# Patient Record
Sex: Male | Born: 1940
Health system: Southern US, Community
[De-identification: ages and names within clinical notes are randomized; demographics above are authoritative.]

## PROBLEM LIST (undated history)

## (undated) DIAGNOSIS — M1990 Unspecified osteoarthritis, unspecified site: Secondary | ICD-10-CM

## (undated) DIAGNOSIS — F419 Anxiety disorder, unspecified: Secondary | ICD-10-CM

## (undated) DIAGNOSIS — K635 Polyp of colon: Secondary | ICD-10-CM

## (undated) DIAGNOSIS — A419 Sepsis, unspecified organism: Secondary | ICD-10-CM

## (undated) DIAGNOSIS — K649 Unspecified hemorrhoids: Secondary | ICD-10-CM

## (undated) DIAGNOSIS — K219 Gastro-esophageal reflux disease without esophagitis: Secondary | ICD-10-CM

## (undated) DIAGNOSIS — N390 Urinary tract infection, site not specified: Secondary | ICD-10-CM

## (undated) DIAGNOSIS — C4490 Unspecified malignant neoplasm of skin, unspecified: Secondary | ICD-10-CM

## (undated) DIAGNOSIS — D649 Anemia, unspecified: Secondary | ICD-10-CM

## (undated) DIAGNOSIS — I2510 Atherosclerotic heart disease of native coronary artery without angina pectoris: Secondary | ICD-10-CM

## (undated) DIAGNOSIS — R0600 Dyspnea, unspecified: Secondary | ICD-10-CM

## (undated) DIAGNOSIS — G709 Myoneural disorder, unspecified: Secondary | ICD-10-CM

## (undated) DIAGNOSIS — I4891 Unspecified atrial fibrillation: Secondary | ICD-10-CM

## (undated) DIAGNOSIS — E119 Type 2 diabetes mellitus without complications: Secondary | ICD-10-CM

## (undated) DIAGNOSIS — H9319 Tinnitus, unspecified ear: Secondary | ICD-10-CM

## (undated) DIAGNOSIS — F329 Major depressive disorder, single episode, unspecified: Secondary | ICD-10-CM

## (undated) DIAGNOSIS — C801 Malignant (primary) neoplasm, unspecified: Secondary | ICD-10-CM

## (undated) DIAGNOSIS — E785 Hyperlipidemia, unspecified: Secondary | ICD-10-CM

## (undated) DIAGNOSIS — F32A Depression, unspecified: Secondary | ICD-10-CM

## (undated) DIAGNOSIS — B019 Varicella without complication: Secondary | ICD-10-CM

## (undated) DIAGNOSIS — I509 Heart failure, unspecified: Secondary | ICD-10-CM

## (undated) DIAGNOSIS — K5730 Diverticulosis of large intestine without perforation or abscess without bleeding: Secondary | ICD-10-CM

## (undated) DIAGNOSIS — R011 Cardiac murmur, unspecified: Secondary | ICD-10-CM

## (undated) DIAGNOSIS — K3189 Other diseases of stomach and duodenum: Secondary | ICD-10-CM

## (undated) DIAGNOSIS — I10 Essential (primary) hypertension: Secondary | ICD-10-CM

## (undated) DIAGNOSIS — G4730 Sleep apnea, unspecified: Secondary | ICD-10-CM

## (undated) HISTORY — DX: Other diseases of stomach and duodenum: K31.89

## (undated) HISTORY — DX: Major depressive disorder, single episode, unspecified: F32.9

## (undated) HISTORY — DX: Varicella without complication: B01.9

## (undated) HISTORY — PX: MELANOMA EXCISION: SHX5266

## (undated) HISTORY — DX: Essential (primary) hypertension: I10

## (undated) HISTORY — DX: Hyperlipidemia, unspecified: E78.5

## (undated) HISTORY — PX: CARDIAC CATHETERIZATION: SHX172

## (undated) HISTORY — DX: Anemia, unspecified: D64.9

## (undated) HISTORY — DX: Unspecified hemorrhoids: K64.9

## (undated) HISTORY — DX: Unspecified malignant neoplasm of skin, unspecified: C44.90

## (undated) HISTORY — DX: Malignant (primary) neoplasm, unspecified: C80.1

## (undated) HISTORY — DX: Urinary tract infection, site not specified: N39.0

## (undated) HISTORY — DX: Tinnitus, unspecified ear: H93.19

## (undated) HISTORY — PX: SPINE SURGERY: SHX786

## (undated) HISTORY — DX: Polyp of colon: K63.5

## (undated) HISTORY — DX: Depression, unspecified: F32.A

## (undated) HISTORY — DX: Anxiety disorder, unspecified: F41.9

## (undated) HISTORY — DX: Type 2 diabetes mellitus without complications: E11.9

## (undated) HISTORY — DX: Diverticulosis of large intestine without perforation or abscess without bleeding: K57.30

## (undated) HISTORY — PX: EYE SURGERY: SHX253

## (undated) HISTORY — DX: Unspecified osteoarthritis, unspecified site: M19.90

## (undated) HISTORY — DX: Myoneural disorder, unspecified: G70.9

## (undated) HISTORY — DX: Cardiac murmur, unspecified: R01.1

## (undated) HISTORY — DX: Heart failure, unspecified: I50.9

## (undated) HISTORY — PX: VASECTOMY: SHX75

---

## 2011-12-06 ENCOUNTER — Ambulatory Visit: Payer: Self-pay | Admitting: Internal Medicine

## 2011-12-10 ENCOUNTER — Ambulatory Visit (INDEPENDENT_AMBULATORY_CARE_PROVIDER_SITE_OTHER): Payer: Medicare Other | Admitting: Internal Medicine

## 2011-12-10 ENCOUNTER — Encounter: Payer: Self-pay | Admitting: Internal Medicine

## 2011-12-10 DIAGNOSIS — E119 Type 2 diabetes mellitus without complications: Secondary | ICD-10-CM | POA: Diagnosis not present

## 2011-12-10 DIAGNOSIS — F329 Major depressive disorder, single episode, unspecified: Secondary | ICD-10-CM

## 2011-12-10 DIAGNOSIS — I10 Essential (primary) hypertension: Secondary | ICD-10-CM | POA: Diagnosis not present

## 2011-12-10 DIAGNOSIS — I724 Aneurysm of artery of lower extremity: Secondary | ICD-10-CM | POA: Diagnosis not present

## 2011-12-10 DIAGNOSIS — E1169 Type 2 diabetes mellitus with other specified complication: Secondary | ICD-10-CM | POA: Insufficient documentation

## 2011-12-10 DIAGNOSIS — I2510 Atherosclerotic heart disease of native coronary artery without angina pectoris: Secondary | ICD-10-CM | POA: Diagnosis not present

## 2011-12-10 DIAGNOSIS — K219 Gastro-esophageal reflux disease without esophagitis: Secondary | ICD-10-CM | POA: Diagnosis not present

## 2011-12-10 DIAGNOSIS — F3289 Other specified depressive episodes: Secondary | ICD-10-CM

## 2011-12-10 DIAGNOSIS — F32A Depression, unspecified: Secondary | ICD-10-CM

## 2011-12-10 DIAGNOSIS — F325 Major depressive disorder, single episode, in full remission: Secondary | ICD-10-CM | POA: Insufficient documentation

## 2011-12-10 DIAGNOSIS — F339 Major depressive disorder, recurrent, unspecified: Secondary | ICD-10-CM | POA: Insufficient documentation

## 2011-12-10 DIAGNOSIS — Z86010 Personal history of colonic polyps: Secondary | ICD-10-CM

## 2011-12-10 DIAGNOSIS — C439 Malignant melanoma of skin, unspecified: Secondary | ICD-10-CM

## 2011-12-10 DIAGNOSIS — Z85820 Personal history of malignant melanoma of skin: Secondary | ICD-10-CM | POA: Insufficient documentation

## 2011-12-10 DIAGNOSIS — E785 Hyperlipidemia, unspecified: Secondary | ICD-10-CM

## 2011-12-10 DIAGNOSIS — Z0000 Encounter for general adult medical examination without abnormal findings: Secondary | ICD-10-CM

## 2011-12-10 LAB — LIPID PANEL
HDL: 32.4 mg/dL — ABNORMAL LOW (ref >39.00)
Triglycerides: 231 mg/dL — ABNORMAL HIGH (ref 0.0–149.0)

## 2011-12-10 LAB — CBC WITH DIFFERENTIAL/PLATELET
Eosinophils Relative: 2.7 % (ref 0.0–5.0)
HCT: 46.5 % (ref 39.0–52.0)
Hemoglobin: 15.3 g/dL (ref 13.0–17.0)
Lymphs Abs: 1.1 10*3/uL (ref 0.7–4.0)
Monocytes Relative: 8.4 % (ref 3.0–12.0)
Neutro Abs: 4.5 10*3/uL (ref 1.4–7.7)
WBC: 6.4 10*3/uL (ref 4.5–10.5)

## 2011-12-10 LAB — TSH: TSH: 0.8 u[IU]/mL (ref 0.35–5.50)

## 2011-12-10 LAB — COMPREHENSIVE METABOLIC PANEL
Alkaline Phosphatase: 56 U/L (ref 39–117)
BUN: 20 mg/dL (ref 6–23)
Glucose, Bld: 99 mg/dL (ref 70–99)
Total Bilirubin: 0.4 mg/dL (ref 0.3–1.2)

## 2011-12-10 LAB — HEMOGLOBIN A1C: Hgb A1c MFr Bld: 6.4 % (ref 4.6–6.5)

## 2011-12-10 MED ORDER — CLOPIDOGREL BISULFATE 75 MG PO TABS: 75.0000 mg | ORAL_TABLET | Freq: Every day | ORAL | Status: DC

## 2011-12-10 MED ORDER — QUINAPRIL HCL 40 MG PO TABS: 40.0000 mg | ORAL_TABLET | Freq: Every day | ORAL | Status: DC

## 2011-12-10 MED ORDER — METFORMIN HCL 500 MG PO TABS: 500.0000 mg | ORAL_TABLET | Freq: Every day | ORAL | Status: DC

## 2011-12-10 MED ORDER — ESOMEPRAZOLE MAGNESIUM 40 MG PO CPDR: 40.0000 mg | DELAYED_RELEASE_CAPSULE | Freq: Every day | ORAL | Status: DC

## 2011-12-10 MED ORDER — PITAVASTATIN CALCIUM 4 MG PO TABS: 4.0000 mg | ORAL_TABLET | Freq: Every day | ORAL | Status: DC

## 2011-12-10 MED ORDER — METOPROLOL SUCCINATE ER 25 MG PO TB24: 25.0000 mg | ORAL_TABLET | Freq: Every day | ORAL | Status: DC

## 2011-12-10 NOTE — Progress Notes (Signed)
Subjective:    Patient ID: Brent Taylor, male    DOB: 01-Mar-1941, 71 y.o.   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 He has a history of coronary artery disease. Apparently due to multiple risk factors he underwent a stress test some years ago which led to a heart catheterization. He was told that he had a vessel with moderate blockage but did not require any mechanical intervention. He has no history of chest pain. Cardiac risk factors include hypertension but he has had for approximately 40 years. He has been on metformin for 2 years for type 2 diabetes. He states that he is followed annually due to a left popliteal artery aneurysm. He denies any claudication. In 2000 he underwent a left mastectomy for a melanoma and has been seen in dermatology annually. He has a history of colonic polyps and his last colonoscopy was in 2001. He has gastroesophageal reflux disease which has been contacted by stricture formation requiring dilatation on 2 or 3 prior occasions. He has a history depression and has been on Prozac for in excess of 10 years. He has never had any trial off this medication.  Social history married 2 children and 4 grandchildren retired Optician, dispensing nonsmoker masters degree level of education Family history father died age 38 he should tobacco use status post CABG Mother died age 55 history of hypertension    Review of Systems  Constitutional: Negative for fever, chills, appetite change and fatigue.  HENT: Negative for hearing loss, ear pain, congestion, sore throat, trouble swallowing, neck stiffness, dental problem, voice change and tinnitus.   Eyes: Negative for pain, discharge and visual disturbance.  Respiratory: Negative for cough, chest tightness, wheezing and stridor.   Cardiovascular: Negative for chest pain, palpitations and leg swelling.  Gastrointestinal: Negative for  nausea, vomiting, abdominal pain, diarrhea, constipation, blood in stool and abdominal distention.  Genitourinary: Negative for urgency, hematuria, flank pain, discharge, difficulty urinating and genital sores.  Musculoskeletal: Negative for myalgias, back pain, joint swelling, arthralgias and gait problem.  Skin: Negative for rash.  Neurological: Negative for dizziness, syncope, speech difficulty, weakness, numbness and headaches.  Hematological: Negative for adenopathy. Does not bruise/bleed easily.  Psychiatric/Behavioral: Negative for behavioral problems and dysphoric mood. The patient is not nervous/anxious.        Objective:   Physical Exam  Constitutional: He appears well-developed and well-nourished.  HENT:  Head: Normocephalic and atraumatic.  Right Ear: External ear normal.  Left Ear: External ear normal.  Nose: Nose normal.  Mouth/Throat: Oropharynx is clear and moist.  Eyes: Conjunctivae and EOM are normal. Pupils are equal, round, and reactive to light. No scleral icterus.  Neck: Normal range of motion. Neck supple. No JVD present. No thyromegaly present.  Cardiovascular: Regular rhythm, normal heart sounds and intact distal pulses.  Exam reveals no gallop and no friction rub.   No murmur heard.      Dorsalis pedis pulses were nonpalpable but posterior tibial pulses were full bilaterally  Pulmonary/Chest: Effort normal and breath sounds normal. He exhibits no tenderness.       Status post left mastectomy  Abdominal: Soft. Bowel sounds are normal. He exhibits no distension and no mass. There is no tenderness.  Genitourinary: Prostate normal and penis normal. Guaiac negative stool.       Prostate +2 enlarged  Musculoskeletal: Normal range of motion. He exhibits no edema and no tenderness.  Lymphadenopathy:  He has no cervical adenopathy.  Neurological: He is alert. He has normal reflexes. No cranial nerve deficit. Coordination normal.  Skin: Skin is warm and dry. No rash  noted.  Psychiatric: He has a normal mood and affect. His behavior is normal.          Assessment & Plan:   Preventive health examination Coronary artery disease. Continue aggressive risk factor modification Hypertension well controlled Type 2 diabetes. We'll check a hemoglobin A1c Dyslipidemia Colonic polyps Gastroesophageal reflux disease with stricture formation. Continue PPI medication   Recheck 3 months weight loss low salt diet and exercise are encouraged

## 2011-12-10 NOTE — Patient Instructions (Signed)
Please check your hemoglobin A1c every 3 months  Limit your sodium (Salt) intake    It is important that you exercise regularly, at least 20 minutes 3 to 4 times per week.  If you develop chest pain or shortness of breath seek  medical attention.  You need to lose weight.  Consider a lower calorie diet and regular exercise.  Avoids foods high in acid such as tomatoes citrus juices, and spicy foods.  Avoid eating within two hours of lying down or before exercising.  Do not overheat.  Try smaller more frequent meals.  If symptoms persist, elevate the head of her bed 12 inches while sleeping.

## 2011-12-14 ENCOUNTER — Encounter: Payer: Self-pay | Admitting: Internal Medicine

## 2011-12-24 ENCOUNTER — Other Ambulatory Visit: Payer: Self-pay

## 2011-12-24 MED ORDER — PITAVASTATIN CALCIUM 4 MG PO TABS: 4.0000 mg | ORAL_TABLET | Freq: Every day | ORAL | Status: DC

## 2011-12-29 ENCOUNTER — Telehealth: Payer: Self-pay

## 2011-12-29 NOTE — Telephone Encounter (Signed)
Pt called - we are finished with recored recv'd from [facility] in [place]. - he will come by and pick up - ok per dr. Amador Cunas for pt to have. KIK

## 2012-01-21 ENCOUNTER — Telehealth: Payer: Self-pay | Admitting: *Deleted

## 2012-01-21 NOTE — Telephone Encounter (Signed)
Noted  

## 2012-01-21 NOTE — Telephone Encounter (Signed)
Pt has history of melanoma, and has found a new mole. We could not get an appt with Derm for 3 months, and wife wants Dr Kirtland Bouchard to look at it and see if he can biopsy it or get an earlier appt with Dermatology. Appt scheduled Monday.

## 2012-01-24 ENCOUNTER — Ambulatory Visit (INDEPENDENT_AMBULATORY_CARE_PROVIDER_SITE_OTHER): Payer: Medicare Other | Admitting: Internal Medicine

## 2012-01-24 ENCOUNTER — Encounter: Payer: Self-pay | Admitting: Internal Medicine

## 2012-01-24 VITALS — BP 130/90 | Temp 98.1°F | Wt 250.0 lb

## 2012-01-24 DIAGNOSIS — E119 Type 2 diabetes mellitus without complications: Secondary | ICD-10-CM

## 2012-01-24 DIAGNOSIS — C439 Malignant melanoma of skin, unspecified: Secondary | ICD-10-CM

## 2012-01-24 DIAGNOSIS — I2510 Atherosclerotic heart disease of native coronary artery without angina pectoris: Secondary | ICD-10-CM

## 2012-01-24 MED ORDER — METOPROLOL SUCCINATE ER 25 MG PO TB24: 25.0000 mg | ORAL_TABLET | Freq: Every day | ORAL | Status: DC

## 2012-01-24 MED ORDER — ESOMEPRAZOLE MAGNESIUM 40 MG PO CPDR: 40.0000 mg | DELAYED_RELEASE_CAPSULE | Freq: Every day | ORAL | Status: DC

## 2012-01-24 MED ORDER — METFORMIN HCL 500 MG PO TABS: 500.0000 mg | ORAL_TABLET | Freq: Every day | ORAL | Status: DC

## 2012-01-24 MED ORDER — PITAVASTATIN CALCIUM 4 MG PO TABS: 4.0000 mg | ORAL_TABLET | Freq: Every day | ORAL | Status: DC

## 2012-01-24 MED ORDER — CLOPIDOGREL BISULFATE 75 MG PO TABS: 75.0000 mg | ORAL_TABLET | Freq: Every day | ORAL | Status: DC

## 2012-01-24 MED ORDER — FLUOXETINE HCL 20 MG PO CAPS: 20.0000 mg | ORAL_CAPSULE | Freq: Every day | ORAL | Status: DC

## 2012-01-24 MED ORDER — QUINAPRIL HCL 40 MG PO TABS: 40.0000 mg | ORAL_TABLET | Freq: Every day | ORAL | Status: DC

## 2012-01-24 NOTE — Progress Notes (Signed)
  Subjective:    Patient ID: Brent Taylor, male    DOB: Oct 27, 1940, 71 y.o.   MRN: [id number]  HPI  71 year old patient who has a prior history of melanoma involving the left anterior chest wall. His wife was concerned about a lesion involving his left posterior back. He is scheduled for a new dermatology evaluation in July of this year. He has coronary artery disease which has been stable. He is treated hypertension and a history of type 2 diabetes. Last hemoglobin A1c 6.4. He has treated dyslipidemia     Review of Systems  Skin: Positive for rash.       Objective:   Physical Exam  Constitutional:       Blood pressure 130/90  Skin: Rash noted.       A slightly raised lesion 4 x 6 mm slightly oval that appeared consistent with a seborrheic dermatosis          Assessment & Plan:  Hypertension Coronary artery disease History melanoma. No clinical suspicion at this time will follow with dermatology in July as scheduled

## 2012-01-24 NOTE — Patient Instructions (Signed)
Dermatology followup as scheduled Return here as scheduled or as needed

## 2012-03-14 ENCOUNTER — Encounter: Payer: Self-pay | Admitting: Internal Medicine

## 2012-03-14 ENCOUNTER — Ambulatory Visit (INDEPENDENT_AMBULATORY_CARE_PROVIDER_SITE_OTHER): Payer: Medicare Other | Admitting: Internal Medicine

## 2012-03-14 VITALS — BP 140/90 | Temp 98.0°F | Wt 250.0 lb

## 2012-03-14 DIAGNOSIS — E119 Type 2 diabetes mellitus without complications: Secondary | ICD-10-CM | POA: Diagnosis not present

## 2012-03-14 DIAGNOSIS — I10 Essential (primary) hypertension: Secondary | ICD-10-CM

## 2012-03-14 DIAGNOSIS — K219 Gastro-esophageal reflux disease without esophagitis: Secondary | ICD-10-CM | POA: Diagnosis not present

## 2012-03-14 DIAGNOSIS — E785 Hyperlipidemia, unspecified: Secondary | ICD-10-CM

## 2012-03-14 DIAGNOSIS — I2510 Atherosclerotic heart disease of native coronary artery without angina pectoris: Secondary | ICD-10-CM | POA: Diagnosis not present

## 2012-03-14 NOTE — Progress Notes (Signed)
  Subjective:    Patient ID: Brent Taylor, male    DOB: April 19, 1941, 71 y.o.   MRN: [id number]  HPI  71 year old patient who is seen today for followup. He has a history of coronary artery disease and hypertension which has been stable. He has type 2 diabetes. 3 months ago hemoglobin A1c 6.4 he maintains very nice glycemic control. His cardiac status has been stable denies any chest pain. This morning he had some very mild epigastric discomfort that has resolved. For the past few days she has had some vague malaise. Laboratory studies were done 3 months ago and were unremarkable. He has treated dyslipidemia    Review of Systems  Constitutional: Negative for fever, chills, appetite change and fatigue.  HENT: Negative for hearing loss, ear pain, congestion, sore throat, trouble swallowing, neck stiffness, dental problem, voice change and tinnitus.   Eyes: Negative for pain, discharge and visual disturbance.  Respiratory: Negative for cough, chest tightness, wheezing and stridor.   Cardiovascular: Negative for chest pain, palpitations and leg swelling.  Gastrointestinal: Positive for abdominal pain. Negative for nausea, vomiting, diarrhea, constipation, blood in stool and abdominal distention.  Genitourinary: Negative for urgency, hematuria, flank pain, discharge, difficulty urinating and genital sores.  Musculoskeletal: Negative for myalgias, back pain, joint swelling, arthralgias and gait problem.  Skin: Negative for rash.  Neurological: Negative for dizziness, syncope, speech difficulty, weakness, numbness and headaches.  Hematological: Negative for adenopathy. Does not bruise/bleed easily.  Psychiatric/Behavioral: Negative for behavioral problems and dysphoric mood. The patient is not nervous/anxious.        Objective:   Physical Exam  Constitutional: He is oriented to person, place, and time. He appears well-developed.  HENT:  Head: Normocephalic.  Right Ear: External ear normal.  Left  Ear: External ear normal.  Eyes: Conjunctivae and EOM are normal.  Neck: Normal range of motion.  Cardiovascular: Normal rate and normal heart sounds.   Pulmonary/Chest: Breath sounds normal.  Abdominal: Soft. Bowel sounds are normal. He exhibits no distension. There is no tenderness. There is no rebound and no guarding.  Musculoskeletal: Normal range of motion. He exhibits no edema and no tenderness.  Neurological: He is alert and oriented to person, place, and time.  Psychiatric: He has a normal mood and affect. His behavior is normal.          Assessment & Plan:   Diabetes mellitus. We'll check a hemoglobin A1c next visit Hypertension stable Fatigue. Short-term possible viral syndrome Will observe Coronary artery disease stable  Recheck 4 months

## 2012-03-14 NOTE — Patient Instructions (Signed)
Limit your sodium (Salt) intake    It is important that you exercise regularly, at least 20 minutes 3 to 4 times per week.  If you develop chest pain or shortness of breath seek  medical attention.  Return in 4 months for follow-up   

## 2012-04-10 DIAGNOSIS — L570 Actinic keratosis: Secondary | ICD-10-CM | POA: Diagnosis not present

## 2012-04-10 DIAGNOSIS — Z85820 Personal history of malignant melanoma of skin: Secondary | ICD-10-CM | POA: Diagnosis not present

## 2012-04-10 DIAGNOSIS — D239 Other benign neoplasm of skin, unspecified: Secondary | ICD-10-CM | POA: Diagnosis not present

## 2012-04-10 DIAGNOSIS — L723 Sebaceous cyst: Secondary | ICD-10-CM | POA: Diagnosis not present

## 2012-04-10 DIAGNOSIS — L821 Other seborrheic keratosis: Secondary | ICD-10-CM | POA: Diagnosis not present

## 2012-07-14 ENCOUNTER — Ambulatory Visit (INDEPENDENT_AMBULATORY_CARE_PROVIDER_SITE_OTHER): Payer: Medicare Other | Admitting: Internal Medicine

## 2012-07-14 ENCOUNTER — Encounter: Payer: Self-pay | Admitting: Internal Medicine

## 2012-07-14 VITALS — BP 120/84 | Temp 98.1°F | Wt 250.0 lb

## 2012-07-14 DIAGNOSIS — R079 Chest pain, unspecified: Secondary | ICD-10-CM | POA: Diagnosis not present

## 2012-07-14 DIAGNOSIS — I2510 Atherosclerotic heart disease of native coronary artery without angina pectoris: Secondary | ICD-10-CM

## 2012-07-14 DIAGNOSIS — E119 Type 2 diabetes mellitus without complications: Secondary | ICD-10-CM | POA: Diagnosis not present

## 2012-07-14 DIAGNOSIS — Z23 Encounter for immunization: Secondary | ICD-10-CM

## 2012-07-14 DIAGNOSIS — E785 Hyperlipidemia, unspecified: Secondary | ICD-10-CM

## 2012-07-14 DIAGNOSIS — I10 Essential (primary) hypertension: Secondary | ICD-10-CM

## 2012-07-14 NOTE — Progress Notes (Signed)
Subjective:    Patient ID: Brent Taylor, male    DOB: [birth year], 71 y.o.   MRN: [id number]  HPI  71 year old patient who is seen today for followup of type 2 diabetes. He has maintained excellent glycemic control on metformin therapy alone. His last hemoglobin A1c 6.4. Fasting blood sugars are generally in the 100 range. History of hypertension as well as dyslipidemia. He has a history of coronary artery disease and approximately 10 years ago underwent a heart catheterization. He was told that he had a single lesion 50%. For the past few weeks he has had 3 episodes of chest pain lasting 10-15 minutes. These have all occurred at rest and were described as indigestion-like. No exertional chest pain. No stress tests since his heart catheterization approximately 10 years ago  Past Medical History  Diagnosis Date  . Cancer     hx - melanoma  . Depression   . Hyperlipidemia   . Hypertension   . UTI (lower urinary tract infection)   . Colon polyps   . Diverticula, colon   . Chicken pox     History   Social History  . Marital Status: Married    Spouse Name: N/A    Number of Children: N/A  . Years of Education: N/A   Occupational History  . Not on file.   Social History Main Topics  . Smoking status: Never Smoker   . Smokeless tobacco: Never Used  . Alcohol Use: No  . Drug Use: No  . Sexually Active: Not on file   Other Topics Concern  . Not on file   Social History Narrative  . No narrative on file    Past Surgical History  Procedure Date  . Melanoma excision     LEFT CHEST  . Melanoma excision     No family history on file.  Allergies  Allergen Reactions  . Lipitor (Atorvastatin Calcium)     Muscle ache    Current Outpatient Prescriptions on File Prior to Visit  Medication Sig Dispense Refill  . clopidogrel (PLAVIX) 75 MG tablet Take 1 tablet (75 mg total) by mouth daily.  90 tablet  6  . esomeprazole (NEXIUM) 40 MG capsule Take 1 capsule (40 mg total) by  mouth daily before breakfast.  90 capsule  6  . FLUoxetine (PROZAC) 20 MG capsule Take 1 capsule (20 mg total) by mouth daily.  90 capsule  1  . metFORMIN (GLUCOPHAGE) 500 MG tablet Take 1 tablet (500 mg total) by mouth daily with breakfast.  90 tablet  6  . metoprolol succinate (TOPROL-XL) 25 MG 24 hr tablet Take 1 tablet (25 mg total) by mouth daily.  90 tablet  6  . Pitavastatin Calcium (LIVALO) 4 MG TABS Take 1 tablet (4 mg total) by mouth daily.  90 tablet  6  . quinapril (ACCUPRIL) 40 MG tablet Take 1 tablet (40 mg total) by mouth daily.  90 tablet  6    BP 120/84  Temp 98.1 F (36.7 C) (Oral)  Wt 250 lb (113.399 kg)       Review of Systems  Constitutional: Negative for fever, chills, appetite change and fatigue.  HENT: Negative for hearing loss, ear pain, congestion, sore throat, trouble swallowing, neck stiffness, dental problem, voice change and tinnitus.   Eyes: Negative for pain, discharge and visual disturbance.  Respiratory: Negative for cough, chest tightness, wheezing and stridor.   Cardiovascular: Positive for chest pain. Negative for palpitations and leg swelling.  Gastrointestinal: Negative for  nausea, vomiting, abdominal pain, diarrhea, constipation, blood in stool and abdominal distention.  Genitourinary: Negative for urgency, hematuria, flank pain, discharge, difficulty urinating and genital sores.  Musculoskeletal: Negative for myalgias, back pain, joint swelling, arthralgias and gait problem.  Skin: Negative for rash.  Neurological: Negative for dizziness, syncope, speech difficulty, weakness, numbness and headaches.  Hematological: Negative for adenopathy. Does not bruise/bleed easily.  Psychiatric/Behavioral: Negative for behavioral problems and dysphoric mood. The patient is not nervous/anxious.        Objective:   Physical Exam  Constitutional: He is oriented to person, place, and time. He appears well-developed.  HENT:  Head: Normocephalic.  Right  Ear: External ear normal.  Left Ear: External ear normal.  Eyes: Conjunctivae normal and EOM are normal.  Neck: Normal range of motion.  Cardiovascular: Normal rate and normal heart sounds.   Pulmonary/Chest: Breath sounds normal.  Abdominal: Bowel sounds are normal.  Musculoskeletal: Normal range of motion. He exhibits no edema and no tenderness.  Neurological: He is alert and oriented to person, place, and time.  Psychiatric: He has a normal mood and affect. His behavior is normal.          Assessment & Plan:   Coronary artery disease. Non exertional chest pain we'll schedule a ambulatory nuclear stress test for risk stratification. Will moderate his physical activities until the stress test has been performed Diabetes mellitus. Check hemoglobin A1c appears to be under excellent control Hypertension stable Dyslipidemia. Continue statin therapy  CPX 6 months He will report any further chest pain

## 2012-07-14 NOTE — Patient Instructions (Addendum)
cardiac stress test as discussed  Limit your sodium (Salt) intake    It is important that you exercise regularly, at least 20 minutes 3 to 4 times per week.  If you develop chest pain or shortness of breath seek  medical attention.  Moderate your physical activities until stress test has been performed  Return in 6 months for follow-up

## 2012-07-25 ENCOUNTER — Ambulatory Visit (HOSPITAL_COMMUNITY): Payer: Medicare Other | Attending: Cardiovascular Disease | Admitting: Radiology

## 2012-07-25 VITALS — BP 140/82 | Ht 72.0 in | Wt 246.0 lb

## 2012-07-25 DIAGNOSIS — R002 Palpitations: Secondary | ICD-10-CM | POA: Diagnosis not present

## 2012-07-25 DIAGNOSIS — R42 Dizziness and giddiness: Secondary | ICD-10-CM | POA: Insufficient documentation

## 2012-07-25 DIAGNOSIS — I2510 Atherosclerotic heart disease of native coronary artery without angina pectoris: Secondary | ICD-10-CM

## 2012-07-25 DIAGNOSIS — I491 Atrial premature depolarization: Secondary | ICD-10-CM

## 2012-07-25 DIAGNOSIS — R079 Chest pain, unspecified: Secondary | ICD-10-CM

## 2012-07-25 DIAGNOSIS — I10 Essential (primary) hypertension: Secondary | ICD-10-CM | POA: Insufficient documentation

## 2012-07-25 DIAGNOSIS — E119 Type 2 diabetes mellitus without complications: Secondary | ICD-10-CM | POA: Diagnosis not present

## 2012-07-25 MED ORDER — TECHNETIUM TC 99M SESTAMIBI GENERIC - CARDIOLITE
10.0000 | Freq: Once | INTRAVENOUS | Status: AC | PRN
  Administered 2012-07-25: 10 via INTRAVENOUS

## 2012-07-25 MED ORDER — TECHNETIUM TC 99M SESTAMIBI GENERIC - CARDIOLITE
30.0000 | Freq: Once | INTRAVENOUS | Status: AC | PRN
  Administered 2012-07-25: 30 via INTRAVENOUS

## 2012-07-25 NOTE — Progress Notes (Signed)
[facility] SITE 3 NUCLEAR MED [address] [id number] [place] [address] [phone]  Cardiology Nuclear Med Study  Brent Taylor is a 71 y.o. male     MRN : [id number]     DOB: 1940/11/04  Procedure Date: 07/25/2012  Nuclear Med Background Indication for Stress Test:  Evaluation for Ischemia History:  10 yrs ago Heart Cath: Single Lesion 50% Cardiac Risk Factors: Hypertension, Lipids and NIDDM  Symptoms:  Chest Pain, Fatigue, Light-Headedness and Palpitations   Nuclear Pre-Procedure Caffeine/Decaff Intake:  None > 12 hrs NPO After: 6:00pm   Lungs:  clear O2 Sat: 95% on room air. IV 0.9% NS with Angio Cath:  22g  IV Site: R Wrist x 1, tolerated well IV Started by:  Irean Hong, RN  Chest Size (in):  48 Cup Size: n/a  Height: 6' (1.829 m)  Weight:  246 lb (111.585 kg)  BMI:  Body mass index is 33.36 kg/(m^2). Tech Comments:  Held Toprol x 24 hrs, no metformin today    Nuclear Med Study 1 or 2 day study: 1 day  Stress Test Type:  Stress  Reading MD: Charlton Haws, MD  Order Authorizing Provider:  Eleonore Chiquito, MD  Resting Radionuclide: Technetium 92m Sestamibi  Resting Radionuclide Dose: 11.0 mCi   Stress Radionuclide:  Technetium 85m Sestamibi  Stress Radionuclide Dose: 33.0 mCi           Stress Protocol Rest HR: 146 Stress HR: 146  Rest BP: 140/82 Stress BP: 188/98  Exercise Time (min): 6:00 METS: 7.0   Predicted Max HR: 147 bpm % Max HR: 99.32 bpm Rate Pressure Product: 10272   Dose of Adenosine (mg):  n/a Dose of Lexiscan: n/a mg  Dose of Atropine (mg): n/a Dose of Dobutamine: n/a mcg/kg/min (at max HR)  Stress Test Technologist: Milana Na, EMT-P  Nuclear Technologist:  Domenic Polite, CNMT     Rest Procedure:  Myocardial perfusion imaging was performed at rest 45 minutes following the intravenous administration of Technetium 69m Sestamibi. Rest ECG: Sinus Bradycardia with PACS, PVCS, and a run of PAT seen not caught in  pretest  Stress Procedure:  The patient performed treadmill exercise using a Bruce  Protocol for 6:00 minutes. The patient stopped due to fatigue and denied any chest pain.  There were non specific ST-T wave changes and freq pacs/occ pvcs with exercise.  Technetium 85m Sestamibi was injected at peak exercise and myocardial perfusion imaging was performed after a brief delay. Stress ECG: Non specific ST, T changes  QPS Raw Data Images:  Normal; no motion artifact; normal heart/lung ratio. Stress Images:  There is decreased uptake in the inferior wall. Rest Images:  There is decreased uptake in the inferior wall. Subtraction (SDS):  There is a fixed defect that is most consistent with a previous infarction. Transient Ischemic Dilatation (Normal <1.22):  0.94 Lung/Heart Ratio (Normal <0.45):  0.42  Quantitative Gated Spect Images QGS EDV:  120 ml QGS ESV:  56 ml  Impression Exercise Capacity:  Fair exercise capacity. BP Response:  Normal blood pressure response. Clinical Symptoms:  Fatigue ECG Impression:  Baseline ECG with biphasic T waves in inferolateral leads Comparison with Prior Nuclear Study: No images to compare  Overall Impression:  Low risk stress nuclear study. Small inferobasal wall infarct with no ischemia  LV Ejection Fraction: 54%.  LV Wall Motion:  NL LV Function; NL Wall Motion    Charlton Haws

## 2012-07-27 NOTE — Progress Notes (Signed)
Quick Note:  spke with pt- informed of results - no further questions ______

## 2012-07-27 NOTE — Progress Notes (Signed)
Quick Note:  Attempt to call- no ans no mach. Will try later ______

## 2012-08-10 DIAGNOSIS — S41109A Unspecified open wound of unspecified upper arm, initial encounter: Secondary | ICD-10-CM | POA: Diagnosis not present

## 2012-08-26 DIAGNOSIS — H04129 Dry eye syndrome of unspecified lacrimal gland: Secondary | ICD-10-CM | POA: Diagnosis not present

## 2012-09-05 ENCOUNTER — Ambulatory Visit (INDEPENDENT_AMBULATORY_CARE_PROVIDER_SITE_OTHER): Payer: Medicare Other | Admitting: Internal Medicine

## 2012-09-05 ENCOUNTER — Encounter: Payer: Self-pay | Admitting: Internal Medicine

## 2012-09-05 VITALS — BP 118/80 | HR 64 | Temp 97.9°F | Resp 18 | Wt 254.0 lb

## 2012-09-05 DIAGNOSIS — I2510 Atherosclerotic heart disease of native coronary artery without angina pectoris: Secondary | ICD-10-CM

## 2012-09-05 DIAGNOSIS — Z299 Encounter for prophylactic measures, unspecified: Secondary | ICD-10-CM | POA: Diagnosis not present

## 2012-09-05 DIAGNOSIS — E119 Type 2 diabetes mellitus without complications: Secondary | ICD-10-CM | POA: Diagnosis not present

## 2012-09-05 DIAGNOSIS — I10 Essential (primary) hypertension: Secondary | ICD-10-CM

## 2012-09-05 LAB — POCT URINALYSIS DIPSTICK
Bilirubin, UA: 2
Blood, UA: NEGATIVE
Glucose, UA: NEGATIVE
Spec Grav, UA: 1.025
Urobilinogen, UA: 0.2

## 2012-09-05 NOTE — Patient Instructions (Signed)
Limit your sodium (Salt) intake    It is important that you exercise regularly, at least 20 minutes 3 to 4 times per week.  If you develop chest pain or shortness of breath seek  medical attention.   Please check your hemoglobin A1c every 3 months   

## 2012-09-05 NOTE — Progress Notes (Signed)
Subjective:    Patient ID: Brent Taylor, male    DOB: 19-Dec-1940, 71 y.o.   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 He was seen last month and hemoglobin A1c was well controlled. He has a history of stable CAD diabetes and hypertension. He feels quite well today without concerns or complaints.  Past Medical History  Diagnosis Date  . Cancer     hx - melanoma  . Depression   . Hyperlipidemia   . Hypertension   . UTI (lower urinary tract infection)   . Colon polyps   . Diverticula, colon   . Chicken pox     History   Social History  . Marital Status: Married    Spouse Name: N/A    Number of Children: N/A  . Years of Education: N/A   Occupational History  . Not on file.   Social History Main Topics  . Smoking status: Never Smoker   . Smokeless tobacco: Never Used  . Alcohol Use: No  . Drug Use: No  . Sexually Active: Not on file   Other Topics Concern  . Not on file   Social History Narrative  . No narrative on file    Past Surgical History  Procedure Date  . Melanoma excision     LEFT CHEST  . Melanoma excision     No family history on file.  Allergies  Allergen Reactions  . Lipitor (Atorvastatin Calcium)     Muscle ache    Current Outpatient Prescriptions on File Prior to Visit  Medication Sig Dispense Refill  . clopidogrel (PLAVIX) 75 MG tablet Take 1 tablet (75 mg total) by mouth daily.  90 tablet  6  . esomeprazole (NEXIUM) 40 MG capsule Take 1 capsule (40 mg total) by mouth daily before breakfast.  90 capsule  6  . FLUoxetine (PROZAC) 20 MG capsule Take 1 capsule (20 mg total) by mouth daily.  90 capsule  1  . metFORMIN (GLUCOPHAGE) 500 MG tablet Take 1 tablet (500 mg total) by mouth daily with breakfast.  90 tablet  6  . metoprolol succinate (TOPROL-XL) 25 MG 24 hr tablet Take 1 tablet (25 mg total) by mouth daily.  90 tablet  6  . Pitavastatin Calcium (LIVALO) 4 MG TABS Take 1 tablet (4 mg total) by mouth  daily.  90 tablet  6  . quinapril (ACCUPRIL) 40 MG tablet Take 1 tablet (40 mg total) by mouth daily.  90 tablet  6    BP 118/80  Pulse 64  Temp 97.9 F (36.6 C) (Oral)  Resp 18  Wt 254 lb (115.214 kg)       Review of Systems  Constitutional: Negative for fever, chills, appetite change and fatigue.  HENT: Negative for hearing loss, ear pain, congestion, sore throat, trouble swallowing, neck stiffness, dental problem, voice change and tinnitus.   Eyes: Negative for pain, discharge and visual disturbance.  Respiratory: Negative for cough, chest tightness, wheezing and stridor.   Cardiovascular: Negative for chest pain, palpitations and leg swelling.  Gastrointestinal: Negative for nausea, vomiting, abdominal pain, diarrhea, constipation, blood in stool and abdominal distention.  Genitourinary: Negative for urgency, hematuria, flank pain, discharge, difficulty urinating and genital sores.  Musculoskeletal: Negative for myalgias, back pain, joint swelling, arthralgias and gait problem.  Skin: Negative for rash.  Neurological: Negative for dizziness, syncope, speech difficulty, weakness, numbness and headaches.  Hematological: Negative for adenopathy. Does not bruise/bleed easily.  Psychiatric/Behavioral: Negative  for behavioral problems and dysphoric mood. The patient is not nervous/anxious.        Objective:   Physical Exam  Constitutional: He is oriented to person, place, and time. He appears well-developed.  HENT:  Head: Normocephalic.  Right Ear: External ear normal.  Left Ear: External ear normal.  Eyes: Conjunctivae normal and EOM are normal.  Neck: Normal range of motion.  Cardiovascular: Normal rate and normal heart sounds.   Pulmonary/Chest: Breath sounds normal.  Abdominal: Bowel sounds are normal.  Musculoskeletal: Normal range of motion. He exhibits no edema and no tenderness.  Neurological: He is alert and oriented to person, place, and time.  Psychiatric: He  has a normal mood and affect. His behavior is normal.          Assessment & Plan:   Diabetes mellitus stable Hypertension well controlled Coronary artery disease. Stable asymptomatic single vessel disease  DOT forms completed We'll check a UA Return office visit 3 months

## 2012-09-29 ENCOUNTER — Other Ambulatory Visit: Payer: Self-pay | Admitting: Internal Medicine

## 2012-12-18 ENCOUNTER — Other Ambulatory Visit: Payer: Self-pay | Admitting: Internal Medicine

## 2013-01-12 ENCOUNTER — Encounter: Payer: Self-pay | Admitting: Internal Medicine

## 2013-01-12 ENCOUNTER — Ambulatory Visit (INDEPENDENT_AMBULATORY_CARE_PROVIDER_SITE_OTHER): Payer: Medicare Other | Admitting: Internal Medicine

## 2013-01-12 VITALS — BP 146/90 | HR 45 | Temp 98.3°F | Resp 18 | Ht 71.0 in | Wt 253.0 lb

## 2013-01-12 DIAGNOSIS — I2510 Atherosclerotic heart disease of native coronary artery without angina pectoris: Secondary | ICD-10-CM | POA: Diagnosis not present

## 2013-01-12 DIAGNOSIS — I10 Essential (primary) hypertension: Secondary | ICD-10-CM | POA: Diagnosis not present

## 2013-01-12 DIAGNOSIS — K219 Gastro-esophageal reflux disease without esophagitis: Secondary | ICD-10-CM

## 2013-01-12 DIAGNOSIS — Z0000 Encounter for general adult medical examination without abnormal findings: Secondary | ICD-10-CM

## 2013-01-12 DIAGNOSIS — F3289 Other specified depressive episodes: Secondary | ICD-10-CM

## 2013-01-12 DIAGNOSIS — E119 Type 2 diabetes mellitus without complications: Secondary | ICD-10-CM | POA: Diagnosis not present

## 2013-01-12 DIAGNOSIS — E785 Hyperlipidemia, unspecified: Secondary | ICD-10-CM | POA: Diagnosis not present

## 2013-01-12 DIAGNOSIS — F329 Major depressive disorder, single episode, unspecified: Secondary | ICD-10-CM

## 2013-01-12 DIAGNOSIS — F32A Depression, unspecified: Secondary | ICD-10-CM

## 2013-01-12 LAB — CBC WITH DIFFERENTIAL/PLATELET
Basophils Relative: 0.4 % (ref 0.0–3.0)
Eosinophils Absolute: 0.2 10*3/uL (ref 0.0–0.7)
Eosinophils Relative: 4.7 % (ref 0.0–5.0)
HCT: 45.3 % (ref 39.0–52.0)
Lymphs Abs: 1.1 10*3/uL (ref 0.7–4.0)
MCHC: 32.8 g/dL (ref 30.0–36.0)
MCV: 88.4 fl (ref 78.0–100.0)
Monocytes Absolute: 0.5 10*3/uL (ref 0.1–1.0)
RBC: 5.12 Mil/uL (ref 4.22–5.81)
WBC: 5.2 10*3/uL (ref 4.5–10.5)

## 2013-01-12 LAB — COMPREHENSIVE METABOLIC PANEL
AST: 39 U/L — ABNORMAL HIGH (ref 0–37)
Albumin: 3.9 g/dL (ref 3.5–5.2)
BUN: 16 mg/dL (ref 6–23)
CO2: 28 mEq/L (ref 19–32)
Calcium: 8.5 mg/dL (ref 8.4–10.5)
Chloride: 104 mEq/L (ref 96–112)
GFR: 64.11 mL/min (ref >60.00)
Glucose, Bld: 94 mg/dL (ref 70–99)
Potassium: 4.6 mEq/L (ref 3.5–5.1)

## 2013-01-12 LAB — TSH: TSH: 1.77 u[IU]/mL (ref 0.35–5.50)

## 2013-01-12 LAB — LIPID PANEL
Cholesterol: 126 mg/dL (ref 0–200)
Triglycerides: 130 mg/dL (ref 0.0–149.0)
VLDL: 26 mg/dL (ref 0.0–40.0)

## 2013-01-12 MED ORDER — PITAVASTATIN CALCIUM 4 MG PO TABS: 4.0000 mg | ORAL_TABLET | Freq: Every day | ORAL | Status: DC

## 2013-01-12 MED ORDER — CLOPIDOGREL BISULFATE 75 MG PO TABS: 75.0000 mg | ORAL_TABLET | Freq: Every day | ORAL | Status: DC

## 2013-01-12 MED ORDER — METFORMIN HCL 500 MG PO TABS: 500.0000 mg | ORAL_TABLET | Freq: Every day | ORAL | Status: DC

## 2013-01-12 MED ORDER — ESOMEPRAZOLE MAGNESIUM 40 MG PO CPDR: 40.0000 mg | DELAYED_RELEASE_CAPSULE | Freq: Every day | ORAL | Status: DC

## 2013-01-12 MED ORDER — METOPROLOL SUCCINATE ER 25 MG PO TB24: 25.0000 mg | ORAL_TABLET | Freq: Every day | ORAL | Status: DC

## 2013-01-12 MED ORDER — FLUOXETINE HCL 20 MG PO CAPS: ORAL_CAPSULE | ORAL | Status: DC

## 2013-01-12 MED ORDER — HYDROCHLOROTHIAZIDE 25 MG PO TABS: 25.0000 mg | ORAL_TABLET | Freq: Every day | ORAL | Status: DC

## 2013-01-12 MED ORDER — PRAVASTATIN SODIUM 40 MG PO TABS: 40.0000 mg | ORAL_TABLET | Freq: Every day | ORAL | Status: DC

## 2013-01-12 MED ORDER — QUINAPRIL HCL 40 MG PO TABS: 40.0000 mg | ORAL_TABLET | Freq: Every day | ORAL | Status: DC

## 2013-01-12 NOTE — Patient Instructions (Addendum)
Limit your sodium (Salt) intake    It is important that you exercise regularly, at least 20 minutes 3 to 4 times per week.  If you develop chest pain or shortness of breath seek  medical attention.  You need to lose weight.  Consider a lower calorie diet and regular exercise.  Return in 3months for follow-up   Please check your blood pressure on a regular basis.  If it is consistently greater than 150/90, please make an office appointment.  Take metoprolol every other day for 2 weeks and then discontinue

## 2013-01-12 NOTE — Progress Notes (Signed)
Patient ID: Brent Taylor, male   DOB: 1941-06-02, 72 y.o.   MRN: [id number]  Subjective:    Patient ID: Brent Taylor, male    DOB: 12-19-1940, 72 y.o.   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 He has a history of coronary artery disease. Apparently due to multiple risk factors he underwent a stress test some years ago which led to a heart catheterization. He was told that he had a vessel with moderate blockage but did not require any mechanical intervention. He has no history of chest pain. Cardiac risk factors include hypertension but he has had for approximately 40 years. He has been on metformin for 2 years for type 2 diabetes. He states that he is followed annually due to a left popliteal artery aneurysm.  Medical records were reviewed. He had an  Ectatic left popliteal artery but no focal aneurysm. He denies any claudication. In 2000 he underwent a left mastectomy for a melanoma and has been seen in dermatology annually. He has a history of colonic polyps and his last colonoscopy was in 2001. He has gastroesophageal reflux disease which has been contacted by stricture formation requiring dilatation on 2 or 3 prior occasions. He has a history depression and has been on Prozac for in excess of 10 years. He has never had any trial off this medication.  Social history married 2 children and 4 grandchildren retired Optician, dispensing nonsmoker masters degree level of education Family history father died age 51 he should tobacco use status post CABG Mother died age 51 history of hypertension  1. Risk factors, based on past  M,S,F history-  patient has known moderate single-vessel coronary artery disease which remains asymptomatic. Card*factors include hypertension diabetes and dyslipidemia   2.  Physical activities: [facility] No exertional chest pain or symptoms of claudication    3.  Depression/mood: history of depression which has been stable  4.  Hearing: no deficits   5.  ADL's:independent in all aspects of daily living   6.  Fall risk: low  7.  Home safety: no problems identified   8.  Height weight, and visual acuity;height and weight stable no change in visual acuity has had a recent eye examination  9.  Counseling:  10. Lab orders based on risk factors: laboratory profile including lipid panel and hemoglobin A1c will be reviewed   11. Referral : not appropriate at this time   12. Care plan: heart healthy diet regular exercise and modest weight loss all encouraged  13. Cognitive assessment:  alert and oriented with normal affect. No cognitive dysfunction       Review of Systems  Constitutional: Negative for fever, chills, appetite change and fatigue.  HENT: Negative for hearing loss, ear pain, congestion, sore throat, trouble swallowing, neck stiffness, dental problem, voice change and tinnitus.   Eyes: Negative for pain, discharge and visual disturbance.  Respiratory: Negative for cough, chest tightness, wheezing and stridor.   Cardiovascular: Negative for chest pain, palpitations and leg swelling.  Gastrointestinal: Negative for nausea, vomiting, abdominal pain, diarrhea, constipation, blood in stool and abdominal distention.  Genitourinary: Negative for urgency, hematuria, flank pain, discharge, difficulty urinating and genital sores.  Musculoskeletal: Negative for myalgias, back pain, joint swelling, arthralgias and gait problem.  Skin: Negative for rash.  Neurological: Negative for dizziness, syncope, speech difficulty, weakness, numbness and headaches.  Hematological: Negative for adenopathy.  Does not bruise/bleed easily.  Psychiatric/Behavioral: Negative for behavioral problems and dysphoric mood. The patient is not nervous/anxious.        Objective:   Physical Exam  Constitutional: He appears well-developed and well-nourished.   HENT:  Head: Normocephalic and atraumatic.  Right Ear: External ear normal.  Left Ear: External ear normal.  Nose: Nose normal.  Mouth/Throat: Oropharynx is clear and moist.  Eyes: Conjunctivae and EOM are normal. Pupils are equal, round, and reactive to light. No scleral icterus.  Neck: Normal range of motion. Neck supple. No JVD present. No thyromegaly present.  Cardiovascular: Regular rhythm, normal heart sounds and intact distal pulses.  Exam reveals no gallop and no friction rub.   No murmur heard. Dorsalis pedis pulses were nonpalpable but posterior tibial pulses were full bilaterally  Pulmonary/Chest: Effort normal and breath sounds normal. He exhibits no tenderness.  Status post left mastectomy  Abdominal: Soft. Bowel sounds are normal. He exhibits no distension and no mass. There is no tenderness.  Genitourinary: Prostate normal and penis normal. Guaiac negative stool.  Prostate +2 enlarged  Musculoskeletal: Normal range of motion. He exhibits no edema and no tenderness.  Lymphadenopathy:    He has no cervical adenopathy.  Neurological: He is alert. He has normal reflexes. No cranial nerve deficit. Coordination normal.  Skin: Skin is warm and dry. No rash noted.  Psychiatric: He has a normal mood and affect. His behavior is normal.          Assessment & Plan:   Preventive health examination Coronary artery disease. Continue aggressive risk factor modification Hypertension well controlled Type 2 diabetes. We'll check a hemoglobin A1c Dyslipidemia Colonic polyps Gastroesophageal reflux disease with stricture formation. Continue PPI medication Sinus bradycardia. We'll taper and discontinue low dose metoprolol and substitute hydrochlorothiazide   Recheck 6 months weight loss low salt diet and exercise are encouraged

## 2013-02-13 ENCOUNTER — Other Ambulatory Visit: Payer: Self-pay | Admitting: Internal Medicine

## 2013-03-01 ENCOUNTER — Other Ambulatory Visit: Payer: Self-pay | Admitting: Internal Medicine

## 2013-04-05 ENCOUNTER — Other Ambulatory Visit: Payer: Self-pay | Admitting: Internal Medicine

## 2013-04-11 DIAGNOSIS — L909 Atrophic disorder of skin, unspecified: Secondary | ICD-10-CM | POA: Diagnosis not present

## 2013-04-11 DIAGNOSIS — L821 Other seborrheic keratosis: Secondary | ICD-10-CM | POA: Diagnosis not present

## 2013-04-11 DIAGNOSIS — C44621 Squamous cell carcinoma of skin of unspecified upper limb, including shoulder: Secondary | ICD-10-CM | POA: Diagnosis not present

## 2013-04-11 DIAGNOSIS — L919 Hypertrophic disorder of the skin, unspecified: Secondary | ICD-10-CM | POA: Diagnosis not present

## 2013-04-11 DIAGNOSIS — L570 Actinic keratosis: Secondary | ICD-10-CM | POA: Diagnosis not present

## 2013-04-11 DIAGNOSIS — L280 Lichen simplex chronicus: Secondary | ICD-10-CM | POA: Diagnosis not present

## 2013-04-11 DIAGNOSIS — C44320 Squamous cell carcinoma of skin of unspecified parts of face: Secondary | ICD-10-CM | POA: Diagnosis not present

## 2013-04-11 DIAGNOSIS — Z85820 Personal history of malignant melanoma of skin: Secondary | ICD-10-CM | POA: Diagnosis not present

## 2013-04-11 DIAGNOSIS — D234 Other benign neoplasm of skin of scalp and neck: Secondary | ICD-10-CM | POA: Diagnosis not present

## 2013-05-16 ENCOUNTER — Other Ambulatory Visit: Payer: Self-pay

## 2013-05-31 DIAGNOSIS — Z85828 Personal history of other malignant neoplasm of skin: Secondary | ICD-10-CM | POA: Diagnosis not present

## 2013-05-31 DIAGNOSIS — Z85820 Personal history of malignant melanoma of skin: Secondary | ICD-10-CM | POA: Diagnosis not present

## 2013-05-31 DIAGNOSIS — C44621 Squamous cell carcinoma of skin of unspecified upper limb, including shoulder: Secondary | ICD-10-CM | POA: Diagnosis not present

## 2013-07-16 ENCOUNTER — Ambulatory Visit (INDEPENDENT_AMBULATORY_CARE_PROVIDER_SITE_OTHER): Payer: Medicare Other | Admitting: Internal Medicine

## 2013-07-16 ENCOUNTER — Encounter: Payer: Self-pay | Admitting: Internal Medicine

## 2013-07-16 VITALS — BP 118/80 | HR 71 | Temp 98.4°F | Resp 20 | Wt 250.0 lb

## 2013-07-16 DIAGNOSIS — I2510 Atherosclerotic heart disease of native coronary artery without angina pectoris: Secondary | ICD-10-CM | POA: Diagnosis not present

## 2013-07-16 DIAGNOSIS — E119 Type 2 diabetes mellitus without complications: Secondary | ICD-10-CM | POA: Diagnosis not present

## 2013-07-16 DIAGNOSIS — I10 Essential (primary) hypertension: Secondary | ICD-10-CM | POA: Diagnosis not present

## 2013-07-16 DIAGNOSIS — E785 Hyperlipidemia, unspecified: Secondary | ICD-10-CM

## 2013-07-16 DIAGNOSIS — Z23 Encounter for immunization: Secondary | ICD-10-CM | POA: Diagnosis not present

## 2013-07-16 MED ORDER — QUINAPRIL HCL 40 MG PO TABS: ORAL_TABLET | ORAL | Status: DC

## 2013-07-16 MED ORDER — HYDROCHLOROTHIAZIDE 25 MG PO TABS: 25.0000 mg | ORAL_TABLET | Freq: Every day | ORAL | Status: DC

## 2013-07-16 MED ORDER — METFORMIN HCL 500 MG PO TABS: ORAL_TABLET | ORAL | Status: DC

## 2013-07-16 MED ORDER — PRAVASTATIN SODIUM 40 MG PO TABS: 40.0000 mg | ORAL_TABLET | Freq: Every day | ORAL | Status: DC

## 2013-07-16 NOTE — Patient Instructions (Signed)
Please see your eye doctor yearly to check for diabetic eye damage  Limit your sodium (Salt) intake    It is important that you exercise regularly, at least 20 minutes 3 to 4 times per week.  If you develop chest pain or shortness of breath seek  medical attention.   Please check your hemoglobin A1c every 3 months  You need to lose weight.  Consider a lower calorie diet and regular exercise.

## 2013-07-16 NOTE — Progress Notes (Signed)
Subjective:    Patient ID: Brent Taylor, male    DOB: Oct 16, 1940, 72 y.o.   MRN: [id number]  HPI  72 year old patient who is seen today for his six-month followup Medical problems include coronary artery disease type 2 diabetes hypertension and dyslipidemia Denies any cardiopulmonary complaints Hemoglobin A1c is urgently in the low 6 range He has been out of the number of medications however. All medications refilled today. It has been about one year since his last eye exam.  Past Medical History  Diagnosis Date  . Cancer     hx - melanoma  . Depression   . Hyperlipidemia   . Hypertension   . UTI (lower urinary tract infection)   . Colon polyps   . Diverticula, colon   . Chicken pox     History   Social History  . Marital Status: Married    Spouse Name: N/A    Number of Children: N/A  . Years of Education: N/A   Occupational History  . Not on file.   Social History Main Topics  . Smoking status: Never Smoker   . Smokeless tobacco: Never Used  . Alcohol Use: No  . Drug Use: No  . Sexual Activity: Not on file   Other Topics Concern  . Not on file   Social History Narrative  . No narrative on file    Past Surgical History  Procedure Laterality Date  . Melanoma excision      LEFT CHEST  . Melanoma excision      No family history on file.  Allergies  Allergen Reactions  . Lipitor [Atorvastatin Calcium]     Muscle ache    Current Outpatient Prescriptions on File Prior to Visit  Medication Sig Dispense Refill  . clopidogrel (PLAVIX) 75 MG tablet TAKE ONE TABLET (75MG  TOTAL) BY MOUTH DAILY  90 tablet  3  . FLUoxetine (PROZAC) 20 MG capsule TAKE ONE CAPSULE (20 MG TOTAL) BY MOUTH DAILY.  90 capsule  5   No current facility-administered medications on file prior to visit.    BP 118/80  Pulse 71  Temp(Src) 98.4 F (36.9 C) (Oral)  Resp 20  Wt 250 lb (113.399 kg)  BMI 34.88 kg/m2  SpO2 95%       Review of Systems  Constitutional: Negative for  fever, chills, appetite change and fatigue.  HENT: Negative for hearing loss, ear pain, congestion, sore throat, trouble swallowing, neck stiffness, dental problem, voice change and tinnitus.   Eyes: Negative for pain, discharge and visual disturbance.  Respiratory: Negative for cough, chest tightness, wheezing and stridor.   Cardiovascular: Negative for chest pain, palpitations and leg swelling.  Gastrointestinal: Negative for nausea, vomiting, abdominal pain, diarrhea, constipation, blood in stool and abdominal distention.  Genitourinary: Negative for urgency, hematuria, flank pain, discharge, difficulty urinating and genital sores.  Musculoskeletal: Positive for arthralgias (Bilateral hip pain) and gait problem. Negative for myalgias, back pain and joint swelling.  Skin: Negative for rash.  Neurological: Negative for dizziness, syncope, speech difficulty, weakness, numbness and headaches.  Hematological: Negative for adenopathy. Does not bruise/bleed easily.  Psychiatric/Behavioral: Negative for behavioral problems and dysphoric mood. The patient is not nervous/anxious.        Objective:   Physical Exam  Constitutional: He is oriented to person, place, and time. He appears well-developed.  HENT:  Head: Normocephalic.  Right Ear: External ear normal.  Left Ear: External ear normal.  Eyes: Conjunctivae and EOM are normal.  Neck: Normal range of motion.  Cardiovascular: Normal rate and normal heart sounds.   Pulmonary/Chest: Breath sounds normal.  Abdominal: Bowel sounds are normal.  Musculoskeletal: Normal range of motion. He exhibits no edema and no tenderness.  Neurological: He is alert and oriented to person, place, and time.  Psychiatric: He has a normal mood and affect. His behavior is normal.          Assessment & Plan:   Diabetes. Well controlled. We'll check a hemoglobin A1c Hypertension stable Dyslipidemia. Pravastatin refill. Compliance stressed Coronary artery  disease. Continue present regimen. Remains asymptomatic

## 2013-09-24 DIAGNOSIS — H26499 Other secondary cataract, unspecified eye: Secondary | ICD-10-CM | POA: Diagnosis not present

## 2013-12-14 DIAGNOSIS — H26499 Other secondary cataract, unspecified eye: Secondary | ICD-10-CM | POA: Diagnosis not present

## 2013-12-17 ENCOUNTER — Other Ambulatory Visit: Payer: Self-pay | Admitting: *Deleted

## 2013-12-17 MED ORDER — CLOPIDOGREL BISULFATE 75 MG PO TABS: ORAL_TABLET | ORAL | Status: DC

## 2013-12-17 MED ORDER — PRAVASTATIN SODIUM 40 MG PO TABS: 40.0000 mg | ORAL_TABLET | Freq: Every day | ORAL | Status: DC

## 2013-12-25 DIAGNOSIS — Z85820 Personal history of malignant melanoma of skin: Secondary | ICD-10-CM | POA: Diagnosis not present

## 2013-12-25 DIAGNOSIS — Z85828 Personal history of other malignant neoplasm of skin: Secondary | ICD-10-CM | POA: Diagnosis not present

## 2013-12-25 DIAGNOSIS — L821 Other seborrheic keratosis: Secondary | ICD-10-CM | POA: Diagnosis not present

## 2014-01-03 DIAGNOSIS — N159 Renal tubulo-interstitial disease, unspecified: Secondary | ICD-10-CM | POA: Diagnosis not present

## 2014-01-03 DIAGNOSIS — R300 Dysuria: Secondary | ICD-10-CM | POA: Diagnosis not present

## 2014-01-08 ENCOUNTER — Encounter: Payer: Self-pay | Admitting: Internal Medicine

## 2014-01-08 ENCOUNTER — Ambulatory Visit (INDEPENDENT_AMBULATORY_CARE_PROVIDER_SITE_OTHER): Payer: Medicare Other | Admitting: Internal Medicine

## 2014-01-08 VITALS — BP 128/70 | HR 57 | Temp 97.7°F | Resp 20 | Ht 71.0 in | Wt 244.0 lb

## 2014-01-08 DIAGNOSIS — N390 Urinary tract infection, site not specified: Secondary | ICD-10-CM

## 2014-01-08 DIAGNOSIS — E119 Type 2 diabetes mellitus without complications: Secondary | ICD-10-CM

## 2014-01-08 DIAGNOSIS — I10 Essential (primary) hypertension: Secondary | ICD-10-CM

## 2014-01-08 NOTE — Progress Notes (Signed)
Subjective:    Patient ID: [name], male    DOB: 05-06-1941, 73 y.o.   MRN: [id number]  HPI  73 year old patient history of diabetes and hypertension who was seen at a local urgent care last week complaint of urinary frequency and dysuria.  Urinalysis revealed bacteriuria and a CBC revealed an elevated Arpin count.  The patient is on day 5 of 7 of Levaquin and feels much improved.  No history of fever, chills, or flank pain  Past Medical History  Diagnosis Date  . Cancer     hx - melanoma  . Depression   . Hyperlipidemia   . Hypertension   . UTI (lower urinary tract infection)   . Colon polyps   . Diverticula, colon   . Chicken pox     History   Social History  . Marital Status: Married    Spouse Name: N/A    Number of Children: N/A  . Years of Education: N/A   Occupational History  . Not on file.   Social History Main Topics  . Smoking status: Never Smoker   . Smokeless tobacco: Never Used  . Alcohol Use: No  . Drug Use: No  . Sexual Activity: Not on file   Other Topics Concern  . Not on file   Social History Narrative  . No narrative on file    Past Surgical History  Procedure Laterality Date  . Melanoma excision      LEFT CHEST  . Melanoma excision      No family history on file.  Allergies  Allergen Reactions  . Lipitor [Atorvastatin Calcium]     Muscle ache    Current Outpatient Prescriptions on File Prior to Visit  Medication Sig Dispense Refill  . clopidogrel (PLAVIX) 75 MG tablet TAKE ONE TABLET (75MG  TOTAL) BY MOUTH DAILY  90 tablet  1  . esomeprazole (NEXIUM) 20 MG packet Take 20 mg by mouth daily before breakfast. OTC      . hydrochlorothiazide (HYDRODIURIL) 25 MG tablet Take 1 tablet (25 mg total) by mouth daily.  90 tablet  3  . metFORMIN (GLUCOPHAGE) 500 MG tablet TAKE ONE TABLET (50OMG TOTAL) BY MOUTH DAILY WITH BREAKFAST  90 tablet  3  . pravastatin (PRAVACHOL) 40 MG tablet Take 1 tablet (40 mg total) by mouth daily.  90 tablet  1    . quinapril (ACCUPRIL) 40 MG tablet TAKE ONE TABLET (40MG  TOTAL)  BY MOUTH DAILY  90 tablet  3   No current facility-administered medications on file prior to visit.    BP 128/70  Pulse 57  Temp(Src) 97.7 F (36.5 C) (Oral)  Resp 20  Ht 5\' 11"  (1.803 m)  Wt 244 lb (110.678 kg)  BMI 34.05 kg/m2  SpO2 98%       Review of Systems  Constitutional: Negative for fever, chills, activity change and appetite change.  Genitourinary: Positive for dysuria and frequency. Negative for flank pain.       Objective:   Physical Exam  Constitutional: He appears well-developed.  Afebrile  HENT:  Head: Normocephalic.  Right Ear: External ear normal.  Left Ear: External ear normal.  Eyes: Conjunctivae and EOM are normal.  Neck: Normal range of motion.  Cardiovascular: Normal rate and normal heart sounds.   Pulmonary/Chest: Breath sounds normal.  Abdominal: Bowel sounds are normal.  Musculoskeletal: He exhibits no edema and no tenderness.  Psychiatric: He has a normal mood and affect. His behavior is normal.  Assessment & Plan:    Resolving UTI.  Will complete antibiotic therapy Hypertension Diabetes  Annual CPX in 2 months.  Will followup urinalysis and CBC at that time

## 2014-01-08 NOTE — Patient Instructions (Signed)
Take your antibiotic as prescribed until ALL of it is gone, but stop if you develop a rash, swelling, or any side effects of the medication.  Contact our office as soon as possible if  there are side effects of the medication.  Call or return to clinic prn if these symptoms worsen or fail to improve as anticipated.  

## 2014-01-08 NOTE — Progress Notes (Signed)
Pre-visit discussion using our clinic review tool. No additional management support is needed unless otherwise documented below in the visit note.  

## 2014-01-09 ENCOUNTER — Telehealth: Payer: Self-pay | Admitting: Internal Medicine

## 2014-01-09 NOTE — Telephone Encounter (Signed)
Relevant patient education assigned to patient using Emmi. ° °

## 2014-01-22 ENCOUNTER — Telehealth: Payer: Self-pay

## 2014-01-22 NOTE — Telephone Encounter (Signed)
Relevant patient education assigned to patient using Emmi. ° °

## 2014-01-30 DIAGNOSIS — H26499 Other secondary cataract, unspecified eye: Secondary | ICD-10-CM | POA: Diagnosis not present

## 2014-03-06 ENCOUNTER — Encounter: Payer: Self-pay | Admitting: Internal Medicine

## 2014-03-06 ENCOUNTER — Ambulatory Visit (INDEPENDENT_AMBULATORY_CARE_PROVIDER_SITE_OTHER): Payer: Medicare Other | Admitting: Internal Medicine

## 2014-03-06 VITALS — BP 150/80 | HR 60 | Temp 98.4°F | Resp 20 | Ht 71.0 in | Wt 246.0 lb

## 2014-03-06 DIAGNOSIS — E785 Hyperlipidemia, unspecified: Secondary | ICD-10-CM | POA: Diagnosis not present

## 2014-03-06 DIAGNOSIS — I10 Essential (primary) hypertension: Secondary | ICD-10-CM | POA: Diagnosis not present

## 2014-03-06 DIAGNOSIS — M5430 Sciatica, unspecified side: Secondary | ICD-10-CM

## 2014-03-06 DIAGNOSIS — E119 Type 2 diabetes mellitus without complications: Secondary | ICD-10-CM

## 2014-03-06 MED ORDER — TRAMADOL HCL 50 MG PO TABS
50.0000 mg | ORAL_TABLET | Freq: Three times a day (TID) | ORAL | Status: DC | PRN
Start: 2014-03-06 — End: 2014-08-22

## 2014-03-06 MED ORDER — METHYLPREDNISOLONE ACETATE 80 MG/ML IJ SUSP
80.0000 mg | Freq: Once | INTRAMUSCULAR | Status: AC
  Administered 2014-03-06: 80 mg via INTRAMUSCULAR

## 2014-03-06 NOTE — Progress Notes (Signed)
Pre-visit discussion using our clinic review tool. No additional management support is needed unless otherwise documented below in the visit note.  

## 2014-03-06 NOTE — Progress Notes (Signed)
Subjective:    Patient ID: Brent Taylor, male    DOB: [birth year], 73 y.o.   MRN: [id number]  HPI  73 year old patient who has a history of lumbar disc disease.  And lumbar MRI reviewed from 2009 revealed multilevel disc protrusions as well is moderate spinal stenosis.  The past 2 years.  The patient states that he has had intermittent left lumbar pain with radiation down the right leg.  This usually responds well to ibuprofen.  The patient has had continuous pain since 9:30 yesterday evening that interfered with sleep.  It has been refractory to ibuprofen.  He has been up all my applying a heating pad.  Pain waxes and wanes somewhat but has been fairly constant.  No obvious aggravating or precipitating factors.  No motor weakness  Past Medical History  Diagnosis Date  . Cancer     hx - melanoma  . Depression   . Hyperlipidemia   . Hypertension   . UTI (lower urinary tract infection)   . Colon polyps   . Diverticula, colon   . Chicken pox     History   Social History  . Marital Status: Married    Spouse Name: N/A    Number of Children: N/A  . Years of Education: N/A   Occupational History  . Not on file.   Social History Main Topics  . Smoking status: Never Smoker   . Smokeless tobacco: Never Used  . Alcohol Use: No  . Drug Use: No  . Sexual Activity: Not on file   Other Topics Concern  . Not on file   Social History Narrative  . No narrative on file    Past Surgical History  Procedure Laterality Date  . Melanoma excision      LEFT CHEST  . Melanoma excision      No family history on file.  Allergies  Allergen Reactions  . Lipitor [Atorvastatin Calcium]     Muscle ache    Current Outpatient Prescriptions on File Prior to Visit  Medication Sig Dispense Refill  . clopidogrel (PLAVIX) 75 MG tablet TAKE ONE TABLET (75MG  TOTAL) BY MOUTH DAILY  90 tablet  1  . esomeprazole (NEXIUM) 20 MG packet Take 20 mg by mouth daily before breakfast. OTC      .  hydrochlorothiazide (HYDRODIURIL) 25 MG tablet Take 1 tablet (25 mg total) by mouth daily.  90 tablet  3  . metFORMIN (GLUCOPHAGE) 500 MG tablet TAKE ONE TABLET (50OMG TOTAL) BY MOUTH DAILY WITH BREAKFAST  90 tablet  3  . pravastatin (PRAVACHOL) 40 MG tablet Take 1 tablet (40 mg total) by mouth daily.  90 tablet  1  . quinapril (ACCUPRIL) 40 MG tablet TAKE ONE TABLET (40MG  TOTAL)  BY MOUTH DAILY  90 tablet  3   No current facility-administered medications on file prior to visit.    BP 150/80  Pulse 60  Temp(Src) 98.4 F (36.9 C) (Oral)  Resp 20  Ht 5\' 11"  (1.803 m)  Wt 246 lb (111.585 kg)  BMI 34.33 kg/m2  SpO2 97%       Review of Systems  Constitutional: Negative for fever, chills, appetite change and fatigue.  HENT: Negative for congestion, dental problem, ear pain, hearing loss, sore throat, tinnitus, trouble swallowing and voice change.   Eyes: Negative for pain, discharge and visual disturbance.  Respiratory: Negative for cough, chest tightness, wheezing and stridor.   Cardiovascular: Negative for chest pain, palpitations and leg swelling.  Gastrointestinal: Negative for  nausea, vomiting, abdominal pain, diarrhea, constipation, blood in stool and abdominal distention.  Genitourinary: Negative for urgency, hematuria, flank pain, discharge, difficulty urinating and genital sores.  Musculoskeletal: Positive for back pain. Negative for arthralgias, gait problem, joint swelling, myalgias and neck stiffness.  Skin: Negative for rash.  Neurological: Negative for dizziness, syncope, speech difficulty, weakness, numbness and headaches.  Hematological: Negative for adenopathy. Does not bruise/bleed easily.  Psychiatric/Behavioral: Negative for behavioral problems and dysphoric mood. The patient is not nervous/anxious.        Objective:   Physical Exam  Constitutional: He appears well-developed and well-nourished. No distress.  Blood pressure 140/80  Musculoskeletal:  Negative  straight leg test Reflexes intact Normal.  Sensory exam Normal.  Motor exam Able to walk on toes and heels without difficulty          Assessment & Plan:   Lumbar disc disease with right sciatica.  We'll do a Depo-Medrol and observed.  We'll also treat symptomatically with analgesics.  He is scheduled for a complete exam next, week.  We'll reassess at that time.

## 2014-03-06 NOTE — Patient Instructions (Signed)
Most patients with low back pain will improve with time over the next two to 6 weeks.  Keep active but avoid any activities that cause pain.  Apply moist heat to the low back area several times daily.  Pain  Medications as directedSciatica with Rehab The sciatic nerve runs from the back down the leg and is responsible for sensation and control of the muscles in the back (posterior) side of the thigh, lower leg, and foot. Sciatica is a condition that is characterized by inflammation of this nerve.  SYMPTOMS   Signs of nerve damage, including numbness and/or weakness along the posterior side of the lower extremity.  Pain in the back of the thigh that may also travel down the leg.  Pain that worsens when sitting for long periods of time.  Occasionally, pain in the back or buttock. CAUSES  Inflammation of the sciatic nerve is the cause of sciatica. The inflammation is due to something irritating the nerve. Common sources of irritation include:  Sitting for long periods of time.  Direct trauma to the nerve.  Arthritis of the spine.  Herniated or ruptured disk.  Slipping of the vertebrae (spondylolithesis)  Pressure from soft tissues, such as muscles or ligament-like tissue (fascia). RISK INCREASES WITH:  Sports that place pressure or stress on the spine (football or weightlifting).  Poor strength and flexibility.  Failure to warm-up properly before activity.  Family history of low back pain or disk disorders.  Previous back injury or surgery.  Poor body mechanics, especially when lifting, or poor posture. PREVENTION   Warm up and stretch properly before activity.  Maintain physical fitness:  Strength, flexibility, and endurance.  Cardiovascular fitness.  Learn and use proper technique, especially with posture and lifting. When possible, have coach correct improper technique.  Avoid activities that place stress on the spine. PROGNOSIS If treated properly, then  sciatica usually resolves within 6 weeks. However, occasionally surgery is necessary.  RELATED COMPLICATIONS   Permanent nerve damage, including pain, numbness, tingle, or weakness.  Chronic back pain.  Risks of surgery: infection, bleeding, nerve damage, or damage to surrounding tissues. TREATMENT Treatment initially involves resting from any activities that aggravate your symptoms. The use of ice and medication may help reduce pain and inflammation. The use of strengthening and stretching exercises may help reduce pain with activity. These exercises may be performed at home or with referral to a therapist. A therapist may recommend further treatments, such as transcutaneous electronic nerve stimulation (TENS) or ultrasound. Your caregiver may recommend corticosteroid injections to help reduce inflammation of the sciatic nerve. If symptoms persist despite non-surgical (conservative) treatment, then surgery may be recommended. MEDICATION  If pain medication is necessary, then nonsteroidal anti-inflammatory medications, such as aspirin and ibuprofen, or other minor pain relievers, such as acetaminophen, are often recommended.  Do not take pain medication for 7 days before surgery.  Prescription pain relievers may be given if deemed necessary by your caregiver. Use only as directed and only as much as you need.  Ointments applied to the skin may be helpful.  Corticosteroid injections may be given by your caregiver. These injections should be reserved for the most serious cases, because they may only be given a certain number of times. HEAT AND COLD  Cold treatment (icing) relieves pain and reduces inflammation. Cold treatment should be applied for 10 to 15 minutes every 2 to 3 hours for inflammation and pain and immediately after any activity that aggravates your symptoms. Use ice packs or massage  the area with a piece of ice (ice massage).  Heat treatment may be used prior to performing the  stretching and strengthening activities prescribed by your caregiver, physical therapist, or athletic trainer. Use a heat pack or soak the injury in warm water. SEEK MEDICAL CARE IF:  Treatment seems to offer no benefit, or the condition worsens.  Any medications produce adverse side effects. EXERCISES  RANGE OF MOTION (ROM) AND STRETCHING EXERCISES - Sciatica Most people with sciatic will find that their symptoms worsen with either excessive bending forward (flexion) or arching at the low back (extension). The exercises which will help resolve your symptoms will focus on the opposite motion. Your physician, physical therapist or athletic trainer will help you determine which exercises will be most helpful to resolve your low back pain. Do not complete any exercises without first consulting with your clinician. Discontinue any exercises which worsen your symptoms until you speak to your clinician. If you have pain, numbness or tingling which travels down into your buttocks, leg or foot, the goal of the therapy is for these symptoms to move closer to your back and eventually resolve. Occasionally, these leg symptoms will get better, but your low back pain may worsen; this is typically an indication of progress in your rehabilitation. Be certain to be very alert to any changes in your symptoms and the activities in which you participated in the 24 hours prior to the change. Sharing this information with your clinician will allow him/her to most efficiently treat your condition. These exercises may help you when beginning to rehabilitate your injury. Your symptoms may resolve with or without further involvement from your physician, physical therapist or athletic trainer. While completing these exercises, remember:   Restoring tissue flexibility helps normal motion to return to the joints. This allows healthier, less painful movement and activity.  An effective stretch should be held for at least 30  seconds.  A stretch should never be painful. You should only feel a gentle lengthening or release in the stretched tissue. FLEXION RANGE OF MOTION AND STRETCHING EXERCISES: STRETCH  Flexion, Single Knee to Chest   Lie on a firm bed or floor with both legs extended in front of you.  Keeping one leg in contact with the floor, bring your opposite knee to your chest. Hold your leg in place by either grabbing behind your thigh or at your knee.  Pull until you feel a gentle stretch in your low back. Hold __________ seconds.  Slowly release your grasp and repeat the exercise with the opposite side. Repeat __________ times. Complete this exercise __________ times per day.  STRETCH  Flexion, Double Knee to Chest  Lie on a firm bed or floor with both legs extended in front of you.  Keeping one leg in contact with the floor, bring your opposite knee to your chest.  Tense your stomach muscles to support your back and then lift your other knee to your chest. Hold your legs in place by either grabbing behind your thighs or at your knees.  Pull both knees toward your chest until you feel a gentle stretch in your low back. Hold __________ seconds.  Tense your stomach muscles and slowly return one leg at a time to the floor. Repeat __________ times. Complete this exercise __________ times per day.  STRETCH  Low Trunk Rotation   Lie on a firm bed or floor. Keeping your legs in front of you, bend your knees so they are both pointed toward the ceiling  and your feet are flat on the floor.  Extend your arms out to the side. This will stabilize your upper body by keeping your shoulders in contact with the floor.  Gently and slowly drop both knees together to one side until you feel a gentle stretch in your low back. Hold for __________ seconds.  Tense your stomach muscles to support your low back as you bring your knees back to the starting position. Repeat the exercise to the other side. Repeat  __________ times. Complete this exercise __________ times per day  EXTENSION RANGE OF MOTION AND FLEXIBILITY EXERCISES: STRETCH  Extension, Prone on Elbows  Lie on your stomach on the floor, a bed will be too soft. Place your palms about shoulder width apart and at the height of your head.  Place your elbows under your shoulders. If this is too painful, stack pillows under your chest.  Allow your body to relax so that your hips drop lower and make contact more completely with the floor.  Hold this position for __________ seconds.  Slowly return to lying flat on the floor. Repeat __________ times. Complete this exercise __________ times per day.  RANGE OF MOTION  Extension, Prone Press Ups  Lie on your stomach on the floor, a bed will be too soft. Place your palms about shoulder width apart and at the height of your head.  Keeping your back as relaxed as possible, slowly straighten your elbows while keeping your hips on the floor. You may adjust the placement of your hands to maximize your comfort. As you gain motion, your hands will come more underneath your shoulders.  Hold this position __________ seconds.  Slowly return to lying flat on the floor. Repeat __________ times. Complete this exercise __________ times per day.  STRENGTHENING EXERCISES - Sciatica  These exercises may help you when beginning to rehabilitate your injury. These exercises should be done near your "sweet spot." This is the neutral, low-back arch, somewhere between fully rounded and fully arched, that is your least painful position. When performed in this safe range of motion, these exercises can be used for people who have either a flexion or extension based injury. These exercises may resolve your symptoms with or without further involvement from your physician, physical therapist or athletic trainer. While completing these exercises, remember:   Muscles can gain both the endurance and the strength needed for  everyday activities through controlled exercises.  Complete these exercises as instructed by your physician, physical therapist or athletic trainer. Progress with the resistance and repetition exercises only as your caregiver advises.  You may experience muscle soreness or fatigue, but the pain or discomfort you are trying to eliminate should never worsen during these exercises. If this pain does worsen, stop and make certain you are following the directions exactly. If the pain is still present after adjustments, discontinue the exercise until you can discuss the trouble with your clinician. STRENGTHENING Deep Abdominals, Pelvic Tilt   Lie on a firm bed or floor. Keeping your legs in front of you, bend your knees so they are both pointed toward the ceiling and your feet are flat on the floor.  Tense your lower abdominal muscles to press your low back into the floor. This motion will rotate your pelvis so that your tail bone is scooping upwards rather than pointing at your feet or into the floor.  With a gentle tension and even breathing, hold this position for __________ seconds. Repeat __________ times. Complete this exercise __________  times per day.  STRENGTHENING  Abdominals, Crunches   Lie on a firm bed or floor. Keeping your legs in front of you, bend your knees so they are both pointed toward the ceiling and your feet are flat on the floor. Cross your arms over your chest.  Slightly tip your chin down without bending your neck.  Tense your abdominals and slowly lift your trunk high enough to just clear your shoulder blades. Lifting higher can put excessive stress on the low back and does not further strengthen your abdominal muscles.  Control your return to the starting position. Repeat __________ times. Complete this exercise __________ times per day.  STRENGTHENING  Quadruped, Opposite UE/LE Lift  Assume a hands and knees position on a firm surface. Keep your hands under your  shoulders and your knees under your hips. You may place padding under your knees for comfort.  Find your neutral spine and gently tense your abdominal muscles so that you can maintain this position. Your shoulders and hips should form a rectangle that is parallel with the floor and is not twisted.  Keeping your trunk steady, lift your right hand no higher than your shoulder and then your left leg no higher than your hip. Make sure you are not holding your breath. Hold this position __________ seconds.  Continuing to keep your abdominal muscles tense and your back steady, slowly return to your starting position. Repeat with the opposite arm and leg. Repeat __________ times. Complete this exercise __________ times per day.  STRENGTHENING  Abdominals and Quadriceps, Straight Leg Raise   Lie on a firm bed or floor with both legs extended in front of you.  Keeping one leg in contact with the floor, bend the other knee so that your foot can rest flat on the floor.  Find your neutral spine, and tense your abdominal muscles to maintain your spinal position throughout the exercise.  Slowly lift your straight leg off the floor about 6 inches for a count of 15, making sure to not hold your breath.  Still keeping your neutral spine, slowly lower your leg all the way to the floor. Repeat this exercise with each leg __________ times. Complete this exercise __________ times per day. POSTURE AND BODY MECHANICS CONSIDERATIONS - Sciatica Keeping correct posture when sitting, standing or completing your activities will reduce the stress put on different body tissues, allowing injured tissues a chance to heal and limiting painful experiences. The following are general guidelines for improved posture. Your physician or physical therapist will provide you with any instructions specific to your needs. While reading these guidelines, remember:  The exercises prescribed by your provider will help you have the  flexibility and strength to maintain correct postures.  The correct posture provides the optimal environment for your joints to work. All of your joints have less wear and tear when properly supported by a spine with good posture. This means you will experience a healthier, less painful body.  Correct posture must be practiced with all of your activities, especially prolonged sitting and standing. Correct posture is as important when doing repetitive low-stress activities (typing) as it is when doing a single heavy-load activity (lifting). RESTING POSITIONS Consider which positions are most painful for you when choosing a resting position. If you have pain with flexion-based activities (sitting, bending, stooping, squatting), choose a position that allows you to rest in a less flexed posture. You would want to avoid curling into a fetal position on your side. If your pain  worsens with extension-based activities (prolonged standing, working overhead), avoid resting in an extended position such as sleeping on your stomach. Most people will find more comfort when they rest with their spine in a more neutral position, neither too rounded nor too arched. Lying on a non-sagging bed on your side with a pillow between your knees, or on your back with a pillow under your knees will often provide some relief. Keep in mind, being in any one position for a prolonged period of time, no matter how correct your posture, can still lead to stiffness. PROPER SITTING POSTURE In order to minimize stress and discomfort on your spine, you must sit with correct posture Sitting with good posture should be effortless for a healthy body. Returning to good posture is a gradual process. Many people can work toward this most comfortably by using various supports until they have the flexibility and strength to maintain this posture on their own. When sitting with proper posture, your ears will fall over your shoulders and your shoulders  will fall over your hips. You should use the back of the chair to support your upper back. Your low back will be in a neutral position, just slightly arched. You may place a small pillow or folded towel at the base of your low back for support.  When working at a desk, create an environment that supports good, upright posture. Without extra support, muscles fatigue and lead to excessive strain on joints and other tissues. Keep these recommendations in mind: CHAIR:   A chair should be able to slide under your desk when your back makes contact with the back of the chair. This allows you to work closely.  The chair's height should allow your eyes to be level with the upper part of your monitor and your hands to be slightly lower than your elbows. BODY POSITION  Your feet should make contact with the floor. If this is not possible, use a foot rest.  Keep your ears over your shoulders. This will reduce stress on your neck and low back. INCORRECT SITTING POSTURES   If you are feeling tired and unable to assume a healthy sitting posture, do not slouch or slump. This puts excessive strain on your back tissues, causing more damage and pain. Healthier options include:  Using more support, like a lumbar pillow.  Switching tasks to something that requires you to be upright or walking.  Talking a brief walk.  Lying down to rest in a neutral-spine position. PROLONGED STANDING WHILE SLIGHTLY LEANING FORWARD  When completing a task that requires you to lean forward while standing in one place for a long time, place either foot up on a stationary 2-4 inch high object to help maintain the best posture. When both feet are on the ground, the low back tends to lose its slight inward curve. If this curve flattens (or becomes too large), then the back and your other joints will experience too much stress, fatigue more quickly and can cause pain.  CORRECT STANDING POSTURES Proper standing posture should be assumed  with all daily activities, even if they only take a few moments, like when brushing your teeth. As in sitting, your ears should fall over your shoulders and your shoulders should fall over your hips. You should keep a slight tension in your abdominal muscles to brace your spine. Your tailbone should point down to the ground, not behind your body, resulting in an over-extended swayback posture.  INCORRECT STANDING POSTURES  Common incorrect  standing postures include a forward head, locked knees and/or an excessive swayback. WALKING Walk with an upright posture. Your ears, shoulders and hips should all line-up. PROLONGED ACTIVITY IN A FLEXED POSITION When completing a task that requires you to bend forward at your waist or lean over a low surface, try to find a way to stabilize 3 of 4 of your limbs. You can place a hand or elbow on your thigh or rest a knee on the surface you are reaching across. This will provide you more stability so that your muscles do not fatigue as quickly. By keeping your knees relaxed, or slightly bent, you will also reduce stress across your low back. CORRECT LIFTING TECHNIQUES DO :   Assume a wide stance. This will provide you more stability and the opportunity to get as close as possible to the object which you are lifting.  Tense your abdominals to brace your spine; then bend at the knees and hips. Keeping your back locked in a neutral-spine position, lift using your leg muscles. Lift with your legs, keeping your back straight.  Test the weight of unknown objects before attempting to lift them.  Try to keep your elbows locked down at your sides in order get the best strength from your shoulders when carrying an object.  Always ask for help when lifting heavy or awkward objects. INCORRECT LIFTING TECHNIQUES DO NOT:   Lock your knees when lifting, even if it is a small object.  Bend and twist. Pivot at your feet or move your feet when needing to change  directions.  Assume that you cannot safely pick up a paperclip without proper posture. Document Released: 09/27/2005 Document Revised: 12/20/2011 Document Reviewed: 01/09/2009 [facility] Patient Information 2014 [place], [place].

## 2014-03-13 ENCOUNTER — Encounter: Payer: Self-pay | Admitting: Internal Medicine

## 2014-03-13 ENCOUNTER — Ambulatory Visit (INDEPENDENT_AMBULATORY_CARE_PROVIDER_SITE_OTHER): Payer: Medicare Other | Admitting: Internal Medicine

## 2014-03-13 VITALS — BP 142/80 | HR 66 | Temp 98.6°F | Resp 20 | Ht 70.5 in | Wt 239.0 lb

## 2014-03-13 DIAGNOSIS — Z860100 Personal history of colon polyps, unspecified: Secondary | ICD-10-CM

## 2014-03-13 DIAGNOSIS — M999 Biomechanical lesion, unspecified: Secondary | ICD-10-CM | POA: Diagnosis not present

## 2014-03-13 DIAGNOSIS — M545 Low back pain, unspecified: Secondary | ICD-10-CM | POA: Diagnosis not present

## 2014-03-13 DIAGNOSIS — I2510 Atherosclerotic heart disease of native coronary artery without angina pectoris: Secondary | ICD-10-CM | POA: Diagnosis not present

## 2014-03-13 DIAGNOSIS — I10 Essential (primary) hypertension: Secondary | ICD-10-CM

## 2014-03-13 DIAGNOSIS — E785 Hyperlipidemia, unspecified: Secondary | ICD-10-CM | POA: Diagnosis not present

## 2014-03-13 DIAGNOSIS — M5430 Sciatica, unspecified side: Secondary | ICD-10-CM | POA: Diagnosis not present

## 2014-03-13 DIAGNOSIS — Z0000 Encounter for general adult medical examination without abnormal findings: Secondary | ICD-10-CM

## 2014-03-13 DIAGNOSIS — E119 Type 2 diabetes mellitus without complications: Secondary | ICD-10-CM

## 2014-03-13 DIAGNOSIS — Z23 Encounter for immunization: Secondary | ICD-10-CM | POA: Diagnosis not present

## 2014-03-13 LAB — COMPREHENSIVE METABOLIC PANEL
ALT: 23 U/L (ref 0–53)
AST: 26 U/L (ref 0–37)
Albumin: 4 g/dL (ref 3.5–5.2)
Alkaline Phosphatase: 44 U/L (ref 39–117)
BUN: 40 mg/dL — ABNORMAL HIGH (ref 6–23)
CHLORIDE: 102 meq/L (ref 96–112)
CO2: 27 meq/L (ref 19–32)
CREATININE: 1.6 mg/dL — AB (ref 0.4–1.5)
Calcium: 9.2 mg/dL (ref 8.4–10.5)
GFR: 46.65 mL/min — AB (ref >60.00)
Glucose, Bld: 113 mg/dL — ABNORMAL HIGH (ref 70–99)
Potassium: 4.1 mEq/L (ref 3.5–5.1)
Sodium: 138 mEq/L (ref 135–145)
TOTAL PROTEIN: 7.1 g/dL (ref 6.0–8.3)
Total Bilirubin: 1 mg/dL (ref 0.2–1.2)

## 2014-03-13 LAB — LIPID PANEL
Cholesterol: 137 mg/dL (ref 0–200)
HDL: 35.2 mg/dL — AB (ref >39.00)
LDL Cholesterol: 74 mg/dL (ref 0–99)
NonHDL: 101.8
Total CHOL/HDL Ratio: 4
Triglycerides: 140 mg/dL (ref 0.0–149.0)
VLDL: 28 mg/dL (ref 0.0–40.0)

## 2014-03-13 LAB — MICROALBUMIN / CREATININE URINE RATIO
Creatinine,U: 132.9 mg/dL
MICROALB UR: 0.6 mg/dL (ref 0.0–1.9)
Microalb Creat Ratio: 0.5 mg/g (ref 0.0–30.0)

## 2014-03-13 LAB — CBC WITH DIFFERENTIAL/PLATELET
BASOS ABS: 0 10*3/uL (ref 0.0–0.1)
Basophils Relative: 0 % (ref 0.0–3.0)
EOS ABS: 0 10*3/uL (ref 0.0–0.7)
Eosinophils Relative: 0.3 % (ref 0.0–5.0)
HCT: 47.4 % (ref 39.0–52.0)
Hemoglobin: 15.4 g/dL (ref 13.0–17.0)
LYMPHS PCT: 3.7 % — AB (ref 12.0–46.0)
Lymphs Abs: 0.4 10*3/uL — ABNORMAL LOW (ref 0.7–4.0)
MCHC: 32.5 g/dL (ref 30.0–36.0)
MCV: 84.6 fl (ref 78.0–100.0)
Monocytes Absolute: 0.8 10*3/uL (ref 0.1–1.0)
Monocytes Relative: 6.5 % (ref 3.0–12.0)
NEUTROS ABS: 10.5 10*3/uL — AB (ref 1.4–7.7)
Platelets: 222 10*3/uL (ref 150.0–400.0)
RBC: 5.61 Mil/uL (ref 4.22–5.81)
RDW: 16.4 % — AB (ref 11.5–15.5)
WBC: 11.7 10*3/uL — ABNORMAL HIGH (ref 4.0–10.5)

## 2014-03-13 LAB — HEMOGLOBIN A1C: Hgb A1c MFr Bld: 6.5 % (ref 4.6–6.5)

## 2014-03-13 LAB — TSH: TSH: 0.53 u[IU]/mL (ref 0.35–4.50)

## 2014-03-13 NOTE — Patient Instructions (Signed)
It is important that you exercise regularly, at least 20 minutes 3 to 4 times per week.  If you develop chest pain or shortness of breath seek  medical attention.  You need to lose weight.  Consider a lower calorie diet and regular exercise.  Limit your sodium (Salt) intake  Return in 6 months for follow-up

## 2014-03-13 NOTE — Progress Notes (Signed)
Pre-visit discussion using our clinic review tool. No additional management support is needed unless otherwise documented below in the visit note.  

## 2014-03-13 NOTE — Progress Notes (Signed)
Patient ID: Brent Taylor, male   DOB: [birth year], 73 y.o.   MRN: [id number]  Subjective:    Patient ID: Brent Taylor, male    DOB: [birth year], 73 y.o.   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 He has a history of coronary artery disease. Apparently due to multiple risk factors he underwent a stress test some years ago which led to a heart catheterization. He was told that he had a vessel with moderate blockage but did not require any mechanical intervention. He has no history of chest pain. Cardiac risk factors include hypertension for approximately 40 years. He has been on metformin for 2 years for type 2 diabetes. He states that he is followed annually due to a left popliteal artery aneurysm.  His last arterial Doppler, however, revealed tortuosity, but no aneurysm.  Medical records were reviewed. He had an  Ectatic left popliteal artery but no focal aneurysm. He denies any claudication. In 2000 he underwent a left mastectomy for a melanoma and has been seen in dermatology annually. He has a history of colonic polyps and his last colonoscopy was in 2001. He has gastroesophageal reflux disease which has been contacted by stricture formation requiring dilatation on 2 or 3 prior occasions. He has a history depression and has been on Prozac for in excess of 10 years. He has never had any trial off this medication.  Social history married 2 children and 4 grandchildren retired Company secretary nonsmoker masters degree level of education  Family history father died age 26 he should tobacco use status post CABG Mother died age 16 history of hypertension  Medicare wellness:  1. Risk factors, based on past  M,S,F history-  patient has known moderate single-vessel coronary artery disease which remains asymptomatic. Card*factors include hypertension diabetes and dyslipidemia   2.  Physical activities: [facility] No exertional chest pain or symptoms of claudication   3.  Depression/mood: history of depression which has been stable  4.  Hearing: no deficits   5.  ADL's:independent in all aspects of daily living   6.  Fall risk: low  7.  Home safety: no problems identified   8.  Height weight, and visual acuity;height and weight stable no change in visual acuity has had a recent eye examination  9.  Counseling:  10. Lab orders based on risk factors: laboratory profile including lipid panel and hemoglobin A1c will be reviewed   11. Referral : not appropriate at this time   12. Care plan: heart healthy diet regular exercise and modest weight loss all encouraged  13. Cognitive assessment:  alert and oriented with normal affect. No cognitive dysfunction       Review of Systems  Constitutional: Negative for fever, chills, appetite change and fatigue.  HENT: Negative for congestion, dental problem, ear pain, hearing loss, sore throat, tinnitus, trouble swallowing and voice change.   Eyes: Negative for pain, discharge and visual disturbance.  Respiratory: Negative for cough, chest tightness, wheezing and stridor.   Cardiovascular: Negative for chest pain, palpitations and leg swelling.  Gastrointestinal: Negative for nausea, vomiting, abdominal pain, diarrhea, constipation, blood in stool and abdominal distention.  Genitourinary: Negative for urgency, hematuria, flank pain, discharge, difficulty urinating and genital sores.  Musculoskeletal: Negative for arthralgias, back pain, gait problem, joint swelling, myalgias and neck stiffness.       Recent back pain and sciatica, resolved  Skin: Negative for rash.  Neurological: Negative for dizziness, syncope, speech difficulty, weakness, numbness and headaches.  Hematological: Negative for adenopathy. Does not bruise/bleed easily.  Psychiatric/Behavioral: Negative for behavioral problems and dysphoric mood. The patient is  not nervous/anxious.        Objective:   Physical Exam  Constitutional: He appears well-developed and well-nourished.  HENT:  Head: Normocephalic and atraumatic.  Right Ear: External ear normal.  Left Ear: External ear normal.  Nose: Nose normal.  Mouth/Throat: Oropharynx is clear and moist.  Eyes: Conjunctivae and EOM are normal. Pupils are equal, round, and reactive to light. No scleral icterus.  Neck: Normal range of motion. Neck supple. No JVD present. No thyromegaly present.  Cardiovascular: Regular rhythm, normal heart sounds and intact distal pulses.  Exam reveals no gallop and no friction rub.   No murmur heard. Dorsalis pedis pulses were nonpalpable but posterior tibial pulses were full bilaterally  Pulmonary/Chest: Effort normal and breath sounds normal. He exhibits no tenderness.  Status post left mastectomy  Abdominal: Soft. Bowel sounds are normal. He exhibits no distension and no mass. There is no tenderness.  Genitourinary: Prostate normal and penis normal. Guaiac negative stool.  Prostate +2 enlarged  Musculoskeletal: Normal range of motion. He exhibits no edema and no tenderness.  Lymphadenopathy:    He has no cervical adenopathy.  Neurological: He is alert. He has normal reflexes. No cranial nerve deficit. Coordination normal.  Skin: Skin is warm and dry. No rash noted.  Psychiatric: He has a normal mood and affect. His behavior is normal.          Assessment & Plan:   Preventive health examination Coronary artery disease. Continue aggressive risk factor modification Hypertension well controlled Type 2 diabetes. We'll check a hemoglobin A1c Dyslipidemia Colonic polyps Gastroesophageal reflux disease with stricture formation. Continue PPI medication    Recheck 6 months weight loss low salt diet and exercise are encouraged

## 2014-03-14 DIAGNOSIS — M47817 Spondylosis without myelopathy or radiculopathy, lumbosacral region: Secondary | ICD-10-CM | POA: Diagnosis not present

## 2014-03-14 DIAGNOSIS — M999 Biomechanical lesion, unspecified: Secondary | ICD-10-CM | POA: Diagnosis not present

## 2014-03-14 DIAGNOSIS — M545 Low back pain, unspecified: Secondary | ICD-10-CM | POA: Diagnosis not present

## 2014-03-14 DIAGNOSIS — M5430 Sciatica, unspecified side: Secondary | ICD-10-CM | POA: Diagnosis not present

## 2014-03-18 DIAGNOSIS — M545 Low back pain, unspecified: Secondary | ICD-10-CM | POA: Diagnosis not present

## 2014-03-18 DIAGNOSIS — M999 Biomechanical lesion, unspecified: Secondary | ICD-10-CM | POA: Diagnosis not present

## 2014-03-18 DIAGNOSIS — M5430 Sciatica, unspecified side: Secondary | ICD-10-CM | POA: Diagnosis not present

## 2014-03-20 DIAGNOSIS — M5430 Sciatica, unspecified side: Secondary | ICD-10-CM | POA: Diagnosis not present

## 2014-03-20 DIAGNOSIS — M545 Low back pain, unspecified: Secondary | ICD-10-CM | POA: Diagnosis not present

## 2014-03-20 DIAGNOSIS — M999 Biomechanical lesion, unspecified: Secondary | ICD-10-CM | POA: Diagnosis not present

## 2014-03-21 DIAGNOSIS — M545 Low back pain, unspecified: Secondary | ICD-10-CM | POA: Diagnosis not present

## 2014-03-21 DIAGNOSIS — M999 Biomechanical lesion, unspecified: Secondary | ICD-10-CM | POA: Diagnosis not present

## 2014-03-21 DIAGNOSIS — M5430 Sciatica, unspecified side: Secondary | ICD-10-CM | POA: Diagnosis not present

## 2014-04-10 DIAGNOSIS — L820 Inflamed seborrheic keratosis: Secondary | ICD-10-CM | POA: Diagnosis not present

## 2014-04-10 DIAGNOSIS — L570 Actinic keratosis: Secondary | ICD-10-CM | POA: Diagnosis not present

## 2014-04-10 DIAGNOSIS — L819 Disorder of pigmentation, unspecified: Secondary | ICD-10-CM | POA: Diagnosis not present

## 2014-04-10 DIAGNOSIS — D485 Neoplasm of uncertain behavior of skin: Secondary | ICD-10-CM | POA: Diagnosis not present

## 2014-04-10 DIAGNOSIS — Z85820 Personal history of malignant melanoma of skin: Secondary | ICD-10-CM | POA: Diagnosis not present

## 2014-04-10 DIAGNOSIS — D234 Other benign neoplasm of skin of scalp and neck: Secondary | ICD-10-CM | POA: Diagnosis not present

## 2014-04-10 DIAGNOSIS — L821 Other seborrheic keratosis: Secondary | ICD-10-CM | POA: Diagnosis not present

## 2014-04-10 DIAGNOSIS — Z85828 Personal history of other malignant neoplasm of skin: Secondary | ICD-10-CM | POA: Diagnosis not present

## 2014-05-23 ENCOUNTER — Other Ambulatory Visit: Payer: Self-pay | Admitting: Internal Medicine

## 2014-07-05 ENCOUNTER — Other Ambulatory Visit: Payer: Self-pay | Admitting: Internal Medicine

## 2014-07-09 DIAGNOSIS — Z85828 Personal history of other malignant neoplasm of skin: Secondary | ICD-10-CM | POA: Diagnosis not present

## 2014-07-09 DIAGNOSIS — Z85820 Personal history of malignant melanoma of skin: Secondary | ICD-10-CM | POA: Diagnosis not present

## 2014-07-09 DIAGNOSIS — L821 Other seborrheic keratosis: Secondary | ICD-10-CM | POA: Diagnosis not present

## 2014-07-19 DIAGNOSIS — L603 Nail dystrophy: Secondary | ICD-10-CM | POA: Diagnosis not present

## 2014-07-19 DIAGNOSIS — Z85820 Personal history of malignant melanoma of skin: Secondary | ICD-10-CM | POA: Diagnosis not present

## 2014-07-19 DIAGNOSIS — D692 Other nonthrombocytopenic purpura: Secondary | ICD-10-CM | POA: Diagnosis not present

## 2014-07-19 DIAGNOSIS — Z85828 Personal history of other malignant neoplasm of skin: Secondary | ICD-10-CM | POA: Diagnosis not present

## 2014-07-22 DIAGNOSIS — J387 Other diseases of larynx: Secondary | ICD-10-CM | POA: Diagnosis not present

## 2014-07-25 ENCOUNTER — Ambulatory Visit (INDEPENDENT_AMBULATORY_CARE_PROVIDER_SITE_OTHER): Payer: Medicare Other

## 2014-07-25 DIAGNOSIS — Z23 Encounter for immunization: Secondary | ICD-10-CM

## 2014-08-12 ENCOUNTER — Other Ambulatory Visit: Payer: Self-pay | Admitting: Internal Medicine

## 2014-08-19 ENCOUNTER — Other Ambulatory Visit: Payer: Self-pay | Admitting: Internal Medicine

## 2014-08-22 ENCOUNTER — Encounter: Payer: Self-pay | Admitting: Podiatry

## 2014-08-22 ENCOUNTER — Ambulatory Visit (INDEPENDENT_AMBULATORY_CARE_PROVIDER_SITE_OTHER): Payer: Medicare Other | Admitting: Podiatry

## 2014-08-22 VITALS — BP 124/82 | HR 65 | Resp 16

## 2014-08-22 DIAGNOSIS — I2510 Atherosclerotic heart disease of native coronary artery without angina pectoris: Secondary | ICD-10-CM

## 2014-08-22 DIAGNOSIS — S90121A Contusion of right lesser toe(s) without damage to nail, initial encounter: Secondary | ICD-10-CM | POA: Diagnosis not present

## 2014-08-22 NOTE — Patient Instructions (Signed)
Betadine Soak Instructions  Purchase an 8 oz. bottle of BETADINE solution (Povidone)  THE DAY AFTER THE PROCEDURE  Place 1 tablespoon of betadine solution in a quart of warm tap water.  Submerge your foot or feet with outer bandage intact for the initial soak; this will allow the bandage to become moist and wet for easy lift off.  Once you remove your bandage, continue to soak in the solution for 20 minutes.  This soak should be done twice a day.  Next, remove your foot or feet from solution, blot dry the affected area and cover.  You may use a band aid large enough to cover the area or use gauze and tape.  Apply other medications to the area as directed by the doctor such as cortisporin otic solution (ear drops) or neosporin.  IF YOUR SKIN BECOMES IRRITATED WHILE USING THESE INSTRUCTIONS, IT IS OKAY TO SWITCH TO EPSOM SALTS AND WATER OR Loud VINEGAR AND WATER. 

## 2014-08-22 NOTE — Progress Notes (Signed)
Subjective:     Patient ID: Brent Taylor, male   DOB: 09/19/1941, 73 y.o.   MRN: [id number]  HPIpatient states I dropped a can on my right big toe and it's been red and swollen for 2 weeks and I was concerned about infection or nail damage   Review of Systems  All other systems reviewed and are negative.      Objective:   Physical Exam  Constitutional: He is oriented to person, place, and time.  Cardiovascular: Intact distal pulses.   Musculoskeletal: Normal range of motion.  Neurological: He is oriented to person, place, and time.  Skin: Skin is warm.  Nursing note and vitals reviewed. neurovascular status was found to be intact with muscle strength adequate and high arch foot type noted with digital deformity of the lesser digits of both feet that are nonpainful. Right hallux is bruised on the proximal portion of the nailbed with mild looseness but I noted no drainage and no pain when palpated     Assessment:     Damaged hallux nail right secondary to trauma    Plan:     H&P and condition reviewed with patient. Recommended soaks bandage treatment and if it were to start to get loose drain or become painful we will need to remove the nail. Reappoint as needed

## 2014-08-22 NOTE — Progress Notes (Signed)
   Subjective:    Patient ID: Brent Taylor, male    DOB: 11/21/1940, 73 y.o.   MRN: [id number]  HPI Comments: "I have hurt my toe"  Patient c/o aching 1st toe right for about 2 weeks. He dropped a can of spray starch on the toe. The toe is red and swollen. Kept bleeding periodically. He's just been keeping it clean and covering with a band aid.     Review of Systems  All other systems reviewed and are negative.      Objective:   Physical Exam        Assessment & Plan:

## 2014-09-09 ENCOUNTER — Ambulatory Visit (INDEPENDENT_AMBULATORY_CARE_PROVIDER_SITE_OTHER): Payer: Medicare Other | Admitting: Internal Medicine

## 2014-09-09 ENCOUNTER — Encounter: Payer: Self-pay | Admitting: Internal Medicine

## 2014-09-09 VITALS — BP 124/80 | HR 63 | Temp 98.3°F | Resp 20 | Ht 70.5 in | Wt 243.0 lb

## 2014-09-09 DIAGNOSIS — E785 Hyperlipidemia, unspecified: Secondary | ICD-10-CM | POA: Diagnosis not present

## 2014-09-09 DIAGNOSIS — I2510 Atherosclerotic heart disease of native coronary artery without angina pectoris: Secondary | ICD-10-CM | POA: Diagnosis not present

## 2014-09-09 DIAGNOSIS — E119 Type 2 diabetes mellitus without complications: Secondary | ICD-10-CM | POA: Diagnosis not present

## 2014-09-09 DIAGNOSIS — K219 Gastro-esophageal reflux disease without esophagitis: Secondary | ICD-10-CM

## 2014-09-09 DIAGNOSIS — I10 Essential (primary) hypertension: Secondary | ICD-10-CM

## 2014-09-09 DIAGNOSIS — Z86010 Personal history of colonic polyps: Secondary | ICD-10-CM | POA: Diagnosis not present

## 2014-09-09 LAB — BASIC METABOLIC PANEL
BUN: 26 mg/dL — ABNORMAL HIGH (ref 6–23)
CO2: 25 mEq/L (ref 19–32)
Calcium: 9.3 mg/dL (ref 8.4–10.5)
Chloride: 99 mEq/L (ref 96–112)
Creatinine, Ser: 1.4 mg/dL (ref 0.4–1.5)
GFR: 54.18 mL/min — AB (ref >60.00)
Glucose, Bld: 98 mg/dL (ref 70–99)
Potassium: 4.3 mEq/L (ref 3.5–5.1)
SODIUM: 135 meq/L (ref 135–145)

## 2014-09-09 LAB — HEMOGLOBIN A1C: HEMOGLOBIN A1C: 6.7 % — AB (ref 4.6–6.5)

## 2014-09-09 NOTE — Progress Notes (Signed)
Pre visit review using our clinic review tool, if applicable. No additional management support is needed unless otherwise documented below in the visit note. 

## 2014-09-09 NOTE — Progress Notes (Signed)
Subjective:    Patient ID: Brent Taylor, male    DOB: 12-28-1940, 73 y.o.   MRN: [id number]  HPI 73 year old patient who is seen today for his six-month follow-up.  He is doing quite well and his only complaint is some mild constipation issues.  This has been treated with a high fiber diet with extra fiber supplements and he has done well He has type 2 diabetes.  Last hemoglobin A1c 6 months ago was 6.5. He remains on statin therapy, which she continues to do well. He has treated hypertension which includes ACE inhibitor as well as HCTZ.  Laboratory studies from 6 months ago were reviewed and creatinine was up to 1.6. He has had an eye examination this year  Past Medical History  Diagnosis Date  . Cancer     hx - melanoma  . Depression   . Hyperlipidemia   . Hypertension   . UTI (lower urinary tract infection)   . Colon polyps   . Diverticula, colon   . Chicken pox     History   Social History  . Marital Status: Married    Spouse Name: N/A    Number of Children: N/A  . Years of Education: N/A   Occupational History  . Not on file.   Social History Main Topics  . Smoking status: Never Smoker   . Smokeless tobacco: Never Used  . Alcohol Use: No  . Drug Use: No  . Sexual Activity: Not on file   Other Topics Concern  . Not on file   Social History Narrative    Past Surgical History  Procedure Laterality Date  . Melanoma excision      LEFT CHEST  . Melanoma excision      No family history on file.  Allergies  Allergen Reactions  . Lipitor [Atorvastatin Calcium]     Muscle ache    Current Outpatient Prescriptions on File Prior to Visit  Medication Sig Dispense Refill  . clopidogrel (PLAVIX) 75 MG tablet TAKE ONE TABLET (75MG  TOTAL) BY MOUTH DAILY 90 tablet 0  . esomeprazole (NEXIUM) 20 MG packet Take 20 mg by mouth daily before breakfast. OTC    . hydrochlorothiazide (HYDRODIURIL) 25 MG tablet TAKE 1 TABLET (25 MG TOTAL) BY MOUTH DAILY. 90 tablet 2  .  ibuprofen (ADVIL,MOTRIN) 200 MG tablet Take 600 mg by mouth every 6 (six) hours as needed.    . metFORMIN (GLUCOPHAGE) 500 MG tablet TAKE ONE TABLET (50OMG TOTAL) BY MOUTH DAILY WITH BREAKFAST 30 tablet 5  . pravastatin (PRAVACHOL) 40 MG tablet Take 1 tablet (40 mg total) by mouth daily. 90 tablet 1  . quinapril (ACCUPRIL) 40 MG tablet TAKE ONE TABLET (40MG  TOTAL)  BY MOUTH DAILY 90 tablet 1   No current facility-administered medications on file prior to visit.    BP 124/80 mmHg  Pulse 63  Temp(Src) 98.3 F (36.8 C) (Oral)  Resp 20  Ht 5' 10.5" (1.791 m)  Wt 243 lb (110.224 kg)  BMI 34.36 kg/m2  SpO2 96%      Review of Systems  Constitutional: Negative for fever, chills, appetite change and fatigue.  HENT: Negative for congestion, dental problem, ear pain, hearing loss, sore throat, tinnitus, trouble swallowing and voice change.   Eyes: Negative for pain, discharge and visual disturbance.  Respiratory: Negative for cough, chest tightness, wheezing and stridor.   Cardiovascular: Negative for chest pain, palpitations and leg swelling.  Gastrointestinal: Positive for constipation. Negative for nausea, vomiting, abdominal pain,  diarrhea, blood in stool and abdominal distention.  Genitourinary: Negative for urgency, hematuria, flank pain, discharge, difficulty urinating and genital sores.  Musculoskeletal: Negative for myalgias, back pain, joint swelling, arthralgias, gait problem and neck stiffness.  Skin: Negative for rash.  Neurological: Negative for dizziness, syncope, speech difficulty, weakness, numbness and headaches.  Hematological: Negative for adenopathy. Does not bruise/bleed easily.  Psychiatric/Behavioral: Negative for behavioral problems and dysphoric mood. The patient is not nervous/anxious.        Objective:   Physical Exam  Constitutional: He is oriented to person, place, and time. He appears well-developed.  HENT:  Head: Normocephalic.  Right Ear: External ear  normal.  Left Ear: External ear normal.  Eyes: Conjunctivae and EOM are normal.  Neck: Normal range of motion.  Cardiovascular: Normal rate and normal heart sounds.   Pulmonary/Chest: Breath sounds normal.  Abdominal: Bowel sounds are normal.  Musculoskeletal: Normal range of motion. He exhibits no edema or tenderness.  Neurological: He is alert and oriented to person, place, and time.  Psychiatric: He has a normal mood and affect. His behavior is normal.          Assessment & Plan:   Diabetes mellitus.  Will check a hemoglobin A1c Hypertension, well-controlled Recent increase in creatinine to 1.6.  We'll repeat Dyslipidemia.  Continue statin therapy Coronary artery disease, stable History colonic polyps.  Last colonoscopy 2011  CPX 6 months

## 2014-09-09 NOTE — Patient Instructions (Signed)
Limit your sodium (Salt) intake    It is important that you exercise regularly, at least 20 minutes 3 to 4 times per week.  If you develop chest pain or shortness of breath seek  medical attention.  Return in 6 months for follow-up  

## 2014-09-12 ENCOUNTER — Ambulatory Visit: Payer: Medicare Other | Admitting: Internal Medicine

## 2014-09-28 ENCOUNTER — Encounter: Payer: Self-pay | Admitting: Internal Medicine

## 2014-10-16 DIAGNOSIS — L814 Other melanin hyperpigmentation: Secondary | ICD-10-CM | POA: Diagnosis not present

## 2014-10-16 DIAGNOSIS — Z85820 Personal history of malignant melanoma of skin: Secondary | ICD-10-CM | POA: Diagnosis not present

## 2014-10-16 DIAGNOSIS — D1801 Hemangioma of skin and subcutaneous tissue: Secondary | ICD-10-CM | POA: Diagnosis not present

## 2014-10-16 DIAGNOSIS — L821 Other seborrheic keratosis: Secondary | ICD-10-CM | POA: Diagnosis not present

## 2014-10-16 DIAGNOSIS — L570 Actinic keratosis: Secondary | ICD-10-CM | POA: Diagnosis not present

## 2014-10-16 DIAGNOSIS — Z85828 Personal history of other malignant neoplasm of skin: Secondary | ICD-10-CM | POA: Diagnosis not present

## 2014-11-25 ENCOUNTER — Other Ambulatory Visit: Payer: Self-pay | Admitting: Internal Medicine

## 2015-01-14 ENCOUNTER — Other Ambulatory Visit: Payer: Self-pay | Admitting: Internal Medicine

## 2015-01-14 DIAGNOSIS — K048 Radicular cyst: Secondary | ICD-10-CM | POA: Diagnosis not present

## 2015-01-14 DIAGNOSIS — K121 Other forms of stomatitis: Secondary | ICD-10-CM | POA: Diagnosis not present

## 2015-01-20 ENCOUNTER — Encounter: Payer: Medicare Other | Admitting: Internal Medicine

## 2015-01-25 ENCOUNTER — Other Ambulatory Visit: Payer: Self-pay | Admitting: Internal Medicine

## 2015-02-27 ENCOUNTER — Other Ambulatory Visit: Payer: Self-pay | Admitting: Internal Medicine

## 2015-03-07 ENCOUNTER — Ambulatory Visit (INDEPENDENT_AMBULATORY_CARE_PROVIDER_SITE_OTHER): Payer: Medicare Other | Admitting: Internal Medicine

## 2015-03-07 ENCOUNTER — Encounter: Payer: Self-pay | Admitting: Internal Medicine

## 2015-03-07 VITALS — BP 180/100 | HR 63 | Temp 98.2°F | Resp 19 | Ht 70.5 in | Wt 247.0 lb

## 2015-03-07 DIAGNOSIS — E1159 Type 2 diabetes mellitus with other circulatory complications: Secondary | ICD-10-CM | POA: Diagnosis not present

## 2015-03-07 DIAGNOSIS — I10 Essential (primary) hypertension: Secondary | ICD-10-CM | POA: Diagnosis not present

## 2015-03-07 NOTE — Progress Notes (Signed)
Pre visit review using our clinic review tool, if applicable. No additional management support is needed unless otherwise documented below in the visit note. 

## 2015-03-07 NOTE — Progress Notes (Signed)
Subjective:    Patient ID: Brent Taylor, male    DOB: [birth year], 74 y.o.   MRN: [id number]  HPI  74 year old patient who is seen today as a work in.  3 days ago he felt a bit sluggish and was noted have blood pressure of 120/60.  He has been off all antihypertensive medications for the past 3 days. He has a history of diabetes, dyslipidemia.  He has coronary artery disease which has been stable.  Past Medical History  Diagnosis Date  . Cancer     hx - melanoma  . Depression   . Hyperlipidemia   . Hypertension   . UTI (lower urinary tract infection)   . Colon polyps   . Diverticula, colon   . Chicken pox     History   Social History  . Marital Status: Married    Spouse Name: N/A  . Number of Children: N/A  . Years of Education: N/A   Occupational History  . Not on file.   Social History Main Topics  . Smoking status: Never Smoker   . Smokeless tobacco: Never Used  . Alcohol Use: No  . Drug Use: No  . Sexual Activity: Not on file   Other Topics Concern  . Not on file   Social History Narrative    Past Surgical History  Procedure Laterality Date  . Melanoma excision      LEFT CHEST  . Melanoma excision      No family history on file.  Allergies  Allergen Reactions  . Lipitor [Atorvastatin Calcium]     Muscle ache    Current Outpatient Prescriptions on File Prior to Visit  Medication Sig Dispense Refill  . clopidogrel (PLAVIX) 75 MG tablet TAKE ONE TABLET (75MG  TOTAL) BY MOUTH DAILY 90 tablet 1  . esomeprazole (NEXIUM) 20 MG packet Take 20 mg by mouth daily before breakfast. OTC    . hydrochlorothiazide (HYDRODIURIL) 25 MG tablet TAKE 1 TABLET (25 MG TOTAL) BY MOUTH DAILY. 90 tablet 0  . ibuprofen (ADVIL,MOTRIN) 200 MG tablet Take 600 mg by mouth every 6 (six) hours as needed.    . metFORMIN (GLUCOPHAGE) 500 MG tablet TAKE ONE TABLET (50OMG TOTAL) BY MOUTH DAILY WITH BREAKFAST 30 tablet 5  . pravastatin (PRAVACHOL) 40 MG tablet TAKE 1 TABLET (40 MG  TOTAL) BY MOUTH DAILY. 90 tablet 1  . quinapril (ACCUPRIL) 40 MG tablet TAKE ONE TABLET (40MG  TOTAL)  BY MOUTH DAILY 90 tablet 0   No current facility-administered medications on file prior to visit.    BP 180/100 mmHg  Pulse 63  Temp(Src) 98.2 F (36.8 C)  Resp 19  Ht 5' 10.5" (1.791 m)  Wt 247 lb (112.038 kg)  BMI 34.93 kg/m2     Review of Systems  Constitutional: Negative for fever, chills, appetite change and fatigue.  HENT: Negative for congestion, dental problem, ear pain, hearing loss, sore throat, tinnitus, trouble swallowing and voice change.   Eyes: Negative for pain, discharge and visual disturbance.  Respiratory: Negative for cough, chest tightness, wheezing and stridor.   Cardiovascular: Negative for chest pain, palpitations and leg swelling.  Gastrointestinal: Negative for nausea, vomiting, abdominal pain, diarrhea, constipation, blood in stool and abdominal distention.  Genitourinary: Negative for urgency, hematuria, flank pain, discharge, difficulty urinating and genital sores.  Musculoskeletal: Negative for myalgias, back pain, joint swelling, arthralgias, gait problem and neck stiffness.  Skin: Negative for rash.  Neurological: Positive for weakness. Negative for dizziness, syncope, speech difficulty, numbness and  headaches.  Hematological: Negative for adenopathy. Does not bruise/bleed easily.  Psychiatric/Behavioral: Negative for behavioral problems and dysphoric mood. The patient is not nervous/anxious.        Objective:   Physical Exam  Constitutional: He is oriented to person, place, and time. He appears well-developed.  Blood pressure 180/100  HENT:  Head: Normocephalic.  Right Ear: External ear normal.  Left Ear: External ear normal.  Eyes: Conjunctivae and EOM are normal.  Neck: Normal range of motion.  Cardiovascular: Normal rate and normal heart sounds.   Frequent ectopics  Pulmonary/Chest: Breath sounds normal.  Abdominal: Bowel sounds are  normal.  Musculoskeletal: Normal range of motion. He exhibits no edema or tenderness.  Neurological: He is alert and oriented to person, place, and time.  Psychiatric: He has a normal mood and affect. His behavior is normal.          Assessment & Plan:   Hypertension.  Poorly controlled off medications.  Will resume blood pressure medications.  Will continue close home blood pressure monitoring. Diabetes, coronary artery disease  Recheck 4 weeks

## 2015-03-07 NOTE — Patient Instructions (Signed)
Limit your sodium (Salt) intake    It is important that you exercise regularly, at least 20 minutes 3 to 4 times per week.  If you develop chest pain or shortness of breath seek  medical attention.  Resume all blood pressure medications  Please check your blood pressure on a regular basis.  If it is consistently greater than 150/90, please make an office appointment.  Return in 4 weeks for follow-up

## 2015-03-12 ENCOUNTER — Other Ambulatory Visit: Payer: Self-pay | Admitting: Internal Medicine

## 2015-03-14 ENCOUNTER — Ambulatory Visit (INDEPENDENT_AMBULATORY_CARE_PROVIDER_SITE_OTHER): Payer: Medicare Other | Admitting: Family

## 2015-03-14 ENCOUNTER — Encounter: Payer: Medicare Other | Admitting: Internal Medicine

## 2015-03-14 ENCOUNTER — Encounter: Payer: Self-pay | Admitting: Family

## 2015-03-14 VITALS — BP 142/80 | HR 70 | Temp 98.8°F | Resp 20 | Ht 70.0 in | Wt 243.3 lb

## 2015-03-14 DIAGNOSIS — M62830 Muscle spasm of back: Secondary | ICD-10-CM

## 2015-03-14 MED ORDER — METHYLPREDNISOLONE ACETATE 80 MG/ML IJ SUSP
80.0000 mg | Freq: Once | INTRAMUSCULAR | Status: AC
  Administered 2015-03-14: 80 mg via INTRAMUSCULAR

## 2015-03-14 MED ORDER — CYCLOBENZAPRINE HCL 10 MG PO TABS: 10.0000 mg | ORAL_TABLET | Freq: Three times a day (TID) | ORAL | Status: DC | PRN

## 2015-03-14 NOTE — Progress Notes (Signed)
Pre visit review using our clinic review tool, if applicable. No additional management support is needed unless otherwise documented below in the visit note. 

## 2015-03-14 NOTE — Progress Notes (Signed)
Subjective:    Patient ID: Brent Taylor, male    DOB: [birth year], 74 y.o.   MRN: [id number]  HPI 74 year old male hear today with complaints of back lower right back pain.  Patient states that he has had pain for 1 week, but it has increased today.  States that he hasn't done any heavy lifting or anything to injure his back.  Patient states that he current pain rate 10/10 and when it comes it is 'brief but takes his breath away.'  States that he doesn't know when the pain will come and what movement will cause the pain.  Patient states that he had been taking Motrin for back pain and last took Motrin 800 mg this morning at 6:00 am with no relief.   Review of Systems  HENT: Negative.   Eyes: Negative.        Patient wears glasses.  Respiratory: Negative.   Cardiovascular: Negative.   Gastrointestinal: Negative.   Endocrine: Negative.   Genitourinary: Negative.   Musculoskeletal: Positive for back pain.       Right lower-mid back  Skin: Negative.   Allergic/Immunologic:       Lipitor  Neurological: Negative.   Hematological: Negative.   Psychiatric/Behavioral: Negative.        Past Medical History  Diagnosis Date  . Cancer     hx - melanoma  . Depression   . Hyperlipidemia   . Hypertension   . UTI (lower urinary tract infection)   . Colon polyps   . Diverticula, colon   . Chicken pox     History   Social History  . Marital Status: Married    Spouse Name: N/A  . Number of Children: N/A  . Years of Education: N/A   Occupational History  . Not on file.   Social History Main Topics  . Smoking status: Never Smoker   . Smokeless tobacco: Never Used  . Alcohol Use: No  . Drug Use: No  . Sexual Activity: Not on file   Other Topics Concern  . Not on file   Social History Narrative    Past Surgical History  Procedure Laterality Date  . Melanoma excision      LEFT CHEST  . Melanoma excision      History reviewed. No pertinent family history.  Allergies    Allergen Reactions  . Lipitor [Atorvastatin Calcium]     Muscle ache    Current Outpatient Prescriptions on File Prior to Visit  Medication Sig Dispense Refill  . clopidogrel (PLAVIX) 75 MG tablet TAKE ONE TABLET (75MG  TOTAL) BY MOUTH DAILY 90 tablet 1  . esomeprazole (NEXIUM) 20 MG packet Take 20 mg by mouth daily before breakfast. OTC    . hydrochlorothiazide (HYDRODIURIL) 25 MG tablet TAKE 1 TABLET (25 MG TOTAL) BY MOUTH DAILY. 90 tablet 1  . ibuprofen (ADVIL,MOTRIN) 200 MG tablet Take 600 mg by mouth every 6 (six) hours as needed.    . metFORMIN (GLUCOPHAGE) 500 MG tablet TAKE ONE TABLET (50OMG TOTAL) BY MOUTH DAILY WITH BREAKFAST 30 tablet 5  . pravastatin (PRAVACHOL) 40 MG tablet TAKE 1 TABLET (40 MG TOTAL) BY MOUTH DAILY. 90 tablet 1  . quinapril (ACCUPRIL) 40 MG tablet TAKE ONE TABLET (40MG  TOTAL)  BY MOUTH DAILY 90 tablet 0   No current facility-administered medications on file prior to visit.    BP 142/80 mmHg  Pulse 70  Temp(Src) 98.8 F (37.1 C) (Oral)  Resp 20  Ht 5\' 10"  (  1.778 m)  Wt 243 lb 4.8 oz (110.36 kg)  BMI 34.91 kg/m2  SpO2 96%chart Objective:   Physical Exam  Constitutional: He is oriented to person, place, and time. He appears well-developed and well-nourished.  HENT:  Head: Normocephalic.  Eyes: Pupils are equal, round, and reactive to light.  Neck: Normal range of motion. Neck supple.  Pulmonary/Chest: Effort normal and breath sounds normal.  Abdominal: Soft. Bowel sounds are normal.  Musculoskeletal: He exhibits tenderness. He exhibits no edema.       Arms: Tenderness in right mid-lower back  Neurological: He is alert and oriented to person, place, and time. He has normal reflexes.  Skin: Skin is warm and dry.  Psychiatric: He has a normal mood and affect. His behavior is normal. Judgment and thought content normal.          Assessment & Plan:  [name] was seen today for acute visit.  Diagnoses and all orders for this visit:  Back  spasm Orders: -     methylPREDNISolone acetate (DEPO-MEDROL) injection 80 mg; Inject 1 mL (80 mg total) into the muscle once.  Other orders -     cyclobenzaprine (FLEXERIL) 10 MG tablet; Take 1 tablet (10 mg total) by mouth 3 (three) times daily as needed for muscle spasms.   Patient given Depo-Medrol 80 mg IM today in office. Patient told to check CBG's often because Depo-Medrol may increase blood glucose.  Patient given Rx for Flexeril 10 mg TID. Patient told that Flexeril may cause drownsiness.  Discharge instructions given for muscle spasms.  Patient instructed to call office if symptoms increase.

## 2015-03-14 NOTE — Patient Instructions (Signed)

## 2015-03-17 DIAGNOSIS — M9903 Segmental and somatic dysfunction of lumbar region: Secondary | ICD-10-CM | POA: Diagnosis not present

## 2015-03-17 DIAGNOSIS — M5440 Lumbago with sciatica, unspecified side: Secondary | ICD-10-CM | POA: Diagnosis not present

## 2015-03-17 DIAGNOSIS — M5430 Sciatica, unspecified side: Secondary | ICD-10-CM | POA: Diagnosis not present

## 2015-03-18 DIAGNOSIS — M5430 Sciatica, unspecified side: Secondary | ICD-10-CM | POA: Diagnosis not present

## 2015-03-18 DIAGNOSIS — M5440 Lumbago with sciatica, unspecified side: Secondary | ICD-10-CM | POA: Diagnosis not present

## 2015-03-18 DIAGNOSIS — M9903 Segmental and somatic dysfunction of lumbar region: Secondary | ICD-10-CM | POA: Diagnosis not present

## 2015-03-19 DIAGNOSIS — M5430 Sciatica, unspecified side: Secondary | ICD-10-CM | POA: Diagnosis not present

## 2015-03-19 DIAGNOSIS — M5440 Lumbago with sciatica, unspecified side: Secondary | ICD-10-CM | POA: Diagnosis not present

## 2015-03-19 DIAGNOSIS — M9903 Segmental and somatic dysfunction of lumbar region: Secondary | ICD-10-CM | POA: Diagnosis not present

## 2015-03-26 DIAGNOSIS — M5430 Sciatica, unspecified side: Secondary | ICD-10-CM | POA: Diagnosis not present

## 2015-03-26 DIAGNOSIS — M9903 Segmental and somatic dysfunction of lumbar region: Secondary | ICD-10-CM | POA: Diagnosis not present

## 2015-03-26 DIAGNOSIS — M5440 Lumbago with sciatica, unspecified side: Secondary | ICD-10-CM | POA: Diagnosis not present

## 2015-04-02 DIAGNOSIS — M9903 Segmental and somatic dysfunction of lumbar region: Secondary | ICD-10-CM | POA: Diagnosis not present

## 2015-04-02 DIAGNOSIS — M5440 Lumbago with sciatica, unspecified side: Secondary | ICD-10-CM | POA: Diagnosis not present

## 2015-04-02 DIAGNOSIS — M5430 Sciatica, unspecified side: Secondary | ICD-10-CM | POA: Diagnosis not present

## 2015-04-16 DIAGNOSIS — M9903 Segmental and somatic dysfunction of lumbar region: Secondary | ICD-10-CM | POA: Diagnosis not present

## 2015-04-16 DIAGNOSIS — Z85828 Personal history of other malignant neoplasm of skin: Secondary | ICD-10-CM | POA: Diagnosis not present

## 2015-04-16 DIAGNOSIS — D692 Other nonthrombocytopenic purpura: Secondary | ICD-10-CM | POA: Diagnosis not present

## 2015-04-16 DIAGNOSIS — L570 Actinic keratosis: Secondary | ICD-10-CM | POA: Diagnosis not present

## 2015-04-16 DIAGNOSIS — Z85820 Personal history of malignant melanoma of skin: Secondary | ICD-10-CM | POA: Diagnosis not present

## 2015-04-16 DIAGNOSIS — M5430 Sciatica, unspecified side: Secondary | ICD-10-CM | POA: Diagnosis not present

## 2015-04-16 DIAGNOSIS — M5440 Lumbago with sciatica, unspecified side: Secondary | ICD-10-CM | POA: Diagnosis not present

## 2015-04-16 DIAGNOSIS — L821 Other seborrheic keratosis: Secondary | ICD-10-CM | POA: Diagnosis not present

## 2015-04-30 ENCOUNTER — Ambulatory Visit (INDEPENDENT_AMBULATORY_CARE_PROVIDER_SITE_OTHER): Payer: Medicare Other | Admitting: Internal Medicine

## 2015-04-30 ENCOUNTER — Other Ambulatory Visit: Payer: Self-pay | Admitting: *Deleted

## 2015-04-30 ENCOUNTER — Encounter: Payer: Self-pay | Admitting: Internal Medicine

## 2015-04-30 VITALS — BP 120/80 | HR 65 | Temp 98.0°F | Resp 20 | Ht 70.25 in | Wt 238.0 lb

## 2015-04-30 DIAGNOSIS — E1169 Type 2 diabetes mellitus with other specified complication: Secondary | ICD-10-CM

## 2015-04-30 DIAGNOSIS — Z86010 Personal history of colonic polyps: Secondary | ICD-10-CM

## 2015-04-30 DIAGNOSIS — E785 Hyperlipidemia, unspecified: Secondary | ICD-10-CM | POA: Diagnosis not present

## 2015-04-30 DIAGNOSIS — R300 Dysuria: Secondary | ICD-10-CM

## 2015-04-30 DIAGNOSIS — Z0000 Encounter for general adult medical examination without abnormal findings: Secondary | ICD-10-CM

## 2015-04-30 DIAGNOSIS — I10 Essential (primary) hypertension: Secondary | ICD-10-CM | POA: Diagnosis not present

## 2015-04-30 LAB — COMPREHENSIVE METABOLIC PANEL
ALK PHOS: 48 U/L (ref 39–117)
ALT: 32 U/L (ref 0–53)
AST: 29 U/L (ref 0–37)
Albumin: 4.3 g/dL (ref 3.5–5.2)
BUN: 22 mg/dL (ref 6–23)
CO2: 30 meq/L (ref 19–32)
CREATININE: 1.22 mg/dL (ref 0.40–1.50)
Calcium: 9.4 mg/dL (ref 8.4–10.5)
Chloride: 104 mEq/L (ref 96–112)
GFR: 61.31 mL/min (ref >60.00)
GLUCOSE: 111 mg/dL — AB (ref 70–99)
POTASSIUM: 4.6 meq/L (ref 3.5–5.1)
Sodium: 140 mEq/L (ref 135–145)
Total Bilirubin: 0.6 mg/dL (ref 0.2–1.2)
Total Protein: 7 g/dL (ref 6.0–8.3)

## 2015-04-30 LAB — LIPID PANEL
Cholesterol: 147 mg/dL (ref 0–200)
HDL: 34.4 mg/dL — AB (ref >39.00)
LDL CALC: 87 mg/dL (ref 0–99)
NonHDL: 112.6
Total CHOL/HDL Ratio: 4
Triglycerides: 128 mg/dL (ref 0.0–149.0)
VLDL: 25.6 mg/dL (ref 0.0–40.0)

## 2015-04-30 LAB — CBC WITH DIFFERENTIAL/PLATELET
Basophils Absolute: 0 10*3/uL (ref 0.0–0.1)
Basophils Relative: 0.5 % (ref 0.0–3.0)
Eosinophils Absolute: 0.2 10*3/uL (ref 0.0–0.7)
Eosinophils Relative: 4.4 % (ref 0.0–5.0)
HCT: 46.7 % (ref 39.0–52.0)
Hemoglobin: 15.5 g/dL (ref 13.0–17.0)
LYMPHS PCT: 22 % (ref 12.0–46.0)
Lymphs Abs: 1.2 10*3/uL (ref 0.7–4.0)
MCHC: 33.2 g/dL (ref 30.0–36.0)
MCV: 85.6 fl (ref 78.0–100.0)
MONO ABS: 0.5 10*3/uL (ref 0.1–1.0)
MONOS PCT: 8.8 % (ref 3.0–12.0)
NEUTROS PCT: 64.3 % (ref 43.0–77.0)
Neutro Abs: 3.6 10*3/uL (ref 1.4–7.7)
PLATELETS: 205 10*3/uL (ref 150.0–400.0)
RBC: 5.46 Mil/uL (ref 4.22–5.81)
RDW: 16.2 % — AB (ref 11.5–15.5)
WBC: 5.6 10*3/uL (ref 4.0–10.5)

## 2015-04-30 LAB — POCT URINALYSIS DIPSTICK
Bilirubin, UA: NEGATIVE
GLUCOSE UA: NEGATIVE
Ketones, UA: NEGATIVE
Nitrite, UA: NEGATIVE
PH UA: 7
Protein, UA: NEGATIVE
Spec Grav, UA: 1.02
Urobilinogen, UA: 0.2

## 2015-04-30 LAB — TSH: TSH: 1.54 u[IU]/mL (ref 0.35–4.50)

## 2015-04-30 LAB — MICROALBUMIN / CREATININE URINE RATIO
CREATININE, U: 120 mg/dL
MICROALB UR: 0.8 mg/dL (ref 0.0–1.9)
MICROALB/CREAT RATIO: 0.7 mg/g (ref 0.0–30.0)

## 2015-04-30 LAB — HEMOGLOBIN A1C: Hgb A1c MFr Bld: 6.3 % (ref 4.6–6.5)

## 2015-04-30 MED ORDER — CIPROFLOXACIN HCL 500 MG PO TABS: 500.0000 mg | ORAL_TABLET | Freq: Two times a day (BID) | ORAL | Status: DC

## 2015-04-30 NOTE — Progress Notes (Signed)
Patient ID: Brent Taylor, male   DOB: 1940/10/31, 74 y.o.   MRN: [id number]  Subjective:    Patient ID: Brent Taylor, male    DOB: 1940/11/21, 74 y.o.   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  He has a history of coronary artery disease. Apparently due to multiple risk factors he underwent a stress test some years ago which led to a heart catheterization. He was told that he had a vessel with moderate blockage but did not require any mechanical intervention. He has no history of chest pain. Cardiac risk factors include hypertension for approximately 40 years. He has been on metformin for 3 years for type 2 diabetes. He states that he is followed annually due to a left popliteal artery aneurysm.  His last arterial Doppler, however, revealed tortuosity, but no aneurysm.  Medical records were reviewed. He had an  Ectatic left popliteal artery but no focal aneurysm. He denies any claudication.  In 2000 he underwent a left mastectomy for a melanoma and has been seen in dermatology annually. He has a history of colonic polyps and his last colonoscopy was in 2011. He has gastroesophageal reflux disease which has been complicated by stricture formation requiring dilatation on 2 or 3 prior occasions. He has a history depression and has been on Prozac for in excess of 10 years. He has never had any trial off this medication.  Social history married 2 children and 4 grandchildren retired Company secretary nonsmoker masters degree level of education  Family history father died age 9 he should tobacco use status post CABG Mother died age 84 history of hypertension  Medicare wellness:  1. Risk factors, based on past  M,S,F history-  patient has known moderate single-vessel coronary artery disease which remains asymptomatic. Card*factors include hypertension diabetes and  dyslipidemia   2.  Physical activities: [facility] No exertional chest pain or symptoms of claudication   3.  Depression/mood: history of depression which has been stable  4.  Hearing: no deficits   5.  ADL's:independent in all aspects of daily living   6.  Fall risk: low  7.  Home safety: no problems identified   8.  Height weight, and visual acuity;height and weight stable no change in visual acuity has had a recent eye examination  9.  Counseling:  10. Lab orders based on risk factors: laboratory profile including lipid panel and hemoglobin A1c will be reviewed   11. Referral : not appropriate at this time   12. Care plan: heart healthy diet regular exercise and modest weight loss all encouraged  13. Cognitive assessment:  alert and oriented with normal affect. No cognitive dysfunction   14.  Preventive services will include annual clinical exams with screening lab.  He will have annual eye examinations. We'll continue annual skin examinations.  Due to his risk of melanoma recurrence  15.  Provider list includes primary care medicine and ophthalmology.   Wt Readings from Last 3 Encounters:  04/30/15 238 lb (107.956 kg)  03/14/15 243 lb 4.8 oz (110.36 kg)  03/07/15 247 lb (112.038 kg)    Lab Results  Component Value Date   HGBA1C 6.7* 09/09/2014      Review of Systems  Constitutional: Negative for fever, chills, appetite change and fatigue.  HENT: Negative for congestion, dental problem, ear pain,  hearing loss, sore throat, tinnitus, trouble swallowing and voice change.   Eyes: Negative for pain, discharge and visual disturbance.  Respiratory: Negative for cough, chest tightness, wheezing and stridor.   Cardiovascular: Negative for chest pain, palpitations and leg swelling.  Gastrointestinal: Negative for nausea, vomiting, abdominal pain, diarrhea, constipation, blood in stool and abdominal distention.  Genitourinary: Negative for  urgency, hematuria, flank pain, discharge, difficulty urinating and genital sores.  Musculoskeletal: Negative for myalgias, back pain, joint swelling, arthralgias, gait problem and neck stiffness.       Recent back pain and sciatica, resolved  Skin: Negative for rash.  Neurological: Negative for dizziness, syncope, speech difficulty, weakness, numbness and headaches.  Hematological: Negative for adenopathy. Does not bruise/bleed easily.  Psychiatric/Behavioral: Negative for behavioral problems and dysphoric mood. The patient is not nervous/anxious.        Objective:   Physical Exam  Constitutional: He appears well-developed and well-nourished.  HENT:  Head: Normocephalic and atraumatic.  Right Ear: External ear normal.  Left Ear: External ear normal.  Nose: Nose normal.  Mouth/Throat: Oropharynx is clear and moist.  Eyes: Conjunctivae and EOM are normal. Pupils are equal, round, and reactive to light. No scleral icterus.  Neck: Normal range of motion. Neck supple. No JVD present. No thyromegaly present.  Cardiovascular: Regular rhythm, normal heart sounds and intact distal pulses.  Exam reveals no gallop and no friction rub.   No murmur heard. Dorsalis pedis pulses were nonpalpable but posterior tibial pulses were full bilaterally  Pulmonary/Chest: Effort normal and breath sounds normal. He exhibits no tenderness.  Status post left mastectomy  Abdominal: Soft. Bowel sounds are normal. He exhibits no distension and no mass. There is no tenderness.  Genitourinary: Prostate normal and penis normal. Guaiac negative stool.  Prostate +2 enlarged  Musculoskeletal: Normal range of motion. He exhibits no edema or tenderness.  Hammertoe deformity with high arches  Lymphadenopathy:    He has no cervical adenopathy.  Neurological: He is alert. He has normal reflexes. No cranial nerve deficit. Coordination normal.  Skin: Skin is warm and dry. No rash noted.  Psychiatric: He has a normal mood  and affect. His behavior is normal.          Assessment & Plan:   Preventive health examination Coronary artery disease. Continue aggressive risk factor modification Hypertension well controlled Type 2 diabetes. We'll check a hemoglobin A1c and urine for microalbumin Dyslipidemia.  Check a lipid profile Colonic polyps.  Last colonoscopy 2011 Gastroesophageal reflux disease with stricture formation. Continue PPI medication    Recheck 6 months weight loss low salt diet and exercise are encouraged

## 2015-04-30 NOTE — Patient Instructions (Addendum)
Limit your sodium (Salt) intake    It is important that you exercise regularly, at least 20 minutes 3 to 4 times per week.  If you develop chest pain or shortness of breath seek  medical attention.  You need to lose weight.  Consider a lower calorie diet and regular exercise.\  Return in 6 months for follow-up   Health Maintenance A healthy lifestyle and preventative care can promote health and wellness.  Maintain regular health, dental, and eye exams.  Eat a healthy diet. Foods like vegetables, fruits, whole grains, low-fat dairy products, and lean protein foods contain the nutrients you need and are low in calories. Decrease your intake of foods high in solid fats, added sugars, and salt. Get information about a proper diet from your health care provider, if necessary.  Regular physical exercise is one of the most important things you can do for your health. Most adults should get at least 150 minutes of moderate-intensity exercise (any activity that increases your heart rate and causes you to sweat) each week. In addition, most adults need muscle-strengthening exercises on 2 or more days a week.   Maintain a healthy weight. The body mass index (BMI) is a screening tool to identify possible weight problems. It provides an estimate of body fat based on height and weight. Your health care provider can find your BMI and can help you achieve or maintain a healthy weight. For males 20 years and older:  A BMI below 18.5 is considered underweight.  A BMI of 18.5 to 24.9 is normal.  A BMI of 25 to 29.9 is considered overweight.  A BMI of 30 and above is considered obese.  Maintain normal blood lipids and cholesterol by exercising and minimizing your intake of saturated fat. Eat a balanced diet with plenty of fruits and vegetables. Blood tests for lipids and cholesterol should begin at age 81 and be repeated every 5 years. If your lipid or cholesterol levels are high, you are over age 15, or  you are at high risk for heart disease, you may need your cholesterol levels checked more frequently.Ongoing high lipid and cholesterol levels should be treated with medicines if diet and exercise are not working.  If you smoke, find out from your health care provider how to quit. If you do not use tobacco, do not start.  Lung cancer screening is recommended for adults aged 36-80 years who are at high risk for developing lung cancer because of a history of smoking. A yearly low-dose CT scan of the lungs is recommended for people who have at least a 30-pack-year history of smoking and are current smokers or have quit within the past 15 years. A pack year of smoking is smoking an average of 1 pack of cigarettes a day for 1 year (for example, a 30-pack-year history of smoking could mean smoking 1 pack a day for 30 years or 2 packs a day for 15 years). Yearly screening should continue until the smoker has stopped smoking for at least 15 years. Yearly screening should be stopped for people who develop a health problem that would prevent them from having lung cancer treatment.  If you choose to drink alcohol, do not have more than 2 drinks per day. One drink is considered to be 12 oz (360 mL) of beer, 5 oz (150 mL) of wine, or 1.5 oz (45 mL) of liquor.  Avoid the use of street drugs. Do not share needles with anyone. Ask for help if you  need support or instructions about stopping the use of drugs.  High blood pressure causes heart disease and increases the risk of stroke. Blood pressure should be checked at least every 1-2 years. Ongoing high blood pressure should be treated with medicines if weight loss and exercise are not effective.  If you are 63-42 years old, ask your health care provider if you should take aspirin to prevent heart disease.  Diabetes screening involves taking a blood sample to check your fasting blood sugar level. This should be done once every 3 years after age 54 if you are at a  normal weight and without risk factors for diabetes. Testing should be considered at a younger age or be carried out more frequently if you are overweight and have at least 1 risk factor for diabetes.  Colorectal cancer can be detected and often prevented. Most routine colorectal cancer screening begins at the age of 48 and continues through age 64. However, your health care provider may recommend screening at an earlier age if you have risk factors for colon cancer. On a yearly basis, your health care provider may provide home test kits to check for hidden blood in the stool. A small camera at the end of a tube may be used to directly examine the colon (sigmoidoscopy or colonoscopy) to detect the earliest forms of colorectal cancer. Talk to your health care provider about this at age 68 when routine screening begins. A direct exam of the colon should be repeated every 5-10 years through age 72, unless early forms of precancerous polyps or small growths are found.  People who are at an increased risk for hepatitis B should be screened for this virus. You are considered at high risk for hepatitis B if:  You were born in a country where hepatitis B occurs often. Talk with your health care provider about which countries are considered high risk.  Your parents were born in a high-risk country and you have not received a shot to protect against hepatitis B (hepatitis B vaccine).  You have HIV or AIDS.  You use needles to inject street drugs.  You live with, or have sex with, someone who has hepatitis B.  You are a man who has sex with other men (MSM).  You get hemodialysis treatment.  You take certain medicines for conditions like cancer, organ transplantation, and autoimmune conditions.  Hepatitis C blood testing is recommended for all people born from [birth year] through 1965 and any individual with known risk factors for hepatitis C.  Healthy men should no longer receive prostate-specific antigen  (PSA) blood tests as part of routine cancer screening. Talk to your health care provider about prostate cancer screening.  Testicular cancer screening is not recommended for adolescents or adult males who have no symptoms. Screening includes self-exam, a health care provider exam, and other screening tests. Consult with your health care provider about any symptoms you have or any concerns you have about testicular cancer.  Practice safe sex. Use condoms and avoid high-risk sexual practices to reduce the spread of sexually transmitted infections (STIs).  You should be screened for STIs, including gonorrhea and chlamydia if:  You are sexually active and are younger than 24 years.  You are older than 24 years, and your health care provider tells you that you are at risk for this type of infection.  Your sexual activity has changed since you were last screened, and you are at an increased risk for chlamydia or gonorrhea. Ask your health  care provider if you are at risk.  If you are at risk of being infected with HIV, it is recommended that you take a prescription medicine daily to prevent HIV infection. This is called pre-exposure prophylaxis (PrEP). You are considered at risk if:  You are a man who has sex with other men (MSM).  You are a heterosexual man who is sexually active with multiple partners.  You take drugs by injection.  You are sexually active with a partner who has HIV.  Talk with your health care provider about whether you are at high risk of being infected with HIV. If you choose to begin PrEP, you should first be tested for HIV. You should then be tested every 3 months for as long as you are taking PrEP.  Use sunscreen. Apply sunscreen liberally and repeatedly throughout the day. You should seek shade when your shadow is shorter than you. Protect yourself by wearing long sleeves, pants, a wide-brimmed hat, and sunglasses year round whenever you are outdoors.  Tell your health  care provider of new moles or changes in moles, especially if there is a change in shape or color. Also, tell your health care provider if a mole is larger than the size of a pencil eraser.  A one-time screening for abdominal aortic aneurysm (AAA) and surgical repair of large AAAs by ultrasound is recommended for men aged 71-75 years who are current or former smokers.  Stay current with your vaccines (immunizations). Document Released: 03/25/2008 Document Revised: 10/02/2013 Document Reviewed: 02/22/2011 [facility] Patient Information 2015 [place], [place]. This information is not intended to replace advice given to you by your health care provider. Make sure you discuss any questions you have with your health care provider.

## 2015-04-30 NOTE — Progress Notes (Signed)
Pre visit review using our clinic review tool, if applicable. No additional management support is needed unless otherwise documented below in the visit note. 

## 2015-06-06 ENCOUNTER — Other Ambulatory Visit: Payer: Self-pay | Admitting: Internal Medicine

## 2015-07-28 ENCOUNTER — Ambulatory Visit (INDEPENDENT_AMBULATORY_CARE_PROVIDER_SITE_OTHER): Payer: Medicare Other | Admitting: *Deleted

## 2015-07-28 ENCOUNTER — Telehealth: Payer: Self-pay | Admitting: Internal Medicine

## 2015-07-28 DIAGNOSIS — Z23 Encounter for immunization: Secondary | ICD-10-CM

## 2015-07-28 DIAGNOSIS — Z86010 Personal history of colonic polyps: Secondary | ICD-10-CM

## 2015-07-28 NOTE — Telephone Encounter (Signed)
Spoke to pt, asked him where he had Colonoscopy before? Pt said in [place]. Told pt okay will send order for referral to GI for Colonoscopy and someone will contact for an appt. In regards to skip heartbeat I would advise you make an appt to see Dr. Raliegh Ip to evaluate. Pt verbalized understanding and will call back and schedule appt. Order for referral to GI done.

## 2015-07-28 NOTE — Telephone Encounter (Signed)
Patient is interested in setting up his colonoscopy.  Patient also states he has previously had a skip in his heartbeat, and when he takes his blood pressure sometimes he gets an error because of it.  He is wondering if he needs to see a cardiologist.

## 2015-08-23 ENCOUNTER — Other Ambulatory Visit: Payer: Self-pay | Admitting: Internal Medicine

## 2015-08-27 ENCOUNTER — Encounter: Payer: Self-pay | Admitting: Internal Medicine

## 2015-08-31 ENCOUNTER — Other Ambulatory Visit: Payer: Self-pay | Admitting: Internal Medicine

## 2015-09-08 ENCOUNTER — Telehealth: Payer: Self-pay | Admitting: *Deleted

## 2015-09-08 ENCOUNTER — Encounter: Payer: Self-pay | Admitting: Adult Health

## 2015-09-08 ENCOUNTER — Ambulatory Visit (INDEPENDENT_AMBULATORY_CARE_PROVIDER_SITE_OTHER): Payer: Medicare Other | Admitting: Adult Health

## 2015-09-08 VITALS — BP 140/70 | HR 105 | Temp 98.2°F | Wt 238.0 lb

## 2015-09-08 DIAGNOSIS — R001 Bradycardia, unspecified: Secondary | ICD-10-CM | POA: Diagnosis not present

## 2015-09-08 DIAGNOSIS — J209 Acute bronchitis, unspecified: Secondary | ICD-10-CM | POA: Diagnosis not present

## 2015-09-08 MED ORDER — HYDROCODONE-HOMATROPINE 5-1.5 MG/5ML PO SYRP: 5.0000 mL | ORAL_SOLUTION | Freq: Three times a day (TID) | ORAL | Status: DC | PRN

## 2015-09-08 MED ORDER — METHYLPREDNISOLONE 4 MG PO TBPK: ORAL_TABLET | ORAL | Status: DC

## 2015-09-08 NOTE — Progress Notes (Signed)
Subjective:    Patient ID: Brent Taylor, male    DOB: 12/09/1940, 74 y.o.   MRN: [id number]  HPI  74 year old male who presents to the office today for chest congestion for one week. Per patient his symptoms include " tickle in throat, congestion in upper chest/sternum, and dry cough. He also endorses sinus pressure but his biggest complaint is the cough.   Denies fevers, nausea, SOB, and chest pain.   He has tried Mucinex and thera flu - which helped with the cough and he was able to sleep.   During triage there was concern that the patient was bradycardic with a rate of 40. Reviewing old EKG's it was noticed that the patient has a history of this. EKG today showed a rate of 80 " possible a fib"  With non specific QRS widening and anterior fascicular block. He is asymptomatic. He does have a history of cardiac cath and was told that he has a vessel with moderate blockage but did not require any mechanical intervention. His last arterial Doppler,revealed tortuosity, but no aneurysm. He was followed by cardiology in [place] and would like to establish care with cardiology here.     Review of Systems  Constitutional: Negative.   HENT: Positive for congestion and sinus pressure. Negative for ear pain, facial swelling, postnasal drip, rhinorrhea, sneezing and sore throat.   Eyes: Negative.   Respiratory: Positive for cough and chest tightness. Negative for choking.   Cardiovascular: Negative.   Neurological: Negative.   Hematological: Negative for adenopathy.  Psychiatric/Behavioral: Positive for sleep disturbance.  All other systems reviewed and are negative.      Objective:   Physical Exam  Constitutional: He is oriented to person, place, and time. He appears well-developed and well-nourished. No distress.  HENT:  Head: Normocephalic and atraumatic.  Right Ear: External ear normal.  Left Ear: External ear normal.  Nose: Nose normal.  Mouth/Throat: Oropharynx is clear and moist. No  oropharyngeal exudate.  TM's visualized. No signs of infection  Eyes: Conjunctivae and EOM are normal. Pupils are equal, round, and reactive to light. Right eye exhibits no discharge.  Neck: Normal range of motion. Neck supple.  Cardiovascular: Regular rhythm and intact distal pulses.  Exam reveals no gallop and no friction rub.   No murmur heard. Is not in A fib during exam. He does have an ectopic beat. Heart rate 62 radial palp.   Pulmonary/Chest: Effort normal and breath sounds normal. No respiratory distress. He has no wheezes. He has no rales. He exhibits no tenderness.  Increased expiratory phase  Musculoskeletal: Normal range of motion. He exhibits no edema or tenderness.  Lymphadenopathy:    He has no cervical adenopathy.  Neurological: He is alert and oriented to person, place, and time.  Skin: Skin is warm and dry. No rash noted. He is not diaphoretic. No erythema. No pallor.  Psychiatric: He has a normal mood and affect. His behavior is normal. Judgment and thought content normal.  Nursing note and vitals reviewed.      Assessment & Plan:  1. Bradycardia - EKG 12-Lead - rate 80. Not in afib. + ectopic beat.  - Ambulatory referral to Cardiology  2. Acute bronchitis, unspecified organism - HYDROcodone-homatropine (HYCODAN) 5-1.5 MG/5ML syrup; Take 5 mLs by mouth every 8 (eight) hours as needed for cough.  Dispense: 120 mL; Refill: 0 - methylPREDNISolone (MEDROL DOSEPAK) 4 MG TBPK tablet; Take as directed  Dispense: 21 tablet; Refill: 0 - Follow up if  no improvement.

## 2015-09-08 NOTE — Patient Instructions (Addendum)
It was great meeting you today!  Your exam is consistent with bronchitis. Please take the Medrol Dose Pack as directed.   Use the cough syrup at night as it will make you sleepy. During the day use Mucinex.   Follow up if no improvement over the next week. Follow up sooner if your symptoms become worse.     Acute Bronchitis Bronchitis is inflammation of the airways that extend from the windpipe into the lungs (bronchi). The inflammation often causes mucus to develop. This leads to a cough, which is the most common symptom of bronchitis.  In acute bronchitis, the condition usually develops suddenly and goes away over time, usually in a couple weeks. Smoking, allergies, and asthma can make bronchitis worse. Repeated episodes of bronchitis may cause further lung problems.  CAUSES Acute bronchitis is most often caused by the same virus that causes a cold. The virus can spread from person to person (contagious) through coughing, sneezing, and touching contaminated objects. SIGNS AND SYMPTOMS   Cough.   Fever.   Coughing up mucus.   Body aches.   Chest congestion.   Chills.   Shortness of breath.   Sore throat.  DIAGNOSIS  Acute bronchitis is usually diagnosed through a physical exam. Your health care provider will also ask you questions about your medical history. Tests, such as chest X-rays, are sometimes done to rule out other conditions.  TREATMENT  Acute bronchitis usually goes away in a couple weeks. Oftentimes, no medical treatment is necessary. Medicines are sometimes given for relief of fever or cough. Antibiotic medicines are usually not needed but may be prescribed in certain situations. In some cases, an inhaler may be recommended to help reduce shortness of breath and control the cough. A cool mist vaporizer may also be used to help thin bronchial secretions and make it easier to clear the chest.  HOME CARE INSTRUCTIONS  Get plenty of rest.   Drink enough  fluids to keep your urine clear or pale yellow (unless you have a medical condition that requires fluid restriction). Increasing fluids may help thin your respiratory secretions (sputum) and reduce chest congestion, and it will prevent dehydration.   Take medicines only as directed by your health care provider.  If you were prescribed an antibiotic medicine, finish it all even if you start to feel better.  Avoid smoking and secondhand smoke. Exposure to cigarette smoke or irritating chemicals will make bronchitis worse. If you are a smoker, consider using nicotine gum or skin patches to help control withdrawal symptoms. Quitting smoking will help your lungs heal faster.   Reduce the chances of another bout of acute bronchitis by washing your hands frequently, avoiding people with cold symptoms, and trying not to touch your hands to your mouth, nose, or eyes.   Keep all follow-up visits as directed by your health care provider.  SEEK MEDICAL CARE IF: Your symptoms do not improve after 1 week of treatment.  SEEK IMMEDIATE MEDICAL CARE IF:  You develop an increased fever or chills.   You have chest pain.   You have severe shortness of breath.  You have bloody sputum.   You develop dehydration.  You faint or repeatedly feel like you are going to pass out.  You develop repeated vomiting.  You develop a severe headache. MAKE SURE YOU:   Understand these instructions.  Will watch your condition.  Will get help right away if you are not doing well or get worse.   This information is  not intended to replace advice given to you by your health care provider. Make sure you discuss any questions you have with your health care provider.   Document Released: 11/04/2004 Document Revised: 10/18/2014 Document Reviewed: 03/20/2013 Elsevier Interactive Patient Education [employer].

## 2015-09-08 NOTE — Progress Notes (Signed)
Pre visit review using our clinic review tool, if applicable. No additional management support is needed unless otherwise documented below in the visit note. 

## 2015-09-08 NOTE — Telephone Encounter (Signed)
Patient came in with concerns of "having bronchitis." When triaged, patient states symptoms of tickle in throat, cough, and feeling of needing to get up congestion in sterum area but nothing would come up. Symptoms x1 week. Denied fever, chills, nausea, SHOB, and chest pain. Vital signs during triage: HR 41 (radial palpated) Temp 98.6 BP 130/60. Patient states has taken Mucinex and Thermaflu OTC; provided mild relief but short-lasting. Spoke with Dr. Raliegh Ip and he requests patient be seen. Patient has appointment to see [name] this morning at 9:00am.

## 2015-09-09 ENCOUNTER — Encounter: Payer: Self-pay | Admitting: Internal Medicine

## 2015-09-09 ENCOUNTER — Ambulatory Visit (INDEPENDENT_AMBULATORY_CARE_PROVIDER_SITE_OTHER): Payer: Medicare Other | Admitting: Internal Medicine

## 2015-09-09 VITALS — BP 140/80 | Temp 98.1°F | Ht 70.25 in | Wt 235.0 lb

## 2015-09-09 DIAGNOSIS — I2510 Atherosclerotic heart disease of native coronary artery without angina pectoris: Secondary | ICD-10-CM

## 2015-09-09 DIAGNOSIS — H9222 Otorrhagia, left ear: Secondary | ICD-10-CM

## 2015-09-09 DIAGNOSIS — I10 Essential (primary) hypertension: Secondary | ICD-10-CM

## 2015-09-09 NOTE — Progress Notes (Signed)
Subjective:    Patient ID: Brent Taylor, male    DOB: 06-Dec-1940, 74 y.o.   MRN: [id number]  HPI  74 year old patient who has coronary artery disease and has been on Plavix.  He presents today as a walk-in complaining of refractory bleeding from the left earlobe.  He states the bleeding was fairly spontaneous while sitting watching a football game Medical problems include type 2 diabetes and essential hypertension  Lab Results  Component Value Date   HGBA1C 6.3 04/30/2015    Past Medical History  Diagnosis Date  . Cancer (HCC)     hx - melanoma  . Depression   . Hyperlipidemia   . Hypertension   . UTI (lower urinary tract infection)   . Colon polyps   . Diverticula, colon   . Chicken pox     Social History   Social History  . Marital Status: Married    Spouse Name: N/A  . Number of Children: N/A  . Years of Education: N/A   Occupational History  . Not on file.   Social History Main Topics  . Smoking status: Never Smoker   . Smokeless tobacco: Never Used  . Alcohol Use: No  . Drug Use: No  . Sexual Activity: Not on file   Other Topics Concern  . Not on file   Social History Narrative    Past Surgical History  Procedure Laterality Date  . Melanoma excision      LEFT CHEST  . Melanoma excision      No family history on file.  Allergies  Allergen Reactions  . Lipitor [Atorvastatin Calcium]     Muscle ache    Current Outpatient Prescriptions on File Prior to Visit  Medication Sig Dispense Refill  . clopidogrel (PLAVIX) 75 MG tablet TAKE ONE TABLET (75MG  TOTAL) BY MOUTH DAILY 90 tablet 1  . cyclobenzaprine (FLEXERIL) 10 MG tablet Take 1 tablet (10 mg total) by mouth 3 (three) times daily as needed for muscle spasms. 30 tablet 0  . esomeprazole (NEXIUM) 20 MG packet Take 20 mg by mouth daily before breakfast. OTC    . hydrochlorothiazide (HYDRODIURIL) 25 MG tablet TAKE 1 TABLET (25 MG TOTAL) BY MOUTH DAILY. 90 tablet 1  . HYDROcodone-homatropine  (HYCODAN) 5-1.5 MG/5ML syrup Take 5 mLs by mouth every 8 (eight) hours as needed for cough. 120 mL 0  . ibuprofen (ADVIL,MOTRIN) 200 MG tablet Take 600 mg by mouth every 6 (six) hours as needed.    . metFORMIN (GLUCOPHAGE) 500 MG tablet TAKE ONE TABLET (50OMG TOTAL) BY MOUTH DAILY WITH BREAKFAST 30 tablet 5  . methylPREDNISolone (MEDROL DOSEPAK) 4 MG TBPK tablet Take as directed 21 tablet 0  . pravastatin (PRAVACHOL) 40 MG tablet TAKE 1 TABLET (40 MG TOTAL) BY MOUTH DAILY. 90 tablet 1  . quinapril (ACCUPRIL) 40 MG tablet TAKE ONE TABLET (40MG  TOTAL)  BY MOUTH DAILY 90 tablet 1   No current facility-administered medications on file prior to visit.    BP 140/80 mmHg  Temp(Src) 98.1 F (36.7 C) (Oral)  Ht 5' 10.25" (1.784 m)  Wt 235 lb (106.595 kg)  BMI 33.49 kg/m2     Review of Systems  Constitutional: Negative for fever, chills, appetite change and fatigue.  HENT: Negative for congestion, dental problem, ear pain, hearing loss, sore throat, tinnitus, trouble swallowing and voice change.   Eyes: Negative for pain, discharge and visual disturbance.  Respiratory: Negative for cough, chest tightness, wheezing and stridor.   Cardiovascular: Negative for  chest pain, palpitations and leg swelling.  Gastrointestinal: Negative for nausea, vomiting, abdominal pain, diarrhea, constipation, blood in stool and abdominal distention.  Genitourinary: Negative for urgency, hematuria, flank pain, discharge, difficulty urinating and genital sores.  Musculoskeletal: Negative for myalgias, back pain, joint swelling, arthralgias, gait problem and neck stiffness.  Skin: Negative for rash.  Neurological: Negative for dizziness, syncope, speech difficulty, weakness, numbness and headaches.  Hematological: Negative for adenopathy. Bruises/bleeds easily.  Psychiatric/Behavioral: Negative for behavioral problems and dysphoric mood. The patient is not nervous/anxious.        Objective:   Physical Exam    Constitutional: He appears well-developed and well-nourished. No distress.  Blood pressure 140/80  Skin:  Bleeding noted from the left lower earlobe area          Assessment & Plan:   Left ear bleeding.  Controlled with a chemical cautery as well as direct pressure.  Local wound care discussed.  Patient was given several "cautery sticks"  for possible future use if reoccurs Essential hypertension, stable Diabetes

## 2015-09-09 NOTE — Progress Notes (Signed)
Pre visit review using our clinic review tool, if applicable. No additional management support is needed unless otherwise documented below in the visit note. 

## 2015-09-09 NOTE — Patient Instructions (Signed)
Do not wash the external left ear for the next 24 hours  Apply pressure and cautery as discussed in the office if bleeding reoccurs  If bleeding reoccurs.  Call the office and hold Plavix

## 2015-09-26 ENCOUNTER — Ambulatory Visit (INDEPENDENT_AMBULATORY_CARE_PROVIDER_SITE_OTHER): Payer: Medicare Other | Admitting: Cardiology

## 2015-09-26 ENCOUNTER — Encounter: Payer: Self-pay | Admitting: Cardiology

## 2015-09-26 VITALS — BP 138/68 | HR 76 | Ht 72.0 in | Wt 242.6 lb

## 2015-09-26 DIAGNOSIS — R001 Bradycardia, unspecified: Secondary | ICD-10-CM

## 2015-09-26 DIAGNOSIS — I10 Essential (primary) hypertension: Secondary | ICD-10-CM

## 2015-09-26 DIAGNOSIS — I2510 Atherosclerotic heart disease of native coronary artery without angina pectoris: Secondary | ICD-10-CM

## 2015-09-26 NOTE — Patient Instructions (Signed)
Your physician recommends that you schedule a follow-up appointment in: As Needed  Your physician has recommended that you wear a 48 hr holter monitor. Holter monitors are medical devices that record the heart's electrical activity. Doctors most often use these monitors to diagnose arrhythmias. Arrhythmias are problems with the speed or rhythm of the heartbeat. The monitor is a small, portable device. You can wear one while you do your normal daily activities. This is usually used to diagnose what is causing palpitations/syncope (passing out).

## 2015-09-26 NOTE — Progress Notes (Signed)
Cardiology Office Note   Date:  09/26/2015   ID:  Brent Taylor, DOB 1940-10-30, MRN [id number]  PCP:  [name], MD  Cardiologist:   Minus Breeding, MD   Chief Complaint  Patient presents with  . Bradycardia      History of Present Illness: Brent Taylor is a 74 y.o. male who presents for evaluation of bradycardia.  He keeps a check on his blood pressure.  I reviewed his BP diary.  His BP fluctuates.  However, he is most concerned because his HR will sometimes be in the 30s or 40s.  Very quickly it will recover to normal.  He does not feel any different when his heart rate is low. He has had known ectopy for years. He's been told he has a skip in his heart. He doesn't have any symptoms with this. He is active exercising at the [facility] 3 times per week. The patient denies any new symptoms such as chest discomfort, neck or arm discomfort. There has been no new shortness of breath, PND or orthopnea. There have been no reported palpitations, presyncope or syncope.  Of note he did have a cardiac catheterization when he was living in [place] maybe 5 or 6 years ago but I'm unclear as to why this was done. He's never had any chest pain.  Past Medical History  Diagnosis Date  . Cancer (HCC)     hx - melanoma  . Depression     No need for therapy.  Improved  . Hyperlipidemia   . Hypertension   . UTI (lower urinary tract infection)   . Colon polyps   . Diverticula, colon   . Chicken pox     Past Surgical History  Procedure Laterality Date  . Melanoma excision      LEFT CHEST  . Melanoma excision       Current Outpatient Prescriptions  Medication Sig Dispense Refill  . clopidogrel (PLAVIX) 75 MG tablet TAKE ONE TABLET (75MG  TOTAL) BY MOUTH DAILY 90 tablet 1  . esomeprazole (NEXIUM) 20 MG packet Take 20 mg by mouth daily before breakfast. OTC    . hydrochlorothiazide (HYDRODIURIL) 25 MG tablet TAKE 1 TABLET (25 MG TOTAL) BY MOUTH DAILY. 90 tablet 1  . ibuprofen  (ADVIL,MOTRIN) 200 MG tablet Take 600 mg by mouth every 6 (six) hours as needed.    . metFORMIN (GLUCOPHAGE) 500 MG tablet TAKE ONE TABLET (50OMG TOTAL) BY MOUTH DAILY WITH BREAKFAST 30 tablet 5  . pravastatin (PRAVACHOL) 40 MG tablet TAKE 1 TABLET (40 MG TOTAL) BY MOUTH DAILY. 90 tablet 1  . quinapril (ACCUPRIL) 40 MG tablet TAKE ONE TABLET (40MG  TOTAL)  BY MOUTH DAILY 90 tablet 1   No current facility-administered medications for this visit.    Allergies:   Lipitor    Social History:  The patient  reports that he has never smoked. He has never used smokeless tobacco. He reports that he does not drink alcohol or use illicit drugs.   Family History:  The patient's family history includes Heart Problems in his mother.    ROS:  Please see the history of present illness.   Otherwise, review of systems are positive for none.   All other systems are reviewed and negative.    PHYSICAL EXAM: VS:  BP 138/68 mmHg  Pulse 76  Ht 6' (1.829 m)  Wt 242 lb 9.6 oz (110.043 kg)  BMI 32.90 kg/m2 , BMI Body mass index is 32.9 kg/(m^2). GENERAL:  Well appearing  HEENT:  Pupils equal round and reactive, fundi not visualized, oral mucosa unremarkable NECK:  No jugular venous distention, waveform within normal limits, carotid upstroke brisk and symmetric, no bruits, no thyromegaly LYMPHATICS:  No cervical, inguinal adenopathy LUNGS:  Clear to auscultation bilaterally BACK:  No CVA tenderness CHEST:  Unremarkable HEART:  PMI not displaced or sustained,S1 and S2 within normal limits, no S3, no S4, no clicks, no rubs, no murmurs ABD:  Flat, positive bowel sounds normal in frequency in pitch, no bruits, no rebound, no guarding, no midline pulsatile mass, no hepatomegaly, no splenomegaly EXT:  2 plus pulses throughout, no edema, no cyanosis no clubbing SKIN:  No rashes no nodules NEURO:  Cranial nerves II through XII grossly intact, motor grossly intact throughout PSYCH:  Cognitively intact, oriented to  person place and time    EKG:  EKG is not ordered today. The ekg ordered 09/08/15 demonstrates sinus rhythm, rate 80, premature atrial contractions in a bigeminal pattern, left axis deviation, left anterior fascicular block   Recent Labs: 04/30/2015: ALT 32; BUN 22; Creatinine, Ser 1.22; Hemoglobin 15.5; Platelets 205.0; Potassium 4.6; Sodium 140; TSH 1.54    Lipid Panel    Component Value Date/Time   CHOL 147 04/30/2015 1025   TRIG 128.0 04/30/2015 1025   HDL 34.40* 04/30/2015 1025   CHOLHDL 4 04/30/2015 1025   VLDL 25.6 04/30/2015 1025   LDLCALC 87 04/30/2015 1025   LDLDIRECT 142.7 12/10/2011 1436      Wt Readings from Last 3 Encounters:  09/26/15 242 lb 9.6 oz (110.043 kg)  09/09/15 235 lb (106.595 kg)  09/08/15 238 lb (107.956 kg)      Other studies Reviewed: Additional studies/ records that were reviewed today include: Office records and old EKG. Review of the above records demonstrates:  Please see elsewhere in the note.     ASSESSMENT AND PLAN:  BRADYCARDIA:  I suspect that his device is undercounting his bigeminy.  I will apply a 24-hour Holter monitor. However, he's not having any symptoms so I doubt that further intervention would be needed.  CAD:  I would like to get the old records to see what kind of coronary disease he had. For now he will remain on the meds as listed. We will be contacting his physician from [place].  HTN:  Blood pressure fluctuates but overall seems to be well controlled. He will continue the meds as listed.   Current medicines are reviewed at length with the patient today.  The patient does not have concerns regarding medicines.  The following changes have been made:  no change  Labs/ tests ordered today include:   Orders Placed This Encounter  Procedures  . Holter monitor - 48 hour     Disposition:   FU with me as needed.      Signed, Minus Breeding, MD  09/26/2015 2:51 PM    [facility]

## 2015-09-30 ENCOUNTER — Ambulatory Visit (INDEPENDENT_AMBULATORY_CARE_PROVIDER_SITE_OTHER): Payer: Medicare Other

## 2015-09-30 DIAGNOSIS — R001 Bradycardia, unspecified: Secondary | ICD-10-CM

## 2015-10-01 ENCOUNTER — Telehealth: Payer: Self-pay | Admitting: Cardiology

## 2015-10-01 NOTE — Telephone Encounter (Signed)
Received records requested on 09/30/15 per Dr Percival Spanish from Dr Laban Emperor [facility] [place].  Records given to Dr Percival Spanish to review. lp

## 2015-10-15 DIAGNOSIS — D224 Melanocytic nevi of scalp and neck: Secondary | ICD-10-CM | POA: Diagnosis not present

## 2015-10-15 DIAGNOSIS — D1801 Hemangioma of skin and subcutaneous tissue: Secondary | ICD-10-CM | POA: Diagnosis not present

## 2015-10-15 DIAGNOSIS — Z85828 Personal history of other malignant neoplasm of skin: Secondary | ICD-10-CM | POA: Diagnosis not present

## 2015-10-15 DIAGNOSIS — L918 Other hypertrophic disorders of the skin: Secondary | ICD-10-CM | POA: Diagnosis not present

## 2015-10-15 DIAGNOSIS — L814 Other melanin hyperpigmentation: Secondary | ICD-10-CM | POA: Diagnosis not present

## 2015-10-15 DIAGNOSIS — Z419 Encounter for procedure for purposes other than remedying health state, unspecified: Secondary | ICD-10-CM | POA: Diagnosis not present

## 2015-10-15 DIAGNOSIS — L821 Other seborrheic keratosis: Secondary | ICD-10-CM | POA: Diagnosis not present

## 2015-10-15 DIAGNOSIS — Z85820 Personal history of malignant melanoma of skin: Secondary | ICD-10-CM | POA: Diagnosis not present

## 2015-10-15 DIAGNOSIS — L570 Actinic keratosis: Secondary | ICD-10-CM | POA: Diagnosis not present

## 2015-10-16 ENCOUNTER — Telehealth: Payer: Self-pay | Admitting: Cardiology

## 2015-10-16 NOTE — Telephone Encounter (Signed)
Review of chart shows this is still in process.

## 2015-10-16 NOTE — Telephone Encounter (Signed)
Called patient to let him know that monitor is in Dr Hochrein's basket to read and that we would call him back with results once Dr Percival Spanish reads his report.

## 2015-10-16 NOTE — Telephone Encounter (Signed)
Pt wants to know his Holter Monitor results.

## 2015-10-28 ENCOUNTER — Encounter: Payer: Self-pay | Admitting: Cardiology

## 2015-10-28 ENCOUNTER — Ambulatory Visit (INDEPENDENT_AMBULATORY_CARE_PROVIDER_SITE_OTHER): Payer: Medicare Other | Admitting: Internal Medicine

## 2015-10-28 ENCOUNTER — Encounter: Payer: Self-pay | Admitting: Internal Medicine

## 2015-10-28 ENCOUNTER — Telehealth: Payer: Self-pay

## 2015-10-28 VITALS — BP 120/74 | HR 76 | Ht 70.5 in | Wt 244.2 lb

## 2015-10-28 DIAGNOSIS — Z86010 Personal history of colonic polyps: Secondary | ICD-10-CM

## 2015-10-28 DIAGNOSIS — K5909 Other constipation: Secondary | ICD-10-CM | POA: Diagnosis not present

## 2015-10-28 DIAGNOSIS — I2510 Atherosclerotic heart disease of native coronary artery without angina pectoris: Secondary | ICD-10-CM | POA: Diagnosis not present

## 2015-10-28 MED ORDER — NA SULFATE-K SULFATE-MG SULF 17.5-3.13-1.6 GM/177ML PO SOLN: 1.0000 | Freq: Once | ORAL | Status: DC

## 2015-10-28 NOTE — Progress Notes (Signed)
HISTORY OF PRESENT ILLNESS:  Brent Taylor is a 75 y.o. male who sent by his primary care provider Dr. Burnice Taylor with chief complaint of change in bowel habits and the need for surveillance colonoscopy. Patient has a history of coronary artery disease diagnosed while living in [place]. He tells me that he had "50% lesion" for which no intervention occurred but Plavix prescribed. He has been on Plavix as a solo agent for approximately 8 or 9 years. Recently seen by heart care in [place]. I have reviewed outside GI records from [place]. The has undergone multiple colonoscopies. Most recently October 2004 and January 2008. On his most recent examination he was noted to have scattered diverticulosis. As well, multiple polyps removed which were all subcentimeter and found to be both hyperplastic and adenomatous. He is overdue for surveillance. In terms of bowel habits, he reports difficulties with constipation since December 2012. For this he takes an over-the-counter agent known as prune a lax. He denies rectal bleeding. No abdominal pain. He does have gas and bloating.  REVIEW OF SYSTEMS:  All non-GI ROS negative except for back pain, heart rhythm change  Past Medical History  Diagnosis Date  . Cancer (HCC)     hx - melanoma  . Depression     No need for therapy.  Improved  . Hyperlipidemia   . Hypertension   . UTI (lower urinary tract infection)   . Colon polyps   . Diverticula, colon   . Chicken pox   . Hemorrhoids     Past Surgical History  Procedure Laterality Date  . Melanoma excision      LEFT CHEST  . Melanoma excision      Social History Brent Taylor  reports that he has never smoked. He has never used smokeless tobacco. He reports that he does not drink alcohol or use illicit drugs.  family history includes Heart Problems in his mother.  Allergies  Allergen Reactions  . Lipitor [Atorvastatin Calcium]     Muscle ache       PHYSICAL EXAMINATION: Vital signs: BP  120/74 mmHg  Pulse 76  Ht 5' 10.5" (1.791 m)  Wt 244 lb 3.2 oz (110.768 kg)  BMI 34.53 kg/m2  Constitutional: generally well-appearing, no acute distress Psychiatric: alert and oriented x3, cooperative Eyes: extraocular movements intact, anicteric, conjunctiva pink Mouth: oral pharynx moist, no lesions Neck: supple without thyromegaly Lymph: no lymphadenopathy Cardiovascular: heart regular rate and rhythm, no murmur Lungs: clear to auscultation bilaterally Abdomen: soft, nontender, nondistended, no obvious ascites, no peritoneal signs, normal bowel sounds, no organomegaly Rectal: Deferred until colonoscopy Extremities: no clubbing cyanosis or lower extremity edema bilaterally Skin: no lesions on visible extremities Neuro: No focal deficits. DTRs intact. No asterixis.    ASSESSMENT:  #1. Chronic stable constipation #2. History of adenomatous colon polyps overdue for surveillance #3. History of coronary artery disease without intervention on chronic Plavix   PLAN:  #1. Continue over-the-counter agent for bowel regularity #2. Surveillance colonoscopy. Patient is high-risk given his coronary artery disease.The nature of the procedure, as well as the risks, benefits, and alternatives were carefully and thoroughly reviewed with the patient. Ample time for discussion and questions allowed. The patient understood, was satisfied, and agreed to proceed. #3. Would like to hold Plavix 1 week prior to his procedure. Would recommend aspirin bridge to begin several days before discontinuation of Plavix and followed several days after reinstituting Plavix. We will confer with his cardiologist to see if this is acceptable

## 2015-10-28 NOTE — Telephone Encounter (Signed)
I would like to see his [place] records before suggesting that he can come off of his only antiplatelet therapy.  We will request these records.

## 2015-10-28 NOTE — Patient Instructions (Signed)
You have been scheduled for a colonoscopy. Please follow written instructions given to you at your visit today.  Please pick up your prep supplies at the pharmacy within the next 1-3 days. If you use inhalers (even only as needed), please bring them with you on the day of your procedure.   

## 2015-10-28 NOTE — Telephone Encounter (Signed)
  10/28/2015   RE: Juwon Douthat DOB: 04-26-1941 MRN: [id number]   Dear Percival Spanish,    We have scheduled the above patient for an endoscopic procedure. Our records show that he is on anticoagulation therapy.   Please advise as to how long the patient may come off his therapy of Plavix prior to the procedure, which is scheduled for 11/10/2015.  Please fax back/ or route the completed form to [facility] at [phone].   Sincerely,    Phillis Haggis

## 2015-10-28 NOTE — Telephone Encounter (Signed)
Brent Taylor, [name] is currently scheduled for colonoscopy 11-10-15. Please call him today let him know that Dr. Percival Spanish needs to review his cardiology records from [place] in order to make an informed decision on his Plavix therapy.If we can't sort it out by 11-03-15, we will need to move his examination (which is not urgent) out. Keep tabs on this for me.Thanks. Dr. Henrene Pastor   Thanks [name]. [name]

## 2015-10-28 NOTE — Telephone Encounter (Signed)
I called patient back regarding his mychart inquiry. Advised on Dr. Rosezella Florida recommendations, pt encouraged to call for any concerns.  He voiced thanks and understanding.

## 2015-10-31 NOTE — Telephone Encounter (Signed)
Spoke with Brent Taylor and told him that Dr. Percival Spanish had replied that he wanted to review his cardiology records from [place] before making a decision about his Plavix.  If this is not resolved by 11/03/2015 we will reschedule the procedure, per Dr. Henrene Pastor.  I told patient I would let him know as soon as we had enough information to make a decision about his procedure.  Patient agreed.

## 2015-11-04 ENCOUNTER — Encounter: Payer: Self-pay | Admitting: Adult Health

## 2015-11-04 ENCOUNTER — Ambulatory Visit (INDEPENDENT_AMBULATORY_CARE_PROVIDER_SITE_OTHER): Payer: Medicare Other | Admitting: Adult Health

## 2015-11-04 VITALS — BP 120/74 | Temp 98.2°F | Ht 70.5 in | Wt 243.6 lb

## 2015-11-04 DIAGNOSIS — I2510 Atherosclerotic heart disease of native coronary artery without angina pectoris: Secondary | ICD-10-CM | POA: Diagnosis not present

## 2015-11-04 DIAGNOSIS — R6889 Other general symptoms and signs: Secondary | ICD-10-CM | POA: Diagnosis not present

## 2015-11-04 NOTE — Progress Notes (Signed)
Subjective:    Patient ID: Brent Taylor, male    DOB: 06-09-1941, 75 y.o.   MRN: [id number]  HPI  Three days ago he woke up and " felt weak". This weakness lasted all day.   Yesterday he continues to feel weak but had body aches. He took two ibuprofen and the body aches went away.   Woke up this morning feeling much better. About an hour ago he started to have terrible chills and body aches.  He does not feel as weak as when this started.   He denies fevers, diarrhea, constipation.  Has slight nausea  Review of Systems  Constitutional: Positive for chills, activity change and fatigue. Negative for fever, diaphoresis and unexpected weight change.  HENT: Positive for rhinorrhea. Negative for postnasal drip, sinus pressure and sore throat.   Respiratory: Negative.   Genitourinary: Negative.   Musculoskeletal: Positive for myalgias. Negative for back pain, joint swelling and gait problem.  Skin: Negative.   Neurological: Negative.   All other systems reviewed and are negative.  Past Medical History  Diagnosis Date  . Cancer (HCC)     hx - melanoma  . Depression     No need for therapy.  Improved  . Hyperlipidemia   . Hypertension   . UTI (lower urinary tract infection)   . Colon polyps   . Diverticula, colon   . Chicken pox   . Hemorrhoids     Social History   Social History  . Marital Status: Married    Spouse Name: N/A  . Number of Children: 2  . Years of Education: N/A   Occupational History  . Minister    Social History Main Topics  . Smoking status: Never Smoker   . Smokeless tobacco: Never Used  . Alcohol Use: No  . Drug Use: No  . Sexual Activity: Not on file   Other Topics Concern  . Not on file   Social History Narrative   Lives with wife.      Past Surgical History  Procedure Laterality Date  . Melanoma excision      LEFT CHEST  . Melanoma excision      Family History  Problem Relation Age of Onset  . Heart Problems Mother    Skipping    Allergies  Allergen Reactions  . Lipitor [Atorvastatin Calcium]     Muscle ache    Current Outpatient Prescriptions on File Prior to Visit  Medication Sig Dispense Refill  . clopidogrel (PLAVIX) 75 MG tablet TAKE ONE TABLET ('75MG'$  TOTAL) BY MOUTH DAILY 90 tablet 1  . esomeprazole (NEXIUM) 20 MG packet Take 20 mg by mouth daily before breakfast. OTC    . hydrochlorothiazide (HYDRODIURIL) 25 MG tablet TAKE 1 TABLET (25 MG TOTAL) BY MOUTH DAILY. 90 tablet 1  . ibuprofen (ADVIL,MOTRIN) 200 MG tablet Take 600 mg by mouth every 6 (six) hours as needed.    . metFORMIN (GLUCOPHAGE) 500 MG tablet TAKE ONE TABLET (50OMG TOTAL) BY MOUTH DAILY WITH BREAKFAST 30 tablet 5  . Na Sulfate-K Sulfate-Mg Sulf SOLN Take 1 kit by mouth once. 354 mL 0  . penicillin v potassium (VEETID) 500 MG tablet     . pravastatin (PRAVACHOL) 40 MG tablet TAKE 1 TABLET (40 MG TOTAL) BY MOUTH DAILY. 90 tablet 1  . quinapril (ACCUPRIL) 40 MG tablet TAKE ONE TABLET ('40MG'$  TOTAL)  BY MOUTH DAILY 90 tablet 1   No current facility-administered medications on file prior to visit.    BP  120/74 mmHg  Temp(Src) 98.2 F (36.8 C) (Oral)  Ht 5' 10.5" (1.791 m)  Wt 243 lb 9.6 oz (110.496 kg)  BMI 34.45 kg/m2       Objective:   Physical Exam  Constitutional: He is oriented to person, place, and time. He appears well-developed and well-nourished. No distress.  HENT:  Head: Normocephalic and atraumatic.  Right Ear: External ear normal.  Left Ear: External ear normal.  Nose: Nose normal.  Mouth/Throat: Oropharynx is clear and moist. No oropharyngeal exudate.  Tm's visualized. No cerumen impaction or acute infection.   Neck: Normal range of motion. Neck supple.  Cardiovascular: Normal rate, regular rhythm, normal heart sounds and intact distal pulses.  Exam reveals no gallop and no friction rub.   No murmur heard. + ectopic beat  Pulmonary/Chest: Effort normal and breath sounds normal. No respiratory distress. He  has no wheezes. He has no rales. He exhibits no tenderness.  Abdominal: Soft. Bowel sounds are normal. He exhibits no distension and no mass. There is no tenderness. There is no rebound and no guarding.  Lymphadenopathy:    He has no cervical adenopathy.  Neurological: He is alert and oriented to person, place, and time.  Skin: Skin is warm and dry. No rash noted. He is not diaphoretic. No erythema. No pallor.  Psychiatric: He has a normal mood and affect. His behavior is normal. Judgment and thought content normal.  Nursing note and vitals reviewed.     Assessment & Plan:  1. Flu-like symptoms - Rapid Flu - Negative - Likely viral syndrome. Rest and stay well hydrated.  - Follow up if no improvement in 2 days.

## 2015-11-04 NOTE — Telephone Encounter (Signed)
Spoke with Brent Taylor at [facility] who is going to see if Dr. Percival Spanish has reviewed the records and can make a decision regarding patient's Plavix

## 2015-11-04 NOTE — Progress Notes (Signed)
Pre visit review using our clinic review tool, if applicable. No additional management support is needed unless otherwise documented below in the visit note. 

## 2015-11-05 ENCOUNTER — Telehealth: Payer: Self-pay | Admitting: Cardiology

## 2015-11-05 LAB — POCT INFLUENZA A/B
Influenza A, POC: NEGATIVE
Influenza B, POC: NEGATIVE

## 2015-11-05 NOTE — Telephone Encounter (Signed)
Spoke with Neoma Laming at [facility] and told her I had rescheduled Mr. Herrman.  She was going to remind Dr. Warren Lacy to review patient's [place] records.

## 2015-11-05 NOTE — Addendum Note (Signed)
Addended by: Sandria Bales B on: 11/05/2015 08:38 AM   Modules accepted: Orders, SmartSet

## 2015-11-05 NOTE — Telephone Encounter (Signed)
Spoke with leslie, his procedure has been moved to 12-05-15. Dr Percival Spanish is to review the notes from outside and let them know about the plavix Will forward to dr hochrein

## 2015-11-05 NOTE — Telephone Encounter (Signed)
Spoke with patient and rescheduled his colonoscopy for further out in February.  I told him I would call him as soon as I heard from Dr. Percival Spanish and then mail him new instructions with the new date.  Patient agreed.

## 2015-11-05 NOTE — Telephone Encounter (Signed)
Spoke with [employer] who told me Brent Taylor is not there today.  She was going to send another message to Dr. Warren Lacy regarding patient's Plavix.  Called patient and left message that we would probably need to reschedule his colonoscopy since at this point he would not be able to be off his Plavix for 5 days.

## 2015-11-05 NOTE — Telephone Encounter (Signed)
Magda Paganini is calling to see when Brent Taylor can stop his Plavix before his procedure on  Monday ..please call  . Thanks.[name]

## 2015-11-06 NOTE — Telephone Encounter (Signed)
Returned call to [place] & notified of instruction, OK to come off Plavix for 5 days prior to procedure as they had requested. Obtained fax # to send clearance. Faxed this note to [phone].

## 2015-11-06 NOTE — Telephone Encounter (Signed)
Spoke with Ovid Curd at Dr. Rosezella [place] office who communicated his instructions - per Dr. Percival Spanish, patient can come off his Plavix for 5 days prior to his procedure.  Ovid Curd is faxing this information to our office.  Called patient and relayed this information.  Patient understood.

## 2015-11-06 NOTE — Telephone Encounter (Signed)
I reviewed his notes extensively from [facility]  I could not find evidence of CAD.  I did not find a cath.  He did have a negative stress test prior to moving.  I see no contraindication to coming off of Plavix as needed for the procedure.

## 2015-11-09 ENCOUNTER — Emergency Department (HOSPITAL_COMMUNITY): Payer: Medicare Other

## 2015-11-09 ENCOUNTER — Encounter (HOSPITAL_COMMUNITY): Payer: Self-pay

## 2015-11-09 ENCOUNTER — Inpatient Hospital Stay (HOSPITAL_COMMUNITY)
Admission: EM | Admit: 2015-11-09 | Discharge: 2015-11-16 | DRG: 872 | Disposition: A | Discharge status: Home / Self Care | Payer: Medicare Other | Attending: Internal Medicine | Admitting: Internal Medicine

## 2015-11-09 DIAGNOSIS — M542 Cervicalgia: Secondary | ICD-10-CM | POA: Diagnosis present

## 2015-11-09 DIAGNOSIS — Z6833 Body mass index (BMI) 33.0-33.9, adult: Secondary | ICD-10-CM

## 2015-11-09 DIAGNOSIS — R112 Nausea with vomiting, unspecified: Secondary | ICD-10-CM | POA: Diagnosis present

## 2015-11-09 DIAGNOSIS — I959 Hypotension, unspecified: Secondary | ICD-10-CM | POA: Diagnosis not present

## 2015-11-09 DIAGNOSIS — K047 Periapical abscess without sinus: Secondary | ICD-10-CM | POA: Diagnosis present

## 2015-11-09 DIAGNOSIS — Z8719 Personal history of other diseases of the digestive system: Secondary | ICD-10-CM

## 2015-11-09 DIAGNOSIS — Z888 Allergy status to other drugs, medicaments and biological substances status: Secondary | ICD-10-CM

## 2015-11-09 DIAGNOSIS — G43919 Migraine, unspecified, intractable, without status migrainosus: Secondary | ICD-10-CM | POA: Diagnosis present

## 2015-11-09 DIAGNOSIS — A419 Sepsis, unspecified organism: Principal | ICD-10-CM | POA: Diagnosis present

## 2015-11-09 DIAGNOSIS — E1165 Type 2 diabetes mellitus with hyperglycemia: Secondary | ICD-10-CM | POA: Diagnosis present

## 2015-11-09 DIAGNOSIS — I10 Essential (primary) hypertension: Secondary | ICD-10-CM | POA: Diagnosis present

## 2015-11-09 DIAGNOSIS — I5042 Chronic combined systolic (congestive) and diastolic (congestive) heart failure: Secondary | ICD-10-CM | POA: Diagnosis present

## 2015-11-09 DIAGNOSIS — G039 Meningitis, unspecified: Secondary | ICD-10-CM | POA: Diagnosis present

## 2015-11-09 DIAGNOSIS — K029 Dental caries, unspecified: Secondary | ICD-10-CM | POA: Diagnosis present

## 2015-11-09 DIAGNOSIS — L0291 Cutaneous abscess, unspecified: Secondary | ICD-10-CM | POA: Diagnosis present

## 2015-11-09 DIAGNOSIS — N183 Chronic kidney disease, stage 3 unspecified: Secondary | ICD-10-CM | POA: Diagnosis present

## 2015-11-09 DIAGNOSIS — E1122 Type 2 diabetes mellitus with diabetic chronic kidney disease: Secondary | ICD-10-CM | POA: Diagnosis not present

## 2015-11-09 DIAGNOSIS — E663 Overweight: Secondary | ICD-10-CM | POA: Diagnosis present

## 2015-11-09 DIAGNOSIS — I130 Hypertensive heart and chronic kidney disease with heart failure and stage 1 through stage 4 chronic kidney disease, or unspecified chronic kidney disease: Secondary | ICD-10-CM | POA: Diagnosis not present

## 2015-11-09 DIAGNOSIS — E872 Acidosis, unspecified: Secondary | ICD-10-CM | POA: Diagnosis present

## 2015-11-09 DIAGNOSIS — N179 Acute kidney failure, unspecified: Secondary | ICD-10-CM | POA: Diagnosis present

## 2015-11-09 DIAGNOSIS — R531 Weakness: Secondary | ICD-10-CM | POA: Diagnosis not present

## 2015-11-09 DIAGNOSIS — E860 Dehydration: Secondary | ICD-10-CM | POA: Diagnosis present

## 2015-11-09 DIAGNOSIS — J9811 Atelectasis: Secondary | ICD-10-CM | POA: Diagnosis not present

## 2015-11-09 DIAGNOSIS — Z7902 Long term (current) use of antithrombotics/antiplatelets: Secondary | ICD-10-CM

## 2015-11-09 DIAGNOSIS — R509 Fever, unspecified: Secondary | ICD-10-CM | POA: Insufficient documentation

## 2015-11-09 DIAGNOSIS — R55 Syncope and collapse: Secondary | ICD-10-CM | POA: Diagnosis not present

## 2015-11-09 DIAGNOSIS — Z85820 Personal history of malignant melanoma of skin: Secondary | ICD-10-CM

## 2015-11-09 DIAGNOSIS — Z7984 Long term (current) use of oral hypoglycemic drugs: Secondary | ICD-10-CM

## 2015-11-09 DIAGNOSIS — N189 Chronic kidney disease, unspecified: Secondary | ICD-10-CM

## 2015-11-09 DIAGNOSIS — E785 Hyperlipidemia, unspecified: Secondary | ICD-10-CM | POA: Diagnosis present

## 2015-11-09 LAB — I-STAT CHEM 8, ED
BUN: 30 mg/dL — AB (ref 6–20)
CALCIUM ION: 1.14 mmol/L (ref 1.13–1.30)
CREATININE: 1.6 mg/dL — AB (ref 0.61–1.24)
Chloride: 100 mmol/L — ABNORMAL LOW (ref 101–111)
GLUCOSE: 149 mg/dL — AB (ref 65–99)
HEMATOCRIT: 52 % (ref 39.0–52.0)
HEMOGLOBIN: 17.7 g/dL — AB (ref 13.0–17.0)
Potassium: 3.4 mmol/L — ABNORMAL LOW (ref 3.5–5.1)
Sodium: 141 mmol/L (ref 135–145)
TCO2: 24 mmol/L (ref 0–100)

## 2015-11-09 LAB — URINALYSIS, ROUTINE W REFLEX MICROSCOPIC
Glucose, UA: NEGATIVE mg/dL
Hgb urine dipstick: NEGATIVE
Ketones, ur: NEGATIVE mg/dL
Leukocytes, UA: NEGATIVE
NITRITE: NEGATIVE
PH: 5 (ref 5.0–8.0)
Protein, ur: NEGATIVE mg/dL
SPECIFIC GRAVITY, URINE: 1.02 (ref 1.005–1.030)

## 2015-11-09 LAB — I-STAT CG4 LACTIC ACID, ED
LACTIC ACID, VENOUS: 4.57 mmol/L — AB (ref 0.5–2.0)
Lactic Acid, Venous: 2.93 mmol/L (ref 0.5–2.0)

## 2015-11-09 LAB — CBC
HEMATOCRIT: 50.4 % (ref 39.0–52.0)
HEMOGLOBIN: 16.6 g/dL (ref 13.0–17.0)
MCH: 29 pg (ref 26.0–34.0)
MCHC: 32.9 g/dL (ref 30.0–36.0)
MCV: 88.1 fL (ref 78.0–100.0)
Platelets: 193 10*3/uL (ref 150–400)
RBC: 5.72 MIL/uL (ref 4.22–5.81)
RDW: 14.9 % (ref 11.5–15.5)
WBC: 7.5 10*3/uL (ref 4.0–10.5)

## 2015-11-09 LAB — BASIC METABOLIC PANEL
ANION GAP: 14 (ref 5–15)
BUN: 27 mg/dL — ABNORMAL HIGH (ref 6–20)
CALCIUM: 9.8 mg/dL (ref 8.9–10.3)
CO2: 26 mmol/L (ref 22–32)
Chloride: 101 mmol/L (ref 101–111)
Creatinine, Ser: 1.73 mg/dL — ABNORMAL HIGH (ref 0.61–1.24)
GFR calc Af Amer: 42 mL/min — ABNORMAL LOW (ref >60)
GFR, EST NON AFRICAN AMERICAN: 37 mL/min — AB (ref >60)
GLUCOSE: 151 mg/dL — AB (ref 65–99)
POTASSIUM: 3.5 mmol/L (ref 3.5–5.1)
Sodium: 141 mmol/L (ref 135–145)

## 2015-11-09 LAB — CBG MONITORING, ED: GLUCOSE-CAPILLARY: 131 mg/dL — AB (ref 65–99)

## 2015-11-09 LAB — I-STAT TROPONIN, ED: TROPONIN I, POC: 0.03 ng/mL (ref 0.00–0.08)

## 2015-11-09 LAB — T4, FREE: FREE T4: 1.06 ng/dL (ref 0.61–1.12)

## 2015-11-09 LAB — BRAIN NATRIURETIC PEPTIDE: B Natriuretic Peptide: 33.7 pg/mL (ref 0.0–100.0)

## 2015-11-09 LAB — CK: Total CK: 40 U/L — ABNORMAL LOW (ref 49–397)

## 2015-11-09 MED ORDER — VANCOMYCIN HCL 10 G IV SOLR
1500.0000 mg | Freq: Once | INTRAVENOUS | Status: AC
  Administered 2015-11-09: 1500 mg via INTRAVENOUS
  Filled 2015-11-09: qty 1500

## 2015-11-09 MED ORDER — SODIUM CHLORIDE 0.9 % IV BOLUS (SEPSIS)
1000.0000 mL | Freq: Once | INTRAVENOUS | Status: AC
  Administered 2015-11-09: 1000 mL via INTRAVENOUS

## 2015-11-09 MED ORDER — PIPERACILLIN-TAZOBACTAM 3.375 G IVPB 30 MIN
3.3750 g | Freq: Once | INTRAVENOUS | Status: AC
  Administered 2015-11-09: 3.375 g via INTRAVENOUS
  Filled 2015-11-09: qty 50

## 2015-11-09 NOTE — ED Notes (Signed)
As soon as pt got to triage he expresses need to have BM.  This nurse and tech took pt to [place], had large BM.  Pt reports onset 1 week "feeling bad", c/o dizziness,  intermittant aches and chills.  Pt got chills last week went to PCP, no diagnosis.  Tonight around 7pm pt felt like he was going to pass out.

## 2015-11-10 ENCOUNTER — Encounter (HOSPITAL_COMMUNITY): Payer: Self-pay | Admitting: Internal Medicine

## 2015-11-10 ENCOUNTER — Encounter: Payer: Medicare Other | Admitting: Internal Medicine

## 2015-11-10 ENCOUNTER — Inpatient Hospital Stay (HOSPITAL_COMMUNITY): Payer: Medicare Other

## 2015-11-10 ENCOUNTER — Telehealth: Payer: Self-pay | Admitting: Internal Medicine

## 2015-11-10 ENCOUNTER — Ambulatory Visit: Payer: Self-pay | Admitting: Internal Medicine

## 2015-11-10 DIAGNOSIS — A419 Sepsis, unspecified organism: Secondary | ICD-10-CM | POA: Diagnosis present

## 2015-11-10 DIAGNOSIS — E860 Dehydration: Secondary | ICD-10-CM | POA: Diagnosis present

## 2015-11-10 DIAGNOSIS — N179 Acute kidney failure, unspecified: Secondary | ICD-10-CM | POA: Diagnosis not present

## 2015-11-10 DIAGNOSIS — G43919 Migraine, unspecified, intractable, without status migrainosus: Secondary | ICD-10-CM | POA: Diagnosis present

## 2015-11-10 DIAGNOSIS — R509 Fever, unspecified: Secondary | ICD-10-CM | POA: Diagnosis not present

## 2015-11-10 DIAGNOSIS — E1165 Type 2 diabetes mellitus with hyperglycemia: Secondary | ICD-10-CM | POA: Diagnosis present

## 2015-11-10 DIAGNOSIS — L0291 Cutaneous abscess, unspecified: Secondary | ICD-10-CM | POA: Diagnosis not present

## 2015-11-10 DIAGNOSIS — Z888 Allergy status to other drugs, medicaments and biological substances status: Secondary | ICD-10-CM | POA: Diagnosis not present

## 2015-11-10 DIAGNOSIS — R112 Nausea with vomiting, unspecified: Secondary | ICD-10-CM | POA: Diagnosis not present

## 2015-11-10 DIAGNOSIS — I959 Hypotension, unspecified: Secondary | ICD-10-CM | POA: Diagnosis not present

## 2015-11-10 DIAGNOSIS — M542 Cervicalgia: Secondary | ICD-10-CM | POA: Diagnosis not present

## 2015-11-10 DIAGNOSIS — Z6833 Body mass index (BMI) 33.0-33.9, adult: Secondary | ICD-10-CM | POA: Diagnosis not present

## 2015-11-10 DIAGNOSIS — D329 Benign neoplasm of meninges, unspecified: Secondary | ICD-10-CM | POA: Diagnosis not present

## 2015-11-10 DIAGNOSIS — E785 Hyperlipidemia, unspecified: Secondary | ICD-10-CM | POA: Diagnosis present

## 2015-11-10 DIAGNOSIS — N183 Chronic kidney disease, stage 3 unspecified: Secondary | ICD-10-CM | POA: Diagnosis present

## 2015-11-10 DIAGNOSIS — R531 Weakness: Secondary | ICD-10-CM | POA: Diagnosis present

## 2015-11-10 DIAGNOSIS — I5041 Acute combined systolic (congestive) and diastolic (congestive) heart failure: Secondary | ICD-10-CM | POA: Diagnosis not present

## 2015-11-10 DIAGNOSIS — I10 Essential (primary) hypertension: Secondary | ICD-10-CM | POA: Diagnosis not present

## 2015-11-10 DIAGNOSIS — E663 Overweight: Secondary | ICD-10-CM | POA: Diagnosis present

## 2015-11-10 DIAGNOSIS — K047 Periapical abscess without sinus: Secondary | ICD-10-CM | POA: Diagnosis not present

## 2015-11-10 DIAGNOSIS — E872 Acidosis, unspecified: Secondary | ICD-10-CM | POA: Diagnosis present

## 2015-11-10 DIAGNOSIS — M4802 Spinal stenosis, cervical region: Secondary | ICD-10-CM | POA: Diagnosis not present

## 2015-11-10 DIAGNOSIS — I5042 Chronic combined systolic (congestive) and diastolic (congestive) heart failure: Secondary | ICD-10-CM | POA: Diagnosis present

## 2015-11-10 DIAGNOSIS — E1122 Type 2 diabetes mellitus with diabetic chronic kidney disease: Secondary | ICD-10-CM | POA: Diagnosis present

## 2015-11-10 DIAGNOSIS — Z7984 Long term (current) use of oral hypoglycemic drugs: Secondary | ICD-10-CM | POA: Diagnosis not present

## 2015-11-10 DIAGNOSIS — K029 Dental caries, unspecified: Secondary | ICD-10-CM | POA: Diagnosis present

## 2015-11-10 DIAGNOSIS — Z85820 Personal history of malignant melanoma of skin: Secondary | ICD-10-CM | POA: Diagnosis not present

## 2015-11-10 DIAGNOSIS — I130 Hypertensive heart and chronic kidney disease with heart failure and stage 1 through stage 4 chronic kidney disease, or unspecified chronic kidney disease: Secondary | ICD-10-CM | POA: Diagnosis present

## 2015-11-10 DIAGNOSIS — Z7902 Long term (current) use of antithrombotics/antiplatelets: Secondary | ICD-10-CM | POA: Diagnosis not present

## 2015-11-10 DIAGNOSIS — B999 Unspecified infectious disease: Secondary | ICD-10-CM | POA: Diagnosis not present

## 2015-11-10 LAB — COMPREHENSIVE METABOLIC PANEL
ALK PHOS: 36 U/L — AB (ref 38–126)
ALT: 44 U/L (ref 17–63)
AST: 47 U/L — AB (ref 15–41)
Albumin: 2.8 g/dL — ABNORMAL LOW (ref 3.5–5.0)
Anion gap: 10 (ref 5–15)
BILIRUBIN TOTAL: 0.7 mg/dL (ref 0.3–1.2)
BUN: 26 mg/dL — AB (ref 6–20)
CALCIUM: 7.9 mg/dL — AB (ref 8.9–10.3)
CO2: 22 mmol/L (ref 22–32)
CREATININE: 1.73 mg/dL — AB (ref 0.61–1.24)
Chloride: 108 mmol/L (ref 101–111)
GFR, EST AFRICAN AMERICAN: 42 mL/min — AB (ref >60)
GFR, EST NON AFRICAN AMERICAN: 37 mL/min — AB (ref >60)
Glucose, Bld: 160 mg/dL — ABNORMAL HIGH (ref 65–99)
Potassium: 3.7 mmol/L (ref 3.5–5.1)
Sodium: 140 mmol/L (ref 135–145)
TOTAL PROTEIN: 5.2 g/dL — AB (ref 6.5–8.1)

## 2015-11-10 LAB — PROTIME-INR
INR: 1.26 (ref 0.00–1.49)
Prothrombin Time: 15.9 seconds — ABNORMAL HIGH (ref 11.6–15.2)

## 2015-11-10 LAB — CBC WITH DIFFERENTIAL/PLATELET
Basophils Absolute: 0 10*3/uL (ref 0.0–0.1)
Basophils Relative: 0 %
EOS PCT: 1 %
Eosinophils Absolute: 0.1 10*3/uL (ref 0.0–0.7)
HEMATOCRIT: 41.1 % (ref 39.0–52.0)
HEMOGLOBIN: 13.3 g/dL (ref 13.0–17.0)
LYMPHS ABS: 0.2 10*3/uL — AB (ref 0.7–4.0)
LYMPHS PCT: 2 %
MCH: 28.5 pg (ref 26.0–34.0)
MCHC: 32.4 g/dL (ref 30.0–36.0)
MCV: 88.2 fL (ref 78.0–100.0)
Monocytes Absolute: 0.4 10*3/uL (ref 0.1–1.0)
Monocytes Relative: 3 %
NEUTROS ABS: 12.1 10*3/uL — AB (ref 1.7–7.7)
Neutrophils Relative %: 95 %
PLATELETS: 164 10*3/uL (ref 150–400)
RBC: 4.66 MIL/uL (ref 4.22–5.81)
RDW: 15.2 % (ref 11.5–15.5)
WBC: 12.8 10*3/uL — AB (ref 4.0–10.5)

## 2015-11-10 LAB — APTT: aPTT: 26 seconds (ref 24–37)

## 2015-11-10 LAB — PROCALCITONIN
PROCALCITONIN: 21.31 ng/mL
Procalcitonin: 12.91 ng/mL

## 2015-11-10 LAB — INFLUENZA PANEL BY PCR (TYPE A & B)
H1N1FLUPCR: NOT DETECTED
INFLAPCR: NEGATIVE
INFLBPCR: NEGATIVE

## 2015-11-10 LAB — CORTISOL: Cortisol, Plasma: 17.8 ug/dL

## 2015-11-10 LAB — I-STAT TROPONIN, ED: Troponin i, poc: 0.02 ng/mL (ref 0.00–0.08)

## 2015-11-10 LAB — TSH: TSH: 2.78 u[IU]/mL (ref 0.350–4.500)

## 2015-11-10 LAB — LACTIC ACID, PLASMA
LACTIC ACID, VENOUS: 3.3 mmol/L — AB (ref 0.5–2.0)
LACTIC ACID, VENOUS: 1.6 mmol/L (ref 0.5–2.0)
LACTIC ACID, VENOUS: 1.6 mmol/L (ref 0.5–2.0)

## 2015-11-10 LAB — GLUCOSE, CAPILLARY
GLUCOSE-CAPILLARY: 130 mg/dL — AB (ref 65–99)
GLUCOSE-CAPILLARY: 108 mg/dL — AB (ref 65–99)
Glucose-Capillary: 177 mg/dL — ABNORMAL HIGH (ref 65–99)
Glucose-Capillary: 96 mg/dL (ref 65–99)

## 2015-11-10 LAB — I-STAT CG4 LACTIC ACID, ED: Lactic Acid, Venous: 3.87 mmol/L (ref 0.5–2.0)

## 2015-11-10 LAB — TROPONIN I: Troponin I: <0.03 ng/mL (ref <0.031)

## 2015-11-10 LAB — MRSA PCR SCREENING: MRSA by PCR: NEGATIVE

## 2015-11-10 MED ORDER — PIPERACILLIN-TAZOBACTAM 3.375 G IVPB 30 MIN
3.3750 g | Freq: Once | INTRAVENOUS | Status: DC
  Filled 2015-11-10: qty 50

## 2015-11-10 MED ORDER — SODIUM CHLORIDE 0.9 % IV SOLN
INTRAVENOUS | Status: DC
  Administered 2015-11-10 (×2): via INTRAVENOUS

## 2015-11-10 MED ORDER — OXYCODONE HCL 5 MG PO TABS
5.0000 mg | ORAL_TABLET | ORAL | Status: DC | PRN
  Administered 2015-11-10 – 2015-11-11 (×4): 10 mg via ORAL
  Filled 2015-11-10 (×4): qty 2

## 2015-11-10 MED ORDER — SODIUM CHLORIDE 0.9 % IV BOLUS (SEPSIS)
1000.0000 mL | Freq: Once | INTRAVENOUS | Status: AC
  Administered 2015-11-10: 1000 mL via INTRAVENOUS

## 2015-11-10 MED ORDER — ACETAMINOPHEN 650 MG RE SUPP: 650.0000 mg | Freq: Four times a day (QID) | RECTAL | Status: DC | PRN

## 2015-11-10 MED ORDER — TRAMADOL HCL 50 MG PO TABS
50.0000 mg | ORAL_TABLET | Freq: Four times a day (QID) | ORAL | Status: DC | PRN
  Administered 2015-11-10 – 2015-11-12 (×3): 50 mg via ORAL
  Filled 2015-11-10 (×3): qty 1

## 2015-11-10 MED ORDER — SODIUM CHLORIDE 0.9 % IV BOLUS (SEPSIS)
250.0000 mL | Freq: Once | INTRAVENOUS | Status: AC
  Administered 2015-11-10: 250 mL via INTRAVENOUS

## 2015-11-10 MED ORDER — SODIUM CHLORIDE 0.9 % IV SOLN
INTRAVENOUS | Status: DC
  Administered 2015-11-10 – 2015-11-11 (×2): via INTRAVENOUS

## 2015-11-10 MED ORDER — CLOPIDOGREL BISULFATE 75 MG PO TABS
75.0000 mg | ORAL_TABLET | Freq: Every day | ORAL | Status: DC
  Administered 2015-11-10 – 2015-11-16 (×7): 75 mg via ORAL
  Filled 2015-11-10 (×7): qty 1

## 2015-11-10 MED ORDER — ENOXAPARIN SODIUM 40 MG/0.4ML ~~LOC~~ SOLN
40.0000 mg | SUBCUTANEOUS | Status: DC
  Administered 2015-11-10 – 2015-11-16 (×7): 40 mg via SUBCUTANEOUS
  Filled 2015-11-10 (×7): qty 0.4

## 2015-11-10 MED ORDER — VANCOMYCIN HCL IN DEXTROSE 1-5 GM/200ML-% IV SOLN
1000.0000 mg | Freq: Once | INTRAVENOUS | Status: DC
Start: 2015-11-10 — End: 2015-11-10
  Filled 2015-11-10: qty 200

## 2015-11-10 MED ORDER — ACETAMINOPHEN 325 MG PO TABS
650.0000 mg | ORAL_TABLET | Freq: Four times a day (QID) | ORAL | Status: DC | PRN
  Administered 2015-11-10 – 2015-11-12 (×6): 650 mg via ORAL
  Filled 2015-11-10 (×6): qty 2

## 2015-11-10 MED ORDER — VANCOMYCIN HCL 10 G IV SOLR
1250.0000 mg | INTRAVENOUS | Status: DC
  Administered 2015-11-10 – 2015-11-11 (×2): 1250 mg via INTRAVENOUS
  Filled 2015-11-10 (×3): qty 1250

## 2015-11-10 MED ORDER — PIPERACILLIN-TAZOBACTAM 3.375 G IVPB
3.3750 g | Freq: Three times a day (TID) | INTRAVENOUS | Status: DC
  Administered 2015-11-10 – 2015-11-11 (×5): 3.375 g via INTRAVENOUS
  Filled 2015-11-10 (×7): qty 50

## 2015-11-10 MED ORDER — PRAVASTATIN SODIUM 40 MG PO TABS
40.0000 mg | ORAL_TABLET | Freq: Every day | ORAL | Status: DC
  Administered 2015-11-10 – 2015-11-11 (×2): 40 mg via ORAL
  Filled 2015-11-10 (×2): qty 1

## 2015-11-10 MED ORDER — ONDANSETRON HCL 4 MG PO TABS: 4.0000 mg | ORAL_TABLET | Freq: Four times a day (QID) | ORAL | Status: DC | PRN

## 2015-11-10 MED ORDER — ONDANSETRON HCL 4 MG/2ML IJ SOLN
4.0000 mg | Freq: Four times a day (QID) | INTRAMUSCULAR | Status: DC | PRN
  Administered 2015-11-11 (×2): 4 mg via INTRAVENOUS
  Filled 2015-11-10 (×3): qty 2

## 2015-11-10 MED ORDER — SODIUM CHLORIDE 0.9 % IV BOLUS (SEPSIS)
500.0000 mL | Freq: Once | INTRAVENOUS | Status: AC
Start: 2015-11-10 — End: 2015-11-10
  Administered 2015-11-10: 500 mL via INTRAVENOUS

## 2015-11-10 MED ORDER — ESOMEPRAZOLE MAGNESIUM 20 MG PO PACK: 20.0000 mg | PACK | Freq: Every day | ORAL | Status: DC

## 2015-11-10 MED ORDER — PANTOPRAZOLE SODIUM 40 MG PO TBEC
40.0000 mg | DELAYED_RELEASE_TABLET | Freq: Every day | ORAL | Status: DC
  Administered 2015-11-10 – 2015-11-16 (×6): 40 mg via ORAL
  Filled 2015-11-10 (×4): qty 1

## 2015-11-10 MED ORDER — INSULIN ASPART 100 UNIT/ML ~~LOC~~ SOLN
0.0000 [IU] | Freq: Three times a day (TID) | SUBCUTANEOUS | Status: DC
  Administered 2015-11-10: 1 [IU] via SUBCUTANEOUS
  Administered 2015-11-10: 2 [IU] via SUBCUTANEOUS
  Administered 2015-11-12: 1 [IU] via SUBCUTANEOUS
  Administered 2015-11-12: 2 [IU] via SUBCUTANEOUS
  Administered 2015-11-12: 1 [IU] via SUBCUTANEOUS

## 2015-11-10 NOTE — ED Provider Notes (Signed)
CSN: [id number]     Arrival date & time 11/09/15  2142 History   First MD Initiated Contact with Patient 11/09/15 2205     Chief Complaint  Patient presents with  . Near Syncope  . Dizziness     (Consider location/radiation/quality/duration/timing/severity/associated sxs/prior Treatment) Patient is a 75 y.o. male presenting with near-syncope and dizziness.  Near Syncope Associated symptoms include myalgias. Pertinent negatives include no abdominal pain, chest pain, chills, coughing, fever, headaches, nausea, rash or vomiting.  Dizziness Associated symptoms: no chest pain, no diarrhea, no headaches, no nausea, no shortness of breath and no vomiting     63 y m w remote h/o melanoma, no other sig PMH who comes in with one week malaise, muscle aches, and intermittent lightheadedness, no true syncope, no fevers.  No other sx.  He has noticed no blood in his stools or black stools, no abdominal pain, no cough/sob.  Past Medical History  Diagnosis Date  . Cancer (HCC)     hx - melanoma  . Depression     No need for therapy.  Improved  . Hyperlipidemia   . Hypertension   . UTI (lower urinary tract infection)   . Colon polyps   . Diverticula, colon   . Chicken pox   . Hemorrhoids    Past Surgical History  Procedure Laterality Date  . Melanoma excision      LEFT CHEST  . Melanoma excision     Family History  Problem Relation Age of Onset  . Heart Problems Mother     Skipping   Social History  Substance Use Topics  . Smoking status: Never Smoker   . Smokeless tobacco: Never Used  . Alcohol Use: No    Review of Systems  Constitutional: Negative for fever and chills.  Eyes: Negative for redness.  Respiratory: Negative for cough and shortness of breath.   Cardiovascular: Positive for near-syncope. Negative for chest pain.  Gastrointestinal: Negative for nausea, vomiting, abdominal pain and diarrhea.  Genitourinary: Negative for dysuria.  Musculoskeletal: Positive for  myalgias.  Skin: Negative for rash.  Neurological: Positive for dizziness and light-headedness. Negative for headaches.  All other systems reviewed and are negative.     Allergies  Lipitor  Home Medications   Prior to Admission medications   Medication Sig Start Date End Date Taking? Authorizing Provider  clopidogrel (PLAVIX) 75 MG tablet TAKE ONE TABLET (75MG TOTAL) BY MOUTH DAILY 06/06/15  Yes Marletta Lor, MD  esomeprazole (NEXIUM) 20 MG packet Take 20 mg by mouth daily before breakfast. OTC   Yes Historical Provider, MD  hydrochlorothiazide (HYDRODIURIL) 25 MG tablet TAKE 1 TABLET (25 MG TOTAL) BY MOUTH DAILY. 03/12/15  Yes Marletta Lor, MD  ibuprofen (ADVIL,MOTRIN) 200 MG tablet Take 600 mg by mouth every 6 (six) hours as needed.   Yes Historical Provider, MD  metFORMIN (GLUCOPHAGE) 500 MG tablet TAKE ONE TABLET (50OMG TOTAL) BY MOUTH DAILY WITH BREAKFAST 09/01/15  Yes Marletta Lor, MD  pravastatin (PRAVACHOL) 40 MG tablet TAKE 1 TABLET (40 MG TOTAL) BY MOUTH DAILY. 06/06/15  Yes Marletta Lor, MD  quinapril (ACCUPRIL) 40 MG tablet TAKE ONE TABLET (40MG TOTAL)  BY MOUTH DAILY 08/25/15  Yes Marletta Lor, MD  Na Sulfate-K Sulfate-Mg Sulf SOLN Take 1 kit by mouth once. Patient not taking: Reported on 11/09/2015 10/28/15   Irene Shipper, MD   BP 98/71 mmHg  Pulse 80  Temp(Src) 100.2 F (37.9 C) (Rectal)  Resp 20  Ht 6' (1.829 m)  Wt 108.863 kg  BMI 32.54 kg/m2  SpO2 98% Physical Exam  Constitutional: He is oriented to person, place, and time. No distress.  HENT:  Head: Normocephalic and atraumatic.  Eyes: EOM are normal. Pupils are equal, round, and reactive to light.  Neck: Normal range of motion. Neck supple.  Cardiovascular: Normal rate and normal heart sounds.   Pulmonary/Chest: Effort normal. No respiratory distress. He has no wheezes. He has no rales.  Abdominal: Soft. There is no tenderness. There is no rebound and no guarding.   Musculoskeletal: Normal range of motion.  Neurological: He is alert and oriented to person, place, and time.  Skin: No rash noted. He is not diaphoretic.  Psychiatric: He has a normal mood and affect.    ED Course  Procedures (including critical care time) Labs Review Labs Reviewed  BASIC METABOLIC PANEL - Abnormal; Notable for the following:    Glucose, Bld 151 (*)    BUN 27 (*)    Creatinine, Ser 1.73 (*)    GFR calc non Af Amer 37 (*)    GFR calc Af Amer 42 (*)    All other components within normal limits  URINALYSIS, ROUTINE W REFLEX MICROSCOPIC (NOT AT [facility]) - Abnormal; Notable for the following:    Color, Urine AMBER (*)    APPearance CLOUDY (*)    Bilirubin Urine SMALL (*)    All other components within normal limits  CK - Abnormal; Notable for the following:    Total CK 40 (*)    All other components within normal limits  CBG MONITORING, ED - Abnormal; Notable for the following:    Glucose-Capillary 131 (*)    All other components within normal limits  I-STAT CG4 LACTIC ACID, ED - Abnormal; Notable for the following:    Lactic Acid, Venous 4.57 (*)    All other components within normal limits  I-STAT CHEM 8, ED - Abnormal; Notable for the following:    Potassium 3.4 (*)    Chloride 100 (*)    BUN 30 (*)    Creatinine, Ser 1.60 (*)    Glucose, Bld 149 (*)    Hemoglobin 17.7 (*)    All other components within normal limits  I-STAT CG4 LACTIC ACID, ED - Abnormal; Notable for the following:    Lactic Acid, Venous 2.93 (*)    All other components within normal limits  CULTURE, BLOOD (ROUTINE X 2)  CULTURE, BLOOD (ROUTINE X 2)  URINE CULTURE  CBC  BRAIN NATRIURETIC PEPTIDE  TSH  T4, FREE  INFLUENZA PANEL BY PCR (TYPE A & B, H1N1)  PROCALCITONIN  I-STAT TROPOININ, ED  I-STAT TROPOININ, ED  I-STAT CG4 LACTIC ACID, ED    Imaging Review Dg Chest Portable 1 View  11/09/2015  CLINICAL DATA:  Initial evaluation for acute hypertension. EXAM: PORTABLE CHEST 1 VIEW  COMPARISON:  None. FINDINGS: Allowing for technique, cardiac and mediastinal silhouettes are within normal limits. Probable hiatal hernia. Lungs are hypoinflated. Linear opacity left lung base most consistent with atelectasis. No other focal airspace disease. No pulmonary edema. No definite pleural effusion. No pneumothorax. No acute osseus abnormality. Surgical clips overlie the left axilla. IMPRESSION: 1. Shallow lung inflation with mild left basilar atelectasis. No other active cardiopulmonary disease. 2. Probable hiatal hernia. Electronically Signed   By: Jeannine Boga M.D.   On: 11/09/2015 23:31   I have personally reviewed and evaluated these images and lab results as part of my medical decision-making.  EKG Interpretation   Date/Time:  Sunday November 09 2015 22:03:26 EST Ventricular Rate:  106 PR Interval:  156 QRS Duration: 100 QT Interval:  346 QTC Calculation: 459 R Axis:   -52 Text Interpretation:  Sinus tachycardia with Premature atrial complexes  Incomplete right bundle branch block Left anterior fascicular block Left  ventricular hypertrophy Abnormal QRS-T angle, consider primary T wave  abnormality Abnormal ECG Confirmed by ZAVITZ  MD, JOSHUA ([id number]) on  11/09/2015 10:12:52 PM      MDM   Final diagnoses:  Hypotension, unspecified hypotension type  Lactic acidosis  AKI (acute kidney injury) ([place])    MDM: 26 y m w remote h/o melanoma, no other sig PMH who comes in with one week malaise, muscle aches, and intermittent lightheadedness,  On exam, hypotensive in the 70's, borderline temp. Vitals otherwise stable. ekg w/o ischemic changes  Ddx: Dehydration:  He reports no n/v/d/decreased PO Sepsis: no infectious sx but will obtain bcx, ucx, cxr and cover w/ vanc/zosyn.  Will obtain cbc/lactic Will obtain influenza PE:  Doubt given no chest pain/sob/hypoxia/leg swelling/prev dvt/pe Anemia:  No reports of blood loss, will check cbc Hypothyroid:  Will check  tsh Adrenal insufficiency: doubt as pt not on steroids.  Will check bmp Heart failure:  No hx, will check cxr and bnp.  Will obtain trop AAA rupture:  Normal caliber aorta and no abdominal pain.  Will give 3L NS and cover w/ vanc/zosyn.  Cbc/bmp only sig for mild aki.  cxr wnl.  ua wnl.  Trop and bnp wnl.  Tsh/free t4 wnl. Feel stable for step down, likely dehydration vs. Sepsis   bp improved to 90's w/ fluids.  Pt feeling better.    Jarome Matin, MD 11/10/15 5207  Jarome Matin, MD 11/10/15 6191  Elnora Morrison, MD 11/15/15 [phone]

## 2015-11-10 NOTE — Progress Notes (Signed)
ANTIBIOTIC CONSULT NOTE - INITIAL  Pharmacy Consult for Vancocin and Zosyn Indication: rule out sepsis  Allergies  Allergen Reactions  . Lipitor [Atorvastatin Calcium]     Muscle ache    Patient Measurements: Height: 6' (182.9 cm) Weight: 246 lb 12.8 oz (111.948 kg) IBW/kg (Calculated) : 77.6  Vital Signs: Temp: 98 F (36.7 C) (01/30 0142) Temp Source: Oral (01/30 0142) BP: 98/71 mmHg (01/30 0115) Pulse Rate: 92 (01/30 0142)  Labs:  Recent Labs  11/09/15 2215 11/09/15 2236  WBC 7.5  --   HGB 16.6 17.7*  PLT 193  --   CREATININE 1.73* 1.60*   Estimated Creatinine Clearance: 50.7 mL/min (by C-G formula based on Cr of 1.6).  Medical History: Past Medical History  Diagnosis Date  . Cancer (HCC)     hx - melanoma  . Depression     No need for therapy.  Improved  . Hyperlipidemia   . Hypertension   . UTI (lower urinary tract infection)   . Colon polyps   . Diverticula, colon   . Chicken pox   . Hemorrhoids     Medications:  Prescriptions prior to admission  Medication Sig Dispense Refill Last Dose  . clopidogrel (PLAVIX) 75 MG tablet TAKE ONE TABLET ('75MG'$  TOTAL) BY MOUTH DAILY 90 tablet 1 11/09/2015 at Unknown time  . esomeprazole (NEXIUM) 20 MG packet Take 20 mg by mouth daily before breakfast. OTC   11/09/2015 at Unknown time  . hydrochlorothiazide (HYDRODIURIL) 25 MG tablet TAKE 1 TABLET (25 MG TOTAL) BY MOUTH DAILY. 90 tablet 1 11/09/2015 at Unknown time  . ibuprofen (ADVIL,MOTRIN) 200 MG tablet Take 600 mg by mouth every 6 (six) hours as needed.   11/08/2015 at Unknown time  . metFORMIN (GLUCOPHAGE) 500 MG tablet TAKE ONE TABLET (50OMG TOTAL) BY MOUTH DAILY WITH BREAKFAST 30 tablet 5 11/09/2015 at Unknown time  . pravastatin (PRAVACHOL) 40 MG tablet TAKE 1 TABLET (40 MG TOTAL) BY MOUTH DAILY. 90 tablet 1 11/09/2015 at Unknown time  . quinapril (ACCUPRIL) 40 MG tablet TAKE ONE TABLET ('40MG'$  TOTAL)  BY MOUTH DAILY 90 tablet 1 11/09/2015 at Unknown time  . Na  Sulfate-K Sulfate-Mg Sulf SOLN Take 1 kit by mouth once. (Patient not taking: Reported on 11/09/2015) 354 mL 0 Not Taking at Unknown time   Scheduled:  . clopidogrel  75 mg Oral Daily  . enoxaparin (LOVENOX) injection  40 mg Subcutaneous Q24H  . piperacillin-tazobactam  3.375 g Intravenous Once  . pravastatin  40 mg Oral q1800  . vancomycin  1,000 mg Intravenous Once   Infusions:  . sodium chloride      Assessment: 75yo male c/o dizziness and near-syncope, found to be hypotensive, concern for sepsis, to begin IV ABX.  Goal of Therapy:  Vancomycin trough level 15-20 mcg/ml  Plan:  Rec'd vanc '1500mg'$  and Zosyn 3.375g IV in ED; will continue with vancomycin '1250mg'$  IV Q24H and Zosyn 3.375g IV Q8H and monitor CBC, Cx, levels prn.  Wynona Neat, PharmD, BCPS  11/10/2015,1:44 AM

## 2015-11-10 NOTE — Progress Notes (Signed)
Spoke with pt regarding unknown sepsis, he stated that he started having a sore tooth on the right side a couple of weeks ago and saw dentist about it. The dentist stated that he noticed infection in the tooth and started pt on "low dose penicillin". Pt took three doses a day x 3 days and then stopped because he thought it was making him feel worse. BP is 87/56 and rectal temp 99.8. MD made aware, 577ml bolus given.

## 2015-11-10 NOTE — H&P (Addendum)
[facility] History and Physical  Brent Taylor HDQ:[id number] DOB: [birth year] DOA: 11/09/2015  Referring physician: Dr.Proulx. PCP: Brent Cowden, MD  Specialists: Dr.Hochrein. Cardiologist.  Chief Complaint: Fever and chills.  HPI: Brent Taylor is a 75 y.o. male history of hypertension and hyperlipidemia and history of melanoma presents to the ER because of fever and chills. Patient states his symptoms started last week. He has been having off and on symptoms of fever and chills. Last 2 days he was doing fine but later in the evening yesterday patient started having fever and chills again and felt very weak. Denies any chest pain shortness of breath productive cough nausea vomiting diarrhea. Had some symptoms of sore throat yesterday morning. In the ER patient is found to be hypotensive febrile. Blood cultures were obtained along with urine cultures. Chest x-ray does not show anything acute patient has been admitted for sepsis from unknown source. Patient denies any recent sick contacts travel insect bites or any change in medications.   Review of Systems: As presented in the history of presenting illness, rest negative.  Past Medical History  Diagnosis Date  . Cancer (HCC)     hx - melanoma  . Depression     No need for therapy.  Improved  . Hyperlipidemia   . Hypertension   . UTI (lower urinary tract infection)   . Colon polyps   . Diverticula, colon   . Chicken pox   . Hemorrhoids    Past Surgical History  Procedure Laterality Date  . Melanoma excision      LEFT CHEST  . Melanoma excision     Social History:  reports that he has never smoked. He has never used smokeless tobacco. He reports that he does not drink alcohol or use illicit drugs. Where does patient live home. Can patient participate in ADLs? Yes.  Allergies  Allergen Reactions  . Lipitor [Atorvastatin Calcium]     Muscle ache    Family History:  Family History  Problem Relation Age of Onset   . Heart Problems Mother     Skipping      Prior to Admission medications   Medication Sig Start Date End Date Taking? Authorizing Provider  clopidogrel (PLAVIX) 75 MG tablet TAKE ONE TABLET (75MG TOTAL) BY MOUTH DAILY 06/06/15  Yes Marletta Lor, MD  esomeprazole (NEXIUM) 20 MG packet Take 20 mg by mouth daily before breakfast. OTC   Yes Historical Provider, MD  hydrochlorothiazide (HYDRODIURIL) 25 MG tablet TAKE 1 TABLET (25 MG TOTAL) BY MOUTH DAILY. 03/12/15  Yes Marletta Lor, MD  ibuprofen (ADVIL,MOTRIN) 200 MG tablet Take 600 mg by mouth every 6 (six) hours as needed.   Yes Historical Provider, MD  metFORMIN (GLUCOPHAGE) 500 MG tablet TAKE ONE TABLET (50OMG TOTAL) BY MOUTH DAILY WITH BREAKFAST 09/01/15  Yes Marletta Lor, MD  pravastatin (PRAVACHOL) 40 MG tablet TAKE 1 TABLET (40 MG TOTAL) BY MOUTH DAILY. 06/06/15  Yes Marletta Lor, MD  quinapril (ACCUPRIL) 40 MG tablet TAKE ONE TABLET (40MG TOTAL)  BY MOUTH DAILY 08/25/15  Yes Marletta Lor, MD  Na Sulfate-K Sulfate-Mg Sulf SOLN Take 1 kit by mouth once. Patient not taking: Reported on 11/09/2015 10/28/15   Irene Shipper, MD    Physical Exam: Filed Vitals:   11/10/15 0030 11/10/15 0045 11/10/15 0100 11/10/15 0115  BP: 90/69 1_0  Pulse: 77 77 77 80  Temp:      TempSrc:      Resp: 18  _0 Height:      Weight:      SpO2: 98% 99% 100% 98%     General:  Moderately built and nourished.  Eyes: Anicteric. No pallor.  ENT: No discharge from the ears eyes nose or mouth.  Neck: No neck rigidity. No mass felt.  Cardiovascular: S1-S2 heard.  Respiratory: No rhonchi or crepitations.  Abdomen: Soft nontender bowel sounds present. No guarding or rigidity.  Skin: No rash.  Musculoskeletal: No edema.  Psychiatric: Appears normal.  Neurologic: Alert oriented to time place and person. Moves all extremities.  Labs on Admission:  Basic Metabolic Panel:  Recent Labs Lab  11/09/15 2215 11/09/15 2236  NA 141 141  K 3.5 3.4*  CL 101 100*  CO2 26  --   GLUCOSE 151* 149*  BUN 27* 30*  CREATININE 1.73* 1.60*  CALCIUM 9.8  --    Liver Function Tests: No results for input(s): AST, ALT, ALKPHOS, BILITOT, PROT, ALBUMIN in the last 168 hours. No results for input(s): LIPASE, AMYLASE in the last 168 hours. No results for input(s): AMMONIA in the last 168 hours. CBC:  Recent Labs Lab 11/09/15 2215 11/09/15 2236  WBC 7.5  --   HGB 16.6 17.7*  HCT 50.4 52.0  MCV 88.1  --   PLT 193  --    Cardiac Enzymes:  Recent Labs Lab 11/09/15 2215  CKTOTAL 40*    BNP (last 3 results)  Recent Labs  11/09/15 2215  BNP 33.7    ProBNP (last 3 results) No results for input(s): PROBNP in the last 8760 hours.  CBG:  Recent Labs Lab 11/09/15 2250  GLUCAP 131*    Radiological Exams on Admission: Dg Chest Portable 1 View  11/09/2015  CLINICAL DATA:  Initial evaluation for acute hypertension. EXAM: PORTABLE CHEST 1 VIEW COMPARISON:  None. FINDINGS: Allowing for technique, cardiac and mediastinal silhouettes are within normal limits. Probable hiatal hernia. Lungs are hypoinflated. Linear opacity left lung base most consistent with atelectasis. No other focal airspace disease. No pulmonary edema. No definite pleural effusion. No pneumothorax. No acute osseus abnormality. Surgical clips overlie the left axilla. IMPRESSION: 1. Shallow lung inflation with mild left basilar atelectasis. No other active cardiopulmonary disease. 2. Probable hiatal hernia. Electronically Signed   By: Jeannine Boga M.D.   On: 11/09/2015 23:31    EKG: Independently reviewed. Sinus tachycardia with premature atrial complexes and RBBB.  Assessment/Plan Principal Problem:   Sepsis ([place]) Active Problems:   Hypertension   Lactic acidosis   CKD (chronic kidney disease) stage 3, GFR 30-59 ml/min   1. Sepsis - source not clear. Follow blood cultures urine cultures lactic acid and  procalcitonin levels. Continue with aggressive hydration. Patient has received so far 5 L of normal saline bolus. Patient has been empirically placed on vancomycin and Zosyn. Follow influenza PCR. Check 2-D echo. 2. Hypertension - since patient is hypotensive I'm holding off patient's antihypertensives. 3. Hyperlipidemia on statins. 4. Chronic kidney disease stage II to 3 - creatinine appears to be at baseline. Follow metabolic panel. 5. History of prediabetes - hold metformin while inpatient patient will be on sliding scale coverage.  I did discuss with on-call pulmonary critical care and Dr. Janann Colonel.  DVT Prophylaxis Lovenox.  Code Status: Full code.  Family Communication: Patient's wife.  Disposition Plan: Admit to inpatient.    Refugio Mcconico N. [facility] Pager [phone].  If 7PM-7AM, please contact night-coverage [email or web address] Password [email or web address] 11/10/2015, 1:37 AM

## 2015-11-10 NOTE — Progress Notes (Signed)
TEAM 1 - Stepdown/ICU TEAM PROGRESS NOTE  Brent Taylor QG:[id number] DOB: [birth year] DOA: 11/09/2015 PCP: [name], MD  Admit HPI / Brief Narrative: 75 y.o. male w/ a history of hypertension, hyperlipidemia, and melanoma who presented to the ER because of fever and chills. Denied chest pain shortness of breath productive cough nausea vomiting diarrhea. Had some symptoms of sore throat.   In the ER patient was found to be hypotensive and febrile. Blood cultures were obtained along with urine cultures. Chest x-ray did not show anything acute.  Patient denied recent sick contacts travel insect bites or any change in medications.   HPI/Subjective: Pt seen for f/u visit.  Assessment/Plan:  Sepsis of unclear etiology  Hx of Hypertension  Hyperlipidemia  Chronic kidney disease stage 2-3  Hyperglycemia  Code Status: FULL Family Communication: no family present at time of exam Disposition Plan: SDU  Consultants: none  Procedures: none  Antibiotics: Zosyn 1/29 > Vanc 1/29 >  DVT prophylaxis: Lovenox  Objective: Blood pressure 103/79, pulse 88, temperature 97.9 F (36.6 C), temperature source Oral, resp. rate 19, height 6' (1.829 m), weight 111.948 kg (246 lb 12.8 oz), SpO2 97 %.  Intake/Output Summary (Last 24 hours) at 11/10/15 0933 Last data filed at 11/10/15 0700  Gross per 24 hour  Intake   6975 ml  Output    400 ml  Net   6575 ml   Exam: Pt seen for f/u visit.  Data Reviewed: Basic Metabolic Panel:  Recent Labs Lab 11/09/15 2215 11/09/15 2236 11/10/15 0323  NA 141 141 140  K 3.5 3.4* 3.7  CL 101 100* 108  CO2 26  --  22  GLUCOSE 151* 149* 160*  BUN 27* 30* 26*  CREATININE 1.73* 1.60* 1.73*  CALCIUM 9.8  --  7.9*    CBC:  Recent Labs Lab 11/09/15 2215 11/09/15 2236 11/10/15 0323  WBC 7.5  --  12.8*  NEUTROABS  --   --  12.1*  HGB 16.6 17.7* 13.3  HCT 50.4 52.0 41.1  MCV 88.1  --  88.2  PLT 193  --  164     Liver Function Tests:  Recent Labs Lab 11/10/15 0323  AST 47*  ALT 44  ALKPHOS 36*  BILITOT 0.7  PROT 5.2*  ALBUMIN 2.8*    Coags:  Recent Labs Lab 11/10/15 0323  INR 1.26    Recent Labs Lab 11/10/15 0323  APTT 26    Cardiac Enzymes:  Recent Labs Lab 11/09/15 2215 11/10/15 0323  CKTOTAL 40*  --   TROPONINI  --  <0.03    CBG:  Recent Labs Lab 11/09/15 2250 11/10/15 0856  GLUCAP 131* 177*    Recent Results (from the past 240 hour(s))  MRSA PCR Screening     Status: None   Collection Time: 11/10/15  1:43 AM  Result Value Ref Range Status   MRSA by PCR NEGATIVE NEGATIVE Final    Comment:        The GeneXpert MRSA Assay (FDA approved for NASAL specimens only), is one component of a comprehensive MRSA colonization surveillance program. It is not intended to diagnose MRSA infection nor to guide or monitor treatment for MRSA infections.      Studies:   Recent x-ray studies have been reviewed in detail by the Attending Physician  Scheduled Meds:  Scheduled Meds: . clopidogrel  75 mg Oral Daily  . enoxaparin (LOVENOX) injection  40 mg Subcutaneous Q24H  . insulin aspart  0-9 Units Subcutaneous  TID WC  . pantoprazole  40 mg Oral Q1200  . piperacillin-tazobactam (ZOSYN)  IV  3.375 g Intravenous Q8H  . pravastatin  40 mg Oral q1800  . vancomycin  1,250 mg Intravenous Q24H    Time spent on care of this patient: No charge   Cherene Altes , MD   [facility] Office  [phone] Pager - Text Page per Amion as per below:  On-Call/Text Page:      [email or web address]      password [email or web address]  If 7PM-7AM, please contact night-coverage [email or web address] Password [email or web address] 11/10/2015, 9:33 AM   LOS: 0 days

## 2015-11-10 NOTE — Telephone Encounter (Signed)
fyi pt is in [facility]

## 2015-11-10 NOTE — ED Notes (Signed)
Attempted to call report

## 2015-11-11 ENCOUNTER — Inpatient Hospital Stay (HOSPITAL_COMMUNITY): Payer: Medicare Other

## 2015-11-11 DIAGNOSIS — I10 Essential (primary) hypertension: Secondary | ICD-10-CM

## 2015-11-11 DIAGNOSIS — K047 Periapical abscess without sinus: Secondary | ICD-10-CM | POA: Diagnosis present

## 2015-11-11 DIAGNOSIS — E872 Acidosis: Secondary | ICD-10-CM

## 2015-11-11 DIAGNOSIS — G039 Meningitis, unspecified: Secondary | ICD-10-CM | POA: Diagnosis present

## 2015-11-11 DIAGNOSIS — A419 Sepsis, unspecified organism: Secondary | ICD-10-CM | POA: Diagnosis present

## 2015-11-11 DIAGNOSIS — N183 Chronic kidney disease, stage 3 (moderate): Secondary | ICD-10-CM

## 2015-11-11 DIAGNOSIS — R112 Nausea with vomiting, unspecified: Secondary | ICD-10-CM | POA: Diagnosis present

## 2015-11-11 DIAGNOSIS — I959 Hypotension, unspecified: Secondary | ICD-10-CM | POA: Diagnosis present

## 2015-11-11 LAB — COMPREHENSIVE METABOLIC PANEL
ALBUMIN: 2.7 g/dL — AB (ref 3.5–5.0)
ALT: 34 U/L (ref 17–63)
AST: 22 U/L (ref 15–41)
Alkaline Phosphatase: 35 U/L — ABNORMAL LOW (ref 38–126)
Anion gap: 10 (ref 5–15)
BUN: 18 mg/dL (ref 6–20)
CHLORIDE: 106 mmol/L (ref 101–111)
CO2: 22 mmol/L (ref 22–32)
CREATININE: 1.46 mg/dL — AB (ref 0.61–1.24)
Calcium: 7.9 mg/dL — ABNORMAL LOW (ref 8.9–10.3)
GFR calc Af Amer: 52 mL/min — ABNORMAL LOW (ref >60)
GFR calc non Af Amer: 45 mL/min — ABNORMAL LOW (ref >60)
GLUCOSE: 128 mg/dL — AB (ref 65–99)
POTASSIUM: 3.8 mmol/L (ref 3.5–5.1)
SODIUM: 138 mmol/L (ref 135–145)
Total Bilirubin: 0.7 mg/dL (ref 0.3–1.2)
Total Protein: 5.1 g/dL — ABNORMAL LOW (ref 6.5–8.1)

## 2015-11-11 LAB — URINE CULTURE

## 2015-11-11 LAB — CBC
HCT: 37.1 % — ABNORMAL LOW (ref 39.0–52.0)
Hemoglobin: 11.8 g/dL — ABNORMAL LOW (ref 13.0–17.0)
MCH: 27.9 pg (ref 26.0–34.0)
MCHC: 31.8 g/dL (ref 30.0–36.0)
MCV: 87.7 fL (ref 78.0–100.0)
PLATELETS: 171 10*3/uL (ref 150–400)
RBC: 4.23 MIL/uL (ref 4.22–5.81)
RDW: 15.5 % (ref 11.5–15.5)
WBC: 9.5 10*3/uL (ref 4.0–10.5)

## 2015-11-11 LAB — GLUCOSE, CAPILLARY
GLUCOSE-CAPILLARY: 122 mg/dL — AB (ref 65–99)
Glucose-Capillary: 117 mg/dL — ABNORMAL HIGH (ref 65–99)
Glucose-Capillary: 117 mg/dL — ABNORMAL HIGH (ref 65–99)
Glucose-Capillary: 124 mg/dL — ABNORMAL HIGH (ref 65–99)

## 2015-11-11 MED ORDER — GI COCKTAIL ~~LOC~~
30.0000 mL | Freq: Once | ORAL | Status: AC
  Administered 2015-11-11: 30 mL via ORAL
  Filled 2015-11-11: qty 30

## 2015-11-11 MED ORDER — HYDRALAZINE HCL 20 MG/ML IJ SOLN
5.0000 mg | Freq: Four times a day (QID) | INTRAMUSCULAR | Status: DC
  Administered 2015-11-11 – 2015-11-14 (×10): 5 mg via INTRAVENOUS
  Filled 2015-11-11 (×11): qty 1

## 2015-11-11 MED ORDER — METHOCARBAMOL 1000 MG/10ML IJ SOLN
500.0000 mg | Freq: Once | INTRAVENOUS | Status: AC
  Administered 2015-11-11: 500 mg via INTRAVENOUS
  Filled 2015-11-11: qty 5

## 2015-11-11 MED ORDER — SUMATRIPTAN SUCCINATE 6 MG/0.5ML ~~LOC~~ SOLN
6.0000 mg | Freq: Once | SUBCUTANEOUS | Status: AC
  Administered 2015-11-11: 6 mg via SUBCUTANEOUS
  Filled 2015-11-11: qty 0.5

## 2015-11-11 MED ORDER — SODIUM CHLORIDE 0.9 % IV SOLN
INTRAVENOUS | Status: DC
  Administered 2015-11-11: 22:00:00 via INTRAVENOUS
  Administered 2015-11-12: 75 mL/h via INTRAVENOUS

## 2015-11-11 MED ORDER — SODIUM CHLORIDE 0.9 % IV SOLN
25.0000 mg | Freq: Once | INTRAVENOUS | Status: AC
  Administered 2015-11-11: 25 mg via INTRAVENOUS
  Filled 2015-11-11: qty 1

## 2015-11-11 MED ORDER — DEXTROSE 5 % IV SOLN
2.0000 g | Freq: Two times a day (BID) | INTRAVENOUS | Status: DC
  Administered 2015-11-11 – 2015-11-15 (×8): 2 g via INTRAVENOUS
  Filled 2015-11-11 (×9): qty 2

## 2015-11-11 NOTE — Progress Notes (Signed)
Pharmacy Antibiotic Note  Brent Taylor is a 75 y.o. male admitted on 11/09/2015 with meningitis.  Pharmacy has been consulted for ceftriaxone dosing to be added to regimen as patient with continued headache that is not improving.  Plan: Ceftriaxone 2g IV q12h  Will follow work-up  Height: 6' (182.9 cm) Weight: 256 lb 4.8 oz (116.257 kg) IBW/kg (Calculated) : 77.6  Temp (24hrs), Avg:97.6 F (36.4 C), Min:97.3 F (36.3 C), Max:97.8 F (36.6 C)   Recent Labs Lab 11/09/15 2215  11/09/15 2236 11/09/15 2343 11/10/15 0123 11/10/15 0323 11/10/15 1353 11/10/15 1920 11/11/15 0252  WBC 7.5  --   --   --   --  12.8*  --   --  9.5  CREATININE 1.73*  --  1.60*  --   --  1.73*  --   --  1.46*  LATICACIDVEN  --   < > 4.57* 2.93* 3.87* 3.3* 1.6 1.6  --   < > = values in this interval not displayed.  Estimated Creatinine Clearance: 56.7 mL/min (by C-G formula based on Cr of 1.46).    Allergies  Allergen Reactions  . Lipitor [Atorvastatin Calcium]     Muscle ache    Antimicrobials this admission: Ceftriaxone 1/31>> Vanc 1/29 >> Zosyn 1/29 >>  Dose adjustments this admission: N/A  Microbiology results: 1/31 urine: sent 1/30 MRSA PCR neg 1/29 UCx: multiple species present 1/29 BCx2: ngtd 1/29 flu panel neg   Thank you for allowing pharmacy to be a part of this patient's care.  Rishon Thilges D. Brent Taylor, PharmD, BCPS Clinical Pharmacist Pager: [phone] 11/11/2015 8:14 PM

## 2015-11-11 NOTE — Progress Notes (Signed)
Comments:  Notified by RN that radiology has called to inform that Fluoro-guided LP can not be done until MD has attempted first. Fluoro-guided LP is performed when MD is unable to make a successful attempt. Ordering MD not on call. Currently pt reporting a mild 3-4/10 h/a w/ no neck pain or stiffness. He is able to touch his chin to his chest and turn head from side to side w/o difficulty. He is alert and oriented. VS remain stable. Ct head findings c/w odontogenic infection to (L) mandible. Discussed pt w/ Dr Halford Chessman w/ [facility]. Based on current assessment Dr Halford Chessman agrees LP may not be indicated, certainly not on an urgent basis. Will communicate this to rounding MD in am who will re-assess need for LP. Will continue to monitor closely on SDU.  Jeryl Columbia, NP-C [facility] Pager [phone]

## 2015-11-11 NOTE — Progress Notes (Signed)
Utilization review completed. Renea Schoonmaker, RN, BSN. 

## 2015-11-11 NOTE — Progress Notes (Signed)
[place] TEAM 1 - Stepdown/ICU TEAM Progress Note  Brent Taylor [id number] DOB: [birth year] DOA: 11/09/2015 PCP: [name], MD  Admit HPI / Brief Narrative: 75 y.o. WM PMHx Depression, HTN, HLD, Melanoma, Colon Polyps   Presents to the ER because of fever and chills. Patient states his symptoms started last week. He has been having off and on symptoms of fever and chills. Last 2 days he was doing fine but later in the evening yesterday patient started having fever and chills again and felt very weak. Denies any chest pain shortness of breath productive cough nausea vomiting diarrhea. Had some symptoms of sore throat yesterday morning. In the ER patient is found to be hypotensive febrile. Blood cultures were obtained along with urine cultures. Chest x-ray does not show anything acute patient has been admitted for sepsis from unknown source. Patient denies any recent sick contacts travel insect bites or any change in medications.   HPI/Subjective: 1/31 A/O x4 states was seen by his dentist last week and was told he had an infection. Started on penicillin at that time but did not complete the full course of antibiotics.   Assessment/Plan: Sepsis unspecified organism/Tooth Abscess?  - Most likely 2dary to tooth abscess continue current Abx -Obtain Appoint with Jude Aucion DDS for eval abscess -Echocardiogram pending -Normal saline 6ml/hr  Migraine/Neck pain, Stiffness;  -Pt w/Lt>Rt upper Trapezius and Paracervical muscle spasms. Pain starting in the occipital area and radiating behind bilateral eyes -Sumatriptan 6 mg injection 1 -Robaxin 500 mg 1 -ADDENDUM; patient continues to have severe migraine and nausea. Patient with what appears to be a abscess tooth. -DDX, Bacterial meningitis?, Aseptic meningitis? CVA?, would be an atypical presentation but given patient's refractory migraine, neck stiffness and pain, Obtain Head CT and LP  Nausea/Dry Heaves -SBO?, ileus? Obtain  KUB -Patient may possibly require CT abdomen with contrast -Thorazine 25 mg 1; patient's QT interval borderline if additional treatment required would obtain another EKG prior to administering  Hypotension/Hypertension  - Patient now HTN hydralazine IV 5 mg  Hyperlipidemia - on statins.  Acute on Chronic kidney disease stage II to III (baseline Cr ~1.2) -Slightly elevated but trending down  Prediabetes  - hold metformin  -. Incidental SSI   Code Status: FULL Family Communication: family present at time of exam Disposition Plan: Will give patient another 24 hours of IV antibiotics. Ensure he has follow-up with his dentist prior to discharge    Consultants:   Procedure/Significant Events: 1/30 X-ray Orthopantogram; -LEFT mandibular periodontal lucency at teeth #19 and #20 question periapical abscess. -No RIGHT mandibular periodontal changes identified.   Culture 1/29 blood left arm/hand NGTD 1/29 urine positive multiple species 1/30 MRSA by PCR negative 1/31 urine pending   Antibiotics: Zosyn 1/30>> 1/31 Vancomycin 1/30>> Ceftriaxone 1/31>>    DVT prophylaxis: Lovenox   Devices NA  LINES / TUBES:  NA    Continuous Infusions: . sodium chloride      Objective: VITAL SIGNS: Temp: 97.7 F (36.5 C) (01/31 1600) Temp Source: Oral (01/31 1600) BP: 159/108 mmHg (01/31 1800) Pulse Rate: 69 (01/31 1800) SPO2; FIO2:   Intake/Output Summary (Last 24 hours) at 11/11/15 1955 Last data filed at 11/11/15 1100  Gross per 24 hour  Intake   2070 ml  Output   3200 ml  Net  -1130 ml     Exam: General: A/O 4, NAD, No acute respiratory distress Eyes: Negative headache, negative scleral hemorrhage ENT: Negative Runny nose, negative gingival bleeding, patient has multiple gold  crowns on his molars bilateral, negative erythema or visible discharge. Negative pain to palpation on molars or lymphadenopathy bilateral. Neck: Pain to palpation Flexation of  neck/Lt>Rt upper Trapezius and Paracervical muscle spasms. Pain starting in the occipital area and radiating behind bilateral eyes,JVD Lungs: Clear to auscultation bilaterally without wheezes or crackles Cardiovascular: Bradycardic, Regular rhythm without murmur gallop or rub normal S1 and S2 Abdomen:negative abdominal pain, nondistended, positive soft, bowel sounds, no rebound, no ascites, no appreciable mass Extremities: No significant cyanosis, clubbing, or edema bilateral lower extremities Psychiatric:  Negative depression, negative anxiety, negative fatigue, negative mania  Neurologic:  Cranial nerves II through XII intact, tongue/uvula midline, all extremities muscle strength 5/5, sensation intact throughout, negative dysarthria, negative expressive aphasia, negative receptive aphasia.   Data Reviewed: Basic Metabolic Panel:  Recent Labs Lab 11/09/15 2215 11/09/15 2236 11/10/15 0323 11/11/15 0252  NA 141 141 140 138  K 3.5 3.4* 3.7 3.8  CL 101 100* 108 106  CO2 26  --  22 22  GLUCOSE 151* 149* 160* 128*  BUN 27* 30* 26* 18  CREATININE 1.73* 1.60* 1.73* 1.46*  CALCIUM 9.8  --  7.9* 7.9*   Liver Function Tests:  Recent Labs Lab 11/10/15 0323 11/11/15 0252  AST 47* 22  ALT 44 34  ALKPHOS 36* 35*  BILITOT 0.7 0.7  PROT 5.2* 5.1*  ALBUMIN 2.8* 2.7*   No results for input(s): LIPASE, AMYLASE in the last 168 hours. No results for input(s): AMMONIA in the last 168 hours. CBC:  Recent Labs Lab 11/09/15 2215 11/09/15 2236 11/10/15 0323 11/11/15 0252  WBC 7.5  --  12.8* 9.5  NEUTROABS  --   --  12.1*  --   HGB 16.6 17.7* 13.3 11.8*  HCT 50.4 52.0 41.1 37.1*  MCV 88.1  --  88.2 87.7  PLT 193  --  164 171   Cardiac Enzymes:  Recent Labs Lab 11/09/15 2215 11/10/15 0323  CKTOTAL 40*  --   TROPONINI  --  <0.03   BNP (last 3 results)  Recent Labs  11/09/15 2215  BNP 33.7    ProBNP (last 3 results) No results for input(s): PROBNP in the last 8760  hours.  CBG:  Recent Labs Lab 11/10/15 1647 11/10/15 2204 11/11/15 0734 11/11/15 1224 11/11/15 1705  GLUCAP 130* 108* 122* 117* 117*    Recent Results (from the past 240 hour(s))  Blood culture (routine x 2)     Status: None (Preliminary result)   Collection Time: 11/09/15 10:35 PM  Result Value Ref Range Status   Specimen Description BLOOD LEFT ARM  Final   Special Requests BOTTLES DRAWN AEROBIC AND ANAEROBIC 5CC   Final   Culture NO GROWTH 2 DAYS  Final   Report Status PENDING  Incomplete  Blood culture (routine x 2)     Status: None (Preliminary result)   Collection Time: 11/09/15 10:40 PM  Result Value Ref Range Status   Specimen Description BLOOD LEFT HAND  Final   Special Requests BOTTLES DRAWN AEROBIC AND ANAEROBIC 5CC   Final   Culture NO GROWTH 2 DAYS  Final   Report Status PENDING  Incomplete  Urine culture     Status: None   Collection Time: 11/09/15 11:15 PM  Result Value Ref Range Status   Specimen Description URINE, CLEAN CATCH  Final   Special Requests NONE  Final   Culture MULTIPLE SPECIES PRESENT, SUGGEST RECOLLECTION  Final   Report Status 11/11/2015 FINAL  Final  MRSA PCR Screening  Status: None   Collection Time: 11/10/15  1:43 AM  Result Value Ref Range Status   MRSA by PCR NEGATIVE NEGATIVE Final    Comment:        The GeneXpert MRSA Assay (FDA approved for NASAL specimens only), is one component of a comprehensive MRSA colonization surveillance program. It is not intended to diagnose MRSA infection nor to guide or monitor treatment for MRSA infections.      Studies:  Recent x-ray studies have been reviewed in detail by the Attending Physician  Scheduled Meds:  Scheduled Meds: . chlorproMAZINE (THORAZINE) IV  25 mg Intravenous Once  . clopidogrel  75 mg Oral Daily  . enoxaparin (LOVENOX) injection  40 mg Subcutaneous Q24H  . hydrALAZINE  5 mg Intravenous Q6H  . insulin aspart  0-9 Units Subcutaneous TID WC  . pantoprazole  40  mg Oral Q1200  . pravastatin  40 mg Oral q1800  . vancomycin  1,250 mg Intravenous Q24H    Time spent on care of this patient: 40 mins   Laney Louderback, Geraldo Docker , MD  [facility] Office  [phone] Pager [phone]  On-Call/Text Page:      [email or web address]      password [email or web address]  If 7PM-7AM, please contact night-coverage [email or web address] Password [email or web address] 11/11/2015, 7:55 PM   LOS: 1 day   Care during the described time interval was provided by me .  I have reviewed this patient's available data, including medical history, events of note, physical examination, and all test results as part of my evaluation. I have personally reviewed and interpreted all radiology studies.   Dia Crawford, MD [phone] Pager

## 2015-11-12 ENCOUNTER — Ambulatory Visit (HOSPITAL_COMMUNITY): Payer: Medicare Other

## 2015-11-12 DIAGNOSIS — R509 Fever, unspecified: Secondary | ICD-10-CM

## 2015-11-12 LAB — URINE CULTURE: CULTURE: NO GROWTH

## 2015-11-12 LAB — GLUCOSE, CAPILLARY
GLUCOSE-CAPILLARY: 121 mg/dL — AB (ref 65–99)
Glucose-Capillary: 126 mg/dL — ABNORMAL HIGH (ref 65–99)
Glucose-Capillary: 152 mg/dL — ABNORMAL HIGH (ref 65–99)
Glucose-Capillary: 129 mg/dL — ABNORMAL HIGH (ref 65–99)

## 2015-11-12 LAB — PROCALCITONIN: PROCALCITONIN: 4.05 ng/mL

## 2015-11-12 MED ORDER — AMLODIPINE BESYLATE 10 MG PO TABS
10.0000 mg | ORAL_TABLET | Freq: Every day | ORAL | Status: DC
  Administered 2015-11-12 – 2015-11-16 (×5): 10 mg via ORAL
  Filled 2015-11-12 (×5): qty 1

## 2015-11-12 MED ORDER — LIDOCAINE 5 % EX PTCH: 1.0000 | MEDICATED_PATCH | Freq: Every day | CUTANEOUS | Status: DC | PRN

## 2015-11-12 MED ORDER — OXYCODONE HCL 5 MG PO TABS: 5.0000 mg | ORAL_TABLET | ORAL | Status: DC | PRN

## 2015-11-12 MED ORDER — HYDROMORPHONE HCL 1 MG/ML IJ SOLN
1.0000 mg | INTRAMUSCULAR | Status: DC | PRN
  Administered 2015-11-12 – 2015-11-13 (×3): 1 mg via INTRAVENOUS
  Filled 2015-11-12 (×3): qty 1

## 2015-11-12 MED ORDER — DEXTROSE 5 % IV SOLN
500.0000 mg | Freq: Four times a day (QID) | INTRAVENOUS | Status: DC | PRN
  Administered 2015-11-12: 500 mg via INTRAVENOUS
  Filled 2015-11-12: qty 5

## 2015-11-12 MED ORDER — MORPHINE SULFATE (PF) 2 MG/ML IV SOLN: 1.0000 mg | INTRAVENOUS | Status: DC | PRN

## 2015-11-12 MED ORDER — FENTANYL CITRATE (PF) 100 MCG/2ML IJ SOLN
25.0000 ug | INTRAMUSCULAR | Status: DC | PRN
  Administered 2015-11-12: 25 ug via INTRAVENOUS
  Filled 2015-11-12 (×2): qty 2

## 2015-11-12 NOTE — Progress Notes (Signed)
  Echocardiogram 2D Echocardiogram has been performed.  Brent Taylor 11/12/2015, 9:21 AM

## 2015-11-12 NOTE — Progress Notes (Addendum)
Brent Taylor TEAM 1 - Stepdown/ICU TEAM PROGRESS NOTE  Brent Taylor [id number] DOB: 1941-05-11 DOA: 11/09/2015 PCP: [name], MD  Admit HPI / Brief Narrative: 75 y.o. male w/ a history of hypertension, hyperlipidemia, and melanoma who presented to the ER because of fever and chills. Denied chest pain shortness of breath productive cough nausea vomiting diarrhea. Had some symptoms of sore throat.   In the ER patient was found to be hypotensive and febrile. Blood cultures were obtained along with urine cultures. Chest x-ray did not show anything acute.  Patient denied recent sick contacts travel insect bites or any change in medications.   HPI/Subjective: The patient states that he felt great this morning when he woke up.  His headache had resolved and he no longer had nausea.  Since that time however he has had progressive worsening of severe pain at the base of his neck posteriorly.  He describes a severe pinching cramping type pain.  He denies any headache or photophobia nausea or vomiting today.  He does not wish to take oxycodone because he feels this is the cause of his nausea yesterday.  He denies focal neurologic abnormalities or radicular type pain.  He denies chest pain fevers chills or abdominal pain.  He specifically denies any significant jaw pain or mouth pain.  Assessment/Plan:  Sepsis of unclear etiology  BP has recovered/is now elevated - HR is essentially normal - pt is afebrile, and WBC now normal - clinically his sepsis has essentially cleared and he is much more stable - the exact etiology of his initial sepsis remains unclear - thus far the only significant finding is that of a carious tooth and another tooth with a periapical abscess - neither of these seem impressive enough to lead to the impressive sepsis he had at his presentation - nonetheless he is improving - narrow abx to rocephin alone and follow (MRSA screen negative)  Abscessed tooth - dental carries    As discussed above I doubt that this dental infection is severe enough to lead to the extent of sepsis he was suffering with - continue antibiotics and follow - no jaw pain or even difficulty chewing at this time   Severe neck pain No significant findings on physical exam - no focal neurologic deficits - cervical abscess remains in the differential though he does not appear to have any risk factors for such an infection - an eventual MRI could help shed light but currently he is in much too much pain to allow for adequate imaging - treat pain and follow  Hypertension Patient was markedly hypotensive initially - blood pressure is now improved and is actually quite elevated with his acute pain - I will avoid overcorrection and instead treat his pain and follow his blood pressure trend  Hyperlipidemia Holding treatment for now as patient is eating very little  Chronic kidney disease stage 2-3 Creatinine has steadily improved with volume resuscitation  DM2 CBG well controlled at present - holding Glucophage with very poor intake  Code Status: FULL Family Communication: Spoke at length with wife and daughter at bedside Disposition Plan: SDU  Consultants: none  Procedures: none  Antibiotics: Zosyn 1/29 > 1/31 Vanc 1/29 > 2/1 Roxephin 1/31 >  DVT prophylaxis: Lovenox  Objective: Blood pressure 145/76, pulse 99, temperature 97.9 F (36.6 C), temperature source Oral, resp. rate 19, height 6' (1.829 m), weight 117.6 kg (259 lb 4.2 oz), SpO2 94 %.  Intake/Output Summary (Last 24 hours) at 11/12/15 1331 Last data  filed at 11/12/15 0900  Gross per 24 hour  Intake 1216.25 ml  Output   1875 ml  Net -658.75 ml   Exam: General: No acute respiratory distress - alert and oriented  Lungs: Clear to auscultation bilaterally without wheezes or crackles Cardiovascular: Tachycardic but regular without murmur gallop or rub normal S1 and S2 Abdomen: Nontender, nondistended, soft, bowel  sounds positive, no rebound, no ascites, no appreciable mass Extremities: No significant cyanosis, clubbing, or edema bilateral lower extremities Neuro:  Alert and oriented - no focal neuro deficits - no photophobia - no gross abnormality or pain w/ heavy palpation of spine from occiput to sacrum    Data Reviewed: Basic Metabolic Panel:  Recent Labs Lab 11/09/15 2215 11/09/15 2236 11/10/15 0323 11/11/15 0252  NA 141 141 140 138  K 3.5 3.4* 3.7 3.8  CL 101 100* 108 106  CO2 26  --  22 22  GLUCOSE 151* 149* 160* 128*  BUN 27* 30* 26* 18  CREATININE 1.73* 1.60* 1.73* 1.46*  CALCIUM 9.8  --  7.9* 7.9*    CBC:  Recent Labs Lab 11/09/15 2215 11/09/15 2236 11/10/15 0323 11/11/15 0252  WBC 7.5  --  12.8* 9.5  NEUTROABS  --   --  12.1*  --   HGB 16.6 17.7* 13.3 11.8*  HCT 50.4 52.0 41.1 37.1*  MCV 88.1  --  88.2 87.7  PLT 193  --  164 171    Liver Function Tests:  Recent Labs Lab 11/10/15 0323 11/11/15 0252  AST 47* 22  ALT 44 34  ALKPHOS 36* 35*  BILITOT 0.7 0.7  PROT 5.2* 5.1*  ALBUMIN 2.8* 2.7*    Coags:  Recent Labs Lab 11/10/15 0323  INR 1.26    Recent Labs Lab 11/10/15 0323  APTT 26    Cardiac Enzymes:  Recent Labs Lab 11/09/15 2215 11/10/15 0323  CKTOTAL 40*  --   TROPONINI  --  <0.03    CBG:  Recent Labs Lab 11/11/15 1224 11/11/15 1705 11/11/15 2209 11/12/15 0734 11/12/15 1222  GLUCAP 117* 117* 124* 126* 121*    Recent Results (from the past 240 hour(s))  Blood culture (routine x 2)     Status: None (Preliminary result)   Collection Time: 11/09/15 10:35 PM  Result Value Ref Range Status   Specimen Description BLOOD LEFT ARM  Final   Special Requests BOTTLES DRAWN AEROBIC AND ANAEROBIC 5CC   Final   Culture NO GROWTH 3 DAYS  Final   Report Status PENDING  Incomplete  Blood culture (routine x 2)     Status: None (Preliminary result)   Collection Time: 11/09/15 10:40 PM  Result Value Ref Range Status   Specimen  Description BLOOD LEFT HAND  Final   Special Requests BOTTLES DRAWN AEROBIC AND ANAEROBIC 5CC   Final   Culture NO GROWTH 3 DAYS  Final   Report Status PENDING  Incomplete  Urine culture     Status: None   Collection Time: 11/09/15 11:15 PM  Result Value Ref Range Status   Specimen Description URINE, CLEAN CATCH  Final   Special Requests NONE  Final   Culture MULTIPLE SPECIES PRESENT, SUGGEST RECOLLECTION  Final   Report Status 11/11/2015 FINAL  Final  MRSA PCR Screening     Status: None   Collection Time: 11/10/15  1:43 AM  Result Value Ref Range Status   MRSA by PCR NEGATIVE NEGATIVE Final    Comment:        The  GeneXpert MRSA Assay (FDA approved for NASAL specimens only), is one component of a comprehensive MRSA colonization surveillance program. It is not intended to diagnose MRSA infection nor to guide or monitor treatment for MRSA infections.   Culture, Urine     Status: None (Preliminary result)   Collection Time: 11/11/15  5:00 PM  Result Value Ref Range Status   Specimen Description URINE, CLEAN CATCH  Final   Special Requests NONE  Final   Culture NO GROWTH < 24 HOURS  Final   Report Status PENDING  Incomplete     Studies:   Recent x-ray studies have been reviewed in detail by the Attending Physician  Scheduled Meds:  Scheduled Meds: . cefTRIAXone (ROCEPHIN)  IV  2 g Intravenous Q12H  . clopidogrel  75 mg Oral Daily  . enoxaparin (LOVENOX) injection  40 mg Subcutaneous Q24H  . hydrALAZINE  5 mg Intravenous Q6H  . insulin aspart  0-9 Units Subcutaneous TID WC  . pantoprazole  40 mg Oral Q1200  . pravastatin  40 mg Oral q1800  . vancomycin  1,250 mg Intravenous Q24H    Time spent on care of this patient: 35 mins   Keilany Burnette T , MD   [facility] Office  [phone] Pager - Text Page per Shea Evans as per below:  On-Call/Text Page:      [email or web address]      password [email or web address]  If 7PM-7AM, please contact night-coverage [email or web address] Password  [email or web address] 11/12/2015, 1:31 PM   LOS: 2 days

## 2015-11-13 ENCOUNTER — Inpatient Hospital Stay (HOSPITAL_COMMUNITY): Payer: Medicare Other

## 2015-11-13 DIAGNOSIS — L0291 Cutaneous abscess, unspecified: Secondary | ICD-10-CM | POA: Diagnosis present

## 2015-11-13 DIAGNOSIS — N189 Chronic kidney disease, unspecified: Secondary | ICD-10-CM

## 2015-11-13 DIAGNOSIS — M542 Cervicalgia: Secondary | ICD-10-CM | POA: Diagnosis present

## 2015-11-13 DIAGNOSIS — I5042 Chronic combined systolic (congestive) and diastolic (congestive) heart failure: Secondary | ICD-10-CM | POA: Diagnosis present

## 2015-11-13 DIAGNOSIS — N179 Acute kidney failure, unspecified: Secondary | ICD-10-CM | POA: Diagnosis present

## 2015-11-13 DIAGNOSIS — I10 Essential (primary) hypertension: Secondary | ICD-10-CM | POA: Diagnosis present

## 2015-11-13 DIAGNOSIS — I5041 Acute combined systolic (congestive) and diastolic (congestive) heart failure: Secondary | ICD-10-CM

## 2015-11-13 LAB — CBC
HEMATOCRIT: 42 % (ref 39.0–52.0)
HEMOGLOBIN: 14 g/dL (ref 13.0–17.0)
MCH: 28.9 pg (ref 26.0–34.0)
MCHC: 33.3 g/dL (ref 30.0–36.0)
MCV: 86.8 fL (ref 78.0–100.0)
Platelets: 187 10*3/uL (ref 150–400)
RBC: 4.84 MIL/uL (ref 4.22–5.81)
RDW: 15.3 % (ref 11.5–15.5)
WBC: 11 10*3/uL — ABNORMAL HIGH (ref 4.0–10.5)

## 2015-11-13 LAB — COMPREHENSIVE METABOLIC PANEL
ALBUMIN: 2.9 g/dL — AB (ref 3.5–5.0)
ALK PHOS: 38 U/L (ref 38–126)
ALT: 22 U/L (ref 17–63)
ANION GAP: 10 (ref 5–15)
AST: 14 U/L — AB (ref 15–41)
BUN: 12 mg/dL (ref 6–20)
CO2: 24 mmol/L (ref 22–32)
Calcium: 8.8 mg/dL — ABNORMAL LOW (ref 8.9–10.3)
Chloride: 104 mmol/L (ref 101–111)
Creatinine, Ser: 1.23 mg/dL (ref 0.61–1.24)
GFR calc Af Amer: >60 mL/min (ref >60)
GFR calc non Af Amer: 55 mL/min — ABNORMAL LOW (ref >60)
GLUCOSE: 134 mg/dL — AB (ref 65–99)
POTASSIUM: 3.9 mmol/L (ref 3.5–5.1)
SODIUM: 138 mmol/L (ref 135–145)
Total Bilirubin: 0.7 mg/dL (ref 0.3–1.2)
Total Protein: 6.2 g/dL — ABNORMAL LOW (ref 6.5–8.1)

## 2015-11-13 LAB — C-REACTIVE PROTEIN: CRP: 10.3 mg/dL — ABNORMAL HIGH (ref <1.0)

## 2015-11-13 LAB — GLUCOSE, CAPILLARY
GLUCOSE-CAPILLARY: 127 mg/dL — AB (ref 65–99)
Glucose-Capillary: 114 mg/dL — ABNORMAL HIGH (ref 65–99)
Glucose-Capillary: 126 mg/dL — ABNORMAL HIGH (ref 65–99)
Glucose-Capillary: 125 mg/dL — ABNORMAL HIGH (ref 65–99)

## 2015-11-13 MED ORDER — ZOLPIDEM TARTRATE 5 MG PO TABS
5.0000 mg | ORAL_TABLET | Freq: Every evening | ORAL | Status: DC | PRN
  Administered 2015-11-13 – 2015-11-15 (×3): 5 mg via ORAL
  Filled 2015-11-13 (×3): qty 1

## 2015-11-13 MED ORDER — PRAVASTATIN SODIUM 40 MG PO TABS
40.0000 mg | ORAL_TABLET | Freq: Every day | ORAL | Status: DC
  Administered 2015-11-13 – 2015-11-15 (×3): 40 mg via ORAL
  Filled 2015-11-13 (×3): qty 1

## 2015-11-13 MED ORDER — GADOBENATE DIMEGLUMINE 529 MG/ML IV SOLN
20.0000 mL | Freq: Once | INTRAVENOUS | Status: AC | PRN
  Administered 2015-11-13: 20 mL via INTRAVENOUS

## 2015-11-13 MED ORDER — METOPROLOL TARTRATE 12.5 MG HALF TABLET
12.5000 mg | ORAL_TABLET | Freq: Two times a day (BID) | ORAL | Status: DC
  Administered 2015-11-13 (×2): 12.5 mg via ORAL
  Filled 2015-11-13 (×2): qty 1

## 2015-11-13 NOTE — Care Management Important Message (Signed)
Important Message  Patient Details  Name: Brent Taylor MRN: [id number] Date of Birth: 06-21-1941   Medicare Important Message Given:  Yes    [name] [name] 11/13/2015, 11:15 AM

## 2015-11-13 NOTE — Progress Notes (Signed)
[place] TEAM 1 - Stepdown/ICU TEAM Progress Note  Brent Taylor TN:[id number] DOB: 09-28-1941 DOA: 11/09/2015 PCP: [name], MD  Admit HPI / Brief Narrative: 75 y.o. WM PMHx Depression, HTN, HLD, Melanoma, Colon Polyps   Presents to the ER because of fever and chills. Patient states his symptoms started last week. He has been having off and on symptoms of fever and chills. Last 2 days he was doing fine but later in the evening yesterday patient started having fever and chills again and felt very weak. Denies any chest pain shortness of breath productive cough nausea vomiting diarrhea. Had some symptoms of sore throat yesterday morning. In the ER patient is found to be hypotensive febrile. Blood cultures were obtained along with urine cultures. Chest x-ray does not show anything acute patient has been admitted for sepsis from unknown source. Patient denies any recent sick contacts travel insect bites or any change in medications.   HPI/Subjective: 2/2 A/O x4 neck pain has now resolved (on Dilaudid q3hrs PRN).,    Assessment/Plan: Sepsis unspecified organism/Tooth Abscess?  - Most likely 2dary to tooth abscess continue current Abx -Obtain Appoint with Jude Aucion DDS for eval abscess -Echocardiogram; CHF see results below   Migraine/Neck pain, Stiffness;  -Pt w/Lt>Rt upper Trapezius and Paracervical muscle spasms. Pain starting in the occipital area and radiating behind bilateral eyes -Robaxin 500 mg  QID PRN -ADDENDUM; patient continues to have severe migraine and nausea. Patient with what appears to be a abscess tooth. -DDX, Bacterial meningitis?, Aseptic meningitis? CVA?, would be an atypical presentation but given patient's refractory migraine, neck stiffness and pain. -Head CT; Odontogenic infection? See results below -LP not done, at this point would be extremely low yield secondary to being on antibiotics for multiple days  Nausea/Dry Heaves -1/31 KUB negative    -Resolved  Systolic and diastolic CHF -Start low-dose Metoprolol 12.5 mg BID - Patient now HTN hydralazine IV 5 mg  Hypotension/Hypertension  -See CHF  Hyperlipidemia - Restart Pravachol 40 mg daily  Acute on Chronic kidney disease stage II to III (baseline Cr ~1.2) -Slightly elevated but trending down  Prediabetes  - hold metformin  -. Sensitive SSI   Code Status: FULL Family Communication: family present at time of exam Disposition Plan: Appointment with Jude Aucoin DDS on 7 February   Consultants: NA  Procedure/Significant Events: 1/30 X-ray Orthopantogram; -LEFT mandibular periodontal lucency at teeth #19 and #20 question periapical abscess. -No RIGHT mandibular periodontal changes identified. 1/31 CT head without contrast;-No acute intracranial abnormality. -Periapical lucency involving the left side of the mandible. Odontogenic infection? and recommend clinical correlation in this area. - caries involving Rt lower third molar.-Paranasal sinus disease. 1/31 KUB; negative 2/1 echocardiogram;- Left ventricle: mildly dilated. LVEF=40% to 45%. Hypokinesis of inferoseptal myocardium. -(grade 1 diastolic dysfunction).    Culture 1/29 blood left arm/hand NGTD 1/29 urine positive multiple species 1/30 MRSA by PCR negative 1/31 urine negative final   Antibiotics: Zosyn 1/30>> 1/31 Vancomycin 1/30>> 2/1 Ceftriaxone 1/31>>    DVT prophylaxis: Lovenox   Devices NA  LINES / TUBES:  NA    Continuous Infusions:    Objective: VITAL SIGNS: Temp: 98.6 F (37 C) (02/02 1200) Temp Source: Oral (02/02 1200) BP: 131/83 mmHg (02/02 1200) Pulse Rate: 83 (02/02 1200) SPO2; FIO2:   Intake/Output Summary (Last 24 hours) at 11/13/15 1340 Last data filed at 11/12/15 2233  Gross per 24 hour  Intake      0 ml  Output    325 ml  Net   -325 ml     Exam: General: A/O 4, NAD, No acute respiratory distress Eyes: Negative headache, negative scleral  hemorrhage ENT: Negative Runny nose, negative gingival bleeding, patient has multiple gold crowns on his molars bilateral, negative erythema or visible discharge. Negative pain to palpation on molars or lymphadenopathy bilateral. Neck: Negative Pain to palpation Flexation of neck, positive Lt>Rt upper Trapezius and Paracervical muscle spasms nonpainful (on Dilaudid).  Lungs: Clear to auscultation bilaterally without wheezes or crackles Cardiovascular: Regular rhythm and rate without murmur gallop or rub normal S1 and S2 Abdomen:negative abdominal pain, nondistended, positive soft, bowel sounds, no rebound, no ascites, no appreciable mass Extremities: No significant cyanosis, clubbing, or edema bilateral lower extremities Psychiatric:  Negative depression, negative anxiety, negative fatigue, negative mania  Neurologic:  Cranial nerves II through XII intact, tongue/uvula midline, all extremities muscle strength 5/5, sensation intact throughout, negative dysarthria, negative expressive aphasia, negative receptive aphasia. Negative Babinski/Kernig sign   Data Reviewed: Basic Metabolic Panel:  Recent Labs Lab 11/09/15 2215 11/09/15 2236 11/10/15 0323 11/11/15 0252 11/13/15 0518  NA 141 141 140 138 138  K 3.5 3.4* 3.7 3.8 3.9  CL 101 100* 108 106 104  CO2 26  --  22 22 24   GLUCOSE 151* 149* 160* 128* 134*  BUN 27* 30* 26* 18 12  CREATININE 1.73* 1.60* 1.73* 1.46* 1.23  CALCIUM 9.8  --  7.9* 7.9* 8.8*   Liver Function Tests:  Recent Labs Lab 11/10/15 0323 11/11/15 0252 11/13/15 0518  AST 47* 22 14*  ALT 44 34 22  ALKPHOS 36* 35* 38  BILITOT 0.7 0.7 0.7  PROT 5.2* 5.1* 6.2*  ALBUMIN 2.8* 2.7* 2.9*   No results for input(s): LIPASE, AMYLASE in the last 168 hours. No results for input(s): AMMONIA in the last 168 hours. CBC:  Recent Labs Lab 11/09/15 2215 11/09/15 2236 11/10/15 0323 11/11/15 0252 11/13/15 0518  WBC 7.5  --  12.8* 9.5 11.0*  NEUTROABS  --   --  12.1*  --    --   HGB 16.6 17.7* 13.3 11.8* 14.0  HCT 50.4 52.0 41.1 37.1* 42.0  MCV 88.1  --  88.2 87.7 86.8  PLT 193  --  164 171 187   Cardiac Enzymes:  Recent Labs Lab 11/09/15 2215 11/10/15 0323  CKTOTAL 40*  --   TROPONINI  --  <0.03   BNP (last 3 results)  Recent Labs  11/09/15 2215  BNP 33.7    ProBNP (last 3 results) No results for input(s): PROBNP in the last 8760 hours.  CBG:  Recent Labs Lab 11/12/15 1222 11/12/15 1817 11/12/15 2150 11/13/15 0820 11/13/15 1224  GLUCAP 121* 152* 129* 114* 126*    Recent Results (from the past 240 hour(s))  Blood culture (routine x 2)     Status: None (Preliminary result)   Collection Time: 11/09/15 10:35 PM  Result Value Ref Range Status   Specimen Description BLOOD LEFT ARM  Final   Special Requests BOTTLES DRAWN AEROBIC AND ANAEROBIC 5CC   Final   Culture NO GROWTH 3 DAYS  Final   Report Status PENDING  Incomplete  Blood culture (routine x 2)     Status: None (Preliminary result)   Collection Time: 11/09/15 10:40 PM  Result Value Ref Range Status   Specimen Description BLOOD LEFT HAND  Final   Special Requests BOTTLES DRAWN AEROBIC AND ANAEROBIC 5CC   Final   Culture NO GROWTH 3 DAYS  Final   Report Status  PENDING  Incomplete  Urine culture     Status: None   Collection Time: 11/09/15 11:15 PM  Result Value Ref Range Status   Specimen Description URINE, CLEAN CATCH  Final   Special Requests NONE  Final   Culture MULTIPLE SPECIES PRESENT, SUGGEST RECOLLECTION  Final   Report Status 11/11/2015 FINAL  Final  MRSA PCR Screening     Status: None   Collection Time: 11/10/15  1:43 AM  Result Value Ref Range Status   MRSA by PCR NEGATIVE NEGATIVE Final    Comment:        The GeneXpert MRSA Assay (FDA approved for NASAL specimens only), is one component of a comprehensive MRSA colonization surveillance program. It is not intended to diagnose MRSA infection nor to guide or monitor treatment for MRSA infections.     Culture, Urine     Status: None   Collection Time: 11/11/15  5:00 PM  Result Value Ref Range Status   Specimen Description URINE, CLEAN CATCH  Final   Special Requests NONE  Final   Culture NO GROWTH 1 DAY  Final   Report Status 11/12/2015 FINAL  Final     Studies:  Recent x-ray studies have been reviewed in detail by the Attending Physician  Scheduled Meds:  Scheduled Meds: . amLODipine  10 mg Oral Daily  . cefTRIAXone (ROCEPHIN)  IV  2 g Intravenous Q12H  . clopidogrel  75 mg Oral Daily  . enoxaparin (LOVENOX) injection  40 mg Subcutaneous Q24H  . hydrALAZINE  5 mg Intravenous Q6H  . insulin aspart  0-9 Units Subcutaneous TID WC  . metoprolol tartrate  12.5 mg Oral BID  . pantoprazole  40 mg Oral Q1200  . pravastatin  40 mg Oral q1800    Time spent on care of this patient: 40 mins   Damin Salido, Geraldo Docker , MD  [facility] Office  [phone] Pager - [phone]  On-Call/Text Page:      [email or web address]      password [email or web address]  If 7PM-7AM, please contact night-coverage [email or web address] Password [email or web address] 11/13/2015, 1:40 PM   LOS: 3 days   Care during the described time interval was provided by me .  I have reviewed this patient's available data, including medical history, events of note, physical examination, and all test results as part of my evaluation. I have personally reviewed and interpreted all radiology studies.   Dia Crawford, MD [phone] Pager

## 2015-11-13 NOTE — Progress Notes (Signed)
CCMD called RN to notify patient appeared to be in AFIB on the monitor. Dr. Sherral Hammers notified. Order received for 12 lead EKG. Will continue to monitor patient. Brent Taylor Brent Taylor

## 2015-11-14 LAB — CULTURE, BLOOD (ROUTINE X 2)
CULTURE: NO GROWTH
CULTURE: NO GROWTH

## 2015-11-14 LAB — GLUCOSE, CAPILLARY
GLUCOSE-CAPILLARY: 127 mg/dL — AB (ref 65–99)
Glucose-Capillary: 129 mg/dL — ABNORMAL HIGH (ref 65–99)
Glucose-Capillary: 121 mg/dL — ABNORMAL HIGH (ref 65–99)
Glucose-Capillary: 119 mg/dL — ABNORMAL HIGH (ref 65–99)

## 2015-11-14 LAB — D-DIMER, QUANTITATIVE (NOT AT ARMC): D DIMER QUANT: 1.25 ug{FEU}/mL — AB (ref 0.00–0.50)

## 2015-11-14 LAB — PROCALCITONIN: PROCALCITONIN: 0.71 ng/mL

## 2015-11-14 MED ORDER — ACETAMINOPHEN 500 MG PO TABS: 500.0000 mg | ORAL_TABLET | Freq: Four times a day (QID) | ORAL | Status: DC | PRN

## 2015-11-14 MED ORDER — METOPROLOL TARTRATE 50 MG PO TABS
50.0000 mg | ORAL_TABLET | Freq: Two times a day (BID) | ORAL | Status: DC
  Administered 2015-11-14 – 2015-11-15 (×3): 50 mg via ORAL
  Filled 2015-11-14 (×3): qty 1

## 2015-11-14 MED ORDER — BUTALBITAL-APAP-CAFFEINE 50-325-40 MG PO TABS: 1.0000 | ORAL_TABLET | ORAL | Status: DC | PRN

## 2015-11-14 MED ORDER — METOPROLOL TARTRATE 25 MG PO TABS
25.0000 mg | ORAL_TABLET | Freq: Two times a day (BID) | ORAL | Status: DC
  Filled 2015-11-14: qty 1

## 2015-11-14 MED ORDER — ACETAMINOPHEN 650 MG RE SUPP: 325.0000 mg | Freq: Four times a day (QID) | RECTAL | Status: DC | PRN

## 2015-11-14 NOTE — Progress Notes (Addendum)
[place] TEAM 1 - Stepdown/ICU TEAM PROGRESS NOTE  Brent Taylor [id number] DOB: [birth year] DOA: 11/09/2015 PCP: [name], MD  Admit HPI / Brief Narrative: 75 y.o. male w/ a history of hypertension, hyperlipidemia, and melanoma who presented to the ER because of fever and chills. Denied chest pain shortness of breath productive cough nausea vomiting diarrhea. Had some symptoms of sore throat.   In the ER patient was found to be hypotensive and febrile. Blood cultures were obtained along with urine cultures. Chest x-ray did not show anything acute.  Patient denied recent sick contacts travel insect bites or any change in medications.   HPI/Subjective:   Assessment/Plan:  Sepsis of unclear etiology  the exact etiology of his initial sepsis remains unclear - thus far the only significant finding is that of a carious tooth and another tooth with a periapical abscess - neither of these seem impressive enough to lead to the impressive sepsis he had at his presentation - nonetheless he is improving - narrowed abx to rocephin alone - cont to follow (MRSA screen negative)  Abscessed tooth - dental carries  As discussed above I doubt that this dental infection is severe enough to lead to the extent of sepsis he was suffering with - continue antibiotics and follow - still no jaw pain or even difficulty chewing at this time   Severe neck pain No significant findings on physical exam - no focal neurologic deficits - MRI of cervical spine w/o evidence of infection - pain has now completely resolved   Sinus tachycardia Etiology unclear - tachy at admit felt to be due to hypotension/sepsis - tachy recurred yesterday - no evidence of Afib on EKG or auscultation - check d-Dimer - TSH was normal 1/29 - is not on BB at home - cortisol has been appropriately elevated during this admit - ?due to hydralazine (stopped)  Combined mild systolic and diastolic CHF  Newly appreciated on TTE this  admit - no evidence of volume overload - ?sepsis related - will need f/u TTE as outpt - no ACE/ARB for now due to renal insuff - BB being dosed   Hypertension Patient was markedly hypotensive initially - blood pressure is now elevated, and pt is no longer in pain - increase BB today and follow trend   Hyperlipidemia Resume tx if good intake persists   Chronic kidney disease stage 2-3 Creatinine has steadily improved with volume resuscitation and has now essentially normalized   DM2 CBG well controlled at present - holding Glucophage with very poor intake  Code Status: FULL Family Communication: no family present at time of exam today  Disposition Plan: SDU due to persisting tachycardia of unclear etiology   Consultants: none  Procedures: TTE - 2/1 - EF 40-45% - grade 1 DD  Antibiotics: Zosyn 1/29 > 1/31 Vanc 1/29 > 2/1 Roxephin 1/31 >  DVT prophylaxis: Lovenox  Objective: Blood pressure 134/94, pulse 115, temperature 98.3 F (36.8 C), temperature source Oral, resp. rate 19, height 6' (1.829 m), weight 112.084 kg (247 lb 1.6 oz), SpO2 95 %.  Intake/Output Summary (Last 24 hours) at 11/14/15 0943 Last data filed at 11/14/15 0855  Gross per 24 hour  Intake    630 ml  Output   1925 ml  Net  -1295 ml   Exam: General: No acute respiratory distress - alert and oriented - pleasant  Lungs: Clear to auscultation bilaterally - no wheezes or crackles Cardiovascular: Tachycardic but regular without murmur gallop or rub  Abdomen: Nontender,  over weight, soft, bowel sounds positive, no rebound, no ascites, no appreciable mass Extremities: No significant cyanosis, clubbing, or edema bilateral lower extremities Neuro:  Alert and oriented - no focal neuro deficits - no photophobia  Data Reviewed: Basic Metabolic Panel:  Recent Labs Lab 11/09/15 2215 11/09/15 2236 11/10/15 0323 11/11/15 0252 11/13/15 0518  NA 141 141 140 138 138  K 3.5 3.4* 3.7 3.8 3.9  CL 101 100* 108 106  104  CO2 26  --  22 22 24   GLUCOSE 151* 149* 160* 128* 134*  BUN 27* 30* 26* 18 12  CREATININE 1.73* 1.60* 1.73* 1.46* 1.23  CALCIUM 9.8  --  7.9* 7.9* 8.8*    CBC:  Recent Labs Lab 11/09/15 2215 11/09/15 2236 11/10/15 0323 11/11/15 0252 11/13/15 0518  WBC 7.5  --  12.8* 9.5 11.0*  NEUTROABS  --   --  12.1*  --   --   HGB 16.6 17.7* 13.3 11.8* 14.0  HCT 50.4 52.0 41.1 37.1* 42.0  MCV 88.1  --  88.2 87.7 86.8  PLT 193  --  164 171 187    Liver Function Tests:  Recent Labs Lab 11/10/15 0323 11/11/15 0252 11/13/15 0518  AST 47* 22 14*  ALT 44 34 22  ALKPHOS 36* 35* 38  BILITOT 0.7 0.7 0.7  PROT 5.2* 5.1* 6.2*  ALBUMIN 2.8* 2.7* 2.9*    Coags:  Recent Labs Lab 11/10/15 0323  INR 1.26    Recent Labs Lab 11/10/15 0323  APTT 26    Cardiac Enzymes:  Recent Labs Lab 11/09/15 2215 11/10/15 0323  CKTOTAL 40*  --   TROPONINI  --  <0.03    CBG:  Recent Labs Lab 11/13/15 0820 11/13/15 1224 11/13/15 1747 11/13/15 2145 11/14/15 0819  GLUCAP 114* 126* 127* 125* 129*    Recent Results (from the past 240 hour(s))  Blood culture (routine x 2)     Status: None (Preliminary result)   Collection Time: 11/09/15 10:35 PM  Result Value Ref Range Status   Specimen Description BLOOD LEFT ARM  Final   Special Requests BOTTLES DRAWN AEROBIC AND ANAEROBIC 5CC   Final   Culture NO GROWTH 4 DAYS  Final   Report Status PENDING  Incomplete  Blood culture (routine x 2)     Status: None (Preliminary result)   Collection Time: 11/09/15 10:40 PM  Result Value Ref Range Status   Specimen Description BLOOD LEFT HAND  Final   Special Requests BOTTLES DRAWN AEROBIC AND ANAEROBIC 5CC   Final   Culture NO GROWTH 4 DAYS  Final   Report Status PENDING  Incomplete  Urine culture     Status: None   Collection Time: 11/09/15 11:15 PM  Result Value Ref Range Status   Specimen Description URINE, CLEAN CATCH  Final   Special Requests NONE  Final   Culture MULTIPLE SPECIES  PRESENT, SUGGEST RECOLLECTION  Final   Report Status 11/11/2015 FINAL  Final  MRSA PCR Screening     Status: None   Collection Time: 11/10/15  1:43 AM  Result Value Ref Range Status   MRSA by PCR NEGATIVE NEGATIVE Final    Comment:        The GeneXpert MRSA Assay (FDA approved for NASAL specimens only), is one component of a comprehensive MRSA colonization surveillance program. It is not intended to diagnose MRSA infection nor to guide or monitor treatment for MRSA infections.   Culture, Urine     Status: None  Collection Time: 11/11/15  5:00 PM  Result Value Ref Range Status   Specimen Description URINE, CLEAN CATCH  Final   Special Requests NONE  Final   Culture NO GROWTH 1 DAY  Final   Report Status 11/12/2015 FINAL  Final     Studies:   Recent x-ray studies have been reviewed in detail by the Attending Physician  Scheduled Meds:  Scheduled Meds: . amLODipine  10 mg Oral Daily  . cefTRIAXone (ROCEPHIN)  IV  2 g Intravenous Q12H  . clopidogrel  75 mg Oral Daily  . enoxaparin (LOVENOX) injection  40 mg Subcutaneous Q24H  . hydrALAZINE  5 mg Intravenous Q6H  . insulin aspart  0-9 Units Subcutaneous TID WC  . metoprolol tartrate  25 mg Oral BID  . pantoprazole  40 mg Oral Q1200  . pravastatin  40 mg Oral q1800    Time spent on care of this patient: 35 mins   Helmi Hechavarria T , MD   [facility] Office  [phone] Pager - Text Page per Shea Evans as per below:  On-Call/Text Page:      [email or web address]      password [email or web address]  If 7PM-7AM, please contact night-coverage [email or web address] Password [email or web address] 11/14/2015, 9:43 AM   LOS: 4 days

## 2015-11-14 NOTE — Progress Notes (Signed)
Pharmacy Antibiotic Note  Brent Taylor is a 75 y.o. male admitted on 11/09/2015 with possible meningitis.  Pharmacy has been consulted for ceftriaxone dosing as patient with continued headache that is not improving. Also with severe neck stiffness / pain. Now day #5 of abx. Afebrile, WBC at 11.0  Plan: Continue Ceftriaxone 2g IV q12 (Decrease dose if meningitis ruled out) Monitor clinical picture F/U C&S, abx deescalation / LOT  Height: 6' (182.9 cm) Weight: 247 lb 1.6 oz (112.084 kg) IBW/kg (Calculated) : 77.6  Temp (24hrs), Avg:98.5 F (36.9 C), Min:97.6 F (36.4 C), Max:99.2 F (37.3 C)   Recent Labs Lab 11/09/15 2215  11/09/15 2236 11/09/15 2343 11/10/15 0123 11/10/15 0323 11/10/15 1353 11/10/15 1920 11/11/15 0252 11/13/15 0518  WBC 7.5  --   --   --   --  12.8*  --   --  9.5 11.0*  CREATININE 1.73*  --  1.60*  --   --  1.73*  --   --  1.46* 1.23  LATICACIDVEN  --   < > 4.57* 2.93* 3.87* 3.3* 1.6 1.6  --   --   < > = values in this interval not displayed.  Estimated Creatinine Clearance: 65 mL/min (by C-G formula based on Cr of 1.23).    Allergies  Allergen Reactions  . Lipitor [Atorvastatin Calcium]     Muscle ache    Antimicrobials this admission: Ceftriaxone 1/31>> Vanc 1/29 >> 2/1 Zosyn 1/29 >> 2/1  Dose adjustments this admission: N/A  Microbiology results: 1/31 urine: ngF 1/30 MRSA PCR neg 1/29 BCx2: ngtd 1/29 flu panel neg  Thank you for allowing pharmacy to be a part of this patient's care.  Elenor Quinones, PharmD, BCPS Clinical Pharmacist Pager [phone] 11/14/2015 12:34 PM

## 2015-11-15 LAB — COMPREHENSIVE METABOLIC PANEL
ALBUMIN: 2.6 g/dL — AB (ref 3.5–5.0)
ALK PHOS: 37 U/L — AB (ref 38–126)
ALT: 19 U/L (ref 17–63)
AST: 18 U/L (ref 15–41)
Anion gap: 12 (ref 5–15)
BILIRUBIN TOTAL: 0.8 mg/dL (ref 0.3–1.2)
BUN: 19 mg/dL (ref 6–20)
CALCIUM: 8.8 mg/dL — AB (ref 8.9–10.3)
CHLORIDE: 104 mmol/L (ref 101–111)
CO2: 23 mmol/L (ref 22–32)
CREATININE: 1.13 mg/dL (ref 0.61–1.24)
Glucose, Bld: 125 mg/dL — ABNORMAL HIGH (ref 65–99)
Potassium: 4 mmol/L (ref 3.5–5.1)
SODIUM: 139 mmol/L (ref 135–145)
Total Protein: 6.2 g/dL — ABNORMAL LOW (ref 6.5–8.1)

## 2015-11-15 LAB — CBC
HEMATOCRIT: 41.1 % (ref 39.0–52.0)
Hemoglobin: 13.5 g/dL (ref 13.0–17.0)
MCH: 28.7 pg (ref 26.0–34.0)
MCHC: 32.8 g/dL (ref 30.0–36.0)
MCV: 87.3 fL (ref 78.0–100.0)
Platelets: 236 10*3/uL (ref 150–400)
RBC: 4.71 MIL/uL (ref 4.22–5.81)
RDW: 15.3 % (ref 11.5–15.5)
WBC: 7.2 10*3/uL (ref 4.0–10.5)

## 2015-11-15 LAB — GLUCOSE, CAPILLARY
GLUCOSE-CAPILLARY: 94 mg/dL (ref 65–99)
GLUCOSE-CAPILLARY: 99 mg/dL (ref 65–99)
Glucose-Capillary: 117 mg/dL — ABNORMAL HIGH (ref 65–99)
Glucose-Capillary: 144 mg/dL — ABNORMAL HIGH (ref 65–99)

## 2015-11-15 MED ORDER — LISINOPRIL 20 MG PO TABS
10.0000 mg | ORAL_TABLET | Freq: Every day | ORAL | Status: DC
  Administered 2015-11-15: 10 mg via ORAL
  Filled 2015-11-15: qty 1

## 2015-11-15 MED ORDER — METOPROLOL TARTRATE 25 MG PO TABS
25.0000 mg | ORAL_TABLET | Freq: Two times a day (BID) | ORAL | Status: DC
  Administered 2015-11-15 – 2015-11-16 (×2): 25 mg via ORAL
  Filled 2015-11-15 (×2): qty 1

## 2015-11-15 MED ORDER — QUINAPRIL HCL 10 MG PO TABS: 10.0000 mg | ORAL_TABLET | Freq: Every day | ORAL | Status: DC

## 2015-11-15 NOTE — Progress Notes (Signed)
TEAM 1 - Stepdown/ICU TEAM PROGRESS NOTE  [name] [id number] DOB: 1941-05-22 DOA: 11/09/2015 PCP: [name], MD  Admit HPI / Brief Narrative: 75 y.o. male w/ a history of hypertension, hyperlipidemia, and melanoma who presented to the ER because of fever and chills. Denied chest pain shortness of breath productive cough nausea vomiting diarrhea. Had some symptoms of sore throat.   In the ER patient was found to be hypotensive and febrile. Blood cultures were obtained along with urine cultures. Chest x-ray did not show anything acute.  Patient denied recent sick contacts travel insect bites or any change in medications.   HPI/Subjective: The patient is sitting up in a bedside chair.  He reports no neck pain whatsoever.  He denies headache photophobia chest pain shortness breath fevers chills nausea or vomiting.  He is inquiring as to when he will be allowed to go home.  Assessment/Plan:  Sepsis of unclear etiology  the exact etiology of his initial sepsis remains unclear - thus far the only significant finding is that of a R carious tooth and another tooth with a periapical abscess on the L (#19) - neither of these seem impressive enough to lead to the impressive sepsis he had at his presentation - nonetheless he is improving - has completed 7 days of abx today - stop and follow over night   Abscessed tooth - dental carries  As discussed above I doubt that this dental infection is severe enough to lead to the extent of sepsis he was suffering with, but  we have found absolutely no evidence of infection elsewhere and therefore will assume that it was the cause - still no jaw pain or even difficulty chewing at this time - patient informs me that he has an appointment to have the tooth extracted this coming Tuesday    Severe neck pain No significant findings on physical exam - no focal neurologic deficits - MRI of cervical spine w/o evidence of infection - pain has  now completely resolved   Sinus tachycardia Etiology unclear - tachy at admit felt to be due to hypotension/sepsis - tachy recurred 2/2 coincident w/ initiation of hydralazine - no evidence of Afib on EKG or auscultation - SH was normal 1/29 - is not on BB at home - cortisol has been appropriately elevated during this admit - much improved w/ d/c of hydralazine   Combined mild systolic and diastolic CHF  Newly appreciated on TTE this admit - no evidence of volume overload - ?sepsis related - will need f/u TTE as outpt - no ACE/ARB for now due to renal insuff - BB being dosed   Hypertension Patient was markedly hypotensive initially - blood pressure is now  well controlled   Hyperlipidemia Resume tx if good intake persists   Chronic kidney disease stage 2-3 Creatinine has steadily improved with volume resuscitation and has now essentially normalized   DM2 CBG well controlled at present - plan to resume Glucophage at time of discharge   Code Status: FULL Family Communication: spoke with wife and daughter at bedside at length   Disposition Plan:  plan for discharge home in a.m. therefore will not transfer to different bed today   Consultants: none  Procedures: TTE - 2/1 - EF 40-45% - grade 1 DD  Antibiotics: Zosyn 1/29 > 1/31 Vanc 1/29 > 2/1 Roxephin 1/31 > 2/4  DVT prophylaxis: Lovenox  Objective: Blood pressure 111/82, pulse 90, temperature 98.3 F (36.8 C), temperature source Oral, resp. rate 18,  height 6' (1.829 m), weight 110.542 kg (243 lb 11.2 oz), SpO2 93 %.  Intake/Output Summary (Last 24 hours) at 11/15/15 1610 Last data filed at 11/15/15 1400  Gross per 24 hour  Intake    410 ml  Output    955 ml  Net   -545 ml   Exam: General: No acute respiratory distress - alert and oriented Lungs: Clear to auscultation bilaterally - no crackles Cardiovascular: RRR w/o M, G, R Abdomen: Nontender, overweight, soft, bowel sounds positive, no rebound, no ascites, no  appreciable mass Extremities: No significant cyanosis, clubbing, or edema bilateral lower extremities  Data Reviewed: Basic Metabolic Panel:  Recent Labs Lab 11/09/15 2215 11/09/15 2236 11/10/15 0323 11/11/15 0252 11/13/15 0518 11/15/15 0337  NA 141 141 140 138 138 139  K 3.5 3.4* 3.7 3.8 3.9 4.0  CL 101 100* 108 106 104 104  CO2 26  --  22 22 24 23   GLUCOSE 151* 149* 160* 128* 134* 125*  BUN 27* 30* 26* 18 12 19   CREATININE 1.73* 1.60* 1.73* 1.46* 1.23 1.13  CALCIUM 9.8  --  7.9* 7.9* 8.8* 8.8*    CBC:  Recent Labs Lab 11/09/15 2215 11/09/15 2236 11/10/15 0323 11/11/15 0252 11/13/15 0518 11/15/15 0337  WBC 7.5  --  12.8* 9.5 11.0* 7.2  NEUTROABS  --   --  12.1*  --   --   --   HGB 16.6 17.7* 13.3 11.8* 14.0 13.5  HCT 50.4 52.0 41.1 37.1* 42.0 41.1  MCV 88.1  --  88.2 87.7 86.8 87.3  PLT 193  --  164 171 187 236    Liver Function Tests:  Recent Labs Lab 11/10/15 0323 11/11/15 0252 11/13/15 0518 11/15/15 0337  AST 47* 22 14* 18  ALT 44 34 22 19  ALKPHOS 36* 35* 38 37*  BILITOT 0.7 0.7 0.7 0.8  PROT 5.2* 5.1* 6.2* 6.2*  ALBUMIN 2.8* 2.7* 2.9* 2.6*    Coags:  Recent Labs Lab 11/10/15 0323  INR 1.26    Recent Labs Lab 11/10/15 0323  APTT 26    Cardiac Enzymes:  Recent Labs Lab 11/09/15 2215 11/10/15 0323  CKTOTAL 40*  --   TROPONINI  --  <0.03    CBG:  Recent Labs Lab 11/14/15 1256 11/14/15 1623 11/14/15 2155 11/15/15 0839 11/15/15 1308  GLUCAP 121* 119* 127* 117* 94    Recent Results (from the past 240 hour(s))  Blood culture (routine x 2)     Status: None   Collection Time: 11/09/15 10:35 PM  Result Value Ref Range Status   Specimen Description BLOOD LEFT ARM  Final   Special Requests BOTTLES DRAWN AEROBIC AND ANAEROBIC 5CC   Final   Culture NO GROWTH 5 DAYS  Final   Report Status 11/14/2015 FINAL  Final  Blood culture (routine x 2)     Status: None   Collection Time: 11/09/15 10:40 PM  Result Value Ref Range Status    Specimen Description BLOOD LEFT HAND  Final   Special Requests BOTTLES DRAWN AEROBIC AND ANAEROBIC 5CC   Final   Culture NO GROWTH 5 DAYS  Final   Report Status 11/14/2015 FINAL  Final  Urine culture     Status: None   Collection Time: 11/09/15 11:15 PM  Result Value Ref Range Status   Specimen Description URINE, CLEAN CATCH  Final   Special Requests NONE  Final   Culture MULTIPLE SPECIES PRESENT, SUGGEST RECOLLECTION  Final   Report Status 11/11/2015 FINAL  Final  MRSA PCR Screening     Status: None   Collection Time: 11/10/15  1:43 AM  Result Value Ref Range Status   MRSA by PCR NEGATIVE NEGATIVE Final    Comment:        The GeneXpert MRSA Assay (FDA approved for NASAL specimens only), is one component of a comprehensive MRSA colonization surveillance program. It is not intended to diagnose MRSA infection nor to guide or monitor treatment for MRSA infections.   Culture, Urine     Status: None   Collection Time: 11/11/15  5:00 PM  Result Value Ref Range Status   Specimen Description URINE, CLEAN CATCH  Final   Special Requests NONE  Final   Culture NO GROWTH 1 DAY  Final   Report Status 11/12/2015 FINAL  Final     Studies:   Recent x-ray studies have been reviewed in detail by the Attending Physician  Scheduled Meds:  Scheduled Meds: . amLODipine  10 mg Oral Daily  . cefTRIAXone (ROCEPHIN)  IV  2 g Intravenous Q12H  . clopidogrel  75 mg Oral Daily  . enoxaparin (LOVENOX) injection  40 mg Subcutaneous Q24H  . insulin aspart  0-9 Units Subcutaneous TID WC  . metoprolol tartrate  50 mg Oral BID  . pantoprazole  40 mg Oral Q1200  . pravastatin  40 mg Oral q1800    Time spent on care of this patient: 35 mins   MCCLUNG,JEFFREY T , MD   [facility] Office  [phone] Pager - Text Page per Shea Evans as per below:  On-Call/Text Page:      [email or web address]      password [email or web address]  If 7PM-7AM, please contact night-coverage [email or web address] Password [email or web address] 11/15/2015,  4:10 PM   LOS: 5 days

## 2015-11-16 DIAGNOSIS — R509 Fever, unspecified: Secondary | ICD-10-CM | POA: Insufficient documentation

## 2015-11-16 LAB — CBC
HCT: 42.2 % (ref 39.0–52.0)
Hemoglobin: 13.5 g/dL (ref 13.0–17.0)
MCH: 28 pg (ref 26.0–34.0)
MCHC: 32 g/dL (ref 30.0–36.0)
MCV: 87.6 fL (ref 78.0–100.0)
PLATELETS: 265 10*3/uL (ref 150–400)
RBC: 4.82 MIL/uL (ref 4.22–5.81)
RDW: 15.2 % (ref 11.5–15.5)
WBC: 6.4 10*3/uL (ref 4.0–10.5)

## 2015-11-16 LAB — GLUCOSE, CAPILLARY
GLUCOSE-CAPILLARY: 110 mg/dL — AB (ref 65–99)
Glucose-Capillary: 97 mg/dL (ref 65–99)

## 2015-11-16 MED ORDER — CEFUROXIME AXETIL 500 MG PO TABS
500.0000 mg | ORAL_TABLET | Freq: Two times a day (BID) | ORAL | Status: DC
  Filled 2015-11-16: qty 1

## 2015-11-16 MED ORDER — SACCHAROMYCES BOULARDII 250 MG PO CAPS: 250.0000 mg | ORAL_CAPSULE | Freq: Two times a day (BID) | ORAL | Status: DC

## 2015-11-16 MED ORDER — AMLODIPINE BESYLATE 10 MG PO TABS: 10.0000 mg | ORAL_TABLET | Freq: Every day | ORAL | Status: DC

## 2015-11-16 MED ORDER — METOPROLOL TARTRATE 25 MG PO TABS: 25.0000 mg | ORAL_TABLET | Freq: Two times a day (BID) | ORAL | Status: DC

## 2015-11-16 MED ORDER — CEFUROXIME AXETIL 500 MG PO TABS: 500.0000 mg | ORAL_TABLET | Freq: Two times a day (BID) | ORAL | Status: DC

## 2015-11-16 MED ORDER — RISAQUAD PO CAPS: 1.0000 | ORAL_CAPSULE | Freq: Every day | ORAL | Status: DC

## 2015-11-16 MED ORDER — TRAMADOL HCL 50 MG PO TABS
50.0000 mg | ORAL_TABLET | Freq: Four times a day (QID) | ORAL | Status: DC | PRN
Start: 2015-11-16 — End: 2015-12-20

## 2015-11-16 NOTE — Discharge Summary (Signed)
DISCHARGE SUMMARY  Brent Taylor  MR#: [id number]  DOB:January 06, 1941  Date of Admission: 11/09/2015 Date of Discharge: 11/16/2015  Attending Physician:MCCLUNG,JEFFREY T  Patient's MY:[id number] Brent Plate, MD  Consults: none  Disposition: D/C home   Follow-up Appts:     Follow-up Information    Go to Dr. Herbie [facility].   Why:  Appointment: November 18, 2015 @ 2pm. Tooth abcess causing sepsis.   Contact information:   [address] [place], [address] [phone]      Follow up with Brent Cowden, MD. Schedule an appointment as soon as possible for a visit in 1 week.   Specialty:  Internal Medicine   Contact information:   [place] [address] [phone]      Tests Needing Follow-up: -recheck of BP and HR will be indicated in 7-10 days -recheck of TTE to reassess mild systolic CHF will be indicated in 2-3 months   Discharge Diagnoses: Sepsis of unclear etiology  Abscessed tooth - dental carries  Severe neck pain Sinus tachycardia Combined mild systolic and diastolic CHF  Hypertension Hyperlipidemia Chronic kidney disease stage 2-3 DM2  Initial presentation: 75 y.o. male w/ a history of hypertension, hyperlipidemia, and melanoma who presented to the ER because of fever and chills. Denied chest pain shortness of breath productive cough nausea vomiting diarrhea. Had some symptoms of sore throat.   In the ER patient was found to be hypotensive and febrile. Blood cultures were obtained along with urine cultures. Chest x-ray did not show anything acute. Patient denied recent sick contacts travel insect bites or any change in medications.   [facility] Course:  Sepsis of unclear etiology  the exact etiology of his initial sepsis remains unclear - the only significant finding was that of a R carious tooth and another tooth with a periapical abscess on the L (#19) - neither of these seemed impressive enough to lead to the  severe sepsis he had at his presentation - nonetheless he improved w/ empiric abx tx - completed 7 days of abx 11/15/15   Abscessed tooth - dental carries  As discussed above I doubted that this dental infection was severe enough to lead to the extent of sepsis he was suffering with, butwe have found absolutely no evidence of infection elsewhere and therefore assumed that it was the cause - still no jaw pain or even difficulty chewing at time of d/c - patient informed me that he has an appointment to have the tooth extracted 11/18/15 - he is provided w/ a Rx for 5 days of ceftin at d/c, and instructed to call his dentist on 2/6 to confirm his appointment for extraction ion 2/7 - if the appointment is confirmed, he will not need the abx - if the appointment is delayed for any reason, he is instructed to start the ceftin on 2/6   Severe neck pain No significant findings on physical exam - no focal neurologic deficits - MRI of cervical spine w/o evidence of infection - pain completely resolved at time of d/c   Sinus tachycardia Etiology unclear - tachy at admit felt to be due to hypotension/sepsis - tachy recurred 2/2 coincident w/ initiation of hydralazine - no evidence of Afib on EKG or auscultation - TSH was normal 1/29 - is not on BB at home - cortisol was appropriately elevated during this admit - much improved w/ d/c of hydralazine - d/c home on metoprolol but may not require long term - to f/u w/ his PCP  for BP and HR check in 7 days   Combined mild systolic and diastolic CHF - newly appreciated  Newly appreciated on TTE this admit - no evidence of volume overload - ?sepsis related - will need f/u TTE as outpt - discussed w/ pt - resume home does of ACE at d/c - BB added as well - avoid diuretic for now as pt came in severely Brent Taylor and hypotensive   Hypertension Patient was markedly hypotensive initially - blood pressure is now controlled - discussed changes in medication w/ pt prior to d/c - may not  need BB or Norvasc long term depending upon f/u TTE and HR issues   Hyperlipidemia Resume tx at time of d/c   Chronic kidney disease stage 2-3 Creatinine has steadily improved with volume resuscitation and has now essentially normalized   DM2 CBG well controlled at present - resume Glucophage at time of discharge      Medication List    STOP taking these medications        hydrochlorothiazide 25 MG tablet  Commonly known as:  HYDRODIURIL     Na Sulfate-K Sulfate-Mg Sulf Soln      TAKE these medications        amLODipine 10 MG tablet  Commonly known as:  NORVASC  Take 1 tablet (10 mg total) by mouth daily.     cefUROXime 500 MG tablet  Commonly known as:  CEFTIN  Take 1 tablet (500 mg total) by mouth 2 (two) times daily with a meal.  Start taking on:  11/17/2015     clopidogrel 75 MG tablet  Commonly known as:  PLAVIX  TAKE ONE TABLET (75MG  TOTAL) BY MOUTH DAILY     ibuprofen 200 MG tablet  Commonly known as:  ADVIL,MOTRIN  Take 600 mg by mouth every 6 (six) hours as needed.     metFORMIN 500 MG tablet  Commonly known as:  GLUCOPHAGE  TAKE ONE TABLET (50OMG TOTAL) BY MOUTH DAILY WITH BREAKFAST     metoprolol tartrate 25 MG tablet  Commonly known as:  LOPRESSOR  Take 1 tablet (25 mg total) by mouth 2 (two) times daily.     NEXIUM 20 MG packet  Generic drug:  esomeprazole  Take 20 mg by mouth daily before breakfast. OTC     pravastatin 40 MG tablet  Commonly known as:  PRAVACHOL  TAKE 1 TABLET (40 MG TOTAL) BY MOUTH DAILY.     quinapril 40 MG tablet  Commonly known as:  ACCUPRIL  TAKE ONE TABLET (40MG  TOTAL)  BY MOUTH DAILY     saccharomyces boulardii 250 MG capsule  Commonly known as:  FLORASTOR  Take 1 capsule (250 mg total) by mouth 2 (two) times daily.  Start taking on:  11/17/2015     traMADol 50 MG tablet  Commonly known as:  ULTRAM  Take 1 tablet (50 mg total) by mouth every 6 (six) hours as needed for moderate pain.        Day of  Discharge BP 127/77 mmHg  Pulse 94  Temp(Src) 97.7 F (36.5 C) (Oral)  Resp 14  Ht 6' (1.829 m)  Wt 110.723 kg (244 lb 1.6 oz)  BMI 33.10 kg/m2  SpO2 95%  Physical Exam: General: No acute respiratory distress Lungs: Clear to auscultation bilaterally without wheezes or crackles Cardiovascular: Regular rate and rhythm without murmur gallop or rub normal S1 and S2 Abdomen: Nontender, nondistended, soft, bowel sounds positive, no rebound, no ascites, no appreciable mass Extremities: No  significant cyanosis, clubbing, or edema bilateral lower extremities  Basic Metabolic Panel:  Recent Labs Lab 11/09/15 2215 11/09/15 2236 11/10/15 0323 11/11/15 0252 11/13/15 0518 11/15/15 0337  NA 141 141 140 138 138 139  K 3.5 3.4* 3.7 3.8 3.9 4.0  CL 101 100* 108 106 104 104  CO2 26  --  22 22 24 23   GLUCOSE 151* 149* 160* 128* 134* 125*  BUN 27* 30* 26* 18 12 19   CREATININE 1.73* 1.60* 1.73* 1.46* 1.23 1.13  CALCIUM 9.8  --  7.9* 7.9* 8.8* 8.8*    Liver Function Tests:  Recent Labs Lab 11/10/15 0323 11/11/15 0252 11/13/15 0518 11/15/15 0337  AST 47* 22 14* 18  ALT 44 34 22 19  ALKPHOS 36* 35* 38 37*  BILITOT 0.7 0.7 0.7 0.8  PROT 5.2* 5.1* 6.2* 6.2*  ALBUMIN 2.8* 2.7* 2.9* 2.6*   Coags:  Recent Labs Lab 11/10/15 0323  INR 1.26   CBC:  Recent Labs Lab 11/10/15 0323 11/11/15 0252 11/13/15 0518 11/15/15 0337 11/16/15 0340  WBC 12.8* 9.5 11.0* 7.2 6.4  NEUTROABS 12.1*  --   --   --   --   HGB 13.3 11.8* 14.0 13.5 13.5  HCT 41.1 37.1* 42.0 41.1 42.2  MCV 88.2 87.7 86.8 87.3 87.6  PLT 164 171 187 236 265    Cardiac Enzymes:  Recent Labs Lab 11/09/15 2215 11/10/15 0323  CKTOTAL 40*  --   TROPONINI  --  <0.03    CBG:  Recent Labs Lab 11/14/15 2155 11/15/15 0839 11/15/15 1308 11/15/15 1800 11/15/15 2134  GLUCAP 127* 117* 94 99 144*    Recent Results (from the past 240 hour(s))  Blood culture (routine x 2)     Status: None   Collection Time:  11/09/15 10:35 PM  Result Value Ref Range Status   Specimen Description BLOOD LEFT ARM  Final   Special Requests BOTTLES DRAWN AEROBIC AND ANAEROBIC 5CC   Final   Culture NO GROWTH 5 DAYS  Final   Report Status 11/14/2015 FINAL  Final  Blood culture (routine x 2)     Status: None   Collection Time: 11/09/15 10:40 PM  Result Value Ref Range Status   Specimen Description BLOOD LEFT HAND  Final   Special Requests BOTTLES DRAWN AEROBIC AND ANAEROBIC 5CC   Final   Culture NO GROWTH 5 DAYS  Final   Report Status 11/14/2015 FINAL  Final  Urine culture     Status: None   Collection Time: 11/09/15 11:15 PM  Result Value Ref Range Status   Specimen Description URINE, CLEAN CATCH  Final   Special Requests NONE  Final   Culture MULTIPLE SPECIES PRESENT, SUGGEST RECOLLECTION  Final   Report Status 11/11/2015 FINAL  Final  MRSA PCR Screening     Status: None   Collection Time: 11/10/15  1:43 AM  Result Value Ref Range Status   MRSA by PCR NEGATIVE NEGATIVE Final    Comment:        The GeneXpert MRSA Assay (FDA approved for NASAL specimens only), is one component of a comprehensive MRSA colonization surveillance program. It is not intended to diagnose MRSA infection nor to guide or monitor treatment for MRSA infections.   Culture, Urine     Status: None   Collection Time: 11/11/15  5:00 PM  Result Value Ref Range Status   Specimen Description URINE, CLEAN CATCH  Final   Special Requests NONE  Final   Culture NO GROWTH 1  DAY  Final   Report Status 11/12/2015 FINAL  Final    Time spent in discharge (includes decision making & examination of pt): >35  minutes  11/16/2015, 11:29 AM   Cherene Altes, MD [facility] Office  [phone] Pager [phone]  On-Call/Text Page:      [email or web address]      password [facility]

## 2015-11-16 NOTE — Discharge Instructions (Signed)

## 2015-11-16 NOTE — Plan of Care (Signed)
Problem: Activity: Goal: Risk for activity intolerance will decrease Outcome: Progressing Patient ambulating independently in the room at this time.

## 2015-11-16 NOTE — Progress Notes (Signed)
Patient discharged to home with wife, VSS, all belongings returned. Patient in no signs of distress. Discharge summary including follow-up appointments reviewed with patient and wife, no questions from either at this time.

## 2015-11-17 LAB — ROCKY MTN SPOTTED FVR ABS PNL(IGG+IGM)
RMSF IGG: NEGATIVE
RMSF IgM: 0.12 index (ref 0.00–0.89)

## 2015-11-18 ENCOUNTER — Telehealth: Payer: Self-pay

## 2015-11-18 NOTE — Telephone Encounter (Signed)
Transition Care Management Follow-up Telephone Call  How have you been since you were released from the hospital? Feel better    Do you understand why you were in the hospital? yes   Do you understand the discharge instrcutions? yes  Items Reviewed:  Medications reviewed: yes  Allergies reviewed: yes  Dietary changes reviewed: yes  Referrals reviewed: yes   Functional Questionnaire:   Activities of Daily Living (ADLs):   He states they are independent in the following: ambulation, bathing and hygiene, feeding, continence, grooming, toileting and dressing States they require assistance with the following: None   Any transportation issues/concerns?: no   Any patient concerns? no   Confirmed importance and date/time of follow-up visits scheduled: yes   Confirmed with patient if condition begins to worsen call PCP or go to the ER.  Patient was given the Call-a-Nurse line [phone]: yes  Pt has an appt with his PCP on Thursday 11/20/15 at 11:30

## 2015-11-20 ENCOUNTER — Encounter: Payer: Self-pay | Admitting: Internal Medicine

## 2015-11-20 ENCOUNTER — Other Ambulatory Visit: Payer: Self-pay | Admitting: *Deleted

## 2015-11-20 ENCOUNTER — Ambulatory Visit (INDEPENDENT_AMBULATORY_CARE_PROVIDER_SITE_OTHER): Payer: Medicare Other | Admitting: Internal Medicine

## 2015-11-20 VITALS — BP 140/80 | HR 56 | Temp 98.3°F | Resp 20 | Ht 72.0 in | Wt 242.0 lb

## 2015-11-20 DIAGNOSIS — E119 Type 2 diabetes mellitus without complications: Secondary | ICD-10-CM

## 2015-11-20 DIAGNOSIS — I5041 Acute combined systolic (congestive) and diastolic (congestive) heart failure: Secondary | ICD-10-CM | POA: Diagnosis not present

## 2015-11-20 DIAGNOSIS — I10 Essential (primary) hypertension: Secondary | ICD-10-CM

## 2015-11-20 DIAGNOSIS — I152 Hypertension secondary to endocrine disorders: Secondary | ICD-10-CM | POA: Insufficient documentation

## 2015-11-20 DIAGNOSIS — E1159 Type 2 diabetes mellitus with other circulatory complications: Secondary | ICD-10-CM | POA: Diagnosis not present

## 2015-11-20 DIAGNOSIS — E785 Hyperlipidemia, unspecified: Secondary | ICD-10-CM

## 2015-11-20 DIAGNOSIS — R509 Fever, unspecified: Secondary | ICD-10-CM

## 2015-11-20 LAB — HEMOGLOBIN A1C: Hgb A1c MFr Bld: 6.5 % (ref 4.6–6.5)

## 2015-11-20 MED ORDER — AMLODIPINE BESYLATE 10 MG PO TABS: 10.0000 mg | ORAL_TABLET | Freq: Every day | ORAL | Status: DC

## 2015-11-20 MED ORDER — METOPROLOL TARTRATE 25 MG PO TABS: 25.0000 mg | ORAL_TABLET | Freq: Two times a day (BID) | ORAL | Status: DC

## 2015-11-20 MED ORDER — QUINAPRIL HCL 40 MG PO TABS: ORAL_TABLET | ORAL | Status: DC

## 2015-11-20 NOTE — Progress Notes (Signed)
Pre visit review using our clinic review tool, if applicable. No additional management support is needed unless otherwise documented below in the visit note. 

## 2015-11-20 NOTE — Patient Instructions (Signed)
Limit your sodium (Salt) intake  Please check your blood pressure on a regular basis.  If it is consistently greater than 150/90, please make an office appointment.  Return in 6 months for follow-up   

## 2015-11-20 NOTE — Progress Notes (Signed)
Subjective:    Patient ID: Brent Taylor, male    DOB: January 10, 1941, 75 y.o.   MRN: [id number]  HPI Date of Admission: 11/09/2015 Date of Discharge: 11/16/2015  Attending Physician:MCCLUNG,JEFFREY T  Patient's [id number] [name], MD  Consults: none  Disposition: D/C home   Follow-up Appts:     Follow-up Information    Go to Dr. Herbie Saxon.   Why: Appointment: November 18, 2015 @ 2pm. Tooth abcess causing sepsis.   Contact information:   [address] [place], [address] [phone]      Follow up with [name], MD. Schedule an appointment as soon as possible for a visit in 1 week.   Specialty: Internal Medicine   Contact information:   [place] [address] [phone]      Tests Needing Follow-up: -recheck of BP and HR will be indicated in 7-10 days -recheck of TTE to reassess mild systolic CHF will be indicated in 2-3 months   Discharge Diagnoses: Sepsis of unclear etiology  Abscessed tooth - dental carries  Severe neck pain Sinus tachycardia Combined mild systolic and diastolic CHF  Hypertension Hyperlipidemia Chronic kidney disease stage 2-3 DM2       Lab Results  Component Value Date   HGBA1C 6.3 04/30/2015   75 year old patient who is seen today following a recent hospital discharge.  He was discharged 5 days ago after evaluation and management of sepsis syndrome.  No definitive etiology determined.  Today he feels quite well Stable medical problems include coronary artery disease, diabetes, dyslipidemia and hypertension.  Diuretic therapy has been discontinued He has remained afebrile off antibiotics therapy.  He has been seen in follow-up by dental medicine for the possibility of a periapical abscess involving tooth 19, and 20.  He has no dental pain  Hospital records reviewed  Past Medical History  Diagnosis Date  . Cancer (HCC)     hx - melanoma  .  Depression     No need for therapy.  Improved  . Hyperlipidemia   . Hypertension   . UTI (lower urinary tract infection)   . Colon polyps   . Diverticula, colon   . Chicken pox   . Hemorrhoids     Social History   Social History  . Marital Status: Married    Spouse Name: N/A  . Number of Children: 2  . Years of Education: N/A   Occupational History  . Minister    Social History Main Topics  . Smoking status: Never Smoker   . Smokeless tobacco: Never Used  . Alcohol Use: No  . Drug Use: No  . Sexual Activity: Not on file   Other Topics Concern  . Not on file   Social History Narrative   Lives with wife.      Past Surgical History  Procedure Laterality Date  . Melanoma excision      LEFT CHEST  . Melanoma excision      Family History  Problem Relation Age of Onset  . Heart Problems Mother     Skipping    Allergies  Allergen Reactions  . Lipitor [Atorvastatin Calcium]     Muscle ache    Current Outpatient Prescriptions on File Prior to Visit  Medication Sig Dispense Refill  . clopidogrel (PLAVIX) 75 MG tablet TAKE ONE TABLET (75MG  TOTAL) BY MOUTH DAILY 90 tablet 1  . esomeprazole (NEXIUM) 20 MG packet Take 20 mg by mouth daily before breakfast. OTC    .  ibuprofen (ADVIL,MOTRIN) 200 MG tablet Take 600 mg by mouth every 6 (six) hours as needed.    . metFORMIN (GLUCOPHAGE) 500 MG tablet TAKE ONE TABLET (50OMG TOTAL) BY MOUTH DAILY WITH BREAKFAST 30 tablet 5  . pravastatin (PRAVACHOL) 40 MG tablet TAKE 1 TABLET (40 MG TOTAL) BY MOUTH DAILY. 90 tablet 1  . traMADol (ULTRAM) 50 MG tablet Take 1 tablet (50 mg total) by mouth every 6 (six) hours as needed for moderate pain. (Patient not taking: Reported on 11/20/2015) 20 tablet 0   No current facility-administered medications on file prior to visit.    BP 140/80 mmHg  Pulse 56  Temp(Src) 98.3 F (36.8 C) (Oral)  Resp 20  Ht 6' (1.829 m)  Wt 242 lb (109.77 kg)  BMI 32.81 kg/m2  SpO2 95%     Review of  Systems  Constitutional: Negative for fever, chills, appetite change and fatigue.  HENT: Negative for congestion, dental problem, ear pain, hearing loss, sore throat, tinnitus, trouble swallowing and voice change.   Eyes: Negative for pain, discharge and visual disturbance.  Respiratory: Negative for cough, chest tightness, wheezing and stridor.   Cardiovascular: Negative for chest pain, palpitations and leg swelling.  Gastrointestinal: Negative for nausea, vomiting, abdominal pain, diarrhea, constipation, blood in stool and abdominal distention.  Genitourinary: Negative for urgency, hematuria, flank pain, discharge, difficulty urinating and genital sores.  Musculoskeletal: Negative for myalgias, back pain, joint swelling, arthralgias, gait problem and neck stiffness.  Skin: Negative for rash.  Neurological: Negative for dizziness, syncope, speech difficulty, weakness, numbness and headaches.  Hematological: Negative for adenopathy. Does not bruise/bleed easily.  Psychiatric/Behavioral: Negative for behavioral problems and dysphoric mood. The patient is not nervous/anxious.        Objective:   Physical Exam  Constitutional: He is oriented to person, place, and time. He appears well-developed.  HENT:  Head: Normocephalic.  Right Ear: External ear normal.  Left Ear: External ear normal.  Eyes: Conjunctivae and EOM are normal.  Neck: Normal range of motion.  Cardiovascular: Normal rate and normal heart sounds.   Pulmonary/Chest: Breath sounds normal.  Abdominal: Bowel sounds are normal.  Musculoskeletal: Normal range of motion. He exhibits no edema or tenderness.  Neurological: He is alert and oriented to person, place, and time.  Psychiatric: He has a normal mood and affect. His behavior is normal.          Assessment & Plan:   Status post sepsis syndrome, resolved Hypertension, well-controlled.  No change in therapy Diabetes mellitus.  Check hemoglobin A1c Dyslipidemia.   Continue statin therapy Coronary artery disease.  Continue aggressive risk factor modification.  Cardiology follow-up as scheduled  Return here 6 months or as needed

## 2015-12-05 ENCOUNTER — Encounter: Payer: Medicare Other | Admitting: Internal Medicine

## 2015-12-08 ENCOUNTER — Telehealth: Payer: Self-pay

## 2015-12-08 NOTE — Telephone Encounter (Signed)
Lm regarding Plavix

## 2015-12-12 NOTE — Telephone Encounter (Signed)
Patient already instructed regarding Plavix

## 2015-12-18 DIAGNOSIS — Z0100 Encounter for examination of eyes and vision without abnormal findings: Secondary | ICD-10-CM | POA: Diagnosis not present

## 2015-12-18 DIAGNOSIS — E119 Type 2 diabetes mellitus without complications: Secondary | ICD-10-CM | POA: Diagnosis not present

## 2015-12-18 LAB — HM DIABETES EYE EXAM

## 2015-12-19 ENCOUNTER — Encounter: Payer: Self-pay | Admitting: Internal Medicine

## 2015-12-20 ENCOUNTER — Telehealth: Payer: Self-pay | Admitting: Family Medicine

## 2015-12-20 ENCOUNTER — Encounter: Payer: Self-pay | Admitting: Physician Assistant

## 2015-12-20 ENCOUNTER — Ambulatory Visit (INDEPENDENT_AMBULATORY_CARE_PROVIDER_SITE_OTHER): Payer: Medicare Other | Admitting: Physician Assistant

## 2015-12-20 VITALS — BP 118/74 | HR 58 | Temp 98.4°F | Resp 18 | Ht 72.0 in | Wt 243.0 lb

## 2015-12-20 DIAGNOSIS — I2510 Atherosclerotic heart disease of native coronary artery without angina pectoris: Secondary | ICD-10-CM | POA: Diagnosis not present

## 2015-12-20 DIAGNOSIS — R399 Unspecified symptoms and signs involving the genitourinary system: Secondary | ICD-10-CM | POA: Diagnosis not present

## 2015-12-20 DIAGNOSIS — R300 Dysuria: Secondary | ICD-10-CM | POA: Diagnosis not present

## 2015-12-20 LAB — POCT URINALYSIS DIPSTICK
Bilirubin, UA: NEGATIVE
GLUCOSE UA: NEGATIVE
KETONES UA: NEGATIVE
Nitrite, UA: POSITIVE
Protein, UA: NEGATIVE
Urobilinogen, UA: 0.2
pH, UA: 6

## 2015-12-20 MED ORDER — SULFAMETHOXAZOLE-TRIMETHOPRIM 800-160 MG PO TABS: 1.0000 | ORAL_TABLET | Freq: Two times a day (BID) | ORAL | Status: DC

## 2015-12-20 NOTE — Progress Notes (Signed)
Patient presents to clinic today c/o 1 day of dysuria. Denies urinary urgency, frequency or hematuria. Denies nausea/vomiting or back pain. Endorses history of UTI. Is sexually active with his wife of many years. Denies concern for STI.  Past Medical History  Diagnosis Date  . Cancer (HCC)     hx - melanoma  . Depression     No need for therapy.  Improved  . Hyperlipidemia   . Hypertension   . UTI (lower urinary tract infection)   . Colon polyps   . Diverticula, colon   . Chicken pox   . Hemorrhoids     Current Outpatient Prescriptions on File Prior to Visit  Medication Sig Dispense Refill  . amLODipine (NORVASC) 10 MG tablet Take 1 tablet (10 mg total) by mouth daily. 90 tablet 1  . clopidogrel (PLAVIX) 75 MG tablet TAKE ONE TABLET ('75MG'$  TOTAL) BY MOUTH DAILY 90 tablet 1  . esomeprazole (NEXIUM) 20 MG packet Take 20 mg by mouth daily before breakfast. OTC    . ibuprofen (ADVIL,MOTRIN) 200 MG tablet Take 600 mg by mouth every 6 (six) hours as needed.    . metFORMIN (GLUCOPHAGE) 500 MG tablet TAKE ONE TABLET (50OMG TOTAL) BY MOUTH DAILY WITH BREAKFAST 30 tablet 5  . metoprolol tartrate (LOPRESSOR) 25 MG tablet Take 1 tablet (25 mg total) by mouth 2 (two) times daily. 180 tablet 1  . pravastatin (PRAVACHOL) 40 MG tablet TAKE 1 TABLET (40 MG TOTAL) BY MOUTH DAILY. 90 tablet 1  . quinapril (ACCUPRIL) 40 MG tablet TAKE ONE TABLET ('40MG'$  TOTAL)  BY MOUTH DAILY 90 tablet 1  . traMADol (ULTRAM) 50 MG tablet Take 1 tablet (50 mg total) by mouth every 6 (six) hours as needed for moderate pain. (Patient not taking: Reported on 11/20/2015) 20 tablet 0   No current facility-administered medications on file prior to visit.    Allergies  Allergen Reactions  . Lipitor [Atorvastatin Calcium]     Muscle ache    Family History  Problem Relation Age of Onset  . Heart Problems Mother     Skipping    Social History   Social History  . Marital Status: Married    Spouse Name: N/A  . Number  of Children: 2  . Years of Education: N/A   Occupational History  . Minister    Social History Main Topics  . Smoking status: Never Smoker   . Smokeless tobacco: Never Used  . Alcohol Use: No  . Drug Use: No  . Sexual Activity: Not Asked   Other Topics Concern  . None   Social History Narrative   Lives with wife.     Review of Systems - See HPI.  All other ROS are negative.  BP 118/74 mmHg  Pulse 58  Temp(Src) 98.4 F (36.9 C) (Oral)  Resp 18  Ht 6' (1.829 m)  Wt 243 lb (110.224 kg)  BMI 32.95 kg/m2  SpO2 94%  Physical Exam  Constitutional: He is oriented to person, place, and time and well-developed, well-nourished, and in no distress.  HENT:  Head: Normocephalic and atraumatic.  Eyes: Conjunctivae are normal.  Neck: Neck supple.  Cardiovascular: Normal rate, regular rhythm, normal heart sounds and intact distal pulses.   Pulmonary/Chest: Effort normal and breath sounds normal. No respiratory distress. He has no wheezes. He has no rales. He exhibits no tenderness.  Abdominal: There is no tenderness.  Negative CVA tenderness  Neurological: He is alert and oriented to person, place, and time.  Skin: Skin is warm and dry. No rash noted.  Psychiatric: Affect normal.  Vitals reviewed.   Recent Results (from the past 2160 hour(s))  Influenza A/B     Status: Normal   Collection Time: 11/05/15  8:37 AM  Result Value Ref Range   Influenza A, POC Negative Negative   Influenza B, POC Negative Negative  Basic metabolic panel     Status: Abnormal   Collection Time: 11/09/15 10:15 PM  Result Value Ref Range   Sodium 141 135 - 145 mmol/L   Potassium 3.5 3.5 - 5.1 mmol/L   Chloride 101 101 - 111 mmol/L   CO2 26 22 - 32 mmol/L   Glucose, Bld 151 (H) 65 - 99 mg/dL   BUN 27 (H) 6 - 20 mg/dL   Creatinine, Ser 1.73 (H) 0.61 - 1.24 mg/dL   Calcium 9.8 8.9 - 10.3 mg/dL   GFR calc non Af Amer 37 (L) >60 mL/min   GFR calc Af Amer 42 (L) >60 mL/min    Comment: (NOTE) The  eGFR has been calculated using the CKD EPI equation. This calculation has not been validated in all clinical situations. eGFR's persistently <60 mL/min signify possible Chronic Kidney Disease.    Anion gap 14 5 - 15  CBC     Status: None   Collection Time: 11/09/15 10:15 PM  Result Value Ref Range   WBC 7.5 4.0 - 10.5 K/uL   RBC 5.72 4.22 - 5.81 MIL/uL   Hemoglobin 16.6 13.0 - 17.0 g/dL   HCT 50.4 39.0 - 52.0 %   MCV 88.1 78.0 - 100.0 fL   MCH 29.0 26.0 - 34.0 pg   MCHC 32.9 30.0 - 36.0 g/dL   RDW 14.9 11.5 - 15.5 %   Platelets 193 150 - 400 K/uL  CK     Status: Abnormal   Collection Time: 11/09/15 10:15 PM  Result Value Ref Range   Total CK 40 (L) 49 - 397 U/L  Brain natriuretic peptide     Status: None   Collection Time: 11/09/15 10:15 PM  Result Value Ref Range   B Natriuretic Peptide 33.7 0.0 - 100.0 pg/mL  T4, free     Status: None   Collection Time: 11/09/15 10:15 PM  Result Value Ref Range   Free T4 1.06 0.61 - 1.12 ng/dL  I-Stat Troponin, ED - 0, 3, 6 hours (not at [facility])     Status: None   Collection Time: 11/09/15 10:34 PM  Result Value Ref Range   Troponin i, poc 0.03 0.00 - 0.08 ng/mL   Comment 3            Comment: Due to the release kinetics of cTnI, a negative result within the first hours of the onset of symptoms does not rule out myocardial infarction with certainty. If myocardial infarction is still suspected, repeat the test at appropriate intervals.   Blood culture (routine x 2)     Status: None   Collection Time: 11/09/15 10:35 PM  Result Value Ref Range   Specimen Description BLOOD LEFT ARM    Special Requests BOTTLES DRAWN AEROBIC AND ANAEROBIC 5CC     Culture NO GROWTH 5 DAYS    Report Status 11/14/2015 FINAL   I-Stat CG4 Lactic Acid, ED     Status: Abnormal   Collection Time: 11/09/15 10:36 PM  Result Value Ref Range   Lactic Acid, Venous 4.57 (HH) 0.5 - 2.0 mmol/L   Comment NOTIFIED PHYSICIAN   I-Stat Chem  8, ED     Status: Abnormal    Collection Time: 11/09/15 10:36 PM  Result Value Ref Range   Sodium 141 135 - 145 mmol/L   Potassium 3.4 (L) 3.5 - 5.1 mmol/L   Chloride 100 (L) 101 - 111 mmol/L   BUN 30 (H) 6 - 20 mg/dL   Creatinine, Ser 1.60 (H) 0.61 - 1.24 mg/dL   Glucose, Bld 149 (H) 65 - 99 mg/dL   Calcium, Ion 1.14 1.13 - 1.30 mmol/L   TCO2 24 0 - 100 mmol/L   Hemoglobin 17.7 (H) 13.0 - 17.0 g/dL   HCT 52.0 39.0 - 52.0 %  Blood culture (routine x 2)     Status: None   Collection Time: 11/09/15 10:40 PM  Result Value Ref Range   Specimen Description BLOOD LEFT HAND    Special Requests BOTTLES DRAWN AEROBIC AND ANAEROBIC 5CC     Culture NO GROWTH 5 DAYS    Report Status 11/14/2015 FINAL   TSH     Status: None   Collection Time: 11/09/15 10:40 PM  Result Value Ref Range   TSH 2.780 0.350 - 4.500 uIU/mL  CBG monitoring, ED     Status: Abnormal   Collection Time: 11/09/15 10:50 PM  Result Value Ref Range   Glucose-Capillary 131 (H) 65 - 99 mg/dL   Comment 1 Notify RN    Comment 2 Document in Chart   Urinalysis, Routine w reflex microscopic (not at [facility])     Status: Abnormal   Collection Time: 11/09/15 11:15 PM  Result Value Ref Range   Color, Urine AMBER (A) YELLOW    Comment: BIOCHEMICALS MAY BE AFFECTED BY COLOR   APPearance CLOUDY (A) CLEAR   Specific Gravity, Urine 1.020 1.005 - 1.030   pH 5.0 5.0 - 8.0   Glucose, UA NEGATIVE NEGATIVE mg/dL   Hgb urine dipstick NEGATIVE NEGATIVE   Bilirubin Urine SMALL (A) NEGATIVE   Ketones, ur NEGATIVE NEGATIVE mg/dL   Protein, ur NEGATIVE NEGATIVE mg/dL   Nitrite NEGATIVE NEGATIVE   Leukocytes, UA NEGATIVE NEGATIVE    Comment: MICROSCOPIC NOT DONE ON URINES WITH NEGATIVE PROTEIN, BLOOD, LEUKOCYTES, NITRITE, OR GLUCOSE <1000 mg/dL.  Urine culture     Status: None   Collection Time: 11/09/15 11:15 PM  Result Value Ref Range   Specimen Description URINE, CLEAN CATCH    Special Requests NONE    Culture MULTIPLE SPECIES PRESENT, SUGGEST RECOLLECTION    Report  Status 11/11/2015 FINAL   I-Stat CG4 Lactic Acid, ED     Status: Abnormal   Collection Time: 11/09/15 11:43 PM  Result Value Ref Range   Lactic Acid, Venous 2.93 (HH) 0.5 - 2.0 mmol/L   Comment NOTIFIED PHYSICIAN   Influenza panel by PCR (type A & B, H1N1)     Status: None   Collection Time: 11/09/15 11:44 PM  Result Value Ref Range   Influenza A By PCR NEGATIVE NEGATIVE   Influenza B By PCR NEGATIVE NEGATIVE   H1N1 flu by pcr NOT DETECTED NOT DETECTED    Comment:        The Xpert Flu assay (FDA approved for nasal aspirates or washes and nasopharyngeal swab specimens), is intended as an aid in the diagnosis of influenza and should not be used as a sole basis for treatment.   Procalcitonin - Baseline     Status: None   Collection Time: 11/10/15  1:15 AM  Result Value Ref Range   Procalcitonin 12.91 ng/mL  Comment:        Interpretation: PCT >= 10 ng/mL: Important systemic inflammatory response, almost exclusively due to severe bacterial sepsis or septic shock. (NOTE)         ICU PCT Algorithm               Non ICU PCT Algorithm    ----------------------------     ------------------------------         PCT < 0.25 ng/mL                 PCT < 0.1 ng/mL     Stopping of antibiotics            Stopping of antibiotics       strongly encouraged.               strongly encouraged.    ----------------------------     ------------------------------       PCT level decrease by               PCT < 0.25 ng/mL       >= 80% from peak PCT       OR PCT 0.25 - 0.5 ng/mL          Stopping of antibiotics                                             encouraged.     Stopping of antibiotics           encouraged.    ----------------------------     ------------------------------       PCT level decrease by              PCT >= 0.25 ng/mL       < 80% from peak PCT        AND PCT >= 0.5 ng/mL             Continuing antibiotics                                              encouraged.        Continuing antibiotics            encouraged.    ----------------------------     ------------------------------     PCT level increase compared          PCT > 0.5 ng/mL         with peak PCT AND          PCT >= 0.5 ng/mL             Escalation of antibiotics                                          strongly encouraged.      Escalation of antibiotics        strongly encouraged.   I-Stat Troponin, ED - 0, 3, 6 hours (not at [facility])     Status: None   Collection Time: 11/10/15  1:21 AM  Result Value Ref Range   Troponin i, poc 0.02 0.00 - 0.08 ng/mL   Comment 3  Comment: Due to the release kinetics of cTnI, a negative result within the first hours of the onset of symptoms does not rule out myocardial infarction with certainty. If myocardial infarction is still suspected, repeat the test at appropriate intervals.   I-Stat CG4 Lactic Acid, ED     Status: Abnormal   Collection Time: 11/10/15  1:23 AM  Result Value Ref Range   Lactic Acid, Venous 3.87 (HH) 0.5 - 2.0 mmol/L   Comment NOTIFIED PHYSICIAN   MRSA PCR Screening     Status: None   Collection Time: 11/10/15  1:43 AM  Result Value Ref Range   MRSA by PCR NEGATIVE NEGATIVE    Comment:        The GeneXpert MRSA Assay (FDA approved for NASAL specimens only), is one component of a comprehensive MRSA colonization surveillance program. It is not intended to diagnose MRSA infection nor to guide or monitor treatment for MRSA infections.   Troponin I     Status: None   Collection Time: 11/10/15  3:23 AM  Result Value Ref Range   Troponin I <0.03 <0.031 ng/mL    Comment:        NO INDICATION OF MYOCARDIAL INJURY.   CBC with Differential     Status: Abnormal   Collection Time: 11/10/15  3:23 AM  Result Value Ref Range   WBC 12.8 (H) 4.0 - 10.5 K/uL   RBC 4.66 4.22 - 5.81 MIL/uL   Hemoglobin 13.3 13.0 - 17.0 g/dL    Comment: DELTA CHECK NOTED REPEATED TO VERIFY    HCT 41.1 39.0 - 52.0 %   MCV 88.2 78.0 - 100.0  fL   MCH 28.5 26.0 - 34.0 pg   MCHC 32.4 30.0 - 36.0 g/dL   RDW 15.2 11.5 - 15.5 %   Platelets 164 150 - 400 K/uL   Neutrophils Relative % 95 %   Neutro Abs 12.1 (H) 1.7 - 7.7 K/uL   Lymphocytes Relative 2 %   Lymphs Abs 0.2 (L) 0.7 - 4.0 K/uL   Monocytes Relative 3 %   Monocytes Absolute 0.4 0.1 - 1.0 K/uL   Eosinophils Relative 1 %   Eosinophils Absolute 0.1 0.0 - 0.7 K/uL   Basophils Relative 0 %   Basophils Absolute 0.0 0.0 - 0.1 K/uL  Comprehensive metabolic panel     Status: Abnormal   Collection Time: 11/10/15  3:23 AM  Result Value Ref Range   Sodium 140 135 - 145 mmol/L   Potassium 3.7 3.5 - 5.1 mmol/L   Chloride 108 101 - 111 mmol/L   CO2 22 22 - 32 mmol/L   Glucose, Bld 160 (H) 65 - 99 mg/dL   BUN 26 (H) 6 - 20 mg/dL   Creatinine, Ser 1.73 (H) 0.61 - 1.24 mg/dL   Calcium 7.9 (L) 8.9 - 10.3 mg/dL   Total Protein 5.2 (L) 6.5 - 8.1 g/dL   Albumin 2.8 (L) 3.5 - 5.0 g/dL   AST 47 (H) 15 - 41 U/L   ALT 44 17 - 63 U/L   Alkaline Phosphatase 36 (L) 38 - 126 U/L   Total Bilirubin 0.7 0.3 - 1.2 mg/dL   GFR calc non Af Amer 37 (L) >60 mL/min   GFR calc Af Amer 42 (L) >60 mL/min    Comment: (NOTE) The eGFR has been calculated using the CKD EPI equation. This calculation has not been validated in all clinical situations. eGFR's persistently <60 mL/min signify possible Chronic Kidney Disease.  Anion gap 10 5 - 15  Lactic acid, plasma     Status: Abnormal   Collection Time: 11/10/15  3:23 AM  Result Value Ref Range   Lactic Acid, Venous 3.3 (HH) 0.5 - 2.0 mmol/L    Comment: CRITICAL RESULT CALLED TO, READ BACK BY AND VERIFIED WITH: KHOKHEL,S RN [id number] 1.30.17 [id number]   Procalcitonin     Status: None   Collection Time: 11/10/15  3:23 AM  Result Value Ref Range   Procalcitonin 21.31 ng/mL    Comment:        Interpretation: PCT >= 10 ng/mL: Important systemic inflammatory response, almost exclusively due to severe bacterial sepsis or septic shock. (NOTE)          ICU PCT Algorithm               Non ICU PCT Algorithm    ----------------------------     ------------------------------         PCT < 0.25 ng/mL                 PCT < 0.1 ng/mL     Stopping of antibiotics            Stopping of antibiotics       strongly encouraged.               strongly encouraged.    ----------------------------     ------------------------------       PCT level decrease by               PCT < 0.25 ng/mL       >= 80% from peak PCT       OR PCT 0.25 - 0.5 ng/mL          Stopping of antibiotics                                             encouraged.     Stopping of antibiotics           encouraged.    ----------------------------     ------------------------------       PCT level decrease by              PCT >= 0.25 ng/mL       < 80% from peak PCT        AND PCT >= 0.5 ng/mL             Continuing antibiotics                                              encouraged.       Continuing antibiotics            encouraged.    ----------------------------     ------------------------------     PCT level increase compared          PCT > 0.5 ng/mL         with peak PCT AND          PCT >= 0.5 ng/mL             Escalation of antibiotics  strongly encouraged.      Escalation of antibiotics        strongly encouraged.   Protime-INR     Status: Abnormal   Collection Time: 11/10/15  3:23 AM  Result Value Ref Range   Prothrombin Time 15.9 (H) 11.6 - 15.2 seconds   INR 1.26 0.00 - 1.49  APTT     Status: None   Collection Time: 11/10/15  3:23 AM  Result Value Ref Range   aPTT 26 24 - 37 seconds  Glucose, capillary     Status: Abnormal   Collection Time: 11/10/15  8:56 AM  Result Value Ref Range   Glucose-Capillary 177 (H) 65 - 99 mg/dL   Comment 1 Document in Chart   Glucose, capillary     Status: None   Collection Time: 11/10/15 12:25 PM  Result Value Ref Range   Glucose-Capillary 96 65 - 99 mg/dL  Cortisol     Status: None    Collection Time: 11/10/15  1:53 PM  Result Value Ref Range   Cortisol, Plasma 17.8 ug/dL    Comment: (NOTE) AM    6.7 - 22.6 ug/dL PM   <10.0       ug/dL   Lactic acid, plasma     Status: None   Collection Time: 11/10/15  1:53 PM  Result Value Ref Range   Lactic Acid, Venous 1.6 0.5 - 2.0 mmol/L  Glucose, capillary     Status: Abnormal   Collection Time: 11/10/15  4:47 PM  Result Value Ref Range   Glucose-Capillary 130 (H) 65 - 99 mg/dL  Lactic acid, plasma     Status: None   Collection Time: 11/10/15  7:20 PM  Result Value Ref Range   Lactic Acid, Venous 1.6 0.5 - 2.0 mmol/L  Glucose, capillary     Status: Abnormal   Collection Time: 11/10/15 10:04 PM  Result Value Ref Range   Glucose-Capillary 108 (H) 65 - 99 mg/dL  CBC     Status: Abnormal   Collection Time: 11/11/15  2:52 AM  Result Value Ref Range   WBC 9.5 4.0 - 10.5 K/uL   RBC 4.23 4.22 - 5.81 MIL/uL   Hemoglobin 11.8 (L) 13.0 - 17.0 g/dL   HCT 37.1 (L) 39.0 - 52.0 %   MCV 87.7 78.0 - 100.0 fL   MCH 27.9 26.0 - 34.0 pg   MCHC 31.8 30.0 - 36.0 g/dL   RDW 15.5 11.5 - 15.5 %   Platelets 171 150 - 400 K/uL  Comprehensive metabolic panel     Status: Abnormal   Collection Time: 11/11/15  2:52 AM  Result Value Ref Range   Sodium 138 135 - 145 mmol/L   Potassium 3.8 3.5 - 5.1 mmol/L   Chloride 106 101 - 111 mmol/L   CO2 22 22 - 32 mmol/L   Glucose, Bld 128 (H) 65 - 99 mg/dL   BUN 18 6 - 20 mg/dL   Creatinine, Ser 1.46 (H) 0.61 - 1.24 mg/dL   Calcium 7.9 (L) 8.9 - 10.3 mg/dL   Total Protein 5.1 (L) 6.5 - 8.1 g/dL   Albumin 2.7 (L) 3.5 - 5.0 g/dL   AST 22 15 - 41 U/L   ALT 34 17 - 63 U/L   Alkaline Phosphatase 35 (L) 38 - 126 U/L   Total Bilirubin 0.7 0.3 - 1.2 mg/dL   GFR calc non Af Amer 45 (L) >60 mL/min   GFR calc Af Amer 52 (L) >60 mL/min  Comment: (NOTE) The eGFR has been calculated using the CKD EPI equation. This calculation has not been validated in all clinical situations. eGFR's persistently <60  mL/min signify possible Chronic Kidney Disease.    Anion gap 10 5 - 15  Glucose, capillary     Status: Abnormal   Collection Time: 11/11/15  7:34 AM  Result Value Ref Range   Glucose-Capillary 122 (H) 65 - 99 mg/dL  Glucose, capillary     Status: Abnormal   Collection Time: 11/11/15 12:24 PM  Result Value Ref Range   Glucose-Capillary 117 (H) 65 - 99 mg/dL  Culture, Urine     Status: None   Collection Time: 11/11/15  5:00 PM  Result Value Ref Range   Specimen Description URINE, CLEAN CATCH    Special Requests NONE    Culture NO GROWTH 1 DAY    Report Status 11/12/2015 FINAL   Glucose, capillary     Status: Abnormal   Collection Time: 11/11/15  5:05 PM  Result Value Ref Range   Glucose-Capillary 117 (H) 65 - 99 mg/dL  Glucose, capillary     Status: Abnormal   Collection Time: 11/11/15 10:09 PM  Result Value Ref Range   Glucose-Capillary 124 (H) 65 - 99 mg/dL  Procalcitonin     Status: None   Collection Time: 11/12/15  3:50 AM  Result Value Ref Range   Procalcitonin 4.05 ng/mL    Comment:        Interpretation: PCT > 2 ng/mL: Systemic infection (sepsis) is likely, unless other causes are known. (NOTE)         ICU PCT Algorithm               Non ICU PCT Algorithm    ----------------------------     ------------------------------         PCT < 0.25 ng/mL                 PCT < 0.1 ng/mL     Stopping of antibiotics            Stopping of antibiotics       strongly encouraged.               strongly encouraged.    ----------------------------     ------------------------------       PCT level decrease by               PCT < 0.25 ng/mL       >= 80% from peak PCT       OR PCT 0.25 - 0.5 ng/mL          Stopping of antibiotics                                             encouraged.     Stopping of antibiotics           encouraged.    ----------------------------     ------------------------------       PCT level decrease by              PCT >= 0.25 ng/mL       < 80% from peak  PCT        AND PCT >= 0.5 ng/mL            Continuing antibiotics  encouraged.       Continuing antibiotics            encouraged.    ----------------------------     ------------------------------     PCT level increase compared          PCT > 0.5 ng/mL         with peak PCT AND          PCT >= 0.5 ng/mL             Escalation of antibiotics                                          strongly encouraged.      Escalation of antibiotics        strongly encouraged.   Glucose, capillary     Status: Abnormal   Collection Time: 11/12/15  7:34 AM  Result Value Ref Range   Glucose-Capillary 126 (H) 65 - 99 mg/dL   Comment 1 Notify RN   Glucose, capillary     Status: Abnormal   Collection Time: 11/12/15 12:22 PM  Result Value Ref Range   Glucose-Capillary 121 (H) 65 - 99 mg/dL  Glucose, capillary     Status: Abnormal   Collection Time: 11/12/15  6:17 PM  Result Value Ref Range   Glucose-Capillary 152 (H) 65 - 99 mg/dL  Glucose, capillary     Status: Abnormal   Collection Time: 11/12/15  9:50 PM  Result Value Ref Range   Glucose-Capillary 129 (H) 65 - 99 mg/dL  Comprehensive metabolic panel     Status: Abnormal   Collection Time: 11/13/15  5:18 AM  Result Value Ref Range   Sodium 138 135 - 145 mmol/L   Potassium 3.9 3.5 - 5.1 mmol/L   Chloride 104 101 - 111 mmol/L   CO2 24 22 - 32 mmol/L   Glucose, Bld 134 (H) 65 - 99 mg/dL   BUN 12 6 - 20 mg/dL   Creatinine, Ser 1.23 0.61 - 1.24 mg/dL   Calcium 8.8 (L) 8.9 - 10.3 mg/dL   Total Protein 6.2 (L) 6.5 - 8.1 g/dL   Albumin 2.9 (L) 3.5 - 5.0 g/dL   AST 14 (L) 15 - 41 U/L   ALT 22 17 - 63 U/L   Alkaline Phosphatase 38 38 - 126 U/L   Total Bilirubin 0.7 0.3 - 1.2 mg/dL   GFR calc non Af Amer 55 (L) >60 mL/min   GFR calc Af Amer >60 >60 mL/min    Comment: (NOTE) The eGFR has been calculated using the CKD EPI equation. This calculation has not been validated in all clinical  situations. eGFR's persistently <60 mL/min signify possible Chronic Kidney Disease.    Anion gap 10 5 - 15  CBC     Status: Abnormal   Collection Time: 11/13/15  5:18 AM  Result Value Ref Range   WBC 11.0 (H) 4.0 - 10.5 K/uL   RBC 4.84 4.22 - 5.81 MIL/uL   Hemoglobin 14.0 13.0 - 17.0 g/dL   HCT 42.0 39.0 - 52.0 %   MCV 86.8 78.0 - 100.0 fL   MCH 28.9 26.0 - 34.0 pg   MCHC 33.3 30.0 - 36.0 g/dL   RDW 15.3 11.5 - 15.5 %   Platelets 187 150 - 400 K/uL  C-reactive protein     Status: Abnormal   Collection Time: 11/13/15  5:18 AM  Result Value Ref Range   CRP 10.3 (H) <1.0 mg/dL  Glucose, capillary     Status: Abnormal   Collection Time: 11/13/15  8:20 AM  Result Value Ref Range   Glucose-Capillary 114 (H) 65 - 99 mg/dL  Glucose, capillary     Status: Abnormal   Collection Time: 11/13/15 12:24 PM  Result Value Ref Range   Glucose-Capillary 126 (H) 65 - 99 mg/dL  Glucose, capillary     Status: Abnormal   Collection Time: 11/13/15  5:47 PM  Result Value Ref Range   Glucose-Capillary 127 (H) 65 - 99 mg/dL  Glucose, capillary     Status: Abnormal   Collection Time: 11/13/15  9:45 PM  Result Value Ref Range   Glucose-Capillary 125 (H) 65 - 99 mg/dL  Glucose, capillary     Status: Abnormal   Collection Time: 11/14/15  8:19 AM  Result Value Ref Range   Glucose-Capillary 129 (H) 65 - 99 mg/dL  D-dimer, quantitative (not at [facility])     Status: Abnormal   Collection Time: 11/14/15 10:34 AM  Result Value Ref Range   D-Dimer, Quant 1.25 (H) 0.00 - 0.50 ug/mL-FEU    Comment: (NOTE) At the manufacturer cut-off of 0.50 ug/mL FEU, this assay has been documented to exclude PE with a sensitivity and negative predictive value of 97 to 99%.  At this time, this assay has not been approved by the FDA to exclude DVT/VTE. Results should be correlated with clinical presentation.   Rocky mtn spotted fvr abs pnl(IgG+IgM)     Status: None   Collection Time: 11/14/15 10:34 AM  Result Value Ref  Range   RMSF IgG Negative Negative   RMSF IgM 0.12 0.00 - 0.89 index    Comment: (NOTE)                                 Negative        <0.90                                 Equivocal 0.90 - 1.10                                 Positive        >1.10 Performed At: [facility] [address] [place], [place] [id number] Mila Dois MD OP:[id number]   Procalcitonin - Baseline     Status: None   Collection Time: 11/14/15 10:34 AM  Result Value Ref Range   Procalcitonin 0.71 ng/mL    Comment:        Interpretation: PCT > 0.5 ng/mL and <= 2 ng/mL: Systemic infection (sepsis) is possible, but other conditions are known to elevate PCT as well. (NOTE)         ICU PCT Algorithm               Non ICU PCT Algorithm    ----------------------------     ------------------------------         PCT < 0.25 ng/mL                 PCT < 0.1 ng/mL     Stopping of antibiotics            Stopping of antibiotics       strongly  encouraged.               strongly encouraged.    ----------------------------     ------------------------------       PCT level decrease by               PCT < 0.25 ng/mL       >= 80% from peak PCT       OR PCT 0.25 - 0.5 ng/mL          Stopping of antibiotics                                             encouraged.     Stopping of antibiotics           encouraged.    ----------------------------     ------------------------------       PCT level decrease by              PCT >= 0.25 ng/mL       < 80% from peak PCT        AND PCT >= 0.5 ng/mL             Continuing antibiotics                                              encouraged.       Continuing antibiotics            encouraged.    ----------------------------     ------------------------------     PCT level increase compared          PCT > 0.5 ng/mL         with peak PCT AND          PCT >= 0.5 ng/mL             Escalation of antibiotics                                          strongly encouraged.       Escalation of antibiotics        strongly encouraged.   Glucose, capillary     Status: Abnormal   Collection Time: 11/14/15 12:56 PM  Result Value Ref Range   Glucose-Capillary 121 (H) 65 - 99 mg/dL  Glucose, capillary     Status: Abnormal   Collection Time: 11/14/15  4:23 PM  Result Value Ref Range   Glucose-Capillary 119 (H) 65 - 99 mg/dL  Glucose, capillary     Status: Abnormal   Collection Time: 11/14/15  9:55 PM  Result Value Ref Range   Glucose-Capillary 127 (H) 65 - 99 mg/dL  Comprehensive metabolic panel     Status: Abnormal   Collection Time: 11/15/15  3:37 AM  Result Value Ref Range   Sodium 139 135 - 145 mmol/L   Potassium 4.0 3.5 - 5.1 mmol/L   Chloride 104 101 - 111 mmol/L   CO2 23 22 - 32 mmol/L   Glucose, Bld 125 (H) 65 - 99 mg/dL   BUN 19 6 - 20 mg/dL   Creatinine, Ser 1.13 0.61 - 1.24 mg/dL   Calcium  8.8 (L) 8.9 - 10.3 mg/dL   Total Protein 6.2 (L) 6.5 - 8.1 g/dL   Albumin 2.6 (L) 3.5 - 5.0 g/dL   AST 18 15 - 41 U/L   ALT 19 17 - 63 U/L   Alkaline Phosphatase 37 (L) 38 - 126 U/L   Total Bilirubin 0.8 0.3 - 1.2 mg/dL   GFR calc non Af Amer >60 >60 mL/min   GFR calc Af Amer >60 >60 mL/min    Comment: (NOTE) The eGFR has been calculated using the CKD EPI equation. This calculation has not been validated in all clinical situations. eGFR's persistently <60 mL/min signify possible Chronic Kidney Disease.    Anion gap 12 5 - 15  CBC     Status: None   Collection Time: 11/15/15  3:37 AM  Result Value Ref Range   WBC 7.2 4.0 - 10.5 K/uL   RBC 4.71 4.22 - 5.81 MIL/uL   Hemoglobin 13.5 13.0 - 17.0 g/dL   HCT 41.1 39.0 - 52.0 %   MCV 87.3 78.0 - 100.0 fL   MCH 28.7 26.0 - 34.0 pg   MCHC 32.8 30.0 - 36.0 g/dL   RDW 15.3 11.5 - 15.5 %   Platelets 236 150 - 400 K/uL  Glucose, capillary     Status: Abnormal   Collection Time: 11/15/15  8:39 AM  Result Value Ref Range   Glucose-Capillary 117 (H) 65 - 99 mg/dL  Glucose, capillary     Status: None   Collection  Time: 11/15/15  1:08 PM  Result Value Ref Range   Glucose-Capillary 94 65 - 99 mg/dL  Glucose, capillary     Status: None   Collection Time: 11/15/15  6:00 PM  Result Value Ref Range   Glucose-Capillary 99 65 - 99 mg/dL  Glucose, capillary     Status: Abnormal   Collection Time: 11/15/15  9:34 PM  Result Value Ref Range   Glucose-Capillary 144 (H) 65 - 99 mg/dL  CBC     Status: None   Collection Time: 11/16/15  3:40 AM  Result Value Ref Range   WBC 6.4 4.0 - 10.5 K/uL   RBC 4.82 4.22 - 5.81 MIL/uL   Hemoglobin 13.5 13.0 - 17.0 g/dL   HCT 42.2 39.0 - 52.0 %   MCV 87.6 78.0 - 100.0 fL   MCH 28.0 26.0 - 34.0 pg   MCHC 32.0 30.0 - 36.0 g/dL   RDW 15.2 11.5 - 15.5 %   Platelets 265 150 - 400 K/uL  Glucose, capillary     Status: None   Collection Time: 11/16/15  8:30 AM  Result Value Ref Range   Glucose-Capillary 97 65 - 99 mg/dL  Glucose, capillary     Status: Abnormal   Collection Time: 11/16/15 12:39 PM  Result Value Ref Range   Glucose-Capillary 110 (H) 65 - 99 mg/dL  Hemoglobin A1c     Status: None   Collection Time: 11/20/15 12:10 PM  Result Value Ref Range   Hgb A1c MFr Bld 6.5 4.6 - 6.5 %    Comment: Glycemic Control Guidelines for People with Diabetes:Non Diabetic:  <6%Goal of Therapy: <7%Additional Action Suggested:  >8%   HM DIABETES EYE EXAM     Status: None   Collection Time: 12/18/15 12:00 AM  Result Value Ref Range   HM Diabetic Eye Exam No Retinopathy No Retinopathy  POCT urinalysis dipstick     Status: Abnormal   Collection Time: 12/20/15 12:18 PM  Result Value Ref Range  Color, UA yellow    Clarity, UA cloud    Glucose, UA neg    Bilirubin, UA neg    Ketones, UA neg    Spec Grav, UA >=1.030    Blood, UA 1+    pH, UA 6.0    Protein, UA neg    Urobilinogen, UA 0.2    Nitrite, UA pos    Leukocytes, UA large (3+) (A) Negative    Assessment/Plan: UTI symptoms -- Urine dip with 3+ LE and 1+ blood. Will send for culture. Patient with positive history of  UTI. Will treat empirically with Bactrim instead of Cipro giving recent EKG with prolonged QTc. Supportive measures reviewed. Follow-up with PCP on Friday.

## 2015-12-20 NOTE — Progress Notes (Signed)
Pre visit review using our clinic review tool, if applicable. No additional management support is needed unless otherwise documented below in the visit note. 

## 2015-12-20 NOTE — Patient Instructions (Signed)
Your symptoms are consistent with a bladder infection, also called acute cystitis. Please take your antibiotic (Bactrim) as directed until all pills are gone.  Stay very well hydrated.  Consider a daily probiotic (Align, Culturelle, or Activia) to help prevent stomach upset caused by the antibiotic.  Taking a probiotic daily may also help prevent recurrent UTIs.  Also consider taking AZO (Phenazopyridine) tablets to help decrease pain with urination.  I will call you with your urine testing results.  We will change antibiotics if indicated.   Follow-up with your PCP Friday  Urinary Tract Infection A urinary tract infection (UTI) can occur any place along the urinary tract. The tract includes the kidneys, ureters, bladder, and urethra. A type of germ called bacteria often causes a UTI. UTIs are often helped with antibiotic medicine.  HOME CARE   If given, take antibiotics as told by your doctor. Finish them even if you start to feel better.  Drink enough fluids to keep your pee (urine) clear or pale yellow.  Avoid tea, drinks with caffeine, and bubbly (carbonated) drinks.  Pee often. Avoid holding your pee in for a long time.  Pee before and after having sex (intercourse).  Wipe from front to back after you poop (bowel movement) if you are a woman. Use each tissue only once. GET HELP RIGHT AWAY IF:   You have back pain.  You have lower belly (abdominal) pain.  You have chills.  You feel sick to your stomach (nauseous).  You throw up (vomit).  Your burning or discomfort with peeing does not go away.  You have a fever.  Your symptoms are not better in 3 days. MAKE SURE YOU:   Understand these instructions.  Will watch your condition.  Will get help right away if you are not doing well or get worse. Document Released: 03/15/2008 Document Revised: 06/21/2012 Document Reviewed: 04/27/2012 [facility] Patient Information 2015 [place], [place]. This information is not intended to  replace advice given to you by your health care provider. Make sure you discuss any questions you have with your health care provider.

## 2015-12-20 NOTE — Telephone Encounter (Signed)
i received call about bactrim for this patient with UTI seen by PA in Saturday clinic.   PharmD raised question combined with ACE inh, could increase risk hyperkalemia.   Also with QTc on EKG, I elected to keep him on 7 d of bactrim which is very low risk of potential negative outcome.  Electronically Signed  By: Owens Loffler, MD On: 12/20/2015 3:31 PM

## 2015-12-22 LAB — URINE CULTURE

## 2015-12-28 ENCOUNTER — Encounter (HOSPITAL_COMMUNITY): Payer: Self-pay | Admitting: Adult Health

## 2015-12-28 ENCOUNTER — Emergency Department (HOSPITAL_COMMUNITY)
Admission: EM | Admit: 2015-12-28 | Discharge: 2015-12-28 | Disposition: A | Discharge status: Home / Self Care | Payer: Medicare Other | Attending: Emergency Medicine | Admitting: Emergency Medicine

## 2015-12-28 ENCOUNTER — Emergency Department (HOSPITAL_COMMUNITY): Payer: Medicare Other

## 2015-12-28 DIAGNOSIS — R001 Bradycardia, unspecified: Secondary | ICD-10-CM | POA: Diagnosis not present

## 2015-12-28 DIAGNOSIS — Z7902 Long term (current) use of antithrombotics/antiplatelets: Secondary | ICD-10-CM | POA: Insufficient documentation

## 2015-12-28 DIAGNOSIS — I10 Essential (primary) hypertension: Secondary | ICD-10-CM | POA: Diagnosis not present

## 2015-12-28 DIAGNOSIS — Z86010 Personal history of colonic polyps: Secondary | ICD-10-CM | POA: Insufficient documentation

## 2015-12-28 DIAGNOSIS — Z79899 Other long term (current) drug therapy: Secondary | ICD-10-CM | POA: Insufficient documentation

## 2015-12-28 DIAGNOSIS — Z87440 Personal history of urinary (tract) infections: Secondary | ICD-10-CM | POA: Diagnosis not present

## 2015-12-28 DIAGNOSIS — R5383 Other fatigue: Secondary | ICD-10-CM | POA: Insufficient documentation

## 2015-12-28 DIAGNOSIS — E785 Hyperlipidemia, unspecified: Secondary | ICD-10-CM | POA: Insufficient documentation

## 2015-12-28 DIAGNOSIS — Z8619 Personal history of other infectious and parasitic diseases: Secondary | ICD-10-CM | POA: Diagnosis not present

## 2015-12-28 DIAGNOSIS — R05 Cough: Secondary | ICD-10-CM | POA: Diagnosis not present

## 2015-12-28 DIAGNOSIS — R059 Cough, unspecified: Secondary | ICD-10-CM

## 2015-12-28 DIAGNOSIS — Z859 Personal history of malignant neoplasm, unspecified: Secondary | ICD-10-CM | POA: Insufficient documentation

## 2015-12-28 DIAGNOSIS — Z7984 Long term (current) use of oral hypoglycemic drugs: Secondary | ICD-10-CM | POA: Insufficient documentation

## 2015-12-28 HISTORY — DX: Sepsis, unspecified organism: A41.9

## 2015-12-28 LAB — CBC WITH DIFFERENTIAL/PLATELET
Basophils Absolute: 0.1 10*3/uL (ref 0.0–0.1)
Basophils Relative: 1 %
EOS ABS: 0.3 10*3/uL (ref 0.0–0.7)
Eosinophils Relative: 4 %
HCT: 45.4 % (ref 39.0–52.0)
HEMOGLOBIN: 14.7 g/dL (ref 13.0–17.0)
LYMPHS ABS: 0.6 10*3/uL — AB (ref 0.7–4.0)
LYMPHS PCT: 10 %
MCH: 28.5 pg (ref 26.0–34.0)
MCHC: 32.4 g/dL (ref 30.0–36.0)
MCV: 88 fL (ref 78.0–100.0)
Monocytes Absolute: 0.8 10*3/uL (ref 0.1–1.0)
Monocytes Relative: 13 %
NEUTROS PCT: 72 %
Neutro Abs: 4.6 10*3/uL (ref 1.7–7.7)
Platelets: 189 10*3/uL (ref 150–400)
RBC: 5.16 MIL/uL (ref 4.22–5.81)
RDW: 14.3 % (ref 11.5–15.5)
WBC: 6.4 10*3/uL (ref 4.0–10.5)

## 2015-12-28 LAB — BASIC METABOLIC PANEL
Anion gap: 11 (ref 5–15)
BUN: 19 mg/dL (ref 6–20)
CHLORIDE: 104 mmol/L (ref 101–111)
CO2: 20 mmol/L — AB (ref 22–32)
CREATININE: 1.51 mg/dL — AB (ref 0.61–1.24)
Calcium: 9 mg/dL (ref 8.9–10.3)
GFR calc non Af Amer: 43 mL/min — ABNORMAL LOW (ref >60)
GFR, EST AFRICAN AMERICAN: 50 mL/min — AB (ref >60)
Glucose, Bld: 102 mg/dL — ABNORMAL HIGH (ref 65–99)
Potassium: 4.6 mmol/L (ref 3.5–5.1)
Sodium: 135 mmol/L (ref 135–145)

## 2015-12-28 LAB — URINALYSIS, ROUTINE W REFLEX MICROSCOPIC
Bilirubin Urine: NEGATIVE
Glucose, UA: NEGATIVE mg/dL
Hgb urine dipstick: NEGATIVE
Ketones, ur: NEGATIVE mg/dL
NITRITE: NEGATIVE
Protein, ur: NEGATIVE mg/dL
SPECIFIC GRAVITY, URINE: 1.013 (ref 1.005–1.030)
pH: 5.5 (ref 5.0–8.0)

## 2015-12-28 LAB — I-STAT CG4 LACTIC ACID, ED
Lactic Acid, Venous: 2.07 mmol/L (ref 0.5–2.0)
Lactic Acid, Venous: 1.89 mmol/L (ref 0.5–2.0)

## 2015-12-28 LAB — URINE MICROSCOPIC-ADD ON
BACTERIA UA: NONE SEEN
RBC / HPF: NONE SEEN RBC/hpf (ref 0–5)

## 2015-12-28 MED ORDER — BENZONATATE 100 MG PO CAPS: 100.0000 mg | ORAL_CAPSULE | Freq: Three times a day (TID) | ORAL | Status: DC | PRN

## 2015-12-28 MED ORDER — SODIUM CHLORIDE 0.9 % IV BOLUS (SEPSIS)
1000.0000 mL | Freq: Once | INTRAVENOUS | Status: AC
  Administered 2015-12-28: 1000 mL via INTRAVENOUS

## 2015-12-28 NOTE — ED Provider Notes (Signed)
CSN: [id number]     Arrival date & time 12/28/15  1104 History   First MD Initiated Contact with Patient 12/28/15 1500     Chief Complaint  Patient presents with  . Cough  . Fatigue     (Consider location/radiation/quality/duration/timing/severity/associated sxs/prior Treatment) Patient is a 75 y.o. male presenting with URI. The history is provided by the patient and the spouse.  URI Presenting symptoms: congestion, cough, fatigue and rhinorrhea (clear)   Presenting symptoms: no ear pain, no facial pain and no fever   Severity:  Moderate Onset quality:  Gradual Duration:  3 days Timing:  Constant Progression:  Worsening Chronicity:  New Relieved by:  Nothing Worsened by:  Nothing tried Associated symptoms: no headaches, no myalgias and no wheezing   Risk factors: recent illness (recent UTI 1 week ago and recent admission for sepsis of unknown source 1.5 months ago) and sick contacts (wife has same symptoms)   Risk factors: no immunosuppression     Past Medical History  Diagnosis Date  . Cancer (HCC)     hx - melanoma  . Depression     No need for therapy.  Improved  . Hyperlipidemia   . Hypertension   . UTI (lower urinary tract infection)   . Colon polyps   . Diverticula, colon   . Chicken pox   . Hemorrhoids   . Sepsis [facility])    Past Surgical History  Procedure Laterality Date  . Melanoma excision      LEFT CHEST  . Melanoma excision     Family History  Problem Relation Age of Onset  . Heart Problems Mother     Skipping   Social History  Substance Use Topics  . Smoking status: Never Smoker   . Smokeless tobacco: Never Used  . Alcohol Use: No    Review of Systems  Constitutional: Positive for fatigue. Negative for fever, diaphoresis, activity change and appetite change.  HENT: Positive for congestion and rhinorrhea (clear). Negative for ear pain, facial swelling, tinnitus, trouble swallowing and voice change.   Eyes: Negative for pain, redness and visual  disturbance.  Respiratory: Positive for cough. Negative for chest tightness, shortness of breath and wheezing.   Cardiovascular: Negative for chest pain, palpitations and leg swelling.  Gastrointestinal: Negative for nausea, vomiting, abdominal pain, diarrhea, constipation and abdominal distention.  Endocrine: Negative.   Genitourinary: Negative.  Negative for dysuria, decreased urine volume, scrotal swelling and testicular pain.  Musculoskeletal: Negative for myalgias, back pain and gait problem.  Skin: Negative.  Negative for rash.  Neurological: Negative.  Negative for dizziness, tremors, weakness and headaches.  Psychiatric/Behavioral: Negative for suicidal ideas, hallucinations and self-injury. The patient is not nervous/anxious.       Allergies  Lipitor  Home Medications   Prior to Admission medications   Medication Sig Start Date End Date Taking? Authorizing Provider  amLODipine (NORVASC) 10 MG tablet Take 1 tablet (10 mg total) by mouth daily. 11/20/15  Yes Marletta Lor, MD  clopidogrel (PLAVIX) 75 MG tablet TAKE ONE TABLET (75MG  TOTAL) BY MOUTH DAILY 06/06/15  Yes Marletta Lor, MD  esomeprazole (NEXIUM) 20 MG packet Take 20 mg by mouth daily before breakfast. OTC   Yes Historical Provider, MD  metFORMIN (GLUCOPHAGE) 500 MG tablet TAKE ONE TABLET (50OMG TOTAL) BY MOUTH DAILY WITH BREAKFAST 09/01/15  Yes Marletta Lor, MD  metoprolol tartrate (LOPRESSOR) 25 MG tablet Take 1 tablet (25 mg total) by mouth 2 (two) times daily. 11/20/15  Yes Collier Salina  Sherwood Gambler, MD  pravastatin (PRAVACHOL) 40 MG tablet TAKE 1 TABLET (40 MG TOTAL) BY MOUTH DAILY. Patient taking differently: TAKE 1 TABLET (40 MG TOTAL) BY every evening 06/06/15  Yes Marletta Lor, MD  quinapril (ACCUPRIL) 40 MG tablet TAKE ONE TABLET (40MG  TOTAL)  BY MOUTH DAILY 11/20/15  Yes Marletta Lor, MD  ibuprofen (ADVIL,MOTRIN) 200 MG tablet Take 600 mg by mouth every 6 (six) hours as needed for moderate  pain.     Historical Provider, MD  sulfamethoxazole-trimethoprim (BACTRIM DS,SEPTRA DS) 800-160 MG tablet Take 1 tablet by mouth 2 (two) times daily. Patient not taking: Reported on 12/28/2015 12/20/15   Brunetta Jeans, PA-C   BP 133/83 mmHg  Pulse 54  Temp(Src) 98.5 F (36.9 C) (Oral)  Resp 20  Ht 6' (1.829 m)  Wt 110.224 kg  BMI 32.95 kg/m2  SpO2 98% Physical Exam  Constitutional: He is oriented to person, place, and time. He appears well-developed and well-nourished. No distress.  HENT:  Head: Normocephalic and atraumatic.  Right Ear: External ear normal.  Left Ear: External ear normal.  Nose: Nose normal.  Mouth/Throat: Oropharynx is clear and moist.  Eyes: Conjunctivae and EOM are normal. Pupils are equal, round, and reactive to light. No scleral icterus.  Neck: Normal range of motion. Neck supple. No JVD present. No tracheal deviation present. No thyromegaly present.  Cardiovascular: Intact distal pulses.   Regular slightly bradycardic rhythm to high 50's  Pulmonary/Chest: Effort normal and breath sounds normal. No stridor. No respiratory distress. He has no wheezes. He has no rales.  Lung sounds clear, no crackles or signs of fluid  Abdominal: Soft. He exhibits no distension. There is no tenderness. There is no rebound and no guarding.  Musculoskeletal: Normal range of motion. He exhibits no edema (no edema appreciated on exam) or tenderness.  Neurological: He is alert and oriented to person, place, and time. No cranial nerve deficit. He exhibits normal muscle tone. Coordination normal.  5/5 strength in all 4 extremities. Normal Gait.   Skin: Skin is warm and dry. No rash noted. He is not diaphoretic.  Psychiatric: He has a normal mood and affect. His behavior is normal.  Nursing note and vitals reviewed.   ED Course  Procedures (including critical care time) Labs Review Labs Reviewed  URINALYSIS, ROUTINE W REFLEX MICROSCOPIC (NOT AT [facility]) - Abnormal; Notable for the  following:    Leukocytes, UA TRACE (*)    All other components within normal limits  CBC WITH DIFFERENTIAL/PLATELET - Abnormal; Notable for the following:    Lymphs Abs 0.6 (*)    All other components within normal limits  BASIC METABOLIC PANEL - Abnormal; Notable for the following:    CO2 20 (*)    Glucose, Bld 102 (*)    Creatinine, Ser 1.51 (*)    GFR calc non Af Amer 43 (*)    GFR calc Af Amer 50 (*)    All other components within normal limits  URINE MICROSCOPIC-ADD ON - Abnormal; Notable for the following:    Squamous Epithelial / LPF 0-5 (*)    All other components within normal limits  I-STAT CG4 LACTIC ACID, ED - Abnormal; Notable for the following:    Lactic Acid, Venous 2.07 (*)    All other components within normal limits  I-STAT CG4 LACTIC ACID, ED    Imaging Review Dg Chest 2 View  12/28/2015  CLINICAL DATA:  Cough, chest congestion and fatigue for the past 2 days.  Recently discharged from the hospital after being treated for sepsis. EXAM: CHEST  2 VIEW COMPARISON:  11/13/2015. FINDINGS: Poor inspiration. Borderline enlarged cardiac silhouette with an interval decrease in size. Decreased prominence of the pulmonary vasculature and interstitial markings. The lungs are clear with normal vascularity today. Minimal central peribronchial thickening is unchanged. A moderately large hiatal hernia is noted. Thoracic spine degenerative changes, including changes of DISH. IMPRESSION: 1. Minimal bronchitic changes, unchanged. 2. Moderately large hiatal hernia. Electronically Signed   By: Claudie Revering M.D.   On: 12/28/2015 14:27   I have personally reviewed and evaluated these images and lab results as part of my medical decision-making.   EKG Interpretation None      MDM   Final diagnoses:  None    The patient is a 75 year old male with a previous history of melanoma status post surgical excision not currently on any chemotherapy as well as recent hospitalization for sepsis  of unknown cause 1-1/2 months ago and recent UTI treated as an outpatient one week ago. He reports his dysuria has completely resolved. He presents today for 3 days of URI symptoms of nonproductive cough, fatigue, runny nose and nasal congestion. Of note his wife has the same symptoms. No fevers or chills and patient is tolerating by mouth well. Patient nontoxic on exam. Laboratory evaluation shows normal Branscomb blood cell count, no fever, normal blood pressure and respiratory rate. Do not feel patient has sepsis at this time. Lactic acid mildly elevated to 2.07. Patient was given 1 L of normal saline and lactic acid is normal on recheck. Patient is not clinically fluid overloaded and do not feel patient has CHF exacerbation at this time. Feel patient is appropriate for discharge with strict E return precautions for fevers or worsening of symptoms or other signs of systemic infection. Patient's versus understanding and agreement with this plan is discharge home primary care follow-up.  Patient seen with attending, Dr. Reather Converse, who oversaw clinical decision making.     Margaretann Loveless, MD 12/28/15 1705  Elnora Morrison, MD 12/29/15 [phone]

## 2015-12-28 NOTE — ED Notes (Signed)
Pt ambulates independently and with steady gait at time of discharge. Discharge instructions and follow up information reviewed with patient. No other questions or concerns voiced at this time.  

## 2015-12-28 NOTE — ED Notes (Signed)
Presents with cough, congestion, fatigue began 2 days ago. Pt was recently d/c from hospital with sepsis and 10 days ago he had a UTI, finished Bactrim 7 day therapy yesterday. Dr. Thereasa Solo told pt to come up here if feeling run down at all. He denies fevers and chills, denies nausea and vomiting. deneis confusion.  Alert, oriented. Wife is sick with same, cough, congestion and fatigue. Breath sounds clear. Coughing clear mucous at times.

## 2015-12-28 NOTE — Discharge Instructions (Signed)

## 2015-12-30 ENCOUNTER — Encounter: Payer: Self-pay | Admitting: Internal Medicine

## 2015-12-30 ENCOUNTER — Ambulatory Visit (INDEPENDENT_AMBULATORY_CARE_PROVIDER_SITE_OTHER): Payer: Medicare Other | Admitting: Internal Medicine

## 2015-12-30 VITALS — BP 112/70 | HR 68 | Temp 98.0°F | Resp 20 | Ht 72.0 in | Wt 239.0 lb

## 2015-12-30 DIAGNOSIS — I2510 Atherosclerotic heart disease of native coronary artery without angina pectoris: Secondary | ICD-10-CM

## 2015-12-30 DIAGNOSIS — I10 Essential (primary) hypertension: Secondary | ICD-10-CM | POA: Diagnosis not present

## 2015-12-30 DIAGNOSIS — I152 Hypertension secondary to endocrine disorders: Secondary | ICD-10-CM

## 2015-12-30 DIAGNOSIS — E1159 Type 2 diabetes mellitus with other circulatory complications: Secondary | ICD-10-CM | POA: Diagnosis not present

## 2015-12-30 NOTE — Progress Notes (Signed)
Subjective:    Patient ID: Brent Taylor, male    DOB: Jan 05, 1941, 75 y.o.   MRN: [id number]  HPI 75 year old patient who is seen following a recent ED visit 2 days ago he was treated for a viral URI with cough.  ED records reviewed.  This included lab and a chest x-ray.  He is modestly improved but still slightly weak.  His wife has had a similar illness.  Patient denies any fever, productive cough or shortness of breath Chronic medical problems include a history of diastolic heart failure, hypertension and diabetes  Past Medical History  Diagnosis Date  . Cancer (HCC)     hx - melanoma  . Depression     No need for therapy.  Improved  . Hyperlipidemia   . Hypertension   . UTI (lower urinary tract infection)   . Colon polyps   . Diverticula, colon   . Chicken pox   . Hemorrhoids   . Sepsis [facility])     Social History   Social History  . Marital Status: Married    Spouse Name: N/A  . Number of Children: 2  . Years of Education: N/A   Occupational History  . Minister    Social History Main Topics  . Smoking status: Never Smoker   . Smokeless tobacco: Never Used  . Alcohol Use: No  . Drug Use: No  . Sexual Activity: Not on file   Other Topics Concern  . Not on file   Social History Narrative   Lives with wife.      Past Surgical History  Procedure Laterality Date  . Melanoma excision      LEFT CHEST  . Melanoma excision      Family History  Problem Relation Age of Onset  . Heart Problems Mother     Skipping    Allergies  Allergen Reactions  . Lipitor [Atorvastatin Calcium]     Muscle ache    Current Outpatient Prescriptions on File Prior to Visit  Medication Sig Dispense Refill  . amLODipine (NORVASC) 10 MG tablet Take 1 tablet (10 mg total) by mouth daily. 90 tablet 1  . benzonatate (TESSALON PERLES) 100 MG capsule Take 1 capsule (100 mg total) by mouth 3 (three) times daily as needed for cough. 10 capsule 0  . clopidogrel (PLAVIX) 75 MG tablet TAKE  ONE TABLET (75MG  TOTAL) BY MOUTH DAILY 90 tablet 1  . esomeprazole (NEXIUM) 20 MG packet Take 20 mg by mouth daily before breakfast. OTC    . ibuprofen (ADVIL,MOTRIN) 200 MG tablet Take 600 mg by mouth every 6 (six) hours as needed for moderate pain.     . metFORMIN (GLUCOPHAGE) 500 MG tablet TAKE ONE TABLET (50OMG TOTAL) BY MOUTH DAILY WITH BREAKFAST 30 tablet 5  . metoprolol tartrate (LOPRESSOR) 25 MG tablet Take 1 tablet (25 mg total) by mouth 2 (two) times daily. 180 tablet 1  . pravastatin (PRAVACHOL) 40 MG tablet TAKE 1 TABLET (40 MG TOTAL) BY MOUTH DAILY. (Patient taking differently: TAKE 1 TABLET (40 MG TOTAL) BY every evening) 90 tablet 1  . quinapril (ACCUPRIL) 40 MG tablet TAKE ONE TABLET (40MG  TOTAL)  BY MOUTH DAILY 90 tablet 1   No current facility-administered medications on file prior to visit.    BP 112/70 mmHg  Pulse 68  Temp(Src) 98 F (36.7 C) (Oral)  Resp 20  Ht 6' (1.829 m)  Wt 239 lb (108.41 kg)  BMI 32.41 kg/m2  SpO2 97%  Review of Systems  Constitutional: Positive for activity change, appetite change and fatigue. Negative for fever and chills.  HENT: Positive for congestion. Negative for dental problem, ear pain, hearing loss, sore throat, tinnitus, trouble swallowing and voice change.   Eyes: Negative for pain, discharge and visual disturbance.  Respiratory: Positive for cough. Negative for chest tightness, wheezing and stridor.   Cardiovascular: Negative for chest pain, palpitations and leg swelling.  Gastrointestinal: Negative for nausea, vomiting, abdominal pain, diarrhea, constipation, blood in stool and abdominal distention.  Genitourinary: Negative for urgency, hematuria, flank pain, discharge, difficulty urinating and genital sores.  Musculoskeletal: Negative for myalgias, back pain, joint swelling, arthralgias, gait problem and neck stiffness.  Skin: Negative for rash.  Neurological: Negative for dizziness, syncope, speech difficulty, weakness,  numbness and headaches.  Hematological: Negative for adenopathy. Does not bruise/bleed easily.  Psychiatric/Behavioral: Negative for behavioral problems and dysphoric mood. The patient is not nervous/anxious.        Objective:   Physical Exam  Constitutional: He is oriented to person, place, and time. He appears well-developed and well-nourished. No distress.  HENT:  Head: Normocephalic.  Right Ear: External ear normal.  Left Ear: External ear normal.  Eyes: Conjunctivae and EOM are normal.  Neck: Normal range of motion.  Cardiovascular: Normal rate, regular rhythm and normal heart sounds.   Pulmonary/Chest: Effort normal and breath sounds normal. No respiratory distress. He has no wheezes. He has no rales.  O2 saturation 97%  Abdominal: Bowel sounds are normal.  Musculoskeletal: Normal range of motion. He exhibits no edema or tenderness.  Neurological: He is alert and oriented to person, place, and time.  Psychiatric: He has a normal mood and affect. His behavior is normal.          Assessment & Plan:   Viral URI, improved Diabetes mellitus.  Remains stable.  Reassess in August as scheduled Hypertension, stable  No change in therapy Patient will report any new symptoms or clinical worsening

## 2015-12-30 NOTE — Progress Notes (Signed)
Pre visit review using our clinic review tool, if applicable. No additional management support is needed unless otherwise documented below in the visit note. 

## 2016-01-12 ENCOUNTER — Other Ambulatory Visit: Payer: Self-pay | Admitting: Internal Medicine

## 2016-01-12 ENCOUNTER — Encounter: Payer: Medicare Other | Admitting: Internal Medicine

## 2016-02-03 ENCOUNTER — Ambulatory Visit: Payer: Self-pay | Admitting: Internal Medicine

## 2016-02-04 ENCOUNTER — Encounter: Payer: Self-pay | Admitting: Internal Medicine

## 2016-02-04 ENCOUNTER — Ambulatory Visit (INDEPENDENT_AMBULATORY_CARE_PROVIDER_SITE_OTHER): Payer: Medicare Other | Admitting: Internal Medicine

## 2016-02-04 VITALS — BP 130/80 | HR 45 | Temp 98.3°F | Resp 20 | Ht 72.0 in | Wt 242.0 lb

## 2016-02-04 DIAGNOSIS — I10 Essential (primary) hypertension: Secondary | ICD-10-CM | POA: Diagnosis not present

## 2016-02-04 DIAGNOSIS — I2510 Atherosclerotic heart disease of native coronary artery without angina pectoris: Secondary | ICD-10-CM | POA: Diagnosis not present

## 2016-02-04 DIAGNOSIS — I152 Hypertension secondary to endocrine disorders: Secondary | ICD-10-CM

## 2016-02-04 DIAGNOSIS — E785 Hyperlipidemia, unspecified: Secondary | ICD-10-CM

## 2016-02-04 DIAGNOSIS — E1159 Type 2 diabetes mellitus with other circulatory complications: Secondary | ICD-10-CM

## 2016-02-04 LAB — BASIC METABOLIC PANEL
BUN: 22 mg/dL (ref 6–23)
CALCIUM: 9.4 mg/dL (ref 8.4–10.5)
CO2: 29 mEq/L (ref 19–32)
CREATININE: 1.12 mg/dL (ref 0.40–1.50)
Chloride: 105 mEq/L (ref 96–112)
GFR: 67.53 mL/min (ref >60.00)
GLUCOSE: 122 mg/dL — AB (ref 70–99)
Potassium: 4.4 mEq/L (ref 3.5–5.1)
Sodium: 141 mEq/L (ref 135–145)

## 2016-02-04 NOTE — Patient Instructions (Signed)
Limit your sodium (Salt) intake  Please check your blood pressure on a regular basis.  If it is consistently greater than 150/90, please make an office appointment.  Call or return to clinic prn if these symptoms worsen or fail to improve as anticipated.  

## 2016-02-04 NOTE — Progress Notes (Signed)
Subjective:    Patient ID: Brent Taylor, male    DOB: 10-Jun-1941, 75 y.o.   MRN: [id number]  HPI  Lab Results  Component Value Date   HGBA1C 6.5 11/20/2015   75 year old patient who presents today with a chief complaint of pedal edema.  It was bothersome over the weekend.  Swelling has largely resolved.  He was admitted hospital for acute illness, early year and was concerned about an early sepsis syndrome.  He generally feels well He does have a history of systolic dysfunction with an ejection fraction of 40-45%.  Denies any symptoms of heart failure other than the pedal edema He was seen in the ED last month with an acute febrile illness.  Creatinine had increased from 1.13 to 1.51 in the setting of acute illness  Past Medical History  Diagnosis Date  . Cancer (HCC)     hx - melanoma  . Depression     No need for therapy.  Improved  . Hyperlipidemia   . Hypertension   . UTI (lower urinary tract infection)   . Colon polyps   . Diverticula, colon   . Chicken pox   . Hemorrhoids   . Sepsis [facility])      Social History   Social History  . Marital Status: Married    Spouse Name: N/A  . Number of Children: 2  . Years of Education: N/A   Occupational History  . Minister    Social History Main Topics  . Smoking status: Never Smoker   . Smokeless tobacco: Never Used  . Alcohol Use: No  . Drug Use: No  . Sexual Activity: Not on file   Other Topics Concern  . Not on file   Social History Narrative   Lives with wife.      Past Surgical History  Procedure Laterality Date  . Melanoma excision      LEFT CHEST  . Melanoma excision      Family History  Problem Relation Age of Onset  . Heart Problems Mother     Skipping    Allergies  Allergen Reactions  . Lipitor [Atorvastatin Calcium]     Muscle ache    Current Outpatient Prescriptions on File Prior to Visit  Medication Sig Dispense Refill  . amLODipine (NORVASC) 10 MG tablet Take 1 tablet (10 mg total) by  mouth daily. 90 tablet 1  . clopidogrel (PLAVIX) 75 MG tablet TAKE ONE TABLET (75MG TOTAL) BY MOUTH DAILY 90 tablet 1  . esomeprazole (NEXIUM) 20 MG packet Take 20 mg by mouth daily before breakfast. OTC    . ibuprofen (ADVIL,MOTRIN) 200 MG tablet Take 600 mg by mouth every 6 (six) hours as needed for moderate pain.     . metFORMIN (GLUCOPHAGE) 500 MG tablet TAKE ONE TABLET (50OMG TOTAL) BY MOUTH DAILY WITH BREAKFAST 30 tablet 5  . metoprolol tartrate (LOPRESSOR) 25 MG tablet Take 1 tablet (25 mg total) by mouth 2 (two) times daily. 180 tablet 1  . pravastatin (PRAVACHOL) 40 MG tablet TAKE 1 TABLET (40 MG TOTAL) BY MOUTH DAILY. (Patient taking differently: TAKE 1 TABLET (40 MG TOTAL) BY every evening) 90 tablet 1  . quinapril (ACCUPRIL) 40 MG tablet TAKE ONE TABLET (40MG TOTAL)  BY MOUTH DAILY 90 tablet 1   No current facility-administered medications on file prior to visit.    BP 130/80 mmHg  Pulse 45  Temp(Src) 98.3 F (36.8 C) (Oral)  Resp 20  Ht 6' (1.829 m)  Wt 242  lb (109.77 kg)  BMI 32.81 kg/m2  SpO2 98%    Review of Systems  Constitutional: Negative for fever, chills, appetite change and fatigue.  HENT: Negative for congestion, dental problem, ear pain, hearing loss, sore throat, tinnitus, trouble swallowing and voice change.   Eyes: Negative for pain, discharge and visual disturbance.  Respiratory: Negative for cough, chest tightness, wheezing and stridor.   Cardiovascular: Positive for leg swelling. Negative for chest pain and palpitations.  Gastrointestinal: Negative for nausea, vomiting, abdominal pain, diarrhea, constipation, blood in stool and abdominal distention.  Genitourinary: Negative for urgency, hematuria, flank pain, discharge, difficulty urinating and genital sores.  Musculoskeletal: Negative for myalgias, back pain, joint swelling, arthralgias, gait problem and neck stiffness.  Skin: Negative for rash.  Neurological: Negative for dizziness, syncope, speech  difficulty, weakness, numbness and headaches.  Hematological: Negative for adenopathy. Does not bruise/bleed easily.  Psychiatric/Behavioral: Negative for behavioral problems and dysphoric mood. The patient is not nervous/anxious.        Objective:   Physical Exam  Constitutional: He is oriented to person, place, and time. He appears well-developed.  HENT:  Head: Normocephalic.  Right Ear: External ear normal.  Left Ear: External ear normal.  Eyes: Conjunctivae and EOM are normal.  Neck: Normal range of motion.  Cardiovascular: Normal rate and normal heart sounds.   Rate about 60 with frequent ectopics  Pulmonary/Chest: Breath sounds normal. He has no rales.  Abdominal: Bowel sounds are normal.  Musculoskeletal: Normal range of motion. He exhibits no edema or tenderness.  No pedal edema  Neurological: He is alert and oriented to person, place, and time.  Psychiatric: He has a normal mood and affect. His behavior is normal.          Assessment & Plan:   Pedal edema.  Now resolved, probably in part related to amlodipine Systolic dysfunction.  Low-salt diet.  Encouraged appears euvolemic.  No indication for diuretic therapy at this time History of elevated creatinine in the setting of acute illness and volume depletion.  Will repeat be met  Patient will call if any further swelling or fever develops any new symptoms, such as DOE

## 2016-02-04 NOTE — Progress Notes (Signed)
Pre visit review using our clinic review tool, if applicable. No additional management support is needed unless otherwise documented below in the visit note. 

## 2016-02-07 ENCOUNTER — Other Ambulatory Visit: Payer: Self-pay | Admitting: Internal Medicine

## 2016-02-13 ENCOUNTER — Encounter: Payer: Self-pay | Admitting: Internal Medicine

## 2016-02-23 ENCOUNTER — Other Ambulatory Visit: Payer: Self-pay

## 2016-02-24 ENCOUNTER — Other Ambulatory Visit: Payer: Self-pay | Admitting: *Deleted

## 2016-02-24 MED ORDER — PRAVASTATIN SODIUM 40 MG PO TABS: ORAL_TABLET | ORAL | Status: DC

## 2016-02-28 ENCOUNTER — Other Ambulatory Visit: Payer: Self-pay | Admitting: Internal Medicine

## 2016-03-01 ENCOUNTER — Other Ambulatory Visit: Payer: Self-pay

## 2016-03-01 MED ORDER — PRAVASTATIN SODIUM 40 MG PO TABS: ORAL_TABLET | ORAL | Status: DC

## 2016-03-23 ENCOUNTER — Telehealth: Payer: Self-pay | Admitting: General Practice

## 2016-03-23 NOTE — Telephone Encounter (Signed)
Patient walked into office today stating that on Friday evening he began having diarrhea (which has cleared up), intermittent nausea and feeling sluggish.  Appetite is OK and non-febrile.  120/70, HR - 67, Temp. 98.2 and O2 - 93%.  Symptoms were presented to Dr. Burnice Logan and he said to give it another few days and if symptoms don't improve or get worse to call office and make an appointment.  Patient verbalized instructions and thanked me for seeing him.

## 2016-04-25 ENCOUNTER — Emergency Department (HOSPITAL_COMMUNITY)
Admission: EM | Admit: 2016-04-25 | Discharge: 2016-04-25 | Disposition: A | Discharge status: Home / Self Care | Payer: Medicare Other | Attending: Emergency Medicine | Admitting: Emergency Medicine

## 2016-04-25 ENCOUNTER — Encounter (HOSPITAL_COMMUNITY): Payer: Self-pay | Admitting: *Deleted

## 2016-04-25 DIAGNOSIS — I10 Essential (primary) hypertension: Secondary | ICD-10-CM | POA: Insufficient documentation

## 2016-04-25 DIAGNOSIS — R197 Diarrhea, unspecified: Secondary | ICD-10-CM | POA: Diagnosis not present

## 2016-04-25 DIAGNOSIS — Z85820 Personal history of malignant melanoma of skin: Secondary | ICD-10-CM | POA: Diagnosis not present

## 2016-04-25 DIAGNOSIS — Z7984 Long term (current) use of oral hypoglycemic drugs: Secondary | ICD-10-CM | POA: Diagnosis not present

## 2016-04-25 DIAGNOSIS — R110 Nausea: Secondary | ICD-10-CM | POA: Diagnosis not present

## 2016-04-25 DIAGNOSIS — Z79899 Other long term (current) drug therapy: Secondary | ICD-10-CM | POA: Diagnosis not present

## 2016-04-25 LAB — URINALYSIS, ROUTINE W REFLEX MICROSCOPIC
BILIRUBIN URINE: NEGATIVE
Glucose, UA: NEGATIVE mg/dL
Hgb urine dipstick: NEGATIVE
Ketones, ur: NEGATIVE mg/dL
LEUKOCYTES UA: NEGATIVE
NITRITE: NEGATIVE
Protein, ur: NEGATIVE mg/dL
SPECIFIC GRAVITY, URINE: 1.019 (ref 1.005–1.030)
pH: 6 (ref 5.0–8.0)

## 2016-04-25 LAB — COMPREHENSIVE METABOLIC PANEL
ALK PHOS: 58 U/L (ref 38–126)
ALT: 25 U/L (ref 17–63)
ANION GAP: 8 (ref 5–15)
AST: 29 U/L (ref 15–41)
Albumin: 4.1 g/dL (ref 3.5–5.0)
BILIRUBIN TOTAL: 1 mg/dL (ref 0.3–1.2)
BUN: 19 mg/dL (ref 6–20)
CALCIUM: 9.3 mg/dL (ref 8.9–10.3)
CO2: 21 mmol/L — ABNORMAL LOW (ref 22–32)
Chloride: 109 mmol/L (ref 101–111)
Creatinine, Ser: 1.15 mg/dL (ref 0.61–1.24)
GFR calc non Af Amer: 60 mL/min — ABNORMAL LOW (ref >60)
Glucose, Bld: 129 mg/dL — ABNORMAL HIGH (ref 65–99)
POTASSIUM: 4.4 mmol/L (ref 3.5–5.1)
Sodium: 138 mmol/L (ref 135–145)
TOTAL PROTEIN: 7.4 g/dL (ref 6.5–8.1)

## 2016-04-25 LAB — CBC
HEMATOCRIT: 52.6 % — AB (ref 39.0–52.0)
HEMOGLOBIN: 16.9 g/dL (ref 13.0–17.0)
MCH: 28.4 pg (ref 26.0–34.0)
MCHC: 32.1 g/dL (ref 30.0–36.0)
MCV: 88.4 fL (ref 78.0–100.0)
Platelets: 210 10*3/uL (ref 150–400)
RBC: 5.95 MIL/uL — AB (ref 4.22–5.81)
RDW: 14 % (ref 11.5–15.5)
WBC: 13.1 10*3/uL — AB (ref 4.0–10.5)

## 2016-04-25 LAB — I-STAT CG4 LACTIC ACID, ED
LACTIC ACID, VENOUS: 2.37 mmol/L — AB (ref 0.5–1.9)
Lactic Acid, Venous: 1.84 mmol/L (ref 0.5–1.9)

## 2016-04-25 LAB — LIPASE, BLOOD: Lipase: 36 U/L (ref 11–51)

## 2016-04-25 MED ORDER — ONDANSETRON HCL 4 MG/2ML IJ SOLN
4.0000 mg | Freq: Once | INTRAMUSCULAR | Status: DC
  Filled 2016-04-25: qty 2

## 2016-04-25 MED ORDER — ONDANSETRON 4 MG PO TBDP: 4.0000 mg | ORAL_TABLET | Freq: Three times a day (TID) | ORAL | Status: DC | PRN

## 2016-04-25 MED ORDER — ONDANSETRON 4 MG PO TBDP
ORAL_TABLET | ORAL | Status: AC
  Filled 2016-04-25: qty 1

## 2016-04-25 MED ORDER — SODIUM CHLORIDE 0.9 % IV BOLUS (SEPSIS)
500.0000 mL | Freq: Once | INTRAVENOUS | Status: AC
  Administered 2016-04-25: 500 mL via INTRAVENOUS

## 2016-04-25 MED ORDER — ONDANSETRON 4 MG PO TBDP
4.0000 mg | ORAL_TABLET | Freq: Once | ORAL | Status: AC | PRN
  Administered 2016-04-25: 4 mg via ORAL

## 2016-04-25 MED ORDER — LOPERAMIDE HCL 2 MG PO TABS: 2.0000 mg | ORAL_TABLET | Freq: Four times a day (QID) | ORAL | Status: DC | PRN

## 2016-04-25 MED ORDER — SODIUM CHLORIDE 0.9 % IV SOLN
INTRAVENOUS | Status: DC
  Administered 2016-04-25: 16:00:00 via INTRAVENOUS

## 2016-04-25 NOTE — Discharge Instructions (Signed)
Follow-up with your doctor on Tuesday as scheduled. Take Zofran as needed for nausea. Take Imodium right ear as needed for diarrhea. Return for any new or worse symptoms.

## 2016-04-25 NOTE — ED Notes (Addendum)
Pt reports having nausea and diarrhea that started today. No vomiting. Reports recent hx of sepsis in Feb.

## 2016-04-25 NOTE — ED Provider Notes (Signed)
CSN: [id number]     Arrival date & time 04/25/16  1325 History   First MD Initiated Contact with Patient 04/25/16 1510     Chief Complaint  Patient presents with  . Diarrhea  . Nausea     (Consider location/radiation/quality/duration/timing/severity/associated sxs/prior Treatment) Patient is a 75 y.o. male presenting with diarrhea. The history is provided by the patient.  Diarrhea Associated symptoms: diaphoresis   Associated symptoms: no abdominal pain, no fever, no headaches and no vomiting   Patient with acute onset of nausea and diarrhea at church today. No diarrhea since arrival here. No vomiting. No fevers. No abdominal pain. No one else sick in the family. Patient had similar episode of this lasted for 3 days in the first part of June. Patient has been on antibiotics on and off the first part of the year he was admitted in January for sepsis. Patient did not have diarrhea at that time. Patient now feeling better was given Zofran in triage. Patient denies fevers or any upper respiratory symptoms any pain no abdominal pain no chest pain no cough no rash not hurting to PEe. Patient felt fine yesterday.  Past Medical History  Diagnosis Date  . Cancer (HCC)     hx - melanoma  . Depression     No need for therapy.  Improved  . Hyperlipidemia   . Hypertension   . UTI (lower urinary tract infection)   . Colon polyps   . Diverticula, colon   . Chicken pox   . Hemorrhoids   . Sepsis [facility])    Past Surgical History  Procedure Laterality Date  . Melanoma excision      LEFT CHEST  . Melanoma excision     Family History  Problem Relation Age of Onset  . Heart Problems Mother     Skipping   Social History  Substance Use Topics  . Smoking status: Never Smoker   . Smokeless tobacco: Never Used  . Alcohol Use: No    Review of Systems  Constitutional: Positive for diaphoresis. Negative for fever.  HENT: Negative for congestion.   Eyes: Negative for visual disturbance.   Respiratory: Negative for shortness of breath.   Cardiovascular: Negative for chest pain.  Gastrointestinal: Positive for nausea and diarrhea. Negative for vomiting, abdominal pain and blood in stool.  Genitourinary: Negative for dysuria and hematuria.  Musculoskeletal: Negative for back pain.  Skin: Negative for rash.  Neurological: Negative for headaches.  Hematological: Does not bruise/bleed easily.  Psychiatric/Behavioral: Negative for confusion.      Allergies  Lipitor  Home Medications   Prior to Admission medications   Medication Sig Start Date End Date Taking? Authorizing Provider  amLODipine (NORVASC) 10 MG tablet TAKE 1 TABLET (10 MG TOTAL) BY MOUTH DAILY. 03/01/16  Yes Marletta Lor, MD  clopidogrel (PLAVIX) 75 MG tablet TAKE ONE TABLET (75MG  TOTAL) BY MOUTH DAILY 06/06/15  Yes Marletta Lor, MD  esomeprazole (NEXIUM) 20 MG packet Take 20 mg by mouth daily before breakfast. OTC   Yes Historical Provider, MD  ibuprofen (ADVIL,MOTRIN) 200 MG tablet Take 600 mg by mouth every 6 (six) hours as needed for moderate pain.    Yes Historical Provider, MD  metFORMIN (GLUCOPHAGE) 500 MG tablet TAKE ONE TABLET (50OMG TOTAL) BY MOUTH DAILY WITH BREAKFAST 09/01/15  Yes Marletta Lor, MD  metoprolol tartrate (LOPRESSOR) 25 MG tablet TAKE 1 TABLET (25 MG TOTAL) BY MOUTH 2 (TWO) TIMES DAILY. 03/01/16  Yes Marletta Lor, MD  pravastatin (PRAVACHOL) 40 MG tablet TAKE 1 TABLET (40 MG TOTAL) BY MOUTH DAILY. 03/01/16  Yes Marletta Lor, MD  quinapril (ACCUPRIL) 40 MG tablet TAKE ONE TABLET (40MG  TOTAL)  BY MOUTH DAILY 03/01/16  Yes Marletta Lor, MD  loperamide (IMODIUM A-D) 2 MG tablet Take 1 tablet (2 mg total) by mouth 4 (four) times daily as needed for diarrhea or loose stools. 04/25/16   Fredia Sorrow, MD  ondansetron (ZOFRAN ODT) 4 MG disintegrating tablet Take 1 tablet (4 mg total) by mouth every 8 (eight) hours as needed for nausea or vomiting. 04/25/16    Fredia Sorrow, MD   BP 129/89 mmHg  Pulse 77  Temp(Src) 98.1 F (36.7 C) (Oral)  Resp 18  SpO2 96% Physical Exam  Constitutional: He is oriented to person, place, and time. He appears well-developed and well-nourished. No distress.  HENT:  Head: Normocephalic and atraumatic.  Mouth/Throat: Oropharynx is clear and moist.  Eyes: Conjunctivae and EOM are normal. Pupils are equal, round, and reactive to light.  Neck: Normal range of motion. Neck supple.  Cardiovascular: Normal rate, regular rhythm and normal heart sounds.   No murmur heard. Pulmonary/Chest: Effort normal and breath sounds normal.  Abdominal: Soft. Bowel sounds are normal. There is no tenderness.  Musculoskeletal: Normal range of motion.  Neurological: He is alert and oriented to person, place, and time. No cranial nerve deficit. He exhibits normal muscle tone. Coordination normal.  Skin: Skin is warm. No rash noted.  Nursing note and vitals reviewed.   ED Course  Procedures (including critical care time) Labs Review Labs Reviewed  COMPREHENSIVE METABOLIC PANEL - Abnormal; Notable for the following:    CO2 21 (*)    Glucose, Bld 129 (*)    GFR calc non Af Amer 60 (*)    All other components within normal limits  CBC - Abnormal; Notable for the following:    WBC 13.1 (*)    RBC 5.95 (*)    HCT 52.6 (*)    All other components within normal limits  URINALYSIS, ROUTINE W REFLEX MICROSCOPIC (NOT AT [facility]) - Abnormal; Notable for the following:    APPearance CLOUDY (*)    All other components within normal limits  I-STAT CG4 LACTIC ACID, ED - Abnormal; Notable for the following:    Lactic Acid, Venous 2.37 (*)    All other components within normal limits  LIPASE, BLOOD  I-STAT CG4 LACTIC ACID, ED   Results for orders placed or performed during the hospital encounter of 04/25/16  Lipase, blood  Result Value Ref Range   Lipase 36 11 - 51 U/L  Comprehensive metabolic panel  Result Value Ref Range   Sodium  138 135 - 145 mmol/L   Potassium 4.4 3.5 - 5.1 mmol/L   Chloride 109 101 - 111 mmol/L   CO2 21 (L) 22 - 32 mmol/L   Glucose, Bld 129 (H) 65 - 99 mg/dL   BUN 19 6 - 20 mg/dL   Creatinine, Ser 1.15 0.61 - 1.24 mg/dL   Calcium 9.3 8.9 - 10.3 mg/dL   Total Protein 7.4 6.5 - 8.1 g/dL   Albumin 4.1 3.5 - 5.0 g/dL   AST 29 15 - 41 U/L   ALT 25 17 - 63 U/L   Alkaline Phosphatase 58 38 - 126 U/L   Total Bilirubin 1.0 0.3 - 1.2 mg/dL   GFR calc non Af Amer 60 (L) >60 mL/min   GFR calc Af Amer >60 >60 mL/min  Anion gap 8 5 - 15  CBC  Result Value Ref Range   WBC 13.1 (H) 4.0 - 10.5 K/uL   RBC 5.95 (H) 4.22 - 5.81 MIL/uL   Hemoglobin 16.9 13.0 - 17.0 g/dL   HCT 52.6 (H) 39.0 - 52.0 %   MCV 88.4 78.0 - 100.0 fL   MCH 28.4 26.0 - 34.0 pg   MCHC 32.1 30.0 - 36.0 g/dL   RDW 14.0 11.5 - 15.5 %   Platelets 210 150 - 400 K/uL  Urinalysis, Routine w reflex microscopic  Result Value Ref Range   Color, Urine YELLOW YELLOW   APPearance CLOUDY (A) CLEAR   Specific Gravity, Urine 1.019 1.005 - 1.030   pH 6.0 5.0 - 8.0   Glucose, UA NEGATIVE NEGATIVE mg/dL   Hgb urine dipstick NEGATIVE NEGATIVE   Bilirubin Urine NEGATIVE NEGATIVE   Ketones, ur NEGATIVE NEGATIVE mg/dL   Protein, ur NEGATIVE NEGATIVE mg/dL   Nitrite NEGATIVE NEGATIVE   Leukocytes, UA NEGATIVE NEGATIVE  I-Stat CG4 Lactic Acid, ED  Result Value Ref Range   Lactic Acid, Venous 2.37 (HH) 0.5 - 1.9 mmol/L   Comment NOTIFIED PHYSICIAN   I-Stat CG4 Lactic Acid, ED  Result Value Ref Range   Lactic Acid, Venous 1.84 0.5 - 1.9 mmol/L     Imaging Review No results found. I have personally reviewed and evaluated these images and lab results as part of my medical decision-making.   EKG Interpretation None      MDM   Final diagnoses:  Diarrhea, unspecified type    Patient with acute onset today of nausea and diarrhea no blood. No vomiting. Patient has had no diarrhea episodes since arriving here. Nausea is well controlled.  Patient had similar episode beginning of June not clear what the cause is. Patient was admitted in January for sepsis but did not have diarrhea at that time. Patient has been on antibiotics throughout the first part of the year. Is possible that there could be some change in his gut flora that's responsible for this. Patient's abdomen is soft tender no evidence of any acute surgical abdomen. Initial lactic acid was slightly elevated at 2.37. Repeat is now 1.84 after fluids. Patient feeling fine. Patient nontoxic no acute distress. Patient without any vital signs concerning for sepsis note fever.    Fredia Sorrow, MD 04/25/16 1840

## 2016-04-25 NOTE — ED Notes (Signed)
Charge Nurse Mali G. Informed of pt Istat lab results.

## 2016-04-27 ENCOUNTER — Encounter: Payer: Self-pay | Admitting: Internal Medicine

## 2016-04-27 ENCOUNTER — Ambulatory Visit (INDEPENDENT_AMBULATORY_CARE_PROVIDER_SITE_OTHER): Payer: Medicare Other | Admitting: Internal Medicine

## 2016-04-27 VITALS — BP 120/70 | HR 56 | Temp 97.9°F | Ht 72.0 in | Wt 240.0 lb

## 2016-04-27 DIAGNOSIS — I10 Essential (primary) hypertension: Secondary | ICD-10-CM

## 2016-04-27 DIAGNOSIS — I2510 Atherosclerotic heart disease of native coronary artery without angina pectoris: Secondary | ICD-10-CM

## 2016-04-27 DIAGNOSIS — E785 Hyperlipidemia, unspecified: Secondary | ICD-10-CM | POA: Diagnosis not present

## 2016-04-27 DIAGNOSIS — E1159 Type 2 diabetes mellitus with other circulatory complications: Secondary | ICD-10-CM | POA: Diagnosis not present

## 2016-04-27 DIAGNOSIS — I152 Hypertension secondary to endocrine disorders: Secondary | ICD-10-CM

## 2016-04-27 LAB — HEMOGLOBIN A1C: HEMOGLOBIN A1C: 6.1 % (ref 4.6–6.5)

## 2016-04-27 MED ORDER — ONDANSETRON 4 MG PO TBDP: 4.0000 mg | ORAL_TABLET | Freq: Three times a day (TID) | ORAL | Status: DC | PRN

## 2016-04-27 MED ORDER — AMLODIPINE BESYLATE 5 MG PO TABS: 5.0000 mg | ORAL_TABLET | Freq: Every day | ORAL | Status: DC

## 2016-04-27 NOTE — Progress Notes (Signed)
Pre visit review using our clinic review tool, if applicable. No additional management support is needed unless otherwise documented below in the visit note. 

## 2016-04-27 NOTE — Patient Instructions (Signed)
Align  1 tablet daily  Slowly advance your diet and activity level  Decrease amlodipine to 5 mg daily   Please check your hemoglobin A1c every 3 months

## 2016-04-27 NOTE — Progress Notes (Signed)
Subjective:    Patient ID: Brent Taylor, male    DOB: 03/25/1941, 75 y.o.   MRN: [id number]  HPI  Lab Results  Component Value Date   HGBA1C 6.5 11/20/2015   75 year old patient who has hypertension and type 2 diabetes. He has had a difficult year.  He was admitted earlier in the year with a sepsis syndrome and had some antibiotic associated diarrhea.  3 days ago he was seen in the ED with nausea, vomiting, and weakness following antibiotic use for a dental infection.  The diarrhea has improved.  He still feels a bit weak, but seems to be improving.  No fever or chills. His diabetes has been well controlled Medical regimen includes amlodipine 10 mg daily.  He has had some pedal edema  ED records reviewed  Past Medical History  Diagnosis Date  . Cancer (HCC)     hx - melanoma  . Depression     No need for therapy.  Improved  . Hyperlipidemia   . Hypertension   . UTI (lower urinary tract infection)   . Colon polyps   . Diverticula, colon   . Chicken pox   . Hemorrhoids   . Sepsis [facility])      Social History   Social History  . Marital Status: Married    Spouse Name: N/A  . Number of Children: 2  . Years of Education: N/A   Occupational History  . Minister    Social History Main Topics  . Smoking status: Never Smoker   . Smokeless tobacco: Never Used  . Alcohol Use: No  . Drug Use: No  . Sexual Activity: Not on file   Other Topics Concern  . Not on file   Social History Narrative   Lives with wife.      Past Surgical History  Procedure Laterality Date  . Melanoma excision      LEFT CHEST  . Melanoma excision      Family History  Problem Relation Age of Onset  . Heart Problems Mother     Skipping    Allergies  Allergen Reactions  . Lipitor [Atorvastatin Calcium]     Muscle ache    Current Outpatient Prescriptions on File Prior to Visit  Medication Sig Dispense Refill  . amLODipine (NORVASC) 10 MG tablet TAKE 1 TABLET (10 MG TOTAL) BY MOUTH  DAILY. 90 tablet 1  . clopidogrel (PLAVIX) 75 MG tablet TAKE ONE TABLET (75MG  TOTAL) BY MOUTH DAILY 90 tablet 1  . esomeprazole (NEXIUM) 20 MG packet Take 20 mg by mouth daily before breakfast. OTC    . ibuprofen (ADVIL,MOTRIN) 200 MG tablet Take 600 mg by mouth every 6 (six) hours as needed for moderate pain.     . metFORMIN (GLUCOPHAGE) 500 MG tablet TAKE ONE TABLET (50OMG TOTAL) BY MOUTH DAILY WITH BREAKFAST 30 tablet 5  . metoprolol tartrate (LOPRESSOR) 25 MG tablet TAKE 1 TABLET (25 MG TOTAL) BY MOUTH 2 (TWO) TIMES DAILY. 180 tablet 1  . pravastatin (PRAVACHOL) 40 MG tablet TAKE 1 TABLET (40 MG TOTAL) BY MOUTH DAILY. 90 tablet 1  . quinapril (ACCUPRIL) 40 MG tablet TAKE ONE TABLET (40MG  TOTAL)  BY MOUTH DAILY 90 tablet 1  . loperamide (IMODIUM A-D) 2 MG tablet Take 1 tablet (2 mg total) by mouth 4 (four) times daily as needed for diarrhea or loose stools. (Patient not taking: Reported on 04/27/2016) 30 tablet 0  . ondansetron (ZOFRAN ODT) 4 MG disintegrating tablet Take 1 tablet (4  mg total) by mouth every 8 (eight) hours as needed for nausea or vomiting. (Patient not taking: Reported on 04/27/2016) 12 tablet 1   No current facility-administered medications on file prior to visit.    BP 120/70 mmHg  Pulse 56  Temp(Src) 97.9 F (36.6 C) (Oral)  Ht 6' (1.829 m)  Wt 240 lb (108.863 kg)  BMI 32.54 kg/m2  SpO2 96%     Review of Systems  Constitutional: Positive for diaphoresis, activity change, appetite change and fatigue. Negative for fever and chills.  HENT: Negative for congestion, dental problem, ear pain, hearing loss, sore throat, tinnitus, trouble swallowing and voice change.   Eyes: Negative for pain, discharge and visual disturbance.  Respiratory: Negative for cough, chest tightness, wheezing and stridor.   Cardiovascular: Negative for chest pain, palpitations and leg swelling.  Gastrointestinal: Positive for nausea and diarrhea. Negative for vomiting, abdominal pain,  constipation, blood in stool and abdominal distention.  Genitourinary: Negative for urgency, hematuria, flank pain, discharge, difficulty urinating and genital sores.  Musculoskeletal: Negative for myalgias, back pain, joint swelling, arthralgias, gait problem and neck stiffness.  Skin: Negative for rash.  Neurological: Positive for weakness. Negative for dizziness, syncope, speech difficulty, numbness and headaches.  Hematological: Negative for adenopathy. Does not bruise/bleed easily.  Psychiatric/Behavioral: Negative for behavioral problems and dysphoric mood. The patient is not nervous/anxious.        Objective:   Physical Exam  Constitutional: He is oriented to person, place, and time. He appears well-developed.  Appears well.  No distress Blood pressure 120/70 Pulse 60  HENT:  Head: Normocephalic.  Right Ear: External ear normal.  Left Ear: External ear normal.  Eyes: Conjunctivae and EOM are normal.  Neck: Normal range of motion.  Cardiovascular: Normal rate and normal heart sounds.   Pulmonary/Chest: Breath sounds normal.  Abdominal: Bowel sounds are normal.  Musculoskeletal: Normal range of motion. He exhibits edema. He exhibits no tenderness.  Trace pedal edema  Neurological: He is alert and oriented to person, place, and time.  Psychiatric: He has a normal mood and affect. His behavior is normal.          Assessment & Plan:   Resolving antibiotic associated diarrhea.  Will treat with probiotic for 7 days.  Slowly advance diet.  Continue symptomatic treatment Diabetes.  Will check hemoglobin A1c Hypertension.  Blood pressure low normal.  Will decrease amlodipine to 5 mg daily Dyslipidemia.  Continue statin therapy  Recheck 3 months  [name], MD

## 2016-05-03 ENCOUNTER — Ambulatory Visit: Payer: Self-pay | Admitting: Internal Medicine

## 2016-05-18 ENCOUNTER — Ambulatory Visit: Payer: Medicare Other | Admitting: Internal Medicine

## 2016-06-08 ENCOUNTER — Other Ambulatory Visit: Payer: Self-pay

## 2016-06-11 ENCOUNTER — Encounter: Payer: Self-pay | Admitting: Internal Medicine

## 2016-06-15 ENCOUNTER — Ambulatory Visit: Payer: Medicare Other | Admitting: *Deleted

## 2016-06-15 ENCOUNTER — Encounter: Payer: Self-pay | Admitting: *Deleted

## 2016-06-15 VITALS — BP 130/80 | HR 55

## 2016-06-15 DIAGNOSIS — I10 Essential (primary) hypertension: Secondary | ICD-10-CM

## 2016-06-15 NOTE — Progress Notes (Signed)
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 Bp re-checked 130/80 with large cuff. Dr.K notified and pt given note signed by Dr.K to take to work.

## 2016-06-17 NOTE — Telephone Encounter (Signed)
Spoke to pt, told him Dr.K would not sign for back brace. He does not feel it is helpful. Pt verbalized understanding and said that is fine. The company called him and said to send. Pt said he appreciates his opinion. Told pt okay have a good day.

## 2016-06-21 ENCOUNTER — Encounter: Payer: Medicare Other | Admitting: Internal Medicine

## 2016-06-25 ENCOUNTER — Ambulatory Visit (INDEPENDENT_AMBULATORY_CARE_PROVIDER_SITE_OTHER): Payer: Medicare Other | Admitting: Internal Medicine

## 2016-06-25 ENCOUNTER — Encounter: Payer: Self-pay | Admitting: Internal Medicine

## 2016-06-25 VITALS — BP 122/80 | HR 47 | Temp 98.2°F | Resp 20 | Ht 72.0 in | Wt 243.4 lb

## 2016-06-25 DIAGNOSIS — E785 Hyperlipidemia, unspecified: Secondary | ICD-10-CM | POA: Diagnosis not present

## 2016-06-25 DIAGNOSIS — R1013 Epigastric pain: Secondary | ICD-10-CM

## 2016-06-25 DIAGNOSIS — I10 Essential (primary) hypertension: Secondary | ICD-10-CM | POA: Diagnosis not present

## 2016-06-25 DIAGNOSIS — I2510 Atherosclerotic heart disease of native coronary artery without angina pectoris: Secondary | ICD-10-CM

## 2016-06-25 DIAGNOSIS — E1159 Type 2 diabetes mellitus with other circulatory complications: Secondary | ICD-10-CM

## 2016-06-25 DIAGNOSIS — I152 Hypertension secondary to endocrine disorders: Secondary | ICD-10-CM

## 2016-06-25 NOTE — Patient Instructions (Signed)
Call or return to clinic prn if these symptoms worsen or fail to improve as anticipated.   Please check your hemoglobin A1c every 3 months  Limit your sodium (Salt) intake    It is important that you exercise regularly, at least 20 minutes 3 to 4 times per week.  If you develop chest pain or shortness of breath seek  medical attention.   

## 2016-06-25 NOTE — Progress Notes (Signed)
Subjective:    Patient ID: Brent Taylor, male    DOB: [birth year], 75 y.o.   MRN: [id number]  HPI 75 year old patient has a history of hypertension and diabetes.  He presents with a history of epigastric pain.  This occurred for 3 consecutive days, but for the past 3 days.  This has resolved.  He does describe some mild gaseousness and increase belching and some mild loose stools.  No nausea or vomiting.  He has been on chronic Nexium for reflux disease.  No other concerns or complaints.  No cardiopulmonary complaints.  He has diabetes which has been stable.  Past Medical History:  Diagnosis Date  . Cancer (HCC)    hx - melanoma  . Chicken pox   . Colon polyps   . Depression    No need for therapy.  Improved  . Diverticula, colon   . Hemorrhoids   . Hyperlipidemia   . Hypertension   . Sepsis ([place])   . UTI (lower urinary tract infection)      Social History   Social History  . Marital status: Married    Spouse name: N/A  . Number of children: 2  . Years of education: N/A   Occupational History  . Minister    Social History Main Topics  . Smoking status: Never Smoker  . Smokeless tobacco: Never Used  . Alcohol use No  . Drug use: No  . Sexual activity: Not on file   Other Topics Concern  . Not on file   Social History Narrative   Lives with wife.      Past Surgical History:  Procedure Laterality Date  . MELANOMA EXCISION     LEFT CHEST  . MELANOMA EXCISION      Family History  Problem Relation Age of Onset  . Heart Problems Mother     Skipping    Allergies  Allergen Reactions  . Lipitor [Atorvastatin Calcium]     Muscle ache    Current Outpatient Prescriptions on File Prior to Visit  Medication Sig Dispense Refill  . amLODipine (NORVASC) 5 MG tablet Take 1 tablet (5 mg total) by mouth daily. 90 tablet 3  . clopidogrel (PLAVIX) 75 MG tablet TAKE ONE TABLET (75MG  TOTAL) BY MOUTH DAILY 90 tablet 1  . esomeprazole (NEXIUM) 20 MG packet Take 20 mg by  mouth daily before breakfast. OTC    . ibuprofen (ADVIL,MOTRIN) 200 MG tablet Take 600 mg by mouth every 6 (six) hours as needed for moderate pain.     . metFORMIN (GLUCOPHAGE) 500 MG tablet TAKE ONE TABLET (50OMG TOTAL) BY MOUTH DAILY WITH BREAKFAST 30 tablet 5  . metoprolol tartrate (LOPRESSOR) 25 MG tablet TAKE 1 TABLET (25 MG TOTAL) BY MOUTH 2 (TWO) TIMES DAILY. 180 tablet 1  . pravastatin (PRAVACHOL) 40 MG tablet TAKE 1 TABLET (40 MG TOTAL) BY MOUTH DAILY. 90 tablet 1  . quinapril (ACCUPRIL) 40 MG tablet TAKE ONE TABLET (40MG  TOTAL)  BY MOUTH DAILY 90 tablet 1   No current facility-administered medications on file prior to visit.     BP 122/80 (BP Location: Left Arm, Patient Position: Sitting, Cuff Size: Normal)   Pulse (!) 47   Temp 98.2 F (36.8 C) (Oral)   Resp 20   Ht 6' (1.829 m)   Wt 243 lb 6.1 oz (110.4 kg)   SpO2 98%   BMI 33.01 kg/m      Review of Systems  Constitutional: Negative for appetite change, chills, fatigue and  fever.  HENT: Negative for congestion, dental problem, ear pain, hearing loss, sore throat, tinnitus, trouble swallowing and voice change.   Eyes: Negative for pain, discharge and visual disturbance.  Respiratory: Negative for cough, chest tightness, wheezing and stridor.   Cardiovascular: Negative for chest pain, palpitations and leg swelling.  Gastrointestinal: Positive for abdominal pain. Negative for abdominal distention, blood in stool, constipation, diarrhea, nausea and vomiting.  Genitourinary: Negative for difficulty urinating, discharge, flank pain, genital sores, hematuria and urgency.  Musculoskeletal: Negative for arthralgias, back pain, gait problem, joint swelling, myalgias and neck stiffness.  Skin: Negative for rash.  Neurological: Negative for dizziness, syncope, speech difficulty, weakness, numbness and headaches.  Hematological: Negative for adenopathy. Does not bruise/bleed easily.  Psychiatric/Behavioral: Negative for behavioral  problems and dysphoric mood. The patient is not nervous/anxious.        Objective:   Physical Exam  Constitutional: He is oriented to person, place, and time. He appears well-developed.  HENT:  Head: Normocephalic.  Right Ear: External ear normal.  Left Ear: External ear normal.  Eyes: Conjunctivae and EOM are normal.  Neck: Normal range of motion.  Cardiovascular: Normal rate and normal heart sounds.   Pulmonary/Chest: Breath sounds normal.  Abdominal: Soft. Bowel sounds are normal. He exhibits no distension. There is no tenderness. There is no rebound and no guarding.  No epigastric pain or tenderness Sounds normal  Musculoskeletal: Normal range of motion. He exhibits no edema or tenderness.  Neurological: He is alert and oriented to person, place, and time.  Psychiatric: He has a normal mood and affect. His behavior is normal.          Assessment & Plan:   Epigastric pain, resolved.  Patient has been symptom-free for about 3 days.  He is on chronic PPI therapy.  Will observe at this time Hypertension, stable Diabetes, stable  Schedule CPX  Brent Taylor Brent Taylor

## 2016-06-25 NOTE — Progress Notes (Signed)
Pre visit review using our clinic review tool, if applicable. No additional management support is needed unless otherwise documented below in the visit note. 

## 2016-07-01 DIAGNOSIS — M5442 Lumbago with sciatica, left side: Secondary | ICD-10-CM | POA: Diagnosis not present

## 2016-07-18 ENCOUNTER — Other Ambulatory Visit: Payer: Self-pay | Admitting: Internal Medicine

## 2016-09-05 ENCOUNTER — Other Ambulatory Visit: Payer: Self-pay | Admitting: Internal Medicine

## 2016-09-14 ENCOUNTER — Ambulatory Visit (INDEPENDENT_AMBULATORY_CARE_PROVIDER_SITE_OTHER): Payer: Medicare Other

## 2016-09-14 VITALS — BP 122/80 | HR 95 | Ht 72.0 in | Wt 248.5 lb

## 2016-09-14 DIAGNOSIS — Z0000 Encounter for general adult medical examination without abnormal findings: Secondary | ICD-10-CM

## 2016-09-14 DIAGNOSIS — Z23 Encounter for immunization: Secondary | ICD-10-CM | POA: Diagnosis not present

## 2016-09-14 NOTE — Progress Notes (Addendum)
Subjective:   Brent Taylor is a 75 y.o. male who presents for Medicare Annual/Subsequent preventive examination.  The Patient was informed that the wellness visit is to identify future health risk and educate and initiate measures that can reduce risk for increased disease through the lifespan.    NO ROS; Medicare Wellness Visit (last OV 9/15/217) Psychosocial; lives with spouse C/o of sinus issues today Cough; started 2 days; temp 97.9  Took 2 mucinex yesterday; does not feel ill,  Discussed flu vaccine and agreed he would take it today  Describes health as good, fair or great? Ok   Preventive Screening -Counseling & Management  Colonoscopy 10/2009/ was going to do one in [place]; had sepsis  Repeated x 5 years due to polyps  Has not had one in [place]   Current smoking/ tobacco status/never smoked Second Hand Smoke status; No Smokers in the home  ETOH no   RISK FACTORS [facility] and [facility]; and bike 75 yo 90  Minutes  meds  Would like the rx for Nexium as his insurance will pay    Diet: BMI 33  Wife cooks;  Eat 3 light meals a day  Fall risk /hips giving him issues; OA  Start walking; bothers him if he walks far Ortho states he can wait   Mobility of Functional changes this year? No   Safety; no falls  Climb ladders;  2 levels; plan to stay in place Bedroom and laundry upstairs; man cave upstairs community,  wears sunscreen/ wears a hat  Did live in [place]; hx melanoma; and goes to dermatologist  safe place for firearms if they exist ; Motor vehicle accidents; no   Cardiac Risk Factors:  Advanced aged > 25 in men; >65 in women Hyperlipidemia- chol 147; Trig 128; HDL 34; LDL 87 HTN- medically  Diabetes- a1c 6/1 - on metformin Family History mother had Heart issues Obesity; overweight;   Eye exam- diabetic; 12/2015  Depression Screen/  PhQ 2: negative  Activities of Daily Living - See functional screen   Cognitive  testing; Ad8 score; 0 or less than 2  MMSE deferred or completed if AD8 + 2 issues  Advanced Directives   List the name of Physicians or other Practitioners you currently use:   Immunization History  Administered Date(s) Administered  . Influenza Split 07/14/2012  . Influenza, High Dose Seasonal PF 07/28/2015, 09/14/2016  . Influenza,inj,Quad PF,36+ Mos 07/16/2013, 07/25/2014  . Pneumococcal Conjugate-13 03/13/2014  . Pneumococcal Polysaccharide-23 07/16/2013  . Tetanus 03/13/2014   Required Immunizations needed today  Screening test up to date or reviewed for plan of completion Health Maintenance Due  Topic Date Due  . ZOSTAVAX  11/12/1998  . INFLUENZA VACCINE  05/11/2016    Will take in January; making a change this year Cardiac Risk Factors include: advanced age (>46men, >39 women);diabetes mellitus;dyslipidemia;family history of premature cardiovascular disease;obesity (BMI >30kg/m2)     Objective:    Vitals: BP 122/80   Pulse 95   Ht 6' (1.829 m)   Wt 248 lb 8 oz (112.7 kg)   SpO2 (!) 53%   BMI 33.70 kg/m   Body mass index is 33.7 kg/m.  Tobacco History  Smoking Status  . Never Smoker  Smokeless Tobacco  . Never Used     Counseling given: Not Answered   Past Medical History:  Diagnosis Date  . Cancer (HCC)    hx - melanoma  . Chicken pox   .  Colon polyps   . Depression    No need for therapy.  Improved  . Diverticula, colon   . Hemorrhoids   . Hyperlipidemia   . Hypertension   . Sepsis ([place])   . UTI (lower urinary tract infection)    Past Surgical History:  Procedure Laterality Date  . MELANOMA EXCISION     LEFT CHEST  . MELANOMA EXCISION     Family History  Problem Relation Age of Onset  . Heart Problems Mother     Skipping   History  Sexual Activity  . Sexual activity: Not on file    Outpatient Encounter Prescriptions as of 09/14/2016  Medication Sig  . amLODipine (NORVASC) 5 MG tablet Take 1 tablet (5 mg total) by mouth daily.   [name] esomeprazole (NEXIUM) 20 MG packet Take 20 mg by mouth daily before breakfast. OTC  . ibuprofen (ADVIL,MOTRIN) 200 MG tablet Take 600 mg by mouth every 6 (six) hours as needed for moderate pain.   . metFORMIN (GLUCOPHAGE) 500 MG tablet TAKE ONE TABLET (50OMG TOTAL) BY MOUTH DAILY WITH BREAKFAST  . metoprolol tartrate (LOPRESSOR) 25 MG tablet TAKE 1 TABLET (25 MG TOTAL) BY MOUTH 2 (TWO) TIMES DAILY.  . pravastatin (PRAVACHOL) 40 MG tablet TAKE 1 TABLET (40 MG TOTAL) BY MOUTH DAILY.  [name] quinapril (ACCUPRIL) 40 MG tablet TAKE ONE TABLET (40MG  TOTAL)  BY MOUTH DAILY  . clopidogrel (PLAVIX) 75 MG tablet TAKE ONE TABLET (75MG  TOTAL) BY MOUTH DAILY   No facility-administered encounter medications on file as of 09/14/2016.     Activities of Daily Living In your present state of health, do you have any difficulty performing the following activities: 09/14/2016 11/15/2015  Hearing? N N  Vision? N Y  Difficulty concentrating or making decisions? N N  Walking or climbing stairs? N N  Dressing or bathing? N N  Doing errands, shopping? N -  Preparing Food and eating ? N -  Using the Toilet? N -  In the past six months, have you accidently leaked urine? N -  Do you have problems with loss of bowel control? N -  Managing your Medications? N -  Managing your Finances? N -  Housekeeping or managing your Housekeeping? N -  Some recent data might be hidden    Patient Care Team: Marletta Lor, MD as PCP - General (Internal Medicine)   Assessment:       Review for health history including a functional assessment, fall risk, depression screen, memory loss, vision and hearing screens; Was educated and referred as appropriate.    Psychosocial risk reviewed as stress; unresolved grief; pain; ; no issues at present   Behavioral risk addressed such as tobacco, ETOH; diet (metabolic syndrome) and exercise is good   Risk for independent living or long term plan/ plans to age in place   Risk for  safety; Bathroom; community; firearms, sun protection; auto accidents / lives on 1st floor in home; sees dermatologist annually   All immunizations and overdue screens were reviewed for a plan or follow-up.   Labs were reviewed in regard to Lipids and A1c if appropriate To defer until labs the first of the year .   Discussed Recommended screenings and documented any personalized health advice and referrals for preventive counseling.  See AVS for patient instructions;      Exercise Activities and Dietary recommendations Current Exercise Habits: Home exercise routine;Structured exercise class, Time (Minutes): > 60, Frequency (Times/Week): 3, Weekly Exercise (Minutes/Week): 0, Intensity: Moderate  Goals    . Exercise 150 minutes per week (moderate activity)          Keep active. May try to water       Fall Risk Fall Risk  09/14/2016 04/30/2015 03/14/2015 03/13/2014 07/16/2013  Falls in the past year? No No No Yes No  Number falls in past yr: - - - 1 -  Injury with Fall? - - - No -  Risk for fall due to : - - - Impaired balance/gait -   Depression Screen PHQ 2/9 Scores 09/14/2016 04/30/2015 03/14/2015 03/13/2014  PHQ - 2 Score 0 0 0 0    Cognitive Function Negative  Screen completed      6CIT Screen 09/14/2016  What Year? 0 points  What month? 0 points  What time? 0 points  Count back from 20 0 points  Months in reverse 0 points  Repeat phrase 0 points  Total Score 0  Normal exam   Immunization History  Administered Date(s) Administered  . Influenza Split 07/14/2012  . Influenza, High Dose Seasonal PF 07/28/2015, 09/14/2016  . Influenza,inj,Quad PF,36+ Mos 07/16/2013, 07/25/2014  . Pneumococcal Conjugate-13 03/13/2014  . Pneumococcal Polysaccharide-23 07/16/2013  . Tetanus 03/13/2014   Screening Tests Health Maintenance  Topic Date Due  . ZOSTAVAX  11/12/1998  . INFLUENZA VACCINE  05/11/2016  . HEMOGLOBIN A1C  10/28/2016  . OPHTHALMOLOGY EXAM  12/17/2016  . FOOT EXAM   02/03/2017  . TETANUS/TDAP  03/13/2024  . PNA vac Low Risk Adult  Completed      Plan:      PCP Notes  Health Maintenance 1. New insurance coverage January and will take his shingles vaccine next year at the [facility]   2. Took high does flu vaccine today   3. Planned  Colonoscopy repeat in jan 2017 but had sepsis; will discuss with Dr. Raliegh Ip and will most likely reschedule in the coming year with  GI   Abnormal Screens  Hearing 1000hz  in left ear; declines hearing screen at this time  Patient concerns; Has HCPOA but did take [place] forms for HCPOA and LW and will complete and bring in LW to discuss with Dr Raliegh Ip and place a copy on the chart  Would like nexium ordered per rx as coverage will pay next year Labs due and will schedule CPE with Dr. Raliegh Ip as insurance will cover physical and AWV    Nurse Concerns; none  Next PCP apt: to schedule CPE   During the course of the visit the patient was educated and counseled about the following appropriate screening and preventive services:   Vaccines to include Pneumoccal, Influenza, Hepatitis B, Td, Zostavax, HCV  Electrocardiogram  Cardiovascular Disease/ BP good   Colorectal cancer screening/ to be completed next year  Diabetes screening/ to be completed with annual CPE  Prostate Cancer Screening deferred   Glaucoma screening /completed   Nutrition counseling /   Smoking cessation counseling n/a  Patient Instructions (the written plan) was given to the patient.    [id number], RN  09/14/2016  Results of wellness visit and preventive health reviewed.  Agree with findings.  [name]

## 2016-09-14 NOTE — Patient Instructions (Addendum)
[name] , Thank you for taking time to come for your Medicare Wellness Visit. I appreciate your ongoing commitment to your health goals. Please review the following plan we discussed and let me know if I can assist you in the future.   Changing insurance in January and will get  Shingles at that time  Will drop in next week to take flu vaccine due to cold symptoms  Will discuss colonoscopy with Dr. Raliegh Ip Possible referral / had referral with [facility]   Will take the flu vaccine today;   Will completed Living will protion of advanced directive  Will schedule CPE with Dr. Raliegh Ip for repeat of labs   These are the goals we discussed: Goals    . Exercise 150 minutes per week (moderate activity)          Keep active. May try to water        This is a list of the screening recommended for you and due dates:  Health Maintenance  Topic Date Due  . Shingles Vaccine  11/12/1998  . Flu Shot  05/11/2016  . Hemoglobin A1C  10/28/2016  . Eye exam for diabetics  12/17/2016  . Complete foot exam   02/03/2017  . Tetanus Vaccine  03/13/2024  . Pneumonia vaccines  Completed        Fall Prevention in the Home Introduction Falls can cause injuries. They can happen to people of all ages. There are many things you can do to make your home safe and to help prevent falls. What can I do on the outside of my home?  Regularly fix the edges of walkways and driveways and fix any cracks.  Remove anything that might make you trip as you walk through a door, such as a raised step or threshold.  Trim any bushes or trees on the path to your home.  Use bright outdoor lighting.  Clear any walking paths of anything that might make someone trip, such as rocks or tools.  Regularly check to see if handrails are loose or broken. Make sure that both sides of any steps have handrails.  Any raised decks and porches should have guardrails on the edges.  Have any leaves, snow, or ice cleared  regularly.  Use sand or salt on walking paths during winter.  Clean up any spills in your garage right away. This includes oil or grease spills. What can I do in the bathroom?  Use night lights.  Install grab bars by the toilet and in the tub and shower. Do not use towel bars as grab bars.  Use non-skid mats or decals in the tub or shower.  If you need to sit down in the shower, use a plastic, non-slip stool.  Keep the floor dry. Clean up any water that spills on the floor as soon as it happens.  Remove soap buildup in the tub or shower regularly.  Attach bath mats securely with double-sided non-slip rug tape.  Do not have throw rugs and other things on the floor that can make you trip. What can I do in the bedroom?  Use night lights.  Make sure that you have a light by your bed that is easy to reach.  Do not use any sheets or blankets that are too big for your bed. They should not hang down onto the floor.  Have a firm chair that has side arms. You can use this for support while you get dressed.  Do not have throw  rugs and other things on the floor that can make you trip. What can I do in the kitchen?  Clean up any spills right away.  Avoid walking on wet floors.  Keep items that you use a lot in easy-to-reach places.  If you need to reach something above you, use a strong step stool that has a grab bar.  Keep electrical cords out of the way.  Do not use floor polish or wax that makes floors slippery. If you must use wax, use non-skid floor wax.  Do not have throw rugs and other things on the floor that can make you trip. What can I do with my stairs?  Do not leave any items on the stairs.  Make sure that there are handrails on both sides of the stairs and use them. Fix handrails that are broken or loose. Make sure that handrails are as long as the stairways.  Check any carpeting to make sure that it is firmly attached to the stairs. Fix any carpet that is loose  or worn.  Avoid having throw rugs at the top or bottom of the stairs. If you do have throw rugs, attach them to the floor with carpet tape.  Make sure that you have a light switch at the top of the stairs and the bottom of the stairs. If you do not have them, ask someone to add them for you. What else can I do to help prevent falls?  Wear shoes that:  Do not have high heels.  Have rubber bottoms.  Are comfortable and fit you well.  Are closed at the toe. Do not wear sandals.  If you use a stepladder:  Make sure that it is fully opened. Do not climb a closed stepladder.  Make sure that both sides of the stepladder are locked into place.  Ask someone to hold it for you, if possible.  Clearly mark and make sure that you can see:  Any grab bars or handrails.  First and last steps.  Where the edge of each step is.  Use tools that help you move around (mobility aids) if they are needed. These include:  Canes.  Walkers.  Scooters.  Crutches.  Turn on the lights when you go into a dark area. Replace any light bulbs as soon as they burn out.  Set up your furniture so you have a clear path. Avoid moving your furniture around.  If any of your floors are uneven, fix them.  If there are any pets around you, be aware of where they are.  Review your medicines with your doctor. Some medicines can make you feel dizzy. This can increase your chance of falling. Ask your doctor what other things that you can do to help prevent falls. This information is not intended to replace advice given to you by your health care provider. Make sure you discuss any questions you have with your health care provider. Document Released: 07/24/2009 Document Revised: 03/04/2016 Document Reviewed: 11/01/2014  2017 Elsevier  Health Maintenance, Male A healthy lifestyle and preventative care can promote health and wellness.  Maintain regular health, dental, and eye exams.  Eat a healthy diet.  Foods like vegetables, fruits, whole grains, low-fat dairy products, and lean protein foods contain the nutrients you need and are low in calories. Decrease your intake of foods high in solid fats, added sugars, and salt. Get information about a proper diet from your health care provider, if necessary.  Regular physical exercise is one  of the most important things you can do for your health. Most adults should get at least 150 minutes of moderate-intensity exercise (any activity that increases your heart rate and causes you to sweat) each week. In addition, most adults need muscle-strengthening exercises on 2 or more days a week.   Maintain a healthy weight. The body mass index (BMI) is a screening tool to identify possible weight problems. It provides an estimate of body fat based on height and weight. Your health care provider can find your BMI and can help you achieve or maintain a healthy weight. For males 20 years and older:  A BMI below 18.5 is considered underweight.  A BMI of 18.5 to 24.9 is normal.  A BMI of 25 to 29.9 is considered overweight.  A BMI of 30 and above is considered obese.  Maintain normal blood lipids and cholesterol by exercising and minimizing your intake of saturated fat. Eat a balanced diet with plenty of fruits and vegetables. Blood tests for lipids and cholesterol should begin at age 80 and be repeated every 5 years. If your lipid or cholesterol levels are high, you are over age 33, or you are at high risk for heart disease, you may need your cholesterol levels checked more frequently.Ongoing high lipid and cholesterol levels should be treated with medicines if diet and exercise are not working.  If you smoke, find out from your health care provider how to quit. If you do not use tobacco, do not start.  Lung cancer screening is recommended for adults aged 74-80 years who are at high risk for developing lung cancer because of a history of smoking. A yearly low-dose  CT scan of the lungs is recommended for people who have at least a 30-pack-year history of smoking and are current smokers or have quit within the past 15 years. A pack year of smoking is smoking an average of 1 pack of cigarettes a day for 1 year (for example, a 30-pack-year history of smoking could mean smoking 1 pack a day for 30 years or 2 packs a day for 15 years). Yearly screening should continue until the smoker has stopped smoking for at least 15 years. Yearly screening should be stopped for people who develop a health problem that would prevent them from having lung cancer treatment.  If you choose to drink alcohol, do not have more than 2 drinks per day. One drink is considered to be 12 oz (360 mL) of beer, 5 oz (150 mL) of wine, or 1.5 oz (45 mL) of liquor.  Avoid the use of street drugs. Do not share needles with anyone. Ask for help if you need support or instructions about stopping the use of drugs.  High blood pressure causes heart disease and increases the risk of stroke. High blood pressure is more likely to develop in:  People who have blood pressure in the end of the normal range (100-139/85-89 mm Hg).  People who are overweight or obese.  People who are African American.  If you are 69-9 years of age, have your blood pressure checked every 3-5 years. If you are 37 years of age or older, have your blood pressure checked every year. You should have your blood pressure measured twice-once when you are at a hospital or clinic, and once when you are not at a hospital or clinic. Record the average of the two measurements. To check your blood pressure when you are not at a hospital or clinic, you can use:  An automated blood pressure machine at a pharmacy.  A home blood pressure monitor.  If you are 67-56 years old, ask your health care provider if you should take aspirin to prevent heart disease.  Diabetes screening involves taking a blood sample to check your fasting blood sugar  level. This should be done once every 3 years after age 33 if you are at a normal weight and without risk factors for diabetes. Testing should be considered at a younger age or be carried out more frequently if you are overweight and have at least 1 risk factor for diabetes.  Colorectal cancer can be detected and often prevented. Most routine colorectal cancer screening begins at the age of 55 and continues through age 6. However, your health care provider may recommend screening at an earlier age if you have risk factors for colon cancer. On a yearly basis, your health care provider may provide home test kits to check for hidden blood in the stool. A small camera at the end of a tube may be used to directly examine the colon (sigmoidoscopy or colonoscopy) to detect the earliest forms of colorectal cancer. Talk to your health care provider about this at age 47 when routine screening begins. A direct exam of the colon should be repeated every 5-10 years through age 3, unless early forms of precancerous polyps or small growths are found.  People who are at an increased risk for hepatitis B should be screened for this virus. You are considered at high risk for hepatitis B if:  You were born in a country where hepatitis B occurs often. Talk with your health care provider about which countries are considered high risk.  Your parents were born in a high-risk country and you have not received a shot to protect against hepatitis B (hepatitis B vaccine).  You have HIV or AIDS.  You use needles to inject street drugs.  You live with, or have sex with, someone who has hepatitis B.  You are a man who has sex with other men (MSM).  You get hemodialysis treatment.  You take certain medicines for conditions like cancer, organ transplantation, and autoimmune conditions.  Hepatitis C blood testing is recommended for all people born from [birth year] through 1967 and any individual with known risk factors for  hepatitis C.  Healthy men should no longer receive prostate-specific antigen (PSA) blood tests as part of routine cancer screening. Talk to your health care provider about prostate cancer screening.  Testicular cancer screening is not recommended for adolescents or adult males who have no symptoms. Screening includes self-exam, a health care provider exam, and other screening tests. Consult with your health care provider about any symptoms you have or any concerns you have about testicular cancer.  Practice safe sex. Use condoms and avoid high-risk sexual practices to reduce the spread of sexually transmitted infections (STIs).  You should be screened for STIs, including gonorrhea and chlamydia if:  You are sexually active and are younger than 24 years.  You are older than 24 years, and your health care provider tells you that you are at risk for this type of infection.  Your sexual activity has changed since you were last screened, and you are at an increased risk for chlamydia or gonorrhea. Ask your health care provider if you are at risk.  If you are at risk of being infected with HIV, it is recommended that you take a prescription medicine daily to prevent HIV infection. This is called pre-exposure  prophylaxis (PrEP). You are considered at risk if:  You are a man who has sex with other men (MSM).  You are a heterosexual man who is sexually active with multiple partners.  You take drugs by injection.  You are sexually active with a partner who has HIV.  Talk with your health care provider about whether you are at high risk of being infected with HIV. If you choose to begin PrEP, you should first be tested for HIV. You should then be tested every 3 months for as long as you are taking PrEP.  Use sunscreen. Apply sunscreen liberally and repeatedly throughout the day. You should seek shade when your shadow is shorter than you. Protect yourself by wearing long sleeves, pants, a  wide-brimmed hat, and sunglasses year round whenever you are outdoors.  Tell your health care provider of new moles or changes in moles, especially if there is a change in shape or color. Also, tell your health care provider if a mole is larger than the size of a pencil eraser.  A one-time screening for abdominal aortic aneurysm (AAA) and surgical repair of large AAAs by ultrasound is recommended for men aged 48-75 years who are current or former smokers.  Stay current with your vaccines (immunizations). This information is not intended to replace advice given to you by your health care provider. Make sure you discuss any questions you have with your health care provider. Document Released: 03/25/2008 Document Revised: 10/18/2014 Document Reviewed: 07/01/2015 Elsevier Interactive Patient Education  2017 [employer].

## 2016-09-20 ENCOUNTER — Encounter: Payer: Medicare Other | Admitting: Internal Medicine

## 2016-10-14 DIAGNOSIS — Z85828 Personal history of other malignant neoplasm of skin: Secondary | ICD-10-CM | POA: Diagnosis not present

## 2016-10-14 DIAGNOSIS — D224 Melanocytic nevi of scalp and neck: Secondary | ICD-10-CM | POA: Diagnosis not present

## 2016-10-14 DIAGNOSIS — D225 Melanocytic nevi of trunk: Secondary | ICD-10-CM | POA: Diagnosis not present

## 2016-10-14 DIAGNOSIS — D1801 Hemangioma of skin and subcutaneous tissue: Secondary | ICD-10-CM | POA: Diagnosis not present

## 2016-10-14 DIAGNOSIS — L853 Xerosis cutis: Secondary | ICD-10-CM | POA: Diagnosis not present

## 2016-10-14 DIAGNOSIS — L814 Other melanin hyperpigmentation: Secondary | ICD-10-CM | POA: Diagnosis not present

## 2016-10-14 DIAGNOSIS — Z85820 Personal history of malignant melanoma of skin: Secondary | ICD-10-CM | POA: Diagnosis not present

## 2016-10-14 DIAGNOSIS — L821 Other seborrheic keratosis: Secondary | ICD-10-CM | POA: Diagnosis not present

## 2016-10-14 DIAGNOSIS — L570 Actinic keratosis: Secondary | ICD-10-CM | POA: Diagnosis not present

## 2016-10-18 ENCOUNTER — Other Ambulatory Visit: Payer: Self-pay | Admitting: Internal Medicine

## 2016-10-19 ENCOUNTER — Encounter: Payer: Medicare Other | Admitting: Internal Medicine

## 2016-10-20 ENCOUNTER — Encounter: Payer: Self-pay | Admitting: Internal Medicine

## 2016-10-20 ENCOUNTER — Ambulatory Visit (INDEPENDENT_AMBULATORY_CARE_PROVIDER_SITE_OTHER): Payer: Medicare HMO | Admitting: Internal Medicine

## 2016-10-20 VITALS — BP 158/72 | HR 61 | Temp 97.8°F | Ht 72.0 in | Wt 247.4 lb

## 2016-10-20 DIAGNOSIS — E1159 Type 2 diabetes mellitus with other circulatory complications: Secondary | ICD-10-CM | POA: Diagnosis not present

## 2016-10-20 DIAGNOSIS — Z0000 Encounter for general adult medical examination without abnormal findings: Secondary | ICD-10-CM | POA: Diagnosis not present

## 2016-10-20 DIAGNOSIS — E785 Hyperlipidemia, unspecified: Secondary | ICD-10-CM | POA: Diagnosis not present

## 2016-10-20 DIAGNOSIS — I10 Essential (primary) hypertension: Secondary | ICD-10-CM | POA: Diagnosis not present

## 2016-10-20 LAB — LIPID PANEL
CHOL/HDL RATIO: 4
Cholesterol: 130 mg/dL (ref 0–200)
HDL: 30.6 mg/dL — ABNORMAL LOW (ref >39.00)
LDL CALC: 62 mg/dL (ref 0–99)
NONHDL: 99.47
TRIGLYCERIDES: 186 mg/dL — AB (ref 0.0–149.0)
VLDL: 37.2 mg/dL (ref 0.0–40.0)

## 2016-10-20 LAB — HEMOGLOBIN A1C: Hgb A1c MFr Bld: 6.4 % (ref 4.6–6.5)

## 2016-10-20 LAB — MICROALBUMIN / CREATININE URINE RATIO
Creatinine,U: 152.2 mg/dL
MICROALB UR: 2.6 mg/dL — AB (ref 0.0–1.9)
Microalb Creat Ratio: 1.7 mg/g (ref 0.0–30.0)

## 2016-10-20 LAB — TSH: TSH: 1.43 u[IU]/mL (ref 0.35–4.50)

## 2016-10-20 MED ORDER — ESOMEPRAZOLE MAGNESIUM 20 MG PO PACK: 20.0000 mg | PACK | Freq: Every day | ORAL | 3 refills | Status: DC

## 2016-10-20 NOTE — Progress Notes (Signed)
Subjective:    Patient ID: Brent Taylor, male    DOB: Dec 09, 1940, 76 y.o.   MRN: [id number]  HPI  76 year old patient who is seen today for a preventive health examination. Patient was seen last month for a subsequent to the Medicare wellness visit.  He is doing quite well today. He has history of type 2 diabetes managed well withmetformin therapy.  He has essential hypertension anoronary artery d has been stable.  No concerns or complaints today.  He does have a history of dyslipidemia.  He remains on PPI therapy. for  Gastroesophageal reflux disease  Past Medical History:  Diagnosis Date  . Cancer (HCC)    hx - melanoma  . Chicken pox   . Colon polyps   . Depression    No need for therapy.  Improved  . Diverticula, colon   . Hemorrhoids   . Hyperlipidemia   . Hypertension   . Sepsis ([place])   . UTI (lower urinary tract infection)      Social History   Social History  . Marital status: Married    Spouse name: N/A  . Number of children: 2  . Years of education: N/A   Occupational History  . Minister    Social History Main Topics  . Smoking status: Never Smoker  . Smokeless tobacco: Never Used  . Alcohol use No  . Drug use: No  . Sexual activity: Not on file   Other Topics Concern  . Not on file   Social History Narrative   Lives with wife.      Past Surgical History:  Procedure Laterality Date  . MELANOMA EXCISION     LEFT CHEST  . MELANOMA EXCISION      Family History  Problem Relation Age of Onset  . Heart Problems Mother     Skipping    Allergies  Allergen Reactions  . Lipitor [Atorvastatin Calcium]     Muscle ache    Current Outpatient Prescriptions on File Prior to Visit  Medication Sig Dispense Refill  . amLODipine (NORVASC) 5 MG tablet Take 1 tablet (5 mg total) by mouth daily. 90 tablet 3  . clopidogrel (PLAVIX) 75 MG tablet TAKE ONE TABLET (75MG  TOTAL) BY MOUTH DAILY 90 tablet 1  . ibuprofen (ADVIL,MOTRIN) 200 MG tablet Take 600 mg by  mouth every 6 (six) hours as needed for moderate pain.     . metFORMIN (GLUCOPHAGE) 500 MG tablet TAKE ONE TABLET (50OMG TOTAL) BY MOUTH DAILY WITH BREAKFAST 30 tablet 4  . metoprolol tartrate (LOPRESSOR) 25 MG tablet TAKE 1 TABLET (25 MG TOTAL) BY MOUTH 2 (TWO) TIMES DAILY. 180 tablet 1  . pravastatin (PRAVACHOL) 40 MG tablet TAKE 1 TABLET (40 MG TOTAL) BY MOUTH DAILY. 90 tablet 1  . quinapril (ACCUPRIL) 40 MG tablet TAKE ONE TABLET (40MG  TOTAL)  BY MOUTH DAILY 90 tablet 1   No current facility-administered medications on file prior to visit.     BP (!) 158/72 (BP Location: Right Arm, Patient Position: Sitting, Cuff Size: Normal)   Pulse 61   Temp 97.8 F (36.6 C) (Oral)   Ht 6' (1.829 m)   Wt 247 lb 6.4 oz (112.2 kg)   SpO2 96%   BMI 33.55 kg/m    Review of Systems  Constitutional: Negative for appetite change, chills, fatigue and fever.  HENT: Negative for congestion, dental problem, ear pain, hearing loss, sore throat, tinnitus, trouble swallowing and voice change.   Eyes: Negative for pain, discharge and  visual disturbance.  Respiratory: Negative for cough, chest tightness, wheezing and stridor.   Cardiovascular: Negative for chest pain, palpitations and leg swelling.  Gastrointestinal: Negative for abdominal distention, abdominal pain, blood in stool, constipation, diarrhea, nausea and vomiting.  Genitourinary: Negative for difficulty urinating, discharge, flank pain, genital sores, hematuria and urgency.  Musculoskeletal: Negative for arthralgias, back pain, gait problem, joint swelling, myalgias and neck stiffness.  Skin: Negative for rash.  Neurological: Negative for dizziness, syncope, speech difficulty, weakness, numbness and headaches.  Hematological: Negative for adenopathy. Does not bruise/bleed easily.  Psychiatric/Behavioral: Negative for behavioral problems and dysphoric mood. The patient is not nervous/anxious.        Objective:   Physical Exam  Constitutional:  He appears well-developed and well-nourished.  Blood pressure well controlled Weight 247  HENT:  Head: Normocephalic and atraumatic.  Right Ear: External ear normal.  Left Ear: External ear normal.  Nose: Nose normal.  Mouth/Throat: Oropharynx is clear and moist.  Eyes: Conjunctivae and EOM are normal. Pupils are equal, round, and reactive to light. No scleral icterus.  Neck: Normal range of motion. Neck supple. No JVD present. No thyromegaly present.  Cardiovascular: Regular rhythm, normal heart sounds and intact distal pulses.  Exam reveals no gallop and no friction rub.   No murmur heard. Dorsalis pedis pulses faint.  Posterior tibial pulses full  Pulmonary/Chest: Effort normal and breath sounds normal. He exhibits no tenderness.  Status post left mastectomy  Abdominal: Soft. Bowel sounds are normal. He exhibits no distension and no mass. There is no tenderness.  Genitourinary: Prostate normal and penis normal. Rectal exam shows guaiac negative stool.  Musculoskeletal: Normal range of motion. He exhibits no edema or tenderness.  High arches  Lymphadenopathy:    He has no cervical adenopathy.  Neurological: He is alert. He has normal reflexes. No cranial nerve deficit. Coordination normal.  Skin: Skin is warm and dry. No rash noted.  Psychiatric: He has a normal mood and affect. His behavior is normal.          Assessment & Plan:   Preventive health Examination Diabetes mellitus.  Will check hemoglobin A1c and urine for microalbumin Essential hypertension, well-controlled Dyslipidemia.  Continue statin therapy Obesity.  Weight loss encouraged  Annual eye examination recommended Follow-up 6 months  Continue home blood pressure monitoring.  All medications updated  [name]

## 2016-10-20 NOTE — Progress Notes (Signed)
Pre visit review using our clinic review tool, if applicable. No additional management support is needed unless otherwise documented below in the visit note. 

## 2016-10-20 NOTE — Patient Instructions (Addendum)
Limit your sodium (Salt) intake  You need to lose weight.  Consider a lower calorie diet and regular exercise.   Please check your hemoglobin A1c every 3-6  Months    It is important that you exercise regularly, at least 20 minutes 3 to 4 times per week.  If you develop chest pain or shortness of breath seek  medical attention.  Please see your eye doctor yearly to check for diabetic eye damage

## 2016-10-22 ENCOUNTER — Other Ambulatory Visit: Payer: Self-pay | Admitting: Internal Medicine

## 2016-10-22 MED ORDER — PANTOPRAZOLE SODIUM 20 MG PO TBEC: 20.0000 mg | DELAYED_RELEASE_TABLET | Freq: Every day | ORAL | 3 refills | Status: DC

## 2016-10-26 ENCOUNTER — Encounter: Payer: Self-pay | Admitting: Internal Medicine

## 2016-10-28 ENCOUNTER — Other Ambulatory Visit: Payer: Self-pay | Admitting: Internal Medicine

## 2017-01-16 ENCOUNTER — Other Ambulatory Visit: Payer: Self-pay | Admitting: Internal Medicine

## 2017-01-27 ENCOUNTER — Other Ambulatory Visit: Payer: Self-pay | Admitting: Internal Medicine

## 2017-02-08 DIAGNOSIS — K219 Gastro-esophageal reflux disease without esophagitis: Secondary | ICD-10-CM | POA: Diagnosis not present

## 2017-02-08 DIAGNOSIS — E119 Type 2 diabetes mellitus without complications: Secondary | ICD-10-CM | POA: Diagnosis not present

## 2017-02-08 DIAGNOSIS — Z79899 Other long term (current) drug therapy: Secondary | ICD-10-CM | POA: Diagnosis not present

## 2017-02-08 DIAGNOSIS — H9319 Tinnitus, unspecified ear: Secondary | ICD-10-CM | POA: Diagnosis not present

## 2017-02-08 DIAGNOSIS — Z7984 Long term (current) use of oral hypoglycemic drugs: Secondary | ICD-10-CM | POA: Diagnosis not present

## 2017-02-08 DIAGNOSIS — Z7902 Long term (current) use of antithrombotics/antiplatelets: Secondary | ICD-10-CM | POA: Diagnosis not present

## 2017-02-08 DIAGNOSIS — E7800 Pure hypercholesterolemia, unspecified: Secondary | ICD-10-CM | POA: Diagnosis not present

## 2017-02-08 DIAGNOSIS — M2550 Pain in unspecified joint: Secondary | ICD-10-CM | POA: Diagnosis not present

## 2017-02-08 DIAGNOSIS — Z0000 Encounter for general adult medical examination without abnormal findings: Secondary | ICD-10-CM | POA: Diagnosis not present

## 2017-02-08 DIAGNOSIS — I119 Hypertensive heart disease without heart failure: Secondary | ICD-10-CM | POA: Diagnosis not present

## 2017-02-16 ENCOUNTER — Encounter: Payer: Self-pay | Admitting: Internal Medicine

## 2017-02-17 ENCOUNTER — Encounter: Payer: Self-pay | Admitting: Internal Medicine

## 2017-03-14 ENCOUNTER — Other Ambulatory Visit: Payer: Self-pay | Admitting: Internal Medicine

## 2017-04-13 ENCOUNTER — Other Ambulatory Visit: Payer: Self-pay | Admitting: Internal Medicine

## 2017-04-19 ENCOUNTER — Ambulatory Visit: Payer: Medicare HMO | Admitting: Internal Medicine

## 2017-04-20 ENCOUNTER — Encounter: Payer: Self-pay | Admitting: Internal Medicine

## 2017-04-20 ENCOUNTER — Ambulatory Visit (INDEPENDENT_AMBULATORY_CARE_PROVIDER_SITE_OTHER): Payer: Medicare HMO | Admitting: Internal Medicine

## 2017-04-20 VITALS — BP 152/82 | HR 50 | Temp 97.9°F | Ht 71.0 in | Wt 248.0 lb

## 2017-04-20 DIAGNOSIS — I2510 Atherosclerotic heart disease of native coronary artery without angina pectoris: Secondary | ICD-10-CM | POA: Diagnosis not present

## 2017-04-20 DIAGNOSIS — E1159 Type 2 diabetes mellitus with other circulatory complications: Secondary | ICD-10-CM

## 2017-04-20 DIAGNOSIS — I152 Hypertension secondary to endocrine disorders: Secondary | ICD-10-CM

## 2017-04-20 DIAGNOSIS — I5041 Acute combined systolic (congestive) and diastolic (congestive) heart failure: Secondary | ICD-10-CM | POA: Diagnosis not present

## 2017-04-20 DIAGNOSIS — I10 Essential (primary) hypertension: Secondary | ICD-10-CM

## 2017-04-20 LAB — POCT GLYCOSYLATED HEMOGLOBIN (HGB A1C): Hemoglobin A1C: 6

## 2017-04-20 MED ORDER — PRAVASTATIN SODIUM 20 MG PO TABS: 20.0000 mg | ORAL_TABLET | Freq: Every day | ORAL | 3 refills | Status: DC

## 2017-04-20 MED ORDER — AMLODIPINE BESYLATE 10 MG PO TABS: 10.0000 mg | ORAL_TABLET | Freq: Every day | ORAL | 3 refills | Status: DC

## 2017-04-20 NOTE — Progress Notes (Signed)
Subjective:    Patient ID: Brent Taylor, male    DOB: 1941-01-22, 76 y.o.   MRN: [id number]  HPI  Lab Results  Component Value Date   HGBA1C 6.4 10/20/2016    BP Readings from Last 3 Encounters:  04/20/17 (!) 152/82  10/20/16 (!) 158/72  09/14/16 122/80    Wt Readings from Last 3 Encounters:  04/20/17 248 lb (112.5 kg)  10/20/16 247 lb 6.4 oz (112.2 kg)  09/14/16 248 lb 8 oz (45.35 kg)   76 year old patient who is seen today for follow-up.  He was seen 6 months ago for his annual exam.  He is scheduled for eye examination later this month He continues to do quite well.  He does have a history of coronary artery disease and denies any ischemic symptoms.  He has a history of heart failure, which also has been stable.  No weight gain or peripheral edema.  He has essential hypertension. Systolic blood pressure readings have been a bit elevated Statin therapy has been on hold due to arthralgias and myalgias.  He states this resolved after 2 weeks.  He has not rechallenged himself on medication  Past Medical History:  Diagnosis Date  . Cancer (HCC)    hx - melanoma  . Chicken pox   . Colon polyps   . Depression    No need for therapy.  Improved  . Diverticula, colon   . Hemorrhoids   . Hyperlipidemia   . Hypertension   . Sepsis ([place])   . UTI (lower urinary tract infection)      Social History   Social History  . Marital status: Married    Spouse name: N/A  . Number of children: 2  . Years of education: N/A   Occupational History  . Minister    Social History Main Topics  . Smoking status: Never Smoker  . Smokeless tobacco: Never Used  . Alcohol use No  . Drug use: No  . Sexual activity: Not on file   Other Topics Concern  . Not on file   Social History Narrative   Lives with wife.      Past Surgical History:  Procedure Laterality Date  . MELANOMA EXCISION     LEFT CHEST  . MELANOMA EXCISION      Family History  Problem Relation Age of Onset  .  Heart Problems Mother        Skipping    Allergies  Allergen Reactions  . Lipitor [Atorvastatin Calcium]     Muscle ache    Current Outpatient Prescriptions on File Prior to Visit  Medication Sig Dispense Refill  . amLODipine (NORVASC) 5 MG tablet Take 1 tablet (5 mg total) by mouth daily. 90 tablet 3  . clopidogrel (PLAVIX) 75 MG tablet TAKE ONE TABLET (75MG  TOTAL) BY MOUTH DAILY 90 tablet 0  . ibuprofen (ADVIL,MOTRIN) 200 MG tablet Take 600 mg by mouth every 6 (six) hours as needed for moderate pain.     . metFORMIN (GLUCOPHAGE) 500 MG tablet TAKE ONE TABLET (50OMG TOTAL) BY MOUTH DAILY WITH BREAKFAST 90 tablet 3  . metoprolol tartrate (LOPRESSOR) 25 MG tablet TAKE ONE TABLET BY MOUTH TWICE A DAY 180 tablet 3  . pantoprazole (PROTONIX) 20 MG tablet Take 1 tablet (20 mg total) by mouth daily. 60 tablet 3  . quinapril (ACCUPRIL) 40 MG tablet TAKE ONE TABLET (40MG  TOTAL)  BY MOUTH DAILY 90 tablet 1   No current facility-administered medications on file prior to visit.  BP (!) 152/82 (BP Location: Left Arm, Patient Position: Sitting, Cuff Size: Normal)   Pulse (!) 50   Temp 97.9 F (36.6 C) (Oral)   Ht 5\' 11"  (1.803 m)   Wt 248 lb (112.5 kg)   SpO2 97%   BMI 34.59 kg/m     Review of Systems  Constitutional: Negative for appetite change, chills, fatigue and fever.  HENT: Negative for congestion, dental problem, ear pain, hearing loss, sore throat, tinnitus, trouble swallowing and voice change.   Eyes: Negative for pain, discharge and visual disturbance.  Respiratory: Negative for cough, chest tightness, wheezing and stridor.   Cardiovascular: Negative for chest pain, palpitations and leg swelling.  Gastrointestinal: Negative for abdominal distention, abdominal pain, blood in stool, constipation, diarrhea, nausea and vomiting.  Genitourinary: Negative for difficulty urinating, discharge, flank pain, genital sores, hematuria and urgency.  Musculoskeletal: Negative for  arthralgias, back pain, gait problem, joint swelling, myalgias and neck stiffness.  Skin: Negative for rash.  Neurological: Negative for dizziness, syncope, speech difficulty, weakness, numbness and headaches.  Hematological: Negative for adenopathy. Does not bruise/bleed easily.  Psychiatric/Behavioral: Negative for behavioral problems and dysphoric mood. The patient is not nervous/anxious.        Objective:   Physical Exam  Constitutional: He is oriented to person, place, and time. He appears well-developed.  Blood pressure 154/88  HENT:  Head: Normocephalic.  Right Ear: External ear normal.  Left Ear: External ear normal.  Eyes: Conjunctivae and EOM are normal.  Neck: Normal range of motion.  Cardiovascular: Normal rate, normal heart sounds and intact distal pulses.   Pedal pulses full  Pulmonary/Chest: Breath sounds normal.  Abdominal: Bowel sounds are normal.  Musculoskeletal: Normal range of motion. He exhibits no edema or tenderness.  Neurological: He is alert and oriented to person, place, and time.  Psychiatric: He has a normal mood and affect. His behavior is normal.          Assessment & Plan:   Diabetes mellitus.  Excellent control.  No change in therapy.  We'll continue submaximal metformin Essential hypertension.  Systolic blood pressure readings continue to be slightly elevated.  Will increase amlodipine to 10 mg daily Dyslipidemia.  Will rechallenge with pravastatin 20.  At this is not tolerated.  Will discontinue the medication Systolic heart failure, compensated Coronary artery disease, stable  CPX 6 months  Maryama Kuriakose [name]

## 2017-04-20 NOTE — Patient Instructions (Addendum)
WE NOW OFFER   [facility] FAST TRACK!!!  SAME DAY Appointments for ACUTE CARE  Such as: Sprains, Injuries, cuts, abrasions, rashes, muscle pain, joint pain, back pain Colds, flu, sore throats, headache, allergies, cough, fever  Ear pain, sinus and eye infections Abdominal pain, nausea, vomiting, diarrhea, upset stomach Animal/insect bites  3 Easy Ways to Schedule: Walk-In Scheduling Call in scheduling Mychart Sign-up: [email or web address]  Limit your sodium (Salt) intake   Please check your hemoglobin A1c every 3-6  Months    It is important that you exercise regularly, at least 20 minutes 3 to 4 times per week.  If you develop chest pain or shortness of breath seek  medical attention.  You need to lose weight.  Consider a lower calorie diet and regular exercise.

## 2017-05-06 DIAGNOSIS — E119 Type 2 diabetes mellitus without complications: Secondary | ICD-10-CM | POA: Diagnosis not present

## 2017-05-06 DIAGNOSIS — H524 Presbyopia: Secondary | ICD-10-CM | POA: Diagnosis not present

## 2017-05-06 LAB — HM DIABETES EYE EXAM

## 2017-05-09 ENCOUNTER — Encounter: Payer: Self-pay | Admitting: Internal Medicine

## 2017-06-11 ENCOUNTER — Other Ambulatory Visit: Payer: Self-pay | Admitting: Internal Medicine

## 2017-06-14 ENCOUNTER — Other Ambulatory Visit: Payer: Self-pay | Admitting: Internal Medicine

## 2017-06-15 ENCOUNTER — Other Ambulatory Visit: Payer: Self-pay | Admitting: Internal Medicine

## 2017-06-30 ENCOUNTER — Encounter: Payer: Self-pay | Admitting: Internal Medicine

## 2017-07-09 ENCOUNTER — Other Ambulatory Visit: Payer: Self-pay | Admitting: Internal Medicine

## 2017-07-27 ENCOUNTER — Encounter: Payer: Self-pay | Admitting: Internal Medicine

## 2017-07-28 ENCOUNTER — Other Ambulatory Visit: Payer: Self-pay | Admitting: Internal Medicine

## 2017-07-28 MED ORDER — FLUOXETINE HCL 20 MG PO TABS: 20.0000 mg | ORAL_TABLET | Freq: Every day | ORAL | 3 refills | Status: DC

## 2017-09-08 ENCOUNTER — Other Ambulatory Visit: Payer: Self-pay | Admitting: Internal Medicine

## 2017-10-07 ENCOUNTER — Other Ambulatory Visit: Payer: Self-pay | Admitting: Internal Medicine

## 2017-10-07 ENCOUNTER — Ambulatory Visit (INDEPENDENT_AMBULATORY_CARE_PROVIDER_SITE_OTHER): Payer: Medicare HMO

## 2017-10-07 DIAGNOSIS — Z23 Encounter for immunization: Secondary | ICD-10-CM | POA: Diagnosis not present

## 2017-10-13 ENCOUNTER — Ambulatory Visit: Payer: Medicare HMO

## 2017-10-14 DIAGNOSIS — D2262 Melanocytic nevi of left upper limb, including shoulder: Secondary | ICD-10-CM | POA: Diagnosis not present

## 2017-10-14 DIAGNOSIS — D225 Melanocytic nevi of trunk: Secondary | ICD-10-CM | POA: Diagnosis not present

## 2017-10-14 DIAGNOSIS — Z85820 Personal history of malignant melanoma of skin: Secondary | ICD-10-CM | POA: Diagnosis not present

## 2017-10-14 DIAGNOSIS — L570 Actinic keratosis: Secondary | ICD-10-CM | POA: Diagnosis not present

## 2017-10-14 DIAGNOSIS — D224 Melanocytic nevi of scalp and neck: Secondary | ICD-10-CM | POA: Diagnosis not present

## 2017-10-14 DIAGNOSIS — Z85828 Personal history of other malignant neoplasm of skin: Secondary | ICD-10-CM | POA: Diagnosis not present

## 2017-10-14 DIAGNOSIS — L821 Other seborrheic keratosis: Secondary | ICD-10-CM | POA: Diagnosis not present

## 2017-10-25 ENCOUNTER — Encounter: Payer: Self-pay | Admitting: Internal Medicine

## 2017-10-25 ENCOUNTER — Ambulatory Visit (INDEPENDENT_AMBULATORY_CARE_PROVIDER_SITE_OTHER): Payer: Medicare HMO | Admitting: Internal Medicine

## 2017-10-25 VITALS — BP 120/62 | HR 55 | Temp 98.2°F | Ht 71.0 in | Wt 246.0 lb

## 2017-10-25 DIAGNOSIS — I5041 Acute combined systolic (congestive) and diastolic (congestive) heart failure: Secondary | ICD-10-CM

## 2017-10-25 DIAGNOSIS — Z0000 Encounter for general adult medical examination without abnormal findings: Secondary | ICD-10-CM

## 2017-10-25 DIAGNOSIS — Z86010 Personal history of colonic polyps: Secondary | ICD-10-CM | POA: Diagnosis not present

## 2017-10-25 DIAGNOSIS — E785 Hyperlipidemia, unspecified: Secondary | ICD-10-CM

## 2017-10-25 DIAGNOSIS — I2510 Atherosclerotic heart disease of native coronary artery without angina pectoris: Secondary | ICD-10-CM

## 2017-10-25 DIAGNOSIS — E1159 Type 2 diabetes mellitus with other circulatory complications: Secondary | ICD-10-CM

## 2017-10-25 LAB — CBC WITH DIFFERENTIAL/PLATELET
BASOS PCT: 0.8 % (ref 0.0–3.0)
Basophils Absolute: 0 10*3/uL (ref 0.0–0.1)
Eosinophils Absolute: 0.2 10*3/uL (ref 0.0–0.7)
Eosinophils Relative: 3.3 % (ref 0.0–5.0)
HEMATOCRIT: 49.8 % (ref 39.0–52.0)
Hemoglobin: 16.1 g/dL (ref 13.0–17.0)
LYMPHS PCT: 19 % (ref 12.0–46.0)
Lymphs Abs: 1 10*3/uL (ref 0.7–4.0)
MCHC: 32.3 g/dL (ref 30.0–36.0)
MCV: 88.2 fl (ref 78.0–100.0)
MONOS PCT: 10.4 % (ref 3.0–12.0)
Monocytes Absolute: 0.6 10*3/uL (ref 0.1–1.0)
NEUTROS ABS: 3.5 10*3/uL (ref 1.4–7.7)
Neutrophils Relative %: 66.5 % (ref 43.0–77.0)
PLATELETS: 211 10*3/uL (ref 150.0–400.0)
RBC: 5.65 Mil/uL (ref 4.22–5.81)
RDW: 15.1 % (ref 11.5–15.5)
WBC: 5.3 10*3/uL (ref 4.0–10.5)

## 2017-10-25 LAB — LIPID PANEL
CHOL/HDL RATIO: 4
Cholesterol: 147 mg/dL (ref 0–200)
HDL: 34.5 mg/dL — AB (ref >39.00)
LDL Cholesterol: 76 mg/dL (ref 0–99)
NonHDL: 112.18
Triglycerides: 180 mg/dL — ABNORMAL HIGH (ref 0.0–149.0)
VLDL: 36 mg/dL (ref 0.0–40.0)

## 2017-10-25 LAB — MICROALBUMIN / CREATININE URINE RATIO
Creatinine,U: 129.7 mg/dL
Microalb Creat Ratio: 1.5 mg/g (ref 0.0–30.0)
Microalb, Ur: 1.9 mg/dL (ref 0.0–1.9)

## 2017-10-25 LAB — COMPREHENSIVE METABOLIC PANEL
ALT: 21 U/L (ref 0–53)
AST: 19 U/L (ref 0–37)
Albumin: 4.3 g/dL (ref 3.5–5.2)
Alkaline Phosphatase: 54 U/L (ref 39–117)
BILIRUBIN TOTAL: 0.7 mg/dL (ref 0.2–1.2)
BUN: 21 mg/dL (ref 6–23)
CO2: 28 mEq/L (ref 19–32)
Calcium: 9.3 mg/dL (ref 8.4–10.5)
Chloride: 103 mEq/L (ref 96–112)
Creatinine, Ser: 1.18 mg/dL (ref 0.40–1.50)
GFR: 63.3 mL/min (ref >60.00)
GLUCOSE: 110 mg/dL — AB (ref 70–99)
POTASSIUM: 4.4 meq/L (ref 3.5–5.1)
SODIUM: 140 meq/L (ref 135–145)
Total Protein: 7 g/dL (ref 6.0–8.3)

## 2017-10-25 LAB — HEMOGLOBIN A1C: Hgb A1c MFr Bld: 6.4 % (ref 4.6–6.5)

## 2017-10-25 LAB — TSH: TSH: 1.67 u[IU]/mL (ref 0.35–4.50)

## 2017-10-25 NOTE — Progress Notes (Signed)
Subjective:    Patient ID: Brent Taylor, male    DOB: [birth year], 77 y.o.   MRN: [id number]  HPI  77 year old patient who is seen today for a preventive health examination and subsequent Medicare wellness visit He has a history of coronary artery disease as well as combined systolic and diastolic heart failure.  Clinically he continues to do quite well. He has type 2 diabetes which has been tightly controlled on oral medications.  He has mild dyslipidemia and remains on statin therapy.  He has a history of colonic polyps and his last colonoscopy was 2011 He has a remote history of melanoma.  He has had a recent dermatologic evaluation earlier this month. He is followed annually by ophthalmology.  No cardiopulmonary complaints  Past Medical History:  Diagnosis Date  . Cancer (HCC)    hx - melanoma  . Chicken pox   . Colon polyps   . Depression    No need for therapy.  Improved  . Diverticula, colon   . Hemorrhoids   . Hyperlipidemia   . Hypertension   . Sepsis ([place])   . UTI (lower urinary tract infection)      Social History   Socioeconomic History  . Marital status: Married    Spouse name: Not on file  . Number of children: 2  . Years of education: Not on file  . Highest education level: Not on file  Social Needs  . Financial resource strain: Not on file  . Food insecurity - worry: Not on file  . Food insecurity - inability: Not on file  . Transportation needs - medical: Not on file  . Transportation needs - non-medical: Not on file  Occupational History  . Occupation: Company secretary  Tobacco Use  . Smoking status: Never Smoker  . Smokeless tobacco: Never Used  Substance and Sexual Activity  . Alcohol use: No  . Drug use: No  . Sexual activity: Not on file  Other Topics Concern  . Not on file  Social History Narrative   Lives with wife.      Past Surgical History:  Procedure Laterality Date  . MELANOMA EXCISION     LEFT CHEST  . MELANOMA EXCISION       Family History  Problem Relation Age of Onset  . Heart Problems Mother        Skipping    Allergies  Allergen Reactions  . Lipitor [Atorvastatin Calcium]     Muscle ache    Current Outpatient Medications on File Prior to Visit  Medication Sig Dispense Refill  . amLODipine (NORVASC) 10 MG tablet Take 1 tablet (10 mg total) by mouth daily. 90 tablet 3  . clopidogrel (PLAVIX) 75 MG tablet TAKE ONE TABLET (75MG  TOTAL) BY MOUTH DAILY 90 tablet 0  . FLUoxetine (PROZAC) 20 MG tablet Take 1 tablet (20 mg total) by mouth daily. 60 tablet 3  . ibuprofen (ADVIL,MOTRIN) 200 MG tablet Take 600 mg by mouth every 6 (six) hours as needed for moderate pain.     . metFORMIN (GLUCOPHAGE) 500 MG tablet TAKE ONE TABLET (50OMG TOTAL) BY MOUTH DAILY WITH BREAKFAST 90 tablet 2  . metoprolol tartrate (LOPRESSOR) 25 MG tablet TAKE ONE TABLET BY MOUTH TWICE A DAY 180 tablet 3  . pantoprazole (PROTONIX) 20 MG tablet TAKE ONE TABLET BY MOUTH DAILY 60 tablet 2  . pravastatin (PRAVACHOL) 20 MG tablet Take 1 tablet (20 mg total) by mouth daily. 90 tablet 3  . quinapril (ACCUPRIL) 40 MG  tablet TAKE ONE TABLET (40MG  TOTAL)  BY MOUTH DAILY 90 tablet 2   No current facility-administered medications on file prior to visit.     BP 120/62 (BP Location: Left Arm, Patient Position: Sitting, Cuff Size: Normal)   Pulse (!) 55   Temp 98.2 F (36.8 C) (Oral)   Ht 5\' 11"  (1.803 m)   Wt 246 lb (111.6 kg)   SpO2 95%   BMI 34.31 kg/m   Subsequent Medicare wellness visit  1. Risk factors, based on past  M,S,F history.  Patient has known coronary artery disease.  Cardiovascular risk factors include diabetes hypertension and dyslipidemia    2.  Physical activities: Remains active.  Does participate in activities at the gym 2-3 times per week  3.  Depression/mood: No history of major depression or mood disorder 4.  Hearing: No major deficits.  Does have chronic tinnitus  5.  ADL's:  Independent 6.  Fall risk:  Low 7.  Home safety: No problems identified  8.  Height weight, and visual acuity; height and weight stable no change in visual acuity 9.  Counseling: Modest weight loss heart healthy diet encouraged 10. Lab orders based on risk factors: We will check laboratory update including lipid profile and urine for microalbumin  11. Referral : Follow-up ophthalmology and dermatology  12. Care plan: Continue efforts at aggressive risk factor modification  13. Cognitive assessment: Alert and oriented with normal affect.  No cognitive dysfunction  14. Screening: Patient provided with a written and personalized 5-10 year screening schedule in the AVS.    15. Provider List Update: Primary care ophthalmology and dermatology    Review of Systems  Constitutional: Negative for appetite change, chills, fatigue and fever.  HENT: Positive for tinnitus. Negative for congestion, dental problem, ear pain, hearing loss, sore throat, trouble swallowing and voice change.   Eyes: Negative for pain, discharge and visual disturbance.  Respiratory: Negative for cough, chest tightness, wheezing and stridor.   Cardiovascular: Negative for chest pain, palpitations and leg swelling.  Gastrointestinal: Negative for abdominal distention, abdominal pain, blood in stool, constipation, diarrhea, nausea and vomiting.  Genitourinary: Negative for difficulty urinating, discharge, flank pain, genital sores, hematuria and urgency.  Musculoskeletal: Negative for arthralgias, back pain, gait problem, joint swelling, myalgias and neck stiffness.  Skin: Negative for rash.  Neurological: Negative for dizziness, syncope, speech difficulty, weakness, numbness and headaches.  Hematological: Negative for adenopathy. Does not bruise/bleed easily.  Psychiatric/Behavioral: Negative for behavioral problems and dysphoric mood. The patient is not nervous/anxious.        Objective:   Physical Exam  Constitutional: He appears well-developed  and well-nourished.  Weight 246 Blood pressure low normal  HENT:  Head: Normocephalic and atraumatic.  Right Ear: External ear normal.  Left Ear: External ear normal.  Nose: Nose normal.  Mouth/Throat: Oropharynx is clear and moist.  Eyes: Conjunctivae and EOM are normal. Pupils are equal, round, and reactive to light. No scleral icterus.  Neck: Normal range of motion. Neck supple. No JVD present. No thyromegaly present.  Cardiovascular: Regular rhythm, normal heart sounds and intact distal pulses. Exam reveals no gallop and no friction rub.  No murmur heard. Dorsalis pedis pulses very faint.  Posterior tibial pulses intact  Pulmonary/Chest: Effort normal. He has rales. He exhibits no tenderness.  Abdominal: Soft. Bowel sounds are normal. He exhibits no distension and no mass. There is no tenderness.  Genitourinary: Prostate normal and penis normal. Rectal exam shows guaiac negative stool.  Genitourinary Comments: Symmetrically  enlarged prostate  Musculoskeletal: Normal range of motion. He exhibits no edema or tenderness.  High arches with hammertoe deformities  Lymphadenopathy:    He has no cervical adenopathy.  Neurological: He is alert. He has normal reflexes. No cranial nerve deficit. Coordination normal.  Skin: Skin is warm and dry. No rash noted.  Status post partial mastectomy  Psychiatric: He has a normal mood and affect. His behavior is normal.          Assessment & Plan:  Preventive health examination  Subsequent Medicare wellness visit Coronary artery disease stable Essential hypertension well-controlled Dyslipidemia continue statin therapy.  Will review a lipid profile Type 2 diabetes.  Will review a hemoglobin A1c and urine for microalbumin  We will give a Tdap Follow-up 6 months  Klara Stjames [name]

## 2017-10-25 NOTE — Patient Instructions (Signed)
Limit your sodium (Salt) intake   Please check your hemoglobin A1c every 3-6  Months  Please check your blood pressure on a regular basis.  If it is consistently greater than 150/90, please make an office appointment.  Return in 6 months for follow-up  

## 2017-11-19 ENCOUNTER — Other Ambulatory Visit: Payer: Self-pay | Admitting: Internal Medicine

## 2018-01-05 ENCOUNTER — Other Ambulatory Visit: Payer: Self-pay | Admitting: Internal Medicine

## 2018-01-17 IMAGING — CT CT HEAD W/O CM
1 series · 15 of 30 positions shown, 19 images · non-contrast
Comparison: [facility] 11/10/2015

CLINICAL DATA: One week history of fevers, chills and weakness.
Meningitis.

EXAM:
CT HEAD WITHOUT CONTRAST
TECHNIQUE: Contiguous axial images were obtained from the base of the skull
through the vertex without contrast.

[Series 2: head 5.0 h30s · axial · 0.48mm/px · z∈[-153,+12]mm · 15 of 37 slices shown, 19 images]
[im 2/37  brain]
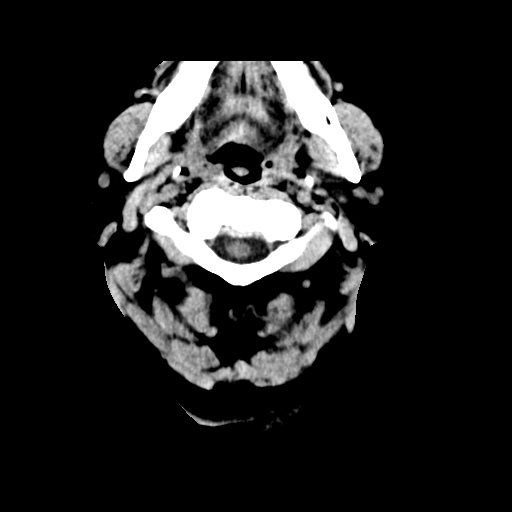
[im 2/37  bone]
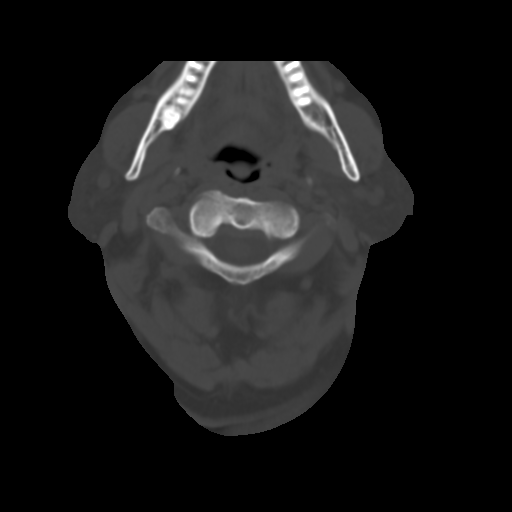
[im 4/37  brain]
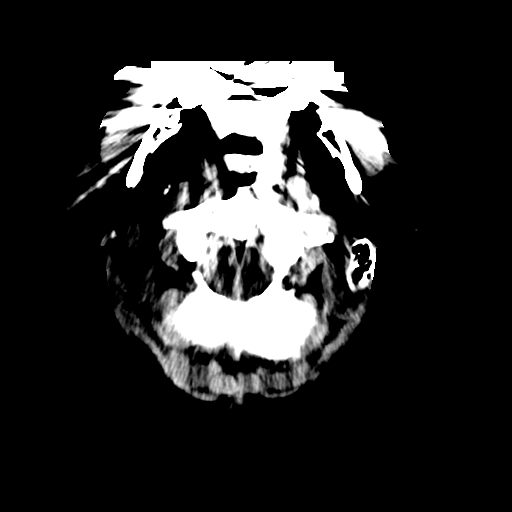
[im 7/37  brain]
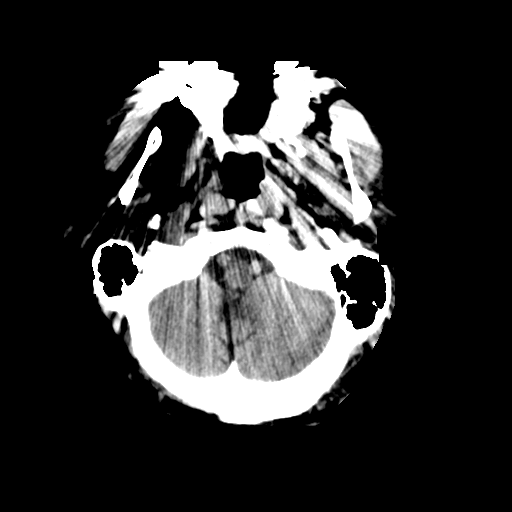
[im 9/37  brain]
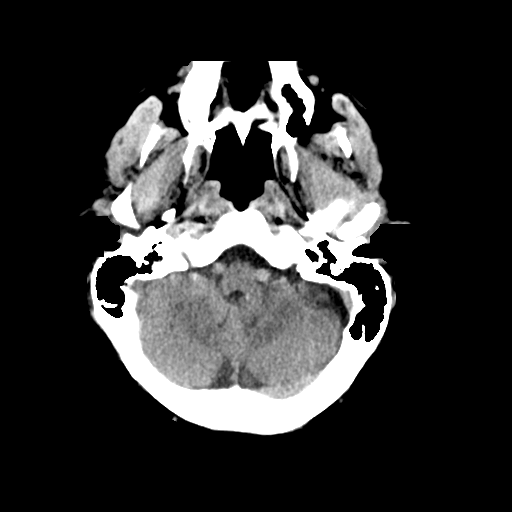
[im 12/37  brain]
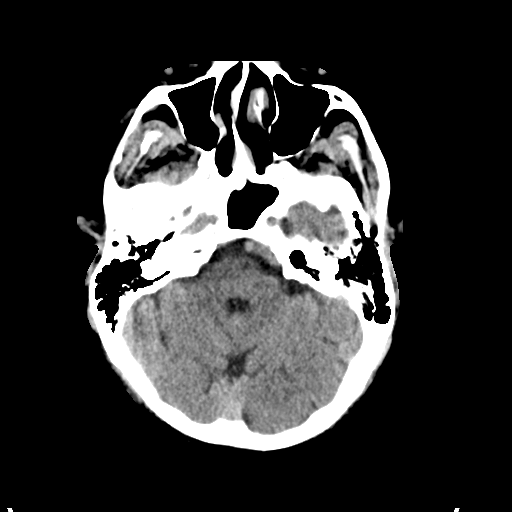
[im 12/37  bone]
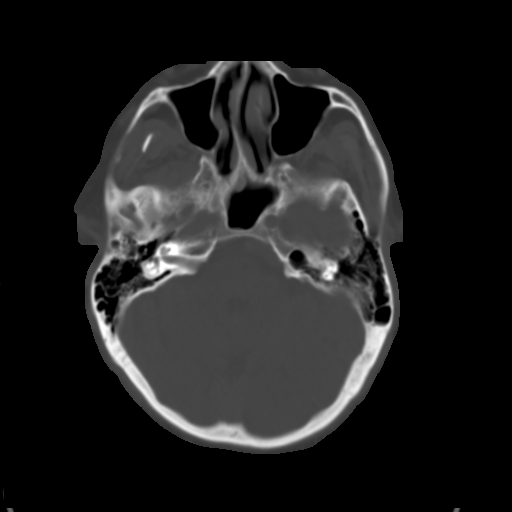
[im 14/37  brain]
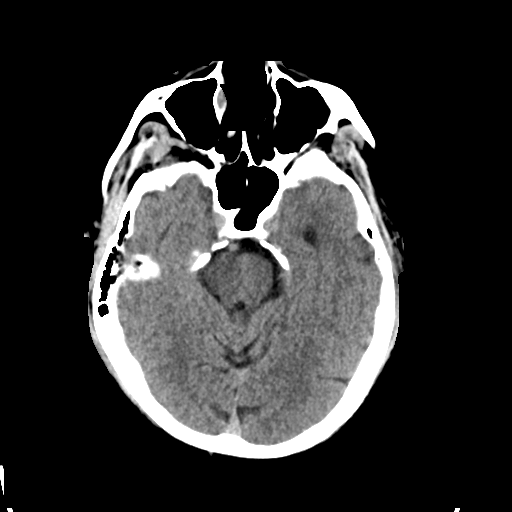
[im 17/37  brain]
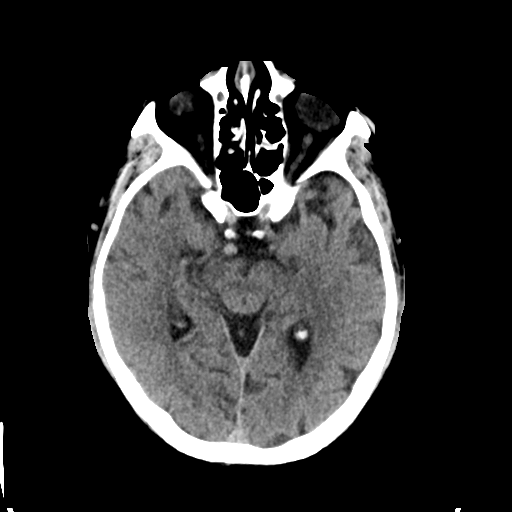
[im 19/37  brain]
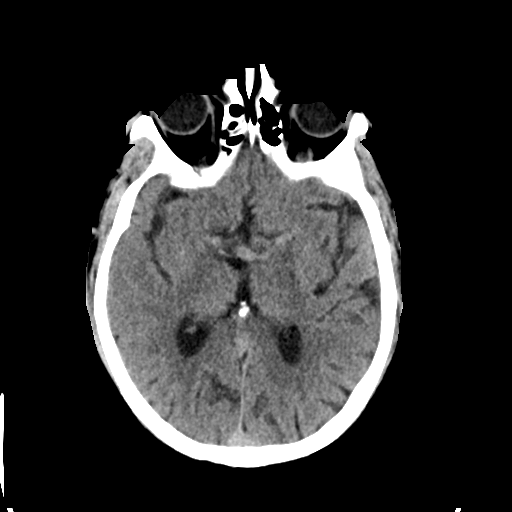
[im 20/37  brain]
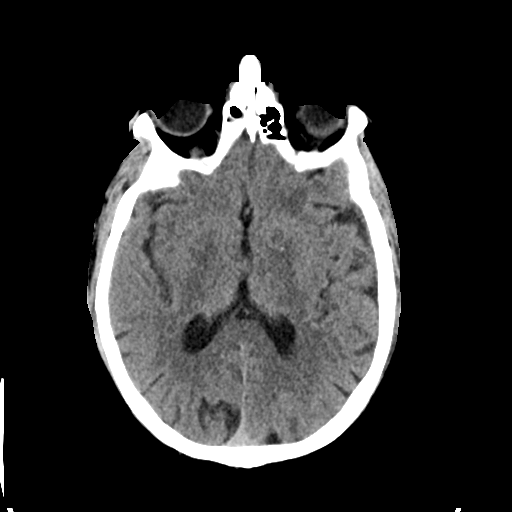
[im 20/37  bone]
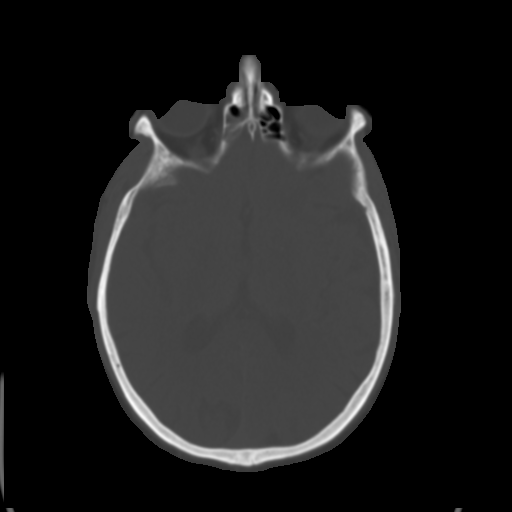
[im 23/37  brain]
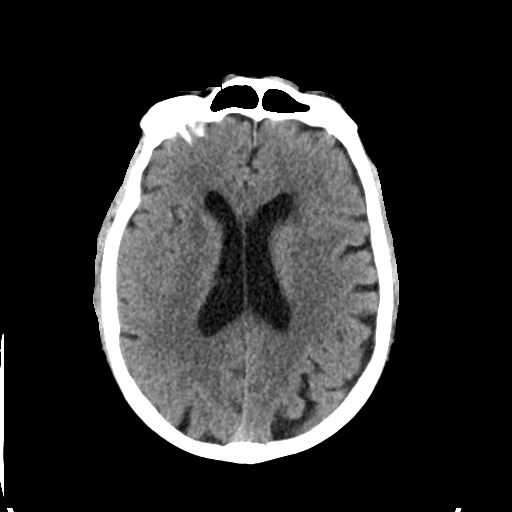
[im 25/37  brain]
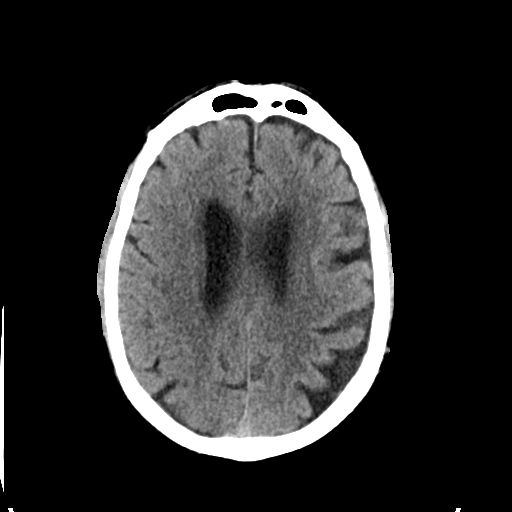
[im 28/37  brain]
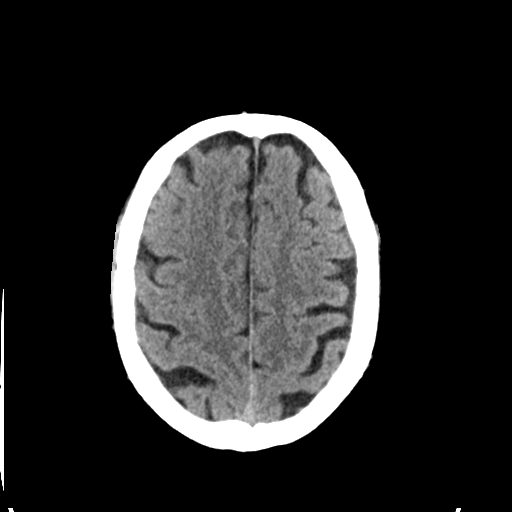
[im 30/37  brain]
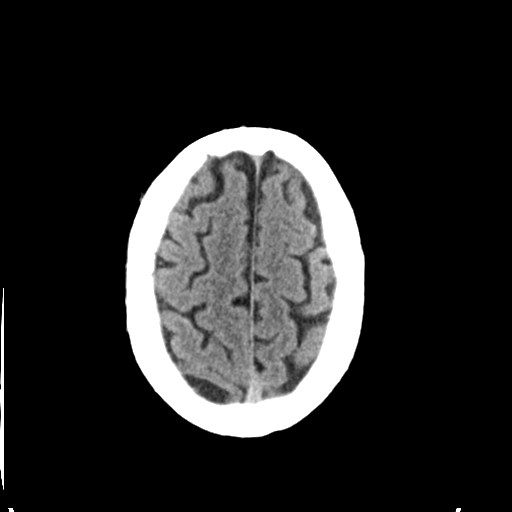
[im 30/37  bone]
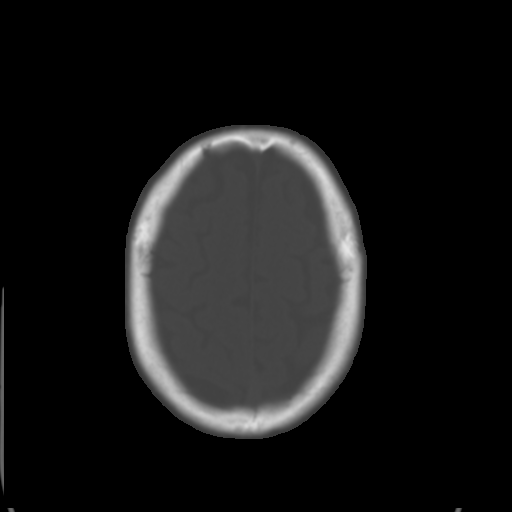
[im 33/37  brain]
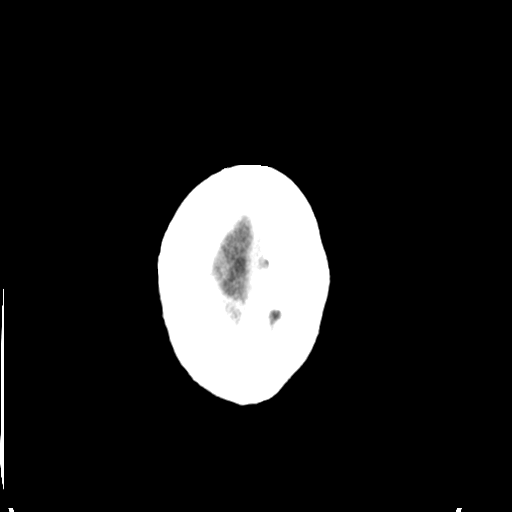
[im 35/37  brain]
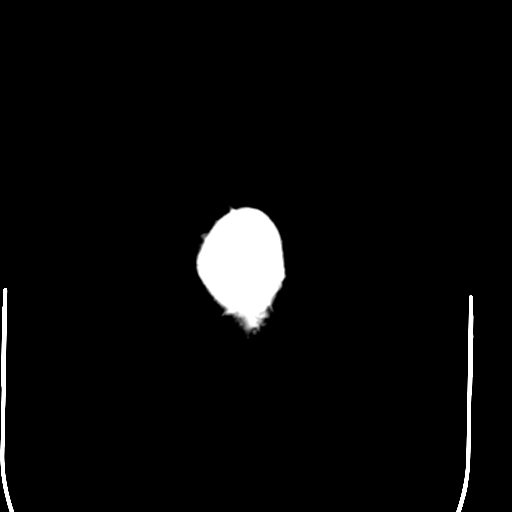

[15 of 30 positions shown; findings below may reference images not displayed]

FINDINGS: No evidence for acute hemorrhage, mass lesion, midline shift,
hydrocephalus or large infarct. There is periapical lucency
involving the left side of the mandible which appears to be between
the left lower second bicuspid and the left first molar. This could
be a source of odontogenic infection. There may also be a caries
involving the right lower third molar. Small amount of mucosal
disease in the right maxillary sinus. There is mucosal disease in
the ethmoid air cells, left side greater than right. Small amount of
mucosal disease in the right frontal sinus. Mastoid air cells are
clear. No evidence for calvarial fracture.
IMPRESSION: No acute intracranial abnormality.

Periapical lucency involving the left side of the mandible. This
could represent an odontogenic infection and recommend clinical
correlation in this area. In addition, there appears to be a caries
involving the right lower third molar.

Paranasal sinus disease.

## 2018-02-20 DIAGNOSIS — I10 Essential (primary) hypertension: Secondary | ICD-10-CM | POA: Diagnosis not present

## 2018-02-20 DIAGNOSIS — Z7902 Long term (current) use of antithrombotics/antiplatelets: Secondary | ICD-10-CM | POA: Diagnosis not present

## 2018-02-20 DIAGNOSIS — E669 Obesity, unspecified: Secondary | ICD-10-CM | POA: Diagnosis not present

## 2018-02-20 DIAGNOSIS — I2510 Atherosclerotic heart disease of native coronary artery without angina pectoris: Secondary | ICD-10-CM | POA: Diagnosis not present

## 2018-02-20 DIAGNOSIS — K219 Gastro-esophageal reflux disease without esophagitis: Secondary | ICD-10-CM | POA: Diagnosis not present

## 2018-02-20 DIAGNOSIS — G8929 Other chronic pain: Secondary | ICD-10-CM | POA: Diagnosis not present

## 2018-02-20 DIAGNOSIS — R69 Illness, unspecified: Secondary | ICD-10-CM | POA: Diagnosis not present

## 2018-02-20 DIAGNOSIS — J302 Other seasonal allergic rhinitis: Secondary | ICD-10-CM | POA: Diagnosis not present

## 2018-02-20 DIAGNOSIS — E785 Hyperlipidemia, unspecified: Secondary | ICD-10-CM | POA: Diagnosis not present

## 2018-02-20 DIAGNOSIS — R7303 Prediabetes: Secondary | ICD-10-CM | POA: Diagnosis not present

## 2018-04-05 ENCOUNTER — Other Ambulatory Visit: Payer: Self-pay | Admitting: Internal Medicine

## 2018-04-10 DIAGNOSIS — M545 Low back pain: Secondary | ICD-10-CM | POA: Diagnosis not present

## 2018-04-10 DIAGNOSIS — M48061 Spinal stenosis, lumbar region without neurogenic claudication: Secondary | ICD-10-CM | POA: Diagnosis not present

## 2018-04-10 DIAGNOSIS — M25551 Pain in right hip: Secondary | ICD-10-CM | POA: Diagnosis not present

## 2018-04-15 ENCOUNTER — Encounter: Payer: Self-pay | Admitting: Internal Medicine

## 2018-04-21 DIAGNOSIS — M545 Low back pain: Secondary | ICD-10-CM | POA: Diagnosis not present

## 2018-05-03 DIAGNOSIS — M545 Low back pain: Secondary | ICD-10-CM | POA: Diagnosis not present

## 2018-05-08 ENCOUNTER — Encounter: Payer: Self-pay | Admitting: Internal Medicine

## 2018-05-08 DIAGNOSIS — H524 Presbyopia: Secondary | ICD-10-CM | POA: Diagnosis not present

## 2018-05-08 DIAGNOSIS — E119 Type 2 diabetes mellitus without complications: Secondary | ICD-10-CM | POA: Diagnosis not present

## 2018-05-08 LAB — HM DIABETES EYE EXAM

## 2018-06-27 DIAGNOSIS — Z85820 Personal history of malignant melanoma of skin: Secondary | ICD-10-CM | POA: Diagnosis not present

## 2018-06-27 DIAGNOSIS — D692 Other nonthrombocytopenic purpura: Secondary | ICD-10-CM | POA: Diagnosis not present

## 2018-06-27 DIAGNOSIS — L603 Nail dystrophy: Secondary | ICD-10-CM | POA: Diagnosis not present

## 2018-06-27 DIAGNOSIS — Z85828 Personal history of other malignant neoplasm of skin: Secondary | ICD-10-CM | POA: Diagnosis not present

## 2018-07-05 ENCOUNTER — Other Ambulatory Visit: Payer: Self-pay | Admitting: Internal Medicine

## 2018-07-07 NOTE — Telephone Encounter (Signed)
Pt aware to follow up with new PCP for further refills due to Dr.Kwiakowski's retiring.  

## 2018-07-11 ENCOUNTER — Encounter: Payer: Self-pay | Admitting: Family Medicine

## 2018-07-11 ENCOUNTER — Ambulatory Visit (INDEPENDENT_AMBULATORY_CARE_PROVIDER_SITE_OTHER): Payer: Medicare HMO | Admitting: Family Medicine

## 2018-07-11 VITALS — BP 118/76 | HR 48 | Temp 98.1°F | Ht 71.0 in | Wt 247.8 lb

## 2018-07-11 DIAGNOSIS — C439 Malignant melanoma of skin, unspecified: Secondary | ICD-10-CM

## 2018-07-11 DIAGNOSIS — E785 Hyperlipidemia, unspecified: Secondary | ICD-10-CM | POA: Diagnosis not present

## 2018-07-11 DIAGNOSIS — Z23 Encounter for immunization: Secondary | ICD-10-CM | POA: Diagnosis not present

## 2018-07-11 DIAGNOSIS — E1169 Type 2 diabetes mellitus with other specified complication: Secondary | ICD-10-CM | POA: Diagnosis not present

## 2018-07-11 DIAGNOSIS — F325 Major depressive disorder, single episode, in full remission: Secondary | ICD-10-CM | POA: Diagnosis not present

## 2018-07-11 DIAGNOSIS — R69 Illness, unspecified: Secondary | ICD-10-CM | POA: Diagnosis not present

## 2018-07-11 DIAGNOSIS — I152 Hypertension secondary to endocrine disorders: Secondary | ICD-10-CM

## 2018-07-11 DIAGNOSIS — I10 Essential (primary) hypertension: Secondary | ICD-10-CM

## 2018-07-11 DIAGNOSIS — E1159 Type 2 diabetes mellitus with other circulatory complications: Secondary | ICD-10-CM | POA: Diagnosis not present

## 2018-07-11 LAB — POCT GLYCOSYLATED HEMOGLOBIN (HGB A1C): HEMOGLOBIN A1C: 5.9 % — AB (ref 4.0–5.6)

## 2018-07-11 NOTE — Progress Notes (Signed)
   Subjective:  Brent Taylor is a 77 y.o. male who presents today with a chief complaint of T2DM and to transfer care to this office.[name]   HPI:  T2DM Currently on metformin 500mg  daily. Tolerating well without side effects.  HTN Currently on norvasc 10mg  daily, quinapril 40mg  daily, and metoprolol 25mg  bid.  Tolerating well without apparent side effects.  No reported chest pain or shortness of breath.  GERD Currently on protonix 20mg  daily.  Tolerating well.  HLD Currently on pravastatin 20mg  daily. Tolerating well without side effects.   Melanoma Had excision in 2000.  Sees derm once a year.   ROS: Per HPI  PMH: He reports that he has never smoked. He has never used smokeless tobacco. He reports that he does not drink alcohol or use drugs.  Objective:  Physical Exam: BP 118/76 (BP Location: Left Arm, Patient Position: Sitting, Cuff Size: Normal)   Pulse (!) 48   Temp 98.1 F (36.7 C) (Oral)   Ht 5\' 11"  (1.803 m)   Wt 247 lb 12.8 oz (112.4 kg)   SpO2 96%   BMI 34.56 kg/m   Wt Readings from Last 3 Encounters:  07/11/18 247 lb 12.8 oz (112.4 kg)  10/25/17 246 lb (111.6 kg)  04/20/17 248 lb (112.5 kg)  Gen: NAD, resting comfortably CV: RRR with no murmurs appreciated Pulm: NWOB, CTAB with no crackles, wheezes, or rhonchi  Results for orders placed or performed in visit on 07/11/18 (from the past 24 hour(s))  POCT glycosylated hemoglobin (Hb A1C)     Status: Abnormal   Collection Time: 07/11/18  9:25 AM  Result Value Ref Range   Hemoglobin A1C 5.9 (A) 4.0 - 5.6 %   HbA1c POC (<> result, manual entry)     HbA1c, POC (prediabetic range)     HbA1c, POC (controlled diabetic range)      Assessment/Plan:  Type 2 diabetes mellitus with vascular disease (HCC) A1c 5.9.  Continue metformin 500 mg daily.  Melanoma Stable.  Follows with dermatology once yearly.  Major depression in remission ([place]) Stable.  Continue Prozac 20 mg daily.  Hypertension associated with diabetes  ([place]) At goal.  Continue Norvasc 10 mg daily, metoprolol 25 mg twice daily, and quinapril 40 mg daily.  Dyslipidemia associated with type 2 diabetes mellitus (HCC) Last LDL 76.  Continue pravastatin 20 mg daily.  Check lipid panel with next blood draw.  Preventative Healthcare Patient was instructed to return soon for CPE.  Flu shot given today. Health Maintenance Due  Topic Date Due  . FOOT EXAM  04/20/2018  . HEMOGLOBIN A1C  04/24/2018  . INFLUENZA VACCINE  05/11/2018   Algis Greenhouse. [name], MD 07/11/2018 10:04 AM

## 2018-07-11 NOTE — Assessment & Plan Note (Signed)
A1c 5.9.  Continue metformin 500 mg daily.

## 2018-07-11 NOTE — Patient Instructions (Signed)
It was very nice to see you today!  Keep up the good work! You are doing a great job!  No changes today.  We will give you your flu shot.  Come back to see me in a few months for your physical, or sooner as needed.  Take care, Dr [name]

## 2018-07-11 NOTE — Assessment & Plan Note (Signed)
Last LDL 76.  Continue pravastatin 20 mg daily.  Check lipid panel with next blood draw.

## 2018-07-11 NOTE — Assessment & Plan Note (Signed)
At goal.  Continue Norvasc 10 mg daily, metoprolol 25 mg twice daily, and quinapril 40 mg daily.

## 2018-07-11 NOTE — Assessment & Plan Note (Signed)
Stable.  Continue Prozac 20 mg daily. 

## 2018-07-11 NOTE — Assessment & Plan Note (Signed)
Stable.  Follows with dermatology once yearly.

## 2018-08-07 ENCOUNTER — Other Ambulatory Visit: Payer: Self-pay | Admitting: Family Medicine

## 2018-08-07 ENCOUNTER — Other Ambulatory Visit: Payer: Self-pay | Admitting: Internal Medicine

## 2018-08-09 ENCOUNTER — Other Ambulatory Visit: Payer: Self-pay | Admitting: Family Medicine

## 2018-08-10 ENCOUNTER — Other Ambulatory Visit: Payer: Self-pay

## 2018-08-10 MED ORDER — METOPROLOL TARTRATE 25 MG PO TABS: 25.0000 mg | ORAL_TABLET | Freq: Two times a day (BID) | ORAL | 3 refills | Status: DC

## 2018-08-10 MED ORDER — METFORMIN HCL 500 MG PO TABS: ORAL_TABLET | ORAL | 3 refills | Status: DC

## 2018-08-10 MED ORDER — FLUOXETINE HCL 20 MG PO TABS: 20.0000 mg | ORAL_TABLET | Freq: Every day | ORAL | 3 refills | Status: DC

## 2018-08-10 MED ORDER — CLOPIDOGREL BISULFATE 75 MG PO TABS: 75.0000 mg | ORAL_TABLET | Freq: Every day | ORAL | 3 refills | Status: DC

## 2018-08-28 ENCOUNTER — Encounter: Payer: Self-pay | Admitting: Family Medicine

## 2018-08-29 ENCOUNTER — Other Ambulatory Visit: Payer: Self-pay

## 2018-08-29 DIAGNOSIS — M545 Low back pain, unspecified: Secondary | ICD-10-CM

## 2018-08-31 ENCOUNTER — Ambulatory Visit (INDEPENDENT_AMBULATORY_CARE_PROVIDER_SITE_OTHER): Payer: Medicare HMO | Admitting: Sports Medicine

## 2018-08-31 ENCOUNTER — Encounter: Payer: Self-pay | Admitting: Sports Medicine

## 2018-08-31 VITALS — BP 114/78 | HR 53 | Ht 71.0 in | Wt 247.8 lb

## 2018-08-31 DIAGNOSIS — M545 Low back pain, unspecified: Secondary | ICD-10-CM

## 2018-08-31 DIAGNOSIS — M24551 Contracture, right hip: Secondary | ICD-10-CM | POA: Diagnosis not present

## 2018-08-31 DIAGNOSIS — M9903 Segmental and somatic dysfunction of lumbar region: Secondary | ICD-10-CM

## 2018-08-31 DIAGNOSIS — M9905 Segmental and somatic dysfunction of pelvic region: Secondary | ICD-10-CM | POA: Diagnosis not present

## 2018-08-31 DIAGNOSIS — R29898 Other symptoms and signs involving the musculoskeletal system: Secondary | ICD-10-CM

## 2018-08-31 DIAGNOSIS — M9904 Segmental and somatic dysfunction of sacral region: Secondary | ICD-10-CM

## 2018-08-31 MED ORDER — TRAMADOL HCL 50 MG PO TABS: 50.0000 mg | ORAL_TABLET | Freq: Four times a day (QID) | ORAL | 0 refills | Status: AC | PRN

## 2018-08-31 NOTE — Patient Instructions (Signed)
Please perform the exercise program that we have prepared for you and gone over in detail on a daily basis.  In addition to the handout you were provided you can access your program through: [email or web address]   Your unique program code is: [facility]   Also check out [employer]" which is a program developed by Dr. Minerva Ends.   There are links to a couple of his YouTube Videos below and I would like to see performing one of his videos 5-6 days per week.    A good intro video is: "Independence from Pain 7-minute Video" - [email or web address]   Exercises that focus more on the neck are as below: Dr. Archie Balboa with [employer] teaching neck and shoulder details Part 1 - https://youtu.be/[email or web address] Part 2 Dr. Archie Balboa with [facility] quick routine to practice daily - https://youtu.be/[email or web address]  Do not try to attempt the entire video when first beginning.    Try breaking of each exercise that he goes into shorter segments.  Otherwise if they perform an exercise for 45 seconds, start with 15 seconds and rest and then resume when they begin the new activity.  If you work your way up to being able to do these videos without having to stop, I expect you will see significant improvements in your pain.  If you enjoy his videos and would like to find out more you can look on his website: [email or web address]  He has a workout streaming option as well as a DVD set available for purchase.  Amazon has the best price for his DVDs.

## 2018-08-31 NOTE — Progress Notes (Signed)
Brent Taylor. Brent Taylor, [place] at [place] - 77 y.o. male MRN [id number]  Date of birth: 02/28/1941  Visit Date: 08/31/2018  PCP: [name], MD   Referred by: [name], MD   Scribe(s) for today's visit: Wendy Poet, LAT, ATC  SUBJECTIVE:  Brent Rams "Win" is here for New Patient (Initial Visit) (Back pain)  Referred by: Dr. [name]  HPI: His back pain symptoms INITIALLY: Began years ago w/ no known MOI and was intermittent in nature.  He states that he did see a physician a few years ago who told him he had some DJD and disc compression and referred him to PT.  He reports that 3 weeks ago the pain became constant. Described as mod-severe pain, radiating to R ant leg, stopping at his ankle Worsened with standing and walking; lying down Improved with sitting Additional associated symptoms include: no N/T noted in R LE; no change in B/B habits    At this time symptoms are worsening compared to onset.  He has been doing his HEP as prescribed by prior PT.  He has been using heat and taking IBU.  REVIEW OF SYSTEMS: Reports night time disturbances. Denies fevers, chills, or night sweats. Denies unexplained weight loss. Reports personal history of cancer.  Melanoma on his chest - 2000 Denies changes in bowel or bladder habits. Denies recent unreported falls. Denies new or worsening dyspnea or wheezing. Denies headaches or dizziness.  Denies numbness, tingling or weakness  In the extremities.  Denies dizziness or presyncopal episodes Denies lower extremity edema    HISTORY:  Prior history reviewed and updated per electronic medical record.  Social History   Occupational History  . Occupation: Company secretary  Tobacco Use  . Smoking status: Never Smoker  . Smokeless tobacco: Never Used  Substance and Sexual Activity  . Alcohol use: No  . Drug use: No  . Sexual activity: Not on file   Social  History   Social History Narrative   Lives with wife.      Past Medical History:  Diagnosis Date  . Cancer (HCC)    hx - melanoma  . Chicken pox   . Colon polyps   . Depression    No need for therapy.  Improved  . Diverticula, colon   . Hemorrhoids   . Hyperlipidemia   . Hypertension   . Sepsis ([place])   . UTI (lower urinary tract infection)    Past Surgical History:  Procedure Laterality Date  . MELANOMA EXCISION     LEFT CHEST  . MELANOMA EXCISION     family history includes Heart Problems in his mother.  DATA OBTAINED & REVIEWED:   Recent Labs    10/25/17 0925 07/11/18 0925  HGBA1C 6.4 5.9*   No problems updated. .  .   OBJECTIVE:  VS:  HT:5\' 11"  (180.3 cm)   WT:247 lb 12.8 oz (112.4 kg)  BMI:34.58    BP:114/78  HR:(!) 53bpm  TEMP: ( )  RESP:96 %   PHYSICAL EXAM: CONSTITUTIONAL: Well-developed, Well-nourished and In no acute distress PSYCHIATRIC: Alert & appropriately interactive. and Not depressed or anxious appearing. RESPIRATORY: No increased work of breathing and Trachea Midline EYES: Pupils are equal., EOM intact without nystagmus. and No scleral icterus.  VASCULAR EXAM: Warm and well perfused NEURO: unremarkable  MSK Exam: BACK Exam: Normal alignment & Contours Skin: No overlying erythema/ecchymosis  MOTOR TESTING: Intact in all LE myotomes  and Able to heel and toe walk without difficutly        RIGHT    LEFT Straight leg raise-------------------------: normal, no pain                         normal, no pain Braggard Stretch Test------------------: normal, no pain                         normal, no pain Slump Sign--------------------------------: normal, no pain, tight                         normal, no pain, tight Popliteal compression test------------: normal, no pain                         normal, no pain Greater sciatic notch tenderness----: normal, no pain                         normal, no pain   REFLEXES Right Left  DTR -  L3/4 -Patellar 1+ 1+  DTR - L5/S1 - Achilles 1+ 1+    ASSESSMENT   1. Low back pain, unspecified back pain laterality, unspecified chronicity, unspecified whether sciatica present   2. Somatic dysfunction of lumbar region   3. Somatic dysfunction of pelvis region   4. Somatic dysfunction of sacral region   5. Right hip flexor tightness   6. Weakness of right hip     PLAN:  Pertinent additional documentation may be included in corresponding procedure notes, imaging studies, problem based documentation and patient instructions.  Procedures:  . Osteopathic manipulation was performed today based on physical exam findings.  Please see procedure note for further information including Osteopathic Exam findings  Medications:  No orders of the defined types were placed in this encounter.  Discussion/Instructions: No problem-specific Assessment & Plan notes found for this encounter.  . functional low back pain secondary to tight hip flexors . good response to omt today.  Discussed the underlying features of tight hip flexors leading to crouched, fetal like position that results in spinal column compression.  Including lumbar hyperflexion with hypermobility, thoracic flexion with restrictive rotation and cervical lordosis reversal  . Links to [employer] provided today per Patient Instructions.  These exercises were developed by Minerva Ends, DC with a strong emphasis on core neuromuscular reducation and postural realignment through body-weight exercises. . Discussed red flag symptoms that warrant earlier emergent evaluation and patient voices understanding. . Activity modifications and the importance of avoiding exacerbating activities (limiting pain to no more than a 4 / 10 during or following activity) recommended and discussed.  Follow-up:  . Return in about 2 weeks (around 09/14/2018).  . If any lack of improvement: consider further diagnostic evaluation with X-ray lumbar  spine . At follow up will plan: to consider repeat osteopathic manipulation     CMA/ATC served as scribe during this visit. History, Physical, and Plan performed by medical provider. Documentation and orders reviewed and attested to.      Gerda Diss, [facility] Physician

## 2018-08-31 NOTE — Progress Notes (Signed)
PROCEDURE NOTE : OSTEOPATHIC MANIPULATION The decision today to treat with Osteopathic Manipulative Therapy (OMT) was based on physical exam findings. Verbal consent was obtained following a discussion with the patient regarding the of risks, benefits and potential side effects, including an acute pain flare,post manipulation soreness and need for repeat treatments.     Contraindications to OMT: NONE  Manipulation was performed as below: Regions Treated OMT Techniques Used  1. Lumbar spine 2. Pelvis 3. Sacrum 4. Lower extremities . HVLA . muscle energy . myofascial release . soft tissue   The patient tolerated the treatment well and reported Improved symptoms following treatment today. Patient was given medications, exercises, stretches and lifestyle modifications per AVS and verbally.   OSTEOPATHIC/STRUCTURAL EXAM:   L4 FRS right (Flexed, Rotated & Sidebent) Right psoas spasm Right anterior innonimate L on L sacral torsion right internally right hip

## 2018-08-31 NOTE — Progress Notes (Signed)
PROCEDURE NOTE: THERAPEUTIC EXERCISES (97110) 15 minutes spent for Therapeutic exercises as below and as referenced in the AVS.  This included exercises focusing on stretching, strengthening, with significant focus on eccentric aspects.   Proper technique shown and discussed handout in great detail with ATC.  All questions were discussed and answered.   Long term goals include an improvement in range of motion, strength, endurance as well as avoiding reinjury. Frequency of visits is one time as determined during today's  office visit. Frequency of exercises to be performed is as per handout.  EXERCISES REVIEWED:  Brent Taylor Exercises  Pelvic recruitment/tilting  Hip flexor stretching

## 2018-09-15 ENCOUNTER — Ambulatory Visit (INDEPENDENT_AMBULATORY_CARE_PROVIDER_SITE_OTHER): Payer: Medicare HMO | Admitting: Sports Medicine

## 2018-09-15 ENCOUNTER — Encounter: Payer: Self-pay | Admitting: Sports Medicine

## 2018-09-15 VITALS — BP 120/76 | HR 51 | Ht 71.0 in | Wt 245.2 lb

## 2018-09-15 DIAGNOSIS — M545 Low back pain, unspecified: Secondary | ICD-10-CM

## 2018-09-15 NOTE — Progress Notes (Signed)
  Brent Taylor. Brent Taylor, [place] at [place] - 77 y.o. male MRN [id number]  Date of birth: 30-Mar-1941  Visit Date: 09/15/2018  PCP: [name], MD   Referred by: [name], MD   SUBJECTIVE:   Chief Complaint  Patient presents with  . Follow-up    Low back pain    HPI: He is overall doing quite well and having only minimal symptoms at this time.  He is been performing his home therapeutic exercises and responded well to osteopathic manipulation at last visit but did have some soreness with this.  REVIEW OF SYSTEMS: Negative red flag  HISTORY:  Prior history reviewed and updated per electronic medical record.  Social History   Occupational History  . Occupation: Company secretary  Tobacco Use  . Smoking status: Never Smoker  . Smokeless tobacco: Never Used  Substance and Sexual Activity  . Alcohol use: No  . Drug use: No  . Sexual activity: Not on file   Social History   Social History Narrative   Lives with wife.        DATA OBTAINED & REVIEWED:   Recent Labs    10/25/17 0925 07/11/18 0925  HGBA1C 6.4 5.9*  CALCIUM 9.3  --   AST 19  --   ALT 21  --   TSH 1.67  --    No problems updated. No specialty comments available.  OBJECTIVE:  VS:  HT:5\' 11"  (180.3 cm)   WT:245 lb 3.2 oz (111.2 kg)  BMI:34.21    BP:120/76  HR:(!) 51bpm  TEMP: ( )  RESP:95 %   PHYSICAL EXAM: CONSTITUTIONAL: Well-developed, Well-nourished and In no acute distress PSYCHIATRIC: Alert & appropriately interactive. and Not depressed or anxious appearing. RESPIRATORY: No increased work of breathing and Trachea Midline EYES: Pupils are equal., EOM intact without nystagmus. and No scleral icterus.  VASCULAR EXAM: Warm and well perfused NEURO: unremarkable  MSK Exam:   lumbar spine  Well aligned No significant deformity. No overlying skin changes No focal bony tenderness Lower extremity strength is  otherwise intact.  He is able heel toe walk without difficulty. Negative straight leg raise bilaterally.  He has limited hip internal range of motion bilaterally but this is symmetric and pain-free.    ASSESSMENT   1. Low back pain, unspecified back pain laterality, unspecified chronicity, unspecified whether sciatica present     PLAN:  Pertinent additional documentation may be included in corresponding procedure notes, imaging studies, problem based documentation and patient instructions.  Procedures:  None  Medications:  No orders of the defined types were placed in this encounter.   Discussion/Instructions: No problem-specific Assessment & Plan notes found for this encounter.  Discussed red flag symptoms that warrant earlier emergent evaluation and patient voices understanding. Activity modifications and the importance of avoiding exacerbating activities (limiting pain to no more than a 4 / 10 during or following activity) recommended and discussed. Overall he is done quite well following manipulation and home therapeutic exercises.  He has been able to wean off of medications and has only minimal symptoms intermittently. If any lack of improvement: consider further diagnostic evaluation with X-rays of the hips and/or lumbar spine  At follow up will plan: to consider repeat osteopathic manipulation Return if symptoms worsen or fail to improve.          Brent Taylor, [place] [facility] Physician

## 2018-10-06 ENCOUNTER — Other Ambulatory Visit: Payer: Self-pay | Admitting: Internal Medicine

## 2018-10-20 DIAGNOSIS — L814 Other melanin hyperpigmentation: Secondary | ICD-10-CM | POA: Diagnosis not present

## 2018-10-20 DIAGNOSIS — D2272 Melanocytic nevi of left lower limb, including hip: Secondary | ICD-10-CM | POA: Diagnosis not present

## 2018-10-20 DIAGNOSIS — Z85828 Personal history of other malignant neoplasm of skin: Secondary | ICD-10-CM | POA: Diagnosis not present

## 2018-10-20 DIAGNOSIS — L821 Other seborrheic keratosis: Secondary | ICD-10-CM | POA: Diagnosis not present

## 2018-10-20 DIAGNOSIS — D692 Other nonthrombocytopenic purpura: Secondary | ICD-10-CM | POA: Diagnosis not present

## 2018-10-20 DIAGNOSIS — D1801 Hemangioma of skin and subcutaneous tissue: Secondary | ICD-10-CM | POA: Diagnosis not present

## 2018-10-20 DIAGNOSIS — Z85820 Personal history of malignant melanoma of skin: Secondary | ICD-10-CM | POA: Diagnosis not present

## 2018-10-20 DIAGNOSIS — D224 Melanocytic nevi of scalp and neck: Secondary | ICD-10-CM | POA: Diagnosis not present

## 2018-10-20 DIAGNOSIS — M71372 Other bursal cyst, left ankle and foot: Secondary | ICD-10-CM | POA: Diagnosis not present

## 2018-10-20 DIAGNOSIS — L570 Actinic keratosis: Secondary | ICD-10-CM | POA: Diagnosis not present

## 2018-10-30 ENCOUNTER — Ambulatory Visit (INDEPENDENT_AMBULATORY_CARE_PROVIDER_SITE_OTHER): Payer: Medicare HMO | Admitting: Family Medicine

## 2018-10-30 ENCOUNTER — Encounter: Payer: Self-pay | Admitting: Family Medicine

## 2018-10-30 VITALS — BP 124/72 | HR 56 | Temp 98.3°F | Ht 71.0 in | Wt 249.2 lb

## 2018-10-30 DIAGNOSIS — Z6834 Body mass index (BMI) 34.0-34.9, adult: Secondary | ICD-10-CM

## 2018-10-30 DIAGNOSIS — Z125 Encounter for screening for malignant neoplasm of prostate: Secondary | ICD-10-CM

## 2018-10-30 DIAGNOSIS — Z0001 Encounter for general adult medical examination with abnormal findings: Secondary | ICD-10-CM

## 2018-10-30 DIAGNOSIS — I2510 Atherosclerotic heart disease of native coronary artery without angina pectoris: Secondary | ICD-10-CM

## 2018-10-30 DIAGNOSIS — I152 Hypertension secondary to endocrine disorders: Secondary | ICD-10-CM

## 2018-10-30 DIAGNOSIS — R69 Illness, unspecified: Secondary | ICD-10-CM | POA: Diagnosis not present

## 2018-10-30 DIAGNOSIS — I10 Essential (primary) hypertension: Secondary | ICD-10-CM | POA: Diagnosis not present

## 2018-10-30 DIAGNOSIS — C439 Malignant melanoma of skin, unspecified: Secondary | ICD-10-CM | POA: Diagnosis not present

## 2018-10-30 DIAGNOSIS — I5042 Chronic combined systolic (congestive) and diastolic (congestive) heart failure: Secondary | ICD-10-CM | POA: Diagnosis not present

## 2018-10-30 DIAGNOSIS — E785 Hyperlipidemia, unspecified: Secondary | ICD-10-CM

## 2018-10-30 DIAGNOSIS — E1169 Type 2 diabetes mellitus with other specified complication: Secondary | ICD-10-CM

## 2018-10-30 DIAGNOSIS — K219 Gastro-esophageal reflux disease without esophagitis: Secondary | ICD-10-CM | POA: Diagnosis not present

## 2018-10-30 DIAGNOSIS — E1159 Type 2 diabetes mellitus with other circulatory complications: Secondary | ICD-10-CM

## 2018-10-30 DIAGNOSIS — F325 Major depressive disorder, single episode, in full remission: Secondary | ICD-10-CM

## 2018-10-30 LAB — COMPREHENSIVE METABOLIC PANEL WITH GFR
ALT: 31 U/L (ref 0–53)
AST: 31 U/L (ref 0–37)
Albumin: 4.3 g/dL (ref 3.5–5.2)
Alkaline Phosphatase: 54 U/L (ref 39–117)
BUN: 19 mg/dL (ref 6–23)
CO2: 27 meq/L (ref 19–32)
Calcium: 9.5 mg/dL (ref 8.4–10.5)
Chloride: 104 meq/L (ref 96–112)
Creatinine, Ser: 1.18 mg/dL (ref 0.40–1.50)
GFR: 59.4 mL/min — ABNORMAL LOW
Glucose, Bld: 122 mg/dL — ABNORMAL HIGH (ref 70–99)
Potassium: 4.6 meq/L (ref 3.5–5.1)
Sodium: 141 meq/L (ref 135–145)
Total Bilirubin: 0.7 mg/dL (ref 0.2–1.2)
Total Protein: 6.8 g/dL (ref 6.0–8.3)

## 2018-10-30 LAB — LIPID PANEL
Cholesterol: 163 mg/dL (ref 0–200)
HDL: 34.1 mg/dL — ABNORMAL LOW (ref >39.00)
LDL Cholesterol: 90 mg/dL (ref 0–99)
NonHDL: 129.22
Total CHOL/HDL Ratio: 5
Triglycerides: 198 mg/dL — ABNORMAL HIGH (ref 0.0–149.0)
VLDL: 39.6 mg/dL (ref 0.0–40.0)

## 2018-10-30 LAB — CBC
HCT: 48.3 % (ref 39.0–52.0)
Hemoglobin: 15.9 g/dL (ref 13.0–17.0)
MCHC: 32.9 g/dL (ref 30.0–36.0)
MCV: 87.9 fl (ref 78.0–100.0)
Platelets: 205 10*3/uL (ref 150.0–400.0)
RBC: 5.5 Mil/uL (ref 4.22–5.81)
RDW: 14.8 % (ref 11.5–15.5)
WBC: 5.2 10*3/uL (ref 4.0–10.5)

## 2018-10-30 LAB — TSH: TSH: 1.84 u[IU]/mL (ref 0.35–4.50)

## 2018-10-30 LAB — HEMOGLOBIN A1C: HEMOGLOBIN A1C: 6.4 % (ref 4.6–6.5)

## 2018-10-30 LAB — PSA, MEDICARE: PSA: 0.43 ng/ml (ref 0.10–4.00)

## 2018-10-30 NOTE — Assessment & Plan Note (Signed)
At goal.  Continue Norvasc 10 mg daily, quinapril 40 mg daily, and metoprolol 25 mg twice daily. 

## 2018-10-30 NOTE — Assessment & Plan Note (Signed)
Stable.  Continue follow-up with dermatology.

## 2018-10-30 NOTE — Assessment & Plan Note (Signed)
Check A1c with blood draw.  Continue metformin 500 mg daily.

## 2018-10-30 NOTE — Assessment & Plan Note (Signed)
Continue Plavix 75 mg daily and pravastatin 20 mg daily.

## 2018-10-30 NOTE — Assessment & Plan Note (Signed)
No signs of volume overload.  Continue above medications and salt avoidance.

## 2018-10-30 NOTE — Patient Instructions (Signed)
It was very nice to see you today!  Keep up the good work!  No medication changes today.  We will check blood work.  Come back to see me in 6 months for diabetes follow up, or sooner as needed.   Take care, Dr [name]   Preventive Care 74 Years and Older, Male Preventive care refers to lifestyle choices and visits with your health care provider that can promote health and wellness. What does preventive care include?   A yearly physical exam. This is also called an annual well check.  Dental exams once or twice a year.  Routine eye exams. Ask your health care provider how often you should have your eyes checked.  Personal lifestyle choices, including: ? Daily care of your teeth and gums. ? Regular physical activity. ? Eating a healthy diet. ? Avoiding tobacco and drug use. ? Limiting alcohol use. ? Practicing safe sex. ? Taking low doses of aspirin every day. ? Taking vitamin and mineral supplements as recommended by your health care provider. What happens during an annual well check? The services and screenings done by your health care provider during your annual well check will depend on your age, overall health, lifestyle risk factors, and family history of disease. Counseling Your health care provider may ask you questions about your:  Alcohol use.  Tobacco use.  Drug use.  Emotional well-being.  Home and relationship well-being.  Sexual activity.  Eating habits.  History of falls.  Memory and ability to understand (cognition).  Work and work Statistician. Screening You may have the following tests or measurements:  Height, weight, and BMI.  Blood pressure.  Lipid and cholesterol levels. These may be checked every 5 years, or more frequently if you are over 79 years old.  Skin check.  Lung cancer screening. You may have this screening every year starting at age 6 if you have a 30-pack-year history of smoking and currently smoke or have quit  within the past 15 years.  Colorectal cancer screening. All adults should have this screening starting at age 80 and continuing until age 53. You will have tests every 1-10 years, depending on your results and the type of screening test. People at increased risk should start screening at an earlier age. Screening tests may include: ? Guaiac-based fecal occult blood testing. ? Fecal immunochemical test (FIT). ? Stool DNA test. ? Virtual colonoscopy. ? Sigmoidoscopy. During this test, a flexible tube with a tiny camera (sigmoidoscope) is used to examine your rectum and lower colon. The sigmoidoscope is inserted through your anus into your rectum and lower colon. ? Colonoscopy. During this test, a long, thin, flexible tube with a tiny camera (colonoscope) is used to examine your entire colon and rectum.  Prostate cancer screening. Recommendations will vary depending on your family history and other risks.  Hepatitis C blood test.  Hepatitis B blood test.  Sexually transmitted disease (STD) testing.  Diabetes screening. This is done by checking your blood sugar (glucose) after you have not eaten for a while (fasting). You may have this done every 1-3 years.  Abdominal aortic aneurysm (AAA) screening. You may need this if you are a current or former smoker.  Osteoporosis. You may be screened starting at age 65 if you are at high risk. Talk with your health care provider about your test results, treatment options, and if necessary, the need for more tests. Vaccines Your health care provider may recommend certain vaccines, such as:  Influenza vaccine. This is recommended  every year.  Tetanus, diphtheria, and acellular pertussis (Tdap, Td) vaccine. You may need a Td booster every 10 years.  Varicella vaccine. You may need this if you have not been vaccinated.  Zoster vaccine. You may need this after age 68.  Measles, mumps, and rubella (MMR) vaccine. You may need at least one dose of MMR  if you were born in 1957 or later. You may also need a second dose.  Pneumococcal 13-valent conjugate (PCV13) vaccine. One dose is recommended after age 60.  Pneumococcal polysaccharide (PPSV23) vaccine. One dose is recommended after age 63.  Meningococcal vaccine. You may need this if you have certain conditions.  Hepatitis A vaccine. You may need this if you have certain conditions or if you travel or work in places where you may be exposed to hepatitis A.  Hepatitis B vaccine. You may need this if you have certain conditions or if you travel or work in places where you may be exposed to hepatitis B.  Haemophilus influenzae type b (Hib) vaccine. You may need this if you have certain risk factors. Talk to your health care provider about which screenings and vaccines you need and how often you need them. This information is not intended to replace advice given to you by your health care provider. Make sure you discuss any questions you have with your health care provider. Document Released: 10/24/2015 Document Revised: 11/17/2017 Document Reviewed: 07/29/2015 Elsevier Interactive Patient Education  2019 [employer].

## 2018-10-30 NOTE — Progress Notes (Signed)
Subjective:  Brent Taylor is a 78 y.o. male who presents today for his annual comprehensive physical exam.    HPI:  He has no acute complaints today.   His stable, chronic medical conditions are outlined below:  # T2DM -Currently on metformin 500 mg daily and tolerating well. -No polyuria or polydipsia  # Hypertension / HFrEF / CAD -Currently on Norvasc 10 mg daily, quinapril 40 mg daily, metoprolol 25 mg twice daily.  Tolerating all these well without side effects. -On plavix 75mg  daily -No chest pain or shortness of breath  #Dyslipidemia / Coronary Artery Disease -Currently on pravastatin 20 mg daily entirely well without side effects. -No myalgias  #Depression -Currently on Prozac 20 mg daily and doing well.  # GERD -Currently on Protonix 20 mg daily and tolerating well.  % History of Melanoma s/p excision in 2000 - Follows with dermatology  Lifestyle Diet: No specific diets Exercise: [place] a few days per week.   Depression screen [facility] 2/9 10/30/2018  Decreased Interest 0  Down, Depressed, Hopeless 0  PHQ - 2 Score 0  Altered sleeping 0  Tired, decreased energy 1  Change in appetite 1  Feeling bad or failure about yourself  0  Trouble concentrating 0  Moving slowly or fidgety/restless 0  Suicidal thoughts 0  PHQ-9 Score 2  Difficult doing work/chores Not difficult at all    Health Maintenance Due  Topic Date Due  . FOOT EXAM  04/20/2018     ROS: Per HPI, otherwise a complete review of systems was negative.   PMH:  The following were reviewed and entered/updated in epic: Past Medical History:  Diagnosis Date  . Cancer (HCC)    hx - melanoma  . Chicken pox   . Colon polyps   . Depression    No need for therapy.  Improved  . Diverticula, colon   . Hemorrhoids   . Hyperlipidemia   . Hypertension   . Sepsis ([place])   . Tinnitus   . UTI (lower urinary tract infection)    Patient Active Problem List   Diagnosis Date Noted  . Type 2  diabetes mellitus with vascular disease ([place]) 10/20/2016  . Hypertension associated with diabetes ([place]) 11/20/2015  . Chronic combined systolic and diastolic heart failure ([place])   . Coronary artery disease 12/10/2011  . Dyslipidemia associated with type 2 diabetes mellitus ([place]) 12/10/2011  . Major depression in remission ([place]) 12/10/2011  . GERD (gastroesophageal reflux disease) 12/10/2011  . Hx of colonic polyps 12/10/2011  . Melanoma ([place]) 12/10/2011   Past Surgical History:  Procedure Laterality Date  . MELANOMA EXCISION     LEFT CHEST  . MELANOMA EXCISION      Family History  Problem Relation Age of Onset  . Heart Problems Mother        Skipping    Medications- reviewed and updated Current Outpatient Medications  Medication Sig Dispense Refill  . amLODipine (NORVASC) 10 MG tablet TAKE ONE TABLET BY MOUTH DAILY 90 tablet 2  . clopidogrel (PLAVIX) 75 MG tablet Take 1 tablet (75 mg total) by mouth daily. 90 tablet 3  . FLUoxetine (PROZAC) 20 MG tablet Take 1 tablet (20 mg total) by mouth daily. 90 tablet 3  . ibuprofen (ADVIL,MOTRIN) 200 MG tablet Take 600 mg by mouth every 6 (six) hours as needed for moderate pain.     . metFORMIN (GLUCOPHAGE) 500 MG tablet TAKE ONE TABLET BY MOUTH EVERY MORNING WITH BREAKFAST 90 tablet 3  .  metoprolol tartrate (LOPRESSOR) 25 MG tablet Take 1 tablet (25 mg total) by mouth 2 (two) times daily. 180 tablet 3  . pantoprazole (PROTONIX) 20 MG tablet TAKE ONE TABLET BY MOUTH DAILY 90 tablet 0  . pravastatin (PRAVACHOL) 20 MG tablet TAKE ONE TABLET BY MOUTH DAILY 90 tablet 2  . quinapril (ACCUPRIL) 40 MG tablet TAKE ONE TABLET (40MG  TOTAL)  BY MOUTH DAILY 90 tablet 0   No current facility-administered medications for this visit.     Allergies-reviewed and updated Allergies  Allergen Reactions  . Lipitor [Atorvastatin Calcium]     Muscle ache    Social History   Socioeconomic History  . Marital status: Married    Spouse name: Not on file    . Number of children: 2  . Years of education: Not on file  . Highest education level: Not on file  Occupational History  . Occupation: Company secretary  Social Needs  . Financial resource strain: Not on file  . Food insecurity:    Worry: Not on file    Inability: Not on file  . Transportation needs:    Medical: Not on file    Non-medical: Not on file  Tobacco Use  . Smoking status: Never Smoker  . Smokeless tobacco: Never Used  Substance and Sexual Activity  . Alcohol use: No  . Drug use: No  . Sexual activity: Not on file  Lifestyle  . Physical activity:    Days per week: Not on file    Minutes per session: Not on file  . Stress: Not on file  Relationships  . Social connections:    Talks on phone: Not on file    Gets together: Not on file    Attends religious service: Not on file    Active member of club or organization: Not on file    Attends meetings of clubs or organizations: Not on file    Relationship status: Not on file  Other Topics Concern  . Not on file  Social History Narrative   Lives with wife.      Objective:  Physical Exam: BP 124/72 (BP Location: Left Arm, Patient Position: Sitting, Cuff Size: Normal)   Pulse (!) 56   Temp 98.3 F (36.8 C) (Oral)   Ht 5\' 11"  (1.803 m)   Wt 249 lb 3.2 oz (113 kg)   SpO2 95%   BMI 34.76 kg/m   Body mass index is 34.76 kg/m. Wt Readings from Last 3 Encounters:  10/30/18 249 lb 3.2 oz (113 kg)  09/15/18 245 lb 3.2 oz (111.2 kg)  08/31/18 247 lb 12.8 oz (112.4 kg)   Gen: NAD, resting comfortably HEENT: TMs normal bilaterally. OP clear. No thyromegaly noted.  CV: RRR with no murmurs appreciated Pulm: NWOB, CTAB with no crackles, wheezes, or rhonchi GI: Normal bowel sounds present. Soft, Nontender, Nondistended. MSK: no edema, cyanosis, or clubbing noted Skin: warm, dry Neuro: CN2-12 grossly intact. Strength 5/5 in upper and lower extremities. Reflexes symmetric and intact bilaterally.  Psych: Normal affect and  thought content  Assessment/Plan:  Type 2 diabetes mellitus with vascular disease (HCC) Check A1c with blood draw.  Continue metformin 500 mg daily.  Melanoma Stable.  Continue follow-up with dermatology.  Major depression in remission ([place]) Stable.  Continue Prozac 20 mg daily.  Hypertension associated with diabetes ([place]) At goal.  Continue Norvasc 10 mg daily, quinapril 40 mg daily, and metoprolol 25 mg twice daily.  GERD (gastroesophageal reflux disease) Stable.  Continue Protonix 20 mg  daily.  Dyslipidemia associated with type 2 diabetes mellitus ([place]) Check lipid panel.  Continue pravastatin 20 mg daily.  Coronary artery disease Continue Plavix 75 mg daily and pravastatin 20 mg daily.  Chronic combined systolic and diastolic heart failure (HCC) No signs of volume overload.  Continue above medications and salt avoidance.  Preventative Healthcare: Check PSA.  Foot exam today.  Up-to-date on other preventative care measures.  Patient Counseling(The following topics were reviewed and/or handout was given):  -Nutrition: Stressed importance of moderation in sodium/caffeine intake, saturated fat and cholesterol, caloric balance, sufficient intake of fresh fruits, vegetables, and fiber.  -Stressed the importance of regular exercise.   -Substance Abuse: Discussed cessation/primary prevention of tobacco, alcohol, or other drug use; driving or other dangerous activities under the influence; availability of treatment for abuse.   -Injury prevention: Discussed safety belts, safety helmets, smoke detector, smoking near bedding or upholstery.   -Sexuality: Discussed sexually transmitted diseases, partner selection, use of condoms, avoidance of unintended pregnancy and contraceptive alternatives.   -Dental health: Discussed importance of regular tooth brushing, flossing, and dental visits.  -Health maintenance and immunizations reviewed. Please refer to Health maintenance section.  Return  to care in 1 year for next preventative visit.   Algis Greenhouse. [name], MD 10/30/2018 11:56 AM

## 2018-10-30 NOTE — Assessment & Plan Note (Signed)
Stable.  Continue Protonix 20 mg daily. 

## 2018-10-30 NOTE — Assessment & Plan Note (Signed)
Check lipid panel.  Continue pravastatin 20 mg daily. 

## 2018-10-30 NOTE — Assessment & Plan Note (Addendum)
Stable.  Continue Prozac 20 mg daily. 

## 2018-11-01 NOTE — Progress Notes (Signed)
Please inform patient of the following:  PSA (prostate cancer screening test) is normal Thyroid level is normal. Blood counts are normal.  Electrolytes, kidney function and liver function are normal. HIs A1c is up just a bit but is still well within goal at 6.4. Cholesterol numbers are stable as well.  Do not need to make any changes to his meds. Would like for him to keep up the good work and we will see him again for diabetes follow up in about 6 months.  Brent Taylor. [name], MD 11/01/2018 8:08 AM

## 2018-12-19 ENCOUNTER — Other Ambulatory Visit: Payer: Self-pay

## 2018-12-19 ENCOUNTER — Encounter: Payer: Self-pay | Admitting: Family Medicine

## 2018-12-19 MED ORDER — QUINAPRIL HCL 40 MG PO TABS: ORAL_TABLET | ORAL | 0 refills | Status: DC

## 2019-01-04 ENCOUNTER — Other Ambulatory Visit: Payer: Self-pay | Admitting: Family Medicine

## 2019-01-04 ENCOUNTER — Encounter: Payer: Self-pay | Admitting: Family Medicine

## 2019-01-05 ENCOUNTER — Other Ambulatory Visit: Payer: Self-pay

## 2019-01-05 DIAGNOSIS — R69 Illness, unspecified: Secondary | ICD-10-CM | POA: Diagnosis not present

## 2019-01-05 MED ORDER — ONETOUCH ULTRA 2 W/DEVICE KIT: PACK | 0 refills | Status: DC

## 2019-01-19 ENCOUNTER — Encounter: Payer: Self-pay | Admitting: Family Medicine

## 2019-01-22 ENCOUNTER — Other Ambulatory Visit: Payer: Self-pay

## 2019-01-22 DIAGNOSIS — R69 Illness, unspecified: Secondary | ICD-10-CM | POA: Diagnosis not present

## 2019-01-22 MED ORDER — ONETOUCH ULTRASOFT LANCETS MISC: 12 refills | Status: DC

## 2019-01-22 MED ORDER — GLUCOSE BLOOD VI STRP: ORAL_STRIP | 12 refills | Status: DC

## 2019-01-24 DIAGNOSIS — R69 Illness, unspecified: Secondary | ICD-10-CM | POA: Diagnosis not present

## 2019-01-29 ENCOUNTER — Other Ambulatory Visit: Payer: Self-pay

## 2019-01-29 MED ORDER — ONETOUCH DELICA LANCETS 33G MISC: 11 refills | Status: DC

## 2019-02-05 ENCOUNTER — Encounter: Payer: Self-pay | Admitting: Family Medicine

## 2019-02-05 ENCOUNTER — Other Ambulatory Visit: Payer: Self-pay

## 2019-02-05 MED ORDER — AMLODIPINE BESYLATE 10 MG PO TABS: 10.0000 mg | ORAL_TABLET | Freq: Every day | ORAL | 2 refills | Status: DC

## 2019-02-05 MED ORDER — PRAVASTATIN SODIUM 20 MG PO TABS: 20.0000 mg | ORAL_TABLET | Freq: Every day | ORAL | 2 refills | Status: DC

## 2019-02-10 DIAGNOSIS — R69 Illness, unspecified: Secondary | ICD-10-CM | POA: Diagnosis not present

## 2019-03-22 ENCOUNTER — Encounter: Payer: Self-pay | Admitting: Family Medicine

## 2019-03-23 ENCOUNTER — Other Ambulatory Visit: Payer: Self-pay

## 2019-03-23 MED ORDER — QUINAPRIL HCL 40 MG PO TABS: ORAL_TABLET | ORAL | 0 refills | Status: DC

## 2019-04-02 DIAGNOSIS — R69 Illness, unspecified: Secondary | ICD-10-CM | POA: Diagnosis not present

## 2019-04-04 ENCOUNTER — Other Ambulatory Visit: Payer: Self-pay | Admitting: Family Medicine

## 2019-04-27 ENCOUNTER — Ambulatory Visit: Payer: Medicare HMO | Admitting: Family Medicine

## 2019-04-27 DIAGNOSIS — Z0289 Encounter for other administrative examinations: Secondary | ICD-10-CM

## 2019-04-29 ENCOUNTER — Other Ambulatory Visit: Payer: Self-pay | Admitting: Family Medicine

## 2019-04-30 ENCOUNTER — Ambulatory Visit: Payer: Medicare HMO | Admitting: Family Medicine

## 2019-05-02 ENCOUNTER — Encounter: Payer: Self-pay | Admitting: Family Medicine

## 2019-05-11 DIAGNOSIS — E119 Type 2 diabetes mellitus without complications: Secondary | ICD-10-CM | POA: Diagnosis not present

## 2019-05-11 DIAGNOSIS — H524 Presbyopia: Secondary | ICD-10-CM | POA: Diagnosis not present

## 2019-05-11 DIAGNOSIS — Z961 Presence of intraocular lens: Secondary | ICD-10-CM | POA: Diagnosis not present

## 2019-05-18 ENCOUNTER — Encounter: Payer: Self-pay | Admitting: Family Medicine

## 2019-05-22 ENCOUNTER — Ambulatory Visit (INDEPENDENT_AMBULATORY_CARE_PROVIDER_SITE_OTHER): Payer: Medicare HMO | Admitting: Family Medicine

## 2019-05-22 ENCOUNTER — Other Ambulatory Visit: Payer: Self-pay

## 2019-05-22 ENCOUNTER — Encounter: Payer: Self-pay | Admitting: Family Medicine

## 2019-05-22 VITALS — BP 124/62 | HR 48 | Temp 98.4°F | Ht 71.0 in | Wt 249.4 lb

## 2019-05-22 DIAGNOSIS — R001 Bradycardia, unspecified: Secondary | ICD-10-CM | POA: Diagnosis not present

## 2019-05-22 DIAGNOSIS — Z6834 Body mass index (BMI) 34.0-34.9, adult: Secondary | ICD-10-CM

## 2019-05-22 DIAGNOSIS — I152 Hypertension secondary to endocrine disorders: Secondary | ICD-10-CM

## 2019-05-22 DIAGNOSIS — E669 Obesity, unspecified: Secondary | ICD-10-CM

## 2019-05-22 DIAGNOSIS — I10 Essential (primary) hypertension: Secondary | ICD-10-CM

## 2019-05-22 DIAGNOSIS — I5042 Chronic combined systolic (congestive) and diastolic (congestive) heart failure: Secondary | ICD-10-CM

## 2019-05-22 DIAGNOSIS — E1159 Type 2 diabetes mellitus with other circulatory complications: Secondary | ICD-10-CM

## 2019-05-22 LAB — POCT GLYCOSYLATED HEMOGLOBIN (HGB A1C): Hemoglobin A1C: 6.1 % — AB (ref 4.0–5.6)

## 2019-05-22 NOTE — Assessment & Plan Note (Signed)
At goal.  Continue Norvasc 10 mg daily, quinapril 40 mg daily, and metoprolol 25 mg twice daily.

## 2019-05-22 NOTE — Progress Notes (Signed)
   Chief Complaint:  Brent Taylor is a 78 y.o. male who presents today with a chief complaint of T2DM.   Assessment/Plan:  Hypertension associated with diabetes ([place]) At goal.  Continue Norvasc 10 mg daily, quinapril 40 mg daily, and metoprolol 25 mg twice daily.  Type 2 diabetes mellitus with vascular disease (HCC) A1c 6.1.  Has been well controlled for the past couple of years.  Will stop metformin today.  He will continue working on diet and exercise.  Will recheck in 6 months.  Chronic combined systolic and diastolic heart failure (HCC) Stable.  No signs of volume overload.  Continue medications and lifestyle modifications.  Bradycardia No symptoms.  Likely due to beta-blocker.  Will continue current dose for now.  Discussed reasons to return to care.  Body mass index is 34.78 kg/m. / Obese BMI Metric Follow Up - 05/22/19 1037      BMI Metric Follow Up-Please document annually   BMI Metric Follow Up  Education provided         Subjective:  HPI:  His stable, chronic medical conditions are outlined below:  # T2DM -Currently on metformin 500 mg daily and tolerating well. -No polyuria or polydipsia  # Hypertension / HFrEF / CAD -Currently on Norvasc 10 mg daily, quinapril 40 mg daily, metoprolol 25 mg twice daily.  Tolerating all these well without side effects. -On plavix 75mg  daily -No chest pain or shortness of breath   ROS: Per HPI  PMH: He reports that he has never smoked. He has never used smokeless tobacco. He reports that he does not drink alcohol or use drugs.      Objective:  Physical Exam: BP 124/62   Pulse (!) 48   Temp 98.4 F (36.9 C)   Ht 5\' 11"  (1.803 m)   Wt 249 lb 6.1 oz (113.1 kg)   SpO2 96%   BMI 34.78 kg/m   Wt Readings from Last 3 Encounters:  05/22/19 249 lb 6.1 oz (113.1 kg)  10/30/18 249 lb 3.2 oz (113 kg)  09/15/18 245 lb 3.2 oz (111.2 kg)  Gen: NAD, resting comfortably CV: Regular rate and rhythm with no murmurs appreciated  Pulm: Normal work of breathing, clear to auscultation bilaterally with no crackles, wheezes, or rhonchi Neuro: Grossly normal, moves all extremities Psych: Normal affect and thought content  Results for orders placed or performed in visit on 05/22/19 (from the past 24 hour(s))  POCT glycosylated hemoglobin (Hb A1C)     Status: Abnormal   Collection Time: 05/22/19 10:24 AM  Result Value Ref Range   Hemoglobin A1C 6.1 (A) 4.0 - 5.6 %        Gor [place]est[place] M. [name], MD 05/22/2019 10:37 AM

## 2019-05-22 NOTE — Patient Instructions (Signed)
It was very nice to see you today!  Your A1c looks great.  We will stop the metformin.  Come back in 6 months for your next visit with blood work. Come back sooner if needed.   Take care, Dr [name]  Please try these tips to maintain a healthy lifestyle:   Eat at least 3 REAL meals and 1-2 snacks per day.  Aim for no more than 5 hours between eating.  If you eat breakfast, please do so within one hour of getting up.    Obtain twice as many fruits/vegetables as protein or carbohydrate foods for both lunch and dinner. (Half of each meal should be fruits/vegetables, one quarter protein, and one quarter starchy carbs)   Cut down on sweet beverages. This includes juice, soda, and sweet tea.    Exercise at least 150 minutes every week.

## 2019-05-22 NOTE — Assessment & Plan Note (Signed)
Stable.  No signs of volume overload.  Continue medications and lifestyle modifications.

## 2019-05-22 NOTE — Assessment & Plan Note (Signed)
A1c 6.1.  Has been well controlled for the past couple of years.  Will stop metformin today.  He will continue working on diet and exercise.  Will recheck in 6 months.

## 2019-05-31 DIAGNOSIS — R69 Illness, unspecified: Secondary | ICD-10-CM | POA: Diagnosis not present

## 2019-06-06 ENCOUNTER — Encounter: Payer: Self-pay | Admitting: Family Medicine

## 2019-07-03 ENCOUNTER — Other Ambulatory Visit: Payer: Self-pay | Admitting: Family Medicine

## 2019-07-05 ENCOUNTER — Other Ambulatory Visit: Payer: Self-pay | Admitting: Family Medicine

## 2019-07-17 ENCOUNTER — Other Ambulatory Visit: Payer: Self-pay

## 2019-07-17 ENCOUNTER — Ambulatory Visit (INDEPENDENT_AMBULATORY_CARE_PROVIDER_SITE_OTHER): Payer: Medicare HMO

## 2019-07-17 DIAGNOSIS — Z0000 Encounter for general adult medical examination without abnormal findings: Secondary | ICD-10-CM

## 2019-07-17 NOTE — Progress Notes (Signed)
I have personally reviewed the Medicare Annual Wellness Visit and agree with the assessment and plan.  Algis Greenhouse. [name], MD 07/17/2019 3:22 PM

## 2019-07-17 NOTE — Patient Instructions (Signed)
[name] , Thank you for taking time to come for your Medicare Wellness Visit. I appreciate your ongoing commitment to your health goals. Please review the following plan we discussed and let me know if I can assist you in the future.   Screening recommendations/referrals: Colorectal Screening: not indicated; last 2011  Vision and Dental Exams: Recommended annual ophthalmology exams for early detection of glaucoma and other disorders of the eye Recommended annual dental exams for proper oral hygiene  Diabetic Exams: Diabetic Eye Exam: yearly; will request records Diabetic Foot Exam: completed  Vaccinations: Influenza vaccine: completed 05/31/19 Pneumococcal vaccine: up to date; last 03/13/14 Tdap vaccine: up to date; last 03/13/14 Shingles vaccine: Please call your insurance company to determine your out of pocket expense for the Shingrix vaccine. You may receive this vaccine at your local pharmacy.  Advanced directives: Please bring a copy of your POA (Power of Attorney) and/or Living Will to your next appointment.  Goals: Recommend to exercise for at least 150 minutes per week and to eat a heart healthy diet.    your Annual Wellness Visit with your Nurse Health Advisor in one year.  Preventive Care 21 Years and Older, Male Preventive care refers to lifestyle choices and visits with your health care provider that can promote health and wellness. What does preventive care include?  A yearly physical exam. This is also called an annual well check.  Dental exams once or twice a year.  Routine eye exams. Ask your health care provider how often you should have your eyes checked.  Personal lifestyle choices, including:  Daily care of your teeth and gums.  Regular physical activity.  Eating a healthy diet.  Avoiding tobacco and drug use.  Limiting alcohol use.  Practicing safe sex.  Taking low doses of aspirin every day if recommended by your health care provider..  Taking  vitamin and mineral supplements as recommended by your health care provider. What happens during an annual well check? The services and screenings done by your health care provider during your annual well check will depend on your age, overall health, lifestyle risk factors, and family history of disease. Counseling  Your health care provider may ask you questions about your:  Alcohol use.  Tobacco use.  Drug use.  Emotional well-being.  Home and relationship well-being.  Sexual activity.  Eating habits.  History of falls.  Memory and ability to understand (cognition).  Work and work Statistician. Screening  You may have the following tests or measurements:  Height, weight, and BMI.  Blood pressure.  Lipid and cholesterol levels. These may be checked every 5 years, or more frequently if you are over 101 years old.  Skin check.  Lung cancer screening. You may have this screening every year starting at age 2 if you have a 30-pack-year history of smoking and currently smoke or have quit within the past 15 years.  Fecal occult blood test (FOBT) of the stool. You may have this test every year starting at age 54.  Flexible sigmoidoscopy or colonoscopy. You may have a sigmoidoscopy every 5 years or a colonoscopy every 10 years starting at age 45.  Prostate cancer screening. Recommendations will vary depending on your family history and other risks.  Hepatitis C blood test.  Hepatitis B blood test.  Sexually transmitted disease (STD) testing.  Diabetes screening. This is done by checking your blood sugar (glucose) after you have not eaten for a while (fasting). You may have this done every 1-3 years.  Abdominal aortic aneurysm (AAA) screening. You may need this if you are a current or former smoker.  Osteoporosis. You may be screened starting at age 13 if you are at high risk. Talk with your health care provider about your test results, treatment options, and if  necessary, the need for more tests. Vaccines  Your health care provider may recommend certain vaccines, such as:  Influenza vaccine. This is recommended every year.  Tetanus, diphtheria, and acellular pertussis (Tdap, Td) vaccine. You may need a Td booster every 10 years.  Zoster vaccine. You may need this after age 30.  Pneumococcal 13-valent conjugate (PCV13) vaccine. One dose is recommended after age 44.  Pneumococcal polysaccharide (PPSV23) vaccine. One dose is recommended after age 69. Talk to your health care provider about which screenings and vaccines you need and how often you need them. This information is not intended to replace advice given to you by your health care provider. Make sure you discuss any questions you have with your health care provider. Document Released: 10/24/2015 Document Revised: 06/16/2016 Document Reviewed: 07/29/2015 Elsevier Interactive Patient Education  2017 [employer].

## 2019-07-17 NOTE — Progress Notes (Signed)
This visit is being conducted via phone call due to the COVID-19 pandemic. This patient has given me verbal consent via phone to conduct this visit, patient states they are participating from their home address. Some vital signs may be absent or patient reported.   Patient identification: identified by name, DOB, and current address.   Subjective:   Brent Taylor is a 78 y.o. male who presents for Medicare Annual/Subsequent preventive examination.  Review of Systems:   Cardiac Risk Factors include: advanced age (>8mn, >>34women);male gender;hypertension;diabetes mellitus     Objective:    Vitals: There were no vitals taken for this visit.  There is no height or weight on file to calculate BMI.  Advanced Directives 07/17/2019 09/14/2016 04/25/2016 12/28/2015 11/10/2015 11/09/2015  Does Patient Have a Medical Advance Directive? Yes Yes No No No No  Type of Advance Directive Living will;Healthcare Power of Attorney - - - - -  Does patient want to make changes to medical advance directive? No - Patient declined - - - - -  Copy of [place] Chart? No - copy requested - - - - -  Would patient like information on creating a medical advance directive? - - - No - patient declined information No - patient declined information -    Tobacco Social History   Tobacco Use  Smoking Status Never Smoker  Smokeless Tobacco Never Used     Counseling given: Not Answered   Clinical Intake:  Pre-visit preparation completed: Yes  Pain : No/denies pain  Diabetes: Yes CBG done?: No Did pt. bring in CBG monitor from home?: No  How often do you need to have someone help you when you read instructions, pamphlets, or other written materials from your doctor or pharmacy?: 1 - Never  Interpreter Needed?: No  Information entered by :: CDenman GeorgeLPN  Past Medical History:  Diagnosis Date  . Cancer (HCC)    hx - melanoma  . Chicken pox   . Colon polyps   . Depression     No need for therapy.  Improved  . Diverticula, colon   . Hemorrhoids   . Hyperlipidemia   . Hypertension   . [place]   . Tinnitus   . UTI (lower urinary tract infection)    Past Surgical History:  Procedure Laterality Date  . MELANOMA EXCISION     LEFT CHEST  . MELANOMA EXCISION     Family History  Problem Relation Age of Onset  . Heart Problems Mother        Skipping   Social History   Socioeconomic History  . Marital status: Married    Spouse name: Not on file  . Number of children: 2  . Years of education: Not on file  . Highest education level: Not on file  Occupational History  . Occupation: MCompany secretary Social Needs  . Financial resource strain: Not on file  . Food insecurity    Worry: Not on file    Inability: Not on file  . Transportation needs    Medical: Not on file    Non-medical: Not on file  Tobacco Use  . Smoking status: Never Smoker  . Smokeless tobacco: Never Used  Substance and Sexual Activity  . Alcohol use: No  . Drug use: No  . Sexual activity: Not on file  Lifestyle  . Physical activity    Days per week: Not on file    Minutes per session: Not on file  .  Stress: Not on file  Relationships  . Social Herbalist on phone: Not on file    Gets together: Not on file    Attends religious service: Not on file    Active member of club or organization: Not on file    Attends meetings of clubs or organizations: Not on file    Relationship status: Not on file  Other Topics Concern  . Not on file  Social History Narrative   Lives with wife.      Outpatient Encounter Medications as of 07/17/2019  Medication Sig  . amLODipine (NORVASC) 10 MG tablet Take 1 tablet (10 mg total) by mouth daily.  . Blood Glucose Monitoring Suppl (ONE TOUCH ULTRA 2) w/Device KIT Use to check blood sugar 3-4 times daily  . clopidogrel (PLAVIX) 75 MG tablet TAKE ONE TABLET BY MOUTH DAILY  . FLUoxetine (PROZAC) 20 MG tablet TAKE ONE TABLET BY MOUTH DAILY   . glucose blood (ONE TOUCH ULTRA TEST) test strip Use to check blood sugar 3-4 times daily  . ibuprofen (ADVIL,MOTRIN) 200 MG tablet Take 600 mg by mouth every 6 (six) hours as needed for moderate pain.   . metoprolol tartrate (LOPRESSOR) 25 MG tablet TAKE ONE TABLET BY MOUTH TWICE A DAY  . OneTouch Delica Lancets 28B MISC Use to check blood sugar 3-4 times a day  . pantoprazole (PROTONIX) 20 MG tablet TAKE ONE TABLET BY MOUTH DAILY  . pravastatin (PRAVACHOL) 20 MG tablet Take 1 tablet (20 mg total) by mouth daily.  . quinapril (ACCUPRIL) 40 MG tablet TAKE ONE TABLET BY MOUTH DAILY   No facility-administered encounter medications on file as of 07/17/2019.     Activities of Daily Living In your present state of health, do you have any difficulty performing the following activities: 07/17/2019  Hearing? N  Vision? N  Difficulty concentrating or making decisions? N  Walking or climbing stairs? N  Dressing or bathing? N  Doing errands, shopping? N  Preparing Food and eating ? N  Using the Toilet? N  In the past six months, have you accidently leaked urine? N  Do you have problems with loss of bowel control? N  Managing your Medications? N  Managing your Finances? N  Housekeeping or managing your Housekeeping? N  Some recent data might be hidden    Patient Care Team: [name], MD as PCP - General (Family Medicine)   Assessment:   This is a routine wellness examination for Brent Taylor.  Exercise Activities and Dietary recommendations Current Exercise Habits: The patient does not participate in regular exercise at present  Goals    . Exercise 150 minutes per week (moderate activity)     Keep active. May try to water        Fall Risk Fall Risk  07/17/2019 05/22/2019 07/11/2018 10/20/2016 09/14/2016  Falls in the past year? 0 0 No No No  Comment - - - - -  Number falls in past yr: - - - - -  Injury with Fall? 0 - - - -  Risk for fall due to : - - - - -  Follow up Falls  evaluation completed;Education provided;Falls prevention discussed - - - -   Is the patient's home free of loose throw rugs in walkways, pet beds, electrical cords, etc?   yes      Grab bars in the bathroom? yes      Handrails on the stairs?   yes  Adequate lighting?   yes  Depression Screen PHQ 2/9 Scores 07/17/2019 05/22/2019 10/30/2018 10/30/2018  PHQ - 2 Score 0 0 0 0  PHQ- 9 Score - 1 2 -    Cognitive Function- no cognitive concerns at this time   6CIT Screen 07/17/2019 09/14/2016  What Year? 0 points 0 points  What month? 0 points 0 points  What time? 0 points 0 points  Count back from 20 0 points 0 points  Months in reverse 0 points 0 points  Repeat phrase 0 points 0 points  Total Score 0 0    Immunization History  Administered Date(s) Administered  . Influenza Split 07/14/2012  . Influenza, High Dose Seasonal PF 07/28/2015, 09/14/2016, 10/07/2017, 07/11/2018, 05/31/2019  . Influenza,inj,Quad PF,6+ Mos 07/16/2013, 07/25/2014  . Pneumococcal Conjugate-13 03/13/2014  . Pneumococcal Polysaccharide-23 07/16/2013  . Tetanus 03/13/2014    Qualifies for Shingles Vaccine? Discussed and patient will check with pharmacy for coverage.  Patient education handout provided   Screening Tests Health Maintenance  Topic Date Due  . OPHTHALMOLOGY EXAM  05/09/2019  . FOOT EXAM  10/31/2019  . HEMOGLOBIN A1C  11/22/2019  . TETANUS/TDAP  03/13/2024  . INFLUENZA VACCINE  Completed  . PNA vac Low Risk Adult  Completed   Cancer Screenings: Lung: Low Dose CT Chest recommended if Age 2-80 years, 30 pack-year currently smoking OR have quit w/in 15years. Patient does not qualify. Colorectal: not indicated; last 2011   Plan:  I have personally reviewed and addressed the Medicare Annual Wellness questionnaire and have noted the following in the patient's chart:  A. Medical and social history B. Use of alcohol, tobacco or illicit drugs  C. Current medications and supplements D.  Functional ability and status E.  Nutritional status F.  Physical activity G. Advance directives H. List of other physicians I.  Hospitalizations, surgeries, and ER visits in previous 12 months J.  [place] such as hearing and vision if needed, cognitive and depression L. Referrals, records requested, and appointments- none   In addition, I have reviewed and discussed with patient certain preventive protocols, quality metrics, and best practice recommendations. A written personalized care plan for preventive services as well as general preventive health recommendations were provided to patient.   Signed,  Denman George, LPN  Nurse Health Advisor   Nurse Notes: no additional

## 2019-07-26 ENCOUNTER — Other Ambulatory Visit: Payer: Self-pay | Admitting: Family Medicine

## 2019-08-15 DIAGNOSIS — M545 Low back pain: Secondary | ICD-10-CM | POA: Diagnosis not present

## 2019-08-15 DIAGNOSIS — I700 Atherosclerosis of aorta: Secondary | ICD-10-CM | POA: Diagnosis not present

## 2019-08-21 ENCOUNTER — Encounter: Payer: Self-pay | Admitting: Family Medicine

## 2019-08-27 DIAGNOSIS — M545 Low back pain: Secondary | ICD-10-CM | POA: Diagnosis not present

## 2019-08-29 DIAGNOSIS — M48062 Spinal stenosis, lumbar region with neurogenic claudication: Secondary | ICD-10-CM | POA: Diagnosis not present

## 2019-08-29 DIAGNOSIS — M48061 Spinal stenosis, lumbar region without neurogenic claudication: Secondary | ICD-10-CM | POA: Insufficient documentation

## 2019-08-30 DIAGNOSIS — M545 Low back pain: Secondary | ICD-10-CM | POA: Diagnosis not present

## 2019-08-30 DIAGNOSIS — M48062 Spinal stenosis, lumbar region with neurogenic claudication: Secondary | ICD-10-CM | POA: Diagnosis not present

## 2019-09-10 ENCOUNTER — Encounter: Payer: Self-pay | Admitting: Family Medicine

## 2019-09-10 ENCOUNTER — Telehealth: Payer: Self-pay | Admitting: Family Medicine

## 2019-09-10 NOTE — Telephone Encounter (Signed)
Pt stated he received a call informing about refill he needs but he doenst know which meds are needed or which meds the message was speaking of/ please advise

## 2019-09-10 NOTE — Telephone Encounter (Signed)
See note

## 2019-09-12 ENCOUNTER — Other Ambulatory Visit: Payer: Self-pay | Admitting: Family Medicine

## 2019-09-27 DIAGNOSIS — M48062 Spinal stenosis, lumbar region with neurogenic claudication: Secondary | ICD-10-CM | POA: Diagnosis not present

## 2019-09-30 ENCOUNTER — Other Ambulatory Visit: Payer: Self-pay | Admitting: Family Medicine

## 2019-10-01 ENCOUNTER — Encounter: Payer: Self-pay | Admitting: Family Medicine

## 2019-10-14 DIAGNOSIS — R69 Illness, unspecified: Secondary | ICD-10-CM | POA: Diagnosis not present

## 2019-10-15 DIAGNOSIS — R69 Illness, unspecified: Secondary | ICD-10-CM | POA: Diagnosis not present

## 2019-10-27 ENCOUNTER — Encounter: Payer: Self-pay | Admitting: Family Medicine

## 2019-10-30 DIAGNOSIS — R69 Illness, unspecified: Secondary | ICD-10-CM | POA: Diagnosis not present

## 2019-11-02 ENCOUNTER — Other Ambulatory Visit: Payer: Self-pay

## 2019-11-05 ENCOUNTER — Encounter: Payer: Self-pay | Admitting: Family Medicine

## 2019-11-05 ENCOUNTER — Other Ambulatory Visit: Payer: Self-pay

## 2019-11-05 ENCOUNTER — Ambulatory Visit (INDEPENDENT_AMBULATORY_CARE_PROVIDER_SITE_OTHER): Payer: Medicare HMO | Admitting: Family Medicine

## 2019-11-05 VITALS — BP 124/60 | HR 60 | Temp 97.3°F | Ht 71.0 in | Wt 245.5 lb

## 2019-11-05 DIAGNOSIS — Z0001 Encounter for general adult medical examination with abnormal findings: Secondary | ICD-10-CM

## 2019-11-05 DIAGNOSIS — I10 Essential (primary) hypertension: Secondary | ICD-10-CM

## 2019-11-05 DIAGNOSIS — E1159 Type 2 diabetes mellitus with other circulatory complications: Secondary | ICD-10-CM

## 2019-11-05 DIAGNOSIS — K219 Gastro-esophageal reflux disease without esophagitis: Secondary | ICD-10-CM | POA: Diagnosis not present

## 2019-11-05 DIAGNOSIS — E1169 Type 2 diabetes mellitus with other specified complication: Secondary | ICD-10-CM | POA: Diagnosis not present

## 2019-11-05 DIAGNOSIS — F325 Major depressive disorder, single episode, in full remission: Secondary | ICD-10-CM

## 2019-11-05 DIAGNOSIS — R69 Illness, unspecified: Secondary | ICD-10-CM | POA: Diagnosis not present

## 2019-11-05 DIAGNOSIS — E785 Hyperlipidemia, unspecified: Secondary | ICD-10-CM

## 2019-11-05 DIAGNOSIS — I152 Hypertension secondary to endocrine disorders: Secondary | ICD-10-CM

## 2019-11-05 LAB — COMPREHENSIVE METABOLIC PANEL
ALT: 29 U/L (ref 0–53)
AST: 29 U/L (ref 0–37)
Albumin: 4.1 g/dL (ref 3.5–5.2)
Alkaline Phosphatase: 52 U/L (ref 39–117)
BUN: 18 mg/dL (ref 6–23)
CO2: 25 mEq/L (ref 19–32)
Calcium: 8.9 mg/dL (ref 8.4–10.5)
Chloride: 107 mEq/L (ref 96–112)
Creatinine, Ser: 1.19 mg/dL (ref 0.40–1.50)
GFR: 58.68 mL/min — ABNORMAL LOW (ref >60.00)
Glucose, Bld: 118 mg/dL — ABNORMAL HIGH (ref 70–99)
Potassium: 4 mEq/L (ref 3.5–5.1)
Sodium: 141 mEq/L (ref 135–145)
Total Bilirubin: 0.7 mg/dL (ref 0.2–1.2)
Total Protein: 6.3 g/dL (ref 6.0–8.3)

## 2019-11-05 LAB — HEMOGLOBIN A1C: Hgb A1c MFr Bld: 6.4 % (ref 4.6–6.5)

## 2019-11-05 LAB — LIPID PANEL
Cholesterol: 162 mg/dL (ref 0–200)
HDL: 34.8 mg/dL — ABNORMAL LOW (ref >39.00)
LDL Cholesterol: 87 mg/dL (ref 0–99)
NonHDL: 126.85
Total CHOL/HDL Ratio: 5
Triglycerides: 197 mg/dL — ABNORMAL HIGH (ref 0.0–149.0)
VLDL: 39.4 mg/dL (ref 0.0–40.0)

## 2019-11-05 LAB — CBC
HCT: 48.5 % (ref 39.0–52.0)
Hemoglobin: 15.8 g/dL (ref 13.0–17.0)
MCHC: 32.6 g/dL (ref 30.0–36.0)
MCV: 90.3 fl (ref 78.0–100.0)
Platelets: 177 10*3/uL (ref 150.0–400.0)
RBC: 5.37 Mil/uL (ref 4.22–5.81)
RDW: 15.2 % (ref 11.5–15.5)
WBC: 5.2 10*3/uL (ref 4.0–10.5)

## 2019-11-05 LAB — TSH: TSH: 2.52 u[IU]/mL (ref 0.35–4.50)

## 2019-11-05 NOTE — Assessment & Plan Note (Signed)
Stable.  Continue Prozac 20 mg daily. 

## 2019-11-05 NOTE — Assessment & Plan Note (Signed)
At goal.  Continue amlodipine 10 mg daily, metoprolol 25 mg twice daily, and quinapril 40 mg daily.

## 2019-11-05 NOTE — Assessment & Plan Note (Signed)
Check lipid panel.  Continue pravastatin 20 mg daily. 

## 2019-11-05 NOTE — Assessment & Plan Note (Signed)
Stable.  Continue Protonix 20 mg daily.

## 2019-11-05 NOTE — Progress Notes (Signed)
Chief Complaint:  Brent Taylor is a 79 y.o. male who presents today for his annual comprehensive physical exam.    Assessment/Plan:  New/Acute Problems: Neck Strain No red flags.  Likely mild cutaneous nerve entrapment due to recent neck strain.  Discussed home exercises and handout was given.  Also recommended heating pad.  Chronic Problems Addressed Today: GERD (gastroesophageal reflux disease) Stable.  Continue Protonix 20 mg daily.  Major depression in remission ([place]) Stable.  Continue Prozac 20 mg daily.  Dyslipidemia associated with type 2 diabetes mellitus (HCC) Check lipid panel.  Continue pravastatin 20 mg daily.  Type 2 diabetes mellitus with vascular disease (HCC) Check A1c.  Hypertension associated with diabetes ([place]) At goal.  Continue amlodipine 10 mg daily, metoprolol 25 mg twice daily, and quinapril 40 mg daily.  Preventative Healthcare: Discussed Covid vaccine.  He is currently on the waiting list.  Will check CBC, C met, TSH, lipid panel, A1c.  Patient Counseling(The following topics were reviewed and/or handout was given):  -Nutrition: Stressed importance of moderation in sodium/caffeine intake, saturated fat and cholesterol, caloric balance, sufficient intake of fresh fruits, vegetables, and fiber.  -Stressed the importance of regular exercise.   -Substance Abuse: Discussed cessation/primary prevention of tobacco, alcohol, or other drug use; driving or other dangerous activities under the influence; availability of treatment for abuse.   -Injury prevention: Discussed safety belts, safety helmets, smoke detector, smoking near bedding or upholstery.   -Sexuality: Discussed sexually transmitted diseases, partner selection, use of condoms, avoidance of unintended pregnancy and contraceptive alternatives.   -Dental health: Discussed importance of regular tooth brushing, flossing, and dental visits.  -Health maintenance and immunizations reviewed. Please refer to  Health maintenance section.  Return to care in 1 year for next preventative visit.     Subjective:  HPI:  He has no acute complaints today.   However last several weeks has noticed some tingling in the left side of his neck.  Had a "crick" in his neck that resolved with chiropractor.  Happens occasionally.  No other weakness or numbness.  He is otherwise doing well does not have any issues with any of his current medications.  Lifestyle Diet: None specific.  Exercise: Limited due to covid.   Depression screen PHQ 2/9 07/17/2019  Decreased Interest 0  Down, Depressed, Hopeless 0  PHQ - 2 Score 0  Altered sleeping -  Tired, decreased energy -  Change in appetite -  Feeling bad or failure about yourself  -  Trouble concentrating -  Moving slowly or fidgety/restless -  Suicidal thoughts -  PHQ-9 Score -  Difficult doing work/chores -    Health Maintenance Due  Topic Date Due  . FOOT EXAM  10/31/2019     ROS: Per HPI, otherwise a complete review of systems was negative.   PMH:  The following were reviewed and entered/updated in epic: Past Medical History:  Diagnosis Date  . Cancer (HCC)    hx - melanoma  . Chicken pox   . Colon polyps   . Depression    No need for therapy.  Improved  . Diverticula, colon   . Hemorrhoids   . Hyperlipidemia   . Hypertension   . Sepsis ([place])   . Tinnitus   . UTI (lower urinary tract infection)    Patient Active Problem List   Diagnosis Date Noted  . Type 2 diabetes mellitus with vascular disease ([place]) 10/20/2016  . Hypertension associated with diabetes ([place]) 11/20/2015  . Chronic combined systolic  and diastolic heart failure ([place])   . Coronary artery disease 12/10/2011  . Dyslipidemia associated with type 2 diabetes mellitus ([place]) 12/10/2011  . Major depression in remission ([place]) 12/10/2011  . GERD (gastroesophageal reflux disease) 12/10/2011  . Hx of colonic polyps 12/10/2011  . Melanoma ([place]) 12/10/2011   Past Surgical  History:  Procedure Laterality Date  . MELANOMA EXCISION     LEFT CHEST  . MELANOMA EXCISION      Family History  Problem Relation Age of Onset  . Heart Problems Mother        Skipping    Medications- reviewed and updated Current Outpatient Medications  Medication Sig Dispense Refill  . amLODipine (NORVASC) 10 MG tablet Take 1 tablet (10 mg total) by mouth daily. 90 tablet 2  . Blood Glucose Monitoring Suppl (ONE TOUCH ULTRA 2) w/Device KIT Use to check blood sugar 3-4 times daily 1 each 0  . clopidogrel (PLAVIX) 75 MG tablet TAKE ONE TABLET BY MOUTH DAILY 90 tablet 2  . FLUoxetine (PROZAC) 20 MG tablet TAKE ONE TABLET BY MOUTH DAILY 90 tablet 2  . glucose blood (ONE TOUCH ULTRA TEST) test strip Use to check blood sugar 3-4 times daily 100 each 12  . ibuprofen (ADVIL,MOTRIN) 200 MG tablet Take 600 mg by mouth every 6 (six) hours as needed for moderate pain.     . metoprolol tartrate (LOPRESSOR) 25 MG tablet TAKE ONE TABLET BY MOUTH TWICE A DAY 180 tablet 2  . OneTouch Delica Lancets 81X MISC Use to check blood sugar 3-4 times a day 100 each 11  . pantoprazole (PROTONIX) 20 MG tablet TAKE ONE TABLET BY MOUTH DAILY 90 tablet 0  . pravastatin (PRAVACHOL) 20 MG tablet TAKE ONE TABLET BY MOUTH DAILY 90 tablet 1  . quinapril (ACCUPRIL) 40 MG tablet TAKE ONE TABLET BY MOUTH DAILY 90 tablet 0   No current facility-administered medications for this visit.    Allergies-reviewed and updated Allergies  Allergen Reactions  . Lipitor [Atorvastatin Calcium]     Muscle ache    Social History   Socioeconomic History  . Marital status: Married    Spouse name: Not on file  . Number of children: 2  . Years of education: Not on file  . Highest education level: Not on file  Occupational History  . Occupation: Company secretary  Tobacco Use  . Smoking status: Never Smoker  . Smokeless tobacco: Never Used  Substance and Sexual Activity  . Alcohol use: No  . Drug use: No  . Sexual activity: Not  on file  Other Topics Concern  . Not on file  Social History Narrative   Lives with wife.     Social Determinants of Health   Financial Resource Strain:   . Difficulty of Paying Living Expenses: Not on file  Food Insecurity:   . Worried About Charity fundraiser in the Last Year: Not on file  . Ran Out of Food in the Last Year: Not on file  Transportation Needs:   . Lack of Transportation (Medical): Not on file  . Lack of Transportation (Non-Medical): Not on file  Physical Activity:   . Days of Exercise per Week: Not on file  . Minutes of Exercise per Session: Not on file  Stress:   . Feeling of Stress : Not on file  Social Connections:   . Frequency of Communication with Friends and Family: Not on file  . Frequency of Social Gatherings with Friends and Family: Not on file  .  Attends Religious Services: Not on file  . Active Member of Clubs or Organizations: Not on file  . Attends Archivist Meetings: Not on file  . Marital Status: Not on file        Objective:  Physical Exam: BP 124/60   Pulse 60   Temp (!) 97.3 F (36.3 C)   Ht '5\' 11"'$  (1.803 m)   Wt 245 lb 8 oz (111.4 kg)   SpO2 96%   BMI 34.24 kg/m   Body mass index is 34.24 kg/m. Wt Readings from Last 3 Encounters:  11/05/19 245 lb 8 oz (111.4 kg)  05/22/19 249 lb 6.1 oz (113.1 kg)  10/30/18 249 lb 3.2 oz (113 kg)   Gen: NAD, resting comfortably HEENT: TMs normal bilaterally. OP clear. No thyromegaly noted.  CV: RRR with no murmurs appreciated Pulm: NWOB, CTAB with no crackles, wheezes, or rhonchi GI: Normal bowel sounds present. Soft, Nontender, Nondistended. MSK: no edema, cyanosis, or clubbing noted - Neck: No deformities.  Nontender to palpation.  Tinel's sign negative.  Spurling negative. Skin: warm, dry Neuro: CN2-12 grossly intact. Strength 5/5 in upper and lower extremities. Reflexes symmetric and intact bilaterally.  Psych: Normal affect and thought content     Ayame Rena M. [name],  MD 11/05/2019 9:52 AM

## 2019-11-05 NOTE — Patient Instructions (Signed)
It was very nice to see you today!  Keep up the good work!  We will check blood work today.  Come back in 1 year for your next physical with blood work, or sooner if needed.  Take care, Dr [name]  Please try these tips to maintain a healthy lifestyle:   Eat at least 3 REAL meals and 1-2 snacks per day.  Aim for no more than 5 hours between eating.  If you eat breakfast, please do so within one hour of getting up.    Each meal should contain half fruits/vegetables, one quarter protein, and one quarter carbs (no bigger than a computer mouse)   Cut down on sweet beverages. This includes juice, soda, and sweet tea.     Drink at least 1 glass of water with each meal and aim for at least 8 glasses per day   Exercise at least 150 minutes every week.    Preventive Care 21 Years and Older, Male Preventive care refers to lifestyle choices and visits with your health care provider that can promote health and wellness. This includes:  A yearly physical exam. This is also called an annual well check.  Regular dental and eye exams.  Immunizations.  Screening for certain conditions.  Healthy lifestyle choices, such as diet and exercise. What can I expect for my preventive care visit? Physical exam Your health care provider will check:  Height and weight. These may be used to calculate body mass index (BMI), which is a measurement that tells if you are at a healthy weight.  Heart rate and blood pressure.  Your skin for abnormal spots. Counseling Your health care provider may ask you questions about:  Alcohol, tobacco, and drug use.  Emotional well-being.  Home and relationship well-being.  Sexual activity.  Eating habits.  History of falls.  Memory and ability to understand (cognition).  Work and work Statistician. What immunizations do I need?  Influenza (flu) vaccine  This is recommended every year. Tetanus, diphtheria, and pertussis (Tdap) vaccine  You  may need a Td booster every 10 years. Varicella (chickenpox) vaccine  You may need this vaccine if you have not already been vaccinated. Zoster (shingles) vaccine  You may need this after age 77. Pneumococcal conjugate (PCV13) vaccine  One dose is recommended after age 9. Pneumococcal polysaccharide (PPSV23) vaccine  One dose is recommended after age 28. Measles, mumps, and rubella (MMR) vaccine  You may need at least one dose of MMR if you were born in 1957 or later. You may also need a second dose. Meningococcal conjugate (MenACWY) vaccine  You may need this if you have certain conditions. Hepatitis A vaccine  You may need this if you have certain conditions or if you travel or work in places where you may be exposed to hepatitis A. Hepatitis B vaccine  You may need this if you have certain conditions or if you travel or work in places where you may be exposed to hepatitis B. Haemophilus influenzae type b (Hib) vaccine  You may need this if you have certain conditions. You may receive vaccines as individual doses or as more than one vaccine together in one shot (combination vaccines). Talk with your health care provider about the risks and benefits of combination vaccines. What tests do I need? Blood tests  Lipid and cholesterol levels. These may be checked every 5 years, or more frequently depending on your overall health.  Hepatitis C test.  Hepatitis B test. Screening  Lung cancer  screening. You may have this screening every year starting at age 41 if you have a 30-pack-year history of smoking and currently smoke or have quit within the past 15 years.  Colorectal cancer screening. All adults should have this screening starting at age 16 and continuing until age 55. Your health care provider may recommend screening at age 1 if you are at increased risk. You will have tests every 1-10 years, depending on your results and the type of screening test.  Prostate cancer  screening. Recommendations will vary depending on your family history and other risks.  Diabetes screening. This is done by checking your blood sugar (glucose) after you have not eaten for a while (fasting). You may have this done every 1-3 years.  Abdominal aortic aneurysm (AAA) screening. You may need this if you are a current or former smoker.  Sexually transmitted disease (STD) testing. Follow these instructions at home: Eating and drinking  Eat a diet that includes fresh fruits and vegetables, whole grains, lean protein, and low-fat dairy products. Limit your intake of foods with high amounts of sugar, saturated fats, and salt.  Take vitamin and mineral supplements as recommended by your health care provider.  Do not drink alcohol if your health care provider tells you not to drink.  If you drink alcohol: ? Limit how much you have to 0-2 drinks a day. ? Be aware of how much alcohol is in your drink. In the [place] one drink equals one 12 oz bottle of beer (355 mL), one 5 oz glass of wine (148 mL), or one 1 oz glass of hard liquor (44 mL). Lifestyle  Take daily care of your teeth and gums.  Stay active. Exercise for at least 30 minutes on 5 or more days each week.  Do not use any products that contain nicotine or tobacco, such as cigarettes, e-cigarettes, and chewing tobacco. If you need help quitting, ask your health care provider.  If you are sexually active, practice safe sex. Use a condom or other form of protection to prevent STIs (sexually transmitted infections).  Talk with your health care provider about taking a low-dose aspirin or statin. What's next?  Visit your health care provider once a year for a well check visit.  Ask your health care provider how often you should have your eyes and teeth checked.  Stay up to date on all vaccines. This information is not intended to replace advice given to you by your health care provider. Make sure you discuss any questions  you have with your health care provider. Document Revised: 09/21/2018 Document Reviewed: 09/21/2018 Elsevier Patient Education  2020 [employer].

## 2019-11-05 NOTE — Assessment & Plan Note (Signed)
Check A1c. 

## 2019-11-06 NOTE — Progress Notes (Signed)
Please inform patient of the following:  Blood work is all stable. Would like for him to keep up the good work and we can recheck in a year.   Algis Greenhouse. [name], MD 11/06/2019 3:55 PM

## 2019-11-14 ENCOUNTER — Other Ambulatory Visit: Payer: Self-pay | Admitting: Family Medicine

## 2019-11-16 DIAGNOSIS — D225 Melanocytic nevi of trunk: Secondary | ICD-10-CM | POA: Diagnosis not present

## 2019-11-16 DIAGNOSIS — L400 Psoriasis vulgaris: Secondary | ICD-10-CM | POA: Diagnosis not present

## 2019-11-16 DIAGNOSIS — L570 Actinic keratosis: Secondary | ICD-10-CM | POA: Diagnosis not present

## 2019-11-16 DIAGNOSIS — L821 Other seborrheic keratosis: Secondary | ICD-10-CM | POA: Diagnosis not present

## 2019-11-16 DIAGNOSIS — Z85820 Personal history of malignant melanoma of skin: Secondary | ICD-10-CM | POA: Diagnosis not present

## 2019-11-16 DIAGNOSIS — D692 Other nonthrombocytopenic purpura: Secondary | ICD-10-CM | POA: Diagnosis not present

## 2019-11-16 DIAGNOSIS — Z85828 Personal history of other malignant neoplasm of skin: Secondary | ICD-10-CM | POA: Diagnosis not present

## 2019-11-16 DIAGNOSIS — L814 Other melanin hyperpigmentation: Secondary | ICD-10-CM | POA: Diagnosis not present

## 2019-11-16 DIAGNOSIS — D224 Melanocytic nevi of scalp and neck: Secondary | ICD-10-CM | POA: Diagnosis not present

## 2019-11-16 DIAGNOSIS — D1801 Hemangioma of skin and subcutaneous tissue: Secondary | ICD-10-CM | POA: Diagnosis not present

## 2019-11-19 ENCOUNTER — Ambulatory Visit: Payer: Medicare HMO | Attending: Internal Medicine

## 2019-11-19 DIAGNOSIS — Z23 Encounter for immunization: Secondary | ICD-10-CM | POA: Insufficient documentation

## 2019-11-19 NOTE — Progress Notes (Signed)
   Covid-19 Vaccination Clinic  Name:  Brent Taylor    MRN: [id number] DOB: 01-Jul-1941  11/19/2019  Brent Taylor was observed post Covid-19 immunization for 15 minutes without incidence. He was provided with Vaccine Information Sheet and instruction to access the V-Safe system.   Brent Taylor was instructed to call 911 with any severe reactions post vaccine: [name] Difficulty breathing  . Swelling of your face and throat  . A fast heartbeat  . A bad rash all over your body  . Dizziness and weakness    Immunizations Administered    Name Date Dose VIS Date Route   Pfizer COVID-19 Vaccine 11/19/2019  2:51 PM 0.3 mL 09/21/2019 Intramuscular   Manufacturer: [place]   Lot: [id number]   [place]: [id number]

## 2019-12-06 DIAGNOSIS — M545 Low back pain: Secondary | ICD-10-CM | POA: Diagnosis not present

## 2019-12-06 DIAGNOSIS — M48062 Spinal stenosis, lumbar region with neurogenic claudication: Secondary | ICD-10-CM | POA: Diagnosis not present

## 2019-12-08 ENCOUNTER — Other Ambulatory Visit: Payer: Self-pay | Admitting: Family Medicine

## 2019-12-11 ENCOUNTER — Other Ambulatory Visit: Payer: Self-pay | Admitting: Family Medicine

## 2019-12-12 ENCOUNTER — Other Ambulatory Visit: Payer: Self-pay | Admitting: Family Medicine

## 2019-12-14 ENCOUNTER — Ambulatory Visit: Payer: Medicare HMO | Attending: Internal Medicine

## 2019-12-14 ENCOUNTER — Other Ambulatory Visit: Payer: Self-pay | Admitting: Family Medicine

## 2019-12-14 DIAGNOSIS — Z23 Encounter for immunization: Secondary | ICD-10-CM

## 2019-12-27 ENCOUNTER — Other Ambulatory Visit: Payer: Self-pay | Admitting: Family Medicine

## 2020-01-03 DIAGNOSIS — M48062 Spinal stenosis, lumbar region with neurogenic claudication: Secondary | ICD-10-CM | POA: Diagnosis not present

## 2020-01-31 DIAGNOSIS — M48062 Spinal stenosis, lumbar region with neurogenic claudication: Secondary | ICD-10-CM | POA: Diagnosis not present

## 2020-01-31 DIAGNOSIS — M545 Low back pain: Secondary | ICD-10-CM | POA: Diagnosis not present

## 2020-01-31 DIAGNOSIS — M47896 Other spondylosis, lumbar region: Secondary | ICD-10-CM | POA: Diagnosis not present

## 2020-01-31 DIAGNOSIS — M47816 Spondylosis without myelopathy or radiculopathy, lumbar region: Secondary | ICD-10-CM | POA: Diagnosis not present

## 2020-02-12 DIAGNOSIS — M47816 Spondylosis without myelopathy or radiculopathy, lumbar region: Secondary | ICD-10-CM | POA: Diagnosis not present

## 2020-02-12 DIAGNOSIS — M47896 Other spondylosis, lumbar region: Secondary | ICD-10-CM | POA: Diagnosis not present

## 2020-02-16 DIAGNOSIS — R52 Pain, unspecified: Secondary | ICD-10-CM | POA: Diagnosis not present

## 2020-02-16 DIAGNOSIS — I499 Cardiac arrhythmia, unspecified: Secondary | ICD-10-CM | POA: Diagnosis not present

## 2020-02-16 DIAGNOSIS — R1084 Generalized abdominal pain: Secondary | ICD-10-CM | POA: Diagnosis not present

## 2020-02-17 ENCOUNTER — Encounter: Payer: Self-pay | Admitting: Family Medicine

## 2020-02-19 ENCOUNTER — Other Ambulatory Visit: Payer: Self-pay

## 2020-02-19 ENCOUNTER — Encounter: Payer: Self-pay | Admitting: Family Medicine

## 2020-02-19 ENCOUNTER — Ambulatory Visit (INDEPENDENT_AMBULATORY_CARE_PROVIDER_SITE_OTHER): Payer: Medicare HMO | Admitting: Family Medicine

## 2020-02-19 VITALS — BP 111/74 | HR 59 | Temp 97.5°F | Ht 71.0 in | Wt 241.2 lb

## 2020-02-19 DIAGNOSIS — I10 Essential (primary) hypertension: Secondary | ICD-10-CM | POA: Diagnosis not present

## 2020-02-19 DIAGNOSIS — E1159 Type 2 diabetes mellitus with other circulatory complications: Secondary | ICD-10-CM | POA: Diagnosis not present

## 2020-02-19 DIAGNOSIS — R109 Unspecified abdominal pain: Secondary | ICD-10-CM

## 2020-02-19 DIAGNOSIS — I152 Hypertension secondary to endocrine disorders: Secondary | ICD-10-CM

## 2020-02-19 LAB — URINALYSIS, ROUTINE W REFLEX MICROSCOPIC
Bilirubin Urine: NEGATIVE
Hgb urine dipstick: NEGATIVE
Ketones, ur: NEGATIVE
Nitrite: NEGATIVE
Specific Gravity, Urine: 1.025 (ref 1.000–1.030)
Total Protein, Urine: NEGATIVE
Urine Glucose: NEGATIVE
Urobilinogen, UA: 0.2 (ref 0.0–1.0)
pH: 5.5 (ref 5.0–8.0)

## 2020-02-19 LAB — COMPREHENSIVE METABOLIC PANEL
ALT: 34 U/L (ref 0–53)
AST: 29 U/L (ref 0–37)
Albumin: 4.3 g/dL (ref 3.5–5.2)
Alkaline Phosphatase: 61 U/L (ref 39–117)
BUN: 23 mg/dL (ref 6–23)
CO2: 25 mEq/L (ref 19–32)
Calcium: 9.3 mg/dL (ref 8.4–10.5)
Chloride: 104 mEq/L (ref 96–112)
Creatinine, Ser: 1.2 mg/dL (ref 0.40–1.50)
GFR: 58.07 mL/min — ABNORMAL LOW (ref >60.00)
Glucose, Bld: 105 mg/dL — ABNORMAL HIGH (ref 70–99)
Potassium: 4.5 mEq/L (ref 3.5–5.1)
Sodium: 137 mEq/L (ref 135–145)
Total Bilirubin: 0.7 mg/dL (ref 0.2–1.2)
Total Protein: 6.9 g/dL (ref 6.0–8.3)

## 2020-02-19 LAB — CBC
HCT: 49.7 % (ref 39.0–52.0)
Hemoglobin: 16.6 g/dL (ref 13.0–17.0)
MCHC: 33.4 g/dL (ref 30.0–36.0)
MCV: 92 fl (ref 78.0–100.0)
Platelets: 196 10*3/uL (ref 150.0–400.0)
RBC: 5.4 Mil/uL (ref 4.22–5.81)
RDW: 14.4 % (ref 11.5–15.5)
WBC: 8.1 10*3/uL (ref 4.0–10.5)

## 2020-02-19 LAB — TSH: TSH: 2.42 u[IU]/mL (ref 0.35–4.50)

## 2020-02-19 MED ORDER — AMLODIPINE BESYLATE 10 MG PO TABS: 10.0000 mg | ORAL_TABLET | Freq: Every day | ORAL | 1 refills | Status: DC

## 2020-02-19 NOTE — Assessment & Plan Note (Signed)
At goal.  Continue amlodipine 10 mg daily, metoprolol 25 mg twice daily, and quinapril 40 mg daily

## 2020-02-19 NOTE — Progress Notes (Signed)
   Brent Taylor is a 79 y.o. male who presents today for an office visit.  Assessment/Plan:  New/Acute Problems: Abdominal pain Benign abdominal exam today.  No red flags.  Possibly colonic spasm.  May be slightly constipated as well.  Discussed importance of good oral hydration.  Also recommended plenty of fiber in his diet.  Given that symptoms have essentially resolved, we will not pursue further work-up at this time.  If symptoms recur will need CT scan.  Back pain Likely due to degenerative disc disease.  Symptoms are chronic and stable.  He is following with neurosurgery. Will check UA, CBC, C met, and TSH to evaluate for other causes per patient request.   Chronic Problems Addressed Today: Hypertension associated with diabetes ([place]) At goal.  Continue amlodipine 10 mg daily, metoprolol 25 mg twice daily, and quinapril 40 mg daily  Type 2 diabetes mellitus with vascular disease ([place]) Check glucose with blood draw.     Subjective:  HPI:  Patient had an episode 3 days ago where he had sudden onset abdominal cramping.  Progressed to involve nausea and sweating.  He felt like he was going to pass out and laid in the floor.  He called his wife who then called EMS.  EMS came to his house and evaluated the patient.  At this time he was feeling better and decided not to go to the emergency room.  Over the last 2 days the last somewhat of a dull ache in his lower abdomen but is otherwise doing well.  He is worried about possible kidney issues due to chronic low back pain.  No fevers or chills.  He has had intermittent constipation and occasionally takes laxatives as needed.  He thinks he was dehydrated when symptoms happened as he was out cutting the grass and does not think that he was getting plenty of fluids.       Objective:  Physical Exam: BP 111/74   Pulse (!) 59   Temp (!) 97.5 F (36.4 C)   Ht '5\' 11"'$  (1.803 m)   Wt 241 lb 4 oz (109.4 kg)   SpO2 98%   BMI 33.65 kg/m   Gen:  No acute distress, resting comfortably CV: Regular rate and rhythm with no murmurs appreciated Pulm: Normal work of breathing, clear to auscultation bilaterally with no crackles, wheezes, or rhonchi GI: Soft, nontender, nondistended. Neuro: Grossly normal, moves all extremities Psych: Normal affect and thought content      Orton Capell M. [name], MD 02/19/2020 1:33 PM

## 2020-02-19 NOTE — Assessment & Plan Note (Signed)
Check glucose with blood draw.

## 2020-02-19 NOTE — Patient Instructions (Signed)
It was very nice to see you today!  I think you probably had a spasm in your colon.  This triggered a reflex to cause your other symptoms.  Please make sure that you are getting plenty of fluids.  We will check blood work and urine specimen today.  Please let me know if your symptoms return.  Take care, Dr [name]  Please try these tips to maintain a healthy lifestyle:   Eat at least 3 REAL meals and 1-2 snacks per day.  Aim for no more than 5 hours between eating.  If you eat breakfast, please do so within one hour of getting up.    Each meal should contain half fruits/vegetables, one quarter protein, and one quarter carbs (no bigger than a computer mouse)   Cut down on sweet beverages. This includes juice, soda, and sweet tea.     Drink at least 1 glass of water with each meal and aim for at least 8 glasses per day   Exercise at least 150 minutes every week.

## 2020-02-20 NOTE — Progress Notes (Signed)
Please inform patient of the following:  Labs all within acceptable ranges.  Do not need to do any further testing at this point. Would like for him to let us know if his symptoms return.  Algis Greenhouse. [name], MD 02/20/2020 8:08 AM

## 2020-02-22 DIAGNOSIS — M545 Low back pain: Secondary | ICD-10-CM | POA: Diagnosis not present

## 2020-03-03 ENCOUNTER — Encounter: Payer: Self-pay | Admitting: Family Medicine

## 2020-03-03 NOTE — Telephone Encounter (Signed)
Please advise 

## 2020-03-05 DIAGNOSIS — M47896 Other spondylosis, lumbar region: Secondary | ICD-10-CM | POA: Diagnosis not present

## 2020-03-10 ENCOUNTER — Other Ambulatory Visit: Payer: Self-pay | Admitting: Family Medicine

## 2020-03-11 ENCOUNTER — Other Ambulatory Visit: Payer: Self-pay | Admitting: Family Medicine

## 2020-03-11 DIAGNOSIS — 419620001 Death: Secondary | SNOMED CT | POA: Diagnosis not present

## 2020-03-11 DIAGNOSIS — M545 Low back pain, unspecified: Secondary | ICD-10-CM | POA: Insufficient documentation

## 2020-03-11 DIAGNOSIS — M47896 Other spondylosis, lumbar region: Secondary | ICD-10-CM | POA: Diagnosis not present

## 2020-03-11 DIAGNOSIS — M47816 Spondylosis without myelopathy or radiculopathy, lumbar region: Secondary | ICD-10-CM | POA: Diagnosis not present

## 2020-03-11 DIAGNOSIS — M48062 Spinal stenosis, lumbar region with neurogenic claudication: Secondary | ICD-10-CM | POA: Diagnosis not present

## 2020-03-11 DEATH — deceased

## 2020-03-12 ENCOUNTER — Other Ambulatory Visit: Payer: Self-pay | Admitting: Family Medicine

## 2020-03-13 ENCOUNTER — Other Ambulatory Visit: Payer: Self-pay | Admitting: Family Medicine

## 2020-03-30 ENCOUNTER — Other Ambulatory Visit: Payer: Self-pay | Admitting: Family Medicine

## 2020-03-31 DIAGNOSIS — M5136 Other intervertebral disc degeneration, lumbar region: Secondary | ICD-10-CM | POA: Diagnosis not present

## 2020-03-31 DIAGNOSIS — I2510 Atherosclerotic heart disease of native coronary artery without angina pectoris: Secondary | ICD-10-CM | POA: Diagnosis not present

## 2020-03-31 DIAGNOSIS — D6869 Other thrombophilia: Secondary | ICD-10-CM | POA: Diagnosis not present

## 2020-03-31 DIAGNOSIS — I4819 Other persistent atrial fibrillation: Secondary | ICD-10-CM | POA: Insufficient documentation

## 2020-04-23 ENCOUNTER — Encounter: Payer: Self-pay | Admitting: Family Medicine

## 2020-04-29 DIAGNOSIS — M5136 Other intervertebral disc degeneration, lumbar region: Secondary | ICD-10-CM | POA: Diagnosis not present

## 2020-05-12 DIAGNOSIS — E119 Type 2 diabetes mellitus without complications: Secondary | ICD-10-CM | POA: Diagnosis not present

## 2020-05-12 DIAGNOSIS — H524 Presbyopia: Secondary | ICD-10-CM | POA: Diagnosis not present

## 2020-06-02 ENCOUNTER — Encounter: Payer: Self-pay | Admitting: Family Medicine

## 2020-06-02 DIAGNOSIS — I2510 Atherosclerotic heart disease of native coronary artery without angina pectoris: Secondary | ICD-10-CM | POA: Diagnosis not present

## 2020-06-02 DIAGNOSIS — I272 Pulmonary hypertension, unspecified: Secondary | ICD-10-CM | POA: Diagnosis not present

## 2020-06-02 DIAGNOSIS — I10 Essential (primary) hypertension: Secondary | ICD-10-CM | POA: Diagnosis not present

## 2020-06-02 DIAGNOSIS — I517 Cardiomegaly: Secondary | ICD-10-CM | POA: Diagnosis not present

## 2020-06-02 DIAGNOSIS — E785 Hyperlipidemia, unspecified: Secondary | ICD-10-CM | POA: Diagnosis not present

## 2020-06-02 DIAGNOSIS — R7303 Prediabetes: Secondary | ICD-10-CM | POA: Diagnosis not present

## 2020-06-02 DIAGNOSIS — I119 Hypertensive heart disease without heart failure: Secondary | ICD-10-CM | POA: Diagnosis not present

## 2020-06-02 DIAGNOSIS — R0602 Shortness of breath: Secondary | ICD-10-CM | POA: Diagnosis not present

## 2020-06-02 DIAGNOSIS — R42 Dizziness and giddiness: Secondary | ICD-10-CM | POA: Diagnosis not present

## 2020-06-02 DIAGNOSIS — I340 Nonrheumatic mitral (valve) insufficiency: Secondary | ICD-10-CM | POA: Diagnosis not present

## 2020-06-02 DIAGNOSIS — I4510 Unspecified right bundle-branch block: Secondary | ICD-10-CM | POA: Diagnosis not present

## 2020-06-02 DIAGNOSIS — R110 Nausea: Secondary | ICD-10-CM | POA: Diagnosis not present

## 2020-06-02 DIAGNOSIS — R0902 Hypoxemia: Secondary | ICD-10-CM | POA: Diagnosis not present

## 2020-06-02 DIAGNOSIS — I4891 Unspecified atrial fibrillation: Secondary | ICD-10-CM | POA: Diagnosis not present

## 2020-06-03 ENCOUNTER — Other Ambulatory Visit: Payer: Self-pay

## 2020-06-03 DIAGNOSIS — I517 Cardiomegaly: Secondary | ICD-10-CM | POA: Diagnosis not present

## 2020-06-03 DIAGNOSIS — E785 Hyperlipidemia, unspecified: Secondary | ICD-10-CM | POA: Diagnosis not present

## 2020-06-03 DIAGNOSIS — I2510 Atherosclerotic heart disease of native coronary artery without angina pectoris: Secondary | ICD-10-CM | POA: Diagnosis not present

## 2020-06-03 DIAGNOSIS — I4510 Unspecified right bundle-branch block: Secondary | ICD-10-CM | POA: Diagnosis not present

## 2020-06-03 DIAGNOSIS — R0902 Hypoxemia: Secondary | ICD-10-CM | POA: Diagnosis not present

## 2020-06-03 DIAGNOSIS — I340 Nonrheumatic mitral (valve) insufficiency: Secondary | ICD-10-CM | POA: Diagnosis not present

## 2020-06-03 DIAGNOSIS — I119 Hypertensive heart disease without heart failure: Secondary | ICD-10-CM | POA: Diagnosis not present

## 2020-06-03 DIAGNOSIS — I4891 Unspecified atrial fibrillation: Secondary | ICD-10-CM | POA: Diagnosis not present

## 2020-06-03 DIAGNOSIS — I272 Pulmonary hypertension, unspecified: Secondary | ICD-10-CM | POA: Diagnosis not present

## 2020-06-03 DIAGNOSIS — R7303 Prediabetes: Secondary | ICD-10-CM | POA: Diagnosis not present

## 2020-06-03 MED ORDER — CLOPIDOGREL BISULFATE 75 MG PO TABS: 75.0000 mg | ORAL_TABLET | Freq: Every day | ORAL | 1 refills | Status: DC

## 2020-06-04 ENCOUNTER — Encounter: Payer: Self-pay | Admitting: Family Medicine

## 2020-06-04 DIAGNOSIS — I272 Pulmonary hypertension, unspecified: Secondary | ICD-10-CM | POA: Diagnosis not present

## 2020-06-04 DIAGNOSIS — R7303 Prediabetes: Secondary | ICD-10-CM | POA: Diagnosis not present

## 2020-06-04 DIAGNOSIS — I340 Nonrheumatic mitral (valve) insufficiency: Secondary | ICD-10-CM | POA: Diagnosis not present

## 2020-06-04 DIAGNOSIS — R0902 Hypoxemia: Secondary | ICD-10-CM | POA: Diagnosis not present

## 2020-06-04 DIAGNOSIS — I119 Hypertensive heart disease without heart failure: Secondary | ICD-10-CM | POA: Diagnosis not present

## 2020-06-04 DIAGNOSIS — I2510 Atherosclerotic heart disease of native coronary artery without angina pectoris: Secondary | ICD-10-CM | POA: Diagnosis not present

## 2020-06-04 DIAGNOSIS — I4510 Unspecified right bundle-branch block: Secondary | ICD-10-CM | POA: Diagnosis not present

## 2020-06-04 DIAGNOSIS — I4891 Unspecified atrial fibrillation: Secondary | ICD-10-CM | POA: Diagnosis not present

## 2020-06-04 DIAGNOSIS — E785 Hyperlipidemia, unspecified: Secondary | ICD-10-CM | POA: Diagnosis not present

## 2020-06-04 DIAGNOSIS — I517 Cardiomegaly: Secondary | ICD-10-CM | POA: Diagnosis not present

## 2020-06-05 NOTE — Telephone Encounter (Signed)
Appointment change to 06/10/2020 at 01:00 Pt aware

## 2020-06-05 NOTE — Telephone Encounter (Signed)
Patient follow up about this, and states its urgent

## 2020-06-06 ENCOUNTER — Other Ambulatory Visit: Payer: Self-pay | Admitting: *Deleted

## 2020-06-06 DIAGNOSIS — I4891 Unspecified atrial fibrillation: Secondary | ICD-10-CM

## 2020-06-06 NOTE — Telephone Encounter (Signed)
Referral placed.

## 2020-06-06 NOTE — Progress Notes (Signed)
cardiolog

## 2020-06-10 ENCOUNTER — Other Ambulatory Visit: Payer: Self-pay

## 2020-06-10 ENCOUNTER — Encounter: Payer: Self-pay | Admitting: Family Medicine

## 2020-06-10 ENCOUNTER — Ambulatory Visit (INDEPENDENT_AMBULATORY_CARE_PROVIDER_SITE_OTHER): Payer: Medicare HMO | Admitting: Family Medicine

## 2020-06-10 VITALS — BP 120/69 | HR 53 | Temp 97.8°F | Ht 71.0 in | Wt 246.0 lb

## 2020-06-10 DIAGNOSIS — I10 Essential (primary) hypertension: Secondary | ICD-10-CM

## 2020-06-10 DIAGNOSIS — I152 Hypertension secondary to endocrine disorders: Secondary | ICD-10-CM

## 2020-06-10 DIAGNOSIS — I4891 Unspecified atrial fibrillation: Secondary | ICD-10-CM | POA: Diagnosis not present

## 2020-06-10 DIAGNOSIS — E1159 Type 2 diabetes mellitus with other circulatory complications: Secondary | ICD-10-CM

## 2020-06-10 NOTE — Assessment & Plan Note (Signed)
Seems to be in sinus rhythm today.  Regular rate and rhythm on exam.  Continue diltiazem 180 mg daily and Eliquis 5 mg twice daily.  He will follow up with cardiology later this week.

## 2020-06-10 NOTE — Progress Notes (Signed)
   Brent Taylor is a 79 y.o. male who presents today for an office visit.  Assessment/Plan:  Chronic Problems Addressed Today: Hypertension associated with diabetes ([place]) Doing well with medication changes.  At goal today.  Continue metoprolol 25 mg twice daily, Accupril 20 mg daily, and diltiazem 180 mg daily.  Atrial fibrillation ([facility]) Seems to be in sinus rhythm today.  Regular rate and rhythm on exam.  Continue diltiazem 180 mg daily and Eliquis 5 mg twice daily.  He will follow up with cardiology later this week.    Subjective:  HPI:  Two weeks ago [place] started having symptoms include weakness, dizziness, and nausea.  Went to a local hospital and was found to be in atrial fibrillation. Was admitted for 2 nights and discharged about a week ago. Had evaluation at that time including labs and echocardiogram which were all normal.  He had several medication changes that time.  He was started on diltiazem at the time of discharge 180 mg daily.  Also had pravastatin dose increased to 40 mg daily.  Accupril was decreased to 20 mg daily.  He was started on apixiban 5mg  twice daily.   He has been doing well since being discharged home.  No chest pain.  No palpitations.  Tolerating medication changes well.       Objective:  Physical Exam: BP 120/69   Pulse (!) 53   Temp 97.8 F (36.6 C) (Temporal)   Ht 5\' 11"  (1.803 m)   Wt 246 lb (111.6 kg)   SpO2 95%   BMI 34.31 kg/m   Gen: No acute distress, resting comfortably CV: Regular rate and rhythm with no murmurs appreciated Pulm: Normal work of breathing, clear to auscultation bilaterally with no crackles, wheezes, or rhonchi Neuro: Grossly normal, moves all extremities Psych: Normal affect and thought content      Kasiah Manka M. [name], MD 06/10/2020 1:37 PM

## 2020-06-10 NOTE — Assessment & Plan Note (Signed)
Doing well with medication changes.  At goal today.  Continue metoprolol 25 mg twice daily, Accupril 20 mg daily, and diltiazem 180 mg daily.

## 2020-06-10 NOTE — Patient Instructions (Signed)
It was very nice to see you today!  I am glad that you are doing better.  We do not need to make any other changes today.  Please let me know if you need any other assistance.  Take care, Dr [name]  Please try these tips to maintain a healthy lifestyle:   Eat at least 3 REAL meals and 1-2 snacks per day.  Aim for no more than 5 hours between eating.  If you eat breakfast, please do so within one hour of getting up.    Each meal should contain half fruits/vegetables, one quarter protein, and one quarter carbs (no bigger than a computer mouse)   Cut down on sweet beverages. This includes juice, soda, and sweet tea.     Drink at least 1 glass of water with each meal and aim for at least 8 glasses per day   Exercise at least 150 minutes every week.

## 2020-06-12 ENCOUNTER — Encounter: Payer: Self-pay | Admitting: Internal Medicine

## 2020-06-12 ENCOUNTER — Other Ambulatory Visit: Payer: Self-pay

## 2020-06-12 ENCOUNTER — Ambulatory Visit: Payer: Medicare HMO | Admitting: Internal Medicine

## 2020-06-12 VITALS — BP 124/74 | HR 55 | Ht 72.0 in | Wt 247.6 lb

## 2020-06-12 DIAGNOSIS — I4891 Unspecified atrial fibrillation: Secondary | ICD-10-CM

## 2020-06-12 DIAGNOSIS — I10 Essential (primary) hypertension: Secondary | ICD-10-CM

## 2020-06-12 DIAGNOSIS — R0683 Snoring: Secondary | ICD-10-CM

## 2020-06-12 DIAGNOSIS — D6869 Other thrombophilia: Secondary | ICD-10-CM

## 2020-06-12 DIAGNOSIS — I480 Paroxysmal atrial fibrillation: Secondary | ICD-10-CM | POA: Diagnosis not present

## 2020-06-12 MED ORDER — DILTIAZEM HCL ER COATED BEADS 180 MG PO CP24: 180.0000 mg | ORAL_CAPSULE | Freq: Every day | ORAL | 3 refills | Status: DC | PRN

## 2020-06-12 MED ORDER — SILDENAFIL CITRATE 100 MG PO TABS: 100.0000 mg | ORAL_TABLET | Freq: Every day | ORAL | 0 refills | Status: DC | PRN

## 2020-06-12 NOTE — Progress Notes (Signed)
Electrophysiology Office Note   Date:  06/12/2020   ID:  Brent Taylor, DOB 05/04/1941, MRN [id number]  PCP:  [name], MD   Primary Electrophysiologist: Thompson Grayer, MD    CC: afib   History of Present Illness: Brent Taylor is a 79 y.o. male who presents today for electrophysiology evaluation.   He is referred by Dr [name] for EP consultation regarding afib.  The patient reports being active and in good health.  He did have an episode of afib while on vacation recently.  He is unaware of triggers/ precipitants.  He presented to a local hospital and was evaluated (notes reviewed).  Echo was normal. He was initiated on diltiazem and eliquis.  He converted to sinus rhythm.  He has been in sinus since that time.  He snores.  He has not had a sleep study.  + erectile dysfunction  Today, he denies symptoms of palpitations, chest pain, shortness of breath, orthopnea, PND, lower extremity edema, claudication, dizziness, presyncope, syncope, bleeding, or neurologic sequela. The patient is tolerating medications without difficulties and is otherwise without complaint today.    Past Medical History:  Diagnosis Date   Cancer ([place])    hx - melanoma   Chicken pox    Colon polyps    Depression    No need for therapy.  Improved   Diabetes ([place])    Diverticula, colon    Hemorrhoids    Hyperlipidemia    Hypertension    Sepsis ([place])    Tinnitus    UTI (lower urinary tract infection)    Past Surgical History:  Procedure Laterality Date   MELANOMA EXCISION     LEFT CHEST   MELANOMA EXCISION       Current Outpatient Medications  Medication Sig Dispense Refill   apixaban (ELIQUIS) 5 MG TABS tablet Take 5 mg by mouth 2 (two) times daily.     Blood Glucose Monitoring Suppl (ONE TOUCH ULTRA 2) w/Device KIT Use to check blood sugar 3-4 times daily 1 each 0   ibuprofen (ADVIL,MOTRIN) 200 MG tablet Take 600 mg by mouth every 6 (six) hours as needed for moderate pain.       metoprolol tartrate (LOPRESSOR) 25 MG tablet TAKE ONE TABLET BY MOUTH TWICE A DAY 36 tablet 1   pantoprazole (PROTONIX) 20 MG tablet Take 20 mg by mouth daily.     pravastatin (PRAVACHOL) 40 MG tablet Take 40 mg by mouth daily.     quinapril (ACCUPRIL) 20 MG tablet Take 20 mg by mouth at bedtime.     diltiazem (CARDIZEM CD) 180 MG 24 hr capsule Take 1 capsule (180 mg total) by mouth daily as needed (breakthrough palpitations). 90 capsule 3   sildenafil (VIAGRA) 100 MG tablet Take 1 tablet (100 mg total) by mouth daily as needed for erectile dysfunction. 10 tablet 0   No current facility-administered medications for this visit.    Allergies:   Lipitor [atorvastatin calcium]   Social History:  The patient  reports that he has never smoked. He has never used smokeless tobacco. He reports that he does not drink alcohol and does not use drugs.   Family History:  The patient's  family history includes Heart Problems in his mother.    ROS:  Please see the history of present illness.   All other systems are personally reviewed and negative.    PHYSICAL EXAM: VS:  BP 124/74    Pulse (!) 55    Ht 6' (1.829 m)  Wt 247 lb 9.6 oz (112.3 kg)    SpO2 91%    BMI 33.58 kg/m  , BMI Body mass index is 33.58 kg/m. GEN: Well nourished, well developed, in no acute distress HEENT: normal Neck: no JVD, carotid bruits, or masses Cardiac: RRR; no murmurs, rubs, or gallops,no edema  Respiratory:  clear to auscultation bilaterally, normal work of breathing GI: soft, nontender, nondistended, + BS MS: no deformity or atrophy Skin: warm and dry  Neuro:  Strength and sensation are intact Psych: euthymic mood, full affect  EKG:  EKG is ordered today. The ekg ordered today is personally reviewed and shows sinus rhythm 55 bpm, PR 216 msec,    Recent Labs: 02/19/2020: ALT 34; BUN 23; Creatinine, Ser 1.20; Hemoglobin 16.6; Platelets 196.0; Potassium 4.5; Sodium 137; TSH 2.42  personally reviewed   Lipid  Panel     Component Value Date/Time   CHOL 162 11/05/2019 0853   TRIG 197.0 (H) 11/05/2019 0853   HDL 34.80 (L) 11/05/2019 0853   CHOLHDL 5 11/05/2019 0853   VLDL 39.4 11/05/2019 0853   LDLCALC 87 11/05/2019 0853   LDLDIRECT 142.7 12/10/2011 1436   personally reviewed   Wt Readings from Last 3 Encounters:  06/12/20 247 lb 9.6 oz (112.3 kg)  06/10/20 246 lb (111.6 kg)  02/19/20 241 lb 4 oz (109.4 kg)      Other studies personally reviewed: Additional studies/ records that were reviewed today include:  Dr Ellwood Handler notes, recent records from OSH  Review of the above records today demonstrates: as above   ASSESSMENT AND PLAN:  1.  Paroxysmal atrial fibrillation Only a single episode chads2vasc score is at least 4.  Continue eliquis Take diltiazem only prn for recurrence of afib Lifestyle modification discussed at length  2. ED Prn viagra  3. HTN Stable No change required today  4. Snoring Sleep study is ordered  Risks, benefits and potential toxicities for medications prescribed and/or refilled reviewed with patient today.    Follow-up:  2 months in the AF clinic  Current medicines are reviewed at length with the patient today.   The patient does not have concerns regarding his medicines.  The following changes were made today:  none  Labs/ tests ordered today include:  Orders Placed This Encounter  Procedures   EKG 12-Lead   [place] Sleep Study     Signed, Thompson Grayer, MD  06/12/2020 9:35 PM     [place] [address] [phone] (office) [phone] (fax)

## 2020-06-12 NOTE — Patient Instructions (Addendum)
Medication Instructions:   Your physician has recommended you make the following change in your medication:   1.  You are now going to take your diltiazem 180 mg ONLY as needed.  2.  Viagra 100 mg-  Take as needed   Labwork: None ordered.  Testing/Procedures: Your physician has recommended that you have a sleep study. This test records several body functions during sleep, including: brain activity, eye movement, oxygen and carbon dioxide blood levels, heart rate and rhythm, breathing rate and rhythm, the flow of air through your mouth and nose, snoring, body muscle movements, and chest and belly movement.  This test will come in the mail.  Follow-Up: Your physician wants you to follow-up in: 2 months with the afib clinic.  Any Other Special Instructions Will Be Listed Below (If Applicable).  If you need a refill on your cardiac medications before your next appointment, please call your pharmacy.

## 2020-06-17 ENCOUNTER — Inpatient Hospital Stay: Payer: Medicare HMO | Admitting: Family Medicine

## 2020-06-19 ENCOUNTER — Telehealth (HOSPITAL_COMMUNITY): Payer: Self-pay | Admitting: *Deleted

## 2020-06-19 ENCOUNTER — Encounter (HOSPITAL_COMMUNITY): Payer: Self-pay

## 2020-06-19 ENCOUNTER — Other Ambulatory Visit: Payer: Self-pay | Admitting: Internal Medicine

## 2020-06-19 NOTE — Telephone Encounter (Signed)
Patient has been monitoring his HR/rhythm since seeing Dr. Rayann Heman last week - all day today his HR has been more erratic in the 60-80s with the indication from ekg of possible afib. Pt states he feels no differently. Reminded pt he can use his cardizem PRN since taken off daily - if having to take frequently may have to look at daily dosing again. Pt will monitor Hrs if continues to be erratic may decide to take PRN medication.

## 2020-06-20 NOTE — Telephone Encounter (Signed)
Pt states his heart rate stabilized without needing cardizem. PT will continue to monitor his heart rates.

## 2020-06-23 ENCOUNTER — Inpatient Hospital Stay: Payer: Medicare HMO | Admitting: Family Medicine

## 2020-06-25 ENCOUNTER — Ambulatory Visit (HOSPITAL_COMMUNITY)
Admission: RE | Admit: 2020-06-25 | Discharge: 2020-06-25 | Disposition: A | Discharge status: Home / Self Care | Payer: Medicare HMO | Source: Ambulatory Visit | Attending: Nurse Practitioner | Admitting: Nurse Practitioner

## 2020-06-25 ENCOUNTER — Encounter (HOSPITAL_COMMUNITY): Payer: Self-pay | Admitting: Nurse Practitioner

## 2020-06-25 ENCOUNTER — Other Ambulatory Visit: Payer: Self-pay

## 2020-06-25 ENCOUNTER — Other Ambulatory Visit: Payer: Self-pay | Admitting: Family Medicine

## 2020-06-25 VITALS — BP 156/86 | HR 52 | Ht 72.0 in | Wt 248.0 lb

## 2020-06-25 DIAGNOSIS — Z7901 Long term (current) use of anticoagulants: Secondary | ICD-10-CM | POA: Diagnosis not present

## 2020-06-25 DIAGNOSIS — E785 Hyperlipidemia, unspecified: Secondary | ICD-10-CM | POA: Diagnosis not present

## 2020-06-25 DIAGNOSIS — I480 Paroxysmal atrial fibrillation: Secondary | ICD-10-CM | POA: Insufficient documentation

## 2020-06-25 DIAGNOSIS — Z888 Allergy status to other drugs, medicaments and biological substances status: Secondary | ICD-10-CM | POA: Diagnosis not present

## 2020-06-25 DIAGNOSIS — Z79899 Other long term (current) drug therapy: Secondary | ICD-10-CM | POA: Diagnosis not present

## 2020-06-25 DIAGNOSIS — D6869 Other thrombophilia: Secondary | ICD-10-CM | POA: Diagnosis not present

## 2020-06-25 DIAGNOSIS — I10 Essential (primary) hypertension: Secondary | ICD-10-CM | POA: Diagnosis not present

## 2020-06-25 DIAGNOSIS — Z85820 Personal history of malignant melanoma of skin: Secondary | ICD-10-CM | POA: Diagnosis not present

## 2020-06-25 MED ORDER — DILTIAZEM HCL 30 MG PO TABS: ORAL_TABLET | ORAL | 1 refills | Status: DC

## 2020-06-25 NOTE — Progress Notes (Addendum)
Primary Care Physician: [name], MD Referring Physician: Dr. Rayann Heman EP: Dr. Macario Golds Brent Taylor is a 79 y.o. male with a h/o of new dx of paroxysmal afib while on vacation in [place]. He developed dizziness, nausea/clamminess and when it persisted he went to the ER there and was found in afib. [name] He was kept a couple of days. He saw Dr. Rayann Heman on return from there. He had been placed on daily Cardizem along with his previous BB. Since he just had the one episode, he advised to stop it and use 180 Cardizem as needed. He was also placed on eliquis 5 mg bid for a CHA2DS2VASc score of 6. No alcohol or tobacco or excessive caffeine. He does snore, is pending a home study but states he was screened overnight in the hospital for sleep apnea and was told no issues observed.    He has since called here since seeing Dr. Rayann Heman reporting afib. Once he took the 180 mg cardizem, the other time the episode passed in a few hours.   He walked into the afib waiting room, wanting to be seen as he said Dr. Rayann Heman told him to come over anytime he needed attention.He woke this am feeling nauseated, dizzy. Clammy.[name] He took his BP without his am meds and had a systolic BP around 469 systolic. He ran a strip off his watch and it showed sinus brady. He took his am pills  with the extra 180 mg Cardizem and left for here. He assumed since he felt similar with his afib episode in [place], it was related.   EKG shows sinus brady at 52 bpm. His BP is 156/86. He is now feeling better. Review of his Apple strips show some afib, some SR with PAC's and some sinus bradycardia, some that appear to be junctional, but often has artifact, so difficult  to interpret. SR this am.  Today, he denies symptoms of palpitations, chest pain, shortness of breath, orthopnea, PND, lower extremity edema, dizziness, presyncope, syncope, or neurologic sequela. The patient is tolerating medications without difficulties and is otherwise  without complaint today.   Past Medical History:  Diagnosis Date  . Cancer ([facility])    hx - melanoma  . Chicken pox   . Colon polyps   . Depression    No need for therapy.  Improved  . Diabetes ([place])   . Diverticula, colon   . Hemorrhoids   . Hyperlipidemia   . Hypertension   . Sepsis ([place])   . Tinnitus   . UTI (lower urinary tract infection)    Past Surgical History:  Procedure Laterality Date  . MELANOMA EXCISION     LEFT CHEST  . MELANOMA EXCISION      Current Outpatient Medications  Medication Sig Dispense Refill  . apixaban (ELIQUIS) 5 MG TABS tablet Take 5 mg by mouth 2 (two) times daily.    [name] ibuprofen (ADVIL,MOTRIN) 200 MG tablet Take 600 mg by mouth every 6 (six) hours as needed for moderate pain.     . metoprolol tartrate (LOPRESSOR) 25 MG tablet TAKE ONE TABLET BY MOUTH TWICE A DAY 36 tablet 1  . pantoprazole (PROTONIX) 20 MG tablet Take 20 mg by mouth daily.    . pravastatin (PRAVACHOL) 40 MG tablet Take 40 mg by mouth daily.    . quinapril (ACCUPRIL) 20 MG tablet Take 20 mg by mouth at bedtime.    . Blood Glucose Monitoring Suppl (ONE TOUCH ULTRA 2) w/Device KIT Use  to check blood sugar 3-4 times daily 1 each 0  . diltiazem (CARDIZEM) 30 MG tablet Take 1 tablet every 4 hours AS NEEDED for afib heart rate >100 as long as top blood pressure >100. 45 tablet 1  . sildenafil (VIAGRA) 100 MG tablet Take 1 tablet (100 mg total) by mouth daily as needed for erectile dysfunction. (Patient not taking: Reported on 06/25/2020) 10 tablet 0   No current facility-administered medications for this encounter.    Allergies  Allergen Reactions  . Lipitor [Atorvastatin Calcium]     Muscle ache    Social History   Socioeconomic History  . Marital status: Married    Spouse name: Not on file  . Number of children: 2  . Years of education: Not on file  . Highest education level: Not on file  Occupational History  . Occupation: Company secretary  Tobacco Use  . Smoking status: Never  Smoker  . Smokeless tobacco: Never Used  Substance and Sexual Activity  . Alcohol use: No  . Drug use: No  . Sexual activity: Not on file  Other Topics Concern  . Not on file  Social History Narrative   Lives with wife in [place].   Retired [employer]   Attends [place] Strain:   . Difficulty of Paying Living Expenses: Not on file  Food Insecurity:   . Worried About Charity fundraiser in the Last Year: Not on file  . Ran Out of Food in the Last Year: Not on file  Transportation Needs:   . Lack of Transportation (Medical): Not on file  . Lack of Transportation (Non-Medical): Not on file  Physical Activity:   . Days of Exercise per Week: Not on file  . Minutes of Exercise per Session: Not on file  Stress:   . Feeling of Stress : Not on file  Social Connections:   . Frequency of Communication with Friends and Family: Not on file  . Frequency of Social Gatherings with Friends and Family: Not on file  . Attends Religious Services: Not on file  . Active Member of Clubs or Organizations: Not on file  . Attends Archivist Meetings: Not on file  . Marital Status: Not on file  Intimate Partner Violence:   . Fear of Current or Ex-Partner: Not on file  . Emotionally Abused: Not on file  . Physically Abused: Not on file  . Sexually Abused: Not on file    Family History  Problem Relation Age of Onset  . Heart Problems Mother        Skipping    ROS- All systems are reviewed and negative except as per the HPI above  Physical Exam: Vitals:   06/25/20 0907  BP: (!) 156/86  Pulse: (!) 52  Weight: 112.5 kg  Height: 6' (1.829 m)   Wt Readings from Last 3 Encounters:  06/25/20 112.5 kg  06/12/20 112.3 kg  06/10/20 111.6 kg    Labs: Lab Results  Component Value Date   NA 137 02/19/2020   K 4.5 02/19/2020   CL 104 02/19/2020   CO2 25 02/19/2020   GLUCOSE 105 (H) 02/19/2020   BUN  23 02/19/2020   CREATININE 1.20 02/19/2020   CALCIUM 9.3 02/19/2020   Lab Results  Component Value Date   INR 1.26 11/10/2015   Lab Results  Component Value Date   CHOL 162 11/05/2019   HDL 34.80 (L)  11/05/2019   LDLCALC 87 11/05/2019   TRIG 197.0 (H) 11/05/2019     GEN- The patient is well appearing, alert and oriented x 3 today.   Head- normocephalic, atraumatic Eyes-  Sclera clear, conjunctiva pink Ears- hearing intact Oropharynx- clear Neck- supple, no JVP Lymph- no cervical lymphadenopathy Lungs- Clear to ausculation bilaterally, normal work of breathing Heart- slow regular rate and rhythm, no murmurs, rubs or gallops, PMI not laterally displaced GI- soft, NT, ND, + BS Extremities- no clubbing, cyanosis, or edema MS- no significant deformity or atrophy Skin- no rash or lesion Psych- euthymic mood, full affect Neuro- strength and sensation are intact  EKG-sinus brady at 52 bpm, with PAC's     Assessment and Plan: 1. New onset afib  Recently found in [place] late August  He had a  "spell "  this am but was in [place] Reviewing his apple  watch strips, I am impressed that he is very slow at times. Often appears to be a junctional rhythm with some afib with SVR and SR with PAC's noted.  I will place a one week zio patch to further understand rhythm, question SSS. I do not advice daily cardizem as I am seeing the slow heart response, but stay on his daily metoprolol I will send in rx for cardizem 30 mg to use  if in afib AND afib has a v response over 100 bpm   2. HTN Now better controlled than his reading of 615 systolic this am  3. CHA2DS2VASc score of 6 Continue eliquis 5 mg bid Bleeding precautions reviewed   I will see back in around 3 weeks, sooner if needed   Butch Penny C. Kimaria Struthers, [facility] [address] [place], [place] [address] [phone]

## 2020-06-25 NOTE — Addendum Note (Signed)
Encounter addended by: Sherran Needs, NP on: 06/25/2020 1:50 PM  Actions taken: Level of Service modified

## 2020-06-25 NOTE — Patient Instructions (Addendum)
Cardizem 30mg  -- take 1 tablet every 4 hours AS NEEDED for afib heart rate >100 as long as top number of blood pressure >100.   Mail in monitor on 9/22

## 2020-06-26 ENCOUNTER — Encounter: Payer: Self-pay | Admitting: Family Medicine

## 2020-06-26 NOTE — Telephone Encounter (Signed)
Spoke  with patient, patient stated felt light headed and broke in a sweat yesterday morning when getting up. Took his medication with a banana and felt better. This episode last about 1 min. Today had same Sx lasting for a few second. Stated he had medication changes and was wondering if this was the cause of it.  Rx Eliquis 5mg  and Rx quinapril 40mg  decrease to  20mg . No other symptoms at this time. Pt aware if symptoms return or worsen go to ED  Please advise

## 2020-06-27 DIAGNOSIS — H1131 Conjunctival hemorrhage, right eye: Secondary | ICD-10-CM | POA: Diagnosis not present

## 2020-06-27 DIAGNOSIS — H532 Diplopia: Secondary | ICD-10-CM | POA: Diagnosis not present

## 2020-06-27 NOTE — Telephone Encounter (Signed)
Call patient and informed Per dr [name] Could be due to his BP med changes. Would like for him to make sure he is getting plenty of fluids and let or his cardiologist know if symptoms persist. Pt stated no symptom  Today. And Doing good

## 2020-06-30 ENCOUNTER — Encounter: Payer: Self-pay | Admitting: Family Medicine

## 2020-06-30 ENCOUNTER — Encounter (HOSPITAL_COMMUNITY): Payer: Self-pay

## 2020-06-30 ENCOUNTER — Other Ambulatory Visit: Payer: Self-pay | Admitting: Family Medicine

## 2020-06-30 NOTE — Telephone Encounter (Signed)
Please advise 

## 2020-06-30 NOTE — Telephone Encounter (Signed)
Refills request Rx Quinapril, Rx Protonix, Rx Plavix   Refill by historical provider  LOV 06/10/2020

## 2020-07-01 ENCOUNTER — Encounter (HOSPITAL_COMMUNITY): Payer: Self-pay

## 2020-07-01 ENCOUNTER — Encounter: Payer: Self-pay | Admitting: Family Medicine

## 2020-07-01 ENCOUNTER — Other Ambulatory Visit: Payer: Self-pay | Admitting: *Deleted

## 2020-07-01 NOTE — Telephone Encounter (Signed)
Medication updated

## 2020-07-04 ENCOUNTER — Other Ambulatory Visit: Payer: Self-pay

## 2020-07-04 ENCOUNTER — Encounter: Payer: Self-pay | Admitting: Family Medicine

## 2020-07-04 DIAGNOSIS — H1131 Conjunctival hemorrhage, right eye: Secondary | ICD-10-CM | POA: Diagnosis not present

## 2020-07-04 MED ORDER — APIXABAN 5 MG PO TABS
5.0000 mg | ORAL_TABLET | Freq: Two times a day (BID) | ORAL | 5 refills | Status: DC
Start: 2020-07-04 — End: 2020-10-13

## 2020-07-04 NOTE — Telephone Encounter (Signed)
Pt last saw Roderic Palau, NP 06/25/20, last labs 02/19/20 Creat 1.20, age 79, weight 112.5kg, based on specified criteria pt is on appropriate dosage of Eliquis 5mg  BID.  Will refill rx.

## 2020-07-07 ENCOUNTER — Encounter: Payer: Self-pay | Admitting: Family Medicine

## 2020-07-08 ENCOUNTER — Other Ambulatory Visit: Payer: Self-pay

## 2020-07-08 MED ORDER — QUINAPRIL HCL 20 MG PO TABS
20.0000 mg | ORAL_TABLET | Freq: Every day | ORAL | 5 refills | Status: DC
Start: 2020-07-08 — End: 2021-01-05

## 2020-07-11 DIAGNOSIS — D0462 Carcinoma in situ of skin of left upper limb, including shoulder: Secondary | ICD-10-CM | POA: Diagnosis not present

## 2020-07-11 DIAGNOSIS — Z85820 Personal history of malignant melanoma of skin: Secondary | ICD-10-CM | POA: Diagnosis not present

## 2020-07-11 DIAGNOSIS — D485 Neoplasm of uncertain behavior of skin: Secondary | ICD-10-CM | POA: Diagnosis not present

## 2020-07-11 DIAGNOSIS — I480 Paroxysmal atrial fibrillation: Secondary | ICD-10-CM | POA: Diagnosis not present

## 2020-07-11 DIAGNOSIS — Z85828 Personal history of other malignant neoplasm of skin: Secondary | ICD-10-CM | POA: Diagnosis not present

## 2020-07-11 DIAGNOSIS — L821 Other seborrheic keratosis: Secondary | ICD-10-CM | POA: Diagnosis not present

## 2020-07-23 ENCOUNTER — Ambulatory Visit (HOSPITAL_COMMUNITY): Payer: Medicare HMO | Admitting: Nurse Practitioner

## 2020-07-28 ENCOUNTER — Other Ambulatory Visit: Payer: Self-pay

## 2020-07-28 ENCOUNTER — Encounter: Payer: Self-pay | Admitting: Internal Medicine

## 2020-07-28 ENCOUNTER — Other Ambulatory Visit: Payer: Self-pay | Admitting: *Deleted

## 2020-07-28 ENCOUNTER — Encounter: Payer: Self-pay | Admitting: Family Medicine

## 2020-07-28 ENCOUNTER — Ambulatory Visit: Payer: Medicare HMO | Admitting: Internal Medicine

## 2020-07-28 VITALS — BP 118/84 | HR 47 | Ht 72.0 in | Wt 246.8 lb

## 2020-07-28 DIAGNOSIS — R69 Illness, unspecified: Secondary | ICD-10-CM | POA: Diagnosis not present

## 2020-07-28 DIAGNOSIS — R0683 Snoring: Secondary | ICD-10-CM | POA: Diagnosis not present

## 2020-07-28 DIAGNOSIS — I10 Essential (primary) hypertension: Secondary | ICD-10-CM | POA: Diagnosis not present

## 2020-07-28 DIAGNOSIS — I480 Paroxysmal atrial fibrillation: Secondary | ICD-10-CM | POA: Diagnosis not present

## 2020-07-28 MED ORDER — ONETOUCH ULTRASOFT LANCETS MISC: 12 refills | Status: DC

## 2020-07-28 MED ORDER — GLUCOSE BLOOD VI STRP: ORAL_STRIP | 3 refills | Status: DC

## 2020-07-28 MED ORDER — METOPROLOL TARTRATE 25 MG PO TABS: 12.5000 mg | ORAL_TABLET | Freq: Two times a day (BID) | ORAL | 3 refills | Status: DC

## 2020-07-28 NOTE — Patient Instructions (Signed)
Medication Instructions:  1 reduce you Metoprolol to 12.5 mg two times a day  *If you need a refill on your cardiac medications before your next appointment, please call your pharmacy*  Lab Work: None ordered.  If you have labs (blood work) drawn today and your tests are completely normal, you will receive your results only by: [name] MyChart Message (if you have MyChart) OR . A paper copy in the mail If you have any lab test that is abnormal or we need to change your treatment, we will call you to review the results.  Testing/Procedures: None ordered.  Follow-Up: At [facility], you and your health needs are our priority.  As part of our continuing mission to provide you with exceptional heart care, we have created designated Provider Care Teams.  These Care Teams include your primary Cardiologist (physician) and Advanced Practice Providers (APPs -  Physician Assistants and Nurse Practitioners) who all work together to provide you with the care you need, when you need it.  We recommend signing up for the patient portal called "MyChart".  Sign up information is provided on this After Visit Summary.  MyChart is used to connect with patients for Virtual Visits (Telemedicine).  Patients are able to view lab/test results, encounter notes, upcoming appointments, etc.  Non-urgent messages can be sent to your provider as well.   To learn more about what you can do with MyChart, go to [email or web address].    Your next appointment:   Your physician wants you to follow-up in: 3 months with the [facility]. They will contact you to schedule.    Other Instructions:

## 2020-07-28 NOTE — Progress Notes (Signed)
PCP: [name], MD   Primary EP: Dr Jackqulyn Livings is a 79 y.o. male who presents today for routine electrophysiology followup.  Since last being seen in our clinic, the patient reports doing very well.  He continues to have occasional palpitations.  Today, he denies symptoms of chest pain, shortness of breath,  lower extremity edema, presyncope, or syncope.  He snores and has not yet had his sleep study.  Recent episodes of nausea and dizziness corresponded to pacs/ nonsustained atach on his monitor.  He also has frequent sinus bradycardia.  The patient is otherwise without complaint today.   Past Medical History:  Diagnosis Date  . Cancer (HCC)    hx - melanoma  . Chicken pox   . Colon polyps   . Depression    No need for therapy.  Improved  . Diabetes ([place])   . Diverticula, colon   . Hemorrhoids   . Hyperlipidemia   . Hypertension   . Sepsis ([place])   . Tinnitus   . UTI (lower urinary tract infection)    Past Surgical History:  Procedure Laterality Date  . MELANOMA EXCISION     LEFT CHEST  . MELANOMA EXCISION      ROS- all systems are reviewed and negatives except as per HPI above  Current Outpatient Medications  Medication Sig Dispense Refill  . apixaban (ELIQUIS) 5 MG TABS tablet Take 1 tablet (5 mg total) by mouth 2 (two) times daily. 60 tablet 5  . Blood Glucose Monitoring Suppl (ONE TOUCH ULTRA 2) w/Device KIT Use to check blood sugar 3-4 times daily 1 each 0  . diltiazem (CARDIZEM) 30 MG tablet Take 1 tablet every 4 hours AS NEEDED for afib heart rate >100 as long as top blood pressure >100. 45 tablet 1  . ibuprofen (ADVIL,MOTRIN) 200 MG tablet Take 600 mg by mouth every 6 (six) hours as needed for moderate pain.     . metoprolol tartrate (LOPRESSOR) 25 MG tablet TAKE 1 TABLET BY MOUTH TWO TIMES A DAY 180 tablet 3  . pantoprazole (PROTONIX) 20 MG tablet TAKE ONE TABLET BY MOUTH DAILY 90 tablet 3  . pravastatin (PRAVACHOL) 40 MG tablet Take 40 mg by mouth  daily.    . quinapril (ACCUPRIL) 20 MG tablet Take 1 tablet (20 mg total) by mouth at bedtime. 30 tablet 5  . sildenafil (VIAGRA) 100 MG tablet Take 1 tablet (100 mg total) by mouth daily as needed for erectile dysfunction. 10 tablet 0   No current facility-administered medications for this visit.    Physical Exam: Vitals:   07/28/20 1132  BP: 118/84  Pulse: (!) 47  SpO2: 96%  Weight: 246 lb 12.8 oz (111.9 kg)  Height: 6' (1.829 m)    GEN- The patient is well appearing, alert and oriented x 3 today.   Head- normocephalic, atraumatic Eyes-  Sclera clear, conjunctiva pink Ears- hearing intact Oropharynx- clear Lungs- Clear to ausculation bilaterally, normal work of breathing Heart- Regular rate and rhythm, no murmurs, rubs or gallops, PMI not laterally displaced GI- soft, NT, ND, + BS Extremities- no clubbing, cyanosis, or edema  Wt Readings from Last 3 Encounters:  07/28/20 246 lb 12.8 oz (111.9 kg)  06/25/20 248 lb (112.5 kg)  06/12/20 247 lb 9.6 oz (112.3 kg)    EKG tracing ordered today is personally reviewed and shows sinus rhythm  Event monitor is reviewed and reveals sinus rhythm with frequent atrial ectopy.  Assessment and Plan:  1. Paroxysmal atrial fibrillation/ pacs/ nonsustained atach Doing well with low burden Follows symptoms with his apple watch chads2vasc score is 6.  He is on eliquis Repeat echo on follow-up in AF clinic We discussed lifestyle modification today  2. HTN Stable No change required today  3. snoring Sleep study is pending  4. Sinus bradycardia Reduce metoprolol to 12.97m BID  Risks, benefits and potential toxicities for medications prescribed and/or refilled reviewed with patient today.   Follow-up in the AF clinic every 3 months I will see when needed  JThompson GrayerMD, FM[facility] Clinic Inc10/18/2021 11:52 AM

## 2020-08-05 ENCOUNTER — Encounter (INDEPENDENT_AMBULATORY_CARE_PROVIDER_SITE_OTHER): Payer: Medicare HMO | Admitting: Cardiology

## 2020-08-05 DIAGNOSIS — G4733 Obstructive sleep apnea (adult) (pediatric): Secondary | ICD-10-CM | POA: Diagnosis not present

## 2020-08-06 ENCOUNTER — Telehealth: Payer: Self-pay | Admitting: Family Medicine

## 2020-08-06 NOTE — Telephone Encounter (Signed)
Left message for patient to call back and schedule Medicare Annual Wellness Visit (AWV) either virtually/audio only OR in office. Whatever the patients preference is.  Last AWV 07/17/2019 please schedule at anytime with [facility]-Nurse Health Advisor at [facility].  This should be a 45 minute visit.

## 2020-08-07 ENCOUNTER — Encounter: Payer: Self-pay | Admitting: Family Medicine

## 2020-08-11 ENCOUNTER — Encounter: Payer: Self-pay | Admitting: Family Medicine

## 2020-08-11 ENCOUNTER — Ambulatory Visit (INDEPENDENT_AMBULATORY_CARE_PROVIDER_SITE_OTHER): Payer: Medicare HMO

## 2020-08-11 DIAGNOSIS — Z0000 Encounter for general adult medical examination without abnormal findings: Secondary | ICD-10-CM

## 2020-08-11 NOTE — Progress Notes (Signed)
Virtual Visit via Telephone Note  I connected with  Brent Taylor on 08/11/20 at  1:00 PM EDT by telephone and verified that I am speaking with the correct person using two identifiers.  Medicare Annual Wellness visit completed telephonically due to Covid-19 pandemic.   Persons participating in this call: This Health Coach and this patient.   Location: Patient: Home Provider: Office   I discussed the limitations, risks, security and privacy concerns of performing an evaluation and management service by telephone and the availability of in person appointments. The patient expressed understanding and agreed to proceed.  Unable to perform video visit due to video visit attempted and failed and/or patient does not have video capability.   Some vital signs may be absent or patient reported.   Willette Brace, LPN    Subjective:   Brent Taylor is a 79 y.o. male who presents for Medicare Annual/Subsequent preventive examination.  Review of Systems     Cardiac Risk Factors include: male gender;hypertension;diabetes mellitus;dyslipidemia;obesity (BMI >30kg/m2)     Objective:    There were no vitals filed for this visit. There is no height or weight on file to calculate BMI.  Advanced Directives 08/11/2020 07/17/2019 09/14/2016 04/25/2016 12/28/2015 11/10/2015 11/09/2015  Does Patient Have a Medical Advance Directive? Yes Yes Yes No No No No  Type of Paramedic of [place];Living will Living will;Healthcare Power of Attorney - - - - -  Does patient want to make changes to medical advance directive? - No - Patient declined - - - - -  Copy of [place] in Chart? No - copy requested No - copy requested - - - - -  Would patient like information on creating a medical advance directive? - - - - No - patient declined information No - patient declined information -    Current Medications (verified) Outpatient Encounter Medications as of 08/11/2020    Medication Sig  . apixaban (ELIQUIS) 5 MG TABS tablet Take 1 tablet (5 mg total) by mouth 2 (two) times daily.  . Blood Glucose Monitoring Suppl (ONE TOUCH ULTRA 2) w/Device KIT Use to check blood sugar 3-4 times daily  . diltiazem (CARDIZEM) 30 MG tablet Take 1 tablet every 4 hours AS NEEDED for afib heart rate >100 as long as top blood pressure >100.  [name] FLUoxetine (PROZAC) 40 MG capsule Take 20 mg by mouth daily.  [name] glucose blood test strip Use to test blood sugars daily. Dx: E11.9  . ibuprofen (ADVIL,MOTRIN) 200 MG tablet Take 600 mg by mouth every 6 (six) hours as needed for moderate pain.   . Lancets (ONETOUCH ULTRASOFT) lancets Use to test blood sugars daily. Dx: E11.9  . metoprolol tartrate (LOPRESSOR) 25 MG tablet Take 0.5 tablets (12.5 mg total) by mouth 2 (two) times daily.  . pantoprazole (PROTONIX) 20 MG tablet TAKE ONE TABLET BY MOUTH DAILY  . pravastatin (PRAVACHOL) 40 MG tablet Take 40 mg by mouth daily.  . quinapril (ACCUPRIL) 20 MG tablet Take 1 tablet (20 mg total) by mouth at bedtime.  . sildenafil (VIAGRA) 100 MG tablet Take 1 tablet (100 mg total) by mouth daily as needed for erectile dysfunction.   No facility-administered encounter medications on file as of 08/11/2020.    Allergies (verified) Lipitor [atorvastatin calcium]   History: Past Medical History:  Diagnosis Date  . Cancer (HCC)    hx - melanoma  . Chicken pox   . Colon polyps   . Depression  No need for therapy.  Improved  . Diabetes ([place])   . Diverticula, colon   . Hemorrhoids   . Hyperlipidemia   . Hypertension   . Sepsis ([place])   . Tinnitus   . UTI (lower urinary tract infection)    Past Surgical History:  Procedure Laterality Date  . MELANOMA EXCISION     LEFT CHEST  . MELANOMA EXCISION     Family History  Problem Relation Age of Onset  . Heart Problems Mother        Skipping   Social History   Socioeconomic History  . Marital status: Married    Spouse name: Not on file  .  Number of children: 2  . Years of education: Not on file  . Highest education level: Not on file  Occupational History  . Occupation: Company secretary  . Occupation: retired  Tobacco Use  . Smoking status: Never Smoker  . Smokeless tobacco: Never Used  Substance and Sexual Activity  . Alcohol use: No  . Drug use: No  . Sexual activity: Not on file  Other Topics Concern  . Not on file  Social History Narrative   Lives with wife in [place].   Retired [employer]   Attends [place] Strain: [place]   . Difficulty of Paying Living Expenses: Not hard at all  Food Insecurity: No Food Insecurity  . Worried About Charity fundraiser in the Last Year: Never true  . Ran Out of Food in the Last Year: Never true  Transportation Needs: No Transportation Needs  . Lack of Transportation (Medical): No  . Lack of Transportation (Non-Medical): No  Physical Activity: Sufficiently Active  . Days of Exercise per Week: 5 days  . Minutes of Exercise per Session: 30 min  Stress: No Stress Concern Present  . Feeling of Stress : Not at all  Social Connections: Moderately Integrated  . Frequency of Communication with Friends and Family: More than three times a week  . Frequency of Social Gatherings with Friends and Family: More than three times a week  . Attends Religious Services: More than 4 times per year  . Active Member of Clubs or Organizations: No  . Attends Archivist Meetings: Never  . Marital Status: Married    Tobacco Counseling Counseling given: Not Answered   Clinical Intake:  Pre-visit preparation completed: Yes  Pain : No/denies pain     BMI - recorded: 33.47 Nutritional Status: BMI > 30  Obese Nutritional Risks: None Diabetes: Yes CBG done?: Yes (123) CBG resulted in Enter/ Edit results?: No Did pt. bring in CBG monitor from home?: No  How often do you need to have someone help you  when you read instructions, pamphlets, or other written materials from your doctor or pharmacy?: 1 - Never  Diabetic?Nutrition Risk Assessment:  Has the patient had any N/V/D within the last 2 months?  No  Does the patient have any non-healing wounds?  No  Has the patient had any unintentional weight loss or weight gain?  No   Diabetes:  Is the patient diabetic?  Yes  If diabetic, was a CBG obtained today?  Yes  Did the patient bring in their glucometer from home?  No  How often do you monitor your CBG's? Daily.   Financial Strains and Diabetes Management:  Are you having any financial strains with the device, your supplies or your medication? No .  Does the patient want to be seen by Chronic Care Management for management of their diabetes?  No  Would the patient like to be referred to a Nutritionist or for Diabetic Management?  No   Diabetic Exams:  Diabetic Eye Exam: Completed 07/04/20 Diabetic Foot Exam: Overdue, Pt has been advised about the importance in completing this exam. Pt is scheduled for diabetic foot exam on will follow up next appt .   Interpreter Needed?: No  Information entered by :: Charlott Rakes, LPN   Activities of Daily Living In your present state of health, do you have any difficulty performing the following activities: 08/11/2020  Hearing? Y  Comment mild loss seeing Dr Redmond Baseman for appt  Vision? N  Difficulty concentrating or making decisions? N  Walking or climbing stairs? N  Dressing or bathing? N  Doing errands, shopping? N  Preparing Food and eating ? N  Using the Toilet? N  In the past six months, have you accidently leaked urine? N  Do you have problems with loss of bowel control? N  Managing your Medications? N  Managing your Finances? N  Housekeeping or managing your Housekeeping? N  Some recent data might be hidden    Patient Care Team: [name], MD as PCP - General (Family Medicine)  Indicate any recent Medical Services you  may have received from other than [facility] providers in the past year (date may be approximate).     Assessment:   This is a routine wellness examination for [place].  Hearing/Vision screen  Hearing Screening   '125Hz'$  $Remo'250Hz'FIYgH$'500Hz'$'1000Hz'$'2000Hz'$'3000Hz'$'4000Hz'$'6000Hz'$'8000Hz'$   Right ear:           Left ear:           Comments: Pt has appt to check hearing with Dr Redmond Baseman  Vision Screening Comments: Pt follows up with Dr Valetta Close at [name] op[facility]  Dietary issues and exercise activities discussed: Current Exercise Habits: Home exercise routine, Type of exercise: walking, Time (Minutes): 30, Frequency (Times/Week): 5, Weekly Exercise (Minutes/Week): 150  Goals    . Exercise 150 minutes per week (moderate activity)     Keep active. May try to water     . Patient Stated     Lose weight      Depression Screen PHQ 2/9 Scores 08/11/2020 07/17/2019 05/22/2019 10/30/2018 10/30/2018 07/11/2018 10/20/2016  PHQ - 2 Score 0 0 0 0 0 0 0  PHQ- 9 Score - - 1 2 - - -    Fall Risk Fall Risk  08/11/2020 11/05/2019 07/17/2019 05/22/2019 07/11/2018  Falls in the past year? 0 0 0 0 No  Comment - - - - -  Number falls in past yr: 0 - - - -  Injury with Fall? 0 - 0 - -  Risk for fall due to : Impaired vision;Impaired balance/gait - - - -  Follow up Falls prevention discussed - Falls evaluation completed;Education provided;Falls prevention discussed - -    Any stairs in or around the home? Yes  If so, are there any without handrails? No  Home free of loose throw rugs in walkways, pet beds, electrical cords, etc? Yes  Adequate lighting in your home to reduce risk of falls? Yes   ASSISTIVE DEVICES UTILIZED TO PREVENT FALLS:  Life alert? No  Use of a cane, walker or w/c? No  Grab bars in the bathroom? Yes  Shower chair or bench in shower? No  Elevated toilet seat or a handicapped toilet?  Yes   TIMED UP AND GO:  Was the test performed? No .      Cognitive Function:     6CIT Screen 08/11/2020 07/17/2019  09/14/2016  What Year? 0 points 0 points 0 points  What month? 0 points 0 points 0 points  What time? - 0 points 0 points  Count back from 20 0 points 0 points 0 points  Months in reverse 0 points 0 points 0 points  Repeat phrase 0 points 0 points 0 points  Total Score - 0 0    Immunizations Immunization History  Administered Date(s) Administered  . Influenza Split 07/14/2012  . Influenza, High Dose Seasonal PF 07/28/2015, 09/14/2016, 10/07/2017, 07/11/2018, 05/31/2019  . Influenza,inj,Quad PF,6+ Mos 07/16/2013, 07/25/2014  . Influenza,inj,quad, With Preservative 07/16/2019  . PFIZER SARS-COV-2 Vaccination 11/19/2019, 12/14/2019  . Pneumococcal Conjugate-13 03/13/2014  . Pneumococcal Polysaccharide-23 07/16/2013  . Tetanus 03/13/2014    TDAP status: Due, Education has been provided regarding the importance of this vaccine. Advised may receive this vaccine at local pharmacy or Health Dept. Aware to provide a copy of the vaccination record if obtained from local pharmacy or Health Dept. Verbalized acceptance and understanding. Flu Vaccine status: Declined, Education has been provided regarding the importance of this vaccine but patient still declined. Advised may receive this vaccine at local pharmacy or Health Dept. Aware to provide a copy of the vaccination record if obtained from local pharmacy or Health Dept. Verbalized acceptance and understanding. Pneumococcal vaccine status: Up to date Covid-19 vaccine status: Completed vaccines  Qualifies for Shingles Vaccine? Yes   Zostavax completed No   Shingrix Completed?: No.    Education has been provided regarding the importance of this vaccine. Patient has been advised to call insurance company to determine out of pocket expense if they have not yet received this vaccine. Advised may also receive vaccine at local pharmacy or Health Dept. Verbalized acceptance and understanding.  Screening Tests Health Maintenance  Topic Date Due  .  HEMOGLOBIN A1C  05/04/2020  . INFLUENZA VACCINE  01/08/2021 (Originally 05/11/2020)  . FOOT EXAM  08/11/2021 (Originally 10/31/2019)  . OPHTHALMOLOGY EXAM  07/04/2021  . TETANUS/TDAP  03/13/2024  . COVID-19 Vaccine  Completed  . PNA vac Low Risk Adult  Completed    Health Maintenance  Health Maintenance Due  Topic Date Due  . HEMOGLOBIN A1C  05/04/2020    Colorectal cancer screening: No longer required.     Additional Screening:   Vision Screening: Recommended annual ophthalmology exams for early detection of glaucoma and other disorders of the eye. Is the patient up to date with their annual eye exam?  Yes  Who is the provider or what is the name of the office in which the patient attends annual eye exams? Dr Valetta Close   Dental Screening: Recommended annual dental exams for proper oral hygiene  Community Resource Referral / Chronic Care Management: CRR required this visit?  No   CCM required this visit?  No      Plan:     I have personally reviewed and noted the following in the patient's chart:   . Medical and social history . Use of alcohol, tobacco or illicit drugs  . Current medications and supplements . Functional ability and status . Nutritional status . Physical activity . Advanced directives . List of other physicians . Hospitalizations, surgeries, and ER visits in previous 12 months . Vitals . Screenings to include cognitive, depression, and falls . Referrals and appointments  In addition, I  have reviewed and discussed with patient certain preventive protocols, quality metrics, and best practice recommendations. A written personalized care plan for preventive services as well as general preventive health recommendations were provided to patient.     Willette Brace, LPN   [id number]   Nurse Notes: None

## 2020-08-11 NOTE — Patient Instructions (Addendum)
[name] , Thank you for taking time to come for your Medicare Wellness Visit. I appreciate your ongoing commitment to your health goals. Please review the following plan we discussed and let me know if I can assist you in the future.   Screening recommendations/referrals: Colonoscopy: No longer required Recommended yearly ophthalmology/optometry visit for glaucoma screening and checkup Recommended yearly dental visit for hygiene and checkup  Vaccinations: Influenza vaccine: Due and disvcuse Pneumococcal vaccine: Up to date Tdap vaccine: Up to date Shingles vaccine: Shingrix, Please contact your pharmacy for coverage information.    Covid-19: Completed 2/8 & 12/14/19  Advanced directives: Please bring a copy of your health care power of attorney and living will to the office at your convenience  Conditions/risks identified: Lose weight   Next appointment: Follow up in one year for your annual wellness visit.   Preventive Care 53 Years and Older, Male Preventive care refers to lifestyle choices and visits with your health care provider that can promote health and wellness. What does preventive care include?  A yearly physical exam. This is also called an annual well check.  Dental exams once or twice a year.  Routine eye exams. Ask your health care provider how often you should have your eyes checked.  Personal lifestyle choices, including:  Daily care of your teeth and gums.  Regular physical activity.  Eating a healthy diet.  Avoiding tobacco and drug use.  Limiting alcohol use.  Practicing safe sex.  Taking low doses of aspirin every day.  Taking vitamin and mineral supplements as recommended by your health care provider. What happens during an annual well check? The services and screenings done by your health care provider during your annual well check will depend on your age, overall health, lifestyle risk factors, and family history of disease. Counseling  Your  health care provider may ask you questions about your:  Alcohol use.  Tobacco use.  Drug use.  Emotional well-being.  Home and relationship well-being.  Sexual activity.  Eating habits.  History of falls.  Memory and ability to understand (cognition).  Work and work Statistician. Screening  You may have the following tests or measurements:  Height, weight, and BMI.  Blood pressure.  Lipid and cholesterol levels. These may be checked every 5 years, or more frequently if you are over 42 years old.  Skin check.  Lung cancer screening. You may have this screening every year starting at age 34 if you have a 30-pack-year history of smoking and currently smoke or have quit within the past 15 years.  Fecal occult blood test (FOBT) of the stool. You may have this test every year starting at age 52.  Flexible sigmoidoscopy or colonoscopy. You may have a sigmoidoscopy every 5 years or a colonoscopy every 10 years starting at age 72.  Prostate cancer screening. Recommendations will vary depending on your family history and other risks.  Hepatitis C blood test.  Hepatitis B blood test.  Sexually transmitted disease (STD) testing.  Diabetes screening. This is done by checking your blood sugar (glucose) after you have not eaten for a while (fasting). You may have this done every 1-3 years.  Abdominal aortic aneurysm (AAA) screening. You may need this if you are a current or former smoker.  Osteoporosis. You may be screened starting at age 90 if you are at high risk. Talk with your health care provider about your test results, treatment options, and if necessary, the need for more tests. Vaccines  Your health  care provider may recommend certain vaccines, such as:  Influenza vaccine. This is recommended every year.  Tetanus, diphtheria, and acellular pertussis (Tdap, Td) vaccine. You may need a Td booster every 10 years.  Zoster vaccine. You may need this after age  81.  Pneumococcal 13-valent conjugate (PCV13) vaccine. One dose is recommended after age 1.  Pneumococcal polysaccharide (PPSV23) vaccine. One dose is recommended after age 45. Talk to your health care provider about which screenings and vaccines you need and how often you need them. This information is not intended to replace advice given to you by your health care provider. Make sure you discuss any questions you have with your health care provider. Document Released: 10/24/2015 Document Revised: 06/16/2016 Document Reviewed: 07/29/2015 Elsevier Interactive Patient Education  2017 [place] Prevention in the Home Falls can cause injuries. They can happen to people of all ages. There are many things you can do to make your home safe and to help prevent falls. What can I do on the outside of my home?  Regularly fix the edges of walkways and driveways and fix any cracks.  Remove anything that might make you trip as you walk through a door, such as a raised step or threshold.  Trim any bushes or trees on the path to your home.  Use bright outdoor lighting.  Clear any walking paths of anything that might make someone trip, such as rocks or tools.  Regularly check to see if handrails are loose or broken. Make sure that both sides of any steps have handrails.  Any raised decks and porches should have guardrails on the edges.  Have any leaves, snow, or ice cleared regularly.  Use sand or salt on walking paths during winter.  Clean up any spills in your garage right away. This includes oil or grease spills. What can I do in the bathroom?  Use night lights.  Install grab bars by the toilet and in the tub and shower. Do not use towel bars as grab bars.  Use non-skid mats or decals in the tub or shower.  If you need to sit down in the shower, use a plastic, non-slip stool.  Keep the floor dry. Clean up any water that spills on the floor as soon as it happens.  Remove  soap buildup in the tub or shower regularly.  Attach bath mats securely with double-sided non-slip rug tape.  Do not have throw rugs and other things on the floor that can make you trip. What can I do in the bedroom?  Use night lights.  Make sure that you have a light by your bed that is easy to reach.  Do not use any sheets or blankets that are too big for your bed. They should not hang down onto the floor.  Have a firm chair that has side arms. You can use this for support while you get dressed.  Do not have throw rugs and other things on the floor that can make you trip. What can I do in the kitchen?  Clean up any spills right away.  Avoid walking on wet floors.  Keep items that you use a lot in easy-to-reach places.  If you need to reach something above you, use a strong step stool that has a grab bar.  Keep electrical cords out of the way.  Do not use floor polish or wax that makes floors slippery. If you must use wax, use non-skid floor wax.  Do not have  throw rugs and other things on the floor that can make you trip. What can I do with my stairs?  Do not leave any items on the stairs.  Make sure that there are handrails on both sides of the stairs and use them. Fix handrails that are broken or loose. Make sure that handrails are as long as the stairways.  Check any carpeting to make sure that it is firmly attached to the stairs. Fix any carpet that is loose or worn.  Avoid having throw rugs at the top or bottom of the stairs. If you do have throw rugs, attach them to the floor with carpet tape.  Make sure that you have a light switch at the top of the stairs and the bottom of the stairs. If you do not have them, ask someone to add them for you. What else can I do to help prevent falls?  Wear shoes that:  Do not have high heels.  Have rubber bottoms.  Are comfortable and fit you well.  Are closed at the toe. Do not wear sandals.  If you use a  stepladder:  Make sure that it is fully opened. Do not climb a closed stepladder.  Make sure that both sides of the stepladder are locked into place.  Ask someone to hold it for you, if possible.  Clearly mark and make sure that you can see:  Any grab bars or handrails.  First and last steps.  Where the edge of each step is.  Use tools that help you move around (mobility aids) if they are needed. These include:  Canes.  Walkers.  Scooters.  Crutches.  Turn on the lights when you go into a dark area. Replace any light bulbs as soon as they burn out.  Set up your furniture so you have a clear path. Avoid moving your furniture around.  If any of your floors are uneven, fix them.  If there are any pets around you, be aware of where they are.  Review your medicines with your doctor. Some medicines can make you feel dizzy. This can increase your chance of falling. Ask your doctor what other things that you can do to help prevent falls. This information is not intended to replace advice given to you by your health care provider. Make sure you discuss any questions you have with your health care provider. Document Released: 07/24/2009 Document Revised: 03/04/2016 Document Reviewed: 11/01/2014 Elsevier Interactive Patient Education  2017 [employer].

## 2020-08-12 ENCOUNTER — Ambulatory Visit (HOSPITAL_COMMUNITY): Payer: Medicare HMO | Admitting: Nurse Practitioner

## 2020-08-19 DIAGNOSIS — H9313 Tinnitus, bilateral: Secondary | ICD-10-CM | POA: Diagnosis not present

## 2020-08-19 DIAGNOSIS — H903 Sensorineural hearing loss, bilateral: Secondary | ICD-10-CM | POA: Diagnosis not present

## 2020-08-19 DIAGNOSIS — H90A22 Sensorineural hearing loss, unilateral, left ear, with restricted hearing on the contralateral side: Secondary | ICD-10-CM | POA: Diagnosis not present

## 2020-08-20 ENCOUNTER — Other Ambulatory Visit: Payer: Self-pay | Admitting: Otolaryngology

## 2020-08-20 DIAGNOSIS — H90A22 Sensorineural hearing loss, unilateral, left ear, with restricted hearing on the contralateral side: Secondary | ICD-10-CM

## 2020-08-21 ENCOUNTER — Other Ambulatory Visit: Payer: Self-pay

## 2020-08-21 ENCOUNTER — Ambulatory Visit
Admission: RE | Admit: 2020-08-21 | Discharge: 2020-08-21 | Disposition: A | Discharge status: Home / Self Care | Payer: Medicare HMO | Source: Ambulatory Visit | Attending: Otolaryngology | Admitting: Otolaryngology

## 2020-08-21 DIAGNOSIS — H90A22 Sensorineural hearing loss, unilateral, left ear, with restricted hearing on the contralateral side: Secondary | ICD-10-CM

## 2020-08-21 DIAGNOSIS — H9042 Sensorineural hearing loss, unilateral, left ear, with unrestricted hearing on the contralateral side: Secondary | ICD-10-CM | POA: Diagnosis not present

## 2020-08-21 MED ORDER — GADOBENATE DIMEGLUMINE 529 MG/ML IV SOLN
20.0000 mL | Freq: Once | INTRAVENOUS | Status: AC | PRN
  Administered 2020-08-21: 20 mL via INTRAVENOUS

## 2020-08-24 ENCOUNTER — Ambulatory Visit: Payer: Medicare HMO

## 2020-08-24 DIAGNOSIS — R0683 Snoring: Secondary | ICD-10-CM

## 2020-08-24 NOTE — Procedures (Signed)
   Sleep Study Report  Patient Information Name: Brent Taylor  ID: [id number] Birth Date: 03-Oct-1941  Age: 79  Gender: Male BMI: 33.4 (W=247 lb, H=6' 0'') Study Date:08/05/2020 Referring Physician: Thompson Grayer, MD  TEST DESCRIPTION: Home sleep apnea testing was completed using the WatchPat, a Type 1 device, utilizing peripheral arterial tonometry (PAT), chest movement, actigraphy, pulse oximetry, pulse rate, body position and snore. AHI was calculated with apnea and hypopnea using valid sleep time as the denominator. RDI includes apneas, hypopneas, and RERAs. The data acquired and the scoring of sleep and all associated events were performed in accordance with the recommended standards and specifications as outlined in the AASM Manual for the Scoring of Sleep and Associated Events 2.2.0 (2015).  FINDINGS: 1. Mild Obstructive Sleep Apnea with AHI 14.1/hr.  2. Minimal Central Sleep Apnea with pAHIc 0.2/hr. 3. Oxygen desaturations as low as 83%. 4. Moderate snoring was present. O2 sats were < 88% for 0.70minutes. 5. Total sleep time was 8 hrs and 49 min. 6. 21.6% of total sleep time was spent in REM sleep.  7. Short sleep onset latency at 8 min.  8. Prolonged REM sleep onset latency at 91 min.  9. Total awakenings were 11.   DIAGNOSIS:  Mild Obstructive Sleep Apnea (G47.33)  Recommendations 1. Clinical correlation of these findings is necessary. The decision to treat obstructive sleep apnea (OSA) is usually based on the presence of apnea symptoms or the presence of associated medical conditions such as Hypertension, Congestive Heart Failure, Atrial Fibrillation or Obesity. The most common symptoms of OSA are snoring, gasping for breath while sleeping, daytime sleepiness and fatigue.   2. Initiating apnea therapy is recommended given the presence of symptoms and/or associated conditions.   Recommend proceeding with one of the following:  a. Auto-CPAP therapy with a pressure range of 5-20cm  H2O.   b. An oral appliance (OA) that can be obtained from certain dentists with expertise in sleep medicine. These are primarily of use in non-obese patients with mild and moderate disease.   c. An ENT consultation which may be useful to look for specific causes of obstruction and possible treatment options.   d. If patient is intolerant to PAP therapy, consider referral to ENT for evaluation for hypoglossal nerve stimulator.   3. Close follow-up is necessary to ensure success with CPAP or oral appliance therapy for maximum benefit .  4. A follow-up oximetry study on CPAP is recommended to assess the adequacy of therapy and determine the need for supplemental oxygen or the potential need for Bi-level therapy. An arterial blood gas to determine the adequacy of baseline ventilation and oxygenation should also be considered.  5. Healthy sleep recommendations include: adequate nightly sleep (normal 7-9 hrs/night), avoidance of caffeine after noon and alcohol near bedtime, and maintaining a sleep environment that is cool, dark and quiet.  6. Weight loss for overweight patients is recommended. Even modest amounts of weight loss can significantly improve the severity of sleep apnea.  7. Snoring recommendations include: weight loss where appropriate, side sleeping, and avoidance of alcohol before bed.  8. Operation of motor vehicle or dangerous equipment must be avoided when feeling drowsy, excessively sleepy, or mentally fatigued.  Report prepared by:  Signature: Fransico Him, MD [facility], Diplomate ABSM  Electronically Signed: Aug 24, 2020

## 2020-08-26 ENCOUNTER — Telehealth: Payer: Self-pay | Admitting: *Deleted

## 2020-08-26 NOTE — Telephone Encounter (Signed)
Informed patient of sleep study results and patient understanding was verbalized. Patient understands his sleep study showed they have sleep apnea and recommend auto CPAP titration through DME Orders have been placed in Epic. Please set 8 week OV with me.   Upon patient request DME selection is [facility]. Patient understands she/he will be contacted by [facility] to set up her/he cpap. Patient understands to call if [facility] does not contact her/he with new setup in a timely manner. Patient understands they will be called once confirmation has been received from [facility] that they have received their new machine to schedule 10 week follow up appointment.   [facility] notified of new cpap order  Please add to airview PER DPR Left detailed message on voicemail and informed patient to call back with questions

## 2020-08-26 NOTE — Telephone Encounter (Signed)
-----   Message from Sueanne Margarita, MD sent at 08/24/2020  3:37 PM EST ----- Please let patient know that they have sleep apnea and recommend auto CPAP titration through DME  Orders have been placed in Epic. Please set 8 week OV with me.

## 2020-09-16 ENCOUNTER — Encounter (HOSPITAL_COMMUNITY): Payer: Self-pay

## 2020-09-26 ENCOUNTER — Telehealth: Payer: Self-pay | Admitting: Family Medicine

## 2020-09-26 NOTE — Chronic Care Management (AMB) (Signed)
  Chronic Care Management   Note  09/26/2020 Name: Brent Taylor MRN: [id number] DOB: Feb 11, 1941  Brent Taylor is a 79 y.o. year old male who is a primary care patient of [name], Algis Greenhouse, MD. I reached out to [employer] by phone today in response to a referral sent by Brent Taylor's PCP, [name], MD.   [name] was given information about Chronic Care Management services today including:  1. CCM service includes personalized support from designated clinical staff supervised by his physician, including individualized plan of care and coordination with other care providers 2. 24/7 contact phone numbers for assistance for urgent and routine care needs. 3. Service will only be billed when office clinical staff spend 20 minutes or more in a month to coordinate care. 4. Only one practitioner may furnish and bill the service in a calendar month. 5. The patient may stop CCM services at any time (effective at the end of the month) by phone call to the office staff.   Patient agreed to services and verbal consent obtained.   Follow up plan:   Lauretta Grill [employer] Scheduler

## 2020-10-06 ENCOUNTER — Encounter: Payer: Self-pay | Admitting: Family Medicine

## 2020-10-08 ENCOUNTER — Encounter: Payer: Self-pay | Admitting: Family Medicine

## 2020-10-09 ENCOUNTER — Encounter (HOSPITAL_COMMUNITY): Payer: Self-pay | Admitting: Nurse Practitioner

## 2020-10-09 ENCOUNTER — Ambulatory Visit (HOSPITAL_COMMUNITY)
Admission: RE | Admit: 2020-10-09 | Discharge: 2020-10-09 | Disposition: A | Discharge status: Home / Self Care | Payer: Medicare HMO | Source: Ambulatory Visit | Attending: Nurse Practitioner | Admitting: Nurse Practitioner

## 2020-10-09 ENCOUNTER — Other Ambulatory Visit: Payer: Self-pay

## 2020-10-09 VITALS — BP 168/94 | HR 76 | Ht 72.0 in | Wt 243.6 lb

## 2020-10-09 DIAGNOSIS — Z7901 Long term (current) use of anticoagulants: Secondary | ICD-10-CM | POA: Insufficient documentation

## 2020-10-09 DIAGNOSIS — Z79899 Other long term (current) drug therapy: Secondary | ICD-10-CM | POA: Insufficient documentation

## 2020-10-09 DIAGNOSIS — D6869 Other thrombophilia: Secondary | ICD-10-CM | POA: Diagnosis not present

## 2020-10-09 DIAGNOSIS — I480 Paroxysmal atrial fibrillation: Secondary | ICD-10-CM | POA: Insufficient documentation

## 2020-10-09 DIAGNOSIS — I10 Essential (primary) hypertension: Secondary | ICD-10-CM | POA: Insufficient documentation

## 2020-10-09 DIAGNOSIS — I4891 Unspecified atrial fibrillation: Secondary | ICD-10-CM

## 2020-10-09 NOTE — Progress Notes (Signed)
Primary Care Physician: [name], MD Referring Physician: Dr. Rayann Heman EP: Dr. Macario Golds Maher is a 79 y.o. male with a h/o of new dx of paroxysmal afib while on vacation in [place]. He developed dizziness, nausea/clamminess and when it persisted he went to the ER there and was found in afib. [name] He was kept a couple of days. He saw Dr. Rayann Heman on return from there. He had been placed on daily Cardizem along with his previous BB. Since he just had the one episode, he advised to stop it and use 180 Cardizem as needed. He was also placed on eliquis 5 mg bid for a CHA2DS2VASc score of 6. No alcohol or tobacco or excessive caffeine. He does snore, is pending a home study but states he was screened overnight in the hospital for sleep apnea and was told no issues observed.    He has since called here since seeing Dr. Rayann Heman reporting afib. Once he took the 180 mg cardizem, the other time the episode passed in a few hours.   He walked into the afib waiting room, wanting to be seen as he said Dr. Rayann Heman told him to come over anytime he needed attention.He woke this am feeling nauseated, dizzy. Clammy.[name] He took his BP without his am meds and had a systolic BP around 715 systolic. He ran a strip off his watch and it showed sinus brady. He took his am pills  with the extra 180 mg Cardizem and left for here. He assumed since he felt similar with his afib episode in [place], it was related.   EKG shows sinus brady at 52 bpm. His BP is 156/86. He is now feeling better. Review of his Apple strips show some afib, some SR with PAC's and some sinus bradycardia, some that appear to be junctional, but often has artifact, so difficult  to interpret. SR this am.  F/u in afib clinic 10/09/20. He feels well, no further dizzy spells. He saw Dr. Rayann Heman last, decreased BB and he wanted echo scheduled with pt  on this visit. No bleeding issues. Remains on eliquis 5 mg bid for a CHA2DS2VASc score of 6.   Today, he  denies symptoms of palpitations, chest pain, shortness of breath, orthopnea, PND, lower extremity edema, dizziness, presyncope, syncope, or neurologic sequela. The patient is tolerating medications without difficulties and is otherwise without complaint today.   Past Medical History:  Diagnosis Date  . Cancer (HCC)    hx - melanoma  . Chicken pox   . Colon polyps   . Depression    No need for therapy.  Improved  . Diabetes ([place])   . Diverticula, colon   . Hemorrhoids   . Hyperlipidemia   . Hypertension   . Sepsis ([place])   . Tinnitus   . UTI (lower urinary tract infection)    Past Surgical History:  Procedure Laterality Date  . MELANOMA EXCISION     LEFT CHEST  . MELANOMA EXCISION      Current Outpatient Medications  Medication Sig Dispense Refill  . apixaban (ELIQUIS) 5 MG TABS tablet Take 1 tablet (5 mg total) by mouth 2 (two) times daily. 60 tablet 5  . Blood Glucose Monitoring Suppl (ONE TOUCH ULTRA 2) w/Device KIT Use to check blood sugar 3-4 times daily 1 each 0  . diltiazem (CARDIZEM) 30 MG tablet Take 1 tablet every 4 hours AS NEEDED for afib heart rate >100 as long as top blood pressure >100. 45  tablet 1  . FLUoxetine (PROZAC) 20 MG tablet Take 20 mg by mouth daily.    [name] glucose blood test strip Use to test blood sugars daily. Dx: E11.9 100 strip 3  . ibuprofen (ADVIL,MOTRIN) 200 MG tablet Take 600 mg by mouth as needed for moderate pain.    . Lancets (ONETOUCH ULTRASOFT) lancets Use to test blood sugars daily. Dx: E11.9 100 each 12  . metoprolol tartrate (LOPRESSOR) 25 MG tablet Take 0.5 tablets (12.5 mg total) by mouth 2 (two) times daily. 180 tablet 3  . pantoprazole (PROTONIX) 20 MG tablet TAKE ONE TABLET BY MOUTH DAILY 90 tablet 3  . pravastatin (PRAVACHOL) 40 MG tablet Take 40 mg by mouth daily.    . quinapril (ACCUPRIL) 20 MG tablet Take 1 tablet (20 mg total) by mouth at bedtime. 30 tablet 5  . sildenafil (VIAGRA) 100 MG tablet Take 1 tablet (100 mg total) by  mouth daily as needed for erectile dysfunction. 10 tablet 0  . FLUoxetine (PROZAC) 40 MG capsule Take 20 mg by mouth daily. (Patient not taking: Reported on 10/09/2020)     No current facility-administered medications for this encounter.    Allergies  Allergen Reactions  . Lipitor [Atorvastatin Calcium]     Muscle ache    Social History   Socioeconomic History  . Marital status: Married    Spouse name: Not on file  . Number of children: 2  . Years of education: Not on file  . Highest education level: Not on file  Occupational History  . Occupation: Company secretary  . Occupation: retired  Tobacco Use  . Smoking status: Never Smoker  . Smokeless tobacco: Never Used  Substance and Sexual Activity  . Alcohol use: No  . Drug use: No  . Sexual activity: Not on file  Other Topics Concern  . Not on file  Social History Narrative   Lives with wife in [place].   Retired [employer]   Attends [place] Strain: [place] [employer]   . Difficulty of Paying Living Expenses: Not hard at all  Food Insecurity: No Food Insecurity  . Worried About Charity fundraiser in the Last Year: Never true  . Ran Out of Food in the Last Year: Never true  Transportation Needs: No Transportation Needs  . Lack of Transportation (Medical): No  . Lack of Transportation (Non-Medical): No  Physical Activity: Sufficiently Active  . Days of Exercise per Week: 5 days  . Minutes of Exercise per Session: 30 min  Stress: No Stress Concern Present  . Feeling of Stress : Not at all  Social Connections: Moderately Integrated  . Frequency of Communication with Friends and Family: More than three times a week  . Frequency of Social Gatherings with Friends and Family: More than three times a week  . Attends Religious Services: More than 4 times per year  . Active Member of Clubs or Organizations: No  . Attends Archivist Meetings: Never  .  Marital Status: Married  Human resources officer Violence: Not At Risk  . Fear of Current or Ex-Partner: No  . Emotionally Abused: No  . Physically Abused: No  . Sexually Abused: No    Family History  Problem Relation Age of Onset  . Heart Problems Mother        Skipping    ROS- All systems are reviewed and negative except as per the HPI above  Physical Exam:  Vitals:   10/09/20 1414  BP: (!) 168/94  Pulse: 76  Weight: 110.5 kg  Height: 6' (1.829 m)   Wt Readings from Last 3 Encounters:  10/09/20 110.5 kg  07/28/20 111.9 kg  06/25/20 112.5 kg    Labs: Lab Results  Component Value Date   NA 137 02/19/2020   K 4.5 02/19/2020   CL 104 02/19/2020   CO2 25 02/19/2020   GLUCOSE 105 (H) 02/19/2020   BUN 23 02/19/2020   CREATININE 1.20 02/19/2020   CALCIUM 9.3 02/19/2020   Lab Results  Component Value Date   INR 1.26 11/10/2015   Lab Results  Component Value Date   CHOL 162 11/05/2019   HDL 34.80 (L) 11/05/2019   LDLCALC 87 11/05/2019   TRIG 197.0 (H) 11/05/2019     GEN- The patient is well appearing, alert and oriented x 3 today.   Head- normocephalic, atraumatic Eyes-  Sclera clear, conjunctiva pink Ears- hearing intact Oropharynx- clear Neck- supple, no JVP Lymph- no cervical lymphadenopathy Lungs- Clear to ausculation bilaterally, normal work of breathing Heart- slow regular rate and rhythm, no murmurs, rubs or gallops, PMI not laterally displaced GI- soft, NT, ND, + BS Extremities- no clubbing, cyanosis, or edema MS- no significant deformity or atrophy Skin- no rash or lesion Psych- euthymic mood, full affect Neuro- strength and sensation are intact  EKG-sinus brady at 76 bpm, with PAC's, qrs int 102 ms, qtc 452 ms      Assessment and Plan: 1.  Afib   in SR with pac's Feels well Echo scheduled  Continue  daily metoprolol Has rx for cardizem 30 mg to use  if in afib AND afib has a v response over 100 bpm   2. HTN  Elevated in office States at  home better controlled  Recheck later today   3. CHA2DS2VASc score of 6 Continue eliquis 5 mg bid   I will see back in 4 months   Butch Penny C. Chancey Ringel, [facility] [address] [place],  [address] [phone]

## 2020-10-13 ENCOUNTER — Other Ambulatory Visit: Payer: Self-pay

## 2020-10-13 ENCOUNTER — Encounter: Payer: Self-pay | Admitting: Family Medicine

## 2020-10-13 ENCOUNTER — Ambulatory Visit (INDEPENDENT_AMBULATORY_CARE_PROVIDER_SITE_OTHER): Payer: Medicare Other | Admitting: Family Medicine

## 2020-10-13 VITALS — BP 142/81 | HR 60 | Temp 97.6°F | Ht 72.0 in | Wt 245.4 lb

## 2020-10-13 DIAGNOSIS — I4891 Unspecified atrial fibrillation: Secondary | ICD-10-CM

## 2020-10-13 DIAGNOSIS — E1159 Type 2 diabetes mellitus with other circulatory complications: Secondary | ICD-10-CM | POA: Diagnosis not present

## 2020-10-13 DIAGNOSIS — F325 Major depressive disorder, single episode, in full remission: Secondary | ICD-10-CM

## 2020-10-13 LAB — POCT GLYCOSYLATED HEMOGLOBIN (HGB A1C): Hemoglobin A1C: 6.2 % — AB (ref 4.0–5.6)

## 2020-10-13 MED ORDER — APIXABAN 5 MG PO TABS
5.0000 mg | ORAL_TABLET | Freq: Two times a day (BID) | ORAL | 3 refills | Status: DC
Start: 2020-10-13 — End: 2021-01-06

## 2020-10-13 MED ORDER — ONETOUCH ULTRA 2 W/DEVICE KIT
PACK | 0 refills | Status: DC
Start: 2020-10-13 — End: 2024-05-08

## 2020-10-13 MED ORDER — FLUOXETINE HCL 40 MG PO CAPS
40.0000 mg | ORAL_CAPSULE | Freq: Every day | ORAL | 3 refills | Status: DC
Start: 2020-10-13 — End: 2021-04-15

## 2020-10-13 NOTE — Assessment & Plan Note (Signed)
Regular rate and rhythm today.  Continue diltiazem 30mg  as needed and Eliquis 5 mg twice daily.  Refill sent in on Eliquis today.

## 2020-10-13 NOTE — Patient Instructions (Signed)
It was very nice to see you today!  Your A1c today is 6.2.  Your home glucometer is not accurate.  I will send a prescription for you to get a new one.  I will refill your medications today.  No other changes today.  I will see back in a few weeks for your physical.  Please come back to see me sooner if needed.  Take care, Dr Jimmey Ralph  Please try these tips to maintain a healthy lifestyle:   Eat at least 3 REAL meals and 1-2 snacks per day.  Aim for no more than 5 hours between eating.  If you eat breakfast, please do so within one hour of getting up.    Each meal should contain half fruits/vegetables, one quarter protein, and one quarter carbs (no bigger than a computer mouse)   Cut down on sweet beverages. This includes juice, soda, and sweet tea.     Drink at least 1 glass of water with each meal and aim for at least 8 glasses per day   Exercise at least 150 minutes every week.

## 2020-10-13 NOTE — Assessment & Plan Note (Signed)
A1c 6.2.  Advised patient that his home readings in the 400s are likely not accurate.  Will give prescription for him to get a new meter.  We will come back in a few weeks for CPE.

## 2020-10-13 NOTE — Progress Notes (Signed)
   Brent Taylor is a 80 y.o. male who presents today for an office visit.  Assessment/Plan:  Chronic Problems Addressed Today: Major depression in remission (HCC) Stable.  Continue Prozac 40 mg daily.  Refill sent in today.  Atrial fibrillation (HCC) Regular rate and rhythm today.  Continue diltiazem 30mg  as needed and Eliquis 5 mg twice daily.  Refill sent in on Eliquis today.  Type 2 diabetes mellitus with vascular disease (HCC) A1c 6.2.  Advised patient that his home readings in the 400s are likely not accurate.  Will give prescription for him to get a new meter.  We will come back in a few weeks for CPE.     Subjective:  HPI:  See A/p.         Objective:  Physical Exam: BP (!) 142/81   Pulse 60   Temp 97.6 F (36.4 C) (Temporal)   Ht 6' (1.829 m)   Wt 245 lb 6.4 oz (111.3 kg)   SpO2 94%   BMI 33.28 kg/m   Gen: No acute distress, resting comfortably CV: Regular rate and rhythm with no murmurs appreciated Pulm: Normal work of breathing, clear to auscultation bilaterally with no crackles, wheezes, or rhonchi Neuro: Grossly normal, moves all extremities Psych: Normal affect and thought content      Brent Taylor M. , MD 10/13/2020 8:33 AM

## 2020-10-13 NOTE — Assessment & Plan Note (Signed)
Stable.  Continue Prozac 40 mg daily.  Refill sent in today.

## 2020-10-15 ENCOUNTER — Other Ambulatory Visit: Payer: Self-pay

## 2020-10-15 ENCOUNTER — Ambulatory Visit (HOSPITAL_COMMUNITY)
Admission: RE | Admit: 2020-10-15 | Discharge: 2020-10-15 | Disposition: A | Discharge status: Home / Self Care | Payer: Medicare Other | Source: Ambulatory Visit | Attending: Nurse Practitioner | Admitting: Nurse Practitioner

## 2020-10-15 DIAGNOSIS — I119 Hypertensive heart disease without heart failure: Secondary | ICD-10-CM | POA: Diagnosis not present

## 2020-10-15 DIAGNOSIS — E119 Type 2 diabetes mellitus without complications: Secondary | ICD-10-CM | POA: Diagnosis not present

## 2020-10-15 DIAGNOSIS — I361 Nonrheumatic tricuspid (valve) insufficiency: Secondary | ICD-10-CM | POA: Diagnosis not present

## 2020-10-15 DIAGNOSIS — I2510 Atherosclerotic heart disease of native coronary artery without angina pectoris: Secondary | ICD-10-CM | POA: Diagnosis not present

## 2020-10-15 DIAGNOSIS — I4891 Unspecified atrial fibrillation: Secondary | ICD-10-CM | POA: Diagnosis not present

## 2020-10-15 DIAGNOSIS — E785 Hyperlipidemia, unspecified: Secondary | ICD-10-CM | POA: Diagnosis not present

## 2020-10-15 LAB — ECHOCARDIOGRAM COMPLETE: S' Lateral: 3 cm

## 2020-10-15 NOTE — Progress Notes (Signed)
  Echocardiogram 2D Echocardiogram has been performed.  Delcie Roch 10/15/2020, 3:46 PM

## 2020-10-16 ENCOUNTER — Other Ambulatory Visit: Payer: Self-pay | Admitting: *Deleted

## 2020-10-16 ENCOUNTER — Encounter: Payer: Self-pay | Admitting: Family Medicine

## 2020-10-16 MED ORDER — GLUCOSE BLOOD VI STRP: ORAL_STRIP | 3 refills | Status: DC

## 2020-10-16 NOTE — Telephone Encounter (Signed)
Pt aware strip order, [facility] has refill till 07/2021

## 2020-10-17 ENCOUNTER — Encounter (HOSPITAL_COMMUNITY): Payer: Self-pay | Admitting: *Deleted

## 2020-10-22 ENCOUNTER — Other Ambulatory Visit: Payer: Self-pay | Admitting: *Deleted

## 2020-10-22 DIAGNOSIS — I152 Hypertension secondary to endocrine disorders: Secondary | ICD-10-CM

## 2020-10-22 DIAGNOSIS — E1159 Type 2 diabetes mellitus with other circulatory complications: Secondary | ICD-10-CM

## 2020-10-23 ENCOUNTER — Telehealth: Payer: Self-pay

## 2020-10-23 NOTE — Chronic Care Management (AMB) (Signed)
Chronic Care Management Pharmacy Assistant   Name: Brent Taylor  MRN: [id number] DOB: 06-26-1941  Reason for Encounter: Medication Review/Initial Call    Brent Taylor,  80 y.o. , male presents for their Initial CCM visit with the clinical pharmacist In office.  PCP : [name], MD  Allergies:   Allergies  Allergen Reactions  . Lipitor [Atorvastatin Calcium]     Muscle ache    Medications: Outpatient Encounter Medications as of 10/23/2020  Medication Sig  . apixaban (ELIQUIS) 5 MG TABS tablet Take 1 tablet (5 mg total) by mouth 2 (two) times daily.  . Blood Glucose Monitoring Suppl (ONE TOUCH ULTRA 2) w/Device KIT Use daily to check blood sugar.  . diltiazem (CARDIZEM) 30 MG tablet Take 1 tablet every 4 hours AS NEEDED for afib heart rate >100 as long as top blood pressure >100.  [name] FLUoxetine (PROZAC) 40 MG capsule Take 1 capsule (40 mg total) by mouth daily.  [name] glucose blood test strip Use to test blood sugars daily. Dx: E11.9  . ibuprofen (ADVIL,MOTRIN) 200 MG tablet Take 600 mg by mouth as needed for moderate pain.  . Lancets (ONETOUCH ULTRASOFT) lancets Use to test blood sugars daily. Dx: E11.9  . metoprolol tartrate (LOPRESSOR) 25 MG tablet Take 0.5 tablets (12.5 mg total) by mouth 2 (two) times daily.  . pantoprazole (PROTONIX) 20 MG tablet TAKE ONE TABLET BY MOUTH DAILY  . pravastatin (PRAVACHOL) 40 MG tablet Take 40 mg by mouth daily.  . quinapril (ACCUPRIL) 20 MG tablet Take 1 tablet (20 mg total) by mouth at bedtime.  . sildenafil (VIAGRA) 100 MG tablet Take 1 tablet (100 mg total) by mouth daily as needed for erectile dysfunction.   No facility-administered encounter medications on file as of 10/23/2020.    Current Diagnosis: Patient Active Problem List   Diagnosis Date Noted  . Atrial fibrillation ([place]) 06/10/2020  . Type 2 diabetes mellitus with vascular disease ([place]) 10/20/2016  . Hypertension associated with diabetes ([place]) 11/20/2015  . Chronic combined  systolic and diastolic heart failure ([place])   . Coronary artery disease 12/10/2011  . Dyslipidemia associated with type 2 diabetes mellitus ([place]) 12/10/2011  . Major depression in remission ([place]) 12/10/2011  . GERD (gastroesophageal reflux disease) 12/10/2011  . Hx of colonic polyps 12/10/2011  . Melanoma ([place]) 12/10/2011    Have you seen any other providers since your last visit?   Patient states he recently went to the A-fib clinic on 10/15/2020 and had an echocardiogram.  Any changes in your medications or health?  Patient states he has not had any changes to his medications or health.  Any side effects from any medications?   Patient denies having any side effects from any of his medications at this time.  Do you have an symptoms or problems not managed by your medications?  Patient states he does not have any symptoms or problems not managed by his medications at this time.  Any concerns about your health right now?  Patient states his only concern at this time would be is that he is not able to get around like he would like.  Has your provider asked that you check blood pressure, blood sugar, or follow special diet at home?  Patient states he checks his blood glucose every morning. Patient states he averages about 110,. Patient states does not check blood pressures at home at this time. Patient states he does not follow a special diet. Patient states he and his  wife use intermittent fasting as a way of dieting and trying to lose weight.  Do you get any type of exercise on a regular basis?  Patient states he has not been exercising since the start of the pandemic. Patient states he used to go to [employer]. Patient states he is a very active person and is "always on the go".  Can you think of a goal you would like to reach for your health?  Patient states his goal would be to lose about 15 pounds.  Do you have any problems getting your medications?  Patient states his pharmacy  [facility] [address]) runs out of his medications often and will have to wait a few days for the pharmacy to order them. Patient states this is concerning for him as it's always the same medications that he feels are important to take and not miss any doses. Patient states it's always Quinapril or Eliquis.  Is there anything that you would like to discuss during the appointment?   Patient states he would like to discuss his medications.  Please bring medications and supplements to appointment.  April D Calhoun, [place] Pharmacist Assistant [phone]   Follow-Up:  Pharmacist Review

## 2020-10-27 ENCOUNTER — Telehealth: Payer: Self-pay

## 2020-10-27 NOTE — Telephone Encounter (Signed)
  Chronic Care Management   Outreach Note   Name: Brent Taylor MRN: [id number] DOB: [birth year]  Referred by: [name], MD Reason for referral: Telephone Appointment with [facility] Pharmacist, Madelin Rear.   LVM asking if pt would be ok with telephone visit at [place] rather than originally scheduled in person at [place]. If this is not possible will need to rescheduled due to weather related delays. Call back number is [phone].  Madelin Rear, Pharm.D., BCGP Clinical Pharmacist [facility] [phone]

## 2020-10-28 ENCOUNTER — Ambulatory Visit: Payer: Medicare HMO

## 2020-10-28 ENCOUNTER — Telehealth: Payer: Self-pay | Admitting: Family Medicine

## 2020-10-28 NOTE — Chronic Care Management (AMB) (Signed)
  Chronic Care Management   Outreach Note  10/28/2020 Name: Brent Taylor MRN: [id number] DOB: [birth year]  Referred by: [name], MD Reason for referral : No chief complaint on file.   An unsuccessful telephone outreach was attempted today. The patient was referred to the pharmacist for assistance with care management and care coordination.   Follow Up Plan:   Lauretta Grill [facility] Scheduler

## 2020-10-31 ENCOUNTER — Encounter: Payer: Self-pay | Admitting: Family Medicine

## 2020-11-05 ENCOUNTER — Encounter: Payer: Medicare HMO | Admitting: Family Medicine

## 2020-11-13 DIAGNOSIS — M9901 Segmental and somatic dysfunction of cervical region: Secondary | ICD-10-CM | POA: Diagnosis not present

## 2020-11-13 DIAGNOSIS — M9903 Segmental and somatic dysfunction of lumbar region: Secondary | ICD-10-CM | POA: Diagnosis not present

## 2020-11-13 DIAGNOSIS — M5430 Sciatica, unspecified side: Secondary | ICD-10-CM | POA: Diagnosis not present

## 2020-11-13 DIAGNOSIS — M5440 Lumbago with sciatica, unspecified side: Secondary | ICD-10-CM | POA: Diagnosis not present

## 2020-11-14 ENCOUNTER — Encounter: Payer: Self-pay | Admitting: Family Medicine

## 2020-11-14 ENCOUNTER — Other Ambulatory Visit: Payer: Self-pay

## 2020-11-14 MED ORDER — PANTOPRAZOLE SODIUM 20 MG PO TBEC
20.0000 mg | DELAYED_RELEASE_TABLET | Freq: Every day | ORAL | 3 refills | Status: DC
Start: 2020-11-14 — End: 2021-04-15

## 2020-11-17 DIAGNOSIS — L570 Actinic keratosis: Secondary | ICD-10-CM | POA: Diagnosis not present

## 2020-11-17 DIAGNOSIS — Z85820 Personal history of malignant melanoma of skin: Secondary | ICD-10-CM | POA: Diagnosis not present

## 2020-11-17 DIAGNOSIS — D224 Melanocytic nevi of scalp and neck: Secondary | ICD-10-CM | POA: Diagnosis not present

## 2020-11-17 DIAGNOSIS — L821 Other seborrheic keratosis: Secondary | ICD-10-CM | POA: Diagnosis not present

## 2020-11-17 DIAGNOSIS — Z85828 Personal history of other malignant neoplasm of skin: Secondary | ICD-10-CM | POA: Diagnosis not present

## 2020-11-17 DIAGNOSIS — D1801 Hemangioma of skin and subcutaneous tissue: Secondary | ICD-10-CM | POA: Diagnosis not present

## 2020-11-25 ENCOUNTER — Telehealth: Payer: Self-pay | Admitting: *Deleted

## 2020-11-25 NOTE — Chronic Care Management (AMB) (Signed)
  Chronic Care Management   Note  11/25/2020 Name: Brent Taylor MRN: [id number] DOB: 1941/08/02  Brent Taylor is a 80 y.o. year old male who is a primary care patient of [name], Algis Greenhouse, MD. I reached out to [employer] by phone today in response to a referral sent by Brent Taylor PCP, [name], MD.  Brent Taylor was given information about Chronic Care Management services today including:  1. CCM service includes personalized support from designated clinical staff supervised by his physician, including individualized plan of care and coordination with other care providers 2. 24/7 contact phone numbers for assistance for urgent and routine care needs. 3. Service will only be billed when office clinical staff spend 20 minutes or more in a month to coordinate care. 4. Only one practitioner may furnish and bill the service in a calendar month. 5. The patient may stop CCM services at any time (effective at the end of the month) by phone call to the office staff. 6. The patient will be responsible for cost sharing (co-pay) of up to 20% of the service fee (after annual deductible is met).  Patient agreed to services and verbal consent obtained.   Follow up plan: Telephone appointment with care management team member scheduled for:11/27/2020  [place], [place] Management  Direct Dial [phone]

## 2020-11-27 ENCOUNTER — Telehealth: Payer: Self-pay | Admitting: *Deleted

## 2020-11-27 ENCOUNTER — Telehealth: Payer: Medicare Other

## 2020-11-27 NOTE — Telephone Encounter (Signed)
  Chronic Care Management   Outreach Note  11/27/2020 Name: Brent Taylor MRN: [id number] DOB: [birth year]  Referred by: [name], MD Reason for referral : Chronic Care Management (CHF, HTN, DM)   An unsuccessful telephone outreach was attempted today. The patient was referred to the case management team for assistance with care management and care coordination.     Follow Up Plan: RNCM will request Care Guide assistance to reschedule telephone outreach within the next 10 business days.  Hubert Azure RN, MSN RN Care Management Coordinator  [facility] [phone] [email or web address]

## 2020-11-27 NOTE — Chronic Care Management (AMB) (Signed)
  Care Management   Note  11/27/2020 Name: Brent Taylor MRN: [id number] DOB: [birth year]  Brent Taylor is a 80 y.o. year old male who is a primary care patient of [name], MD and is actively engaged with the care management team. I reached out to [employer] by phone today to assist with returning a call to answer questions about Chronic Care Management TEAM.    Follow up plan: Unsuccessful telephone outreach attempt made. A HIPAA compliant phone message was left for the patient providing contact information and requesting a return call. If patient returns call to provider office, please advise to call [place] at [phone].  [place] Management

## 2020-12-02 NOTE — Chronic Care Management (AMB) (Signed)
  Care Management   Note  12/02/2020 Name: Ethan Clayburn MRN: [id number] DOB: July 11, 1941  [name] is a 80 y.o. year old male who is a primary care patient of [name], MD and is actively engaged with the care management team. I reached out to [employer] by phone today to assist with re-scheduling an initial visit with the RN Case Manager  Follow up plan: Telephone appointment with care management team member scheduled for:12/04/2020  [place] Management

## 2020-12-02 NOTE — Telephone Encounter (Signed)
Spoke with patient rescheduled for 12/04/2020

## 2020-12-04 ENCOUNTER — Ambulatory Visit (INDEPENDENT_AMBULATORY_CARE_PROVIDER_SITE_OTHER): Payer: Medicare Other | Admitting: *Deleted

## 2020-12-04 DIAGNOSIS — I4891 Unspecified atrial fibrillation: Secondary | ICD-10-CM | POA: Diagnosis not present

## 2020-12-04 DIAGNOSIS — I5042 Chronic combined systolic (congestive) and diastolic (congestive) heart failure: Secondary | ICD-10-CM | POA: Diagnosis not present

## 2020-12-04 DIAGNOSIS — M48061 Spinal stenosis, lumbar region without neurogenic claudication: Secondary | ICD-10-CM

## 2020-12-04 DIAGNOSIS — M545 Low back pain, unspecified: Secondary | ICD-10-CM

## 2020-12-04 DIAGNOSIS — M5136 Other intervertebral disc degeneration, lumbar region: Secondary | ICD-10-CM

## 2020-12-04 NOTE — Patient Instructions (Addendum)
Visit Information  PATIENT GOALS:  Goals Addressed            This Visit's Progress   . (RNCM) Manage Chronic Pain       Timeframe:  Long-Range Goal Priority:  High Start Date:  12/04/20                           Expected End Date:   05/09/21                    Follow Up Date 01/01/21    . Develop a personal pain management plan . Plan exercise or activity when pain is best controlled . Prioritize tasks for the day . Track times pain is worst and when it is best . Track what makes the pain worse and what makes it better  . Discuss pain with primary care provider   Why is this important?    Day-to-day life can be hard when you have chronic pain.   Pain medicine is just one piece of the treatment puzzle.   You can try these action steps to help you manage your pain.    Notes:    [name] (RNCM) Manage My Fatigue (Tiredness-Chronic Pain)       Timeframe:  Long-Range Goal Priority:  High Start Date:   12/04/20                          Expected End Date:   05/09/21                    Follow Up Date 01/01/21    . Eat healthy . Limit daytime naps . Practice relaxation or meditation daily  . Review medications with CCM Pharmacist to discuss possible side effects   Why is this important?    Chronic pain and fatigue often go together.   For many, pain disturbs sleep, leaving you tired. Poor sleep together with chronic pain can drain you of energy.   There are healthy and positive ways to manage fatigue.    Notes:    [name] (RNCM) Track and Manage Fluids and Swelling-Heart Failure       Timeframe:  Long-Range Goal Priority:  Medium Start Date:  12/04/20                           Expected End Date:  05/09/21                     Follow Up Date 01/01/21    . Call office if I gain more than 2 pounds in one day or 5 pounds in one week . Weigh myself daily, at same time each day and record in log . Try stick with low salt heart healthy carbohydrate modified diabetic diet . Continue to  check blood sugars daily . Monitor yourself for rapid irregular heart rate, taking prn medication if indicated and notifying provider   Why is this important?    It is important to check your weight daily and watch how much salt and liquids you have.   It will help you to manage your heart failure.    Notes:     Consent to CCM Services: Mr. Glogowski was given information about Chronic Care Management services today including:  1. CCM service includes personalized support from designated clinical staff supervised by his physician,  including individualized plan of care and coordination with other care providers 2. 24/7 contact phone numbers for assistance for urgent and routine care needs. 3. Service will only be billed when office clinical staff spend 20 minutes or more in a month to coordinate care. 4. Only one practitioner may furnish and bill the service in a calendar month. 5. The patient may stop CCM services at any time (effective at the end of the month) by phone call to the office staff. 6. The patient will be responsible for cost sharing (co-pay) of up to 20% of the service fee (after annual deductible is met).  Patient agreed to services and verbal consent obtained.   Patient verbalizes understanding of instructions provided today and agrees to view in [place].   The care management team will reach out to the patient again over the next 45 business days.   Hubert Azure RN, MSN RN Care Management Coordinator  [facility] [phone] [email or web address]     Fatigue If you have fatigue, you feel tired all the time and have a lack of energy or a lack of motivation. Fatigue may make it difficult to start or complete tasks because of exhaustion. In general, occasional or mild fatigue is often a normal response to activity or life. However, long-lasting (chronic) or extreme fatigue may be a symptom of a medical condition. Follow these  instructions at home: General instructions  Watch your fatigue for any changes.  Go to bed and get up at the same time every day.  Avoid fatigue by pacing yourself during the day and getting enough sleep at night.  Maintain a healthy weight. Medicines  Take over-the-counter and prescription medicines only as told by your health care provider.  Take a multivitamin, if told by your health care provider.  Do not use herbal or dietary supplements unless they are approved by your health care provider. Activity  Exercise regularly, as told by your health care provider.  Use or practice techniques to help you relax, such as yoga, tai chi, meditation, or massage therapy.   Eating and drinking  Avoid heavy meals in the evening.  Eat a well-balanced diet, which includes lean proteins, whole grains, plenty of fruits and vegetables, and low-fat dairy products.  Avoid consuming too much caffeine.  Avoid the use of alcohol.  Drink enough fluid to keep your urine pale yellow.   Lifestyle  Change situations that cause you stress. Try to keep your work and personal schedule in balance.  Do not use any products that contain nicotine or tobacco, such as cigarettes and e-cigarettes. If you need help quitting, ask your health care provider.  Do not use drugs. Contact a health care provider if:  Your fatigue does not get better.  You have a fever.  You suddenly lose or gain weight.  You have headaches.  You have trouble falling asleep or sleeping through the night.  You feel angry, guilty, anxious, or sad.  You are unable to have a bowel movement (constipation).  Your skin is dry.  You have swelling in your legs or another part of your body. Get help right away if:  You feel confused.  Your vision is blurry.  You feel faint or you pass out.  You have a severe headache.  You have severe pain in your abdomen, your back, or the area between your waist and hips  (pelvis).  You have chest pain, shortness of breath, or an irregular or fast heartbeat.  You are  unable to urinate, or you urinate less than normal.  You have abnormal bleeding, such as bleeding from the rectum, vagina, nose, lungs, or nipples.  You vomit blood.  You have thoughts about hurting yourself or others. If you ever feel like you may hurt yourself or others, or have thoughts about taking your own life, get help right away. You can go to your nearest emergency department or call:  Your local emergency services (911 in the [place]  A suicide crisis helpline, such as the [place] at 754-034-4622. This is open 24 hours a day. Summary  If you have fatigue, you feel tired all the time and have a lack of energy or a lack of motivation.  Fatigue may make it difficult to start or complete tasks because of exhaustion.  Long-lasting (chronic) or extreme fatigue may be a symptom of a medical condition.  Exercise regularly, as told by your health care provider.  Change situations that cause you stress. Try to keep your work and personal schedule in balance. This information is not intended to replace advice given to you by your health care provider. Make sure you discuss any questions you have with your health care provider. Document Revised: 04/18/2019 Document Reviewed: 06/22/2017 Elsevier Patient Education  2021 [place].  CLINICAL CARE PLAN: Patient Care Plan: Heart Failure (Adult)  Problem Identified: Heart Failure Management   Priority: Medium  Long-Range Goal: Symptom Exacerbation Prevented or Minimized   Start Date: 12/04/2020  Expected End Date: 05/09/2021  Priority: Medium  Current Barriers:  [name] Knowledge deficits related to basic heart failure pathophysiology and self care management as evidenced by patient not weighing himself daily.  Patient does acknowledge history of congestive heart failure but states no one has told him to weigh  himself daily.  Denies current shortness of breath or swelling in lower extremities. Nurse Case Manager Clinical Goal(s):   Over the next 30 days, patient will weigh self daily and record  Over the next 120 days, patient will verbalize understanding of Heart Failure Action Plan and when to call doctor Interventions:  . Collaboration with [name], MD regarding development and update of comprehensive plan of care as evidenced by provider attestation and co-signature . Inter-disciplinary care team collaboration (see longitudinal plan of care) . Basic overview and discussion of pathophysiology of Heart Failure . Provided verbal education on low sodium heart healthy carbohydrate modified diabetic diet . Reviewed signs and symptoms of heart failure and discussed Heart Failure Action Plan  . Discussed importance of daily weight and when to call provider based on weight . Reviewed role of diuretics in prevention of fluid overload . Sending EMMI Heart Failure educational video  . Encouraged to continue to monitor heart rate for increase rate and being out of rhythm  . Reviewed medications and encouraged medication compliance . Reviewed current Hgb A1C and congratulated patient on maintaining glycemic controlled levels and encouraged continue daily monitoring of blood sugars Patient Goals/Self-Care Activities Over the next 30 days, patient will:  . Call office if I gain more than 2 pounds in one day or 5 pounds in one week . Weigh myself daily, at same time each day and record in log . Try stick with low salt heart healthy carbohydrate modified diabetic diet . Continue to check blood sugars daily . Monitor yourself for rapid irregular heart rate, taking prn medication if indicated and notifying provider Follow Up Plan: The care management team will reach out to the patient again  over the next 45 business days.    Patient Care Plan: Chronic Pain (Adult)  Problem Identified: Increase in  episodes of chronic pain and feelings of being tired more   Priority: High  Long-Range Goal: Pain Management Plan Developed and Energy Increased   Start Date: 12/04/2020  Expected End Date: 05/09/2021  Priority: High  Current Barriers:   Ineffective Self Health Maintenance of chronic pain and energy level as evidenced by increased episodes of pain flares and constant feelings of being tired even after good nights sleep.  Patient reports he has had increase in pain flares requiring taking pain medication to help with relief.  Also states he constantly feels tired and not sure why.  Is asking if some of his medications could be causing the feelings of tiredness.  Also states he is unsure why his pain has increased and is wondering if it is just related to getting older.  Knowledge Deficits related to short term plan for care coordination needs and long term plans for chronic disease management needs Clinical Goal(s):  [name] Collaboration with [name], MD regarding development and update of comprehensive plan of care as evidenced by provider attestation and co-signature . Inter-disciplinary care team collaboration (see longitudinal plan of care)  Over the next 90 days, patient will work with care management team to address care coordination and chronic disease management needs related to increased pain and feelings of tiredness   Interventions:   Evaluation of current treatment plan related to chronic pain self-management and patient's adherence to plan as established by provider.  Discussed plans with patient for ongoing care management follow up and provided patient with direct contact information for care management team  Reviewed medications and encouraged medication compliance; discussed limited to no use of Ibuprofen while on Eliquis  Discussed CCM Pharmacist and medication reconciliation, sending message to CCM Pharmacist to contact patient to review medication list and possible side  effects  Encouraged continued diversional activities like prayer and meditation  Encouraged patient to discuss pain and tiredness with primary care provider with hopes to develop treatment plan  Encouraged to monitor pain triggers and what helps treat/manage pain  Reviewed sleep pattern and encouraged at least 6-8 hours nightly sleep  Sending education on Fatigue Patient Goals/Self Care Activities:  Over the next 30 days, patient will:  . Develop a personal pain management plan . Plan exercise or activity when pain is best controlled . Prioritize tasks for the day . Track times pain is worst and when it is best . Track what makes the pain worse and what makes it better  . Discuss pain with primary care provider . Eat healthy . Limit daytime naps . Practice relaxation or meditation daily  . Review medications with CCM Pharmacist to discuss possible side effects  Follow Up Plan: The care management team will reach out to the patient again over the next 45 business days.

## 2020-12-04 NOTE — Chronic Care Management (AMB) (Signed)
Chronic Care Management   CCM RN Visit Note  12/04/2020 Name: Brent Taylor MRN: [id number] DOB: 02/21/1941  Subjective: Brent Taylor is a 80 y.o. year old male who is a primary care patient of [name], MD. The care management team was consulted for assistance with disease management and care coordination needs.    Engaged with patient by telephone for initial visit in response to provider referral for case management and/or care coordination services.   Consent to Services:  The patient was given the following information about Chronic Care Management services today, agreed to services, and gave verbal consent: 1. CCM service includes personalized support from designated clinical staff supervised by the primary care provider, including individualized plan of care and coordination with other care providers 2. 24/7 contact phone numbers for assistance for urgent and routine care needs. 3. Service will only be billed when office clinical staff spend 20 minutes or more in a month to coordinate care. 4. Only one practitioner may furnish and bill the service in a calendar month. 5.The patient may stop CCM services at any time (effective at the end of the month) by phone call to the office staff. 6. The patient will be responsible for cost sharing (co-pay) of up to 20% of the service fee (after annual deductible is met). Patient agreed to services and consent obtained.  Patient agreed to services and verbal consent obtained.   Assessment: Review of patient past medical history, allergies, medications, health status, including review of consultants reports, laboratory and other test data, was performed as part of comprehensive evaluation and provision of chronic care management services.   SDOH (Social Determinants of Health) assessments and interventions performed:  SDOH Interventions   Flowsheet Row Most Recent Value  SDOH Interventions   Food Insecurity Interventions Intervention Not Indicated   Financial Strain Interventions Intervention Not Indicated  Housing Interventions Intervention Not Indicated  Intimate Partner Violence Interventions Intervention Not Indicated  Stress Interventions Intervention Not Indicated  Transportation Interventions Intervention Not Indicated       CCM Care Plan  Allergies  Allergen Reactions  . Lipitor [Atorvastatin Calcium]     Muscle ache    Outpatient Encounter Medications as of 12/04/2020  Medication Sig Note  . apixaban (ELIQUIS) 5 MG TABS tablet Take 1 tablet (5 mg total) by mouth 2 (two) times daily.   [name] diltiazem (CARDIZEM) 30 MG tablet Take 1 tablet every 4 hours AS NEEDED for afib heart rate >100 as long as top blood pressure >100.   . ibuprofen (ADVIL,MOTRIN) 200 MG tablet Take 600 mg by mouth as needed for moderate pain.   . metoprolol tartrate (LOPRESSOR) 25 MG tablet Take 0.5 tablets (12.5 mg total) by mouth 2 (two) times daily.   . pantoprazole (PROTONIX) 20 MG tablet Take 1 tablet (20 mg total) by mouth daily.   . pravastatin (PRAVACHOL) 40 MG tablet Take 40 mg by mouth daily.   . quinapril (ACCUPRIL) 20 MG tablet Take 1 tablet (20 mg total) by mouth at bedtime.   . sildenafil (VIAGRA) 100 MG tablet Take 1 tablet (100 mg total) by mouth daily as needed for erectile dysfunction.   . traMADol (ULTRAM) 50 MG tablet Take 100 mg by mouth every 12 (twelve) hours as needed for severe pain.   . Blood Glucose Monitoring Suppl (ONE TOUCH ULTRA 2) w/Device KIT Use daily to check blood sugar.   [name] FLUoxetine (PROZAC) 40 MG capsule Take 1 capsule (40 mg total) by mouth daily. (Patient not  taking: Reported on 12/04/2020) 12/04/2020: Reports not taking at this time  . glucose blood test strip Use to test blood sugars daily. Dx: E11.9   . Lancets (ONETOUCH ULTRASOFT) lancets Use to test blood sugars daily. Dx: E11.9    No facility-administered encounter medications on file as of 12/04/2020.    Patient Active Problem List   Diagnosis Date Noted   . Sensorineural hearing loss (SNHL) of left ear with restricted hearing of right ear 08/19/2020  . Subjective tinnitus, bilateral 08/19/2020  . Atrial fibrillation ([place]) 06/10/2020  . Acquired thrombophilia ([place]) 03/31/2020  . Degeneration of lumbar intervertebral disc 03/31/2020  . Low back pain 03/11/2020  . Spinal stenosis of lumbar region 08/29/2019  . Type 2 diabetes mellitus with vascular disease ([place]) 10/20/2016  . Hypertension associated with diabetes ([place]) 11/20/2015  . Chronic combined systolic and diastolic heart failure ([place])   . Coronary artery disease 12/10/2011  . Dyslipidemia associated with type 2 diabetes mellitus ([place]) 12/10/2011  . Major depression in remission ([place]) 12/10/2011  . GERD (gastroesophageal reflux disease) 12/10/2011  . Hx of colonic polyps 12/10/2011  . Melanoma ([place]) 12/10/2011    Conditions to be addressed/monitored:Atrial Fibrillation, CHF and Chronic Pain  Care Plan : Heart Failure (Adult)  Updates made by Leona Singleton, RN since 12/04/2020 12:00 AM  Problem: Heart Failure Management   Priority: Medium  Long-Range Goal: Symptom Exacerbation Prevented or Minimized   Start Date: 12/04/2020  Expected End Date: 05/09/2021  Priority: Medium  Current Barriers:  [name] Knowledge deficits related to basic heart failure pathophysiology and self care management as evidenced by patient not weighing himself daily.  Patient does acknowledge history of congestive heart failure but states no one has told him to weigh himself daily.  Denies current shortness of breath or swelling in lower extremities. Nurse Case Manager Clinical Goal(s):   Over the next 30 days, patient will weigh self daily and record  Over the next 120 days, patient will verbalize understanding of Heart Failure Action Plan and when to call doctor Interventions:  . Collaboration with [name], MD regarding development and update of comprehensive plan of care as evidenced by provider  attestation and co-signature . Inter-disciplinary care team collaboration (see longitudinal plan of care) . Basic overview and discussion of pathophysiology of Heart Failure . Provided verbal education on low sodium heart healthy carbohydrate modified diabetic diet . Reviewed signs and symptoms of heart failure and discussed Heart Failure Action Plan  . Discussed importance of daily weight and when to call provider based on weight . Reviewed role of diuretics in prevention of fluid overload . Sending EMMI Heart Failure educational video  . Encouraged to continue to monitor heart rate for increase rate and being out of rhythm  . Reviewed medications and encouraged medication compliance . Reviewed current Hgb A1C and congratulated patient on maintaining glycemic controlled levels and encouraged continue daily monitoring of blood sugars Patient Goals/Self-Care Activities Over the next 30 days, patient will:  . Call office if I gain more than 2 pounds in one day or 5 pounds in one week . Weigh myself daily, at same time each day and record in log . Try stick with low salt heart healthy carbohydrate modified diabetic diet . Continue to check blood sugars daily . Monitor yourself for rapid irregular heart rate, taking prn medication if indicated and notifying provider Follow Up Plan: The care management team will reach out to the patient again over the next 45 business days.  Care Plan : Chronic Pain (Adult)  Updates made by Leona Singleton, RN since 12/04/2020 12:00 AM  Problem: Increase in episodes of chronic pain and feelings of being tired more   Priority: High  Long-Range Goal: Pain Management Plan Developed and Energy Increased   Start Date: 12/04/2020  Expected End Date: 05/09/2021  Priority: High  Current Barriers:   Ineffective Self Health Maintenance of chronic pain and energy level as evidenced by increased episodes of pain flares and constant feelings of being tired even after  good nights sleep.  Patient reports he has had increase in pain flares requiring taking pain medication to help with relief.  Also states he constantly feels tired and not sure why.  Is asking if some of his medications could be causing the feelings of tiredness.  Also states he is unsure why his pain has increased and is wondering if it is just related to getting older.  Knowledge Deficits related to short term plan for care coordination needs and long term plans for chronic disease management needs Clinical Goal(s):  [name] Collaboration with [name], MD regarding development and update of comprehensive plan of care as evidenced by provider attestation and co-signature . Inter-disciplinary care team collaboration (see longitudinal plan of care)  Over the next 90 days, patient will work with care management team to address care coordination and chronic disease management needs related to increased pain and feelings of tiredness   Interventions:   Evaluation of current treatment plan related to chronic pain self-management and patient's adherence to plan as established by provider.  Discussed plans with patient for ongoing care management follow up and provided patient with direct contact information for care management team  Reviewed medications and encouraged medication compliance; discussed limited to no use of Ibuprofen while on Eliquis  Discussed CCM Pharmacist and medication reconciliation, sending message to CCM Pharmacist to contact patient to review medication list and possible side effects  Encouraged continued diversional activities like prayer and meditation  Encouraged patient to discuss pain and tiredness with primary care provider with hopes to develop treatment plan  Encouraged to monitor pain triggers and what helps treat/manage pain  Reviewed sleep pattern and encouraged at least 6-8 hours nightly sleep  Sending education on Fatigue Patient Goals/Self Care Activities:   Over the next 30 days, patient will:  . Develop a personal pain management plan . Plan exercise or activity when pain is best controlled . Prioritize tasks for the day . Track times pain is worst and when it is best . Track what makes the pain worse and what makes it better  . Discuss pain with primary care provider . Eat healthy . Limit daytime naps . Practice relaxation or meditation daily  . Review medications with CCM Pharmacist to discuss possible side effects  Follow Up Plan: The care management team will reach out to the patient again over the next 45 business days.     Plan:The care management team will reach out to the patient again over the next 45 business days.  Hubert Azure RN, MSN RN Care Management Coordinator  [place] [facility] [phone] [email or web address]

## 2020-12-05 ENCOUNTER — Ambulatory Visit: Payer: Medicare Other

## 2020-12-05 NOTE — Chronic Care Management (AMB) (Signed)
  Chronic Care Management   Outreach Note   Name: Brent Taylor MRN: [id number] DOB: 09-11-1941  Referred by: [name], MD Reason for referral: Telephone Appointment with [facility] Pharmacist, Madelin Rear.   An unsuccessful telephone outreach was attempted today. The patient was referred to the pharmacist for assistance with care management and care coordination.   Madelin Rear, Pharm.D., BCGP Clinical Pharmacist [facility] [phone]

## 2020-12-05 NOTE — Progress Notes (Unsigned)
Chronic Care Management Pharmacy Note  12/05/2020 Name:  Brent Taylor MRN:  [id number] DOB:  1941-04-15  Subjective: Brent Taylor is an 80 y.o. year old male who is a primary patient of [name], Algis Greenhouse, MD.  The CCM team was consulted for assistance with disease management and care coordination needs.   {CCMTELEPHONEFACETOFACE:21091510} for {CCMINITIALFOLLOWUPCHOICE:21091511} in response to provider referral for pharmacy case management and/or care coordination services.   Consent to Services:  {CCMCONSENTOPTIONS:25074} Patient Care Team: [name], MD as PCP - General (Family Medicine) Madelin Rear, [facility] (Pharmacist) Madelin Rear, [facility] as Pharmacist (Pharmacist) Leona Singleton, RN as Case Manager  Recent office visits: *** Recent consult visits: ***   Objective: Lab Results  Component Value Date   CREATININE 1.20 02/19/2020   BUN 23 02/19/2020   GFR 58.07 (L) 02/19/2020   GFRNONAA 60 (L) 04/25/2016   GFRAA >60 04/25/2016   NA 137 02/19/2020   K 4.5 02/19/2020   CALCIUM 9.3 02/19/2020   CO2 25 02/19/2020   Lab Results  Component Value Date/Time   HGBA1C 6.2 (A) 10/13/2020 08:31 AM   HGBA1C 6.4 11/05/2019 08:53 AM   HGBA1C 6.1 (A) 05/22/2019 10:24 AM   HGBA1C 6.4 10/30/2018 09:12 AM   GFR 58.07 (L) 02/19/2020 01:29 PM   GFR 58.68 (L) 11/05/2019 08:53 AM   MICROALBUR 1.9 10/25/2017 09:25 AM   MICROALBUR 2.6 (H) 10/20/2016 09:27 AM    Last diabetic Eye exam:  Lab Results  Component Value Date/Time   HMDIABEYEEXA No Retinopathy 05/08/2018 12:24 PM    Last diabetic Foot exam:  Lab Results  Component Value Date/Time   HMDIABFOOTEX NORMAL 12/10/2011 12:00 AM    Lab Results  Component Value Date   CHOL 162 11/05/2019   HDL 34.80 (L) 11/05/2019   LDLCALC 87 11/05/2019   LDLDIRECT 142.7 12/10/2011   TRIG 197.0 (H) 11/05/2019   CHOLHDL 5 11/05/2019   Hepatic Function Latest Ref Rng & Units 02/19/2020 11/05/2019 10/30/2018  Total Protein 6.0 - 8.3 g/dL 6.9  6.3 6.8  Albumin 3.5 - 5.2 g/dL 4.3 4.1 4.3  AST 0 - 37 U/L $Remo'29 29 31  'rYbfN$ ALT 0 - 53 U/L 34 29 31  Alk Phosphatase 39 - 117 U/L 61 52 54  Total Bilirubin 0.2 - 1.2 mg/dL 0.7 0.7 0.7   Lab Results  Component Value Date/Time   TSH 2.42 02/19/2020 01:29 PM   TSH 2.52 11/05/2019 08:53 AM   FREET4 1.06 11/09/2015 10:15 PM   CBC Latest Ref Rng & Units 02/19/2020 11/05/2019 10/30/2018  WBC 4.0 - 10.5 K/uL 8.1 5.2 5.2  Hemoglobin 13.0 - 17.0 g/dL 16.6 15.8 15.9  Hematocrit 39.0 - 52.0 % 49.7 48.5 48.3  Platelets 150.0 - 400.0 K/uL 196.0 177.0 205.0   No results found for: VD25OH  Clinical ASCVD: {YES/NO:21197} The ASCVD Risk score Mikey Bussing DC Jr., et al., 2013) failed to calculate for the following reasons:   The 2013 ASCVD risk score is only valid for ages 94 to 80    No results found for: VITAMINB12  Depression screen [facility] 2/9 12/04/2020 10/13/2020 08/11/2020  Decreased Interest 0 1 0  Down, Depressed, Hopeless 0 0 0  PHQ - 2 Score 0 1 0  Altered sleeping - 0 -  Tired, decreased energy - 2 -  Change in appetite - 1 -  Feeling bad or failure about yourself  - 0 -  Trouble concentrating - 0 -  Moving slowly or fidgety/restless - 0 -  Suicidal  thoughts - 0 -  PHQ-9 Score - 4 -  Difficult doing work/chores - Not difficult at all -    Social History   Tobacco Use  Smoking Status Never Smoker  Smokeless Tobacco Never Used   BP Readings from Last 3 Encounters:  10/13/20 (!) 142/81  10/09/20 (!) 168/94  07/28/20 118/84   Pulse Readings from Last 3 Encounters:  10/13/20 60  10/09/20 76  07/28/20 (!) 47   Wt Readings from Last 3 Encounters:  10/13/20 245 lb 6.4 oz (111.3 kg)  10/09/20 243 lb 9.6 oz (110.5 kg)  07/28/20 246 lb 12.8 oz (111.9 kg)   Assessment/Interventions: Review of patient past medical history, allergies, medications, health status, including review of consultants reports, laboratory and other test data, was performed as part of comprehensive evaluation and provision  of chronic care management services.   SDOH:  (Social Determinants of Health) assessments and interventions performed: {yes/no:20286}  CCM Care Plan Allergies  Allergen Reactions  . Lipitor [Atorvastatin Calcium]     Muscle ache   Medications Reviewed Today    Reviewed by Leona Singleton, RN (Registered Nurse) on 12/04/20 at [id number]  Med List Status: <None>  Medication Order Taking? Sig Documenting Provider Last Dose Status Informant  apixaban (ELIQUIS) 5 MG TABS tablet [id number] Yes Take 1 tablet (5 mg total) by mouth 2 (two) times daily. [name], MD Taking Active   Blood Glucose Monitoring Suppl (ONE TOUCH ULTRA 2) w/Device KIT [id number]  Use daily to check blood sugar. [name], MD  Active   diltiazem (CARDIZEM) 30 MG tablet [id number] Yes Take 1 tablet every 4 hours AS NEEDED for afib heart rate >100 as long as top blood pressure >100. Sherran Needs, NP Taking Active   FLUoxetine (PROZAC) 40 MG capsule [id number] No Take 1 capsule (40 mg total) by mouth daily.  Patient not taking: Reported on 12/04/2020   [name], MD Not Taking Active            Med Note [facility] D   Thu Dec 04, 2020  9:40 AM) Reports not taking at this time  glucose blood test strip [id number]  Use to test blood sugars daily. Dx: E11.9 [name], MD  Active   ibuprofen (ADVIL,MOTRIN) 200 MG tablet [id number] Yes Take 600 mg by mouth as needed for moderate pain. [provider] Taking Active Self  Lancets Glory Rosebush ULTRASOFT) lancets [id number]  Use to test blood sugars daily. Dx: E11.9 [name], MD  Active   metoprolol tartrate (LOPRESSOR) 25 MG tablet [id number] Yes Take 0.5 tablets (12.5 mg total) by mouth 2 (two) times daily. Thompson Grayer, MD Taking Active   pantoprazole (PROTONIX) 20 MG tablet [id number] Yes Take 1 tablet (20 mg total) by mouth daily. [name], MD Taking Active   pravastatin (PRAVACHOL) 40 MG tablet [id number] Yes Take 40 mg by mouth daily.  [provider] Taking Active   quinapril (ACCUPRIL) 20 MG tablet [id number] Yes Take 1 tablet (20 mg total) by mouth at bedtime. [name], MD Taking Active   sildenafil (VIAGRA) 100 MG tablet [id number] Yes Take 1 tablet (100 mg total) by mouth daily as needed for erectile dysfunction. Thompson Grayer, MD Taking Active   traMADol Veatrice Bourbon) 50 MG tablet [id number] Yes Take 100 mg by mouth every 12 (twelve) hours as needed for severe pain. [provider] Taking Active Self         Patient Active Problem List  Diagnosis Date Noted  . Sensorineural hearing loss (SNHL) of left ear with restricted hearing of right ear 08/19/2020  . Subjective tinnitus, bilateral 08/19/2020  . Atrial fibrillation ([place]) 06/10/2020  . Acquired thrombophilia ([place]) 03/31/2020  . Degeneration of lumbar intervertebral disc 03/31/2020  . Low back pain 03/11/2020  . Spinal stenosis of lumbar region 08/29/2019  . Type 2 diabetes mellitus with vascular disease ([place]) 10/20/2016  . Hypertension associated with diabetes ([place]) 11/20/2015  . Chronic combined systolic and diastolic heart failure ([place])   . Coronary artery disease 12/10/2011  . Dyslipidemia associated with type 2 diabetes mellitus ([place]) 12/10/2011  . Major depression in remission ([place]) 12/10/2011  . GERD (gastroesophageal reflux disease) 12/10/2011  . Hx of colonic polyps 12/10/2011  . Melanoma ([place]) 12/10/2011   Immunization History  Administered Date(s) Administered  . Influenza Split 07/14/2012  . Influenza, High Dose Seasonal PF 07/28/2015, 09/14/2016, 10/07/2017, 07/11/2018, 05/31/2019  . Influenza,inj,Quad PF,6+ Mos 07/16/2013, 07/25/2014  . Influenza,inj,quad, With Preservative 07/16/2019  . PFIZER(Purple Top)SARS-COV-2 Vaccination 11/19/2019, 12/14/2019, 08/22/2020  . Pneumococcal Conjugate-13 03/13/2014  . Pneumococcal Polysaccharide-23 07/16/2013  . Tetanus 03/13/2014    Conditions to be addressed/monitored:   {USCCMDZASSESSMENTOPTIONS:23563} There are no care plans that you recently modified to display for this patient.   Medication Assistance: {MEDASSISTANCEINFO:25044} Patient's preferred pharmacy is:  [facility], [address] [place] [address] Phone: [phone] Fax: [phone]  Uses pill box? {Yes or If no, why not?:20788}. Pt endorses ***% compliance. We discussed: {Pharmacy options:24294}. Patient decided to: {US Pharmacy LEXN:17001} Patient agrees to Care Plan and Follow-up***.  Current Barriers:  . {pharmacybarriers:24917}  Pharmacist Clinical Goal(s):  [name] Over the next *** days, patient will {PHARMACYGOALCHOICES:24921} through collaboration with PharmD and provider.  . ***  Interventions: . 1:1 collaboration with [name], MD regarding development and update of comprehensive plan of care as evidenced by provider attestation and co-signature . Inter-disciplinary care team collaboration (see longitudinal plan of care) . Comprehensive medication review performed; medication list updated in electronic medical record  {CCM PHARMD DISEASE STATES:25130} Patient Goals/Self-Care Activities . Over the next *** days, patient will:  - {pharmacypatientgoals:24919}  Future Appointments  Date Time Provider [place]  12/09/2020  8:00 AM [name], MD [facility]  01/01/2021 10:00 AM [facility]  02/11/2021  1:30 PM Sherran Needs, NP [facility] None  08/17/2021  1:00 PM [facility] HEALTH COACH [facility]   Follow-up plan with Care Management Team: . CPA: *** . RPH: *** visit  Madelin Rear, Pharm.D., BCGP Clinical Pharmacist [place] [phone]

## 2020-12-08 ENCOUNTER — Ambulatory Visit: Payer: Medicare Other

## 2020-12-08 DIAGNOSIS — M9901 Segmental and somatic dysfunction of cervical region: Secondary | ICD-10-CM | POA: Diagnosis not present

## 2020-12-08 DIAGNOSIS — M9903 Segmental and somatic dysfunction of lumbar region: Secondary | ICD-10-CM | POA: Diagnosis not present

## 2020-12-08 DIAGNOSIS — M5440 Lumbago with sciatica, unspecified side: Secondary | ICD-10-CM | POA: Diagnosis not present

## 2020-12-08 DIAGNOSIS — M5430 Sciatica, unspecified side: Secondary | ICD-10-CM | POA: Diagnosis not present

## 2020-12-08 NOTE — Chronic Care Management (AMB) (Signed)
  Chronic Care Management   Outreach Note   Name: Brent Taylor MRN: [id number] DOB: [birth year]  Referred by: [name], MD Reason for referral: Telephone Appointment with [facility] Pharmacist, Madelin Rear.   An unsuccessful telephone outreach was attempted today. The patient was referred to the pharmacist for assistance with care management and care coordination.   Pt at [place] office at time of call requesting call back later in the day or tomorrow  Telephone appointment with clinical pharmacist today (12/08/2020). If patient immediately returns call, transfer to [phone]. Otherwise, please provide this number so patient can reschedule visit.   Madelin Rear, Pharm.D., BCGP Clinical Pharmacist [facility] [phone]

## 2020-12-08 NOTE — Progress Notes (Unsigned)
Chronic Care Management Pharmacy Note  12/08/2020 Name:  Brent Taylor MRN:  [id number] DOB:  03/25/1941  Subjective: Brent Taylor is an 80 y.o. year old male who is a primary patient of [name], Algis Greenhouse, MD.  The CCM team was consulted for assistance with disease management and care coordination needs.   {CCMTELEPHONEFACETOFACE:21091510} for {CCMINITIALFOLLOWUPCHOICE:21091511} in response to provider referral for pharmacy case management and/or care coordination services.   Consent to Services:  {CCMCONSENTOPTIONS:25074} Patient Care Team: [name], MD as PCP - General (Family Medicine) Madelin Rear, [facility] (Pharmacist) Madelin Rear, [facility] as Pharmacist (Pharmacist) Leona Singleton, RN as Case Manager  Recent office visits: *** Recent consult visits: ***   Objective: Lab Results  Component Value Date   CREATININE 1.20 02/19/2020   BUN 23 02/19/2020   GFR 58.07 (L) 02/19/2020   GFRNONAA 60 (L) 04/25/2016   GFRAA >60 04/25/2016   NA 137 02/19/2020   K 4.5 02/19/2020   CALCIUM 9.3 02/19/2020   CO2 25 02/19/2020   Lab Results  Component Value Date/Time   HGBA1C 6.2 (A) 10/13/2020 08:31 AM   HGBA1C 6.4 11/05/2019 08:53 AM   HGBA1C 6.1 (A) 05/22/2019 10:24 AM   HGBA1C 6.4 10/30/2018 09:12 AM   GFR 58.07 (L) 02/19/2020 01:29 PM   GFR 58.68 (L) 11/05/2019 08:53 AM   MICROALBUR 1.9 10/25/2017 09:25 AM   MICROALBUR 2.6 (H) 10/20/2016 09:27 AM    Last diabetic Eye exam:  Lab Results  Component Value Date/Time   HMDIABEYEEXA No Retinopathy 05/08/2018 12:24 PM    Last diabetic Foot exam:  Lab Results  Component Value Date/Time   HMDIABFOOTEX NORMAL 12/10/2011 12:00 AM    Lab Results  Component Value Date   CHOL 162 11/05/2019   HDL 34.80 (L) 11/05/2019   LDLCALC 87 11/05/2019   LDLDIRECT 142.7 12/10/2011   TRIG 197.0 (H) 11/05/2019   CHOLHDL 5 11/05/2019   Hepatic Function Latest Ref Rng & Units 02/19/2020 11/05/2019 10/30/2018  Total Protein 6.0 - 8.3 g/dL 6.9  6.3 6.8  Albumin 3.5 - 5.2 g/dL 4.3 4.1 4.3  AST 0 - 37 U/L _0 ALT 0 - 53 U/L 34 29 31  Alk Phosphatase 39 - 117 U/L 61 52 54  Total Bilirubin 0.2 - 1.2 mg/dL 0.7 0.7 0.7   Lab Results  Component Value Date/Time   TSH 2.42 02/19/2020 01:29 PM   TSH 2.52 11/05/2019 08:53 AM   FREET4 1.06 11/09/2015 10:15 PM   CBC Latest Ref Rng & Units 02/19/2020 11/05/2019 10/30/2018  WBC 4.0 - 10.5 K/uL 8.1 5.2 5.2  Hemoglobin 13.0 - 17.0 g/dL 16.6 15.8 15.9  Hematocrit 39.0 - 52.0 % 49.7 48.5 48.3  Platelets 150.0 - 400.0 K/uL 196.0 177.0 205.0   No results found for: VD25OH  Clinical ASCVD: {YES/NO:21197} The ASCVD Risk score Mikey Bussing DC Jr., et al., 2013) failed to calculate for the following reasons:   The 2013 ASCVD risk score is only valid for ages 88 to 103    No results found for: VITAMINB12  Depression screen [facility] 2/9 12/04/2020 10/13/2020 08/11/2020  Decreased Interest 0 1 0  Down, Depressed, Hopeless 0 0 0  PHQ - 2 Score 0 1 0  Altered sleeping - 0 -  Tired, decreased energy - 2 -  Change in appetite - 1 -  Feeling bad or failure about yourself  - 0 -  Trouble concentrating - 0 -  Moving slowly or fidgety/restless - 0 -  Suicidal  thoughts - 0 -  PHQ-9 Score - 4 -  Difficult doing work/chores - Not difficult at all -    Social History   Tobacco Use  Smoking Status Never Smoker  Smokeless Tobacco Never Used   BP Readings from Last 3 Encounters:  10/13/20 (!) 142/81  10/09/20 (!) 168/94  07/28/20 118/84   Pulse Readings from Last 3 Encounters:  10/13/20 60  10/09/20 76  07/28/20 (!) 47   Wt Readings from Last 3 Encounters:  10/13/20 245 lb 6.4 oz (111.3 kg)  10/09/20 243 lb 9.6 oz (110.5 kg)  07/28/20 246 lb 12.8 oz (111.9 kg)   Assessment/Interventions: Review of patient past medical history, allergies, medications, health status, including review of consultants reports, laboratory and other test data, was performed as part of comprehensive evaluation and provision  of chronic care management services.   SDOH:  (Social Determinants of Health) assessments and interventions performed: {yes/no:20286}  CCM Care Plan Allergies  Allergen Reactions  . Lipitor [Atorvastatin Calcium]     Muscle ache   Medications Reviewed Today    Reviewed by Leona Singleton, RN (Registered Nurse) on 12/04/20 at [id number]  Med List Status: <None>  Medication Order Taking? Sig Documenting Provider Last Dose Status Informant  apixaban (ELIQUIS) 5 MG TABS tablet [id number] Yes Take 1 tablet (5 mg total) by mouth 2 (two) times daily. [name], MD Taking Active   Blood Glucose Monitoring Suppl (ONE TOUCH ULTRA 2) w/Device KIT [id number]  Use daily to check blood sugar. [name], MD  Active   diltiazem (CARDIZEM) 30 MG tablet [id number] Yes Take 1 tablet every 4 hours AS NEEDED for afib heart rate >100 as long as top blood pressure >100. Sherran Needs, NP Taking Active   FLUoxetine (PROZAC) 40 MG capsule [id number] No Take 1 capsule (40 mg total) by mouth daily.  Patient not taking: Reported on 12/04/2020   [name], MD Not Taking Active            Med Note [facility]   Thu Dec 04, 2020  9:40 AM) Reports not taking at this time  glucose blood test strip [id number]  Use to test blood sugars daily. Dx: E11.9 [name], MD  Active   ibuprofen (ADVIL,MOTRIN) 200 MG tablet [id number] Yes Take 600 mg by mouth as needed for moderate pain. [provider] Taking Active Self  Lancets Glory Rosebush ULTRASOFT) lancets [id number]  Use to test blood sugars daily. Dx: E11.9 [name], MD  Active   metoprolol tartrate (LOPRESSOR) 25 MG tablet [id number] Yes Take 0.5 tablets (12.5 mg total) by mouth 2 (two) times daily. Thompson Grayer, MD Taking Active   pantoprazole (PROTONIX) 20 MG tablet [id number] Yes Take 1 tablet (20 mg total) by mouth daily. [name], MD Taking Active   pravastatin (PRAVACHOL) 40 MG tablet [id number] Yes Take 40 mg by mouth daily.  [provider] Taking Active   quinapril (ACCUPRIL) 20 MG tablet [id number] Yes Take 1 tablet (20 mg total) by mouth at bedtime. [name], MD Taking Active   sildenafil (VIAGRA) 100 MG tablet [id number] Yes Take 1 tablet (100 mg total) by mouth daily as needed for erectile dysfunction. Thompson Grayer, MD Taking Active   traMADol Veatrice Bourbon) 50 MG tablet [id number] Yes Take 100 mg by mouth every 12 (twelve) hours as needed for severe pain. [provider] Taking Active Self         Patient Active Problem List  Diagnosis Date Noted  . Sensorineural hearing loss (SNHL) of left ear with restricted hearing of right ear 08/19/2020  . Subjective tinnitus, bilateral 08/19/2020  . Atrial fibrillation ([place]) 06/10/2020  . Acquired thrombophilia ([place]) 03/31/2020  . Degeneration of lumbar intervertebral disc 03/31/2020  . Low back pain 03/11/2020  . Spinal stenosis of lumbar region 08/29/2019  . Type 2 diabetes mellitus with vascular disease ([place]) 10/20/2016  . Hypertension associated with diabetes ([place]) 11/20/2015  . Chronic combined systolic and diastolic heart failure ([place])   . Coronary artery disease 12/10/2011  . Dyslipidemia associated with type 2 diabetes mellitus ([place]) 12/10/2011  . Major depression in remission ([place]) 12/10/2011  . GERD (gastroesophageal reflux disease) 12/10/2011  . Hx of colonic polyps 12/10/2011  . Melanoma ([place]) 12/10/2011   Immunization History  Administered Date(s) Administered  . Influenza Split 07/14/2012  . Influenza, High Dose Seasonal PF 07/28/2015, 09/14/2016, 10/07/2017, 07/11/2018, 05/31/2019  . Influenza,inj,Quad PF,6+ Mos 07/16/2013, 07/25/2014  . Influenza,inj,quad, With Preservative 07/16/2019  . PFIZER(Purple Top)SARS-COV-2 Vaccination 11/19/2019, 12/14/2019, 08/22/2020  . Pneumococcal Conjugate-13 03/13/2014  . Pneumococcal Polysaccharide-23 07/16/2013  . Tetanus 03/13/2014    Conditions to be addressed/monitored:   {USCCMDZASSESSMENTOPTIONS:23563} There are no care plans that you recently modified to display for this patient.   Medication Assistance: {MEDASSISTANCEINFO:25044} Patient's preferred pharmacy is:  [facility], [address] [place] [address] Phone: [phone] Fax: [phone]  Uses pill box? {Yes or If no, why not?:20788}. Pt endorses ***% compliance. We discussed: {Pharmacy options:24294}. Patient decided to: {US Pharmacy LEXN:17001} Patient agrees to Care Plan and Follow-up***.  Current Barriers:  . {pharmacybarriers:24917}  Pharmacist Clinical Goal(s):  [name] Over the next *** days, patient will {PHARMACYGOALCHOICES:24921} through collaboration with PharmD and provider.  . ***  Interventions: . 1:1 collaboration with [name], MD regarding development and update of comprehensive plan of care as evidenced by provider attestation and co-signature . Inter-disciplinary care team collaboration (see longitudinal plan of care) . Comprehensive medication review performed; medication list updated in electronic medical record  {CCM PHARMD DISEASE STATES:25130} Patient Goals/Self-Care Activities . Over the next *** days, patient will:  - {pharmacypatientgoals:24919}  Future Appointments  Date Time Provider [place]  12/09/2020  8:00 AM [name], MD [facility]  01/01/2021 10:00 AM [facility]  02/11/2021  1:30 PM Sherran Needs, NP [facility] None  08/17/2021  1:00 PM [facility] HEALTH COACH [facility]   Follow-up plan with Care Management Team: . CPA: *** . RPH: *** visit  Madelin Rear, Pharm.D., BCGP Clinical Pharmacist [place] [phone]

## 2020-12-09 ENCOUNTER — Encounter: Payer: Self-pay | Admitting: Family Medicine

## 2020-12-09 ENCOUNTER — Ambulatory Visit (INDEPENDENT_AMBULATORY_CARE_PROVIDER_SITE_OTHER): Payer: Medicare Other

## 2020-12-09 ENCOUNTER — Other Ambulatory Visit: Payer: Self-pay

## 2020-12-09 ENCOUNTER — Ambulatory Visit (INDEPENDENT_AMBULATORY_CARE_PROVIDER_SITE_OTHER): Payer: Medicare Other | Admitting: Family Medicine

## 2020-12-09 VITALS — BP 136/82 | HR 70 | Temp 97.5°F | Ht 72.0 in | Wt 246.6 lb

## 2020-12-09 DIAGNOSIS — E1169 Type 2 diabetes mellitus with other specified complication: Secondary | ICD-10-CM

## 2020-12-09 DIAGNOSIS — E785 Hyperlipidemia, unspecified: Secondary | ICD-10-CM

## 2020-12-09 DIAGNOSIS — C439 Malignant melanoma of skin, unspecified: Secondary | ICD-10-CM

## 2020-12-09 DIAGNOSIS — G4733 Obstructive sleep apnea (adult) (pediatric): Secondary | ICD-10-CM | POA: Insufficient documentation

## 2020-12-09 DIAGNOSIS — I152 Hypertension secondary to endocrine disorders: Secondary | ICD-10-CM

## 2020-12-09 DIAGNOSIS — Z0001 Encounter for general adult medical examination with abnormal findings: Secondary | ICD-10-CM

## 2020-12-09 DIAGNOSIS — K219 Gastro-esophageal reflux disease without esophagitis: Secondary | ICD-10-CM | POA: Diagnosis not present

## 2020-12-09 DIAGNOSIS — E1159 Type 2 diabetes mellitus with other circulatory complications: Secondary | ICD-10-CM | POA: Diagnosis not present

## 2020-12-09 DIAGNOSIS — I5042 Chronic combined systolic (congestive) and diastolic (congestive) heart failure: Secondary | ICD-10-CM

## 2020-12-09 DIAGNOSIS — I4891 Unspecified atrial fibrillation: Secondary | ICD-10-CM

## 2020-12-09 DIAGNOSIS — F325 Major depressive disorder, single episode, in full remission: Secondary | ICD-10-CM

## 2020-12-09 DIAGNOSIS — M48061 Spinal stenosis, lumbar region without neurogenic claudication: Secondary | ICD-10-CM

## 2020-12-09 LAB — COMPREHENSIVE METABOLIC PANEL
ALT: 22 U/L (ref 0–53)
AST: 24 U/L (ref 0–37)
Albumin: 4.1 g/dL (ref 3.5–5.2)
Alkaline Phosphatase: 55 U/L (ref 39–117)
BUN: 20 mg/dL (ref 6–23)
CO2: 27 mEq/L (ref 19–32)
Calcium: 8.9 mg/dL (ref 8.4–10.5)
Chloride: 105 mEq/L (ref 96–112)
Creatinine, Ser: 1.08 mg/dL (ref 0.40–1.50)
GFR: 64.1 mL/min (ref >60.00)
Glucose, Bld: 128 mg/dL — ABNORMAL HIGH (ref 70–99)
Potassium: 4.2 mEq/L (ref 3.5–5.1)
Sodium: 140 mEq/L (ref 135–145)
Total Bilirubin: 0.6 mg/dL (ref 0.2–1.2)
Total Protein: 6.5 g/dL (ref 6.0–8.3)

## 2020-12-09 LAB — LIPID PANEL
Cholesterol: 144 mg/dL (ref 0–200)
HDL: 31.5 mg/dL — ABNORMAL LOW (ref >39.00)
LDL Cholesterol: 81 mg/dL (ref 0–99)
NonHDL: 112.72
Total CHOL/HDL Ratio: 5
Triglycerides: 157 mg/dL — ABNORMAL HIGH (ref 0.0–149.0)
VLDL: 31.4 mg/dL (ref 0.0–40.0)

## 2020-12-09 LAB — HEMOGLOBIN A1C: Hgb A1c MFr Bld: 6.5 % (ref 4.6–6.5)

## 2020-12-09 LAB — CBC
HCT: 48.5 % (ref 39.0–52.0)
Hemoglobin: 15.9 g/dL (ref 13.0–17.0)
MCHC: 32.7 g/dL (ref 30.0–36.0)
MCV: 86.6 fl (ref 78.0–100.0)
Platelets: 176 10*3/uL (ref 150.0–400.0)
RBC: 5.6 Mil/uL (ref 4.22–5.81)
RDW: 14.8 % (ref 11.5–15.5)
WBC: 5.7 10*3/uL (ref 4.0–10.5)

## 2020-12-09 LAB — VITAMIN B12: Vitamin B-12: 293 pg/mL (ref 211–911)

## 2020-12-09 LAB — TSH: TSH: 2.07 u[IU]/mL (ref 0.35–4.50)

## 2020-12-09 MED ORDER — GABAPENTIN 100 MG PO CAPS: 100.0000 mg | ORAL_CAPSULE | Freq: Every day | ORAL | 3 refills | Status: DC

## 2020-12-09 NOTE — Assessment & Plan Note (Signed)
Managed by cardiology.  Has not yet gotten CPAP machine.  He is having some fatigue throughout the day which is likely multifactorial.

## 2020-12-09 NOTE — Assessment & Plan Note (Signed)
Also cardiology.  No signs of volume overload today. °

## 2020-12-09 NOTE — Patient Instructions (Addendum)
Brent Taylor,  Thank you for taking the time to review your medications with me today.  I have included our care plan/goals in the following pages. Please review and call me at [phone] with any questions!  Thanks! Brent Taylor, Pharm.D., BCGP Clinical Pharmacist [facility] [place] [phone]  Patient Care Plan: Heart Failure (Adult)    Problem Identified: Heart Failure Management   Priority: Medium    Long-Range Goal: Symptom Exacerbation Prevented or Minimized   Start Date: 12/04/2020  Expected End Date: 05/09/2021  Priority: Medium  Note:   Current Barriers:  [name] Knowledge deficits related to basic heart failure pathophysiology and self care management as evidenced by patient not weighing himself daily.  Patient does acknowledge history of congestive heart failure but states no one has told him to weigh himself daily.  Denies current shortness of breath or swelling in lower extremities. Nurse Case Manager Clinical Goal(s):   Over the next 30 days, patient will weigh self daily and record  Over the next 120 days, patient will verbalize understanding of Heart Failure Action Plan and when to call doctor Interventions:  . Collaboration with Brent Barrack, MD regarding development and update of comprehensive plan of care as evidenced by provider attestation and co-signature . Inter-disciplinary care team collaboration (see longitudinal plan of care) . Basic overview and discussion of pathophysiology of Heart Failure . Provided verbal education on low sodium heart healthy carbohydrate modified diabetic diet . Reviewed signs and symptoms of heart failure and discussed Heart Failure Action Plan  . Discussed importance of daily weight and when to call provider based on weight . Reviewed role of diuretics in prevention of fluid overload . Sending EMMI Heart Failure educational video  . Encouraged to continue to monitor heart rate  for increase rate and being out of rhythm  . Reviewed medications and encouraged medication compliance . Reviewed current Hgb A1C and congratulated patient on maintaining glycemic controlled levels and encouraged continue daily monitoring of blood sugars Patient Goals/Self-Care Activities Over the next 30 days, patient will:  . Call office if I gain more than 2 pounds in one day or 5 pounds in one week . Weigh myself daily, at same time each day and record in log . Try stick with low salt heart healthy carbohydrate modified diabetic diet . Continue to check blood sugars daily . Monitor yourself for rapid irregular heart rate, taking prn medication if indicated and notifying provider Follow Up Plan: The care management team will reach out to the patient again over the next 45 business days.        Patient Care Plan: Chronic Pain (Adult)    Problem Identified: Increase in episodes of chronic pain and feelings of being tired more   Priority: High    Long-Range Goal: Pain Management Plan Developed and Energy Increased   Start Date: 12/04/2020  Expected End Date: 05/09/2021  Priority: High  Note:   Current Barriers:   Ineffective Self Health Maintenance of chronic pain and energy level as evidenced by increased episodes of pain flares and constant feelings of being tired even after good nights sleep.  Patient reports he has had increase in pain flares requiring taking pain medication to help with relief.  Also states he constantly feels tired and not sure why.  Is asking if some of his medications could be causing the feelings of tiredness.  Also states he is unsure why his pain has increased and  is wondering if it is just related to getting older.  Knowledge Deficits related to short term plan for care coordination needs and long term plans for chronic disease management needs Clinical Goal(s):  [name] Collaboration with Brent Barrack, MD regarding development and update of comprehensive plan  of care as evidenced by provider attestation and co-signature . Inter-disciplinary care team collaboration (see longitudinal plan of care)  Over the next 90 days, patient will work with care management team to address care coordination and chronic disease management needs related to increased pain and feelings of tiredness   Interventions:   Evaluation of current treatment plan related to chronic pain self-management and patient's adherence to plan as established by provider.  Discussed plans with patient for ongoing care management follow up and provided patient with direct contact information for care management team  Reviewed medications and encouraged medication compliance; discussed limited to no use of Ibuprofen while on Eliquis  Discussed CCM Pharmacist and medication reconciliation, sending message to CCM Pharmacist to contact patient to review medication list and possible side effects  Encouraged continued diversional activities like prayer and meditation  Encouraged patient to discuss pain and tiredness with primary care provider with hopes to develop treatment plan  Encouraged to monitor pain triggers and what helps treat/manage pain  Reviewed sleep pattern and encouraged at least 6-8 hours nightly sleep  Sending education on Fatigue Patient Goals/Self Care Activities:  Over the next 30 days, patient will:  . Develop a personal pain management plan . Plan exercise or activity when pain is best controlled . Prioritize tasks for the day . Track times pain is worst and when it is best . Track what makes the pain worse and what makes it better  . Discuss pain with primary care provider . Eat healthy . Limit daytime naps . Practice relaxation or meditation daily  . Review medications with CCM Pharmacist to discuss possible side effects  Follow Up Plan: The care management team will reach out to the patient again over the next 45 business days.       Patient Care Plan:  CCM Pharmacy Care Plan    Problem Identified: Hypertension, Hyperlipidemia, Diabetes, Atrial Fibrillation, Heart Failure, GERD and Depression     Long-Range Goal: Disease Management   Start Date: 12/09/2020  Expected End Date: 12/09/2021  This Visit's Progress: On track  Priority: High  Note:    Medication Assistance: None required.  Patient affirms current coverage meets needs. Patient's preferred pharmacy is:  [facility], [place] [address] Phone: [phone] Fax: [phone]  Patient decided to: Continue current medication management strategy Patient agrees to Care Plan and Follow-up.  Current Barriers:  . Focus on improving physical activity  Pharmacist Clinical Goal(s):  [name] Over the next 365 days, patient will verbalize ability to afford treatment regimen . achieve adherence to monitoring guidelines and medication adherence to achieve therapeutic efficacy . contact provider office for questions/concerns as evidenced notation of same in electronic health record through collaboration with PharmD and provider.   Interventions: . 1:1 collaboration with Brent Barrack, MD regarding development and update of comprehensive plan of care as evidenced by provider attestation and co-signature . Inter-disciplinary care team collaboration (see longitudinal plan of care) . Comprehensive medication review performed; medication list updated in electronic medical record  Hypertension (BP goal <140/90)/CHF -Controlled  -Echo 10/15/20: EF up to 50-55% from 40-45% in 2017. Mod LVH. -  Current treatment: . Diltiazem 30 mg once daily as needed Laroy Apple) . Metoprolol tartrate 12.5 mg twice daily (Dr Rayann Heman) . Quinapril 20 mg once daily at bedtime (PCP) -Medications previously tried: quinapril 40 mg->20 mg (current), amlodipine 5 mg->10 mg  -Previously on HCTZ 25 mg once daily (no edema reported during OVs pst  several years) -Current home readings: <140/90 -Current dietary habits: see DM -Current exercise habits: see DM -Denies hypotensive/hypertensive symptoms -Educated on BP goals and benefits of medications for prevention of heart attack, stroke and kidney damage; Daily salt intake goal < 2300 mg; Exercise goal of 150 minutes per week; -Counseled to monitor BP at home, document, and provide log at future appointments -Counseled on diet and exercise extensively Recommended to continue current medication  Hyperlipidemia: (LDL goal < 100) -Controlled -HLD HTN DM HF, CAD? -Current treatment: . Pravastatin 40 mg once daily  -Medications previously tried: pitavastatin 4 mg, pravastatin 20 mg-> current pravastatin 40 mg  -Current dietary patterns: see DM -Current exercise habits: see DM -Educated on Cholesterol goals;  Benefits of statin for ASCVD risk reduction; Importance of limiting foods high in cholesterol; Exercise goal of 150 minutes per week; -Counseled on diet and exercise extensively Recommended to continue current medication  Discussed potential side effects of statins and other medications, was starting on gabapentin by PCP today - we discussed pain management and side effects at length.   Diabetes (A1c goal <6.5%) -Controlled -Current medications: . n/a -Medications previously tried: metformin 500 mg IR once daily with breakfast (2013-2020) -Current home glucose readings . fasting glucose: n/a . post prandial glucose: n/a -Denies hypoglycemic/hyperglycemic symptoms -Current meal patterns:  . breakfast: biscuit, orange, bananas, sausage, coffee   . lunch: snack or sandwich, chips   . dinner: vegetable, portion of meat . snacks: chips -Current exercise: yard work, goal to get back to [employer] as patient is feeling more comfortable getting out  -Educated onExercise goal of 150 minutes per week; Benefits of weight loss; -Counseled to check feet daily and get yearly eye  exams -Counseled on diet and exercise extensively  -Seen by PCP today 12/09/2020 - a1c is being updated  Atrial Fibrillation (Goal: prevent stroke and major bleeding) - Roderic Palau,  -Controlled -CHADSVASC: 6 -Current treatment: . Rate control: metoprolol tartrate 12.5 mg twice daily; diltiazem 30 mg IR as needed for heart rate >100 BPM . Anticoagulation: Eliquis 5 mg twice daily  -Medications previously tried: diltiazem 180 mg capsule 24 hr, metoprolol 25 mg BID->12.5 mg BID (current) -Home HR readings: 60s-70s home heart rate; denies symptoms or side effects  -Counseled on increased risk of stroke due to Afib and benefits of anticoagulation for stroke prevention; importance of adherence to anticoagulant exactly as prescribed; seeking medical attention after a head injury or if there is blood in the urine/stool; -Recommended to continue current medication  Patient Goals/Self-Care Activities . Over the next 365 days, patient will:  - take medications as prescribed target a minimum of 150 minutes of moderate intensity exercise weekly engage in dietary modifications by reducing foods high in fat and carbohydrates    Problem Identified: CHL AMB "PATIENT-SPECIFIC PROBLEM"       Brent Taylor was given information about Chronic Care Management services today including:  1. CCM service includes personalized support from designated clinical staff supervised by his physician, including individualized plan of care and coordination with other care providers 2. 24/7 contact phone numbers for assistance for urgent and routine care needs. 3. Standard insurance, coinsurance, copays and  deductibles apply for chronic care management only during months in which we provide at least 20 minutes of these services. Most insurances cover these services at 100%, however patients may be responsible for any copay, coinsurance and/or deductible if applicable. This service may help you avoid the need for more expensive  face-to-face services. 4. Only one practitioner may furnish and bill the service in a calendar month. 5. The patient may stop CCM services at any time (effective at the end of the month) by phone call to the office staff.  Patient agreed to services and verbal consent obtained.   The patient verbalized understanding of instructions provided today and agreed to receive a mailed copy of patient instruction and/or educational materials. Telephone follow up appointment with pharmacy team member scheduled for: See next appointment with "Care Management Staff" under "What's Next" below.   The patient verbalized understanding of instructions provided today and agreed to receive a MyChart copy of patient instruction and/or educational materials. Telephone follow up appointment with pharmacy team member scheduled for: See next appointment with "Care Management Staff" under "What's Next" below.   High Cholesterol  High cholesterol is a condition in which the blood has high levels of a Brenn, waxy substance similar to fat (cholesterol). The liver makes all the cholesterol that the body needs. The human body needs small amounts of cholesterol to help build cells. A person gets extra or excess cholesterol from the food that he or she eats. The blood carries cholesterol from the liver to the rest of the body. If you have high cholesterol, deposits (plaques) may build up on the walls of your arteries. Arteries are the blood vessels that carry blood away from your heart. These plaques make the arteries narrow and stiff. Cholesterol plaques increase your risk for heart attack and stroke. Work with your health care provider to keep your cholesterol levels in a healthy range. What increases the risk? The following factors may make you more likely to develop this condition:  Eating foods that are high in animal fat (saturated fat) or cholesterol.  Being overweight.  Not getting enough exercise.  A family  history of high cholesterol (familial hypercholesterolemia).  Use of tobacco products.  Having diabetes. What are the signs or symptoms? There are no symptoms of this condition. How is this diagnosed? This condition may be diagnosed based on the results of a blood test.  If you are older than 82 years of age, your health care provider may check your cholesterol levels every 4-6 years.  You may be checked more often if you have high cholesterol or other risk factors for heart disease. The blood test for cholesterol measures:  "Bad" cholesterol, or LDL cholesterol. This is the main type of cholesterol that causes heart disease. The desired level is less than 100 mg/dL.  "Good" cholesterol, or HDL cholesterol. HDL helps protect against heart disease by cleaning the arteries and carrying the LDL to the liver for processing. The desired level for HDL is 60 mg/dL or higher.  Triglycerides. These are fats that your body can store or burn for energy. The desired level is less than 150 mg/dL.  Total cholesterol. This measures the total amount of cholesterol in your blood and includes LDL, HDL, and triglycerides. The desired level is less than 200 mg/dL. How is this treated? This condition may be treated with:  Diet changes. You may be asked to eat foods that have more fiber and less saturated fats or added sugar.  Lifestyle  changes. These may include regular exercise, maintaining a healthy weight, and quitting use of tobacco products.  Medicines. These are given when diet and lifestyle changes have not worked. You may be prescribed a statin medicine to help lower your cholesterol levels. Follow these instructions at home: Eating and drinking  Eat a healthy, balanced diet. This diet includes: ? Daily servings of a variety of fresh, frozen, or canned fruits and vegetables. ? Daily servings of whole grain foods that are rich in fiber. ? Foods that are low in saturated fats and trans fats.  These include poultry and fish without skin, lean cuts of meat, and low-fat dairy products. ? A variety of fish, especially oily fish that contain omega-3 fatty acids. Aim to eat fish at least 2 times a week.  Avoid foods and drinks that have added sugar.  Use healthy cooking methods, such as roasting, grilling, broiling, baking, poaching, steaming, and stir-frying. Do not fry your food except for stir-frying.   Lifestyle  Get regular exercise. Aim to exercise for a total of 150 minutes a week. Increase your activity level by doing activities such as gardening, walking, and taking the stairs.  Do not use any products that contain nicotine or tobacco, such as cigarettes, e-cigarettes, and chewing tobacco. If you need help quitting, ask your health care provider.   General instructions  Take over-the-counter and prescription medicines only as told by your health care provider.  Keep all follow-up visits as told by your health care provider. This is important. Where to find more information  American Heart Association: www.heart.org  National Heart, Lung, and Blood Institute: https://wilson-eaton.com/ Contact a health care provider if:  You have trouble achieving or maintaining a healthy diet or weight.  You are starting an exercise program.  You are unable to stop smoking. Get help right away if:  You have chest pain.  You have trouble breathing.  You have any symptoms of a stroke. "BE FAST" is an easy way to remember the main warning signs of a stroke: ? B - Balance. Signs are dizziness, sudden trouble walking, or loss of balance. ? E - Eyes. Signs are trouble seeing or a sudden change in vision. ? F - Face. Signs are sudden weakness or numbness of the face, or the face or eyelid drooping on one side. ? A - Arms. Signs are weakness or numbness in an arm. This happens suddenly and usually on one side of the body. ? S - Speech. Signs are sudden trouble speaking, slurred speech, or trouble  understanding what people say. ? T - Time. Time to call emergency services. Write down what time symptoms started.  You have other signs of a stroke, such as: ? A sudden, severe headache with no known cause. ? Nausea or vomiting. ? Seizure. These symptoms may represent a serious problem that is an emergency. Do not wait to see if the symptoms will go away. Get medical help right away. Call your local emergency services (911 in the [place] Do not drive yourself to the hospital. Summary  Cholesterol plaques increase your risk for heart attack and stroke. Work with your health care provider to keep your cholesterol levels in a healthy range.  Eat a healthy, balanced diet, get regular exercise, and maintain a healthy weight.  Do not use any products that contain nicotine or tobacco, such as cigarettes, e-cigarettes, and chewing tobacco.  Get help right away if you have any symptoms of a stroke. This information is not intended  to replace advice given to you by your health care provider. Make sure you discuss any questions you have with your health care provider. Document Revised: 08/27/2019 Document Reviewed: 08/27/2019 Elsevier Patient Education  2021 [place].   Exercising to Lose Weight Exercise is structured, repetitive physical activity to improve fitness and health. Getting regular exercise is important for everyone. It is especially important if you are overweight. Being overweight increases your risk of heart disease, stroke, diabetes, high blood pressure, and several types of cancer. Reducing your calorie intake and exercising can help you lose weight. Exercise is usually categorized as moderate or vigorous intensity. To lose weight, most people need to do a certain amount of moderate-intensity or vigorous-intensity exercise each week. Moderate-intensity exercise Moderate-intensity exercise is any activity that gets you moving enough to burn at least three times more energy  (calories) than if you were sitting. Examples of moderate exercise include:  Walking a mile in 15 minutes.  Doing light yard work.  Biking at an easy pace. Most people should get at least 150 minutes (2 hours and 30 minutes) a week of moderate-intensity exercise to maintain their body weight.   Vigorous-intensity exercise Vigorous-intensity exercise is any activity that gets you moving enough to burn at least six times more calories than if you were sitting. When you exercise at this intensity, you should be working hard enough that you are not able to carry on a conversation. Examples of vigorous exercise include:  Running.  Playing a team sport, such as football, basketball, and soccer.  Jumping rope. Most people should get at least 75 minutes (1 hour and 15 minutes) a week of vigorous-intensity exercise to maintain their body weight. How can exercise affect me? When you exercise enough to burn more calories than you eat, you lose weight. Exercise also reduces body fat and builds muscle. The more muscle you have, the more calories you burn. Exercise also:  Improves mood.  Reduces stress and tension.  Improves your overall fitness, flexibility, and endurance.  Increases bone strength. The amount of exercise you need to lose weight depends on:  Your age.  The type of exercise.  Any health conditions you have.  Your overall physical ability. Talk to your health care provider about how much exercise you need and what types of activities are safe for you. What actions can I take to lose weight? Nutrition  Make changes to your diet as told by your health care provider or diet and nutrition specialist (dietitian). This may include: ? Eating fewer calories. ? Eating more protein. ? Eating less unhealthy fats. ? Eating a diet that includes fresh fruits and vegetables, whole grains, low-fat dairy products, and lean protein. ? Avoiding foods with added fat, salt, and  sugar.  Drink plenty of water while you exercise to prevent dehydration or heat stroke.   Activity  Choose an activity that you enjoy and set realistic goals. Your health care provider can help you make an exercise plan that works for you.  Exercise at a moderate or vigorous intensity most days of the week. ? The intensity of exercise may vary from person to person. You can tell how intense a workout is for you by paying attention to your breathing and heartbeat. Most people will notice their breathing and heartbeat get faster with more intense exercise.  Do resistance training twice each week, such as: ? Push-ups. ? Sit-ups. ? Lifting weights. ? Using resistance bands.  Getting short amounts of exercise can be  just as helpful as long structured periods of exercise. If you have trouble finding time to exercise, try to include exercise in your daily routine. ? Get up, stretch, and walk around every 30 minutes throughout the day. ? Go for a walk during your lunch break. ? Park your car farther away from your destination. ? If you take public transportation, get off one stop early and walk the rest of the way. ? Make phone calls while standing up and walking around. ? Take the stairs instead of elevators or escalators.  Wear comfortable clothes and shoes with good support.  Do not exercise so much that you hurt yourself, feel dizzy, or get very short of breath. Where to find more information  U.S. Department of Health and Human Services: [employer].at  Centers for Disease Control and Prevention (CDC): http://www.wolf.info/ Contact a health care provider:  Before starting a new exercise program.  If you have questions or concerns about your weight.  If you have a medical problem that keeps you from exercising. Get help right away if you have any of the following while exercising:  Injury.  Dizziness.  Difficulty breathing or shortness of breath that does not go away when you stop  exercising.  Chest pain.  Rapid heartbeat. Summary  Being overweight increases your risk of heart disease, stroke, diabetes, high blood pressure, and several types of cancer.  Losing weight happens when you burn more calories than you eat.  Reducing the amount of calories you eat in addition to getting regular moderate or vigorous exercise each week helps you lose weight. This information is not intended to replace advice given to you by your health care provider. Make sure you discuss any questions you have with your health care provider. Document Revised: 01/24/2020 Document Reviewed: 01/24/2020 Elsevier Patient Education  2021 [employer].

## 2020-12-09 NOTE — Assessment & Plan Note (Signed)
Stable on Prozac 40 mg daily. 

## 2020-12-09 NOTE — Assessment & Plan Note (Signed)
Check labs today.  He is on pravastatin 40 mg daily.

## 2020-12-09 NOTE — Progress Notes (Signed)
 Chief Complaint:  Brent Taylor is a 80 y.o. male who presents today for his annual comprehensive physical exam.    Assessment/Plan:  Chronic Problems Addressed Today: Hypertension associated with diabetes (HCC) At goal.  Continue metoprolol tartrate 12.5 mg twice daily and quinapril 20 mg daily.  Check labs today.  Chronic combined systolic and diastolic heart failure (HCC) Also cardiology.  No signs of volume overload today.  Melanoma Follows with dermatology.  GERD (gastroesophageal reflux disease) Continue Protonix 20 mg daily.  Check B12.  Major depression in remission (HCC) Stable on Prozac 40 mg daily.  Dyslipidemia associated with type 2 diabetes mellitus (HCC) Check labs today.  He is on pravastatin 40 mg daily.  OSA (obstructive sleep apnea) Managed by cardiology.  Has not yet gotten CPAP machine.  He is having some fatigue throughout the day which is likely multifactorial.  Spinal stenosis of lumbar region Recently had epidural steroid injection however did not react well with it.  He does not think he wants to have another one done.  We will start gabapentin 100 mg nightly and he will check with me in a couple weeks to let me know how it is working.  Type 2 diabetes mellitus with vascular disease (HCC) Check A1c today.  Fasting sugars usually in the low 100s.  Not currently on any medications.  Fatigue Likely multifactorial in setting of untreated sleep apnea.  Will check labs today to rule out other possible causes.  Body mass index is 33.44 kg/m. / Obese  BMI Metric Follow Up - 12/09/20 0803      BMI Metric Follow Up-Please document annually   BMI Metric Follow Up Education provided            Preventative Healthcare: Flu vaccine deferred. UTD on other vaccines and screenings.   Patient Counseling(The following topics were reviewed and/or handout was given):  -Nutrition: Stressed importance of moderation in sodium/caffeine intake, saturated fat and  cholesterol, caloric balance, sufficient intake of fresh fruits, vegetables, and fiber.  -Stressed the importance of regular exercise.   -Substance Abuse: Discussed cessation/primary prevention of tobacco, alcohol, or other drug use; driving or other dangerous activities under the influence; availability of treatment for abuse.   -Injury prevention: Discussed safety belts, safety helmets, smoke detector, smoking near bedding or upholstery.   -Sexuality: Discussed sexually transmitted diseases, partner selection, use of condoms, avoidance of unintended pregnancy and contraceptive alternatives.   -Dental health: Discussed importance of regular tooth brushing, flossing, and dental visits.  -Health maintenance and immunizations reviewed. Please refer to Health maintenance section.  Return to care in 1 year for next preventative visit.     Subjective:  HPI:  He has no acute complaints today.   Lifestyle Diet: Cutting down on portions.  Exercise: Limited due to back pain.   Depression screen PHQ 2/9 12/04/2020  Decreased Interest 0  Down, Depressed, Hopeless 0  PHQ - 2 Score 0  Altered sleeping -  Tired, decreased energy -  Change in appetite -  Feeling bad or failure about yourself  -  Trouble concentrating -  Moving slowly or fidgety/restless -  Suicidal thoughts -  PHQ-9 Score -  Difficult doing work/chores -    There are no preventive care reminders to display for this patient.   ROS: Per HPI, otherwise a complete review of systems was negative.   PMH:  The following were reviewed and entered/updated in epic: Past Medical History:  Diagnosis Date  . Cancer (HCC)      hx - melanoma  . Chicken pox   . Colon polyps   . Depression    No need for therapy.  Improved  . Diabetes ([place])   . Diverticula, colon   . Hemorrhoids   . Hyperlipidemia   . Hypertension   . Sepsis ([place])   . Tinnitus   . UTI (lower urinary tract infection)    Patient Active Problem List   Diagnosis  Date Noted  . OSA (obstructive sleep apnea) 12/09/2020  . Sensorineural hearing loss (SNHL) of left ear with restricted hearing of right ear 08/19/2020  . Subjective tinnitus, bilateral 08/19/2020  . Atrial fibrillation ([place]) 06/10/2020  . Degeneration of lumbar intervertebral disc 03/31/2020  . Low back pain 03/11/2020  . Spinal stenosis of lumbar region 08/29/2019  . Type 2 diabetes mellitus with vascular disease ([place]) 10/20/2016  . Hypertension associated with diabetes ([place]) 11/20/2015  . Chronic combined systolic and diastolic heart failure ([place])   . Coronary artery disease 12/10/2011  . Dyslipidemia associated with type 2 diabetes mellitus ([place]) 12/10/2011  . Major depression in remission ([place]) 12/10/2011  . GERD (gastroesophageal reflux disease) 12/10/2011  . Hx of colonic polyps 12/10/2011  . Melanoma ([place]) 12/10/2011   Past Surgical History:  Procedure Laterality Date  . MELANOMA EXCISION     LEFT CHEST  . MELANOMA EXCISION      Family History  Problem Relation Age of Onset  . Heart Problems Mother        Skipping    Medications- reviewed and updated Current Outpatient Medications  Medication Sig Dispense Refill  . apixaban (ELIQUIS) 5 MG TABS tablet Take 1 tablet (5 mg total) by mouth 2 (two) times daily. 180 tablet 3  . Blood Glucose Monitoring Suppl (ONE TOUCH ULTRA 2) w/Device KIT Use daily to check blood sugar. 1 kit 0  . gabapentin (NEURONTIN) 100 MG capsule Take 1 capsule (100 mg total) by mouth at bedtime. 90 capsule 3  . glucose blood test strip Use to test blood sugars daily. Dx: E11.9 100 strip 3  . Lancets (ONETOUCH ULTRASOFT) lancets Use to test blood sugars daily. Dx: E11.9 100 each 12  . metoprolol tartrate (LOPRESSOR) 25 MG tablet Take 0.5 tablets (12.5 mg total) by mouth 2 (two) times daily. 180 tablet 3  . pantoprazole (PROTONIX) 20 MG tablet Take 1 tablet (20 mg total) by mouth daily. 90 tablet 3  . pravastatin (PRAVACHOL) 40 MG tablet Take 40 mg by  mouth daily.    . quinapril (ACCUPRIL) 20 MG tablet Take 1 tablet (20 mg total) by mouth at bedtime. 30 tablet 5  . traMADol (ULTRAM) 50 MG tablet Take 100 mg by mouth every 12 (twelve) hours as needed for severe pain.    [name] diltiazem (CARDIZEM) 30 MG tablet Take 1 tablet every 4 hours AS NEEDED for afib heart rate >100 as long as top blood pressure >100. (Patient not taking: Reported on 12/09/2020) 45 tablet 1  . FLUoxetine (PROZAC) 40 MG capsule Take 1 capsule (40 mg total) by mouth daily. (Patient not taking: No sig reported) 90 capsule 3  . ibuprofen (ADVIL,MOTRIN) 200 MG tablet Take 600 mg by mouth as needed for moderate pain. (Patient not taking: Reported on 12/09/2020)    . sildenafil (VIAGRA) 100 MG tablet Take 1 tablet (100 mg total) by mouth daily as needed for erectile dysfunction. (Patient not taking: Reported on 12/09/2020) 10 tablet 0   No current facility-administered medications for this visit.    Allergies-reviewed and  updated Allergies  Allergen Reactions  . Lipitor [Atorvastatin Calcium]     Muscle ache    Social History   Socioeconomic History  . Marital status: Married    Spouse name: Not on file  . Number of children: 2  . Years of education: Not on file  . Highest education level: Not on file  Occupational History  . Occupation: Minister  . Occupation: retired  Tobacco Use  . Smoking status: Never Smoker  . Smokeless tobacco: Never Used  Substance and Sexual Activity  . Alcohol use: No  . Drug use: No  . Sexual activity: Not on file  Other Topics Concern  . Not on file  Social History Narrative   Lives with wife in [place].   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 Difficulty of Paying Living Expenses: Not hard at all  Food Insecurity: No Food Insecurity  . Worried About Running Out of Food in the Last Year: Never true  . Ran Out of Food in the Last Year:  Never true  Transportation Needs: No Transportation Needs  . Lack of Transportation (Medical): No  . Lack of Transportation (Non-Medical): No  Physical Activity: Sufficiently Active  . Days of Exercise per Week: 5 days  . Minutes of Exercise per Session: 30 min  Stress: No Stress Concern Present  . Feeling of Stress : Not at all  Social Connections: Moderately Integrated  . Frequency of Communication with Friends and Family: More than three times a week  . Frequency of Social Gatherings with Friends and Family: More than three times a week  . Attends Religious Services: More than 4 times per year  . Active Member of Clubs or Organizations: No  . Attends Club or Organization Meetings: Never  . Marital Status: Married        Objective:  Physical Exam: BP 136/82   Pulse 70   Temp (!) 97.5 F (36.4 C) (Temporal)   Ht 6' (1.829 m)   Wt 246 lb 9.6 oz (111.9 kg)   SpO2 97%   BMI 33.44 kg/m   Body mass index is 33.44 kg/m. Wt Readings from Last 3 Encounters:  12/09/20 246 lb 9.6 oz (111.9 kg)  10/13/20 245 lb 6.4 oz (111.3 kg)  10/09/20 243 lb 9.6 oz (110.5 kg)   Gen: NAD, resting comfortably HEENT: TMs normal bilaterally. OP clear. No thyromegaly noted.  CV: RRR with no murmurs appreciated Pulm: NWOB, CTAB with no crackles, wheezes, or rhonchi GI: Normal bowel sounds present. Soft, Nontender, Nondistended. MSK: no edema, cyanosis, or clubbing noted Skin: warm, dry Neuro: CN2-12 grossly intact. Strength 5/5 in upper and lower extremities. Reflexes symmetric and intact bilaterally.  Psych: Normal affect and thought content      M. , MD 12/09/2020 8:36 AM  

## 2020-12-09 NOTE — Progress Notes (Signed)
Chronic Care Management Pharmacy Note  12/09/2020 Name:  Brent Taylor MRN:  [id number] DOB:  Mar 17, 1941  Subjective: Brent Taylor is an 80 y.o. year old male who is a primary patient of [name], Algis Greenhouse, MD.  The CCM team was consulted for assistance with disease management and care coordination needs.   Engaged with patient by telephone for initial visit in response to provider referral for pharmacy case management and/or care coordination services.   Consent to Services:  The patient was given information about Chronic Care Management services, agreed to services, and gave verbal consent prior to initiation of services.  Please see initial visit note for detailed documentation.  Patient Care Team: [name], MD as PCP - General (Family Medicine) Madelin Rear, [facility] (Pharmacist) Madelin Rear, [facility] as Pharmacist (Pharmacist) Leona Singleton, RN as Case Manager  Recent office visits: 12/09/2020 (PCP): CPE; chronic labs +fatigue, gabapentin 100 mg QHS started for pain.   Objective: Lab Results  Component Value Date   CREATININE 1.20 02/19/2020   BUN 23 02/19/2020   GFR 58.07 (L) 02/19/2020   GFRNONAA 60 (L) 04/25/2016   GFRAA >60 04/25/2016   NA 137 02/19/2020   K 4.5 02/19/2020   CALCIUM 9.3 02/19/2020   CO2 25 02/19/2020   Lab Results  Component Value Date/Time   HGBA1C 6.2 (A) 10/13/2020 08:31 AM   HGBA1C 6.4 11/05/2019 08:53 AM   HGBA1C 6.1 (A) 05/22/2019 10:24 AM   HGBA1C 6.4 10/30/2018 09:12 AM   GFR 58.07 (L) 02/19/2020 01:29 PM   GFR 58.68 (L) 11/05/2019 08:53 AM   MICROALBUR 1.9 10/25/2017 09:25 AM   MICROALBUR 2.6 (H) 10/20/2016 09:27 AM    Last diabetic Eye exam:  Lab Results  Component Value Date/Time   HMDIABEYEEXA No Retinopathy 05/08/2018 12:24 PM    Last diabetic Foot exam:  Lab Results  Component Value Date/Time   HMDIABFOOTEX NORMAL 12/10/2011 12:00 AM    Lab Results  Component Value Date   CHOL 162 11/05/2019   HDL 34.80 (L) 11/05/2019    LDLCALC 87 11/05/2019   LDLDIRECT 142.7 12/10/2011   TRIG 197.0 (H) 11/05/2019   CHOLHDL 5 11/05/2019   Hepatic Function Latest Ref Rng & Units 02/19/2020 11/05/2019 10/30/2018  Total Protein 6.0 - 8.3 g/dL 6.9 6.3 6.8  Albumin 3.5 - 5.2 g/dL 4.3 4.1 4.3  AST 0 - 37 U/L $Remo'29 29 31  'gmQGe$ ALT 0 - 53 U/L 34 29 31  Alk Phosphatase 39 - 117 U/L 61 52 54  Total Bilirubin 0.2 - 1.2 mg/dL 0.7 0.7 0.7   Lab Results  Component Value Date/Time   TSH 2.42 02/19/2020 01:29 PM   TSH 2.52 11/05/2019 08:53 AM   FREET4 1.06 11/09/2015 10:15 PM   CBC Latest Ref Rng & Units 02/19/2020 11/05/2019 10/30/2018  WBC 4.0 - 10.5 K/uL 8.1 5.2 5.2  Hemoglobin 13.0 - 17.0 g/dL 16.6 15.8 15.9  Hematocrit 39.0 - 52.0 % 49.7 48.5 48.3  Platelets 150.0 - 400.0 K/uL 196.0 177.0 205.0   No results found for: VD25OH  Clinical ASCVD: No  The ASCVD Risk score Mikey Bussing DC Jr., et al., 2013) failed to calculate for the following reasons:   The 2013 ASCVD risk score is only valid for ages 6 to 31    No results found for: VITAMINB12   Echo 10/15/20: EF up to 50-55% from 40-45% in 2017. Mod LVH. Mild-mod aortic sclerosis without stenosis. Mild dilatation of aorta 40 mm, will need to be followed serially.  Depression screen [facility] 2/9 12/04/2020 10/13/2020 08/11/2020  Decreased Interest 0 1 0  Down, Depressed, Hopeless 0 0 0  PHQ - 2 Score 0 1 0  Altered sleeping - 0 -  Tired, decreased energy - 2 -  Change in appetite - 1 -  Feeling bad or failure about yourself  - 0 -  Trouble concentrating - 0 -  Moving slowly or fidgety/restless - 0 -  Suicidal thoughts - 0 -  PHQ-9 Score - 4 -  Difficult doing work/chores - Not difficult at all -    Social History   Tobacco Use  Smoking Status Never Smoker  Smokeless Tobacco Never Used   BP Readings from Last 3 Encounters:  12/09/20 136/82  10/13/20 (!) 142/81  10/09/20 (!) 168/94   Pulse Readings from Last 3 Encounters:  12/09/20 70  10/13/20 60  10/09/20 76   Wt Readings from  Last 3 Encounters:  12/09/20 246 lb 9.6 oz (111.9 kg)  10/13/20 245 lb 6.4 oz (111.3 kg)  10/09/20 243 lb 9.6 oz (110.5 kg)   Assessment/Interventions: Review of patient past medical history, allergies, medications, health status, including review of consultants reports, laboratory and other test data, was performed as part of comprehensive evaluation and provision of chronic care management services.   SDOH:  (Social Determinants of Health) assessments and interventions performed: Yes  CCM Care Plan Allergies  Allergen Reactions  . Lipitor [Atorvastatin Calcium]     Muscle ache   Medications Reviewed Today    Reviewed by Madelin Rear, [facility] (Pharmacist) on 12/09/20 at [id number]  Med List Status: <None>  Medication Order Taking? Sig Documenting Provider Last Dose Status Informant  apixaban (ELIQUIS) 5 MG TABS tablet [id number] No Take 1 tablet (5 mg total) by mouth 2 (two) times daily. [name], MD Taking Active   Blood Glucose Monitoring Suppl (ONE TOUCH ULTRA 2) w/Device KIT [id number] No Use daily to check blood sugar. [name], MD Taking Active   diltiazem (CARDIZEM) 30 MG tablet [id number] No Take 1 tablet every 4 hours AS NEEDED for afib heart rate >100 as long as top blood pressure >100.  Patient not taking: Reported on 12/09/2020   Sherran Needs, NP Not Taking Active   FLUoxetine (PROZAC) 40 MG capsule [id number] No Take 1 capsule (40 mg total) by mouth daily.  Patient not taking: No sig reported   [name], MD Not Taking Active            Med Note Hubert Azure D   Thu Dec 04, 2020  9:40 AM) Reports not taking at this time  gabapentin (NEURONTIN) 100 MG capsule [id number]  Take 1 capsule (100 mg total) by mouth at bedtime. [name], MD  Active   glucose blood test strip [id number] No Use to test blood sugars daily. Dx: E11.9 [name], MD Taking Active   ibuprofen (ADVIL,MOTRIN) 200 MG tablet [id number] No Take 600 mg by mouth as needed for moderate  pain.  Patient not taking: Reported on 12/09/2020   [provider] Not Taking Active Self  Lancets [facility] ULTRASOFT) lancets [id number] No Use to test blood sugars daily. Dx: E11.9 [name], MD Taking Active   metoprolol tartrate (LOPRESSOR) 25 MG tablet [id number] No Take 0.5 tablets (12.5 mg total) by mouth 2 (two) times daily. Thompson Grayer, MD Taking Active   pantoprazole (PROTONIX) 20 MG tablet [id number] No Take 1 tablet (20 mg total) by mouth daily. [name],  MD Taking Active   pravastatin (PRAVACHOL) 40 MG tablet [id number] No Take 40 mg by mouth daily. [provider] Taking Active   quinapril (ACCUPRIL) 20 MG tablet [id number] No Take 1 tablet (20 mg total) by mouth at bedtime. [name], MD Taking Active   sildenafil (VIAGRA) 100 MG tablet [id number] No Take 1 tablet (100 mg total) by mouth daily as needed for erectile dysfunction.  Patient not taking: Reported on 12/09/2020   Thompson Grayer, MD Not Taking Active   traMADol (ULTRAM) 50 MG tablet [id number] No Take 100 mg by mouth every 12 (twelve) hours as needed for severe pain. [provider] Taking Active Self         Patient Active Problem List   Diagnosis Date Noted  . OSA (obstructive sleep apnea) 12/09/2020  . Sensorineural hearing loss (SNHL) of left ear with restricted hearing of right ear 08/19/2020  . Subjective tinnitus, bilateral 08/19/2020  . Atrial fibrillation ([place]) 06/10/2020  . Degeneration of lumbar intervertebral disc 03/31/2020  . Low back pain 03/11/2020  . Spinal stenosis of lumbar region 08/29/2019  . Type 2 diabetes mellitus with vascular disease ([place]) 10/20/2016  . Hypertension associated with diabetes ([place]) 11/20/2015  . Chronic combined systolic and diastolic heart failure ([employer])   . Coronary artery disease 12/10/2011  . Dyslipidemia associated with type 2 diabetes mellitus ([place]) 12/10/2011  . Major depression in remission ([place]) 12/10/2011  . GERD  (gastroesophageal reflux disease) 12/10/2011  . Hx of colonic polyps 12/10/2011  . Melanoma ([place]) 12/10/2011   Immunization History  Administered Date(s) Administered  . Influenza Split 07/14/2012  . Influenza, High Dose Seasonal PF 07/28/2015, 09/14/2016, 10/07/2017, 07/11/2018, 05/31/2019  . Influenza,inj,Quad PF,6+ Mos 07/16/2013, 07/25/2014  . Influenza,inj,quad, With Preservative 07/16/2019  . PFIZER(Purple Top)SARS-COV-2 Vaccination 11/19/2019, 12/14/2019, 08/22/2020  . Pneumococcal Conjugate-13 03/13/2014  . Pneumococcal Polysaccharide-23 07/16/2013  . Tetanus 03/13/2014   Conditions to be addressed/monitored:  Hypertension, Hyperlipidemia, Diabetes, Atrial Fibrillation, Heart Failure, GERD and Depression Care Plan : [place]  Updates made by Madelin Rear, [facility] since 12/09/2020 12:00 AM    Problem: Hypertension, Hyperlipidemia, Diabetes, Atrial Fibrillation, Heart Failure, GERD and Depression     Long-Range Goal: Disease Management   Start Date: 12/09/2020  Expected End Date: 12/09/2021  This Visit's Progress: On track  Priority: High  Note:    Medication Assistance: None required.  Patient affirms current coverage meets needs. Patient's preferred pharmacy is:  [facility], [address] [place] [address] Phone: [phone] Fax: [phone]  Patient decided to: Continue current medication management strategy Patient agrees to Care Plan and Follow-up.  Current Barriers:  . Focus on improving physical activity  Pharmacist Clinical Goal(s):  [name] Over the next 365 days, patient will verbalize ability to afford treatment regimen . achieve adherence to monitoring guidelines and medication adherence to achieve therapeutic efficacy . contact provider office for questions/concerns as evidenced notation of same in electronic health record through collaboration with PharmD and provider.    Interventions: . 1:1 collaboration with [name], MD regarding development and update of comprehensive plan of care as evidenced by provider attestation and co-signature . Inter-disciplinary care team collaboration (see longitudinal plan of care) . Comprehensive medication review performed; medication list updated in electronic medical record  Hypertension (BP goal <140/90)/CHF -Controlled  -Echo 10/15/20: EF up to 50-55% from 40-45% in 2017. Mod LVH. -Current treatment: . Diltiazem 30 mg once  daily as needed Laroy Apple) . Metoprolol tartrate 12.5 mg twice daily (Dr Rayann Heman) . Quinapril 20 mg once daily at bedtime (PCP) -Medications previously tried: quinapril 40 mg->20 mg (current), amlodipine 5 mg->10 mg  -Previously on HCTZ 25 mg once daily (no edema reported during OVs pst several years) -Current home readings: <140/90 -Current dietary habits: see DM -Current exercise habits: see DM -Denies hypotensive/hypertensive symptoms -Educated on BP goals and benefits of medications for prevention of heart attack, stroke and kidney damage; Daily salt intake goal < 2300 mg; Exercise goal of 150 minutes per week; -Counseled to monitor BP at home, document, and provide log at future appointments -Counseled on diet and exercise extensively Recommended to continue current medication  Hyperlipidemia: (LDL goal < 100) -Controlled -HLD HTN DM HF, CAD? -Current treatment: . Pravastatin 40 mg once daily  -Medications previously tried: pitavastatin 4 mg, pravastatin 20 mg-> current pravastatin 40 mg  -Current dietary patterns: see DM -Current exercise habits: see DM -Educated on Cholesterol goals;  Benefits of statin for ASCVD risk reduction; Importance of limiting foods high in cholesterol; Exercise goal of 150 minutes per week; -Counseled on diet and exercise extensively Recommended to continue current medication  Discussed potential side effects of statins and other medications,  was starting on gabapentin by PCP today - we discussed pain management and side effects at length.   Diabetes (A1c goal <6.5%) -Controlled -Current medications: . n/a -Medications previously tried: metformin 500 mg IR once daily with breakfast (2013-2020) -Current home glucose readings . fasting glucose: n/a . post prandial glucose: n/a -Denies hypoglycemic/hyperglycemic symptoms -Current meal patterns:  . breakfast: biscuit, orange, bananas, sausage, coffee   . lunch: snack or sandwich, chips   . dinner: vegetable, portion of meat . snacks: chips -Current exercise: yard work, goal to get back to [employer] as patient is feeling more comfortable getting out  -Educated onExercise goal of 150 minutes per week; Benefits of weight loss; -Counseled to check feet daily and get yearly eye exams -Counseled on diet and exercise extensively  -Seen by PCP today 12/09/2020 - a1c is being updated  Atrial Fibrillation (Goal: prevent stroke and major bleeding) - Roderic Palau,  -Controlled -CHADSVASC: 6 -Current treatment: . Rate control: metoprolol tartrate 12.5 mg twice daily; diltiazem 30 mg IR as needed for heart rate >100 BPM . Anticoagulation: Eliquis 5 mg twice daily  -Medications previously tried: diltiazem 180 mg capsule 24 hr, metoprolol 25 mg BID->12.5 mg BID (current) -Home HR readings: 60s-70s home heart rate; denies symptoms or side effects  -Counseled on increased risk of stroke due to Afib and benefits of anticoagulation for stroke prevention; importance of adherence to anticoagulant exactly as prescribed; seeking medical attention after a head injury or if there is blood in the urine/stool; -Recommended to continue current medication  Patient Goals/Self-Care Activities . Over the next 365 days, patient will:  - take medications as prescribed target a minimum of 150 minutes of moderate intensity exercise weekly engage in dietary modifications by reducing foods high in fat and  carbohydrates    Problem: CHL AMB "PATIENT-SPECIFIC PROBLEM"       Medication Assistance: None required.  Patient affirms current coverage meets needs. Patient's preferred pharmacy is:  [facility], [address] [place] [address] Phone: [phone] Fax: [phone]  Patient decided to: Continue current medication management strategy Patient agrees to Care Plan and Follow-up.  Current Barriers:  . Focus on improving physical  activity  Pharmacist Clinical Goal(s):  [name] Over the next 365 days, patient will verbalize ability to afford treatment regimen . achieve adherence to monitoring guidelines and medication adherence to achieve therapeutic efficacy . contact provider office for questions/concerns as evidenced notation of same in electronic health record through collaboration with PharmD and provider.   Interventions: . 1:1 collaboration with [name], MD regarding development and update of comprehensive plan of care as evidenced by provider attestation and co-signature . Inter-disciplinary care team collaboration (see longitudinal plan of care) . Comprehensive medication review performed; medication list updated in electronic medical record  Hypertension (BP goal <140/90)/CHF -Controlled  -Echo 10/15/20: EF up to 50-55% from 40-45% in 2017. Mod LVH. -Current treatment: . Diltiazem 30 mg once daily as needed Laroy Apple) . Metoprolol tartrate 12.5 mg twice daily (Dr Rayann Heman) . Quinapril 20 mg once daily at bedtime (PCP) -Medications previously tried: quinapril 40 mg->20 mg (current), amlodipine 5 mg->10 mg  -Previously on HCTZ 25 mg once daily (no edema reported during OVs pst several years) -Current home readings: <140/90 -Current dietary habits: see DM -Current exercise habits: see DM -Denies hypotensive/hypertensive symptoms -Educated on BP goals and benefits of medications for prevention of heart  attack, stroke and kidney damage; Daily salt intake goal < 2300 mg; Exercise goal of 150 minutes per week; -Counseled to monitor BP at home, document, and provide log at future appointments -Counseled on diet and exercise extensively Recommended to continue current medication  Hyperlipidemia: (LDL goal < 100) -Controlled -HLD HTN DM HF, CAD? -Current treatment: . Pravastatin 40 mg once daily  -Medications previously tried: pitavastatin 4 mg, pravastatin 20 mg-> current pravastatin 40 mg  -Current dietary patterns: see DM -Current exercise habits: see DM -Educated on Cholesterol goals;  Benefits of statin for ASCVD risk reduction; Importance of limiting foods high in cholesterol; Exercise goal of 150 minutes per week; -Counseled on diet and exercise extensively Recommended to continue current medication  Discussed potential side effects of statins and other medications, was starting on gabapentin by PCP today - we discussed pain management and side effects at length.   Diabetes (A1c goal <6.5%) -Controlled -Current medications: . n/a -Medications previously tried: metformin 500 mg IR once daily with breakfast (2013-2020) -Current home glucose readings . fasting glucose: n/a . post prandial glucose: n/a -Denies hypoglycemic/hyperglycemic symptoms -Current meal patterns:  . breakfast: biscuit, orange, bananas, sausage, coffee   . lunch: snack or sandwich, chips   . dinner: vegetable, portion of meat . snacks: chips -Current exercise: yard work, goal to get back to [employer] as patient is feeling more comfortable getting out  -Educated onExercise goal of 150 minutes per week; Benefits of weight loss; -Counseled to check feet daily and get yearly eye exams -Counseled on diet and exercise extensively  -Seen by PCP today 12/09/2020 - a1c is being updated  Atrial Fibrillation (Goal: prevent stroke and major bleeding) - Roderic Palau,  -Controlled -CHADSVASC: 6 -Current  treatment: . Rate control: metoprolol tartrate 12.5 mg twice daily; diltiazem 30 mg IR as needed for heart rate >100 BPM . Anticoagulation: Eliquis 5 mg twice daily  -Medications previously tried: diltiazem 180 mg capsule 24 hr, metoprolol 25 mg BID->12.5 mg BID (current) -Home HR readings: 60s-70s home heart rate; denies symptoms or side effects  -Counseled on increased risk of stroke due to Afib and benefits of anticoagulation for stroke prevention; importance of adherence to anticoagulant exactly as prescribed; seeking medical attention after a head injury or if there is  blood in the urine/stool; -Recommended to continue current medication  Patient Goals/Self-Care Activities . Over the next 365 days, patient will:  - take medications as prescribed target a minimum of 150 minutes of moderate intensity exercise weekly engage in dietary modifications by reducing foods high in fat and carbohydrates   Future Appointments  Date Time Provider [place]  01/01/2021 10:00 AM [facility] CARE [facility] PEC  02/11/2021  1:30 PM Sherran Needs, NP [facility] None  03/18/2021 11:00 AM [facility] CCM PHARMACIST [facility] PEC  08/17/2021  1:00 PM [facility] HEALTH COACH [facility] PEC   Follow-up plan with Care Management Team: . CPA: 2 month BP . RPH: 3 month telephone visit; review DM and consideration of metformin and/or SGLT2i, further discuss GERD and PPI use, had goal of getting back to [employer], Jolly.D., BCGP Clinical Pharmacist [place] [phone]

## 2020-12-09 NOTE — Patient Instructions (Signed)
It was very nice to see you today!  Please try the gabapentin at night.  You can also use glucosamine-chondroitin or turmeric.  Please send me a message in a few weeks to let me know how you are doing.  We will check blood work today including a B12 level.  I will probably see you back in 6 months depending on the results your blood work.  Please come back to see me sooner if needed.  Take care, Dr [name]  Please try these tips to maintain a healthy lifestyle:   Eat at least 3 REAL meals and 1-2 snacks per day.  Aim for no more than 5 hours between eating.  If you eat breakfast, please do so within one hour of getting up.    Each meal should contain half fruits/vegetables, one quarter protein, and one quarter carbs (no bigger than a computer mouse)   Cut down on sweet beverages. This includes juice, soda, and sweet tea.     Drink at least 1 glass of water with each meal and aim for at least 8 glasses per day   Exercise at least 150 minutes every week.    Preventive Care 23 Years and Older, Male Preventive care refers to lifestyle choices and visits with your health care provider that can promote health and wellness. This includes:  A yearly physical exam. This is also called an annual wellness visit.  Regular dental and eye exams.  Immunizations.  Screening for certain conditions.  Healthy lifestyle choices, such as: ? Eating a healthy diet. ? Getting regular exercise. ? Not using drugs or products that contain nicotine and tobacco. ? Limiting alcohol use. What can I expect for my preventive care visit? Physical exam Your health care provider will check your:  Height and weight. These may be used to calculate your BMI (body mass index). BMI is a measurement that tells if you are at a healthy weight.  Heart rate and blood pressure.  Body temperature.  Skin for abnormal spots. Counseling Your health care provider may ask you questions about your:  Past  medical problems.  Family's medical history.  Alcohol, tobacco, and drug use.  Emotional well-being.  Home life and relationship well-being.  Sexual activity.  Diet, exercise, and sleep habits.  History of falls.  Memory and ability to understand (cognition).  Work and work Statistician.  Access to firearms. What immunizations do I need? Vaccines are usually given at various ages, according to a schedule. Your health care provider will recommend vaccines for you based on your age, medical history, and lifestyle or other factors, such as travel or where you work.   What tests do I need? Blood tests  Lipid and cholesterol levels. These may be checked every 5 years, or more often depending on your overall health.  Hepatitis C test.  Hepatitis B test. Screening  Lung cancer screening. You may have this screening every year starting at age 56 if you have a 30-pack-year history of smoking and currently smoke or have quit within the past 15 years.  Colorectal cancer screening. ? All adults should have this screening starting at age 22 and continuing until age 52. ? Your health care provider may recommend screening at age 58 if you are at increased risk. ? You will have tests every 1-10 years, depending on your results and the type of screening test.  Prostate cancer screening. Recommendations will vary depending on your family history and other risks.  Genital exam to check  for testicular cancer or hernias.  Diabetes screening. ? This is done by checking your blood sugar (glucose) after you have not eaten for a while (fasting). ? You may have this done every 1-3 years.  Abdominal aortic aneurysm (AAA) screening. You may need this if you are a current or former smoker.  STD (sexually transmitted disease) testing, if you are at risk. Follow these instructions at home: Eating and drinking  Eat a diet that includes fresh fruits and vegetables, whole grains, lean protein, and  low-fat dairy products. Limit your intake of foods with high amounts of sugar, saturated fats, and salt.  Take vitamin and mineral supplements as recommended by your health care provider.  Do not drink alcohol if your health care provider tells you not to drink.  If you drink alcohol: ? Limit how much you have to 0-2 drinks a day. ? Be aware of how much alcohol is in your drink. In the [place] one drink equals one 12 oz bottle of beer (355 mL), one 5 oz glass of wine (148 mL), or one 1 oz glass of hard liquor (44 mL).   Lifestyle  Take daily care of your teeth and gums. Brush your teeth every morning and night with fluoride toothpaste. Floss one time each day.  Stay active. Exercise for at least 30 minutes 5 or more days each week.  Do not use any products that contain nicotine or tobacco, such as cigarettes, e-cigarettes, and chewing tobacco. If you need help quitting, ask your health care provider.  Do not use drugs.  If you are sexually active, practice safe sex. Use a condom or other form of protection to prevent STIs (sexually transmitted infections).  Talk with your health care provider about taking a low-dose aspirin or statin.  Find healthy ways to cope with stress, such as: ? Meditation, yoga, or listening to music. ? Journaling. ? Talking to a trusted person. ? Spending time with friends and family. Safety  Always wear your seat belt while driving or riding in a vehicle.  Do not drive: ? If you have been drinking alcohol. Do not ride with someone who has been drinking. ? When you are tired or distracted. ? While texting.  Wear a helmet and other protective equipment during sports activities.  If you have firearms in your house, make sure you follow all gun safety procedures. What's next?  Visit your health care provider once a year for an annual wellness visit.  Ask your health care provider how often you should have your eyes and teeth checked.  Stay up to date  on all vaccines. This information is not intended to replace advice given to you by your health care provider. Make sure you discuss any questions you have with your health care provider. Document Revised: 06/26/2019 Document Reviewed: 09/21/2018 Elsevier Patient Education  2021 [employer].

## 2020-12-09 NOTE — Assessment & Plan Note (Signed)
Check A1c today.  Fasting sugars usually in the low 100s.  Not currently on any medications.

## 2020-12-09 NOTE — Assessment & Plan Note (Signed)
Follows with dermatology 

## 2020-12-09 NOTE — Assessment & Plan Note (Signed)
At goal.  Continue metoprolol tartrate 12.5 mg twice daily and quinapril 20 mg daily.  Check labs today.

## 2020-12-09 NOTE — Assessment & Plan Note (Signed)
Recently had epidural steroid injection however did not react well with it.  He does not think he wants to have another one done.  We will start gabapentin 100 mg nightly and he will check with me in a couple weeks to let me know how it is working.

## 2020-12-09 NOTE — Assessment & Plan Note (Signed)
Continue Protonix 20 mg daily.  Check B12.

## 2020-12-10 NOTE — Progress Notes (Signed)
Please inform patient of the following:  Labs are all stable. No obvious explanation for his tagieu. I think it probably has a lot to do with his sleep apnea. Do not need to make any changes to his treatment plan at this time. Would like for him to keep up the good work with diet and exercise and we can see him back in 6 months to recheck blood sugar.

## 2021-01-01 ENCOUNTER — Ambulatory Visit: Payer: Medicare Other | Admitting: *Deleted

## 2021-01-01 DIAGNOSIS — E1169 Type 2 diabetes mellitus with other specified complication: Secondary | ICD-10-CM | POA: Diagnosis not present

## 2021-01-01 DIAGNOSIS — M48061 Spinal stenosis, lumbar region without neurogenic claudication: Secondary | ICD-10-CM

## 2021-01-01 DIAGNOSIS — I4891 Unspecified atrial fibrillation: Secondary | ICD-10-CM | POA: Diagnosis not present

## 2021-01-01 DIAGNOSIS — I152 Hypertension secondary to endocrine disorders: Secondary | ICD-10-CM | POA: Diagnosis not present

## 2021-01-01 DIAGNOSIS — E785 Hyperlipidemia, unspecified: Secondary | ICD-10-CM | POA: Diagnosis not present

## 2021-01-01 DIAGNOSIS — I5042 Chronic combined systolic (congestive) and diastolic (congestive) heart failure: Secondary | ICD-10-CM | POA: Diagnosis not present

## 2021-01-01 DIAGNOSIS — M5136 Other intervertebral disc degeneration, lumbar region: Secondary | ICD-10-CM

## 2021-01-01 DIAGNOSIS — M545 Low back pain, unspecified: Secondary | ICD-10-CM

## 2021-01-01 DIAGNOSIS — E1159 Type 2 diabetes mellitus with other circulatory complications: Secondary | ICD-10-CM

## 2021-01-01 NOTE — Chronic Care Management (AMB) (Signed)
Chronic Care Management   CCM RN Visit Note  01/01/2021 Name: Brent Taylor MRN: [id number] DOB: [birth year]  Subjective: Brent Taylor is a 80 y.o. year old male who is a primary care patient of [name], MD. The care management team was consulted for assistance with disease management and care coordination needs.    Engaged with patient by telephone for follow up visit in response to provider referral for case management and/or care coordination services.   Consent to Services:  The patient was given information about Chronic Care Management services, agreed to services, and gave verbal consent prior to initiation of services.  Please see initial visit note for detailed documentation.   Patient agreed to services and verbal consent obtained.   Assessment: Review of patient past medical history, allergies, medications, health status, including review of consultants reports, laboratory and other test data, was performed as part of comprehensive evaluation and provision of chronic care management services.   SDOH (Social Determinants of Health) assessments and interventions performed:    CCM Care Plan  Allergies  Allergen Reactions   Lipitor [Atorvastatin Calcium]     Muscle ache    Outpatient Encounter Medications as of 01/01/2021  Medication Sig Note   apixaban (ELIQUIS) 5 MG TABS tablet Take 1 tablet (5 mg total) by mouth 2 (two) times daily.    FLUoxetine (PROZAC) 40 MG capsule Take 1 capsule (40 mg total) by mouth daily. 01/01/2021: Reports he is taking   gabapentin (NEURONTIN) 100 MG capsule Take 1 capsule (100 mg total) by mouth at bedtime.    metoprolol tartrate (LOPRESSOR) 25 MG tablet Take 0.5 tablets (12.5 mg total) by mouth 2 (two) times daily.    pantoprazole (PROTONIX) 20 MG tablet Take 1 tablet (20 mg total) by mouth daily.    pravastatin (PRAVACHOL) 40 MG tablet Take 40 mg by mouth daily.    quinapril (ACCUPRIL) 20 MG tablet Take 1 tablet (20 mg total) by  mouth at bedtime.    traMADol (ULTRAM) 50 MG tablet Take 100 mg by mouth every 12 (twelve) hours as needed for severe pain.    Blood Glucose Monitoring Suppl (ONE TOUCH ULTRA 2) w/Device KIT Use daily to check blood sugar.    diltiazem (CARDIZEM) 30 MG tablet Take 1 tablet every 4 hours AS NEEDED for afib heart rate >100 as long as top blood pressure >100. (Patient not taking: Reported on 12/09/2020)    glucose blood test strip Use to test blood sugars daily. Dx: E11.9    ibuprofen (ADVIL,MOTRIN) 200 MG tablet Take 600 mg by mouth as needed for moderate pain. (Patient not taking: Reported on 12/09/2020)    Lancets (ONETOUCH ULTRASOFT) lancets Use to test blood sugars daily. Dx: E11.9    sildenafil (VIAGRA) 100 MG tablet Take 1 tablet (100 mg total) by mouth daily as needed for erectile dysfunction. (Patient not taking: Reported on 12/09/2020)    No facility-administered encounter medications on file as of 01/01/2021.    Patient Active Problem List   Diagnosis Date Noted   OSA (obstructive sleep apnea) 12/09/2020   Sensorineural hearing loss (SNHL) of left ear with restricted hearing of right ear 08/19/2020   Subjective tinnitus, bilateral 08/19/2020   Atrial fibrillation (HCC) 06/10/2020   Degeneration of lumbar intervertebral disc 03/31/2020   Low back pain 03/11/2020   Spinal stenosis of lumbar region 08/29/2019   Type 2 diabetes mellitus with vascular disease (HCC) 10/20/2016   Hypertension associated with diabetes (HCC) 11/20/2015   Chronic combined  systolic and diastolic heart failure (HCC)    Coronary artery disease 12/10/2011   Dyslipidemia associated with type 2 diabetes mellitus ([place]) 12/10/2011   Major depression in remission ([place]) 12/10/2011   GERD (gastroesophageal reflux disease) 12/10/2011   Hx of colonic polyps 12/10/2011   Melanoma ([place]) 12/10/2011    Conditions to be addressed/monitored:CHF and Chronic Pain  Care Plan : Heart Failure (Adult)   Updates made by Leona Singleton, RN since 01/01/2021 12:00 AM  Problem: Heart Failure Management   Priority: Medium  Long-Range Goal: Symptom Exacerbation Prevented or Minimized   Start Date: 12/04/2020  Expected End Date: 05/09/2021  This Visit's Progress: On track  Priority: Medium  Current Barriers:   Knowledge deficits related to basic heart failure pathophysiology and self care management as evidenced by patient not weighing himself daily.  Continues to report not weighing daily but occasionally.  States weights range 240's.  Denies current shortness of breath or swelling in lower extremities.  Discussed importance of daily weight monitoring to prevent heart failure exacerbation.  Does report no recent episode of rapid heart rates that he has noticed.  Continues to monitor blood pressures (120-140/70-80's) and blood sugars (120's) daily.  Denies any episodes of hypo/hyperglycemia or hypo/hypertension.  Latest Hgb A1C 6.5 on 12/09/20. Nurse Case Manager Clinical Goal(s):   Over the next 90 days, patient will weigh self daily and record  Over the next 120 days, patient will verbalize understanding of Heart Failure Action Plan and when to call doctor Interventions:   Collaboration with [name], MD regarding development and update of comprehensive plan of care as evidenced by provider attestation and co-signature  Inter-disciplinary care team collaboration (see longitudinal plan of care)  Basic overview and discussion of pathophysiology of Heart Failure  Provided verbal education on low sodium heart healthy carbohydrate modified diabetic diet  Reviewed signs and symptoms of heart failure and discussed Heart Failure Action Plan   Rediscussed importance of daily weight monitoring and when to call provider based on weight  Sending EMMI Heart Failure  and daily weight monitoring educational video   Encouraged to continue to monitor heart rate for increase rate and being out of  rhythm   Reviewed medications and encouraged medication compliance  Sent message to [place] and CCM Pharmacist requesting medication refills per patient request  Reviewed current increase in Hgb A1C (6.5) and congratulated patient on maintaining glycemic controlled levels and encouraged continue daily monitoring of blood sugars  Encouraged continued blood pressure monitoring and notifying provider for sustained elevations Patient Goals/Self-Care Activities Over the next 90 days, patient will:   Call office if I gain more than 2 pounds in one day or 5 pounds in one week  Weigh myself daily, at same time each day and record in log  Try stick with low salt heart healthy carbohydrate modified diabetic diet  Continue to check blood sugars daily  Monitor yourself for rapid irregular heart rate, taking prn medication if indicated and notifying provider  Review EMMI videos related to heart failure and daily weighing  Follow Up Plan: The care management team will reach out to the patient again over the next 45 business days.     Care Plan : Chronic Pain (Adult)  Updates made by Leona Singleton, RN since 01/01/2021 12:00 AM  Problem: Increase in episodes of chronic pain and feelings of being tired more   Priority: High  Long-Range Goal: Pain Management Plan Developed and Energy Increased   Start Date: 12/04/2020  Expected End Date: 05/09/2021  This Visit's Progress: On track  Priority: High  Current Barriers:   Ineffective Self Health Maintenance of chronic pain and energy level as evidenced by increased episodes of pain flares and constant feelings of being tired even after good nights sleep.  At recent PCP visit patient was initiated on Gabapentin and patient reporting since staring this medication he has felt a little better (less pain and little more energy).  Still not back to his baseline but feels he is able to rest a little better.  Does continue to report he does not sleep with CPAP  with history of sleep apnea; but feels he sleeps ok.  Knowledge Deficits related to short term plan for care coordination needs and long term plans for chronic disease management needs Clinical Goal(s):   Collaboration with [name], MD regarding development and update of comprehensive plan of care as evidenced by provider attestation and co-signature  Inter-disciplinary care team collaboration (see longitudinal plan of care)  Over the next 120 days, patient will work with care management team to address care coordination and chronic disease management needs related to increased pain and feelings of tiredness   Interventions:   Evaluation of current treatment plan related to chronic pain self-management and patient's adherence to plan as established by provider.  Discussed plans with patient for ongoing care management follow up and provided patient with direct contact information for care management team  Reviewed medications and encouraged medication compliance; discussed limited to no use of Ibuprofen while on Eliquis  Encouraged to continue to work with  CCM Pharmacist for questions and concerns related to medication reconciliation and medication management  Encouraged continued diversional activities like prayer and meditation  Encouraged patient to increase activity as tolerated with reduced pain  Discussed and encouraged patient to develop pain management plan and set pain goal  Encouraged to monitor pain triggers and what helps treat/manage pain  Reviewed sleep pattern and encouraged at least 6-8 hours nightly sleep  Discussed history of sleep apnea and role of CPAP, encouraged patient to discuss with providers  Encouraged to review education on Fatigue previously sent Patient Goals/Self Care Activities:  Over the next 30 days, patient will:   Develop a personal pain management plan and establish pain goal  Plan exercise or activity when pain is best  controlled  Prioritize tasks for the day  Track times pain is worst and when it is best  Track what makes the pain worse and what makes it better   Take medications as prescribed  Eat healthy  Limit daytime naps  Practice relaxation or meditation daily   Review EMMI Sleep Apnea Video Follow Up Plan: The care management team will reach out to the patient again over the next 45 business days.      Plan:The care management team will reach out to the patient again over the next 45 business days.  Hubert Azure RN, MSN RN Care Management Coordinator  [place] [phone] [email or web address]

## 2021-01-01 NOTE — Patient Instructions (Signed)
Visit Information  Nice speaking with you today.  I have sent some educational videos to your MyChart as well.  Have also requested that Brent Taylor or Brent Taylor send in your medication refills for 90 day supply.  If you have any questions or concerns, please give me a call.   [name]  [phone]    PATIENT GOALS: Goals Addressed            This Visit's Progress   . (RNCM) Manage Chronic Pain   On track    Timeframe:  Long-Range Goal Priority:  Medium Start Date:  12/04/20                           Expected End Date:   05/09/21                    Follow Up Date 02/12/21    . Develop a personal pain management plan and establish pain goal . Plan exercise or activity when pain is best controlled . Prioritize tasks for the day . Track times pain is worst and when it is best . Track what makes the pain worse and what makes it better  . Take medications as prescribed   Why is this important?    Day-to-day life can be hard when you have chronic pain.   Pain medicine is just one piece of the treatment puzzle.   You can try these action steps to help you manage your pain.    Notes:    [name] (RNCM) Manage My Fatigue (Tiredness-Chronic Pain)   On track    Timeframe:  Long-Range Goal Priority:  High Start Date:   12/04/20                          Expected End Date:   05/09/21                    Follow Up Date 02/12/21    . Eat healthy . Limit daytime naps . Practice relaxation or meditation daily  . Review EMMI Sleep Apnea Video   Why is this important?    Chronic pain and fatigue often go together.   For many, pain disturbs sleep, leaving you tired. Poor sleep together with chronic pain can drain you of energy.   There are healthy and positive ways to manage fatigue.    Notes:    [name] (RNCM) Track and Manage Fluids and Swelling-Heart Failure   Not on track    Timeframe:  Long-Range Goal Priority:  Medium Start Date:  12/04/20                           Expected End Date:  05/09/21                      Follow Up Date  02/12/21    . Call office if I gain more than 2 pounds in one day or 5 pounds in one week . Weigh myself daily, at same time each day and record in log . Try stick with low salt heart healthy carbohydrate modified diabetic diet . Continue to check blood sugars daily . Monitor yourself for rapid irregular heart rate, taking prn medication if indicated and notifying provider . Review EMMI videos related to heart failure and daily weighing   Why is this important?  It is important to check your weight daily and watch how much salt and liquids you have.   It will help you to manage your heart failure.    Notes:     Patient verbalizes understanding of instructions provided today and agrees to view in [place].   The care management team will reach out to the patient again over the next 45 business days.   Hubert Azure RN, MSN RN Care Management Coordinator  [place] [phone] [email or web address]

## 2021-01-05 ENCOUNTER — Encounter: Payer: Self-pay | Admitting: Family Medicine

## 2021-01-05 ENCOUNTER — Other Ambulatory Visit: Payer: Self-pay | Admitting: Family Medicine

## 2021-01-05 DIAGNOSIS — M5430 Sciatica, unspecified side: Secondary | ICD-10-CM | POA: Diagnosis not present

## 2021-01-05 DIAGNOSIS — M9901 Segmental and somatic dysfunction of cervical region: Secondary | ICD-10-CM | POA: Diagnosis not present

## 2021-01-05 DIAGNOSIS — M5440 Lumbago with sciatica, unspecified side: Secondary | ICD-10-CM | POA: Diagnosis not present

## 2021-01-05 DIAGNOSIS — M9903 Segmental and somatic dysfunction of lumbar region: Secondary | ICD-10-CM | POA: Diagnosis not present

## 2021-01-06 ENCOUNTER — Other Ambulatory Visit: Payer: Self-pay | Admitting: *Deleted

## 2021-01-06 MED ORDER — APIXABAN 5 MG PO TABS
5.0000 mg | ORAL_TABLET | Freq: Two times a day (BID) | ORAL | 1 refills | Status: DC
Start: 2021-01-06 — End: 2021-04-16

## 2021-01-06 MED ORDER — QUINAPRIL HCL 20 MG PO TABS
20.0000 mg | ORAL_TABLET | Freq: Every day | ORAL | 1 refills | Status: DC
Start: 2021-01-06 — End: 2021-04-15

## 2021-01-06 MED ORDER — PRAVASTATIN SODIUM 40 MG PO TABS
40.0000 mg | ORAL_TABLET | Freq: Every day | ORAL | 1 refills | Status: DC
Start: 2021-01-06 — End: 2021-04-14

## 2021-01-06 NOTE — Telephone Encounter (Signed)
requesting refills last refills done by historical provider

## 2021-01-14 ENCOUNTER — Encounter: Payer: Self-pay | Admitting: Family Medicine

## 2021-01-15 NOTE — Telephone Encounter (Signed)
Can you help him with this information?

## 2021-01-16 DIAGNOSIS — G4733 Obstructive sleep apnea (adult) (pediatric): Secondary | ICD-10-CM | POA: Diagnosis not present

## 2021-02-02 DIAGNOSIS — M5430 Sciatica, unspecified side: Secondary | ICD-10-CM | POA: Diagnosis not present

## 2021-02-02 DIAGNOSIS — M9901 Segmental and somatic dysfunction of cervical region: Secondary | ICD-10-CM | POA: Diagnosis not present

## 2021-02-02 DIAGNOSIS — M9903 Segmental and somatic dysfunction of lumbar region: Secondary | ICD-10-CM | POA: Diagnosis not present

## 2021-02-02 DIAGNOSIS — M5440 Lumbago with sciatica, unspecified side: Secondary | ICD-10-CM | POA: Diagnosis not present

## 2021-02-11 ENCOUNTER — Other Ambulatory Visit: Payer: Self-pay

## 2021-02-11 ENCOUNTER — Telehealth: Payer: Self-pay

## 2021-02-11 ENCOUNTER — Encounter (HOSPITAL_COMMUNITY): Payer: Self-pay | Admitting: Nurse Practitioner

## 2021-02-11 ENCOUNTER — Ambulatory Visit (HOSPITAL_COMMUNITY)
Admission: RE | Admit: 2021-02-11 | Discharge: 2021-02-11 | Disposition: A | Discharge status: Home / Self Care | Payer: Medicare Other | Source: Ambulatory Visit | Attending: Nurse Practitioner | Admitting: Nurse Practitioner

## 2021-02-11 VITALS — BP 136/80 | HR 64 | Ht 72.0 in | Wt 244.8 lb

## 2021-02-11 DIAGNOSIS — E1159 Type 2 diabetes mellitus with other circulatory complications: Secondary | ICD-10-CM | POA: Diagnosis not present

## 2021-02-11 DIAGNOSIS — Z7901 Long term (current) use of anticoagulants: Secondary | ICD-10-CM | POA: Insufficient documentation

## 2021-02-11 DIAGNOSIS — Z79899 Other long term (current) drug therapy: Secondary | ICD-10-CM | POA: Diagnosis not present

## 2021-02-11 DIAGNOSIS — I700 Atherosclerosis of aorta: Secondary | ICD-10-CM | POA: Insufficient documentation

## 2021-02-11 DIAGNOSIS — I10 Essential (primary) hypertension: Secondary | ICD-10-CM | POA: Diagnosis not present

## 2021-02-11 DIAGNOSIS — E785 Hyperlipidemia, unspecified: Secondary | ICD-10-CM | POA: Diagnosis not present

## 2021-02-11 DIAGNOSIS — I152 Hypertension secondary to endocrine disorders: Secondary | ICD-10-CM

## 2021-02-11 DIAGNOSIS — I491 Atrial premature depolarization: Secondary | ICD-10-CM | POA: Insufficient documentation

## 2021-02-11 DIAGNOSIS — I480 Paroxysmal atrial fibrillation: Secondary | ICD-10-CM | POA: Diagnosis not present

## 2021-02-11 DIAGNOSIS — D6869 Other thrombophilia: Secondary | ICD-10-CM | POA: Diagnosis not present

## 2021-02-11 NOTE — Chronic Care Management (AMB) (Signed)
Chronic Care Management Pharmacy Assistant   Name: Brent Taylor  MRN: [id number] DOB: Aug 16, 1941  Reason for Encounter: Hypertension Disease State Management Call   Recent office visits:  None to report  Recent consult visits:  None to report  Hospital visits:  None in previous 6 months  Medications: Outpatient Encounter Medications as of 02/11/2021  Medication Sig Note  . apixaban (ELIQUIS) 5 MG TABS tablet Take 1 tablet (5 mg total) by mouth 2 (two) times daily.   . Blood Glucose Monitoring Suppl (ONE TOUCH ULTRA 2) w/Device KIT Use daily to check blood sugar.   . diltiazem (CARDIZEM) 30 MG tablet Take 1 tablet every 4 hours AS NEEDED for afib heart rate >100 as long as top blood pressure >100. (Patient not taking: Reported on 12/09/2020)   . FLUoxetine (PROZAC) 40 MG capsule Take 1 capsule (40 mg total) by mouth daily. 01/01/2021: Reports he is taking  . gabapentin (NEURONTIN) 100 MG capsule Take 1 capsule (100 mg total) by mouth at bedtime.   [name] glucose blood test strip Use to test blood sugars daily. Dx: E11.9   . ibuprofen (ADVIL,MOTRIN) 200 MG tablet Take 600 mg by mouth as needed for moderate pain. (Patient not taking: Reported on 12/09/2020)   . Lancets (ONETOUCH ULTRASOFT) lancets Use to test blood sugars daily. Dx: E11.9   . metoprolol tartrate (LOPRESSOR) 25 MG tablet Take 0.5 tablets (12.5 mg total) by mouth 2 (two) times daily.   . pantoprazole (PROTONIX) 20 MG tablet Take 1 tablet (20 mg total) by mouth daily.   . pravastatin (PRAVACHOL) 40 MG tablet Take 1 tablet (40 mg total) by mouth daily.   . quinapril (ACCUPRIL) 20 MG tablet Take 1 tablet (20 mg total) by mouth at bedtime.   . sildenafil (VIAGRA) 100 MG tablet Take 1 tablet (100 mg total) by mouth daily as needed for erectile dysfunction. (Patient not taking: Reported on 12/09/2020)   . traMADol (ULTRAM) 50 MG tablet Take 100 mg by mouth every 12 (twelve) hours as needed for severe pain.    No facility-administered  encounter medications on file as of 02/11/2021.   Reviewed chart prior to disease state call. Spoke with patient regarding BP  Recent Office Vitals: BP Readings from Last 3 Encounters:  12/09/20 136/82  10/13/20 (!) 142/81  10/09/20 (!) 168/94   Pulse Readings from Last 3 Encounters:  12/09/20 70  10/13/20 60  10/09/20 76    Wt Readings from Last 3 Encounters:  12/09/20 246 lb 9.6 oz (111.9 kg)  10/13/20 245 lb 6.4 oz (111.3 kg)  10/09/20 243 lb 9.6 oz (110.5 kg)     Kidney Function Lab Results  Component Value Date/Time   CREATININE 1.08 12/09/2020 08:35 AM   CREATININE 1.20 02/19/2020 01:29 PM   GFR 64.10 12/09/2020 08:35 AM   GFRNONAA 60 (L) 04/25/2016 01:45 PM   GFRAA >60 04/25/2016 01:45 PM    BMP Latest Ref Rng & Units 12/09/2020 02/19/2020 11/05/2019  Glucose 70 - 99 mg/dL 128(H) 105(H) 118(H)  BUN 6 - 23 mg/dL $Remove'20 23 18  'SHPuxqL$ Creatinine 0.40 - 1.50 mg/dL 1.08 1.20 1.19  Sodium 135 - 145 mEq/L 140 137 141  Potassium 3.5 - 5.1 mEq/L 4.2 4.5 4.0  Chloride 96 - 112 mEq/L 105 104 107  CO2 19 - 32 mEq/L $Remove'27 25 25  'FCBPLrY$ Calcium 8.4 - 10.5 mg/dL 8.9 9.3 8.9   Current antihypertensive regimen:  Diltiazem 30 mg once daily as needed- Reported not taking  Metoprolol tartrate 12.5 mg twice daily Quinapril 20 mg once daily at bedtime   How often are you checking your Blood Pressure?  Patient states he checks his b/p regularly but did not have any readings to share.    Current home BP readings: None given  What recent interventions/DTPs have been made by any provider to improve Blood Pressure control since last CPP Visit: None noted  Any recent hospitalizations or ED visits since last visit with CPP? No  What diet changes have been made to improve Blood Pressure Control?  Patient reports no changes in diet  What exercise is being done to improve your Blood Pressure Control? Patient reports regular activity.  Adherence Review: Is the patient currently on ACE/ARB medication?  Yes  Does the patient have >5 day gap between last estimated fill dates? Yes Metoprolol tartrate 12.5 mg last filled 10/19/20 90DS Quinapril 20 mg last filled 01/05/21 90DS  Star Rating Drugs: Pravastatin 40mg  last filled 01/06/21 90 DS  Wil[facility] CPA, CMA

## 2021-02-11 NOTE — Progress Notes (Signed)
Primary Care Physician: [name], MD Referring Physician: Dr. Rayann Heman EP: Dr. Macario Golds Brent Taylor is a 80 y.o. male with a h/o of new dx of paroxysmal afib while on vacation in [place]. He developed dizziness, nausea/clamminess and when it persisted he went to the ER there and was found in afib. [name] He was kept a couple of days. He saw Dr. Rayann Heman on return from there. He had been placed on daily Cardizem along with his previous BB. Since he just had the one episode, he advised to stop it and use 180 Cardizem as needed. He was also placed on eliquis 5 mg bid for a CHA2DS2VASc score of 6. No alcohol or tobacco or excessive caffeine. He does snore, is pending a home study but states he was screened overnight in the hospital for sleep apnea and was told no issues observed.    He has since called here since seeing Dr. Rayann Heman reporting afib. Once he took the 180 mg cardizem, the other time the episode passed in a few hours.   He walked into the afib waiting room, wanting to be seen as he said Dr. Rayann Heman told him to come over anytime he needed attention.He woke this am feeling nauseated, dizzy. Clammy.[name] He took his BP without his am meds and had a systolic BP around 740 systolic. He ran a strip off his watch and it showed sinus brady. He took his am pills  with the extra 180 mg Cardizem and left for here. He assumed since he felt similar with his afib episode in [place], it was related.   EKG shows sinus brady at 52 bpm. His BP is 156/86. He is now feeling better. Review of his Apple strips show some afib, some SR with PAC's and some sinus bradycardia, some that appear to be junctional, but often has artifact, so difficult  to interpret. SR this am.  F/u in afib clinic 10/09/20. He feels well, no further dizzy spells. He saw Dr. Rayann Heman last, decreased BB and he wanted echo scheduled with pt  on this visit. No bleeding issues. Remains on eliquis 5 mg bid for a CHA2DS2VASc score of 6.   F/u in afib  clinic 02/11/21. He has noted any heart irregularity or dizziness early in the am that was problem for a while.   Today, he denies symptoms of palpitations, chest pain, shortness of breath, orthopnea, PND, lower extremity edema, dizziness, presyncope, syncope, or neurologic sequela. The patient is tolerating medications without difficulties and is otherwise without complaint today.   Past Medical History:  Diagnosis Date  . Cancer (HCC)    hx - melanoma  . Chicken pox   . Colon polyps   . Depression    No need for therapy.  Improved  . Diabetes ([place])   . Diverticula, colon   . Hemorrhoids   . Hyperlipidemia   . Hypertension   . Sepsis ([place])   . Tinnitus   . UTI (lower urinary tract infection)    Past Surgical History:  Procedure Laterality Date  . MELANOMA EXCISION     LEFT CHEST  . MELANOMA EXCISION      Current Outpatient Medications  Medication Sig Dispense Refill  . apixaban (ELIQUIS) 5 MG TABS tablet Take 1 tablet (5 mg total) by mouth 2 (two) times daily. 180 tablet 1  . Blood Glucose Monitoring Suppl (ONE TOUCH ULTRA 2) w/Device KIT Use daily to check blood sugar. 1 kit 0  . diltiazem (CARDIZEM) 30  MG tablet Take 1 tablet every 4 hours AS NEEDED for afib heart rate >100 as long as top blood pressure >100. 45 tablet 1  . FLUoxetine (PROZAC) 40 MG capsule Take 1 capsule (40 mg total) by mouth daily. 90 capsule 3  . gabapentin (NEURONTIN) 100 MG capsule Take 1 capsule (100 mg total) by mouth at bedtime. 90 capsule 3  . glucose blood test strip Use to test blood sugars daily. Dx: E11.9 100 strip 3  . ibuprofen (ADVIL,MOTRIN) 200 MG tablet Take 600 mg by mouth as needed for moderate pain.    . Lancets (ONETOUCH ULTRASOFT) lancets Use to test blood sugars daily. Dx: E11.9 100 each 12  . metoprolol tartrate (LOPRESSOR) 25 MG tablet Take 0.5 tablets (12.5 mg total) by mouth 2 (two) times daily. 180 tablet 3  . pantoprazole (PROTONIX) 20 MG tablet Take 1 tablet (20 mg total) by  mouth daily. 90 tablet 3  . pravastatin (PRAVACHOL) 40 MG tablet Take 1 tablet (40 mg total) by mouth daily. 90 tablet 1  . quinapril (ACCUPRIL) 20 MG tablet Take 1 tablet (20 mg total) by mouth at bedtime. 90 tablet 1  . sildenafil (VIAGRA) 100 MG tablet Take 1 tablet (100 mg total) by mouth daily as needed for erectile dysfunction. 10 tablet 0  . traMADol (ULTRAM) 50 MG tablet Take 100 mg by mouth every 12 (twelve) hours as needed for severe pain.     No current facility-administered medications for this encounter.    Allergies  Allergen Reactions  . Lipitor [Atorvastatin Calcium]     Muscle ache    Social History   Socioeconomic History  . Marital status: Married    Spouse name: Not on file  . Number of children: 2  . Years of education: Not on file  . Highest education level: Not on file  Occupational History  . Occupation: Company secretary  . Occupation: retired  Tobacco Use  . Smoking status: Never Smoker  . Smokeless tobacco: Never Used  Substance and Sexual Activity  . Alcohol use: No  . Drug use: No  . Sexual activity: Not on file  Other Topics Concern  . Not on file  Social History Narrative   Lives with wife in [place].   Retired [employer]   Attends [employer] Strain: [place]   . Difficulty of Paying Living Expenses: Not hard at all  Food Insecurity: No Food Insecurity  . Worried About Charity fundraiser in the Last Year: Never true  . Ran Out of Food in the Last Year: Never true  Transportation Needs: No Transportation Needs  . Lack of Transportation (Medical): No  . Lack of Transportation (Non-Medical): No  Physical Activity: Sufficiently Active  . Days of Exercise per Week: 5 days  . Minutes of Exercise per Session: 30 min  Stress: No Stress Concern Present  . Feeling of Stress : Not at all  Social Connections: Moderately Integrated  . Frequency of Communication with Friends  and Family: More than three times a week  . Frequency of Social Gatherings with Friends and Family: More than three times a week  . Attends Religious Services: More than 4 times per year  . Active Member of Clubs or Organizations: No  . Attends Archivist Meetings: Never  . Marital Status: Married  Human resources officer Violence: Not At Risk  . Fear of Current or Ex-Partner: No  .  Emotionally Abused: No  . Physically Abused: No  . Sexually Abused: No    Family History  Problem Relation Age of Onset  . Heart Problems Mother        Skipping    ROS- All systems are reviewed and negative except as per the HPI above  Physical Exam: Vitals:   02/11/21 1333  BP: 136/80  Pulse: 64  Weight: 111 kg  Height: 6' (1.829 m)   Wt Readings from Last 3 Encounters:  02/11/21 111 kg  12/09/20 111.9 kg  10/13/20 111.3 kg    Labs: Lab Results  Component Value Date   NA 140 12/09/2020   K 4.2 12/09/2020   CL 105 12/09/2020   CO2 27 12/09/2020   GLUCOSE 128 (H) 12/09/2020   BUN 20 12/09/2020   CREATININE 1.08 12/09/2020   CALCIUM 8.9 12/09/2020   Lab Results  Component Value Date   INR 1.26 11/10/2015   Lab Results  Component Value Date   CHOL 144 12/09/2020   HDL 31.50 (L) 12/09/2020   LDLCALC 81 12/09/2020   TRIG 157.0 (H) 12/09/2020     GEN- The patient is well appearing, alert and oriented x 3 today.   Head- normocephalic, atraumatic Eyes-  Sclera clear, conjunctiva pink Ears- hearing intact Oropharynx- clear Neck- supple, no JVP Lymph- no cervical lymphadenopathy Lungs- Clear to ausculation bilaterally, normal work of breathing Heart- slow regular rate and rhythm, no murmurs, rubs or gallops, PMI not laterally displaced GI- soft, NT, ND, + BS Extremities- no clubbing, cyanosis, or edema MS- no significant deformity or atrophy Skin- no rash or lesion Psych- euthymic mood, full affect Neuro- strength and sensation are intact  EKG-sinus rhythm at 64 bpm,  qrs int 110 ms, qtc 410 ms  Echo -1. Left ventricular ejection fraction, by estimation, is 50 to 55%. The left ventricle has low normal function. The left ventricle has no regional wall motion abnormalities. There is moderate left ventricular hypertrophy. Left ventricular diastolic parameters are indeterminate. 2. Right ventricular systolic function is normal. The right ventricular size is normal. There is normal pulmonary artery systolic pressure. 3. Left atrial size was mildly dilated. 4. The mitral valve is degenerative. Trivial mitral valve regurgitation. No evidence of mitral stenosis. Moderate mitral annular calcification. 5. The aortic valve is tricuspid. Aortic valve regurgitation is not visualized. Mild to moderate aortic valve sclerosis/calcification is present, without any evidence of aortic stenosis. 6. Aortic dilatation noted. There is mild dilatation of the ascending aorta, measuring 40 mm. 7. The inferior vena cava is normal in size with greater than 50% respiratory variability, suggesting right atrial pressure of 3 mmHg.  Assessment and Plan: 1.  Afib   in SR with pac's Feels well No furhter issues with heart rhythm  Continue  daily metoprolol Has rx for cardizem 30 mg to use  if in afib AND afib has a v response over 100 bpm   2. HTN Stable   3. CHA2DS2VASc score of 6 Continue eliquis 5 mg bid   I will refer to general cardiology for periodic echo surveillance Here as needed for rhythm issues       Butch Penny C. Vanita Cannell, [facility] [address] [place], [place] [address] [phone]

## 2021-02-12 ENCOUNTER — Ambulatory Visit (INDEPENDENT_AMBULATORY_CARE_PROVIDER_SITE_OTHER): Payer: Medicare Other | Admitting: *Deleted

## 2021-02-12 DIAGNOSIS — M545 Low back pain, unspecified: Secondary | ICD-10-CM

## 2021-02-12 DIAGNOSIS — E1159 Type 2 diabetes mellitus with other circulatory complications: Secondary | ICD-10-CM

## 2021-02-12 DIAGNOSIS — I5042 Chronic combined systolic (congestive) and diastolic (congestive) heart failure: Secondary | ICD-10-CM

## 2021-02-12 NOTE — Patient Instructions (Signed)
Visit Information  PATIENT GOALS: Goals Addressed            This Visit's Progress   . COMPLETED: (RNCM) Manage Chronic Pain   On track    Timeframe:  Long-Range Goal Priority:  Medium Start Date:  12/04/20                           Expected End Date:   05/09/21                    Follow Up Date 02/12/21    . Develop a personal pain management plan and establish pain goal . Plan exercise or activity when pain is best controlled . Prioritize tasks for the day . Track times pain is worst and when it is best . Track what makes the pain worse and what makes it better  . Take medications as prescribed   Why is this important?    Day-to-day life can be hard when you have chronic pain.   Pain medicine is just one piece of the treatment puzzle.   You can try these action steps to help you manage your pain.    Notes: 02/12/21-reports pain is controlled with gabapentin    . COMPLETED: (RNCM) Manage My Fatigue (Tiredness-Chronic Pain)   On track    Timeframe:  Long-Range Goal Priority:  High Start Date:   12/04/20                          Expected End Date:   05/09/21                    Follow Up Date 02/12/21    . Eat healthy . Limit daytime naps . Practice relaxation or meditation daily  . Review EMMI Sleep Apnea Video   Why is this important?    Chronic pain and fatigue often go together.   For many, pain disturbs sleep, leaving you tired. Poor sleep together with chronic pain can drain you of energy.   There are healthy and positive ways to manage fatigue.    Notes: 02/12/21-Reports more energy and able to increase activity    . (RNCM) Monitor and Manage My Blood Sugar-Diabetes Type 2       Timeframe:  Long-Range Goal Priority:  Medium Start Date: 02/12/2021                            Expected End Date:  08/10/2021                     Follow Up Date 03/19/21    . Check blood sugar at least 3 times a week . Check blood sugar if I feel it is too high or too low . Enter blood  sugar readings and medication into daily log . Take the blood sugar log to all doctor visits  . Contact primary care provider for sustained elevations   Why is this important?    Checking your blood sugar at home helps to keep it from getting very high or very low.   Writing the results in a diary or log helps the doctor know how to care for you.   Your blood sugar log should have the time, date and the results.   Also, write down the amount of insulin or other medicine that you take.  Other information, like what you ate, exercise done and how you were feeling, will also be helpful.     Notes:     [name] (RNCM) Track and Manage Fluids and Swelling-Heart Failure   On track    Timeframe:  Long-Range Goal Priority:  Medium Start Date:  12/04/20                           Expected End Date:  08/10/21                   Follow Up Date  03/19/21    . Call office if I gain more than 2 pounds in one day or 5 pounds in one week . Weigh myself daily, at same time each day and record in log . Try stick with low salt heart healthy carbohydrate modified diabetic diet . Monitor yourself for rapid irregular heart rate, taking prn medication if indicated and notifying provider . Take weight log with you to medical appointments . Discuss CPAP and mask with provider   Why is this important?    It is important to check your weight daily and watch how much salt and liquids you have.   It will help you to manage your heart failure.    Notes:        Patient verbalizes understanding of instructions provided today and agrees to view in [place].   The care management team will reach out to the patient again over the next 45 business days.   Hubert Azure RN, MSN RN Care Management Coordinator  [facility] [phone] [email or web address]   Heart Failure Action Plan A heart failure action plan helps you understand what to do when you have symptoms of heart  failure. Your action plan is a color-coded plan that lists the symptoms to watch for and indicates what actions to take.  If you have symptoms in the red zone, you need medical care right away.  If you have symptoms in the yellow zone, you are having problems.  If you have symptoms in the green zone, you are doing well. Follow the plan that was created by you and your health care provider. Review your plan each time you visit your health care provider. Red zone These signs and symptoms mean you should get medical help right away:  You have trouble breathing when resting.  You have a dry cough that is getting worse.  You have swelling or pain in your legs or abdomen that is getting worse.  You suddenly gain more than 2-3 lb (0.9-1.4 kg) in 24 hours, or more than 5 lb (2.3 kg) in a week. This amount may be more or less depending on your condition.  You have trouble staying awake or you feel confused.  You have chest pain.  You do not have an appetite.  You pass out.  You have worsening sadness or depression. If you have any of these symptoms, call your local emergency services (911 in the [place] right away. Do not drive yourself to the hospital.   Yellow zone These signs and symptoms mean your condition may be getting worse and you should make some changes:  You have trouble breathing when you are active, or you need to sleep with your head raised on extra pillows to help you breathe.  You have swelling in your legs or abdomen.  You gain 2-3 lb (0.9-1.4 kg) in 24 hours, or 5 lb (2.3  kg) in a week. This amount may be more or less depending on your condition.  You get tired easily.  You have trouble sleeping.  You have a dry cough. If you have any of these symptoms:  Contact your health care provider within the next day.  Your health care provider may adjust your medicines.   Green zone These signs mean you are doing well and can continue what you are doing:  You do not  have shortness of breath.  You have very little swelling or no new swelling.  Your weight is stable (no gain or loss).  You have a normal activity level.  You do not have chest pain or any other new symptoms.   Follow these instructions at home:  Take over-the-counter and prescription medicines only as told by your health care provider.  Weigh yourself daily. Your target weight is __________ lb (__________ kg). ? Call your health care provider if you gain more than __________ lb (__________ kg) in 24 hours, or more than __________ lb (__________ kg) in a week. ? Health care provider name: _____________________________________________________ ? Health care provider phone number: _____________________________________________________  Eat a heart-healthy diet. Work with a diet and nutrition specialist (dietitian) to create an eating plan that is best for you.  Keep all follow-up visits. This is important. Where to find more information  American Heart Association: www.heart.org Summary  A heart failure action plan helps you understand what to do when you have symptoms of heart failure.  Follow the action plan that was created by you and your health care provider.  Get help right away if you have any symptoms in the red zone. This information is not intended to replace advice given to you by your health care provider. Make sure you discuss any questions you have with your health care provider. Document Revised: 05/12/2020 Document Reviewed: 05/12/2020 Elsevier Patient Education  2021 [employer].

## 2021-02-12 NOTE — Chronic Care Management (AMB) (Signed)
Chronic Care Management   CCM RN Visit Note  02/12/2021 Name: Brent Taylor MRN: [id number] DOB: 08-19-1941  Subjective: Brent Taylor is a 80 y.o. year old male who is a primary care patient of Brent Barrack, MD. The care management team was consulted for assistance with disease management and care coordination needs.    Engaged with patient by telephone for follow up visit in response to provider referral for case management and/or care coordination services.   Consent to Services:  The patient was given information about Chronic Care Management services, agreed to services, and gave verbal consent prior to initiation of services.  Please see initial visit note for detailed documentation.   Patient agreed to services and verbal consent obtained.   Assessment: Review of patient past medical history, allergies, medications, health status, including review of consultants reports, laboratory and other test data, was performed as part of comprehensive evaluation and provision of chronic care management services.   SDOH (Social Determinants of Health) assessments and interventions performed:    CCM Care Plan  Allergies  Allergen Reactions  . Lipitor [Atorvastatin Calcium]     Muscle ache    Outpatient Encounter Medications as of 02/12/2021  Medication Sig  . apixaban (ELIQUIS) 5 MG TABS tablet Take 1 tablet (5 mg total) by mouth 2 (two) times daily.  [name] FLUoxetine (PROZAC) 40 MG capsule Take 1 capsule (40 mg total) by mouth daily.  [name] gabapentin (NEURONTIN) 100 MG capsule Take 1 capsule (100 mg total) by mouth at bedtime.  . metoprolol tartrate (LOPRESSOR) 25 MG tablet Take 0.5 tablets (12.5 mg total) by mouth 2 (two) times daily.  . Blood Glucose Monitoring Suppl (ONE TOUCH ULTRA 2) w/Device KIT Use daily to check blood sugar.  . diltiazem (CARDIZEM) 30 MG tablet Take 1 tablet every 4 hours AS NEEDED for afib heart rate >100 as long as top blood pressure >100.  . glucose blood test strip Use  to test blood sugars daily. Dx: E11.9  . ibuprofen (ADVIL,MOTRIN) 200 MG tablet Take 600 mg by mouth as needed for moderate pain.  . Lancets (ONETOUCH ULTRASOFT) lancets Use to test blood sugars daily. Dx: E11.9  . pantoprazole (PROTONIX) 20 MG tablet Take 1 tablet (20 mg total) by mouth daily.  . pravastatin (PRAVACHOL) 40 MG tablet Take 1 tablet (40 mg total) by mouth daily.  . quinapril (ACCUPRIL) 20 MG tablet Take 1 tablet (20 mg total) by mouth at bedtime.  . sildenafil (VIAGRA) 100 MG tablet Take 1 tablet (100 mg total) by mouth daily as needed for erectile dysfunction.  . traMADol (ULTRAM) 50 MG tablet Take 100 mg by mouth every 12 (twelve) hours as needed for severe pain.   No facility-administered encounter medications on file as of 02/12/2021.    Patient Active Problem List   Diagnosis Date Noted  . OSA (obstructive sleep apnea) 12/09/2020  . Sensorineural hearing loss (SNHL) of left ear with restricted hearing of right ear 08/19/2020  . Subjective tinnitus, bilateral 08/19/2020  . Atrial fibrillation ([place]) 06/10/2020  . Degeneration of lumbar intervertebral disc 03/31/2020  . Low back pain 03/11/2020  . Spinal stenosis of lumbar region 08/29/2019  . Type 2 diabetes mellitus with vascular disease ([place]) 10/20/2016  . Hypertension associated with diabetes ([place]) 11/20/2015  . Chronic combined systolic and diastolic heart failure ([place])   . Coronary artery disease 12/10/2011  . Dyslipidemia associated with type 2 diabetes mellitus ([place]) 12/10/2011  . Major depression in remission ([place]) 12/10/2011  .  GERD (gastroesophageal reflux disease) 12/10/2011  . Hx of colonic polyps 12/10/2011  . Melanoma ([place]) 12/10/2011    Conditions to be addressed/monitored:CHF and DMII  Care Plan : Heart Failure (Adult)  Updates made by Leona Singleton, RN since 02/12/2021 12:00 AM  Problem: Heart Failure Management   Priority: Medium  Long-Range Goal: Symptom Exacerbation Prevented or Minimized    Start Date: 12/04/2020  Expected End Date: 08/10/2021  This Visit's Progress: On track  Recent Progress: On track  Priority: Medium  Current Barriers:  [name] Knowledge deficits related to basic heart failure pathophysiology and self care management as evidenced by patient not weighing himself daily.  Patient stating he is now weighing himself daily.  Weight this morning was 244 pounds.  Does report no recent episode of rapid heart rates that he has noticed.  Denies any lower extremity edema or shortness of breath.  Continues to monitor blood pressures (130/80).  States he has tried to wear CPAP at night, but feels it does not help him rest.   Nurse Case Manager Clinical Goal(s):   Over the next 90 days, patient will weigh self daily and record  Over the next 120 days, patient will verbalize understanding of Heart Failure Action Plan and when to call doctor Interventions:  . Collaboration with Brent Barrack, MD regarding development and update of comprehensive plan of care as evidenced by provider attestation and co-signature . Inter-disciplinary care team collaboration (see longitudinal plan of care) . Basic overview and discussion of pathophysiology of Heart Failure . Provided verbal education on low sodium heart healthy carbohydrate modified diabetic diet . Reviewed signs and symptoms of heart failure and discussed Heart Failure Action Plan  . Rediscussed importance of daily weight monitoring and when to call provider based on weight . Encouraged patient to view EMMI Heart Failure  and daily weight monitoring educational video  . Encouraged to continue to monitor heart rate for increase rate and being out of rhythm  . Reviewed medications and encouraged medication compliance . Discussed CPAP and varying mask and encouraged patient to discuss with provider . Sending copy of heart failure action plan . Encouraged continued blood pressure monitoring and notifying provider for sustained  elevations . Encouraged to keep and attend scheduled medical appointments . No recommendations/changes Patient Goals/Self-Care Activities Over the next 90 days, patient will:  . Call office if I gain more than 2 pounds in one day or 5 pounds in one week . Weigh myself daily, at same time each day and record in log . Try stick with low salt heart healthy carbohydrate modified diabetic diet . Monitor yourself for rapid irregular heart rate, taking prn medication if indicated and notifying provider . Take weight log with you to medical appointments . Discuss CPAP and mask with provider Follow Up Plan: The care management team will reach out to the patient again over the next 45 business days.     Care Plan : Chronic Pain (Adult)  Updates made by Leona Singleton, RN since 02/12/2021 12:00 AM  Completed 02/12/2021  Problem: Increase in episodes of chronic pain and feelings of being tired more Resolved 02/12/2021  Priority: High  Long-Range Goal: Pain Management Plan Developed and Energy Increased Completed 02/12/2021  Start Date: 12/04/2020  Expected End Date: 05/09/2021  This Visit's Progress: On track  Recent Progress: On track  Priority: High  Current Barriers:   Ineffective Self Health Maintenance of chronic pain and energy level; Patient reports pain is managed with current  dose of Gabapentin.  Denies pain at this time.  Also reports increase in energy level, able to increase activity and exercise as tolerated.  Goal completed  Knowledge Deficits related to short term plan for care coordination needs and long term plans for chronic disease management needs Clinical Goal(s):  [name] Collaboration with Brent Barrack, MD regarding development and update of comprehensive plan of care as evidenced by provider attestation and co-signature . Inter-disciplinary care team collaboration (see longitudinal plan of care)  Over the next 120 days, patient will work with care management team to address care  coordination and chronic disease management needs related to increased pain and feelings of tiredness   Interventions:   Evaluation of current treatment plan related to chronic pain self-management and patient's adherence to plan as established by provider.  Discussed plans with patient for ongoing care management follow up and provided patient with direct contact information for care management team  Reviewed medications and encouraged medication compliance; discussed limited to no use of Ibuprofen while on Eliquis  Encouraged to continue to work with  CCM Pharmacist for questions and concerns related to medication reconciliation and medication management  Encouraged continued diversional activities like prayer and meditation  Encouraged patient to increase activity as tolerated with reduced pain  Discussed and encouraged patient to develop pain management plan and set pain goal  Encouraged to monitor pain triggers and what helps treat/manage pain  Reviewed sleep pattern and encouraged at least 6-8 hours nightly sleep  Discussed history of sleep apnea and role of CPAP, encouraged patient to discuss with providers  Encouraged to review education on Fatigue previously sent Patient Goals/Self Care Activities:  Over the next 30 days, patient will:  . Develop a personal pain management plan and establish pain goal . Plan exercise or activity when pain is best controlled . Prioritize tasks for the day . Track times pain is worst and when it is best . Track what makes the pain worse and what makes it better  . Take medications as prescribed . Eat healthy . Limit daytime naps . Practice relaxation or meditation daily  . Review EMMI Sleep Apnea Video Follow Up Plan:  This goal is completed/resolved 02/12/21   Care Plan : Diabetes Type 2 (Adult)  Updates made by Leona Singleton, RN since 02/12/2021 12:00 AM  Problem: Glycemic Management (Diabetes, Type 2)   Priority: Medium  Goal:  Patient will report maintaining Hgb A1C of below 7 in the next 90 days   Start Date: 02/12/2021  Expected End Date: 08/10/2021  Priority: Medium  Objective:  Lab Results  Component Value Date   HGBA1C 6.5 12/09/2020  Current Barriers:  [name] Knowledge Deficits related to basic Diabetes pathophysiology and self care/management; Reports monitoring blood sugars about every other day, with fasting ranges of 110-125.  Denies any episodes of hypo or hyperglycemia.  Latest Hgb A1C slightly increased to 6.5. Case Manager Clinical Goal(s):  . patient will demonstrate improved adherence to prescribed treatment plan for diabetes self care/management as evidenced by: every other day monitoring and recording of CBG; adherence to ADA/ carb modified diet; adherence to prescribed medication regimen; contacting provider for new or worsened symptoms or questions Interventions:  . Collaboration with Brent Barrack, MD regarding development and update of comprehensive plan of care as evidenced by provider attestation and co-signature . Inter-disciplinary care team collaboration (see longitudinal plan of care) . Provided education to patient about basic DM disease process . Reviewed medications with patient and  discussed importance of medication adherence . Discussed plans with patient for ongoing care management follow up and provided patient with direct contact information for care management team . Provided patient with verbal education related to hypo and hyperglycemia and importance of correct treatment; and self-awareness of signs/symptoms of hypo or hyperglycemia encouraged . Advised patient, providing education and rationale, to check cbg every other day and record, calling PCP for findings outside established parameters.   . Barriers to adherence to treatment plan identified . Blood glucose monitoring encouraged; blood glucose readings reviewed; - use of blood glucose monitoring log promoted . Mutual A1C goal  set or reviewed; Discussed ways to help maintain current A1C . Congratulated patient on current A1C (although slightly increased, still within goal) . Encouraged to attend all scheduled provider appointments . Discussed and encouraged to adhere to prescribed ADA/carb modified . Encouraged to increase activity as tolerated . No immediate recommendations/changes Patient Goals/Self-Care Activities: . Check blood sugar at least 3 times a week . Check blood sugar if I feel it is too high or too low . Enter blood sugar readings and medication into daily log . Take the blood sugar log to all doctor visits  . Contact primary care provider for sustained elevations Follow Up Plan: The care management team will reach out to the patient again over the next 45 business days.      Plan:The care management team will reach out to the patient again over the next 45 business days.  Hubert Azure RN, MSN RN Care Management Coordinator  [place] [phone] [email or web address]

## 2021-02-13 ENCOUNTER — Encounter: Payer: Self-pay | Admitting: Internal Medicine

## 2021-02-13 ENCOUNTER — Other Ambulatory Visit: Payer: Self-pay

## 2021-02-13 ENCOUNTER — Ambulatory Visit: Payer: Medicare Other | Admitting: Internal Medicine

## 2021-02-13 VITALS — BP 130/70 | HR 78 | Ht 72.0 in | Wt 242.0 lb

## 2021-02-13 DIAGNOSIS — I2510 Atherosclerotic heart disease of native coronary artery without angina pectoris: Secondary | ICD-10-CM

## 2021-02-13 DIAGNOSIS — E782 Mixed hyperlipidemia: Secondary | ICD-10-CM | POA: Diagnosis not present

## 2021-02-13 DIAGNOSIS — I712 Thoracic aortic aneurysm, without rupture, unspecified: Secondary | ICD-10-CM

## 2021-02-13 DIAGNOSIS — G4733 Obstructive sleep apnea (adult) (pediatric): Secondary | ICD-10-CM

## 2021-02-13 DIAGNOSIS — I10 Essential (primary) hypertension: Secondary | ICD-10-CM | POA: Diagnosis not present

## 2021-02-13 DIAGNOSIS — E1169 Type 2 diabetes mellitus with other specified complication: Secondary | ICD-10-CM | POA: Diagnosis not present

## 2021-02-13 DIAGNOSIS — I480 Paroxysmal atrial fibrillation: Secondary | ICD-10-CM | POA: Diagnosis not present

## 2021-02-13 DIAGNOSIS — I77819 Aortic ectasia, unspecified site: Secondary | ICD-10-CM | POA: Insufficient documentation

## 2021-02-13 DIAGNOSIS — E785 Hyperlipidemia, unspecified: Secondary | ICD-10-CM | POA: Diagnosis not present

## 2021-02-13 NOTE — Patient Instructions (Signed)
Medication Instructions:  Your physician recommends that you continue on your current medications as directed. Please refer to the Current Medication list given to you today.  *If you need a refill on your cardiac medications before your next appointment, please call your pharmacy*   Lab Work: NONE If you have labs (blood work) drawn today and your tests are completely normal, you will receive your results only by: [name] MyChart Message (if you have MyChart) OR . A paper copy in the mail If you have any lab test that is abnormal or we need to change your treatment, we will call you to review the results.   Testing/Procedures: Your physician has requested that you have an echocardiogram due in 8-9 months. Echocardiography is a painless test that uses sound waves to create images of your heart. It provides your doctor with information about the size and shape of your heart and how well your heart's chambers and valves are working. This procedure takes approximately one hour. There are no restrictions for this procedure.     Follow-Up: At [facility], you and your health needs are our priority.  As part of our continuing mission to provide you with exceptional heart care, we have created designated Provider Care Teams.  These Care Teams include your primary Cardiologist (physician) and Advanced Practice Providers (APPs -  Physician Assistants and Nurse Practitioners) who all work together to provide you with the care you need, when you need it.  We recommend signing up for the patient portal called "MyChart".  Sign up information is provided on this After Visit Summary.  MyChart is used to connect with patients for Virtual Visits (Telemedicine).  Patients are able to view lab/test results, encounter notes, upcoming appointments, etc.  Non-urgent messages can be sent to your provider as well.   To learn more about what you can do with MyChart, go to [email or web address].    Your next  appointment:   9 month(s)  The format for your next appointment:   In Person  Provider:   You may see Rudean Haskell, MD or one of the following Advanced Practice Providers on your designated Care Team:    Melina Copa, PA-C  Ermalinda Barrios, PA-C

## 2021-02-13 NOTE — Progress Notes (Signed)
Cardiology Office Note:    Date:  02/13/2021   ID:  Brent Taylor, DOB 29-Oct-1940, MRN [id number]  PCP:  [name], MD   [name] Providers Cardiologist:  Werner Lean, MD     Referring MD: [name], MD   CC: Thoracic Aortic Aneurysm monitoring Consulted for the evaluation of Brent Taylor at the behest of [name], MD  History of Present Illness:    Brent Taylor is a 80 y.o. male with a hx of PAF, CAD win [place] in 2007 with moderate disease NOS, HLD, and incidental mild thoracic aortic aneurysm monitoring (4.0 cm) who presents for evaluation.  Patient notes that he is feeling good.  Feel lethargy when he is in AF but hasn't had an AF spell in weeks and feels great.  Has had no chest pain, chest pressure, chest tightness, chest stinging.  Heart catheterization done in 2007 in [place] because of  stress test (asx) that was abnormal.  Patient exertion notable for yardwork and feels no symptoms.  No shortness of breath, DOE.  No PND or orthopnea.  No bendopnea, weight gain, leg swelling , or abdominal swelling.  No syncope or near syncope . Notes  occasional skip in his heart beat without symptoms all of his life; different to his AF.   No Prior stroke, DM, or CHF.   Past Medical History:  Diagnosis Date  . Cancer (HCC)    hx - melanoma  . Chicken pox   . Colon polyps   . Depression    No need for therapy.  Improved  . Diabetes ([place])   . Diverticula, colon   . Hemorrhoids   . Hyperlipidemia   . Hypertension   . Sepsis ([place])   . Tinnitus   . UTI (lower urinary tract infection)     Past Surgical History:  Procedure Laterality Date  . MELANOMA EXCISION     LEFT CHEST  . MELANOMA EXCISION      Current Medications: Current Meds  Medication Sig  . apixaban (ELIQUIS) 5 MG TABS tablet Take 1 tablet (5 mg total) by mouth 2 (two) times daily.  . Blood Glucose Monitoring Suppl (ONE TOUCH ULTRA 2) w/Device KIT Use daily to check blood sugar.  .  diltiazem (CARDIZEM) 30 MG tablet Take 1 tablet every 4 hours AS NEEDED for afib heart rate >100 as long as top blood pressure >100.  [name] FLUoxetine (PROZAC) 40 MG capsule Take 1 capsule (40 mg total) by mouth daily.  [name] gabapentin (NEURONTIN) 100 MG capsule Take 1 capsule (100 mg total) by mouth at bedtime.  [name] glucose blood test strip Use to test blood sugars daily. Dx: E11.9  . ibuprofen (ADVIL,MOTRIN) 200 MG tablet Take 600 mg by mouth as needed for moderate pain.  . Lancets (ONETOUCH ULTRASOFT) lancets Use to test blood sugars daily. Dx: E11.9  . metoprolol tartrate (LOPRESSOR) 25 MG tablet Take 0.5 tablets (12.5 mg total) by mouth 2 (two) times daily.  . pantoprazole (PROTONIX) 20 MG tablet Take 1 tablet (20 mg total) by mouth daily.  . pravastatin (PRAVACHOL) 40 MG tablet Take 1 tablet (40 mg total) by mouth daily.  . quinapril (ACCUPRIL) 20 MG tablet Take 1 tablet (20 mg total) by mouth at bedtime.  . sildenafil (VIAGRA) 100 MG tablet Take 1 tablet (100 mg total) by mouth daily as needed for erectile dysfunction.  . traMADol (ULTRAM) 50 MG tablet Take 100 mg by mouth every 12 (twelve) hours as needed  for severe pain.     Allergies:   Lipitor [atorvastatin calcium]   Social History   Socioeconomic History  . Marital status: Married    Spouse name: Not on file  . Number of children: 2  . Years of education: Not on file  . Highest education level: Not on file  Occupational History  . Occupation: Company secretary  . Occupation: retired  Tobacco Use  . Smoking status: Never Smoker  . Smokeless tobacco: Never Used  Substance and Sexual Activity  . Alcohol use: No  . Drug use: No  . Sexual activity: Not on file  Other Topics Concern  . Not on file  Social History Narrative   Lives with wife in [place].   Retired [employer]   Attends [place] Strain: Erma   . Difficulty of Paying Living Expenses: Not  hard at all  Food Insecurity: No Food Insecurity  . Worried About Charity fundraiser in the Last Year: Never true  . Ran Out of Food in the Last Year: Never true  Transportation Needs: No Transportation Needs  . Lack of Transportation (Medical): No  . Lack of Transportation (Non-Medical): No  Physical Activity: Sufficiently Active  . Days of Exercise per Week: 5 days  . Minutes of Exercise per Session: 30 min  Stress: No Stress Concern Present  . Feeling of Stress : Not at all  Social Connections: Moderately Integrated  . Frequency of Communication with Friends and Family: More than three times a week  . Frequency of Social Gatherings with Friends and Family: More than three times a week  . Attends Religious Services: More than 4 times per year  . Active Member of Clubs or Organizations: No  . Attends Archivist Meetings: Never  . Marital Status: Married     Family History: The patient's family history includes Heart Problems in his mother. History of coronary artery disease notable for father with CABG. History of heart failure notable for father. History of arrhythmia notable for mother with palpitations. Denies family history of sudden cardiac death including drowning, car accidents, or unexplained deaths in the family. No history of bicuspid aortic valve or aortic aneurysm or dissection.    ROS:   Please see the history of present illness.     All other systems reviewed and are negative.  EKGs/Labs/Other Studies Reviewed:    The following studies were reviewed today:  EKG: 02/11/21: SR Rate 64 PACs LVH  Transthoracic Echocardiogram: Date: 10/15/20 Results: 1. Left ventricular ejection fraction, by estimation, is 50 to 55%. The  left ventricle has low normal function. The left ventricle has no regional  wall motion abnormalities. There is moderate left ventricular hypertrophy.  Left ventricular diastolic  parameters are indeterminate.  2. Right ventricular  systolic function is normal. The right ventricular  size is normal. There is normal pulmonary artery systolic pressure.  3. Left atrial size was mildly dilated.  4. The mitral valve is degenerative. Trivial mitral valve regurgitation.  No evidence of mitral stenosis. Moderate mitral annular calcification.  5. The aortic valve is tricuspid. Aortic valve regurgitation is not  visualized. Mild to moderate aortic valve sclerosis/calcification is  present, without any evidence of aortic stenosis.  6. Aortic dilatation noted. There is mild dilatation of the ascending  aorta, measuring 40 mm.  7. The inferior vena cava is normal in size with greater than 50%  respiratory variability,  suggesting right atrial pressure of 3 mmHg.  NM Stress Testing : Date: 07/25/2012 Results: No issues- cannot pull exam  Left/Right Heart Catheterizations: Date: 09/21/2006 Results: [place] and unable to pull up films; patient suggests moderate disease by discription   Recent Labs: 12/09/2020: ALT 22; BUN 20; Creatinine, Ser 1.08; Hemoglobin 15.9; Platelets 176.0; Potassium 4.2; Sodium 140; TSH 2.07  Recent Lipid Panel    Component Value Date/Time   CHOL 144 12/09/2020 0835   TRIG 157.0 (H) 12/09/2020 0835   HDL 31.50 (L) 12/09/2020 0835   CHOLHDL 5 12/09/2020 0835   VLDL 31.4 12/09/2020 0835   LDLCALC 81 12/09/2020 0835   LDLDIRECT 142.7 12/10/2011 1436     Risk Assessment/Calculations:    CHA2DS2-VASc Score = 4  This indicates a 4.8% annual risk of stroke. The patient's score is based upon: CHF History: No HTN History: Yes Diabetes History: No Stroke History: No Vascular Disease History: Yes Age Score: 2 Gender Score: 0       Physical Exam:    VS:  BP 130/70   Pulse 78   Ht 6' (1.829 m)   Wt 242 lb (109.8 kg)   SpO2 96%   BMI 32.82 kg/m     Wt Readings from Last 3 Encounters:  02/13/21 242 lb (109.8 kg)  02/11/21 244 lb 12.8 oz (111 kg)  12/09/20 246 lb 9.6 oz (111.9 kg)      GEN:  Well nourished, well developed in no acute distress HEENT: Normal NECK: No JVD; No carotid bruits LYMPHATICS: No lymphadenopathy CARDIAC: RRR, no murmurs, rubs, gallops RESPIRATORY:  Clear to auscultation without rales, wheezing or rhonchi  ABDOMEN: Soft, non-tender, non-distended MUSCULOSKELETAL:  No edema; No deformity  SKIN: Warm and dry NEUROLOGIC:  Alert and oriented x 3 PSYCHIATRIC:  Normal affect   ASSESSMENT:    1. Thoracic aortic aneurysm without rupture (HCC)   2. Paroxysmal atrial fibrillation ([place])   3. OSA (obstructive sleep apnea)   4. Essential hypertension   5. Mixed hyperlipidemia   6. Coronary artery disease involving native coronary artery of native heart without angina pectoris    PLAN:    In order of problems listed above:  PAF- CHADSVASC 6 per EP (AGE x2, HTN, CAD; ? Of CHF with 2017 mrEF) on Eliquis OSA- with difficulty with mask  HTN HLD CAD  Thoracic Aortic Aneurysm BSA 2.3; ULN for age and BAS (40 mm) - Continue Eliquis and metoprolol and PRN diltiazem for AF; low burden and symptomatic when it occurs; discussed CPAP use (presently minimal) - continue quinapil and for BP - continue statin for CAD (no plavix on eliquis) LDL above 70, will discuss repeating testing with patient and more aggressive targets at next visit - will get echocardiogram in Winter; discussed low dissection risk and ULN Aorta  Winter follow up post Echo unless new symptoms or abnormal test results warranting change in plan  Would be reasonable for  APP Follow up        Medication Adjustments/Labs and Tests Ordered: Current medicines are reviewed at length with the patient today.  Concerns regarding medicines are outlined above.  Orders Placed This Encounter  Procedures  . ECHOCARDIOGRAM COMPLETE   No orders of the defined types were placed in this encounter.   Patient Instructions  Medication Instructions:  Your physician recommends that you continue on  your current medications as directed. Please refer to the Current Medication list given to you today.  *If you need a refill on your cardiac  medications before your next appointment, please call your pharmacy*   Lab Work: NONE If you have labs (blood work) drawn today and your tests are completely normal, you will receive your results only by: [name] MyChart Message (if you have MyChart) OR . A paper copy in the mail If you have any lab test that is abnormal or we need to change your treatment, we will call you to review the results.   Testing/Procedures: Your physician has requested that you have an echocardiogram due in 8-9 months. Echocardiography is a painless test that uses sound waves to create images of your heart. It provides your doctor with information about the size and shape of your heart and how well your heart's chambers and valves are working. This procedure takes approximately one hour. There are no restrictions for this procedure.     Follow-Up: At [facility], you and your health needs are our priority.  As part of our continuing mission to provide you with exceptional heart care, we have created designated Provider Care Teams.  These Care Teams include your primary Cardiologist (physician) and Advanced Practice Providers (APPs -  Physician Assistants and Nurse Practitioners) who all work together to provide you with the care you need, when you need it.  We recommend signing up for the patient portal called "MyChart".  Sign up information is provided on this After Visit Summary.  MyChart is used to connect with patients for Virtual Visits (Telemedicine).  Patients are able to view lab/test results, encounter notes, upcoming appointments, etc.  Non-urgent messages can be sent to your provider as well.   To learn more about what you can do with MyChart, go to [email or web address].    Your next appointment:   9 month(s)  The format for your next appointment:   In  Person  Provider:   You may see Rudean Haskell, MD or one of the following Advanced Practice Providers on your designated Care Team:    Melina Copa, PA-C  Ermalinda Barrios, PA-C          Signed, Werner Lean, MD  02/13/2021 8:33 AM    [place]

## 2021-02-15 DIAGNOSIS — G4733 Obstructive sleep apnea (adult) (pediatric): Secondary | ICD-10-CM | POA: Diagnosis not present

## 2021-02-16 DIAGNOSIS — M5440 Lumbago with sciatica, unspecified side: Secondary | ICD-10-CM | POA: Diagnosis not present

## 2021-02-16 DIAGNOSIS — M9903 Segmental and somatic dysfunction of lumbar region: Secondary | ICD-10-CM | POA: Diagnosis not present

## 2021-02-16 DIAGNOSIS — M5430 Sciatica, unspecified side: Secondary | ICD-10-CM | POA: Diagnosis not present

## 2021-02-16 DIAGNOSIS — M9901 Segmental and somatic dysfunction of cervical region: Secondary | ICD-10-CM | POA: Diagnosis not present

## 2021-02-18 ENCOUNTER — Telehealth: Payer: Self-pay | Admitting: *Deleted

## 2021-02-18 ENCOUNTER — Telehealth: Payer: Medicare Other | Admitting: Cardiology

## 2021-02-18 ENCOUNTER — Encounter: Payer: Self-pay | Admitting: Cardiology

## 2021-02-18 VITALS — BP 138/73 | HR 68 | Temp 97.0°F | Ht 72.0 in | Wt 243.0 lb

## 2021-02-18 DIAGNOSIS — I10 Essential (primary) hypertension: Secondary | ICD-10-CM

## 2021-02-18 DIAGNOSIS — E669 Obesity, unspecified: Secondary | ICD-10-CM | POA: Diagnosis not present

## 2021-02-18 DIAGNOSIS — G4733 Obstructive sleep apnea (adult) (pediatric): Secondary | ICD-10-CM

## 2021-02-18 NOTE — Progress Notes (Signed)
Virtual Visit via Video Note   This visit type was conducted due to national recommendations for restrictions regarding the COVID-19 Pandemic (e.g. social distancing) in an effort to limit this patient's exposure and mitigate transmission in our community.  Due to his co-morbid illnesses, this patient is at least at moderate risk for complications without adequate follow up.  This format is felt to be most appropriate for this patient at this time.  All issues noted in this document were discussed and addressed.  A limited physical exam was performed with this format.  Please refer to the patient's chart for his consent to telehealth for [facility].       Date:  02/18/2021   ID:  Brent Taylor, DOB 1940-11-02, MRN [id number] The patient was identified using 2 identifiers.  Patient Location: Home Provider Location: Home Office   PCP:  [name], MD   [facility] Providers Cardiologist:  Werner Lean, MD     Evaluation Performed:  Follow-Up Visit  Chief Complaint:  OSA  History of Present Illness:    Brent Taylor is a 80 y.o. male with a hx of DM, HTN, PAF and HLD who was referred for home sleep study due to atrial fibrillation.  He was found to have mild OSA with an AHI of 14.1/hr and O2 sats as low as 83%.  He was started on auto CPAP and is now here for followup.    He tells me that the PAP is very aggravating and cannot sleep well at night.  He only uses it for a few hours and then takes it off and sleeps the rest of the night.  He uses a FFM and is a mouth breather at times.  He has not tried a nasal or nasal pillow mask.   He tolerates the pressure is adequate.  He says that he does feel rested in the am when he gets up.  He has some problems with mouth dryness in the am.     The patient does not have symptoms concerning for COVID-19 infection (fever, chills, cough, or new shortness of breath).    Past Medical History:  Diagnosis Date  . Cancer (HCC)     hx - melanoma  . Chicken pox   . Colon polyps   . Depression    No need for therapy.  Improved  . Diabetes ([place])   . Diverticula, colon   . Hemorrhoids   . Hyperlipidemia   . Hypertension   . Sepsis ([place])   . Tinnitus   . UTI (lower urinary tract infection)    Past Surgical History:  Procedure Laterality Date  . MELANOMA EXCISION     LEFT CHEST  . MELANOMA EXCISION       Current Meds  Medication Sig  . apixaban (ELIQUIS) 5 MG TABS tablet Take 1 tablet (5 mg total) by mouth 2 (two) times daily.  . Blood Glucose Monitoring Suppl (ONE TOUCH ULTRA 2) w/Device KIT Use daily to check blood sugar.  . diltiazem (CARDIZEM) 30 MG tablet Take 1 tablet every 4 hours AS NEEDED for afib heart rate >100 as long as top blood pressure >100.  [name] FLUoxetine (PROZAC) 40 MG capsule Take 1 capsule (40 mg total) by mouth daily.  [name] gabapentin (NEURONTIN) 100 MG capsule Take 1 capsule (100 mg total) by mouth at bedtime.  [name] glucose blood test strip Use to test blood sugars daily. Dx: E11.9  . ibuprofen (ADVIL,MOTRIN) 200 MG tablet Take 600  mg by mouth as needed for moderate pain.  . Lancets (ONETOUCH ULTRASOFT) lancets Use to test blood sugars daily. Dx: E11.9  . metoprolol tartrate (LOPRESSOR) 25 MG tablet Take 0.5 tablets (12.5 mg total) by mouth 2 (two) times daily.  . pantoprazole (PROTONIX) 20 MG tablet Take 1 tablet (20 mg total) by mouth daily.  . pravastatin (PRAVACHOL) 40 MG tablet Take 1 tablet (40 mg total) by mouth daily.  . quinapril (ACCUPRIL) 20 MG tablet Take 1 tablet (20 mg total) by mouth at bedtime.  . sildenafil (VIAGRA) 100 MG tablet Take 1 tablet (100 mg total) by mouth daily as needed for erectile dysfunction.  . traMADol (ULTRAM) 50 MG tablet Take 100 mg by mouth every 12 (twelve) hours as needed for severe pain.     Allergies:   Lipitor [atorvastatin calcium]   Social History   Tobacco Use  . Smoking status: Never Smoker  . Smokeless tobacco: Never Used  Substance Use  Topics  . Alcohol use: No  . Drug use: No     Family Hx: The patient's family history includes Heart Problems in his mother.   ROS:   Please see the history of present illness.     All other systems reviewed and are negative.   Prior CV studies:   The following studies were reviewed today:  Home sleep study and PAP compliance download  Labs/Other Tests and Data Reviewed:    EKG:  No ECG reviewed.  Recent Labs: 12/09/2020: ALT 22; BUN 20; Creatinine, Ser 1.08; Hemoglobin 15.9; Platelets 176.0; Potassium 4.2; Sodium 140; TSH 2.07   Recent Lipid Panel Lab Results  Component Value Date/Time   CHOL 144 12/09/2020 08:35 AM   TRIG 157.0 (H) 12/09/2020 08:35 AM   HDL 31.50 (L) 12/09/2020 08:35 AM   CHOLHDL 5 12/09/2020 08:35 AM   LDLCALC 81 12/09/2020 08:35 AM   LDLDIRECT 142.7 12/10/2011 02:36 PM    Wt Readings from Last 3 Encounters:  02/18/21 243 lb (110.2 kg)  02/13/21 242 lb (109.8 kg)  02/11/21 244 lb 12.8 oz (111 kg)     Risk Assessment/Calculations:    CHA2DS2-VASc Score = 4  This indicates a 4.8% annual risk of stroke. The patient's score is based upon: CHF History: No HTN History: Yes Diabetes History: No Stroke History: No Vascular Disease History: Yes Age Score: 2 Gender Score: 0    Objective:    Vital Signs:  BP 138/73   Pulse 68   Temp (!) 97 F (36.1 C)   Ht 6' (1.829 m)   Wt 243 lb (110.2 kg)   SpO2 94%   BMI 32.96 kg/m    VITAL SIGNS:  reviewed GEN:  no acute distress EYES:  sclerae anicteric, EOMI - Extraocular Movements Intact RESPIRATORY:  normal respiratory effort, symmetric expansion CARDIOVASCULAR:  no peripheral edema SKIN:  no rash, lesions or ulcers. MUSCULOSKELETAL:  no obvious deformities. NEURO:  alert and oriented x 3, no obvious focal deficit PSYCH:  normal affect  ASSESSMENT & PLAN:    OSA - The patient is tolerating PAP therapy well without any problems. The PAP download was reviewed today and showed an AHI of  4/hr on auto PAP with 53% compliance in using more than 4 hours nightly.  The patient has been using and benefiting from PAP use and will continue to benefit from therapy.  -I have encouraged him to be more compliant with his device -part of his problem is intolerance to the FFM.  I has  mild OSA and so I think we may be able to get away with using a nasal pillow mask with chin strap which I will order today -I encouraged him to titrate his humidity for dry mouth and nose  2.  HTN -his BP is adequately controlled on exam today -he will continue on Lopressor 12.50m BID and Quinapril 259mdaily  3.  Obesity -we discussed the role that obesity plays in PAF and OSA -I have encouraged him to get into a routine exercise program and cut back on carbs and portions.     COVID-19 Education: The signs and symptoms of COVID-19 were discussed with the patient and how to seek care for testing (follow up with PCP or arrange E-visit).  The importance of social distancing was discussed today.  Time:   Today, I have spent 15 minutes with the patient with telehealth technology discussing the above problems and another 10 minutes revieweig sleep study and PAP download.     Medication Adjustments/Labs and Tests Ordered: Current medicines are reviewed at length with the patient today.  Concerns regarding medicines are outlined above.   Tests Ordered: No orders of the defined types were placed in this encounter.   Medication Changes: No orders of the defined types were placed in this encounter.   Follow Up: Virtual visit 4 weeks  Signed, TrFransico HimMD  02/18/2021 8:46 AM    [place]

## 2021-02-18 NOTE — Telephone Encounter (Signed)
-----   Message from Sueanne Margarita, MD sent at 02/18/2021  8:57 AM EDT ----- Please order a  nasal pillow mask with chin strap

## 2021-02-18 NOTE — Patient Instructions (Signed)
Medication Instructions:  Your physician recommends that you continue on your current medications as directed. Please refer to the Current Medication list given to you today.  *If you need a refill on your cardiac medications before your next appointment, please call your pharmacy*   Follow-Up: At [facility], you and your health needs are our priority.  As part of our continuing mission to provide you with exceptional heart care, we have created designated Provider Care Teams.  These Care Teams include your primary Cardiologist (physician) and Advanced Practice Providers (APPs -  Physician Assistants and Nurse Practitioners) who all work together to provide you with the care you need, when you need it.  Your next appointment:   4 week(s)  The format for your next appointment:   Virtual Visit   Provider:   Traci Turner, MD    

## 2021-02-18 NOTE — Telephone Encounter (Signed)
Order placed to [facility] via community message. 

## 2021-03-05 DIAGNOSIS — M9901 Segmental and somatic dysfunction of cervical region: Secondary | ICD-10-CM | POA: Diagnosis not present

## 2021-03-05 DIAGNOSIS — M5430 Sciatica, unspecified side: Secondary | ICD-10-CM | POA: Diagnosis not present

## 2021-03-05 DIAGNOSIS — M9903 Segmental and somatic dysfunction of lumbar region: Secondary | ICD-10-CM | POA: Diagnosis not present

## 2021-03-05 DIAGNOSIS — M5440 Lumbago with sciatica, unspecified side: Secondary | ICD-10-CM | POA: Diagnosis not present

## 2021-03-11 ENCOUNTER — Encounter: Payer: Self-pay | Admitting: Family Medicine

## 2021-03-12 MED ORDER — GABAPENTIN 100 MG PO CAPS: 100.0000 mg | ORAL_CAPSULE | Freq: Every day | ORAL | 3 refills | Status: DC

## 2021-03-18 ENCOUNTER — Telehealth: Payer: Medicare Other

## 2021-03-18 ENCOUNTER — Other Ambulatory Visit: Payer: Self-pay

## 2021-03-18 ENCOUNTER — Ambulatory Visit: Payer: Medicare Other | Admitting: Cardiology

## 2021-03-18 ENCOUNTER — Encounter: Payer: Self-pay | Admitting: Cardiology

## 2021-03-18 VITALS — BP 140/86 | HR 55 | Ht 72.0 in | Wt 242.0 lb

## 2021-03-18 DIAGNOSIS — G4733 Obstructive sleep apnea (adult) (pediatric): Secondary | ICD-10-CM | POA: Diagnosis not present

## 2021-03-18 DIAGNOSIS — I10 Essential (primary) hypertension: Secondary | ICD-10-CM | POA: Diagnosis not present

## 2021-03-18 DIAGNOSIS — E669 Obesity, unspecified: Secondary | ICD-10-CM

## 2021-03-18 NOTE — Addendum Note (Signed)
Addended by: Patterson Hammersmith A on: 03/18/2021 08:50 AM   Modules accepted: Orders

## 2021-03-18 NOTE — Patient Instructions (Signed)
Medication Instructions:  Your physician recommends that you continue on your current medications as directed. Please refer to the Current Medication list given to you today.  *If you need a refill on your cardiac medications before your next appointment, please call your pharmacy*  Follow-Up: At [facility], you and your health needs are our priority.  As part of our continuing mission to provide you with exceptional heart care, we have created designated Provider Care Teams.  These Care Teams include your primary Cardiologist (physician) and Advanced Practice Providers (APPs -  Physician Assistants and Nurse Practitioners) who all work together to provide you with the care you need, when you need it.  Your next appointment:   6 month(s)  The format for your next appointment:   In Person  Provider:   Traci Turner, MD   

## 2021-03-18 NOTE — Progress Notes (Addendum)
Date:  03/18/2021   ID:  Brent Taylor, DOB [birth year], MRN [id number] The patient was identified using 2 identifiers. PCP:  Brent Barrack, MD   [facility] Providers Cardiologist:  Brent Lean, MD Sleep Medicine:  Brent Him, MD     Evaluation Performed:  Follow-Up Visit  Chief Complaint:  OSA, HTN and obesity  History of Present Illness:    Brent Taylor is a 80 y.o. male with a hx of DM, HTN, PAF and HLD who was referred for home sleep study due to atrial fibrillation.  He was found to have mild OSA with an AHI of 14.1/hr and O2 sats as low as 83%.  He was started on auto CPAP but was intolerant to the [facility] and had a lot of nasal dryness.  I switched Taylor to a nasal pillow mask and instructed Taylor to adjust his humidity in his device.  He is here today for followup.  Unfortunately he has not gotten the nasal pillow mask yet.  He thinks he is getting more used to it but the mask still really bothers Taylor.   Past Medical History:  Diagnosis Date  . Cancer (HCC)    hx - melanoma  . Chicken pox   . Colon polyps   . Depression    No need for therapy.  Improved  . Diabetes ([place])   . Diverticula, colon   . Hemorrhoids   . Hyperlipidemia   . Hypertension   . Sepsis ([place])   . Tinnitus   . UTI (lower urinary tract infection)    Past Surgical History:  Procedure Laterality Date  . MELANOMA EXCISION     LEFT CHEST  . MELANOMA EXCISION       Current Meds  Medication Sig  . apixaban (ELIQUIS) 5 MG TABS tablet Take 1 tablet (5 mg total) by mouth 2 (two) times daily.  . Blood Glucose Monitoring Suppl (ONE TOUCH ULTRA 2) w/Device KIT Use daily to check blood sugar.  . diltiazem (CARDIZEM) 30 MG tablet Take 1 tablet every 4 hours AS NEEDED for afib heart rate >100 as long as top blood pressure >100.  [name] FLUoxetine (PROZAC) 40 MG capsule Take 1 capsule (40 mg total) by mouth daily.  [name] gabapentin (NEURONTIN) 100 MG capsule Take 1 capsule (100 mg total) by mouth at bedtime.   [name] glucose blood test strip Use to test blood sugars daily. Dx: E11.9  . ibuprofen (ADVIL,MOTRIN) 200 MG tablet Take 600 mg by mouth as needed for moderate pain.  . Lancets (ONETOUCH ULTRASOFT) lancets Use to test blood sugars daily. Dx: E11.9  . metoprolol tartrate (LOPRESSOR) 25 MG tablet Take 0.5 tablets (12.5 mg total) by mouth 2 (two) times daily.  . pantoprazole (PROTONIX) 20 MG tablet Take 1 tablet (20 mg total) by mouth daily.  . pravastatin (PRAVACHOL) 40 MG tablet Take 1 tablet (40 mg total) by mouth daily.  . quinapril (ACCUPRIL) 20 MG tablet Take 1 tablet (20 mg total) by mouth at bedtime.  . sildenafil (VIAGRA) 100 MG tablet Take 1 tablet (100 mg total) by mouth daily as needed for erectile dysfunction.  . traMADol (ULTRAM) 50 MG tablet Take 100 mg by mouth every 12 (twelve) hours as needed for severe pain.     Allergies:   Lipitor [atorvastatin calcium]   Social History   Tobacco Use  . Smoking status: Never Smoker  . Smokeless tobacco: Never Used  Substance Use Topics  . Alcohol use: No  . Drug use:  No     Family Hx: The patient's family history includes Heart Problems in his mother.   ROS:   Please see the history of present illness.     All other systems reviewed and are negative.   Prior CV studies:   The following studies were reviewed today:  PAP compliance download  Labs/Other Tests and Data Reviewed:    EKG:  No ECG reviewed.  Recent Labs: 12/09/2020: ALT 22; BUN 20; Creatinine, Ser 1.08; Hemoglobin 15.9; Platelets 176.0; Potassium 4.2; Sodium 140; TSH 2.07   Recent Lipid Panel Lab Results  Component Value Date/Time   CHOL 144 12/09/2020 08:35 AM   TRIG 157.0 (H) 12/09/2020 08:35 AM   HDL 31.50 (L) 12/09/2020 08:35 AM   CHOLHDL 5 12/09/2020 08:35 AM   LDLCALC 81 12/09/2020 08:35 AM   LDLDIRECT 142.7 12/10/2011 02:36 PM    Wt Readings from Last 3 Encounters:  03/18/21 242 lb (109.8 kg)  02/18/21 243 lb (110.2 kg)  02/13/21 242 lb (109.8  kg)     Risk Assessment/Calculations:    CHA2DS2-VASc Score = 4  This indicates a 4.8% annual risk of stroke. The patient's score is based upon: CHF History: No HTN History: Yes Diabetes History: No Stroke History: No Vascular Disease History: Yes Age Score: 2 Gender Score: 0    Objective:    Vital Signs:  BP 140/86   Pulse (!) 55   Ht 6' (1.829 m)   Wt 242 lb (109.8 kg)   SpO2 97%   BMI 32.82 kg/m    GEN: Well nourished, well developed in no acute distress HEENT: Normal NECK: No JVD; No carotid bruits LYMPHATICS: No lymphadenopathy CARDIAC:irreuglarly irregular , no murmurs, rubs, gallops RESPIRATORY:  Clear to auscultation without rales, wheezing or rhonchi  ABDOMEN: Soft, non-tender, non-distended MUSCULOSKELETAL:  No edema; No deformity  SKIN: Warm and dry NEUROLOGIC:  Alert and oriented x 3 PSYCHIATRIC:  Normal affect    ASSESSMENT & PLAN:    1.  OSA - The patient is tolerating PAP therapy but does not tolerate the FFM and has not gotten his nasal pillow mask yet.   The PAP download performed by his DME was personally reviewed and interpreted by me today and showed an AHI of 2.6/hr on auto PAP  with 43% compliance in using more than 4 hours nightly.  The patient has been using and benefiting from PAP use and will continue to benefit from therapy.  -He will call me a few weeks after starting the nasal pillow mask to see how it is going -I am calling his DME today to find out why he has not gotten the new mask -repeat download in 4 weeks -we did discuss the Inspire device but his AHI was not high enough (AHI needs to be > 15/hr)  2.  HTN -BP borderline controlled on exam today but at home runs lower around 135/50's -I have asked Taylor to try to check it a few times weekly and if it starts staying in the 140/80's he will let me know -Continue prescription drug management with Lopressor 12.46m BID and Quinapril 271mdaily with PRN refills -I have personally reviewed  and interpreted outside labs performed by patient's PCP which showed SCr 1.08, K+ 4.2 and TSH was 2 from March 2022  3.  Obesity -we discussed the role that obesity plays in PAF and OSA -I have encouraged Taylor to get into a routine exercise program and cut back on carbs and portions.  Medication Adjustments/Labs and Tests Ordered: Current medicines are reviewed at length with the patient today.  Concerns regarding medicines are outlined above.   Tests Ordered: No orders of the defined types were placed in this encounter.   Medication Changes: No orders of the defined types were placed in this encounter.   Follow Up: 6 months  Signed, Brent Him, MD  03/18/2021 8:39 AM    [place]

## 2021-03-19 ENCOUNTER — Telehealth: Payer: Medicare Other

## 2021-03-19 ENCOUNTER — Telehealth: Payer: Self-pay | Admitting: *Deleted

## 2021-03-19 NOTE — Telephone Encounter (Signed)
  Care Management   Follow Up Note   03/19/2021 Name: Brent Taylor MRN: [id number] DOB: [birth year]   Referred by: [name], MD Reason for referral : Chronic Care Management (DM, CHF)   An unsuccessful telephone outreach was attempted today. The patient was referred to the case management team for assistance with care management and care coordination.   Follow Up Plan:  RNCM will seek assistance from Care Guides to help reschedule appointment within the next 30 days.  Hubert Azure RN, MSN RN Care Management Coordinator  [facility] [phone] [email or web address]

## 2021-03-23 ENCOUNTER — Telehealth: Payer: Self-pay | Admitting: *Deleted

## 2021-03-23 NOTE — Telephone Encounter (Signed)
Reached out to resupply at [place] to ask why the patient never received his mask when the order was sent on 02-18-21 and was informed the patient has to call in for his supplies so that they are speaking directly to the patient. I asked if someone could call the patient and get his supplies coming to him and [employer] said they will call the patient.

## 2021-03-23 NOTE — Telephone Encounter (Signed)
-----   Message from Antonieta Iba, RN sent at 03/18/2021  8:44 AM EDT ----- Dr.Turner would like for you to call the patient's DME to figure out why he has not received his new mask. She would like to get a D/L in 4 weeks after he gets his mask. Thanks!

## 2021-03-30 DIAGNOSIS — M5440 Lumbago with sciatica, unspecified side: Secondary | ICD-10-CM | POA: Diagnosis not present

## 2021-03-30 DIAGNOSIS — M9901 Segmental and somatic dysfunction of cervical region: Secondary | ICD-10-CM | POA: Diagnosis not present

## 2021-03-30 DIAGNOSIS — M5430 Sciatica, unspecified side: Secondary | ICD-10-CM | POA: Diagnosis not present

## 2021-03-30 DIAGNOSIS — M9903 Segmental and somatic dysfunction of lumbar region: Secondary | ICD-10-CM | POA: Diagnosis not present

## 2021-03-31 ENCOUNTER — Ambulatory Visit (INDEPENDENT_AMBULATORY_CARE_PROVIDER_SITE_OTHER): Payer: Medicare Other

## 2021-03-31 DIAGNOSIS — I152 Hypertension secondary to endocrine disorders: Secondary | ICD-10-CM

## 2021-03-31 DIAGNOSIS — E1159 Type 2 diabetes mellitus with other circulatory complications: Secondary | ICD-10-CM | POA: Diagnosis not present

## 2021-03-31 NOTE — Patient Instructions (Signed)
[name],  Thank you for talking with me today. I have included our care plan/goals in the following pages.   Please review and call me at [phone] with any questions.  Thanks! Ellin Mayhew, Pharm.D., BCGP Clinical Pharmacist [facility] [place] [phone] Patient Care Plan: Heart Failure (Adult)     Problem Identified: Heart Failure Management   Priority: Medium     Long-Range Goal: Symptom Exacerbation Prevented or Minimized   Start Date: 12/04/2020  Expected End Date: 08/10/2021  This Visit's Progress: On track  Recent Progress: On track  Priority: Medium  Note:   Current Barriers:  Knowledge deficits related to basic heart failure pathophysiology and self care management as evidenced by patient not weighing himself daily.  Patient stating he is now weighing himself daily.  Weight this morning was 244 pounds.  Does report no recent episode of rapid heart rates that he has noticed.  Denies any lower extremity edema or shortness of breath.  Continues to monitor blood pressures (130/80).  States he has tried to wear CPAP at night, but feels it does not help him rest.   Nurse Case Manager Clinical Goal(s):  Over the next 90 days, patient will weigh self daily and record Over the next 120 days, patient will verbalize understanding of Heart Failure Action Plan and when to call doctor Interventions:  Collaboration with [name], MD regarding development and update of comprehensive plan of care as evidenced by provider attestation and co-signature Inter-disciplinary care team collaboration (see longitudinal plan of care) Basic overview and discussion of pathophysiology of Heart Failure Provided verbal education on low sodium heart healthy carbohydrate modified diabetic diet Reviewed signs and symptoms of heart failure and discussed Heart Failure Action Plan  Rediscussed importance of daily weight monitoring and when to  call provider based on weight Encouraged patient to view EMMI Heart Failure  and daily weight monitoring educational video  Encouraged to continue to monitor heart rate for increase rate and being out of rhythm  Reviewed medications and encouraged medication compliance Discussed CPAP and varying mask and encouraged patient to discuss with provider Sending copy of heart failure action plan Encouraged continued blood pressure monitoring and notifying provider for sustained elevations Encouraged to keep and attend scheduled medical appointments No recommendations/changes Patient Goals/Self-Care Activities Over the next 90 days, patient will:  Call office if I gain more than 2 pounds in one day or 5 pounds in one week Weigh myself daily, at same time each day and record in log Try stick with low salt heart healthy carbohydrate modified diabetic diet Monitor yourself for rapid irregular heart rate, taking prn medication if indicated and notifying provider Take weight log with you to medical appointments Discuss CPAP and mask with provider Follow Up Plan: The care management team will reach out to the patient again over the next 45 business days.         Patient Care Plan: Chronic Pain (Adult)  Completed 02/12/2021   Problem Identified: Increase in episodes of chronic pain and feelings of being tired more Resolved 02/12/2021  Priority: High     Long-Range Goal: Pain Management Plan Developed and Energy Increased Completed 02/12/2021  Start Date: 12/04/2020  Expected End Date: 05/09/2021  This Visit's Progress: On track  Recent Progress: On track  Priority: High  Note:   Current Barriers:  Ineffective Self Health Maintenance of chronic pain and energy level; Patient reports pain is managed with current  dose of Gabapentin.  Denies pain at this time.  Also reports increase in energy level, able to increase activity and exercise as tolerated.  Goal completed Knowledge Deficits related to short  term plan for care coordination needs and long term plans for chronic disease management needs Clinical Goal(s):  Collaboration with [name], MD regarding development and update of comprehensive plan of care as evidenced by provider attestation and co-signature Inter-disciplinary care team collaboration (see longitudinal plan of care) Over the next 120 days, patient will work with care management team to address care coordination and chronic disease management needs related to increased pain and feelings of tiredness   Interventions:  Evaluation of current treatment plan related to chronic pain self-management and patient's adherence to plan as established by provider. Discussed plans with patient for ongoing care management follow up and provided patient with direct contact information for care management team Reviewed medications and encouraged medication compliance; discussed limited to no use of Ibuprofen while on Eliquis Encouraged to continue to work with  [facility] Pharmacist for questions and concerns related to medication reconciliation and medication management Encouraged continued diversional activities like prayer and meditation Encouraged patient to increase activity as tolerated with reduced pain Discussed and encouraged patient to develop pain management plan and set pain goal Encouraged to monitor pain triggers and what helps treat/manage pain Reviewed sleep pattern and encouraged at least 6-8 hours nightly sleep Discussed history of sleep apnea and role of CPAP, encouraged patient to discuss with providers Encouraged to review education on Fatigue previously sent Patient Goals/Self Care Activities:  Over the next 30 days, patient will:  Develop a personal pain management plan and establish pain goal Plan exercise or activity when pain is best controlled Prioritize tasks for the day Track times pain is worst and when it is best Track what makes the pain worse and what makes  it better  Take medications as prescribed Eat healthy Limit daytime naps Practice relaxation or meditation daily  Review EMMI Sleep Apnea Video Follow Up Plan:  This goal is completed/resolved 02/12/21       Patient Care Plan: CCM Pharmacy Care Plan     Problem Identified: Hypertension, Hyperlipidemia, Diabetes, Atrial Fibrillation, Heart Failure, GERD and Depression      Long-Range Goal: Disease Management   Start Date: 12/09/2020  Expected End Date: 12/09/2021  This Visit's Progress: On track  Recent Progress: On track  Priority: High  Note:   Current Barriers:  Focus on improving physical activity  Pharmacist Clinical Goal(s):  Over the next 365 days, patient will verbalize ability to afford treatment regimen achieve adherence to monitoring guidelines and medication adherence to achieve therapeutic efficacy contact provider office for questions/concerns as evidenced notation of same in electronic health record through collaboration with PharmD and provider.   Interventions: 1:1 collaboration with [name], MD regarding development and update of comprehensive plan of care as evidenced by provider attestation and co-signature Inter-disciplinary care team collaboration (see longitudinal plan of care) Comprehensive medication review performed; medication list updated in electronic medical record  Hypertension (BP goal <140/90)/CHF -Controlled  -Using CPAP for OSA - getting more used to this now, no major difference in energy levels -Echo 10/15/20: EF up to 50-55% from 40-45% in 2017. Mod LVH. -Current treatment: Diltiazem 30 mg once daily as needed Laroy Apple) Metoprolol tartrate 12.5 mg twice daily (Dr Rayann Heman) Quinapril 20 mg once daily at bedtime (PCP) -Medications previously tried: quinapril 40 mg->20 mg (current), amlodipine 5 mg->10 mg  -  Current home readings: still around 140/90 -Denies hypotensive/hypertensive symptoms -Educated on BP goals  -Counseled to  monitor BP at home, document, and provide log at future appointments Recommended to continue current medication  Hyperlipidemia: (LDL goal < 100) -Controlled -HLD HTN DM HF, CAD -Current treatment: Pravastatin 40 mg once daily  -Medications previously tried: pitavastatin 4 mg, pravastatin 20 mg-> current pravastatin 40 mg  -Current dietary patterns: see DM -Current exercise habits: see DM -Reviewed Cholesterol goals;  -Reviewed side effects - no issues Recommend continue current management  Diabetes (A1c goal <7%) -Controlled -Current medications: n/a -Medications previously tried: metformin 500 mg IR once daily with breakfast (2013-2020) -Current home glucose readings fasting glucose: 110s post prandial glucose: n/a -Denies hypoglycemic/hyperglycemic symptoms -Current meal patterns: focusing more on protein over carb intake. Minimal snacking. Mentions current 'reset diet' -Current exercise: yard work, goal to get back to [facility] as patient is feeling more comfortable getting out - had not been able to get back passed few months. Encouraged routine exercise. -Counseled on diet and exercise extensively     Problem Identified: CHL AMB "PATIENT-SPECIFIC PROBLEM"      Patient Care Plan: Diabetes Type 2 (Adult)     Problem Identified: Glycemic Management (Diabetes, Type 2)   Priority: Medium     Goal: Patient will report maintaining Hgb A1C of below 7 in the next 90 days   Start Date: 02/12/2021  Expected End Date: 08/10/2021  Priority: Medium  Note:   Objective:  Lab Results  Component Value Date   HGBA1C 6.5 12/09/2020  Current Barriers:  Knowledge Deficits related to basic Diabetes pathophysiology and self care/management; Reports monitoring blood sugars about every other day, with fasting ranges of 110-125.  Denies any episodes of hypo or hyperglycemia.  Latest Hgb A1C slightly increased to 6.5. Case Manager Clinical Goal(s):  patient will demonstrate improved adherence to  prescribed treatment plan for diabetes self care/management as evidenced by: every other day monitoring and recording of CBG; adherence to ADA/ carb modified diet; adherence to prescribed medication regimen; contacting provider for new or worsened symptoms or questions Interventions:  Collaboration with [name], MD regarding development and update of comprehensive plan of care as evidenced by provider attestation and co-signature Inter-disciplinary care team collaboration (see longitudinal plan of care) Provided education to patient about basic DM disease process Reviewed medications with patient and discussed importance of medication adherence Discussed plans with patient for ongoing care management follow up and provided patient with direct contact information for care management team Provided patient with verbal education related to hypo and hyperglycemia and importance of correct treatment; and self-awareness of signs/symptoms of hypo or hyperglycemia encouraged Advised patient, providing education and rationale, to check cbg every other day and record, calling PCP for findings outside established parameters.   Barriers to adherence to treatment plan identified Blood glucose monitoring encouraged; blood glucose readings reviewed; - use of blood glucose monitoring log promoted Mutual A1C goal set or reviewed; Discussed ways to help maintain current A1C Congratulated patient on current A1C (although slightly increased, still within goal) Encouraged to attend all scheduled provider appointments Discussed and encouraged to adhere to prescribed ADA/carb modified Encouraged to increase activity as tolerated No immediate recommendations/changes Patient Goals/Self-Care Activities: Check blood sugar at least 3 times a week Check blood sugar if I feel it is too high or too low Enter blood sugar readings and medication into daily log Take the blood sugar log to all doctor visits  Contact  primary care provider  for sustained elevations Follow Up Plan: The care management team will reach out to the patient again over the next 45 business days.       The patient verbalized understanding of instructions provided today and agreed to receive a MyChart copy of patient instruction and/or educational materials. Telephone follow up appointment with pharmacy team member scheduled for: See next appointment with "Care Management Staff" under "What's Next" below.

## 2021-03-31 NOTE — Progress Notes (Signed)
Chronic Care Management Pharmacy Note  03/31/2021 Name:  Brent Taylor MRN:  [id number] DOB:  07-22-1941  Recommendations/Changes made from today's visit: No Changes  Subjective: Brent Taylor is an 80 y.o. year old male who is a primary patient of Brent Barrack, MD.  The CCM team was consulted for assistance with disease management and care coordination needs.    Engaged with patient by telephone for follow up visit in response to provider referral for pharmacy case management and/or care coordination services.   Consent to Services:  The patient was given information about Chronic Care Management services, agreed to services, and gave verbal consent prior to initiation of services.  Please see initial visit note for detailed documentation.   Patient Care Team: Brent Barrack, MD as PCP - General (Family Medicine) Werner Lean, MD as PCP - Cardiology (Cardiology) Sueanne Margarita, MD as PCP - Sleep Medicine (Cardiology) Madelin Rear, [facility] (Pharmacist) Madelin Rear, [facility] as Pharmacist (Pharmacist) Leona Singleton, RN as [facility] visits: None in previous 6 months  Objective:  Lab Results  Component Value Date   CREATININE 1.08 12/09/2020   CREATININE 1.20 02/19/2020   CREATININE 1.19 11/05/2019    Lab Results  Component Value Date   HGBA1C 6.5 12/09/2020   Last diabetic Eye exam:  Lab Results  Component Value Date/Time   HMDIABEYEEXA No Retinopathy 05/08/2018 12:24 PM    Last diabetic Foot exam:  Lab Results  Component Value Date/Time   HMDIABFOOTEX NORMAL 12/10/2011 12:00 AM        Component Value Date/Time   CHOL 144 12/09/2020 0835   TRIG 157.0 (H) 12/09/2020 0835   HDL 31.50 (L) 12/09/2020 0835   CHOLHDL 5 12/09/2020 0835   VLDL 31.4 12/09/2020 0835   LDLCALC 81 12/09/2020 0835   LDLDIRECT 142.7 12/10/2011 1436    Hepatic Function Latest Ref Rng & Units 12/09/2020 02/19/2020 11/05/2019  Total Protein 6.0 - 8.3 g/dL 6.5 6.9 6.3   Albumin 3.5 - 5.2 g/dL 4.1 4.3 4.1  AST 0 - 37 U/L $Remo'24 29 29  'PpoJs$ ALT 0 - 53 U/L 22 34 29  Alk Phosphatase 39 - 117 U/L 55 61 52  Total Bilirubin 0.2 - 1.2 mg/dL 0.6 0.7 0.7    Lab Results  Component Value Date/Time   TSH 2.07 12/09/2020 08:35 AM   TSH 2.42 02/19/2020 01:29 PM   FREET4 1.06 11/09/2015 10:15 PM    CBC Latest Ref Rng & Units 12/09/2020 02/19/2020 11/05/2019  WBC 4.0 - 10.5 K/uL 5.7 8.1 5.2  Hemoglobin 13.0 - 17.0 g/dL 15.9 16.6 15.8  Hematocrit 39.0 - 52.0 % 48.5 49.7 48.5  Platelets 150.0 - 400.0 K/uL 176.0 196.0 177.0    No results found for: VD25OH  Clinical ASCVD:  The ASCVD Risk score Brent Taylor DC Jr., et al., 2013) failed to calculate for the following reasons:   The 2013 ASCVD risk score is only valid for ages 71 to 72    Social History   Tobacco Use  Smoking Status Never  Smokeless Tobacco Never   BP Readings from Last 3 Encounters:  03/18/21 140/86  02/18/21 138/73  02/13/21 130/70   Pulse Readings from Last 3 Encounters:  03/18/21 (!) 55  02/18/21 68  02/13/21 78   Wt Readings from Last 3 Encounters:  03/18/21 242 lb (109.8 kg)  02/18/21 243 lb (110.2 kg)  02/13/21 242 lb (109.8 kg)    Assessment: Review of patient past medical history, allergies,  medications, health status, including review of consultants reports, laboratory and other test data, was performed as part of comprehensive evaluation and provision of chronic care management services.   SDOH:  (Social Determinants of Health) assessments and interventions performed: Yes   CCM Care Plan  Allergies  Allergen Reactions   Lipitor [Atorvastatin Calcium]     Muscle ache    Medications Reviewed Today     Reviewed by Madelin Rear, [facility] (Pharmacist) on 03/31/21 at 1341  Med List Status: <None>   Medication Order Taking? Sig Documenting Provider Last Dose Status Informant  apixaban (ELIQUIS) 5 MG TABS tablet [id number] No Take 1 tablet (5 mg total) by mouth 2 (two) times daily. Thompson Grayer, MD Taking Active   Blood Glucose Monitoring Suppl (ONE TOUCH ULTRA 2) w/Device KIT [id number] No Use daily to check blood sugar. Brent Barrack, MD Taking Active   diltiazem (CARDIZEM) 30 MG tablet [id number] No Take 1 tablet every 4 hours AS NEEDED for afib heart rate >100 as long as top blood pressure >100. Sherran Needs, NP Taking Active   FLUoxetine (PROZAC) 40 MG capsule [id number] No Take 1 capsule (40 mg total) by mouth daily. Brent Barrack, MD Taking Active            Med Note Baltazar Najjar Feb 11, 2021  1:32 PM)    gabapentin (NEURONTIN) 100 MG capsule [id number] No Take 1 capsule (100 mg total) by mouth at bedtime. Brent Barrack, MD Taking Active   glucose blood test strip [id number] No Use to test blood sugars daily. Dx: E11.9 Brent Barrack, MD Taking Active   ibuprofen (ADVIL,MOTRIN) 200 MG tablet [id number] No Take 600 mg by mouth as needed for moderate pain. [provider] Taking Active   Lancets [facility] ULTRASOFT) lancets [id number] No Use to test blood sugars daily. Dx: E11.9 Brent Barrack, MD Taking Active   metoprolol tartrate (LOPRESSOR) 25 MG tablet [id number] No Take 0.5 tablets (12.5 mg total) by mouth 2 (two) times daily. Thompson Grayer, MD Taking Active   pantoprazole (PROTONIX) 20 MG tablet [id number] No Take 1 tablet (20 mg total) by mouth daily. Brent Barrack, MD Taking Active   pravastatin (PRAVACHOL) 40 MG tablet [id number] No Take 1 tablet (40 mg total) by mouth daily. Brent Barrack, MD Taking Active   quinapril (ACCUPRIL) 20 MG tablet [id number] No Take 1 tablet (20 mg total) by mouth at bedtime. Brent Barrack, MD Taking Active   sildenafil (VIAGRA) 100 MG tablet [id number] No Take 1 tablet (100 mg total) by mouth daily as needed for erectile dysfunction. Thompson Grayer, MD Taking Active   traMADol (ULTRAM) 50 MG tablet [id number] No Take 100 mg by mouth every 12 (twelve) hours as needed for severe pain. [provider] Taking  Active Self            Patient Active Problem List   Diagnosis Date Noted   Thoracic aortic aneurysm without rupture ([place]) 02/13/2021   Mixed hyperlipidemia 02/13/2021   OSA (obstructive sleep apnea) 12/09/2020   Sensorineural hearing loss (SNHL) of left ear with restricted hearing of right ear 08/19/2020   Subjective tinnitus, bilateral 08/19/2020   Atrial fibrillation ([place]) 06/10/2020   Degeneration of lumbar intervertebral disc 03/31/2020   Low back pain 03/11/2020   Spinal stenosis of lumbar region 08/29/2019   Type 2 diabetes mellitus with vascular disease ([place]) 10/20/2016   Hypertension associated with diabetes ([place]) 11/20/2015   Essential  hypertension    Coronary artery disease 12/10/2011   Dyslipidemia associated with type 2 diabetes mellitus ([place]) 12/10/2011   Major depression in remission ([place]) 12/10/2011   GERD (gastroesophageal reflux disease) 12/10/2011   Hx of colonic polyps 12/10/2011   Melanoma ([place]) 12/10/2011    Immunization History  Administered Date(s) Administered   Influenza Split 07/14/2012   Influenza, High Dose Seasonal PF 07/28/2015, 09/14/2016, 10/07/2017, 07/11/2018, 05/31/2019   Influenza,inj,Quad PF,6+ Mos 07/16/2013, 07/25/2014   Influenza,inj,quad, With Preservative 07/16/2019   PFIZER(Purple Top)SARS-COV-2 Vaccination 11/19/2019, 12/14/2019, 08/22/2020   Pneumococcal Conjugate-13 03/13/2014   Pneumococcal Polysaccharide-23 07/16/2013   Tetanus 03/13/2014    Conditions to be addressed/monitored: Hypertension, Hyperlipidemia, Diabetes, Atrial Fibrillation, Heart Failure, GERD and Depression  Current Barriers:  Focus on improving physical activity  Pharmacist Clinical Goal(s):  Over the next 365 days, patient will verbalize ability to afford treatment regimen achieve adherence to monitoring guidelines and medication adherence to achieve therapeutic efficacy contact provider office for questions/concerns as evidenced notation of same in  electronic health record through collaboration with PharmD and provider.  No Changes  Interventions: 1:1 collaboration with Brent Barrack, MD regarding development and update of comprehensive plan of care as evidenced by provider attestation and co-signature Inter-disciplinary care team collaboration (see longitudinal plan of care) Comprehensive medication review performed; medication list updated in electronic medical record  Hypertension (BP goal <140/90)/CHF -Controlled  -Using CPAP for OSA - getting more used to this now, no major difference in energy levels -Echo 10/15/20: EF up to 50-55% from 40-45% in 2017. Mod LVH. -Current treatment: Diltiazem 30 mg once daily as needed Laroy Apple) Metoprolol tartrate 12.5 mg twice daily (Dr Rayann Heman) Quinapril 20 mg once daily at bedtime (PCP) -Medications previously tried: quinapril 40 mg->20 mg (current), amlodipine 5 mg->10 mg  -Current home readings: still around 140/90 -Denies hypotensive/hypertensive symptoms -Educated on BP goals  -Counseled to monitor BP at home, document, and provide log at future appointments Recommended to continue current medication  Hyperlipidemia: (LDL goal < 100) -Controlled -HLD HTN DM HF, CAD -Current treatment: Pravastatin 40 mg once daily  -Medications previously tried: pitavastatin 4 mg, pravastatin 20 mg-> current pravastatin 40 mg  -Current dietary patterns: see DM -Current exercise habits: see DM -Reviewed Cholesterol goals;  -Reviewed side effects - no issues Recommend continue current management  Diabetes (A1c goal <7%) -Controlled -Current medications: n/a -Medications previously tried: metformin 500 mg IR once daily with breakfast (2013-2020) -Current home glucose readings fasting glucose: 110s post prandial glucose: n/a -Denies hypoglycemic/hyperglycemic symptoms -Current meal patterns: focusing more on protein over carb intake. Minimal snacking. Mentions current 'reset diet' -Current  exercise: yard work, goal to get back to [facility] as patient is feeling more comfortable getting out - had not been able to get back passed few months. Encouraged routine exercise. -Counseled on diet and exercise extensively   Medication Assistance: None required.  Patient affirms current coverage meets needs.  Patient's preferred pharmacy is:  [id number] - [address], [place] [address] Phone: [phone] Fax: [phone]  Follow Up:  Patient agrees to Care Plan and Follow-up.  Plan:  [facility] f/u call 6 months, CPA BP/exercise call  3 months  Future Appointments  Date Time Provider [place]  08/17/2021  1:00 PM [facility] HEALTH COACH [facility] PEC  09/17/2021 10:00 AM Sueanne Margarita, MD [facility]  10/16/2021  9:35 AM [facility]-CV [facility] ECHO 1 [id number] [facility]   Madelin Rear, PharmD,  CPP Clinical Pharmacist Practitioner  [facility]  [phone]

## 2021-04-01 ENCOUNTER — Telehealth: Payer: Self-pay | Admitting: *Deleted

## 2021-04-01 NOTE — Chronic Care Management (AMB) (Signed)
  Care Management   Note  04/01/2021 Name: Brent Taylor MRN: [id number] DOB: May 24, 1941  Brent Taylor is a 80 y.o. year old male who is a primary care patient of [name], MD and is actively engaged with the care management team. I reached out to [employer] by phone today to assist with re-scheduling a follow up visit with the RN Case Manager  Follow up plan: Unsuccessful telephone outreach attempt made. A HIPAA compliant phone message was left for the patient providing contact information and requesting a return call.  The care management team will reach out to the patient again over the next 7 days.  If patient returns call to provider office, please advise to call [place] at [phone].  [place]

## 2021-04-07 NOTE — Progress Notes (Signed)
Amber rescheduled for call with RNCM on 04/30/2021

## 2021-04-09 ENCOUNTER — Encounter: Payer: Self-pay | Admitting: Family Medicine

## 2021-04-09 ENCOUNTER — Telehealth: Payer: Self-pay

## 2021-04-09 NOTE — Chronic Care Management (AMB) (Signed)
    Chronic Care Management Pharmacy Assistant   Name: Brent Taylor  MRN: [id number] DOB: 1940-12-18  Reason for Encounter: Chart Review   Medications: Outpatient Encounter Medications as of 04/09/2021  Medication Sig   apixaban (ELIQUIS) 5 MG TABS tablet Take 1 tablet (5 mg total) by mouth 2 (two) times daily.   Blood Glucose Monitoring Suppl (ONE TOUCH ULTRA 2) w/Device KIT Use daily to check blood sugar.   diltiazem (CARDIZEM) 30 MG tablet Take 1 tablet every 4 hours AS NEEDED for afib heart rate >100 as long as top blood pressure >100.   FLUoxetine (PROZAC) 40 MG capsule Take 1 capsule (40 mg total) by mouth daily.   gabapentin (NEURONTIN) 100 MG capsule Take 1 capsule (100 mg total) by mouth at bedtime.   glucose blood test strip Use to test blood sugars daily. Dx: E11.9   ibuprofen (ADVIL,MOTRIN) 200 MG tablet Take 600 mg by mouth as needed for moderate pain.   Lancets (ONETOUCH ULTRASOFT) lancets Use to test blood sugars daily. Dx: E11.9   metoprolol tartrate (LOPRESSOR) 25 MG tablet Take 0.5 tablets (12.5 mg total) by mouth 2 (two) times daily.   pantoprazole (PROTONIX) 20 MG tablet Take 1 tablet (20 mg total) by mouth daily.   pravastatin (PRAVACHOL) 40 MG tablet Take 1 tablet (40 mg total) by mouth daily.   quinapril (ACCUPRIL) 20 MG tablet Take 1 tablet (20 mg total) by mouth at bedtime.   sildenafil (VIAGRA) 100 MG tablet Take 1 tablet (100 mg total) by mouth daily as needed for erectile dysfunction.   traMADol (ULTRAM) 50 MG tablet Take 100 mg by mouth every 12 (twelve) hours as needed for severe pain.   No facility-administered encounter medications on file as of 04/09/2021.   Reviewed chart for medication changes and adherence.  No OVs, Consults, or hospital visits since last care coordination call / Pharmacist visit. No medication changes indicated  No gaps in adherence identified. Patient has follow up scheduled with pharmacy team. No further action required.  Wilford Sports CPA, CMA

## 2021-04-10 ENCOUNTER — Encounter: Payer: Self-pay | Admitting: Family Medicine

## 2021-04-14 DIAGNOSIS — M9901 Segmental and somatic dysfunction of cervical region: Secondary | ICD-10-CM | POA: Diagnosis not present

## 2021-04-14 DIAGNOSIS — M5430 Sciatica, unspecified side: Secondary | ICD-10-CM | POA: Diagnosis not present

## 2021-04-14 DIAGNOSIS — M9903 Segmental and somatic dysfunction of lumbar region: Secondary | ICD-10-CM | POA: Diagnosis not present

## 2021-04-14 DIAGNOSIS — M5440 Lumbago with sciatica, unspecified side: Secondary | ICD-10-CM | POA: Diagnosis not present

## 2021-04-14 MED ORDER — PRAVASTATIN SODIUM 40 MG PO TABS: 40.0000 mg | ORAL_TABLET | Freq: Every day | ORAL | 1 refills | Status: DC

## 2021-04-15 ENCOUNTER — Other Ambulatory Visit: Payer: Self-pay

## 2021-04-15 MED ORDER — SILDENAFIL CITRATE 100 MG PO TABS: 100.0000 mg | ORAL_TABLET | Freq: Every day | ORAL | 0 refills | Status: DC | PRN

## 2021-04-15 MED ORDER — QUINAPRIL HCL 20 MG PO TABS: 20.0000 mg | ORAL_TABLET | Freq: Every day | ORAL | 1 refills | Status: DC

## 2021-04-15 MED ORDER — PANTOPRAZOLE SODIUM 20 MG PO TBEC: 20.0000 mg | DELAYED_RELEASE_TABLET | Freq: Every day | ORAL | 3 refills | Status: DC

## 2021-04-15 MED ORDER — FLUOXETINE HCL 40 MG PO CAPS: 40.0000 mg | ORAL_CAPSULE | Freq: Every day | ORAL | 3 refills | Status: DC

## 2021-04-16 DIAGNOSIS — G4733 Obstructive sleep apnea (adult) (pediatric): Secondary | ICD-10-CM | POA: Diagnosis not present

## 2021-04-16 MED ORDER — APIXABAN 5 MG PO TABS: 5.0000 mg | ORAL_TABLET | Freq: Two times a day (BID) | ORAL | 3 refills | Status: DC

## 2021-04-17 DIAGNOSIS — G4733 Obstructive sleep apnea (adult) (pediatric): Secondary | ICD-10-CM | POA: Diagnosis not present

## 2021-04-22 ENCOUNTER — Other Ambulatory Visit: Payer: Self-pay | Admitting: Family Medicine

## 2021-04-30 ENCOUNTER — Ambulatory Visit (INDEPENDENT_AMBULATORY_CARE_PROVIDER_SITE_OTHER): Payer: Medicare Other | Admitting: *Deleted

## 2021-04-30 ENCOUNTER — Other Ambulatory Visit: Payer: Self-pay | Admitting: *Deleted

## 2021-04-30 DIAGNOSIS — E1159 Type 2 diabetes mellitus with other circulatory complications: Secondary | ICD-10-CM | POA: Diagnosis not present

## 2021-04-30 DIAGNOSIS — I5042 Chronic combined systolic (congestive) and diastolic (congestive) heart failure: Secondary | ICD-10-CM

## 2021-04-30 DIAGNOSIS — G4733 Obstructive sleep apnea (adult) (pediatric): Secondary | ICD-10-CM

## 2021-04-30 DIAGNOSIS — I10 Essential (primary) hypertension: Secondary | ICD-10-CM | POA: Diagnosis not present

## 2021-04-30 NOTE — Patient Instructions (Signed)
Visit Information  PATIENT GOALS:  Goals Addressed             This Visit's Progress    (RNCM) Monitor and Manage My Blood Sugar-Diabetes Type 2   On track    Timeframe:  Long-Range Goal Priority:  Medium Start Date: 02/12/2021                            Expected End Date:  11/10/2021                 Follow Up Date 06/09/21    Check blood sugar at least 3 times a week Check blood sugar if I feel it is too high or too low Enter blood sugar readings and medication into daily log Take the blood sugar log to all doctor visits  Contact primary care provider for sustained elevations   Why is this important?   Checking your blood sugar at home helps to keep it from getting very high or very low.  Writing the results in a diary or log helps the doctor know how to care for you.  Your blood sugar log should have the time, date and the results.  Also, write down the amount of insulin or other medicine that you take.  Other information, like what you ate, exercise done and how you were feeling, will also be helpful.     Notes:      (RNCM) Track and Manage Fluids and Swelling-Heart Failure   On track    Timeframe:  Long-Range Goal Priority:  Medium Start Date:  12/04/20                           Expected End Date:  11/10/21                Follow Up Date 06/09/21    Call office if I gain more than 2 pounds in one day or 5 pounds in one week Weigh myself daily, at same time each day and record in log Try stick with low salt heart healthy carbohydrate modified diabetic diet Monitor yourself for rapid irregular heart rate, taking prn medication if indicated and notifying provider Take weight log with you to medical appointments    Why is this important?   It is important to check your weight daily and watch how much salt and liquids you have.  It will help you to manage your heart failure.    Notes:         Patient verbalizes understanding of instructions provided today and agrees  to view in [place].   The care management team will reach out to the patient again over the next 45 business days.   Hubert Azure RN, MSN RN Care Management Coordinator  [facility] [phone] [email or web address]

## 2021-04-30 NOTE — Telephone Encounter (Signed)
Patient requesting Rx refills Tramadol  Last refilled by historical provider  Last OV 12/09/2020

## 2021-04-30 NOTE — Chronic Care Management (AMB) (Signed)
Chronic Care Management   CCM RN Visit Note  04/30/2021 Name: Brent Taylor MRN: [id number] DOB: 08-Jan-1941  Subjective: Brent Taylor is a 80 y.o. year old male who is a primary care patient of [name], MD. The care management team was consulted for assistance with disease management and care coordination needs.    Engaged with patient by telephone for follow up visit in response to provider referral for case management and/or care coordination services.   Consent to Services:  The patient was given information about Chronic Care Management services, agreed to services, and gave verbal consent prior to initiation of services.  Please see initial visit note for detailed documentation.   Patient agreed to services and verbal consent obtained.   Assessment: Review of patient past medical history, allergies, medications, health status, including review of consultants reports, laboratory and other test data, was performed as part of comprehensive evaluation and provision of chronic care management services.   SDOH (Social Determinants of Health) assessments and interventions performed:  SDOH Interventions    Flowsheet Row Most Recent Value  SDOH Interventions   Physical Activity Interventions Intervention Not Indicated        CCM Care Plan  Allergies  Allergen Reactions   Lipitor [Atorvastatin Calcium]     Muscle ache    Outpatient Encounter Medications as of 04/30/2021  Medication Sig   apixaban (ELIQUIS) 5 MG TABS tablet Take 1 tablet (5 mg total) by mouth 2 (two) times daily.   Blood Glucose Monitoring Suppl (ONE TOUCH ULTRA 2) w/Device KIT Use daily to check blood sugar.   diltiazem (CARDIZEM) 30 MG tablet Take 1 tablet every 4 hours AS NEEDED for afib heart rate >100 as long as top blood pressure >100.   FLUoxetine (PROZAC) 40 MG capsule Take 1 capsule (40 mg total) by mouth daily.   gabapentin (NEURONTIN) 100 MG capsule Take 1 capsule (100 mg total) by mouth at bedtime.    glucose blood test strip Use to test blood sugars daily. Dx: E11.9   ibuprofen (ADVIL,MOTRIN) 200 MG tablet Take 600 mg by mouth as needed for moderate pain.   Lancets (ONETOUCH ULTRASOFT) lancets Use to test blood sugars daily. Dx: E11.9   metoprolol tartrate (LOPRESSOR) 25 MG tablet Take 0.5 tablets (12.5 mg total) by mouth 2 (two) times daily.   pantoprazole (PROTONIX) 20 MG tablet Take 1 tablet (20 mg total) by mouth daily.   pravastatin (PRAVACHOL) 40 MG tablet Take 1 tablet (40 mg total) by mouth daily.   quinapril (ACCUPRIL) 20 MG tablet Take 1 tablet (20 mg total) by mouth at bedtime.   sildenafil (VIAGRA) 100 MG tablet Take 1 tablet (100 mg total) by mouth daily as needed for erectile dysfunction.   traMADol (ULTRAM) 50 MG tablet Take 100 mg by mouth every 12 (twelve) hours as needed for severe pain.   No facility-administered encounter medications on file as of 04/30/2021.    Patient Active Problem List   Diagnosis Date Noted   Thoracic aortic aneurysm without rupture ([place]) 02/13/2021   Mixed hyperlipidemia 02/13/2021   OSA (obstructive sleep apnea) 12/09/2020   Sensorineural hearing loss (SNHL) of left ear with restricted hearing of right ear 08/19/2020   Subjective tinnitus, bilateral 08/19/2020   Atrial fibrillation ([place]) 06/10/2020   Degeneration of lumbar intervertebral disc 03/31/2020   Low back pain 03/11/2020   Spinal stenosis of lumbar region 08/29/2019   Type 2 diabetes mellitus with vascular disease ([place]) 10/20/2016   Hypertension associated with diabetes (  [place]) 11/20/2015   Essential hypertension    Coronary artery disease 12/10/2011   Dyslipidemia associated with type 2 diabetes mellitus ([place]) 12/10/2011   Major depression in remission ([place]) 12/10/2011   GERD (gastroesophageal reflux disease) 12/10/2011   Hx of colonic polyps 12/10/2011   Melanoma ([place]) 12/10/2011    Conditions to be addressed/monitored:CHF and DMII  Care Plan : Heart Failure (Adult)   Updates made by Leona Singleton, RN since 04/30/2021 12:00 AM     Problem: Heart Failure Management   Priority: Medium     Long-Range Goal: Symptom Exacerbation Prevented or Minimized   Start Date: 12/04/2020  Expected End Date: 11/10/2021  This Visit's Progress: On track  Recent Progress: On track  Priority: Medium  Note:   Current Barriers:  Knowledge deficits related to basic heart failure pathophysiology and self care management as evidenced by patient not weighing himself daily.  Weight this morning was 235.6 pounds.  Does report no recent episode of rapid heart rates that he has noticed.  Denies any lower extremity edema or shortness of breath.  Continues to monitor blood pressures (130/70's).  States he is now tolerating CPAP mask. Nurse Case Manager Clinical Goal(s):  Over the next 90 days, patient will weigh self daily and record Over the next 120 days, patient will verbalize understanding of Heart Failure Action Plan and when to call doctor Interventions:  Collaboration with [name], MD regarding development and update of comprehensive plan of care as evidenced by provider attestation and co-signature Inter-disciplinary care team collaboration (see longitudinal plan of care) Basic overview and discussion of pathophysiology of Heart Failure Provided verbal education on low sodium heart healthy carbohydrate modified diabetic diet Reviewed signs and symptoms of heart failure and discussed Heart Failure Action Plan  Rediscussed importance of daily weight monitoring and when to call provider based on weight Encouraged patient to view EMMI Heart Failure  and daily weight monitoring educational video  Encouraged to continue to monitor heart rate for increase rate and being out of rhythm  Reviewed medications and encouraged medication compliance Sent copy of heart failure action plan Encouraged continued blood pressure monitoring and notifying provider for sustained  elevations Encouraged to keep and attend scheduled medical appointments Congratulated on increase in activity, going to gym and recent weight loss No recommendations/changes Patient Goals/Self-Care Activities Over the next 90 days, patient will:  Call office if I gain more than 2 pounds in one day or 5 pounds in one week Weigh myself daily, at same time each day and record in log Try stick with low salt heart healthy carbohydrate modified diabetic diet Monitor yourself for rapid irregular heart rate, taking prn medication if indicated and notifying provider Take weight log with you to medical appointments Follow Up Plan: The care management team will reach out to the patient again over the next 45 business days.         Care Plan : Diabetes Type 2 (Adult)  Updates made by Leona Singleton, RN since 04/30/2021 12:00 AM     Problem: Glycemic Management (Diabetes, Type 2)   Priority: Medium     Long-Range Goal: Patient will report maintaining Hgb A1C of below 7 in the next 90 days   Start Date: 02/12/2021  Expected End Date: 11/10/2021  Priority: Medium  Note:   Objective:  Lab Results  Component Value Date   HGBA1C 6.5 12/09/2020  Current Barriers:  Knowledge Deficits related to basic Diabetes pathophysiology and self care/management; Reports monitoring blood  sugars about every other day, with fasting ranges of 110's.  Denies any episodes of hypo or hyperglycemia.  Latest Hgb A1C slightly increased to 6.5. Case Manager Clinical Goal(s):  patient will demonstrate improved adherence to prescribed treatment plan for diabetes self care/management as evidenced by: every other day monitoring and recording of CBG; adherence to ADA/ carb modified diet; adherence to prescribed medication regimen; contacting provider for new or worsened symptoms or questions Interventions:  Collaboration with [name], MD regarding development and update of comprehensive plan of care as evidenced by  provider attestation and co-signature Inter-disciplinary care team collaboration (see longitudinal plan of care) Provided education to patient about basic DM disease process Reviewed medications with patient and discussed importance of medication adherence Discussed plans with patient for ongoing care management follow up and provided patient with direct contact information for care management team Provided patient with verbal education related to hypo and hyperglycemia and importance of correct treatment; and self-awareness of signs/symptoms of hypo or hyperglycemia encouraged Advised patient, providing education and rationale, to check cbg every other day and record, calling PCP for findings outside established parameters.   Barriers to adherence to treatment plan identified Blood glucose monitoring encouraged; blood glucose readings reviewed; - use of blood glucose monitoring log promoted Mutual A1C goal set or reviewed; Discussed ways to help maintain current A1C Congratulated patient on current A1C (although slightly increased, still within goal) Encouraged to attend all scheduled provider appointments Discussed and encouraged to adhere to prescribed ADA/carb modified Encouraged to continue to increase activity as tolerated, going to gym No immediate recommendations/changes Patient Goals/Self-Care Activities: Check blood sugar at least 3 times a week Check blood sugar if I feel it is too high or too low Enter blood sugar readings and medication into daily log Take the blood sugar log to all doctor visits  Contact primary care provider for sustained elevations Follow Up Plan: The care management team will reach out to the patient again over the next 45 business days.       Plan:The care management team will reach out to the patient again over the next 45 business days.  Hubert Azure RN, MSN RN Care Management Coordinator  [place] [phone] [email or web address]

## 2021-05-04 DIAGNOSIS — L820 Inflamed seborrheic keratosis: Secondary | ICD-10-CM | POA: Diagnosis not present

## 2021-05-04 NOTE — Telephone Encounter (Signed)
Can we clarify who has filled this for him in the past? I would be ok filling it, just want to make sure it is not being duplicated.

## 2021-05-05 NOTE — Telephone Encounter (Signed)
Called pt in regards to my chart message.  Left a message for pt to call back.  I have also sent a my chart message with follow up questions.

## 2021-05-06 ENCOUNTER — Telehealth: Payer: Self-pay

## 2021-05-06 ENCOUNTER — Other Ambulatory Visit: Payer: Self-pay

## 2021-05-06 MED ORDER — TRAMADOL HCL 50 MG PO TABS: 50.0000 mg | ORAL_TABLET | Freq: Two times a day (BID) | ORAL | 0 refills | Status: DC | PRN

## 2021-05-06 NOTE — Progress Notes (Signed)
Called and spoke with pt and pt unsure of who was prescribing it prior.

## 2021-05-06 NOTE — Progress Notes (Signed)
Please see previous refill rx message. Can we clarify who was prescribing this? We have never sent it in for him in the past. I am ok with prescribing it going forward I just want to make sure it is not being duplicated.  Brent Taylor. [name], MD 05/06/2021 9:12 AM

## 2021-05-06 NOTE — Telephone Encounter (Signed)
Tramadol sent to Dr. [name].

## 2021-05-06 NOTE — Telephone Encounter (Signed)
Called pt in regards to CP.  He reports that pain in high up on left side of shoulder.  It is dull in nature and feels sore.  Per pt pain worse in morning when he wakes up. He denies n/v, dizziness, pain in jaw/ radiating down left arm. He expressed that he cut some limbs off a tree a few days ago and pain started maybe a day after.  I advised pt that if he begins to experience symptoms that we discussed to call 911 to be evaluated.  He verbalized understanding.  I advised him to try ibuprofen or tramadol as these medication are listed on his med list.  I also advised him that if these medications do not help to follow up with his PCP.  Pt thanked me for calling him.

## 2021-05-06 NOTE — Telephone Encounter (Signed)
-----   Message from Leona Singleton, RN sent at 04/30/2021 10:20 AM EDT ----- Regarding: Medication refill Hi All,  I am the CCM (Chronic Care Management) Nurse Care Manager for [place].  Have been working with Brent Taylor for several months now.  Spoke with him today, doing well, but would like a refill for his Tramadol.  Discussed having his pharmacy contact the office, but he does not think medication has refill on it and it has been a while since the original script was written.  He requested I ask one of you guys to send in refill request for his previous Tramadol prescription to his pharmacy [facility] [id number] - [place], [place]  [address] [place] [address] Phone:  [phone] Fax:  [phone])  Thanks,  Hubert Azure RN, MSN RN Care Management Coordinator  [place] [phone] [email or web address]

## 2021-05-07 ENCOUNTER — Emergency Department (HOSPITAL_COMMUNITY)
Admission: EM | Admit: 2021-05-07 | Discharge: 2021-05-08 | Disposition: A | Discharge status: Home / Self Care | Payer: Medicare Other | Attending: Emergency Medicine | Admitting: Emergency Medicine

## 2021-05-07 ENCOUNTER — Other Ambulatory Visit: Payer: Self-pay

## 2021-05-07 ENCOUNTER — Emergency Department (HOSPITAL_COMMUNITY): Payer: Medicare Other

## 2021-05-07 ENCOUNTER — Encounter (HOSPITAL_COMMUNITY): Payer: Self-pay | Admitting: Emergency Medicine

## 2021-05-07 ENCOUNTER — Encounter: Payer: Self-pay | Admitting: Family Medicine

## 2021-05-07 DIAGNOSIS — R079 Chest pain, unspecified: Secondary | ICD-10-CM | POA: Diagnosis not present

## 2021-05-07 DIAGNOSIS — I2510 Atherosclerotic heart disease of native coronary artery without angina pectoris: Secondary | ICD-10-CM | POA: Insufficient documentation

## 2021-05-07 DIAGNOSIS — Z79899 Other long term (current) drug therapy: Secondary | ICD-10-CM | POA: Insufficient documentation

## 2021-05-07 DIAGNOSIS — E119 Type 2 diabetes mellitus without complications: Secondary | ICD-10-CM | POA: Insufficient documentation

## 2021-05-07 DIAGNOSIS — R0789 Other chest pain: Secondary | ICD-10-CM | POA: Diagnosis not present

## 2021-05-07 DIAGNOSIS — Z7901 Long term (current) use of anticoagulants: Secondary | ICD-10-CM | POA: Insufficient documentation

## 2021-05-07 DIAGNOSIS — I10 Essential (primary) hypertension: Secondary | ICD-10-CM | POA: Insufficient documentation

## 2021-05-07 DIAGNOSIS — M25512 Pain in left shoulder: Secondary | ICD-10-CM | POA: Insufficient documentation

## 2021-05-07 DIAGNOSIS — Z85828 Personal history of other malignant neoplasm of skin: Secondary | ICD-10-CM | POA: Insufficient documentation

## 2021-05-07 LAB — CBC WITH DIFFERENTIAL/PLATELET
Abs Immature Granulocytes: 0.02 10*3/uL (ref 0.00–0.07)
Basophils Absolute: 0 10*3/uL (ref 0.0–0.1)
Basophils Relative: 1 %
Eosinophils Absolute: 0.2 10*3/uL (ref 0.0–0.5)
Eosinophils Relative: 2 %
HCT: 49 % (ref 39.0–52.0)
Hemoglobin: 15.3 g/dL (ref 13.0–17.0)
Immature Granulocytes: 0 %
Lymphocytes Relative: 17 %
Lymphs Abs: 1.2 10*3/uL (ref 0.7–4.0)
MCH: 27.9 pg (ref 26.0–34.0)
MCHC: 31.2 g/dL (ref 30.0–36.0)
MCV: 89.4 fL (ref 80.0–100.0)
Monocytes Absolute: 0.7 10*3/uL (ref 0.1–1.0)
Monocytes Relative: 9 %
Neutro Abs: 5.2 10*3/uL (ref 1.7–7.7)
Neutrophils Relative %: 71 %
Platelets: 189 10*3/uL (ref 150–400)
RBC: 5.48 MIL/uL (ref 4.22–5.81)
RDW: 14.4 % (ref 11.5–15.5)
WBC: 7.3 10*3/uL (ref 4.0–10.5)
nRBC: 0 % (ref 0.0–0.2)

## 2021-05-07 LAB — BASIC METABOLIC PANEL
Anion gap: 7 (ref 5–15)
BUN: 21 mg/dL (ref 8–23)
CO2: 25 mmol/L (ref 22–32)
Calcium: 9.2 mg/dL (ref 8.9–10.3)
Chloride: 107 mmol/L (ref 98–111)
Creatinine, Ser: 1.21 mg/dL (ref 0.61–1.24)
GFR, Estimated: 60 mL/min — ABNORMAL LOW (ref >60)
Glucose, Bld: 145 mg/dL — ABNORMAL HIGH (ref 70–99)
Potassium: 4 mmol/L (ref 3.5–5.1)
Sodium: 139 mmol/L (ref 135–145)

## 2021-05-07 LAB — TROPONIN I (HIGH SENSITIVITY)
Troponin I (High Sensitivity): 10 ng/L (ref <18)
Troponin I (High Sensitivity): 11 ng/L (ref <18)

## 2021-05-07 NOTE — Telephone Encounter (Signed)
After speaking with Dr. [name], He is requesting an order for x ray to be placed.    Once order is placed, please follow up with patient in regard.

## 2021-05-07 NOTE — ED Provider Notes (Signed)
Emergency Medicine Provider Triage Evaluation Note  Brent Taylor , a 80 y.o. male  was evaluated in triage.  Pt complains of cp. Started yesterday evening. Worse with movement. On anticoag for afib. No nausea, emesis, back pain, abdf pain, arm/ jaw pain.   Review of Systems  Positive: CP Negative: Emesis, nausea, back pain, abd pain, sob, cough, LE edema  Physical Exam  There were no vitals taken for this visit. Gen:   Awake, no distress   Resp:  Normal effort  Chest:  Old surgical incision, non tender MSK:   Moves extremities without difficulty  Other:    Medical Decision Making  Medically screening exam initiated at 6:34 PM.  Appropriate orders placed.  Brent Taylor was informed that the remainder of the evaluation will be completed by another provider, this initial triage assessment does not replace that evaluation, and the importance of remaining in the ED until their evaluation is complete.  CP  VS stable, work up started   [employer], PA-C 05/07/21 1836    Daleen Bo, MD 05/08/21 1410

## 2021-05-07 NOTE — ED Triage Notes (Signed)
Pt c/o intermittent left sided chest pain, pain worse with movement. Denies injury, no shortness of breath, cough, nausea, vomiting.

## 2021-05-07 NOTE — Telephone Encounter (Signed)
Nurse Assessment Nurse: Raenette Rover, RN, Zella Ball Date/Time (Eastern Time): 05/07/2021 3:53:15 PM Confirm and document reason for call. If symptomatic, describe symptoms. ---Caller states he has been having upper left side chest pain. He thinks he may have cracked a rib. Does the patient have any new or worsening symptoms? ---Yes Will a triage be completed? ---Yes Related visit to physician within the last 2 weeks? ---No Does the PT have any chronic conditions? (i.e. diabetes, asthma, this includes High risk factors for pregnancy, etc.) ---Yes List chronic conditions. ---htn Is this a behavioral health or substance abuse call? ---No Guidelines Guideline Title Affirmed Question Affirmed Notes Nurse Date/Time (Eastern Time) Chest Pain [1] Chest pain lasts > 5 minutes AND [2] age > 26 Raenette Rover, RN, Zella Ball 05/07/2021 3:54:37 PM Disp. Time Eilene Ghazi Time) Disposition Final User 05/07/2021 3:51:02 PM Send to Urgent Queue [place] Mote 05/07/2021 4:00:20 PM 911 Outcome Documentation Raenette Rover, RN, Zella Ball PLEASE NOTE: All timestamps contained within this report are represented as [place] Standard Time. CONFIDENTIALTY NOTICE: This fax transmission is intended only for the addressee. It contains information that is legally privileged, confidential or otherwise protected from use or disclosure. If you are not the intended recipient, you are strictly prohibited from reviewing, disclosing, copying using or disseminating any of this information or taking any action in reliance on or regarding this information. If you have received this fax in error, please notify us immediately by telephone so that we can arrange for its return to [place]. Phone: [phone], Toll-Free: [phone], Fax: [phone] Page: 2 of 2 Call Id: [id number] [place]. Time Eilene Ghazi Time) Disposition Final User Reason: Refused 05/07/2021 3:59:38 PM Call EMS 911 Now Yes Raenette Rover, RN, Herbert Deaner Disagree/Comply Disagree Caller Understands  Yes PreDisposition Call Doctor Care Advice Given Per Guideline CALL EMS 911 NOW: CARE ADVICE given per Chest Pain (Adult) guideline. Comments User: Wilson Singer, RN Date/Time (Eastern Time): 05/07/2021 4:00:55 PM Caller prefers to be seen in the office instead of going to the ER. Warm Transferred to Tammy on the back line. Referrals Warm transfer to backline

## 2021-05-08 MED ORDER — DICLOFENAC SODIUM 1 % EX GEL: 4.0000 g | Freq: Four times a day (QID) | CUTANEOUS | 0 refills | Status: DC

## 2021-05-08 NOTE — ED Provider Notes (Signed)
[facility] EMERGENCY DEPARTMENT Provider Note   CSN: [id number] Arrival date & time: 05/07/21  1756     History Chief Complaint  Patient presents with   Chest Pain    Rodriques Noll is a 80 y.o. male.  80 yo M with a cc of L shoulder pain.  Going on for past couple of days.  Sharp, comes and goes, worse with movement, rolling over in bed.  Last for seconds at at time.   Patient denies history of MI, Hx of HTN,   Denies family history of MI.  Patient denies history of PE or DVT denies hemoptysis denies unilateral lower extremity edema denies recent surgery immobilization hospitalization estrogen use or history of cancer.    The history is provided by the patient.  Chest Pain Pain location:  L lateral chest Pain quality: sharp   Pain radiates to:  Does not radiate Pain severity:  Moderate Onset quality:  Gradual Duration:  2 days Timing:  Intermittent Progression:  Worsening Chronicity:  New Relieved by:  Nothing Worsened by:  Nothing Ineffective treatments:  None tried Associated symptoms: no abdominal pain, no fever, no headache, no palpitations, no shortness of breath and no vomiting   Risk factors: no coronary artery disease       Past Medical History:  Diagnosis Date   Cancer ([place])    hx - melanoma   Chicken pox    Colon polyps    Depression    No need for therapy.  Improved   Diabetes (HCC)    Diverticula, colon    Hemorrhoids    Hyperlipidemia    Hypertension    Sepsis ([place])    Tinnitus    UTI (lower urinary tract infection)     Patient Active Problem List   Diagnosis Date Noted   Thoracic aortic aneurysm without rupture ([place]) 02/13/2021   Mixed hyperlipidemia 02/13/2021   OSA (obstructive sleep apnea) 12/09/2020   Sensorineural hearing loss (SNHL) of left ear with restricted hearing of right ear 08/19/2020   Subjective tinnitus, bilateral 08/19/2020   Atrial fibrillation ([place]) 06/10/2020   Degeneration of lumbar intervertebral  disc 03/31/2020   Low back pain 03/11/2020   Spinal stenosis of lumbar region 08/29/2019   Type 2 diabetes mellitus with vascular disease ([place]) 10/20/2016   Hypertension associated with diabetes ([place]) 11/20/2015   Essential hypertension    Coronary artery disease 12/10/2011   Dyslipidemia associated with type 2 diabetes mellitus ([place]) 12/10/2011   Major depression in remission ([place]) 12/10/2011   GERD (gastroesophageal reflux disease) 12/10/2011   Hx of colonic polyps 12/10/2011   Melanoma ([place]) 12/10/2011    Past Surgical History:  Procedure Laterality Date   MELANOMA EXCISION     LEFT CHEST   MELANOMA EXCISION         Family History  Problem Relation Age of Onset   Heart Problems Mother        Skipping    Social History   Tobacco Use   Smoking status: Never   Smokeless tobacco: Never  Substance Use Topics   Alcohol use: No   Drug use: No    Home Medications Prior to Admission medications   Medication Sig Start Date End Date Taking? Authorizing Provider  diclofenac Sodium (VOLTAREN) 1 % GEL Apply 4 g topically 4 (four) times daily. 05/08/21  Yes Deno Etienne, DO  apixaban (ELIQUIS) 5 MG TABS tablet Take 1 tablet (5 mg total) by mouth 2 (two) times daily. 04/16/21  Thompson Grayer, MD  Blood Glucose Monitoring Suppl (ONE TOUCH ULTRA 2) w/Device KIT Use daily to check blood sugar. 10/13/20   [name], MD  diltiazem (CARDIZEM) 30 MG tablet Take 1 tablet every 4 hours AS NEEDED for afib heart rate >100 as long as top blood pressure >100. 06/25/20   Sherran Needs, NP  FLUoxetine (PROZAC) 40 MG capsule Take 1 capsule (40 mg total) by mouth daily. 04/15/21   [name], MD  gabapentin (NEURONTIN) 100 MG capsule Take 1 capsule (100 mg total) by mouth at bedtime. 03/12/21   [name], MD  glucose blood test strip Use to test blood sugars daily. Dx: E11.9 10/16/20   [name], MD  ibuprofen (ADVIL,MOTRIN) 200 MG tablet Take 600 mg by mouth as needed for moderate  pain.    [provider]  Lancets [facility] ULTRASOFT) lancets Use to test blood sugars daily. Dx: E11.9 07/28/20   [name], MD  metoprolol tartrate (LOPRESSOR) 25 MG tablet Take 0.5 tablets (12.5 mg total) by mouth 2 (two) times daily. 07/28/20   Allred, Jeneen Rinks, MD  pantoprazole (PROTONIX) 20 MG tablet Take 1 tablet (20 mg total) by mouth daily. 04/15/21   [name], MD  pravastatin (PRAVACHOL) 40 MG tablet Take 1 tablet (40 mg total) by mouth daily. 04/14/21   [name], MD  quinapril (ACCUPRIL) 20 MG tablet Take 1 tablet (20 mg total) by mouth at bedtime. 04/15/21   [name], MD  sildenafil (VIAGRA) 100 MG tablet Take 1 tablet (100 mg total) by mouth daily as needed for erectile dysfunction. 04/15/21   [name], MD  traMADol (ULTRAM) 50 MG tablet Take 1 tablet (50 mg total) by mouth every 12 (twelve) hours as needed for severe pain. 05/06/21   [name], MD    Allergies    Lipitor [atorvastatin calcium]  Review of Systems   Review of Systems  Constitutional:  Negative for chills and fever.  HENT:  Negative for congestion and facial swelling.   Eyes:  Negative for discharge and visual disturbance.  Respiratory:  Negative for shortness of breath.   Cardiovascular:  Positive for chest pain. Negative for palpitations.  Gastrointestinal:  Negative for abdominal pain, diarrhea and vomiting.  Musculoskeletal:  Negative for arthralgias and myalgias.  Skin:  Negative for color change and rash.  Neurological:  Negative for tremors, syncope and headaches.  Psychiatric/Behavioral:  Negative for confusion and dysphoric mood.    Physical Exam Updated Vital Signs BP (!) 174/95   Pulse 64   Temp 98 F (36.7 C) (Oral)   Resp 20   SpO2 96%   Physical Exam Vitals and nursing note reviewed.  Constitutional:      Appearance: He is well-developed.  HENT:     Head: Normocephalic and atraumatic.  Eyes:     Pupils: Pupils are equal, round, and reactive to  light.  Neck:     Vascular: No JVD.  Cardiovascular:     Rate and Rhythm: Normal rate and regular rhythm.     Heart sounds: No murmur heard.   No friction rub. No gallop.  Pulmonary:     Effort: No respiratory distress.     Breath sounds: No wheezing.  Chest:     Chest wall: No tenderness.     Comments: No obvious pain with range of motion of the left shoulder. Abdominal:     General: There is no distension.  Tenderness: There is no abdominal tenderness. There is no guarding or rebound.  Musculoskeletal:        General: Normal range of motion.     Cervical back: Normal range of motion and neck supple.  Skin:    Coloration: Skin is not pale.     Findings: No rash.  Neurological:     Mental Status: He is alert and oriented to person, place, and time.  Psychiatric:        Behavior: Behavior normal.    ED Results / Procedures / Treatments   Labs (all labs ordered are listed, but only abnormal results are displayed) Labs Reviewed  BASIC METABOLIC PANEL - Abnormal; Notable for the following components:      Result Value   Glucose, Bld 145 (*)    GFR, Estimated 60 (*)    All other components within normal limits  CBC WITH DIFFERENTIAL/PLATELET  TROPONIN I (HIGH SENSITIVITY)  TROPONIN I (HIGH SENSITIVITY)    EKG EKG Interpretation  Date/Time:  Thursday May 07 2021 18:29:12 EDT Ventricular Rate:  69 PR Interval:    QRS Duration: 108 QT Interval:  402 QTC Calculation: 430 R Axis:   -50 Text Interpretation: sinus rhythm with premature atrial complexes Left anterior fascicular block Left ventricular hypertrophy ( R in aVL , Cornell product , Romhilt-Estes ) Abnormal ECG No significant change since last tracing Confirmed by Deno Etienne [phone]) on 05/08/2021 2:24:09 AM  Radiology DG Chest 2 View  Result Date: 05/07/2021 CLINICAL DATA:  Chest pain. EXAM: CHEST - 2 VIEW COMPARISON:  December 28, 2015. FINDINGS: The heart size and mediastinal contours are within normal limits.  Both lungs are clear. The visualized skeletal structures are unremarkable. IMPRESSION: No active cardiopulmonary disease. Electronically Signed   By: Marijo Conception M.D.   On: 05/07/2021 18:56    Procedures Procedures   Medications Ordered in ED Medications - No data to display  ED Course  I have reviewed the triage vital signs and the nursing notes.  Pertinent labs & imaging results that were available during my care of the patient were reviewed by me and considered in my medical decision making (see chart for details).    MDM Rules/Calculators/A&P                           80 yo M with a chief complaints of left shoulder pain.  Sounds musculoskeletal by history worse with certain positions movement.  He denies trauma.  He called his family doctor who suggested he come to the ED to be evaluated.  2 troponins are negative EKG without any change from baseline no significant anemia no significant electrolyte abnormality chest x-ray viewed by me without focal infiltrate or pneumothorax.  We will treat with diclofenac gel.  PCP follow-up.  3:02 AM:  I have discussed the diagnosis/risks/treatment options with the patient and believe the pt to be eligible for discharge home to follow-up with PCP. We also discussed returning to the ED immediately if new or worsening sx occur. We discussed the sx which are most concerning (e.g., sudden worsening pain, fever, inability to tolerate by mouth) that necessitate immediate return. Medications administered to the patient during their visit and any new prescriptions provided to the patient are listed below.  Medications given during this visit Medications - No data to display   The patient appears reasonably screen and/or stabilized for discharge and I doubt any other medical condition or other [facility]  requiring further screening, evaluation, or treatment in the ED at this time prior to discharge.   Final Clinical Impression(s) / ED Diagnoses Final  diagnoses:  Nonspecific chest pain    Rx / DC Orders ED Discharge Orders          Ordered    diclofenac Sodium (VOLTAREN) 1 % GEL  4 times daily        05/08/21 0252             Deno Etienne, DO 05/08/21 0302

## 2021-05-08 NOTE — Telephone Encounter (Signed)
See note, patient had ER visit, Xray order done by ER provider

## 2021-05-08 NOTE — Discharge Instructions (Addendum)
Use the gel as prescribed.  Also take tylenol '1000mg'$ (2 extra strength) four times a day.   Return for symptoms that are persistent or happen when you are exercising.

## 2021-05-12 DIAGNOSIS — M5430 Sciatica, unspecified side: Secondary | ICD-10-CM | POA: Diagnosis not present

## 2021-05-12 DIAGNOSIS — M5440 Lumbago with sciatica, unspecified side: Secondary | ICD-10-CM | POA: Diagnosis not present

## 2021-05-12 DIAGNOSIS — M9903 Segmental and somatic dysfunction of lumbar region: Secondary | ICD-10-CM | POA: Diagnosis not present

## 2021-05-12 DIAGNOSIS — M9901 Segmental and somatic dysfunction of cervical region: Secondary | ICD-10-CM | POA: Diagnosis not present

## 2021-05-18 DIAGNOSIS — G4733 Obstructive sleep apnea (adult) (pediatric): Secondary | ICD-10-CM | POA: Diagnosis not present

## 2021-05-21 DIAGNOSIS — H524 Presbyopia: Secondary | ICD-10-CM | POA: Diagnosis not present

## 2021-05-21 DIAGNOSIS — E119 Type 2 diabetes mellitus without complications: Secondary | ICD-10-CM | POA: Diagnosis not present

## 2021-05-21 LAB — HM DIABETES EYE EXAM

## 2021-05-22 ENCOUNTER — Encounter: Payer: Self-pay | Admitting: Family Medicine

## 2021-06-08 ENCOUNTER — Other Ambulatory Visit: Payer: Self-pay | Admitting: Internal Medicine

## 2021-06-08 NOTE — Telephone Encounter (Signed)
Pt last saw Dr Gasper Sells 02/13/21, last labs 05/07/21 Creat 1.21, age 80, weight 109.8kg, based on specified criteria pt is on appropriate dosage of Eliquis '5mg'$  BId for afib.  Will refill rx.

## 2021-06-09 ENCOUNTER — Ambulatory Visit (INDEPENDENT_AMBULATORY_CARE_PROVIDER_SITE_OTHER): Payer: Medicare Other | Admitting: *Deleted

## 2021-06-09 ENCOUNTER — Telehealth: Payer: Medicare Other

## 2021-06-09 DIAGNOSIS — I10 Essential (primary) hypertension: Secondary | ICD-10-CM

## 2021-06-09 DIAGNOSIS — I5042 Chronic combined systolic (congestive) and diastolic (congestive) heart failure: Secondary | ICD-10-CM

## 2021-06-09 DIAGNOSIS — E1159 Type 2 diabetes mellitus with other circulatory complications: Secondary | ICD-10-CM

## 2021-06-09 DIAGNOSIS — M9901 Segmental and somatic dysfunction of cervical region: Secondary | ICD-10-CM | POA: Diagnosis not present

## 2021-06-09 DIAGNOSIS — M9903 Segmental and somatic dysfunction of lumbar region: Secondary | ICD-10-CM | POA: Diagnosis not present

## 2021-06-09 DIAGNOSIS — M5430 Sciatica, unspecified side: Secondary | ICD-10-CM | POA: Diagnosis not present

## 2021-06-09 DIAGNOSIS — M5440 Lumbago with sciatica, unspecified side: Secondary | ICD-10-CM | POA: Diagnosis not present

## 2021-06-09 NOTE — Patient Instructions (Signed)
Visit Information  PATIENT GOALS:  Goals Addressed             This Visit's Progress    (RNCM) Monitor and Manage My Blood Sugar-Diabetes Type 2   On track    Timeframe:  Long-Range Goal Priority:  Medium Start Date: 02/12/2021                            Expected End Date:  11/10/2021                 Follow Up Date 07/21/21    Check blood sugar at least 3 times a week Check blood sugar if I feel it is too high or too low Enter blood sugar readings and medication into daily log Take the blood sugar log to all doctor visits  Contact primary care provider for sustained elevations   Why is this important?   Checking your blood sugar at home helps to keep it from getting very high or very low.  Writing the results in a diary or log helps the doctor know how to care for you.  Your blood sugar log should have the time, date and the results.  Also, write down the amount of insulin or other medicine that you take.  Other information, like what you ate, exercise done and how you were feeling, will also be helpful.     Notes:      (RNCM) Track and Manage Fluids and Swelling-Heart Failure   On track    Timeframe:  Long-Range Goal Priority:  Medium Start Date:  12/04/20                           Expected End Date:  11/10/21                Follow Up Date 07/21/21    Call office if I gain more than 2 pounds in one day or 5 pounds in one week Weigh myself daily, at same time each day and record in log Try stick with low salt heart healthy carbohydrate modified diabetic diet Monitor yourself for rapid irregular heart rate, taking prn medication if indicated and notifying provider Take weight log with you to medical appointments    Why is this important?   It is important to check your weight daily and watch how much salt and liquids you have.  It will help you to manage your heart failure.    Notes:         Patient verbalizes understanding of instructions provided today and agrees  to view in [place].   The care management team will reach out to the patient again over the next 45 business days.   Hubert Azure RN, MSN RN Care Management Coordinator  [place] [phone] [email or web address]

## 2021-06-09 NOTE — Chronic Care Management (AMB) (Signed)
Chronic Care Management   CCM RN Visit Note  06/09/2021 Name: Brent Taylor MRN: [id number] DOB: 1941/05/28  Subjective: Brent Taylor is a 80 y.o. year old male who is a primary care patient of [name], MD. The care management team was consulted for assistance with disease management and care coordination needs.    Engaged with patient by telephone for follow up visit in response to provider referral for case management and/or care coordination services.   Consent to Services:  The patient was given information about Chronic Care Management services, agreed to services, and gave verbal consent prior to initiation of services.  Please see initial visit note for detailed documentation.   Patient agreed to services and verbal consent obtained.   Assessment: Review of patient past medical history, allergies, medications, health status, including review of consultants reports, laboratory and other test data, was performed as part of comprehensive evaluation and provision of chronic care management services.   SDOH (Social Determinants of Health) assessments and interventions performed:    CCM Care Plan  Allergies  Allergen Reactions   Lipitor [Atorvastatin Calcium]     Muscle ache    Outpatient Encounter Medications as of 06/09/2021  Medication Sig   apixaban (ELIQUIS) 5 MG TABS tablet TAKE ONE TABLET BY MOUTH TWICE A DAY   Blood Glucose Monitoring Suppl (ONE TOUCH ULTRA 2) w/Device KIT Use daily to check blood sugar.   diclofenac Sodium (VOLTAREN) 1 % GEL Apply 4 g topically 4 (four) times daily.   diltiazem (CARDIZEM) 30 MG tablet Take 1 tablet every 4 hours AS NEEDED for afib heart rate >100 as long as top blood pressure >100.   FLUoxetine (PROZAC) 40 MG capsule Take 1 capsule (40 mg total) by mouth daily.   gabapentin (NEURONTIN) 100 MG capsule Take 1 capsule (100 mg total) by mouth at bedtime.   glucose blood test strip Use to test blood sugars daily. Dx: E11.9   ibuprofen  (ADVIL,MOTRIN) 200 MG tablet Take 600 mg by mouth as needed for moderate pain.   Lancets (ONETOUCH ULTRASOFT) lancets Use to test blood sugars daily. Dx: E11.9   metoprolol tartrate (LOPRESSOR) 25 MG tablet Take 0.5 tablets (12.5 mg total) by mouth 2 (two) times daily.   pantoprazole (PROTONIX) 20 MG tablet Take 1 tablet (20 mg total) by mouth daily.   pravastatin (PRAVACHOL) 40 MG tablet Take 1 tablet (40 mg total) by mouth daily.   quinapril (ACCUPRIL) 20 MG tablet Take 1 tablet (20 mg total) by mouth at bedtime.   sildenafil (VIAGRA) 100 MG tablet Take 1 tablet (100 mg total) by mouth daily as needed for erectile dysfunction.   traMADol (ULTRAM) 50 MG tablet Take 1 tablet (50 mg total) by mouth every 12 (twelve) hours as needed for severe pain.   No facility-administered encounter medications on file as of 06/09/2021.    Patient Active Problem List   Diagnosis Date Noted   Thoracic aortic aneurysm without rupture ([place]) 02/13/2021   Mixed hyperlipidemia 02/13/2021   OSA (obstructive sleep apnea) 12/09/2020   Sensorineural hearing loss (SNHL) of left ear with restricted hearing of right ear 08/19/2020   Subjective tinnitus, bilateral 08/19/2020   Atrial fibrillation ([place]) 06/10/2020   Degeneration of lumbar intervertebral disc 03/31/2020   Low back pain 03/11/2020   Spinal stenosis of lumbar region 08/29/2019   Type 2 diabetes mellitus with vascular disease ([place]) 10/20/2016   Hypertension associated with diabetes ([place]) 11/20/2015   Essential hypertension    Coronary artery  disease 12/10/2011   Dyslipidemia associated with type 2 diabetes mellitus ([place]) 12/10/2011   Major depression in remission ([place]) 12/10/2011   GERD (gastroesophageal reflux disease) 12/10/2011   Hx of colonic polyps 12/10/2011   Melanoma ([place]) 12/10/2011    Conditions to be addressed/monitored:CHF and DMII  Care Plan : Heart Failure (Adult)  Updates made by Leona Singleton, RN since 06/09/2021 12:00 AM      Problem: Heart Failure Management   Priority: Medium     Long-Range Goal: Symptom Exacerbation Prevented or Minimized   Start Date: 12/04/2020  Expected End Date: 11/10/2021  This Visit's Progress: On track  Recent Progress: On track  Priority: Medium  Note:   Current Barriers:  Knowledge deficits related to basic heart failure pathophysiology and self care management as evidenced by patient not weighing himself daily.  Weight this morning was 243 pounds.  Does report an episode of chest pains, related to possible pulled muscle.  Denies any lower extremity edema or shortness of breath.  Continues to monitor blood pressures ranging (150-170//70's).  States he is now tolerating CPAP mask. Nurse Case Manager Clinical Goal(s):  Over the next 90 days, patient will weigh self daily and record Over the next 120 days, patient will verbalize understanding of Heart Failure Action Plan and when to call doctor Interventions:  Collaboration with [name], MD regarding development and update of comprehensive plan of care as evidenced by provider attestation and co-signature Inter-disciplinary care team collaboration (see longitudinal plan of care) Basic overview and discussion of pathophysiology of Heart Failure Provided verbal education on low sodium heart healthy carbohydrate modified diabetic diet Reviewed signs and symptoms of heart failure and discussed Heart Failure Action Plan  Rediscussed importance of daily weight monitoring and when to call provider based on weight Encouraged patient to view EMMI Heart Failure  and daily weight monitoring educational video  Encouraged to continue to monitor heart rate for increase rate and being out of rhythm  Reviewed medications and encouraged medication compliance Sent copy of heart failure action plan Encouraged continued blood pressure monitoring and notifying provider for sustained elevations Encouraged to keep and attend scheduled medical  appointments Congratulated on increase in activity, going to gym and recent weight loss No recommendations/changes Patient Goals/Self-Care Activities Over the next 90 days, patient will:  Call office if I gain more than 2 pounds in one day or 5 pounds in one week Weigh myself daily, at same time each day and record in log Try stick with low salt heart healthy carbohydrate modified diabetic diet Monitor yourself for rapid irregular heart rate, taking prn medication if indicated and notifying provider Take weight log with you to medical appointments Follow Up Plan: The care management team will reach out to the patient again over the next 45 business days.         Care Plan : Diabetes Type 2 (Adult)  Updates made by Leona Singleton, RN since 06/09/2021 12:00 AM     Problem: Glycemic Management (Diabetes, Type 2)   Priority: Medium     Long-Range Goal: Patient will report maintaining Hgb A1C of below 7 in the next 90 days   Start Date: 02/12/2021  Expected End Date: 11/10/2021  This Visit's Progress: On track  Priority: Medium  Note:   Objective:  Lab Results  Component Value Date   HGBA1C 6.5 12/09/2020  Current Barriers:  Knowledge Deficits related to basic Diabetes pathophysiology and self care/management; Reports monitoring blood sugars about every other day,  with fasting ranges of 110-120's.  Denies any episodes of hypo or hyperglycemia.  Latest Hgb A1C slightly increased to 6.5.  Obtained diabetic eye exam Case Manager Clinical Goal(s):  patient will demonstrate improved adherence to prescribed treatment plan for diabetes self care/management as evidenced by: every other day monitoring and recording of CBG; adherence to ADA/ carb modified diet; adherence to prescribed medication regimen; contacting provider for new or worsened symptoms or questions Interventions:  Collaboration with [name], MD regarding development and update of comprehensive plan of care as evidenced  by provider attestation and co-signature Inter-disciplinary care team collaboration (see longitudinal plan of care) Provided education to patient about basic DM disease process Reviewed medications with patient and discussed importance of medication adherence Discussed plans with patient for ongoing care management follow up and provided patient with direct contact information for care management team Provided patient with verbal education related to hypo and hyperglycemia and importance of correct treatment; and self-awareness of signs/symptoms of hypo or hyperglycemia encouraged Advised patient, providing education and rationale, to check cbg every other day and record, calling PCP for findings outside established parameters.   Barriers to adherence to treatment plan identified Blood glucose monitoring encouraged; blood glucose readings reviewed; - use of blood glucose monitoring log promoted Mutual A1C goal set or reviewed; Discussed ways to help maintain current A1C Congratulated patient on current A1C (although slightly increased, still within goal) Encouraged to attend all scheduled provider appointments Congratulated on obtaining eye exam Discussed and encouraged to adhere to prescribed ADA/carb modified Encouraged to continue to increase activity as tolerated, going to gym No immediate recommendations/changes Patient Goals/Self-Care Activities: Check blood sugar at least 3 times a week Check blood sugar if I feel it is too high or too low Enter blood sugar readings and medication into daily log Take the blood sugar log to all doctor visits  Contact primary care provider for sustained elevations Follow Up Plan: The care management team will reach out to the patient again over the next 45 business days.       Plan:The care management team will reach out to the patient again over the next 45 business days.  Hubert Azure RN, MSN RN Care Management Coordinator  [place] [phone] [email or web address]

## 2021-06-11 ENCOUNTER — Telehealth: Payer: Medicare Other

## 2021-06-17 ENCOUNTER — Telehealth: Payer: Self-pay | Admitting: Pharmacist

## 2021-06-17 NOTE — Chronic Care Management (AMB) (Addendum)
Chronic Care Management Pharmacy Assistant   Name: Brent Taylor  MRN: [id number] DOB: 1940/10/27  Reason for Encounter: Hypertension Adherence Call    Recent office visits:  None  Recent consult visits:  None  Hospital visits:  ED Visit 05/07/2021 Deno Etienne, DO OTC Voltaren gel for rib pain  Medications: Outpatient Encounter Medications as of 06/17/2021  Medication Sig   apixaban (ELIQUIS) 5 MG TABS tablet TAKE ONE TABLET BY MOUTH TWICE A DAY   Blood Glucose Monitoring Suppl (ONE TOUCH ULTRA 2) w/Device KIT Use daily to check blood sugar.   diclofenac Sodium (VOLTAREN) 1 % GEL Apply 4 g topically 4 (four) times daily.   diltiazem (CARDIZEM) 30 MG tablet Take 1 tablet every 4 hours AS NEEDED for afib heart rate >100 as long as top blood pressure >100.   FLUoxetine (PROZAC) 40 MG capsule Take 1 capsule (40 mg total) by mouth daily.   gabapentin (NEURONTIN) 100 MG capsule Take 1 capsule (100 mg total) by mouth at bedtime.   glucose blood test strip Use to test blood sugars daily. Dx: E11.9   ibuprofen (ADVIL,MOTRIN) 200 MG tablet Take 600 mg by mouth as needed for moderate pain.   Lancets (ONETOUCH ULTRASOFT) lancets Use to test blood sugars daily. Dx: E11.9   metoprolol tartrate (LOPRESSOR) 25 MG tablet Take 0.5 tablets (12.5 mg total) by mouth 2 (two) times daily.   pantoprazole (PROTONIX) 20 MG tablet Take 1 tablet (20 mg total) by mouth daily.   pravastatin (PRAVACHOL) 40 MG tablet Take 1 tablet (40 mg total) by mouth daily.   quinapril (ACCUPRIL) 20 MG tablet Take 1 tablet (20 mg total) by mouth at bedtime.   sildenafil (VIAGRA) 100 MG tablet Take 1 tablet (100 mg total) by mouth daily as needed for erectile dysfunction.   traMADol (ULTRAM) 50 MG tablet Take 1 tablet (50 mg total) by mouth every 12 (twelve) hours as needed for severe pain.   No facility-administered encounter medications on file as of 06/17/2021.   Reviewed chart prior to disease state call. Spoke with patient  regarding BP  Recent Office Vitals: BP Readings from Last 3 Encounters:  05/08/21 (!) 174/95  03/18/21 140/86  02/18/21 138/73   Pulse Readings from Last 3 Encounters:  05/08/21 64  03/18/21 (!) 55  02/18/21 68    Wt Readings from Last 3 Encounters:  03/18/21 242 lb (109.8 kg)  02/18/21 243 lb (110.2 kg)  02/13/21 242 lb (109.8 kg)     Kidney Function Lab Results  Component Value Date/Time   CREATININE 1.21 05/07/2021 06:34 PM   CREATININE 1.08 12/09/2020 08:35 AM   GFR 64.10 12/09/2020 08:35 AM   GFRNONAA 60 (L) 05/07/2021 06:34 PM   GFRAA >60 04/25/2016 01:45 PM    BMP Latest Ref Rng & Units 05/07/2021 12/09/2020 02/19/2020  Glucose 70 - 99 mg/dL 145(H) 128(H) 105(H)  BUN 8 - 23 mg/dL $Remove'21 20 23  'TwVBNkU$ Creatinine 0.61 - 1.24 mg/dL 1.21 1.08 1.20  Sodium 135 - 145 mmol/L 139 140 137  Potassium 3.5 - 5.1 mmol/L 4.0 4.2 4.5  Chloride 98 - 111 mmol/L 107 105 104  CO2 22 - 32 mmol/L $RemoveB'25 27 25  'spFkvUuU$ Calcium 8.9 - 10.3 mg/dL 9.2 8.9 9.3    Current antihypertensive regimen:  Quinapril 20 mg tablet  How often are you checking your Blood Pressure? 1-2x per week  Current home BP readings: None to report, patient states he did not have his log near by but they  are all within normal limits.  What recent interventions/DTPs have been made by any provider to improve Blood Pressure control since last CPP Visit: None  What diet changes have been made to improve Blood Pressure Control?  Patient states he watches his diet well and eats healthy.  What exercise is being done to improve your Blood Pressure Control?  Patient states he exercises 3 days a week.  Adherence Review: Is the patient currently on ACE/ARB medication? Yes Does the patient have >5 day gap between last estimated fill dates? No   Care Gaps: Ophthalmology Exam: Next due om 05/21/2022 Medicare Annual Wellness: Completed Zoster Vaccines- Shingrix: Overdue - never done COVID-19 Vaccination: (4 - Booster for [employer] series)  Overdue since 11/22/2020 Foot Exam: Postponed until 08/11/2021 PNA Vaccination: Completed Influenza Vaccination: Overdue since 05/11/2021 Hemoglobin A1C: Due since 06/11/2021 Colonoscopy:  Tetanus/DTAP: Next due on 03/13/2024 HPV Vaccines: Aged Out  Future Appointments  Date Time Provider [place]  07/21/2021 10:00 AM [facility]PC-CCM CARE MGR [facility] P[facility]  08/17/2021  1:00 PM [facility] HEALTH COACH [facility]  09/17/2021 10:00 AM Sueanne Margarita, MD [facility]  09/21/2021  2:30 PM [facility] CCM PHARMACIST [facility] PEC  10/16/2021  9:35 AM [facility]-CV CH ECHO 1 [facility]SITE3ECHO [facility]    Star Rating Drugs: Pravastatin 40 mg last filled 04/05/2021 90 DS Quinapril 20 mg last filled 06/26/022 90 DS  April D Calhoun, [place] Pharmacist Assistant [phone]   6 minutes spent in review, coordination, and documentation.  Reviewed by: Beverly Milch, PharmD Clinical Pharmacist [phone]

## 2021-06-18 DIAGNOSIS — G4733 Obstructive sleep apnea (adult) (pediatric): Secondary | ICD-10-CM | POA: Diagnosis not present

## 2021-06-25 ENCOUNTER — Ambulatory Visit (INDEPENDENT_AMBULATORY_CARE_PROVIDER_SITE_OTHER): Payer: Medicare Other

## 2021-06-25 ENCOUNTER — Encounter: Payer: Self-pay | Admitting: Family Medicine

## 2021-06-25 DIAGNOSIS — Z23 Encounter for immunization: Secondary | ICD-10-CM | POA: Diagnosis not present

## 2021-07-07 ENCOUNTER — Encounter: Payer: Self-pay | Admitting: Family Medicine

## 2021-07-07 DIAGNOSIS — M5440 Lumbago with sciatica, unspecified side: Secondary | ICD-10-CM | POA: Diagnosis not present

## 2021-07-07 DIAGNOSIS — M5430 Sciatica, unspecified side: Secondary | ICD-10-CM | POA: Diagnosis not present

## 2021-07-07 DIAGNOSIS — M9901 Segmental and somatic dysfunction of cervical region: Secondary | ICD-10-CM | POA: Diagnosis not present

## 2021-07-07 DIAGNOSIS — M9903 Segmental and somatic dysfunction of lumbar region: Secondary | ICD-10-CM | POA: Diagnosis not present

## 2021-07-13 ENCOUNTER — Telehealth: Payer: Self-pay | Admitting: Pharmacist

## 2021-07-13 NOTE — Chronic Care Management (AMB) (Signed)
Chronic Care Management Pharmacy Assistant   Name: Brent Taylor  MRN: [id number] DOB: [birth year]   Reason for Encounter: Diabetes Adherence Call   Recent office visits:  None  Recent consult visits:  None  Hospital visits:  None in previous 6 months  Medications: Outpatient Encounter Medications as of 07/13/2021  Medication Sig   apixaban (ELIQUIS) 5 MG TABS tablet TAKE ONE TABLET BY MOUTH TWICE A DAY   Blood Glucose Monitoring Suppl (ONE TOUCH ULTRA 2) w/Device KIT Use daily to check blood sugar.   diclofenac Sodium (VOLTAREN) 1 % GEL Apply 4 g topically 4 (four) times daily.   diltiazem (CARDIZEM) 30 MG tablet Take 1 tablet every 4 hours AS NEEDED for afib heart rate >100 as long as top blood pressure >100.   FLUoxetine (PROZAC) 40 MG capsule Take 1 capsule (40 mg total) by mouth daily.   gabapentin (NEURONTIN) 100 MG capsule Take 1 capsule (100 mg total) by mouth at bedtime.   glucose blood test strip Use to test blood sugars daily. Dx: E11.9   ibuprofen (ADVIL,MOTRIN) 200 MG tablet Take 600 mg by mouth as needed for moderate pain.   Lancets (ONETOUCH ULTRASOFT) lancets Use to test blood sugars daily. Dx: E11.9   metoprolol tartrate (LOPRESSOR) 25 MG tablet Take 0.5 tablets (12.5 mg total) by mouth 2 (two) times daily.   pantoprazole (PROTONIX) 20 MG tablet Take 1 tablet (20 mg total) by mouth daily.   pravastatin (PRAVACHOL) 40 MG tablet Take 1 tablet (40 mg total) by mouth daily.   quinapril (ACCUPRIL) 20 MG tablet Take 1 tablet (20 mg total) by mouth at bedtime.   sildenafil (VIAGRA) 100 MG tablet Take 1 tablet (100 mg total) by mouth daily as needed for erectile dysfunction.   traMADol (ULTRAM) 50 MG tablet Take 1 tablet (50 mg total) by mouth every 12 (twelve) hours as needed for severe pain.   No facility-administered encounter medications on file as of 07/13/2021.   Recent Relevant Labs: Lab Results  Component Value Date/Time   HGBA1C 6.5 12/09/2020 08:35 AM    HGBA1C 6.2 (A) 10/13/2020 08:31 AM   HGBA1C 6.4 11/05/2019 08:53 AM   MICROALBUR 1.9 10/25/2017 09:25 AM   MICROALBUR 2.6 (H) 10/20/2016 09:27 AM    Kidney Function Lab Results  Component Value Date/Time   CREATININE 1.21 05/07/2021 06:34 PM   CREATININE 1.08 12/09/2020 08:35 AM   GFR 64.10 12/09/2020 08:35 AM   GFRNONAA 60 (L) 05/07/2021 06:34 PM   GFRAA >60 04/25/2016 01:45 PM    Current antihyperglycemic regimen:  None  What recent interventions/DTPs have been made to improve glycemic control:  No recent intervention or DTPs.  Have there been any recent hospitalizations or ED visits since last visit with CPP? No  Patient denies hypoglycemic symptoms.  Patient denies hyperglycemic symptoms.  How often are you checking your blood sugar? once daily  What are your blood sugars ranging?  Fasting: 130's, 140s Before meals: n/a After meals: n/a Bedtime: n/a  During the week, how often does your blood glucose drop below 70? Never  Are you checking your feet daily/regularly? Patient states he does check his feet regularly.  Adherence Review: Is the patient currently on a STATIN medication? Yes Is the patient currently on ACE/ARB medication? Yes Does the patient have >5 day gap between last estimated fill dates? No  Patient states he has pains in lower legs and feet. He states they go numb and hurt at night. Is there anything  he can take to make it any better? - I sent a message to team [name]. He is also due for a follow up with Dr. [name], I requested for someone to reach out to him to schedule said appointment.  Care Gaps: Ophthalmology Exam: Next due on 05/21/2022 Medicare Annual Wellness: Completed Zoster Vaccines- Shingrix: Overdue - never done COVID-19 Vaccination: (4 - Booster for [employer] series) Overdue since 11/22/2020 Foot Exam: Postponed until 08/11/2021 PNA Vaccination: Completed Influenza Vaccination: Overdue since 05/11/2021 Hemoglobin A1C: Due since  06/11/2021 Colonoscopy: Completed 10/11/2009 Tetanus/DTAP: Next due on 03/13/2024  Future Appointments  Date Time Provider [place]  07/21/2021 10:00 AM [facility] HPC-CCM CARE MGR [facility] PEC  08/17/2021  1:00 PM [facility] HEALTH COACH [facility] P[facility]  09/17/2021 10:00 AM Sueanne Margarita, MD [facility]  09/21/2021  2:30 PM [facility] CCM PHARMACIST [facility] PEC  10/16/2021  9:35 AM MC-CV CH ECHO 1 [facility]SITE3ECHO [facility]  11/10/2021  9:00 AM Werner Lean, MD [facility]    Star Rating Drugs: Pravastatin 40 mg last filled 07/08/2021 90 DS Quinapril 20 mg last filled 07/08/2021 90 DS  April D Calhoun, [place] Pharmacist Assistant [phone]

## 2021-07-16 DIAGNOSIS — G4733 Obstructive sleep apnea (adult) (pediatric): Secondary | ICD-10-CM | POA: Diagnosis not present

## 2021-07-18 DIAGNOSIS — G4733 Obstructive sleep apnea (adult) (pediatric): Secondary | ICD-10-CM | POA: Diagnosis not present

## 2021-07-21 ENCOUNTER — Telehealth: Payer: Medicare Other

## 2021-08-04 ENCOUNTER — Telehealth: Payer: Medicare Other

## 2021-08-04 ENCOUNTER — Telehealth: Payer: Self-pay | Admitting: *Deleted

## 2021-08-04 DIAGNOSIS — M9903 Segmental and somatic dysfunction of lumbar region: Secondary | ICD-10-CM | POA: Diagnosis not present

## 2021-08-04 DIAGNOSIS — M5440 Lumbago with sciatica, unspecified side: Secondary | ICD-10-CM | POA: Diagnosis not present

## 2021-08-04 DIAGNOSIS — M9901 Segmental and somatic dysfunction of cervical region: Secondary | ICD-10-CM | POA: Diagnosis not present

## 2021-08-04 DIAGNOSIS — M5430 Sciatica, unspecified side: Secondary | ICD-10-CM | POA: Diagnosis not present

## 2021-08-04 NOTE — Telephone Encounter (Signed)
  Care Management   Follow Up Note   08/04/2021 Name: Brent Taylor MRN: [id number] DOB: April 22, 1941   Referred by: [name], MD Reason for referral : Chronic Care Management (HTN, DM)   An unsuccessful telephone outreach was attempted today. The patient was referred to the case management team for assistance with care management and care coordination.   Follow Up Plan: RNCM will seek assistance from Care Guides in rescheduling appointment within the next 30 days.  Hubert Azure RN, MSN RN Care Management Coordinator  [place] [phone] [email or web address]

## 2021-08-14 NOTE — Telephone Encounter (Signed)
Pt has been rescheduled. 

## 2021-08-18 DIAGNOSIS — G4733 Obstructive sleep apnea (adult) (pediatric): Secondary | ICD-10-CM | POA: Diagnosis not present

## 2021-09-01 DIAGNOSIS — M9903 Segmental and somatic dysfunction of lumbar region: Secondary | ICD-10-CM | POA: Diagnosis not present

## 2021-09-01 DIAGNOSIS — M5430 Sciatica, unspecified side: Secondary | ICD-10-CM | POA: Diagnosis not present

## 2021-09-01 DIAGNOSIS — M5440 Lumbago with sciatica, unspecified side: Secondary | ICD-10-CM | POA: Diagnosis not present

## 2021-09-01 DIAGNOSIS — M9901 Segmental and somatic dysfunction of cervical region: Secondary | ICD-10-CM | POA: Diagnosis not present

## 2021-09-14 ENCOUNTER — Encounter: Payer: Self-pay | Admitting: Cardiology

## 2021-09-15 ENCOUNTER — Encounter: Payer: Self-pay | Admitting: Family Medicine

## 2021-09-15 NOTE — Progress Notes (Deleted)
Chronic Care Management Pharmacy Note  09/21/2021 Name:  Brent Taylor MRN:  [id number] DOB:  December 06, 1940  Recommendations/Changes made from today's visit: No Changes  Subjective: Brent Taylor is an 80 y.o. year old male who is a primary patient of [name], MD.  The CCM team was consulted for assistance with disease management and care coordination needs.    Engaged with patient by telephone for follow up visit in response to provider referral for pharmacy case management and/or care coordination services.   Consent to Services:  The patient was given information about Chronic Care Management services, agreed to services, and gave verbal consent prior to initiation of services.  Please see initial visit note for detailed documentation.   Patient Care Team: [name], MD as PCP - General (Family Medicine) Werner Lean, MD as PCP - Cardiology (Cardiology) Sueanne Margarita, MD as PCP - Sleep Medicine (Cardiology) Madelin Rear, [facility] (Pharmacist) Madelin Rear, [facility] as Pharmacist (Pharmacist) Leona Singleton, RN as Case Manager  Recent office visits:  None   Recent consult visits:  None   Hospital visits:  05/07/21 (ED) - chest pain, patient was released home no evidence found  Objective:  Lab Results  Component Value Date   CREATININE 1.21 05/07/2021   CREATININE 1.08 12/09/2020   CREATININE 1.20 02/19/2020    Lab Results  Component Value Date   HGBA1C 6.5 12/09/2020   Last diabetic Eye exam:  Lab Results  Component Value Date/Time   HMDIABEYEEXA No Retinopathy 05/21/2021 12:00 AM    Last diabetic Foot exam:  Lab Results  Component Value Date/Time   HMDIABFOOTEX NORMAL 12/10/2011 12:00 AM        Component Value Date/Time   CHOL 144 12/09/2020 0835   TRIG 157.0 (H) 12/09/2020 0835   HDL 31.50 (L) 12/09/2020 0835   CHOLHDL 5 12/09/2020 0835   VLDL 31.4 12/09/2020 0835   LDLCALC 81 12/09/2020 0835   LDLDIRECT 142.7 12/10/2011 1436     Hepatic Function Latest Ref Rng & Units 12/09/2020 02/19/2020 11/05/2019  Total Protein 6.0 - 8.3 g/dL 6.5 6.9 6.3  Albumin 3.5 - 5.2 g/dL 4.1 4.3 4.1  AST 0 - 37 U/L $Remo'24 29 29  'jMVaX$ ALT 0 - 53 U/L 22 34 29  Alk Phosphatase 39 - 117 U/L 55 61 52  Total Bilirubin 0.2 - 1.2 mg/dL 0.6 0.7 0.7    Lab Results  Component Value Date/Time   TSH 2.07 12/09/2020 08:35 AM   TSH 2.42 02/19/2020 01:29 PM   FREET4 1.06 11/09/2015 10:15 PM    CBC Latest Ref Rng & Units 05/07/2021 12/09/2020 02/19/2020  WBC 4.0 - 10.5 K/uL 7.3 5.7 8.1  Hemoglobin 13.0 - 17.0 g/dL 15.3 15.9 16.6  Hematocrit 39.0 - 52.0 % 49.0 48.5 49.7  Platelets 150 - 400 K/uL 189 176.0 196.0    No results found for: VD25OH  Clinical ASCVD:  The ASCVD Risk score (Arnett DK, et al., 2019) failed to calculate for the following reasons:   The 2019 ASCVD risk score is only valid for ages 39 to 67    Social History   Tobacco Use  Smoking Status Never  Smokeless Tobacco Never   BP Readings from Last 3 Encounters:  05/08/21 (!) 174/95  03/18/21 140/86  02/18/21 138/73   Pulse Readings from Last 3 Encounters:  05/08/21 64  03/18/21 (!) 55  02/18/21 68   Wt Readings from Last 3 Encounters:  03/18/21 242 lb (109.8 kg)  02/18/21 243 lb (110.2 kg)  02/13/21 242 lb (109.8 kg)    Assessment: Review of patient past medical history, allergies, medications, health status, including review of consultants reports, laboratory and other test data, was performed as part of comprehensive evaluation and provision of chronic care management services.   SDOH:  (Social Determinants of Health) assessments and interventions performed: Yes   CCM Care Plan  Allergies  Allergen Reactions   Lipitor [Atorvastatin Calcium]     Muscle ache    Medications Reviewed Today     Reviewed by Leona Singleton, RN (Registered Nurse) on 06/09/21 at 1330  Med List Status: <None>   Medication Order Taking? Sig Documenting Provider Last Dose Status  Informant  apixaban (ELIQUIS) 5 MG TABS tablet [id number]  TAKE ONE TABLET BY MOUTH TWICE A DAY [name], [name], MD  Active   Blood Glucose Monitoring Suppl (ONE TOUCH ULTRA 2) w/Device KIT [id number] No Use daily to check blood sugar. [name], MD Taking Active   diclofenac Sodium (VOLTAREN) 1 % GEL [id number]  Apply 4 g topically 4 (four) times daily. Deno Etienne, DO  Active   diltiazem (CARDIZEM) 30 MG tablet [id number] No Take 1 tablet every 4 hours AS NEEDED for afib heart rate >100 as long as top blood pressure >100. Sherran Needs, NP Taking Active   FLUoxetine (PROZAC) 40 MG capsule [id number]  Take 1 capsule (40 mg total) by mouth daily. [name], MD  Active   gabapentin (NEURONTIN) 100 MG capsule [id number] No Take 1 capsule (100 mg total) by mouth at bedtime. [name], MD Taking Active   glucose blood test strip [id number] No Use to test blood sugars daily. Dx: E11.9 [name], MD Taking Active   ibuprofen (ADVIL,MOTRIN) 200 MG tablet [id number] No Take 600 mg by mouth as needed for moderate pain. [provider] Taking Active   Lancets [facility] ULTRASOFT) lancets [id number] No Use to test blood sugars daily. Dx: E11.9 [name], MD Taking Active   metoprolol tartrate (LOPRESSOR) 25 MG tablet [id number] No Take 0.5 tablets (12.5 mg total) by mouth 2 (two) times daily. Thompson Grayer, MD Taking Active   pantoprazole (PROTONIX) 20 MG tablet [id number]  Take 1 tablet (20 mg total) by mouth daily. [name], MD  Active   pravastatin (PRAVACHOL) 40 MG tablet [id number]  Take 1 tablet (40 mg total) by mouth daily. [name], MD  Active   quinapril (ACCUPRIL) 20 MG tablet [id number]  Take 1 tablet (20 mg total) by mouth at bedtime. [name], MD  Active   sildenafil (VIAGRA) 100 MG tablet [id number]  Take 1 tablet (100 mg total) by mouth daily as needed for erectile dysfunction. [name], MD  Active   traMADol Veatrice Bourbon) 50 MG tablet  [id number]  Take 1 tablet (50 mg total) by mouth every 12 (twelve) hours as needed for severe pain. [name], MD  Active             Patient Active Problem List   Diagnosis Date Noted   Thoracic aortic aneurysm without rupture 02/13/2021   Mixed hyperlipidemia 02/13/2021   OSA (obstructive sleep apnea) 12/09/2020   Sensorineural hearing loss (SNHL) of left ear with restricted hearing of right ear 08/19/2020   Subjective tinnitus, bilateral 08/19/2020   Atrial fibrillation ([place]) 06/10/2020   Degeneration of lumbar intervertebral disc 03/31/2020   Low back pain 03/11/2020   Spinal stenosis of lumbar region 08/29/2019  Type 2 diabetes mellitus with vascular disease ([place]) 10/20/2016   Hypertension associated with diabetes ([place]) 11/20/2015   Essential hypertension    Coronary artery disease 12/10/2011   Dyslipidemia associated with type 2 diabetes mellitus ([place]) 12/10/2011   Major depression in remission ([place]) 12/10/2011   GERD (gastroesophageal reflux disease) 12/10/2011   Hx of colonic polyps 12/10/2011   Melanoma ([place]) 12/10/2011    Immunization History  Administered Date(s) Administered   Fluad Quad(high Dose 65+) 06/25/2021   Influenza Split 07/14/2012   Influenza, High Dose Seasonal PF 07/28/2015, 09/14/2016, 10/07/2017, 07/11/2018, 05/31/2019   Influenza,inj,Quad PF,6+ Mos 07/16/2013, 07/25/2014   Influenza,inj,quad, With Preservative 07/16/2019   PFIZER(Purple Top)SARS-COV-2 Vaccination 11/19/2019, 12/14/2019, 08/22/2020   Pneumococcal Conjugate-13 03/13/2014   Pneumococcal Polysaccharide-23 07/16/2013   Tetanus 03/13/2014    Conditions to be addressed/monitored: Hypertension, Hyperlipidemia, Diabetes, Atrial Fibrillation, Heart Failure, GERD and Depression  Medication Assistance: None required.  Patient affirms current coverage meets needs.  Patient's preferred pharmacy is:  [id number] - [place], [place] [address] Phone: [phone] Fax: [phone]  Follow Up:  Patient agrees to Care Plan and Follow-up.  Plan:  [facility] f/u call 6 months, CPA BP/exercise call  3 months  Future Appointments  Date Time Provider [place]  09/21/2021  2:30 PM [facility] CCM PHARMACIST [facility] PEC  09/24/2021  9:00 AM [facility] HPC-CCM CARE MGR [facility] P[facility]  10/16/2021  9:35 AM MC-CV CH ECHO 1 [facility]  10/26/2021 10:20 AM Sueanne Margarita, MD [facility]  11/10/2021  9:00 AM Werner Lean, MD [facility]   Beverly Milch, PharmD Clinical Pharmacist  [facility] [phone]     Current Barriers:  Focus on improving physical activity  Pharmacist Clinical Goal(s):  Over the next 365 days, patient will verbalize ability to afford treatment regimen achieve adherence to monitoring guidelines and medication adherence to achieve therapeutic efficacy contact provider office for questions/concerns as evidenced notation of same in electronic health record through collaboration with PharmD and provider.   Interventions: 1:1 collaboration with [name], MD regarding development and update of comprehensive plan of care as evidenced by provider attestation and co-signature Inter-disciplinary care team collaboration (see longitudinal plan of care) Comprehensive medication review performed; medication list updated in electronic medical record  Hypertension (BP goal <140/90)/CHF -Controlled  -Using CPAP for OSA - getting more used to this now, no major difference in energy levels -Echo 10/15/20: EF up to 50-55% from 40-45% in 2017. Mod LVH. -Current treatment: Diltiazem 30 mg once daily as needed Laroy Apple) Metoprolol tartrate 12.5 mg twice daily (Dr Rayann Heman) Quinapril 20 mg once daily at bedtime (PCP) -Medications previously tried: quinapril 40 mg->20 mg (current), amlodipine 5 mg->10 mg  -Current home readings:  still around 140/90 -Denies hypotensive/hypertensive symptoms -Educated on BP goals  -Counseled to monitor BP at home, document, and provide log at future appointments Recommended to continue current medication  Hyperlipidemia: (LDL goal < 100) -Controlled -HLD HTN DM HF, CAD -Current treatment: Pravastatin 40 mg once daily  -Medications previously tried: pitavastatin 4 mg, pravastatin 20 mg-> current pravastatin 40 mg  -Current dietary patterns: see DM -Current exercise habits: see DM -Reviewed Cholesterol goals;  -Reviewed side effects - no issues Recommend continue current management  Diabetes (A1c goal <7%) -Controlled -Current medications: n/a -Medications previously tried: metformin 500 mg IR once daily with breakfast (2013-2020) -Current home glucose readings fasting glucose: 110s post prandial glucose: n/a -  Denies hypoglycemic/hyperglycemic symptoms -Current meal patterns: focusing more on protein over carb intake. Minimal snacking. Mentions current 'reset diet' -Current exercise: yard work, goal to get back to [facility] as patient is feeling more comfortable getting out - had not been able to get back passed few months. Encouraged routine exercise. -Counseled on diet and exercise extensively

## 2021-09-16 NOTE — Telephone Encounter (Signed)
Please advise 

## 2021-09-17 ENCOUNTER — Ambulatory Visit: Payer: Medicare Other | Admitting: Cardiology

## 2021-09-17 ENCOUNTER — Encounter: Payer: Self-pay | Admitting: Family Medicine

## 2021-09-17 NOTE — Telephone Encounter (Signed)
Left message to return call to our office at their convenience.  

## 2021-09-17 NOTE — Telephone Encounter (Signed)
Left message to return call to our office at their convenience.  Need verification on mediation dose

## 2021-09-18 ENCOUNTER — Telehealth: Payer: Self-pay

## 2021-09-18 ENCOUNTER — Other Ambulatory Visit: Payer: Self-pay | Admitting: Family Medicine

## 2021-09-18 MED ORDER — TRAMADOL HCL 50 MG PO TABS: 100.0000 mg | ORAL_TABLET | Freq: Two times a day (BID) | ORAL | 5 refills | Status: DC | PRN

## 2021-09-18 MED ORDER — TRAMADOL HCL 100 MG PO TABS: 100.0000 mg | ORAL_TABLET | Freq: Two times a day (BID) | ORAL | 5 refills | Status: DC | PRN

## 2021-09-18 NOTE — Telephone Encounter (Signed)
See note

## 2021-09-18 NOTE — Telephone Encounter (Signed)
Brent Taylor called to get clarification on the dosage for Tramadol. She stated that the medication does not come in 100mg  unless it is for extended release. She would like a call back. Please Advise.

## 2021-09-18 NOTE — Telephone Encounter (Signed)
Patient comment: Prefer 100 mg if possible.

## 2021-09-21 ENCOUNTER — Telehealth: Payer: Medicare Other

## 2021-09-21 ENCOUNTER — Ambulatory Visit: Payer: Medicare Other | Admitting: Family Medicine

## 2021-09-24 ENCOUNTER — Telehealth: Payer: Self-pay | Admitting: *Deleted

## 2021-09-24 ENCOUNTER — Telehealth: Payer: Medicare Other

## 2021-09-24 NOTE — Telephone Encounter (Signed)
°  Care Management   Follow Up Note   09/24/2021 Name: Brent Taylor MRN: [id number] DOB: 1941-06-02   Referred by: [name], MD Reason for referral : Chronic Care Management (DM, HTN)   Noticed patient no longer on my schedule to outreach this morning.  Upon chart review,patient showed at office this morning and stated he could not do telephone outreach.     RNCM attempted outreach to reschedule appointment, no answer, voicemail left.  Follow Up Plan: RNCM will seek assistance from Care Guides in rescheduling appointment within the next 30 days.  Hubert Azure RN, MSN RN Care Management Coordinator  [facility] [phone] [email or web address]

## 2021-09-30 ENCOUNTER — Ambulatory Visit (HOSPITAL_COMMUNITY)
Admission: RE | Admit: 2021-09-30 | Discharge: 2021-09-30 | Disposition: A | Discharge status: Home / Self Care | Payer: Medicare Other | Source: Ambulatory Visit | Attending: Nurse Practitioner | Admitting: Nurse Practitioner

## 2021-09-30 ENCOUNTER — Other Ambulatory Visit: Payer: Self-pay

## 2021-09-30 ENCOUNTER — Other Ambulatory Visit (HOSPITAL_COMMUNITY): Payer: Self-pay | Admitting: Nurse Practitioner

## 2021-09-30 ENCOUNTER — Encounter (HOSPITAL_COMMUNITY): Payer: Self-pay | Admitting: Nurse Practitioner

## 2021-09-30 DIAGNOSIS — I4891 Unspecified atrial fibrillation: Secondary | ICD-10-CM | POA: Insufficient documentation

## 2021-09-30 DIAGNOSIS — Z79899 Other long term (current) drug therapy: Secondary | ICD-10-CM | POA: Diagnosis not present

## 2021-09-30 DIAGNOSIS — I491 Atrial premature depolarization: Secondary | ICD-10-CM | POA: Diagnosis not present

## 2021-09-30 DIAGNOSIS — Z7901 Long term (current) use of anticoagulants: Secondary | ICD-10-CM | POA: Insufficient documentation

## 2021-09-30 DIAGNOSIS — I499 Cardiac arrhythmia, unspecified: Secondary | ICD-10-CM

## 2021-09-30 NOTE — Progress Notes (Signed)
Pt called the office stating that he has noted more afib going in and out. He is here to have a monitor placed. Ekg auto read out says afib  but there are definitely p waves with frequent PAC's. This is what was seen on a previous monitor. Pt is concerned  as apple watch reads afib. Will place a 2 week zio patch. He will be called when report is received. He is on eliquis 5 mg bid as well as metoprolol 12.5 mg bid.

## 2021-09-30 NOTE — Telephone Encounter (Signed)
Rx was sent on 12/9.

## 2021-10-01 ENCOUNTER — Encounter: Payer: Self-pay | Admitting: Family Medicine

## 2021-10-01 NOTE — Telephone Encounter (Signed)
Pt has been r/s  

## 2021-10-02 ENCOUNTER — Encounter: Payer: Self-pay | Admitting: Family Medicine

## 2021-10-02 ENCOUNTER — Telehealth (INDEPENDENT_AMBULATORY_CARE_PROVIDER_SITE_OTHER): Payer: Medicare Other | Admitting: Family Medicine

## 2021-10-02 VITALS — Ht 72.0 in | Wt 250.0 lb

## 2021-10-02 DIAGNOSIS — U071 COVID-19: Secondary | ICD-10-CM

## 2021-10-02 MED ORDER — MOLNUPIRAVIR EUA 200MG CAPSULE: 4.0000 | ORAL_CAPSULE | Freq: Two times a day (BID) | ORAL | 0 refills | Status: AC

## 2021-10-02 MED ORDER — BENZONATATE 100 MG PO CAPS: 100.0000 mg | ORAL_CAPSULE | Freq: Three times a day (TID) | ORAL | 0 refills | Status: DC | PRN

## 2021-10-02 NOTE — Progress Notes (Signed)
MyChart Video Visit    Virtual Visit via Video Note   This visit type was conducted due to national recommendations for restrictions regarding the COVID-19 Pandemic (e.g. social distancing) in an effort to limit this patient's exposure and mitigate transmission in our community. This patient is at least at moderate risk for complications without adequate follow up. This format is felt to be most appropriate for this patient at this time. Physical exam was limited by quality of the video and audio technology used for the visit. CMA was able to get the patient set up on a video visit.  Patient location: Home. Patient and provider in visit Provider location: Office  I discussed the limitations of evaluation and management by telemedicine and the availability of in person appointments. The patient expressed understanding and agreed to proceed.  Visit Date: 10/02/2021  Today's healthcare provider: Angelena Sole., MD     Subjective:    Patient ID: Brent Taylor, male    DOB: 03-21-1941, 80 y.o.   MRN: [id number]  Chief Complaint  Patient presents with   Fever    Sx started Thursday morning Tested positive for Covid   Cough    Non productive cough   Generalized Body Aches   Fatigue    HPI: changed into tele visit as couldn't get on video Started getting sick late Wednesday.  Tested yesterday am and + for covid. Fevers up to 100.5. cough, aches, fatigue. No SOB.  No v/d.    Past Medical History:  Diagnosis Date   Cancer (HCC)    hx - melanoma   Chicken pox    Colon polyps    Depression    No need for therapy.  Improved   Diabetes (HCC)    Diverticula, colon    Hemorrhoids    Hyperlipidemia    Hypertension    Sepsis (HCC)    Tinnitus    UTI (lower urinary tract infection)     Past Surgical History:  Procedure Laterality Date   MELANOMA EXCISION     LEFT CHEST   MELANOMA EXCISION      Outpatient Medications Prior to Visit  Medication Sig Dispense Refill    apixaban (ELIQUIS) 5 MG TABS tablet TAKE ONE TABLET BY MOUTH TWICE A DAY 60 tablet 5   Blood Glucose Monitoring Suppl (ONE TOUCH ULTRA 2) w/Device KIT Use daily to check blood sugar. 1 kit 0   diltiazem (CARDIZEM) 30 MG tablet Take 1 tablet every 4 hours AS NEEDED for afib heart rate >100 as long as top blood pressure >100. 45 tablet 1   FLUoxetine (PROZAC) 40 MG capsule Take 1 capsule (40 mg total) by mouth daily. 90 capsule 3   gabapentin (NEURONTIN) 100 MG capsule Take 1 capsule (100 mg total) by mouth at bedtime. 90 capsule 3   glucose blood test strip Use to test blood sugars daily. Dx: E11.9 100 strip 3   ibuprofen (ADVIL,MOTRIN) 200 MG tablet Take 600 mg by mouth as needed for moderate pain.     Lancets (ONETOUCH ULTRASOFT) lancets Use to test blood sugars daily. Dx: E11.9 100 each 12   metoprolol tartrate (LOPRESSOR) 25 MG tablet Take 0.5 tablets (12.5 mg total) by mouth 2 (two) times daily. 180 tablet 3   pantoprazole (PROTONIX) 20 MG tablet Take 1 tablet (20 mg total) by mouth daily. 90 tablet 3   pravastatin (PRAVACHOL) 40 MG tablet Take 1 tablet (40 mg total) by mouth daily. 90 tablet 1   quinapril (ACCUPRIL)  20 MG tablet Take 1 tablet (20 mg total) by mouth at bedtime. 90 tablet 1   sildenafil (VIAGRA) 100 MG tablet Take 1 tablet (100 mg total) by mouth daily as needed for erectile dysfunction. 10 tablet 0   traMADol (ULTRAM) 50 MG tablet Take 2 tablets (100 mg total) by mouth every 12 (twelve) hours as needed. 60 tablet 5   No facility-administered medications prior to visit.    Allergies  Allergen Reactions   Lipitor [Atorvastatin Calcium]     Muscle ache        Objective:     Physical Exam Vitals and nursing note reviewed.  Constitutional:      General: She is not in acute distress.    Appearance: Normal appearance.  HENT:     Head: Normocephalic.  Pulmonary:     Effort: No respiratory distress.  Musculoskeletal:     Cervical back: Normal range of motion.   Skin:    General: Skin is dry.     Coloration: Skin is not pale.  Neurological:     Mental Status: She is alert and oriented to person, place, and time.  Psychiatric:        Mood and Affect: Mood normal.   Ht 6' (1.829 m)    Wt 250 lb (113.4 kg)    BMI 33.91 kg/m   Wt Readings from Last 3 Encounters:  10/02/21 250 lb (113.4 kg)  09/30/21 250 lb (113.4 kg)  03/18/21 242 lb (109.8 kg)       Assessment & Plan:   Problem List Items Addressed This Visit   None Visit Diagnoses     COVID-19    -  Primary   Relevant Medications   molnupiravir EUA (LAGEVRIO) 200 mg CAPS capsule     Covid-discussed meds.  Pt high risk.  Will do molnupiravir.  Tessalon perles if needed.  ER if sob, worse, etc.  Meds ordered this encounter  Medications   molnupiravir EUA (LAGEVRIO) 200 mg CAPS capsule    Sig: Take 4 capsules (800 mg total) by mouth 2 (two) times daily for 5 days.    Dispense:  40 capsule    Refill:  0   benzonatate (TESSALON PERLES) 100 MG capsule    Sig: Take 1 capsule (100 mg total) by mouth 3 (three) times daily as needed for cough.    Dispense:  20 capsule    Refill:  0    I discussed the assessment and treatment plan with the patient. The patient was provided an opportunity to ask questions and all were answered. The patient agreed with the plan and demonstrated an understanding of the instructions.   The patient was advised to call back or seek an in-person evaluation if the symptoms worsen or if the condition fails to improve as anticipated.  I provided 15 minutes of face-to-face time during this encounter.   [name] Hampshire., MD [place] [phone] (phone) [phone] (fax)  [place]

## 2021-10-02 NOTE — Patient Instructions (Signed)
Advised of CDC guidelines for self isolation/ ending isolation.  Advised of safe practice guidelines. Symptom Tier reviewed.  Encouraged to monitor for any worsening symptoms; watch for increased shortness of breath, weakness, and signs of dehydration. Advised when to seek emergency care.  Instructed to rest and hydrate well.  Advised to leave the house during recommended isolation period, only if it is necessary to seek medical care  

## 2021-10-06 ENCOUNTER — Other Ambulatory Visit: Payer: Self-pay | Admitting: Internal Medicine

## 2021-10-08 ENCOUNTER — Encounter: Payer: Medicare Other | Admitting: Family Medicine

## 2021-10-16 ENCOUNTER — Ambulatory Visit (HOSPITAL_COMMUNITY): Payer: Medicare HMO | Attending: Cardiology

## 2021-10-16 ENCOUNTER — Other Ambulatory Visit: Payer: Self-pay

## 2021-10-16 DIAGNOSIS — I340 Nonrheumatic mitral (valve) insufficiency: Secondary | ICD-10-CM

## 2021-10-16 DIAGNOSIS — I712 Thoracic aortic aneurysm, without rupture, unspecified: Secondary | ICD-10-CM | POA: Diagnosis not present

## 2021-10-16 LAB — ECHOCARDIOGRAM COMPLETE
Area-P 1/2: 4.23 cm2
MV M vel: 4.13 m/s
MV Peak grad: 68.1 mmHg
S' Lateral: 3.2 cm

## 2021-10-19 ENCOUNTER — Encounter: Payer: Self-pay | Admitting: Internal Medicine

## 2021-10-19 DIAGNOSIS — I10 Essential (primary) hypertension: Secondary | ICD-10-CM

## 2021-10-19 DIAGNOSIS — I712 Thoracic aortic aneurysm, without rupture, unspecified: Secondary | ICD-10-CM

## 2021-10-19 NOTE — Telephone Encounter (Signed)
Order placed for CT aorta and BMP.  Message sent to pcc to schedule.

## 2021-10-19 NOTE — Telephone Encounter (Signed)
-----   Message from Werner Lean, MD sent at 10/18/2021  2:03 PM EST ----- Results: AA is dilated from prior Plan: CT Aorta ideally before our next visit  Werner Lean, MD

## 2021-10-20 ENCOUNTER — Other Ambulatory Visit: Payer: Self-pay

## 2021-10-20 ENCOUNTER — Ambulatory Visit (INDEPENDENT_AMBULATORY_CARE_PROVIDER_SITE_OTHER): Payer: Medicare HMO | Admitting: *Deleted

## 2021-10-20 ENCOUNTER — Other Ambulatory Visit: Payer: Medicare HMO | Admitting: *Deleted

## 2021-10-20 DIAGNOSIS — E1159 Type 2 diabetes mellitus with other circulatory complications: Secondary | ICD-10-CM

## 2021-10-20 DIAGNOSIS — G4733 Obstructive sleep apnea (adult) (pediatric): Secondary | ICD-10-CM | POA: Diagnosis not present

## 2021-10-20 DIAGNOSIS — I152 Hypertension secondary to endocrine disorders: Secondary | ICD-10-CM

## 2021-10-20 DIAGNOSIS — I10 Essential (primary) hypertension: Secondary | ICD-10-CM

## 2021-10-20 DIAGNOSIS — I712 Thoracic aortic aneurysm, without rupture, unspecified: Secondary | ICD-10-CM

## 2021-10-20 NOTE — Patient Instructions (Addendum)
Visit Information  Thank you for taking time to visit with me today. Please don't hesitate to contact me if I can be of assistance to you before our next scheduled telephone appointment.  Following are the goals we discussed today:   Check blood sugar at least 3 times a week  Check blood sugar if I feel it is too high or too low  Enter blood sugar readings and medication into daily log  Take the blood sugar log to all doctor visits   Contact primary care provider for sustained elevations  Call office if I gain more than 2 pounds in one day or 5 pounds in one week  Weigh myself daily, at same time each day and record in log  Try stick with low salt heart healthy carbohydrate modified diabetic diet  Monitor yourself for rapid irregular heart rate, taking prn medication if indicated and notifying provider  Take weight log with you to medical appointments   Our next appointment is by telephone on 2/23 at 1030  Please call the care guide team at [phone] if you need to cancel or reschedule your appointment.   If you are experiencing a Mental Health or [place] or need someone to talk to, please call the Suicide and Crisis Lifeline: 988 call the [place] National Suicide Prevention Lifeline: 480-843-2674 or TTY: (580)501-0126 TTY (670)874-2207) to talk to a trained counselor call 1-800-273-TALK (toll free, 24 hour hotline) go to [facility] [address] [place] [phone]) call 911   Patient verbalizes understanding of instructions provided today and agrees to view in [place].   Hubert Azure RN, MSN RN Care Management Coordinator  [facility] [phone] [email or web address]

## 2021-10-20 NOTE — Chronic Care Management (AMB) (Addendum)
Chronic Care Management   CCM RN Visit Note  10/20/2021 Name: Brent Taylor MRN: [id number] DOB: Feb 16, 1941  Subjective: Brent Taylor is a 81 y.o. year old male who is a primary care patient of [name], MD. The care management team was consulted for assistance with disease management and care coordination needs.    Engaged with patient by telephone for follow up visit in response to provider referral for case management and/or care coordination services.   Consent to Services:  The patient was given information about Chronic Care Management services, agreed to services, and gave verbal consent prior to initiation of services.  Please see initial visit note for detailed documentation.   Patient agreed to services and verbal consent obtained.   Assessment: Review of patient past medical history, allergies, medications, health status, including review of consultants reports, laboratory and other test data, was performed as part of comprehensive evaluation and provision of chronic care management services.   SDOH (Social Determinants of Health) assessments and interventions performed:    CCM Care Plan  Allergies  Allergen Reactions   Lipitor [Atorvastatin Calcium]     Muscle ache    Outpatient Encounter Medications as of 10/20/2021  Medication Sig   apixaban (ELIQUIS) 5 MG TABS tablet TAKE ONE TABLET BY MOUTH TWICE A DAY   benzonatate (TESSALON PERLES) 100 MG capsule Take 1 capsule (100 mg total) by mouth 3 (three) times daily as needed for cough.   Blood Glucose Monitoring Suppl (ONE TOUCH ULTRA 2) w/Device KIT Use daily to check blood sugar.   diltiazem (CARDIZEM) 30 MG tablet Take 1 tablet every 4 hours AS NEEDED for afib heart rate >100 as long as top blood pressure >100.   FLUoxetine (PROZAC) 40 MG capsule Take 1 capsule (40 mg total) by mouth daily.   gabapentin (NEURONTIN) 100 MG capsule Take 1 capsule (100 mg total) by mouth at bedtime.   glucose blood test strip Use to  test blood sugars daily. Dx: E11.9   ibuprofen (ADVIL,MOTRIN) 200 MG tablet Take 600 mg by mouth as needed for moderate pain.   Lancets (ONETOUCH ULTRASOFT) lancets Use to test blood sugars daily. Dx: E11.9   metoprolol tartrate (LOPRESSOR) 25 MG tablet TAKE 1/2 TABLET BY MOUTH TWO TIMES A DAY   pantoprazole (PROTONIX) 20 MG tablet Take 1 tablet (20 mg total) by mouth daily.   pravastatin (PRAVACHOL) 40 MG tablet Take 1 tablet (40 mg total) by mouth daily.   quinapril (ACCUPRIL) 20 MG tablet Take 1 tablet (20 mg total) by mouth at bedtime.   sildenafil (VIAGRA) 100 MG tablet Take 1 tablet (100 mg total) by mouth daily as needed for erectile dysfunction.   traMADol (ULTRAM) 50 MG tablet Take 2 tablets (100 mg total) by mouth every 12 (twelve) hours as needed.   No facility-administered encounter medications on file as of 10/20/2021.    Patient Active Problem List   Diagnosis Date Noted   Thoracic aortic aneurysm without rupture 02/13/2021   Mixed hyperlipidemia 02/13/2021   OSA (obstructive sleep apnea) 12/09/2020   Sensorineural hearing loss (SNHL) of left ear with restricted hearing of right ear 08/19/2020   Subjective tinnitus, bilateral 08/19/2020   Atrial fibrillation ([place]) 06/10/2020   Degeneration of lumbar intervertebral disc 03/31/2020   Low back pain 03/11/2020   Spinal stenosis of lumbar region 08/29/2019   Type 2 diabetes mellitus with vascular disease ([place]) 10/20/2016   Hypertension associated with diabetes ([place]) 11/20/2015   Essential hypertension    Coronary  artery disease 12/10/2011   Dyslipidemia associated with type 2 diabetes mellitus ([place]) 12/10/2011   Major depression in remission ([place]) 12/10/2011   GERD (gastroesophageal reflux disease) 12/10/2011   Hx of colonic polyps 12/10/2011   Melanoma ([place]) 12/10/2011    Conditions to be addressed/monitored:CHF and DMII  Care Plan : Heart Failure (Adult)  Updates made by Leona Singleton, RN since 10/20/2021 12:00 AM   Completed 10/20/2021   Problem: Heart Failure Management Resolved 10/20/2021  Priority: Medium  Note:   RESOLVING DUE TO DUPLICATE GOALS    Long-Range Goal: Symptom Exacerbation Prevented or Minimized Completed 10/20/2021  Start Date: 12/04/2020  Expected End Date: 11/10/2021  Recent Progress: On track  Priority: Medium  Note:   RESOLVING DUE TO DUPLICATE GOALS   Current Barriers:  Knowledge deficits related to basic heart failure pathophysiology and self care management as evidenced by patient not weighing himself daily.  Weight this morning was 243 pounds.  Does report an episode of chest pains, related to possible pulled muscle.  Denies any lower extremity edema or shortness of breath.  Continues to monitor blood pressures ranging (150-170//70's).  States he is now tolerating CPAP mask. Nurse Case Manager Clinical Goal(s):  Over the next 90 days, patient will weigh self daily and record Over the next 120 days, patient will verbalize understanding of Heart Failure Action Plan and when to call doctor Interventions:  Collaboration with [name], MD regarding development and update of comprehensive plan of care as evidenced by provider attestation and co-signature Inter-disciplinary care team collaboration (see longitudinal plan of care) Basic overview and discussion of pathophysiology of Heart Failure Provided verbal education on low sodium heart healthy carbohydrate modified diabetic diet Reviewed signs and symptoms of heart failure and discussed Heart Failure Action Plan  Rediscussed importance of daily weight monitoring and when to call provider based on weight Encouraged patient to view EMMI Heart Failure  and daily weight monitoring educational video  Encouraged to continue to monitor heart rate for increase rate and being out of rhythm  Reviewed medications and encouraged medication compliance Sent copy of heart failure action plan Encouraged continued blood pressure  monitoring and notifying provider for sustained elevations Encouraged to keep and attend scheduled medical appointments Congratulated on increase in activity, going to gym and recent weight loss No recommendations/changes Patient Goals/Self-Care Activities Over the next 90 days, patient will:  Call office if I gain more than 2 pounds in one day or 5 pounds in one week Weigh myself daily, at same time each day and record in log Try stick with low salt heart healthy carbohydrate modified diabetic diet Monitor yourself for rapid irregular heart rate, taking prn medication if indicated and notifying provider Take weight log with you to medical appointments Follow Up Plan: The care management team will reach out to the patient again over the next 45 business days.         Care Plan : Diabetes Type 2 (Adult)  Updates made by Leona Singleton, RN since 10/20/2021 12:00 AM     Problem: Glycemic Management (Diabetes, Type 2) & Heart Failure   Priority: Medium     Long-Range Goal: Patient will report maintaining Hgb A1C of below 7 in the next 90 days and work with CCM team to better self care manage chronic medical conditions   Start Date: 10/20/2021  Expected End Date: 11/10/2022  Recent Progress: On track  Priority: Medium  Note:   Current Barriers:  Knowledge Deficits related to  basic Diabetes pathophysiology and self care/management; Reports monitoring blood sugars about every other day, with fasting ranges of 110-120's, 115 this morning.  Denies any episodes of hypo or hyperglycemia.  Latest Hgb A1C slightly increased to 6.5.  Obtained diabetic eye exam in September Knowledge deficits related to basic heart failure pathophysiology and self care management as evidenced by patient not weighing himself daily.  Weight this morning was 250 pounds.  Denies any chest pain,  lower extremity edema or shortness of breath.  Continues to monitor blood pressures ranging (150/60-70's).  Patient stating  just recovering from COVID infection few days ago. Case Manager Clinical Goal(s):  patient will demonstrate improved adherence to prescribed treatment plan for diabetes self care/management as evidenced by: every other day monitoring and recording of CBG; adherence to ADA/ carb modified diet; adherence to prescribed medication regimen; contacting provider for new or worsened symptoms or questions Over the next 90 days, patient will weigh self daily and record Over the next 120 days, patient will verbalize understanding of Heart Failure Action Plan and when to call doctor  Diabetes:  (Status: Goal on Track (progressing): YES.) Long Term Goal   Lab Results  Component Value Date   HGBA1C 6.5 12/09/2020   @ Interventions:  Assessed patient's understanding of A1c goal: <6.5% Provided education to patient about basic DM disease process; Reviewed medications with patient and discussed importance of medication adherence;        Counseled on importance of regular laboratory monitoring as prescribed;        Discussed plans with patient for ongoing care management follow up and provided patient with direct contact information for care management team;      Provided patient with written educational materials related to hypo and hyperglycemia and importance of correct treatment;       Advised patient, providing education and rationale, to check cbg when you have symptoms of low or high blood sugar and 3 times a week  and record         Collaboration with [name], MD regarding development and update of comprehensive plan of care as evidenced by provider attestation and co-signature Inter-disciplinary care team collaboration (see longitudinal plan of care) Provided education to patient about basic DM disease process Reviewed medications with patient and discussed importance of medication adherence Discussed plans with patient for ongoing care management follow up and provided patient with direct  contact information for care management team Provided patient with verbal education related to hypo and hyperglycemia and importance of correct treatment; and self-awareness of signs/symptoms of hypo or hyperglycemia encouraged Advised patient, providing education and rationale, to check cbg every other day and record, calling PCP for findings outside established parameters.   Barriers to adherence to treatment plan identified Blood glucose monitoring encouraged; blood glucose readings reviewed; - use of blood glucose monitoring log promoted Mutual A1C goal set or reviewed; Discussed ways to help maintain current A1C Congratulated patient on current A1C (although slightly increased, still within goal) Encouraged to attend all scheduled provider appointments Congratulated on obtaining eye exam Discussed and encouraged to adhere to prescribed ADA/carb modified Encouraged to continue to increase activity as tolerated, going to gym No immediate recommendations/changes  Heart Failure Interventions:  (Status: Goal on Track (progressing): YES. Condition stable. Not addressed this visit.)  Long Term Goal  Wt Readings from Last 3 Encounters:  10/02/21 250 lb (113.4 kg)  09/30/21 250 lb (113.4 kg)  03/18/21 242 lb (109.8 kg)  Basic overview and discussion of  pathophysiology of Heart Failure reviewed Provided education on low sodium diet Reviewed Heart Failure Action Plan in depth and provided written copy Discussed importance of daily weight and advised patient to weigh and record daily Discussed the importance of keeping all appointments with provider  Provided verbal education on low sodium heart healthy carbohydrate modified diabetic diet Reviewed signs and symptoms of heart failure and discussed Heart Failure Action Plan  Rediscussed importance of daily weight monitoring and when to call provider based on weight Encouraged patient to view EMMI Heart Failure  and daily weight monitoring  educational video  Encouraged to continue to monitor heart rate for increase rate and being out of rhythm  Encouraged continued blood pressure monitoring and notifying provider for sustained elevations Encouraged to keep and attend scheduled medical appointments Congratulated on increase in activity, going to gym and recent weight loss Patient Goals/Self-Care Activities: Check blood sugar at least 3 times a week Check blood sugar if I feel it is too high or too low Enter blood sugar readings and medication into daily log Take the blood sugar log to all doctor visits  Contact primary care provider for sustained elevations Call office if I gain more than 2 pounds in one day or 5 pounds in one week Weigh myself daily, at same time each day and record in log Try stick with low salt heart healthy carbohydrate modified diabetic diet Monitor yourself for rapid irregular heart rate, taking prn medication if indicated and notifying provider Take weight log with you to medical appointments Follow Up Plan: The care management team will reach out to the patient again over the next 45 business days.      Plan:The care management team will reach out to the patient again over the next 45 days.  Hubert Azure RN, MSN RN Care Management Coordinator  [place] [phone] [email or web address]

## 2021-10-21 ENCOUNTER — Ambulatory Visit (INDEPENDENT_AMBULATORY_CARE_PROVIDER_SITE_OTHER): Payer: Medicare HMO | Admitting: Family Medicine

## 2021-10-21 ENCOUNTER — Encounter: Payer: Self-pay | Admitting: Family Medicine

## 2021-10-21 VITALS — BP 136/82 | HR 57 | Temp 97.1°F | Ht 72.0 in | Wt 241.6 lb

## 2021-10-21 DIAGNOSIS — Z0001 Encounter for general adult medical examination with abnormal findings: Secondary | ICD-10-CM

## 2021-10-21 DIAGNOSIS — C439 Malignant melanoma of skin, unspecified: Secondary | ICD-10-CM

## 2021-10-21 DIAGNOSIS — E1169 Type 2 diabetes mellitus with other specified complication: Secondary | ICD-10-CM | POA: Diagnosis not present

## 2021-10-21 DIAGNOSIS — E1159 Type 2 diabetes mellitus with other circulatory complications: Secondary | ICD-10-CM | POA: Diagnosis not present

## 2021-10-21 DIAGNOSIS — R351 Nocturia: Secondary | ICD-10-CM

## 2021-10-21 DIAGNOSIS — M48061 Spinal stenosis, lumbar region without neurogenic claudication: Secondary | ICD-10-CM

## 2021-10-21 DIAGNOSIS — I480 Paroxysmal atrial fibrillation: Secondary | ICD-10-CM

## 2021-10-21 DIAGNOSIS — G4733 Obstructive sleep apnea (adult) (pediatric): Secondary | ICD-10-CM

## 2021-10-21 DIAGNOSIS — I77819 Aortic ectasia, unspecified site: Secondary | ICD-10-CM | POA: Diagnosis not present

## 2021-10-21 DIAGNOSIS — I499 Cardiac arrhythmia, unspecified: Secondary | ICD-10-CM | POA: Diagnosis not present

## 2021-10-21 DIAGNOSIS — F325 Major depressive disorder, single episode, in full remission: Secondary | ICD-10-CM | POA: Diagnosis not present

## 2021-10-21 DIAGNOSIS — N529 Male erectile dysfunction, unspecified: Secondary | ICD-10-CM

## 2021-10-21 DIAGNOSIS — I152 Hypertension secondary to endocrine disorders: Secondary | ICD-10-CM

## 2021-10-21 DIAGNOSIS — E785 Hyperlipidemia, unspecified: Secondary | ICD-10-CM | POA: Diagnosis not present

## 2021-10-21 DIAGNOSIS — K219 Gastro-esophageal reflux disease without esophagitis: Secondary | ICD-10-CM | POA: Diagnosis not present

## 2021-10-21 DIAGNOSIS — I2510 Atherosclerotic heart disease of native coronary artery without angina pectoris: Secondary | ICD-10-CM

## 2021-10-21 DIAGNOSIS — R69 Illness, unspecified: Secondary | ICD-10-CM | POA: Diagnosis not present

## 2021-10-21 LAB — COMPREHENSIVE METABOLIC PANEL
ALT: 27 U/L (ref 0–53)
AST: 31 U/L (ref 0–37)
Albumin: 4.2 g/dL (ref 3.5–5.2)
Alkaline Phosphatase: 47 U/L (ref 39–117)
BUN: 21 mg/dL (ref 6–23)
CO2: 27 mEq/L (ref 19–32)
Calcium: 9.2 mg/dL (ref 8.4–10.5)
Chloride: 105 mEq/L (ref 96–112)
Creatinine, Ser: 1.11 mg/dL (ref 0.40–1.50)
GFR: 61.65 mL/min (ref >60.00)
Glucose, Bld: 125 mg/dL — ABNORMAL HIGH (ref 70–99)
Potassium: 4.5 mEq/L (ref 3.5–5.1)
Sodium: 140 mEq/L (ref 135–145)
Total Bilirubin: 0.7 mg/dL (ref 0.2–1.2)
Total Protein: 6.5 g/dL (ref 6.0–8.3)

## 2021-10-21 LAB — URINALYSIS, ROUTINE W REFLEX MICROSCOPIC
Bilirubin Urine: NEGATIVE
Hgb urine dipstick: NEGATIVE
Ketones, ur: NEGATIVE
Nitrite: NEGATIVE
Specific Gravity, Urine: 1.02 (ref 1.000–1.030)
Total Protein, Urine: NEGATIVE
Urine Glucose: NEGATIVE
Urobilinogen, UA: 0.2 (ref 0.0–1.0)
pH: 6 (ref 5.0–8.0)

## 2021-10-21 LAB — LIPID PANEL
Cholesterol: 146 mg/dL (ref 0–200)
HDL: 35.5 mg/dL — ABNORMAL LOW (ref >39.00)
LDL Cholesterol: 89 mg/dL (ref 0–99)
NonHDL: 110.46
Total CHOL/HDL Ratio: 4
Triglycerides: 108 mg/dL (ref 0.0–149.0)
VLDL: 21.6 mg/dL (ref 0.0–40.0)

## 2021-10-21 LAB — BASIC METABOLIC PANEL
BUN/Creatinine Ratio: 17 (ref 10–24)
BUN: 20 mg/dL (ref 8–27)
CO2: 22 mmol/L (ref 20–29)
Calcium: 9.3 mg/dL (ref 8.6–10.2)
Chloride: 104 mmol/L (ref 96–106)
Creatinine, Ser: 1.19 mg/dL (ref 0.76–1.27)
Glucose: 110 mg/dL — ABNORMAL HIGH (ref 70–99)
Potassium: 4.4 mmol/L (ref 3.5–5.2)
Sodium: 141 mmol/L (ref 134–144)
eGFR: 61 mL/min/{1.73_m2} (ref >59)

## 2021-10-21 LAB — CBC
HCT: 46 % (ref 39.0–52.0)
Hemoglobin: 14.5 g/dL (ref 13.0–17.0)
MCHC: 31.5 g/dL (ref 30.0–36.0)
MCV: 87.3 fl (ref 78.0–100.0)
Platelets: 236 10*3/uL (ref 150.0–400.0)
RBC: 5.27 Mil/uL (ref 4.22–5.81)
RDW: 16 % — ABNORMAL HIGH (ref 11.5–15.5)
WBC: 4.5 10*3/uL (ref 4.0–10.5)

## 2021-10-21 LAB — TESTOSTERONE: Testosterone: 321.17 ng/dL (ref 300.00–890.00)

## 2021-10-21 LAB — TSH: TSH: 1.9 u[IU]/mL (ref 0.35–5.50)

## 2021-10-21 LAB — POCT GLYCOSYLATED HEMOGLOBIN (HGB A1C): Hemoglobin A1C: 6.4 % — AB (ref 4.0–5.6)

## 2021-10-21 LAB — PSA: PSA: 0.72 ng/mL (ref 0.10–4.00)

## 2021-10-21 MED ORDER — TADALAFIL 10 MG PO TABS: 10.0000 mg | ORAL_TABLET | Freq: Every day | ORAL | 3 refills | Status: DC

## 2021-10-21 NOTE — Assessment & Plan Note (Signed)
Started CPAP a couple of months ago.

## 2021-10-21 NOTE — Assessment & Plan Note (Signed)
Follows with cardiology.  Has upcoming CT aortogram.

## 2021-10-21 NOTE — Assessment & Plan Note (Signed)
Check labs.  He is on pravastatin 40 mg daily. 

## 2021-10-21 NOTE — Assessment & Plan Note (Signed)
Regular rate and rhythm today.  Anticoagulated on Eliquis 5 mg twice daily and rate controlled on diltiazem 30 mg as needed.

## 2021-10-21 NOTE — Patient Instructions (Signed)
It was very nice to see you today!  We will check blood work and a urine sample today.  You are probably having prostate issues.  Please try the Cialis.  Let me know in a few weeks how this is working for you.  Your blood sugar and blood pressure look great today we will not make any other medication changes.  Please discuss with your pharmacy for your insurance company about where you should get the shingles vaccine.  We will see you back in 6 months.  Please come back to see me sooner if needed.  Take care, Dr [name]  PLEASE NOTE:  If you had any lab tests please let us know if you have not heard back within a few days. You may see your results on mychart before we have a chance to review them but we will give you a call once they are reviewed by [place]. If we ordered any referrals today, please let us know if you have not heard from their office within the next week.   Please try these tips to maintain a healthy lifestyle:  Eat at least 3 REAL meals and 1-2 snacks per day.  Aim for no more than 5 hours between eating.  If you eat breakfast, please do so within one hour of getting up.   Each meal should contain half fruits/vegetables, one quarter protein, and one quarter carbs (no bigger than a computer mouse)  Cut down on sweet beverages. This includes juice, soda, and sweet tea.   Drink at least 1 glass of water with each meal and aim for at least 8 glasses per day  Exercise at least 150 minutes every week.    Preventive Care 54 Years and Older, Male Preventive care refers to lifestyle choices and visits with your health care provider that can promote health and wellness. Preventive care visits are also called wellness exams. What can I expect for my preventive care visit? Counseling During your preventive care visit, your health care provider may ask about your: Medical history, including: Past medical problems. Family medical history. History of falls. Current health,  including: Emotional well-being. Home life and relationship well-being. Sexual activity. Memory and ability to understand (cognition). Lifestyle, including: Alcohol, nicotine or tobacco, and drug use. Access to firearms. Diet, exercise, and sleep habits. Work and work Statistician. Sunscreen use. Safety issues such as seatbelt and bike helmet use. Physical exam Your health care provider will check your: Height and weight. These may be used to calculate your BMI (body mass index). BMI is a measurement that tells if you are at a healthy weight. Waist circumference. This measures the distance around your waistline. This measurement also tells if you are at a healthy weight and may help predict your risk of certain diseases, such as type 2 diabetes and high blood pressure. Heart rate and blood pressure. Body temperature. Skin for abnormal spots. What immunizations do I need? Vaccines are usually given at various ages, according to a schedule. Your health care provider will recommend vaccines for you based on your age, medical history, and lifestyle or other factors, such as travel or where you work. What tests do I need? Screening Your health care provider may recommend screening tests for certain conditions. This may include: Lipid and cholesterol levels. Diabetes screening. This is done by checking your blood sugar (glucose) after you have not eaten for a while (fasting). Hepatitis C test. Hepatitis B test. HIV (human immunodeficiency virus) test. STI (sexually transmitted infection) testing,  if you are at risk. Lung cancer screening. Colorectal cancer screening. Prostate cancer screening. Abdominal aortic aneurysm (AAA) screening. You may need this if you are a current or former smoker. Talk with your health care provider about your test results, treatment options, and if necessary, the need for more tests. Follow these instructions at home: Eating and drinking  Eat a diet that  includes fresh fruits and vegetables, whole grains, lean protein, and low-fat dairy products. Limit your intake of foods with high amounts of sugar, saturated fats, and salt. Take vitamin and mineral supplements as recommended by your health care provider. Do not drink alcohol if your health care provider tells you not to drink. If you drink alcohol: Limit how much you have to 0-2 drinks a day. Know how much alcohol is in your drink. In the [place] one drink equals one 12 oz bottle of beer (355 mL), one 5 oz glass of wine (148 mL), or one 1 oz glass of hard liquor (44 mL). Lifestyle Brush your teeth every morning and night with fluoride toothpaste. Floss one time each day. Exercise for at least 30 minutes 5 or more days each week. Do not use any products that contain nicotine or tobacco. These products include cigarettes, chewing tobacco, and vaping devices, such as e-cigarettes. If you need help quitting, ask your health care provider. Do not use drugs. If you are sexually active, practice safe sex. Use a condom or other form of protection to prevent STIs. Take aspirin only as told by your health care provider. Make sure that you understand how much to take and what form to take. Work with your health care provider to find out whether it is safe and beneficial for you to take aspirin daily. Ask your health care provider if you need to take a cholesterol-lowering medicine (statin). Find healthy ways to manage stress, such as: Meditation, yoga, or listening to music. Journaling. Talking to a trusted person. Spending time with friends and family. Safety Always wear your seat belt while driving or riding in a vehicle. Do not drive: If you have been drinking alcohol. Do not ride with someone who has been drinking. When you are tired or distracted. While texting. If you have been using any mind-altering substances or drugs. Wear a helmet and other protective equipment during sports  activities. If you have firearms in your house, make sure you follow all gun safety procedures. Minimize exposure to UV radiation to reduce your risk of skin cancer. What's next? Visit your health care provider once a year for an annual wellness visit. Ask your health care provider how often you should have your eyes and teeth checked. Stay up to date on all vaccines. This information is not intended to replace advice given to you by your health care provider. Make sure you discuss any questions you have with your health care provider. Document Revised: 03/25/2021 Document Reviewed: 03/25/2021 Elsevier Patient Education  [place].

## 2021-10-21 NOTE — Assessment & Plan Note (Signed)
Follows with cardiology.  He is on Eliquis and statin.

## 2021-10-21 NOTE — Assessment & Plan Note (Signed)
At goal on metoprolol tartrate 12.5 mg twice daily, quinapril 20 mg daily.

## 2021-10-21 NOTE — Assessment & Plan Note (Signed)
Doing well with gabapentin 100 mg nightly.

## 2021-10-21 NOTE — Progress Notes (Signed)
Chief Complaint:  Brent Taylor is a 81 y.o. male who presents today for his annual comprehensive physical exam.    Assessment/Plan:  New/Acute Problems: Nocturia / Frequent Urination Likely BPH.  We will check UA and urine culture to rule out infection or other causes.  We will be switching to Cialis daily which should help with any BPH.  We will check PSA today as well.  He will check with me in a few weeks via MyChart. discussed reasons to return to care.  Chronic Problems Addressed Today: Hypertension associated with diabetes ([place]) At goal on metoprolol tartrate 12.5 mg twice daily, quinapril 20 mg daily.   Erectile dysfunction Did not have much success with Viagra.  We will switch to Cialis due to likely underlying BPH.  He will follow-up with me in a few weeks via MyChart.  Melanoma [facility]) Follows with dermatology once yearly.  GERD (gastroesophageal reflux disease) Continue Protonix 20 mg daily.  Major depression in remission [facility]) Doing well on Prozac 40 mg daily.  Dyslipidemia associated with type 2 diabetes mellitus ([place]) Check labs.  He is on pravastatin 40 mg daily.  Coronary artery disease Follows with cardiology.  He is on Eliquis and statin.  Thoracic Aortic dilatation [facility]) Follows with cardiology.  Has upcoming CT aortogram.  OSA (obstructive sleep apnea) Started CPAP a couple of months ago.   Atrial fibrillation (HCC) Regular rate and rhythm today.  Anticoagulated on Eliquis 5 mg twice daily and rate controlled on diltiazem 30 mg as needed.  Type 2 diabetes mellitus with vascular disease (HCC) A1c well controlled 6.4 off meds.  Recheck 6 months.  Spinal stenosis of lumbar region Doing well with gabapentin 100 mg nightly.   Preventative Healthcare: Check labs.  He will check with pharmacy regarding shingles vaccine.  He is otherwise up-to-date on vaccines and screenings.  Patient Counseling(The following topics were reviewed and/or handout was  given):  -Nutrition: Stressed importance of moderation in sodium/caffeine intake, saturated fat and cholesterol, caloric balance, sufficient intake of fresh fruits, vegetables, and fiber.  -Stressed the importance of regular exercise.   -Substance Abuse: Discussed cessation/primary prevention of tobacco, alcohol, or other drug use; driving or other dangerous activities under the influence; availability of treatment for abuse.   -Injury prevention: Discussed safety belts, safety helmets, smoke detector, smoking near bedding or upholstery.   -Sexuality: Discussed sexually transmitted diseases, partner selection, use of condoms, avoidance of unintended pregnancy and contraceptive alternatives.   -Dental health: Discussed importance of regular tooth brushing, flossing, and dental visits.  -Health maintenance and immunizations reviewed. Please refer to Health maintenance section.  Return to care in 1 year for next preventative visit.     Subjective:  HPI:  See A/P for status of chronic conditions  For the last couple of months he has noticed increased urination.  He will have to urinate every 2-3 hours.  He has noticed an odor to his urine as well.  He wakes up 2-3 times at night to urinate.  He has never had any issues with prostate in the past.  Recently had COVID has not had any sort of fevers or chills.  He has chronic low back pain that seems to be improving.   Lifestyle Diet: Balanced. Plenty of fruits and vegetables.  Exercise: Going to gym 3 times per week.   Depression screen [facility] 2/9 10/21/2021  Decreased Interest 0  Down, Depressed, Hopeless 0  PHQ - 2 Score 0  Altered sleeping -  Tired, decreased  energy -  Change in appetite -  Feeling bad or failure about yourself  -  Trouble concentrating -  Moving slowly or fidgety/restless -  Suicidal thoughts -  PHQ-9 Score -  Difficult doing work/chores -    Health Maintenance Due  Topic Date Due   Zoster Vaccines- Shingrix (1 of  2) Never done   FOOT EXAM  10/31/2019   COVID-19 Vaccine (4 - Booster for Pfizer series) 10/17/2020     ROS: Per HPI, otherwise a complete review of systems was negative.   PMH:  The following were reviewed and entered/updated in epic: Past Medical History:  Diagnosis Date   Cancer ([place])    hx - melanoma   Chicken pox    Colon polyps    Depression    No need for therapy.  Improved   Diabetes ([place])    Diverticula, colon    Hemorrhoids    Hyperlipidemia    Hypertension    Sepsis ([place])    Tinnitus    UTI (lower urinary tract infection)    Patient Active Problem List   Diagnosis Date Noted   Erectile dysfunction 10/21/2021   Thoracic Aortic dilatation ([place]) 02/13/2021   OSA (obstructive sleep apnea) 12/09/2020   Sensorineural hearing loss (SNHL) of left ear with restricted hearing of right ear 08/19/2020   Subjective tinnitus, bilateral 08/19/2020   Atrial fibrillation ([place]) 06/10/2020   Degeneration of lumbar intervertebral disc 03/31/2020   Low back pain 03/11/2020   Spinal stenosis of lumbar region 08/29/2019   Type 2 diabetes mellitus with vascular disease ([place]) 10/20/2016   Hypertension associated with diabetes ([place]) 11/20/2015   Coronary artery disease 12/10/2011   Dyslipidemia associated with type 2 diabetes mellitus ([place]) 12/10/2011   Major depression in remission ([place]) 12/10/2011   GERD (gastroesophageal reflux disease) 12/10/2011   Hx of colonic polyps 12/10/2011   Melanoma ([place]) 12/10/2011   Past Surgical History:  Procedure Laterality Date   MELANOMA EXCISION     LEFT CHEST   MELANOMA EXCISION      Family History  Problem Relation Age of Onset   Heart Problems Mother        Skipping    Medications- reviewed and updated Current Outpatient Medications  Medication Sig Dispense Refill   apixaban (ELIQUIS) 5 MG TABS tablet TAKE ONE TABLET BY MOUTH TWICE A DAY 60 tablet 5   Blood Glucose Monitoring Suppl (ONE TOUCH ULTRA 2) w/Device KIT Use daily to  check blood sugar. 1 kit 0   diltiazem (CARDIZEM) 30 MG tablet Take 1 tablet every 4 hours AS NEEDED for afib heart rate >100 as long as top blood pressure >100. 45 tablet 1   FLUoxetine (PROZAC) 40 MG capsule Take 1 capsule (40 mg total) by mouth daily. 90 capsule 3   gabapentin (NEURONTIN) 100 MG capsule Take 1 capsule (100 mg total) by mouth at bedtime. 90 capsule 3   glucose blood test strip Use to test blood sugars daily. Dx: E11.9 100 strip 3   ibuprofen (ADVIL,MOTRIN) 200 MG tablet Take 600 mg by mouth as needed for moderate pain.     Lancets (ONETOUCH ULTRASOFT) lancets Use to test blood sugars daily. Dx: E11.9 100 each 12   metoprolol tartrate (LOPRESSOR) 25 MG tablet TAKE 1/2 TABLET BY MOUTH TWO TIMES A DAY 90 tablet 1   pantoprazole (PROTONIX) 20 MG tablet Take 1 tablet (20 mg total) by mouth daily. 90 tablet 3   pravastatin (PRAVACHOL) 40 MG tablet Take 1 tablet (  40 mg total) by mouth daily. 90 tablet 1   quinapril (ACCUPRIL) 20 MG tablet Take 1 tablet (20 mg total) by mouth at bedtime. 90 tablet 1   tadalafil (CIALIS) 10 MG tablet Take 1 tablet (10 mg total) by mouth daily. 90 tablet 3   traMADol (ULTRAM) 50 MG tablet Take 2 tablets (100 mg total) by mouth every 12 (twelve) hours as needed. 60 tablet 5   No current facility-administered medications for this visit.    Allergies-reviewed and updated Allergies  Allergen Reactions   Lipitor [Atorvastatin Calcium]     Muscle ache    Social History   Socioeconomic History   Marital status: Married    Spouse name: Not on file   Number of children: 2   Years of education: Not on file   Highest education level: Not on file  Occupational History   Occupation: Company secretary   Occupation: retired  Tobacco Use   Smoking status: Never   Smokeless tobacco: Never  Substance and Sexual Activity   Alcohol use: No   Drug use: No   Sexual activity: Not on file  Other Topics Concern   Not on file  Social History Narrative   Lives with  wife in [place].   Retired [employer]   Attends [employer] Strain: Low Risk    Difficulty of Paying Living Expenses: Not hard at all  Food Insecurity: No Food Insecurity   Worried About Charity fundraiser in the Last Year: Never true   Arboriculturist in the Last Year: Never true  Transportation Needs: No Transportation Needs   Lack of Transportation (Medical): No   Lack of Transportation (Non-Medical): No  Physical Activity: Sufficiently Active   Days of Exercise per Week: 3 days   Minutes of Exercise per Session: 90 min  Stress: No Stress Concern Present   Feeling of Stress : Not at all  Social Connections: Not on file        Objective:  Physical Exam: BP 136/82    Pulse (!) 57    Temp (!) 97.1 F (36.2 C) (Temporal)    Ht 6' (1.829 m)    Wt 241 lb 9.6 oz (109.6 kg)    SpO2 97%    BMI 32.77 kg/m   Body mass index is 32.77 kg/m. Wt Readings from Last 3 Encounters:  10/21/21 241 lb 9.6 oz (109.6 kg)  10/02/21 250 lb (113.4 kg)  09/30/21 250 lb (113.4 kg)   Gen: NAD, resting comfortably HEENT: TMs normal bilaterally. OP clear. No thyromegaly noted.  CV: RRR with no murmurs appreciated Pulm: NWOB, CTAB with no crackles, wheezes, or rhonchi GI: Normal bowel sounds present. Soft, Nontender, Nondistended. MSK: no edema, cyanosis, or clubbing noted Skin: warm, dry Neuro: CN2-12 grossly intact. Strength 5/5 in upper and lower extremities. Reflexes symmetric and intact bilaterally.  Psych: Normal affect and thought content     Marieme Mcmackin M. [name], MD 10/21/2021 9:16 AM

## 2021-10-21 NOTE — Assessment & Plan Note (Signed)
Doing well on Prozac 40 mg daily. 

## 2021-10-21 NOTE — Assessment & Plan Note (Signed)
Did not have much success with Viagra.  We will switch to Cialis due to likely underlying BPH.  He will follow-up with me in a few weeks via MyChart.

## 2021-10-21 NOTE — Assessment & Plan Note (Signed)
Follows with dermatology once yearly 

## 2021-10-21 NOTE — Assessment & Plan Note (Signed)
Continue Protonix 20 mg daily. 

## 2021-10-21 NOTE — Assessment & Plan Note (Signed)
A1c well controlled 6.4 off meds.  Recheck 6 months.

## 2021-10-22 ENCOUNTER — Encounter: Payer: Self-pay | Admitting: Family Medicine

## 2021-10-22 LAB — URINE CULTURE
MICRO NUMBER:: 12856789
SPECIMEN QUALITY:: ADEQUATE

## 2021-10-22 NOTE — Progress Notes (Signed)
Please inform patient of the following:  We are still waiting on his urine culture but all of his other labs at this point are stable.  Do not need to make any changes to his treatment plan at this time. We will contact him once we have results of his urine culture.   We can recheck everything else in a year.

## 2021-10-22 NOTE — Addendum Note (Signed)
Encounter addended by: Juluis Mire, RN on: 10/22/2021 9:00 AM  Actions taken: Imaging Exam ended

## 2021-10-22 NOTE — Telephone Encounter (Signed)
See lab note.  

## 2021-10-23 ENCOUNTER — Encounter: Payer: Self-pay | Admitting: Family Medicine

## 2021-10-23 NOTE — Progress Notes (Signed)
Please inform patient of the following:  Urine culture is negative.  Algis Greenhouse. [name], MD 10/23/2021 7:51 AM

## 2021-10-23 NOTE — Telephone Encounter (Signed)
See lab results.  

## 2021-10-26 ENCOUNTER — Other Ambulatory Visit: Payer: Self-pay

## 2021-10-26 ENCOUNTER — Encounter: Payer: Self-pay | Admitting: Cardiology

## 2021-10-26 ENCOUNTER — Telehealth (INDEPENDENT_AMBULATORY_CARE_PROVIDER_SITE_OTHER): Payer: Medicare HMO | Admitting: Cardiology

## 2021-10-26 VITALS — Ht 72.0 in | Wt 241.0 lb

## 2021-10-26 DIAGNOSIS — I10 Essential (primary) hypertension: Secondary | ICD-10-CM

## 2021-10-26 DIAGNOSIS — G4733 Obstructive sleep apnea (adult) (pediatric): Secondary | ICD-10-CM

## 2021-10-26 NOTE — Progress Notes (Signed)
Virtual Visit via Video Note   This visit type was conducted due to national recommendations for restrictions regarding the COVID-19 Pandemic (e.g. social distancing) in an effort to limit this patient's exposure and mitigate transmission in our community.  Due to his co-morbid illnesses, this patient is at least at moderate risk for complications without adequate follow up.  This format is felt to be most appropriate for this patient at this time.  All issues noted in this document were discussed and addressed.  A limited physical exam was performed with this format.  Please refer to the patient's chart for his consent to telehealth for [facility].  Date:  10/26/2021   ID:  Brent Taylor, DOB 1941-01-29, MRN [id number] The patient was identified using 2 identifiers.  Patient Location: Home Provider Location: Home Office   PCP:  [name], MD   [facility] Providers Cardiologist:  [name], MD Sleep Medicine:  [name], MD     Evaluation Performed:  Follow-Up Visit  Chief Complaint:  OSA  History of Present Illness:    Brent Taylor is a 81 y.o. male with a hx of DM, HTN, PAF and HLD who was referred for home sleep study due to atrial fibrillation.  He was found to have mild OSA with an AHI of 14.1/hr and O2 sats as low as 83%.  He was started on auto CPAP but was intolerant to the [facility] and had a lot of nasal dryness.  I switched him to a nasal pillow mask and instructed him to adjust his humidity in his device.  He is doing well with his CPAP device and thinks that he has gotten used to it.  He tolerates the mask and feels the pressure is adequate.  Since going on CPAP he he feels rested in the am and has no significant daytime sleepiness.  He denies any significant mouth or nasal dryness or nasal congestion.  He does not think that he snores.     The patient does not have symptoms concerning for COVID-19 infection (fever, chills, cough, or new shortness of  breath).    Past Medical History:  Diagnosis Date   Cancer (HCC)    hx - melanoma   Chicken pox    Colon polyps    Depression    No need for therapy.  Improved   Diabetes (HCC)    Diverticula, colon    Hemorrhoids    Hyperlipidemia    Hypertension    Sepsis (HCC)    Tinnitus    UTI (lower urinary tract infection)    Past Surgical History:  Procedure Laterality Date   MELANOMA EXCISION     LEFT CHEST   MELANOMA EXCISION       Current Meds  Medication Sig   apixaban (ELIQUIS) 5 MG TABS tablet TAKE ONE TABLET BY MOUTH TWICE A DAY   Blood Glucose Monitoring Suppl (ONE TOUCH ULTRA 2) w/Device KIT Use daily to check blood sugar.   diltiazem (CARDIZEM) 30 MG tablet Take 1 tablet every 4 hours AS NEEDED for afib heart rate >100 as long as top blood pressure >100.   FLUoxetine (PROZAC) 40 MG capsule Take 1 capsule (40 mg total) by mouth daily.   gabapentin (NEURONTIN) 100 MG capsule Take 1 capsule (100 mg total) by mouth at bedtime.   glucose blood test strip Use to test blood sugars daily. Dx: E11.9   ibuprofen (ADVIL,MOTRIN) 200 MG tablet Take 600 mg by mouth as needed for  moderate pain.   Lancets (ONETOUCH ULTRASOFT) lancets Use to test blood sugars daily. Dx: E11.9   metoprolol tartrate (LOPRESSOR) 25 MG tablet TAKE 1/2 TABLET BY MOUTH TWO TIMES A DAY   pantoprazole (PROTONIX) 20 MG tablet Take 1 tablet (20 mg total) by mouth daily.   pravastatin (PRAVACHOL) 40 MG tablet Take 1 tablet (40 mg total) by mouth daily.   quinapril (ACCUPRIL) 20 MG tablet Take 1 tablet (20 mg total) by mouth at bedtime.   tadalafil (CIALIS) 10 MG tablet Take 1 tablet (10 mg total) by mouth daily.   traMADol (ULTRAM) 50 MG tablet Take 2 tablets (100 mg total) by mouth every 12 (twelve) hours as needed.     Allergies:   Lipitor [atorvastatin calcium]   Social History   Tobacco Use   Smoking status: Never   Smokeless tobacco: Never  Substance Use Topics   Alcohol use: No   Drug use: No      Family Hx: The patient's family history includes Heart Problems in his mother.  ROS:   Please see the history of present illness.     All other systems reviewed and are negative.   Prior CV studies:   The following studies were reviewed today:  PAP compliance download  Labs/Other Tests and Data Reviewed:    EKG:  No ECG reviewed.  Recent Labs: 10/21/2021: ALT 27; BUN 21; Creatinine, Ser 1.11; Hemoglobin 14.5; Platelets 236.0; Potassium 4.5; Sodium 140; TSH 1.90   Recent Lipid Panel Lab Results  Component Value Date/Time   CHOL 146 10/21/2021 09:23 AM   TRIG 108.0 10/21/2021 09:23 AM   HDL 35.50 (L) 10/21/2021 09:23 AM   CHOLHDL 4 10/21/2021 09:23 AM   LDLCALC 89 10/21/2021 09:23 AM   LDLDIRECT 142.7 12/10/2011 02:36 PM    Wt Readings from Last 3 Encounters:  10/26/21 241 lb (109.3 kg)  10/21/21 241 lb 9.6 oz (109.6 kg)  10/02/21 250 lb (113.4 kg)     Risk Assessment/Calculations:          Objective:    Vital Signs:  Ht 6' (1.829 m)    Wt 241 lb (109.3 kg)    BMI 32.69 kg/m    VITAL SIGNS:  reviewed GEN:  no acute distress EYES:  sclerae anicteric, EOMI - Extraocular Movements Intact RESPIRATORY:  normal respiratory effort, symmetric expansion CARDIOVASCULAR:  no peripheral edema SKIN:  no rash, lesions or ulcers. MUSCULOSKELETAL:  no obvious deformities. NEURO:  alert and oriented x 3, no obvious focal deficit PSYCH:  normal affect  ASSESSMENT & PLAN:    OSA - The patient is tolerating PAP therapy well without any problems. The PAP download performed by his DME was personally reviewed and interpreted by me today and showed an AHI of 2.1 /hr on auto with 57% compliance in using more than 4 hours nightly.  The patient has been using and benefiting from PAP use and will continue to benefit from therapy.  -he had COVID recently and did not use his device at that time -normally he uses his device all night every night -I encouraged him to be compliant with  his device  2.  HTN -BP controlled at home when he checks it -Continue prescription drug management with Lopressor 12.5 mg twice daily and quinapril 20 mg daily with as needed refills    COVID-19 Education: The signs and symptoms of COVID-19 were discussed with the patient and how to seek care for testing (follow up with PCP or arrange E-visit).  The importance of social distancing was discussed today.  Time:   Today, I have spent 20 minutes with the patient with telehealth technology discussing the above problems.     Medication Adjustments/Labs and Tests Ordered: Current medicines are reviewed at length with the patient today.  Concerns regarding medicines are outlined above.   Tests Ordered: No orders of the defined types were placed in this encounter.   Medication Changes: No orders of the defined types were placed in this encounter.   Follow Up:  In Person in 1 year(s)  Signed, Fransico Him, MD  10/26/2021 10:53 AM    [place]

## 2021-10-26 NOTE — Patient Instructions (Signed)
Medication Instructions:  Your physician recommends that you continue on your current medications as directed. Please refer to the Current Medication list given to you today.  *If you need a refill on your cardiac medications before your next appointment, please call your pharmacy*   Lab Work: NONE If you have labs (blood work) drawn today and your tests are completely normal, you will receive your results only by: [place] (if you have MyChart) OR A paper copy in the mail If you have any lab test that is abnormal or we need to change your treatment, we will call you to review the results.   Testing/Procedures: NONE   Follow-Up: At [facility], you and your health needs are our priority.  As part of our continuing mission to provide you with exceptional heart care, we have created designated Provider Care Teams.  These Care Teams include your primary Cardiologist (physician) and Advanced Practice Providers (APPs -  Physician Assistants and Nurse Practitioners) who all work together to provide you with the care you need, when you need it.  Your next appointment:   1 year(s)  The format for your next appointment:   In Person  Provider:   Werner Lean, MD

## 2021-10-27 ENCOUNTER — Encounter: Payer: Self-pay | Admitting: Family Medicine

## 2021-10-27 ENCOUNTER — Telehealth (HOSPITAL_COMMUNITY): Payer: Self-pay | Admitting: *Deleted

## 2021-10-27 NOTE — Telephone Encounter (Signed)
It can be normal for blood pressure to fluctuate from day-to-day and from hour to hour. If he is not having any symptoms of high blood pressure I would recommend he continue to monitor for a few more days and let us know if it is persistently elevated to 150/90 or higher.  Algis Greenhouse. [name], MD 10/27/2021 12:32 PM

## 2021-10-27 NOTE — Telephone Encounter (Signed)
Pt notified. Did not want to pursue EP referral at this point. If symptoms should worsen will reconsider.

## 2021-10-27 NOTE — Telephone Encounter (Signed)
-----   Message from Sherran Needs, NP sent at 10/27/2021  8:45 AM EST ----- Please let pt know that no afib but premature beats as previous monitor has shown. If he is very symptomatic and  wants to be referred to EP, can place referral.

## 2021-10-27 NOTE — Telephone Encounter (Signed)
Please advise 

## 2021-10-28 ENCOUNTER — Other Ambulatory Visit: Payer: Self-pay

## 2021-10-28 ENCOUNTER — Ambulatory Visit (INDEPENDENT_AMBULATORY_CARE_PROVIDER_SITE_OTHER): Payer: Medicare HMO | Admitting: Family Medicine

## 2021-10-28 ENCOUNTER — Encounter: Payer: Self-pay | Admitting: Family Medicine

## 2021-10-28 VITALS — BP 110/60 | HR 78 | Temp 97.7°F | Ht 72.0 in | Wt 244.0 lb

## 2021-10-28 DIAGNOSIS — N529 Male erectile dysfunction, unspecified: Secondary | ICD-10-CM

## 2021-10-28 DIAGNOSIS — I152 Hypertension secondary to endocrine disorders: Secondary | ICD-10-CM | POA: Diagnosis not present

## 2021-10-28 DIAGNOSIS — E1159 Type 2 diabetes mellitus with other circulatory complications: Secondary | ICD-10-CM

## 2021-10-28 DIAGNOSIS — I77819 Aortic ectasia, unspecified site: Secondary | ICD-10-CM

## 2021-10-28 NOTE — Progress Notes (Signed)
Subjective:     Patient ID: Brent Taylor, male    DOB: December 27, 1940, 81 y.o.   MRN: [id number]  Chief Complaint  Patient presents with   Hypertension    Blood pressure high since Monday    HPI Bp elevated for few days-checks twice monthly.  130's/70's.  On Monday-checked it and 160's/100. So came here. Bp cuff was reading error so got new cuff and this is reading high   Taking cialis once daily since Friday for ED  Slight HA intermitt back of head past few days.  No dizziness/cp/sob.   Does have h/o afib-but wore monitor for 2 wks and told no afib recently.   Diltiazem for prn if pulse inc.   Will get CTA on 1/26 for aorta and see card on 1/31  Had CPX last wk and everything ok Health Maintenance Due  Topic Date Due   Zoster Vaccines- Shingrix (1 of 2) Never done   FOOT EXAM  10/31/2019   COVID-19 Vaccine (4 - Booster for Pfizer series) 10/17/2020    Past Medical History:  Diagnosis Date   Cancer ([place])    hx - melanoma   Chicken pox    Colon polyps    Depression    No need for therapy.  Improved   Diabetes ([place])    Diverticula, colon    Hemorrhoids    Hyperlipidemia    Hypertension    Sepsis ([place])    Tinnitus    UTI (lower urinary tract infection)     Past Surgical History:  Procedure Laterality Date   MELANOMA EXCISION     LEFT CHEST   MELANOMA EXCISION      Outpatient Medications Prior to Visit  Medication Sig Dispense Refill   apixaban (ELIQUIS) 5 MG TABS tablet TAKE ONE TABLET BY MOUTH TWICE A DAY 60 tablet 5   Blood Glucose Monitoring Suppl (ONE TOUCH ULTRA 2) w/Device KIT Use daily to check blood sugar. 1 kit 0   diltiazem (CARDIZEM) 30 MG tablet Take 1 tablet every 4 hours AS NEEDED for afib heart rate >100 as long as top blood pressure >100. 45 tablet 1   FLUoxetine (PROZAC) 40 MG capsule Take 1 capsule (40 mg total) by mouth daily. 90 capsule 3   gabapentin (NEURONTIN) 100 MG capsule Take 1 capsule (100 mg total) by mouth at bedtime. 90 capsule 3    glucose blood test strip Use to test blood sugars daily. Dx: E11.9 100 strip 3   ibuprofen (ADVIL,MOTRIN) 200 MG tablet Take 600 mg by mouth as needed for moderate pain.     Lancets (ONETOUCH ULTRASOFT) lancets Use to test blood sugars daily. Dx: E11.9 100 each 12   metoprolol tartrate (LOPRESSOR) 25 MG tablet TAKE 1/2 TABLET BY MOUTH TWO TIMES A DAY 90 tablet 1   pantoprazole (PROTONIX) 20 MG tablet Take 1 tablet (20 mg total) by mouth daily. 90 tablet 3   pravastatin (PRAVACHOL) 40 MG tablet Take 1 tablet (40 mg total) by mouth daily. 90 tablet 1   quinapril (ACCUPRIL) 20 MG tablet Take 1 tablet (20 mg total) by mouth at bedtime. 90 tablet 1   tadalafil (CIALIS) 10 MG tablet Take 1 tablet (10 mg total) by mouth daily. 90 tablet 3   traMADol (ULTRAM) 50 MG tablet Take 2 tablets (100 mg total) by mouth every 12 (twelve) hours as needed. 60 tablet 5   No facility-administered medications prior to visit.    Allergies  Allergen Reactions  Atorvastatin Calcium]   °  Muscle ache  ° °ROS:negative/noncontributory except as noted in HPI ° ° °   °Objective:  °  ° °BP 110/60 (BP Location: Left Arm, Patient Position: Sitting, Cuff Size: Normal)    Pulse 78    Temp 97.7 °F (36.5 °C) (Temporal)    Ht 6' (1.829 m)    Wt 244 lb (110.7 kg)    SpO2 96%    BMI 33.09 kg/m²  °Wt Readings from Last 3 Encounters:  °10/28/21 244 lb (110.7 kg)  °10/26/21 241 lb (109.3 kg)  °10/21/21 241 lb 9.6 oz (109.6 kg)  ° ° °   ° °Gen: WDWN NAD:  bp checked w/2 different cuffs and 110/60 both times I did it °HEENT: NCAT, conjunctiva not injected, sclera nonicteric °CARDIAC: IRRR, S1S2+, no murmur. DP 2+B °LUNGS: CTAB. No wheezes °EXT:  no edema °MSK: no gross abnormalities.  °NEURO: A&O x3.  CN II-XII intact.  °PSYCH: normal mood. Good eye contact ° °Assessment & Plan:  ° °Problem List Items Addressed This Visit   ° °  ° Cardiovascular and Mediastinum  ° Hypertension associated with diabetes (HCC) - Primary  ° Thoracic Aortic  dilatation (HCC)  °  ° Other  ° Erectile dysfunction  ° HTN-bp's elevated at home-new cuff.  Just started daily cialis.  Declined EKG as just had and had event monitor and will be seeing card next wi.  Stop cialis.  Get cuff checked.  Monitor °ED-just started cialis daily-advised to hold for now d/t bp inc °Thoracic aorta dilation/irreg heart beats-seeing card.  Pt deferring to them. ° °No orders of the defined types were placed in this encounter. ° ° ° M , MD ° °

## 2021-10-28 NOTE — Patient Instructions (Signed)
It was very nice to see you today!  As discussed-get blood pressure cuff checked.  Continue to monitor.  Stop the cialis for now.

## 2021-11-05 ENCOUNTER — Ambulatory Visit (INDEPENDENT_AMBULATORY_CARE_PROVIDER_SITE_OTHER)
Admission: RE | Admit: 2021-11-05 | Discharge: 2021-11-05 | Disposition: A | Discharge status: Home / Self Care | Payer: Medicare HMO | Source: Ambulatory Visit | Attending: Internal Medicine | Admitting: Internal Medicine

## 2021-11-05 ENCOUNTER — Other Ambulatory Visit: Payer: Self-pay

## 2021-11-05 DIAGNOSIS — I712 Thoracic aortic aneurysm, without rupture, unspecified: Secondary | ICD-10-CM | POA: Diagnosis not present

## 2021-11-05 DIAGNOSIS — R911 Solitary pulmonary nodule: Secondary | ICD-10-CM | POA: Diagnosis not present

## 2021-11-05 DIAGNOSIS — K449 Diaphragmatic hernia without obstruction or gangrene: Secondary | ICD-10-CM | POA: Diagnosis not present

## 2021-11-05 MED ORDER — IOHEXOL 350 MG/ML SOLN
100.0000 mL | Freq: Once | INTRAVENOUS | Status: AC | PRN
  Administered 2021-11-05: 100 mL via INTRAVENOUS

## 2021-11-09 NOTE — Progress Notes (Signed)
Cardiology Office Note:    Date:  11/10/2021   ID:  [name], DOB [birth year], MRN [id number]  PCP:  [name], MD   [facility] Providers Cardiologist:  [name], MD Sleep Medicine:  [name], MD     Referring MD: [name], MD   CC: Thoracic Aortic Aneurysm f/u  History of Present Illness:    Brent Taylor is a 81 y.o. male with a hx of PAF, CAD win [place] in 2007 with moderate disease NOS, HLD, and incidental mild thoracic aortic aneurysm monitoring (4.0 cm) who presents for evaluation.  Seen 11/10/21 after CT aorta showed mild increase in diameter (42 mm).  Patient notes that he is doing well.   There are no interval hospital/ED visit.   Over Christmas had COVID-19 and hasn't been back to the gym since.  Before was going to the gym and doing fine.   No chest pain or pressure .  No SOB/DOE and no PND/Orthopnea.  No weight gain or leg swelling.  No palpitations or syncope. When in PAF sometimes feels like he fell out.  Ambulatory blood pressure has fluctuated post COVID-19.   Past Medical History:  Diagnosis Date   Cancer (HCC)    hx - melanoma   Chicken pox    Colon polyps    Depression    No need for therapy.  Improved   Diabetes (HCC)    Diverticula, colon    Hemorrhoids    Hyperlipidemia    Hypertension    Sepsis (HCC)    Tinnitus    UTI (lower urinary tract infection)     Past Surgical History:  Procedure Laterality Date   MELANOMA EXCISION     LEFT CHEST   MELANOMA EXCISION      Current Medications: Current Meds  Medication Sig   apixaban (ELIQUIS) 5 MG TABS tablet TAKE ONE TABLET BY MOUTH TWICE A DAY   Blood Glucose Monitoring Suppl (ONE TOUCH ULTRA 2) w/Device KIT Use daily to check blood sugar.   diltiazem (CARDIZEM) 30 MG tablet Take 1 tablet every 4 hours AS NEEDED for afib heart rate >100 as long as top blood pressure >100.   FLUoxetine (PROZAC) 40 MG capsule Take 1 capsule (40 mg total) by mouth daily.   gabapentin  (NEURONTIN) 100 MG capsule Take 1 capsule (100 mg total) by mouth at bedtime.   glucose blood test strip Use to test blood sugars daily. Dx: E11.9   ibuprofen (ADVIL,MOTRIN) 200 MG tablet Take 600 mg by mouth as needed for moderate pain.   Lancets (ONETOUCH ULTRASOFT) lancets Use to test blood sugars daily. Dx: E11.9   metoprolol tartrate (LOPRESSOR) 25 MG tablet TAKE 1/2 TABLET BY MOUTH TWO TIMES A DAY   pantoprazole (PROTONIX) 20 MG tablet Take 1 tablet (20 mg total) by mouth daily.   pravastatin (PRAVACHOL) 80 MG tablet Take 1 tablet (80 mg total) by mouth every evening.   quinapril (ACCUPRIL) 20 MG tablet Take 1 tablet (20 mg total) by mouth at bedtime.   tadalafil (CIALIS) 10 MG tablet Take 1 tablet (10 mg total) by mouth daily.   traMADol (ULTRAM) 50 MG tablet Take 2 tablets (100 mg total) by mouth every 12 (twelve) hours as needed.   [DISCONTINUED] pravastatin (PRAVACHOL) 40 MG tablet Take 1 tablet (40 mg total) by mouth daily.     Allergies:   Lipitor [atorvastatin calcium]   Social History   Socioeconomic History   Marital status: Married  Spouse name: Not on file   Number of children: 2   Years of education: Not on file   Highest education level: Not on file  Occupational History   Occupation: Company secretary   Occupation: retired  Tobacco Use   Smoking status: Never   Smokeless tobacco: Never  Substance and Sexual Activity   Alcohol use: No   Drug use: No   Sexual activity: Not on file  Other Topics Concern   Not on file  Social History Narrative   Lives with wife in [place].   Retired [employer]   Attends [place] Strain: Low Risk    Difficulty of Paying Living Expenses: Not hard at all  Food Insecurity: No Food Insecurity   Worried About Charity fundraiser in the Last Year: Never true   Arboriculturist in the Last Year: Never true  Transportation Needs: No Transportation Needs    Lack of Transportation (Medical): No   Lack of Transportation (Non-Medical): No  Physical Activity: Sufficiently Active   Days of Exercise per Week: 3 days   Minutes of Exercise per Session: 90 min  Stress: No Stress Concern Present   Feeling of Stress : Not at all  Social Connections: Not on file     Family History: The patient's family history includes Heart Problems in his mother. History of coronary artery disease notable for father with CABG. History of heart failure notable for father. History of arrhythmia notable for mother with palpitations. Denies family history of sudden cardiac death including drowning, car accidents, or unexplained deaths in the family. No history of bicuspid aortic valve or aortic aneurysm or dissection.    ROS:   Please see the history of present illness.     All other systems reviewed and are negative.  EKGs/Labs/Other Studies Reviewed:    The following studies were reviewed today:  EKG:  11/09/2021: SR rate 81 1st HB with PACs and LVH 02/11/21: SR Rate 64 PACs LVH  Transthoracic Echocardiogram: Date: 10/16/21 Results: 1. Left ventricular ejection fraction, by estimation, is 50 to 55%. The  left ventricle has low normal function. The left ventricle has no regional  wall motion abnormalities. There is mild-to-moderate concentric left  ventricular hypertrophy. Left  ventricular diastolic parameters are consistent with Grade II diastolic  dysfunction (pseudonormalization).   2. Right ventricular systolic function is normal. The right ventricular  size is normal.   3. Left atrial size was moderately dilated.   4. The mitral valve is grossly normal. Mild mitral valve regurgitation.   5. The aortic valve is tricuspid. There is mild calcification of the  aortic valve. There is mild thickening of the aortic valve. Aortic valve  regurgitation is not visualized. Aortic valve sclerosis/calcification is  present, without any evidence of  aortic stenosis.    6. Aortic dilatation noted. There is moderate dilatation of the ascending  aorta, measuring 44 mm.   NM Stress Testing : Date: 07/25/2012 Results: No issues- cannot pull exam  CT Aorta: Date:11/09/21 Results:  Greatest estimated diameter of the ascending aorta 4.2 cm. Recommend annual imaging followup by CTA or MRA. This recommendation follows 2010 ACCF/AHA/AATS/ACR/ASA/SCA/SCAI/SIR/STS/SVM Guidelines for the Diagnosis and Management of Patients with Thoracic Aortic Disease. Circulation. 2010; 121: P509-T267. Aortic aneurysm NOS (ICD10-I71.9)   Single nodule of 6 mm within the right middle lobe. Non-contrast chest CT at 6-12 months is recommended. If the nodule is stable at  time of repeat CT, then future CT at 18-24 months (from today's scan) is considered optional for low-risk patients, but is recommended for high-risk patients. This recommendation follows the consensus statement: Guidelines for Management of Incidental Pulmonary Nodules Detected on CT Images: From the Fleischner Society 2017; Radiology 2017; 284:228-243.   Left/Right Heart Catheterizations: Date: 09/21/2006 Results: [place] and unable to pull up films; patient suggests moderate disease by discription   Recent Labs: 10/21/2021: ALT 27; BUN 21; Creatinine, Ser 1.11; Hemoglobin 14.5; Platelets 236.0; Potassium 4.5; Sodium 140; TSH 1.90  Recent Lipid Panel    Component Value Date/Time   CHOL 146 10/21/2021 0923   TRIG 108.0 10/21/2021 0923   HDL 35.50 (L) 10/21/2021 0923   CHOLHDL 4 10/21/2021 0923   VLDL 21.6 10/21/2021 0923   LDLCALC 89 10/21/2021 0923   LDLDIRECT 142.7 12/10/2011 1436     Risk Assessment/Calculations:    CHA2DS2-VASc Score = 4  This indicates a 4.8% annual risk of stroke. The patient's score is based upon: CHF History: 0 HTN History: 1 Diabetes History: 0 Stroke History: 0 Vascular Disease History: 1 Age Score: 2 Gender Score: 0       Physical Exam:    VS:  BP  140/90    Pulse 61    Ht 6' (1.829 m)    Wt 112 kg    SpO2 95%    BMI 33.50 kg/m     Wt Readings from Last 3 Encounters:  11/10/21 112 kg  10/28/21 110.7 kg  10/26/21 109.3 kg     Gen: No distress,   Neck: No JVD,  Ears: Bilateral Homero Fellers Sign Cardiac: No Rubs or Gallops, no Murmur, regular rhythm +2 radial pulses, has good left popliteal pulses (was told he had problems behind his L knee in the past) Respiratory: Clear to auscultation bilaterally, normal effort, normal  respiratory rate GI: Soft, nontender, non-distended MS: No  edema;  moves all extremities Integument: Skin feels warm,  Neuro:  At time of evaluation, alert and oriented to person/place/time/situation  Psych: Normal affect, patient feels well   ASSESSMENT:    1. Thoracic Aortic dilatation (HCC)   2. Paroxysmal atrial fibrillation (HCC)   3. OSA (obstructive sleep apnea)   4. Coronary artery disease involving native coronary artery of native heart without angina pectoris     PLAN:     PAF- CHADSVASC 6 per EP (AGE x2, HTN, CAD; ? Of CHF with 2017 mrEF) on Eliquis OSA- with difficulty with mask  HTN HLD and Aortic atherosclerosis CAD mod non obstructive LHC in [facility] Thoracic Aortic Aneurysm: 42 mm - CT in one year - Continue Eliquis and metoprolol and PRN diltiazem for AF; low burden and symptomatic when it occurs discussed CPAP use (presently minimal) - continue quinapil and for BP, he wil restart AMB BP monitoring and if still increased by Valentine's day will increase ACEi - continue statin for CAD (no plavix on eliquis) LDL above 70, pravastatin to 80 and labs in three months (had atorvastatin mylagias and knows if sx will back down and notify us) - discussed dietary changes  One year me or APP       Medication Adjustments/Labs and Tests Ordered: Current medicines are reviewed at length with the patient today.  Concerns regarding medicines are outlined above.  Orders Placed This Encounter  Procedures    CT ANGIO CHEST AORTA W/CM & OR WO/CM   Lipid panel   ALT   Basic metabolic panel   EKG 12-Lead  Meds ordered this encounter  Medications   pravastatin (PRAVACHOL) 80 MG tablet    Sig: Take 1 tablet (80 mg total) by mouth every evening.    Dispense:  90 tablet    Refill:  3    Patient Instructions  Medication Instructions:  Your physician has recommended you make the following change in your medication:  INCREASE: Pravastatin to 80 mg by mouth once daily  *If you need a refill on your cardiac medications before your next appointment, please call your pharmacy*   Lab Work: IN 3 MONTHS: FLP, ALT PRIOR TO CT AORTA: BMP If you have labs (blood work) drawn today and your tests are completely normal, you will receive your results only by: [place] (if you have MyChart) OR A paper copy in the mail If you have any lab test that is abnormal or we need to change your treatment, we will call you to review the results.   Testing/Procedures: Your physician has requested that you have a CT Aorta in 1 year.   Follow-Up: At [facility], you and your health needs are our priority.  As part of our continuing mission to provide you with exceptional heart care, we have created designated Provider Care Teams.  These Care Teams include your primary Cardiologist (physician) and Advanced Practice Providers (APPs -  Physician Assistants and Nurse Practitioners) who all work together to provide you with the care you need, when you need it.    Your next appointment:   12-13  month(s)  The format for your next appointment:   In Person  Provider:   Werner Lean, MD      Other Instructions Monitor Blood pressure as discussed with Dr. Gasper Sells    Signed, Werner Lean, MD  11/10/2021 9:53 AM    [place]

## 2021-11-10 ENCOUNTER — Encounter: Payer: Self-pay | Admitting: Internal Medicine

## 2021-11-10 ENCOUNTER — Other Ambulatory Visit: Payer: Self-pay

## 2021-11-10 ENCOUNTER — Ambulatory Visit: Payer: Medicare HMO | Admitting: Internal Medicine

## 2021-11-10 VITALS — BP 140/90 | HR 61 | Ht 72.0 in | Wt 247.0 lb

## 2021-11-10 DIAGNOSIS — G4733 Obstructive sleep apnea (adult) (pediatric): Secondary | ICD-10-CM

## 2021-11-10 DIAGNOSIS — I509 Heart failure, unspecified: Secondary | ICD-10-CM | POA: Diagnosis not present

## 2021-11-10 DIAGNOSIS — I2510 Atherosclerotic heart disease of native coronary artery without angina pectoris: Secondary | ICD-10-CM | POA: Diagnosis not present

## 2021-11-10 DIAGNOSIS — I480 Paroxysmal atrial fibrillation: Secondary | ICD-10-CM

## 2021-11-10 DIAGNOSIS — I77819 Aortic ectasia, unspecified site: Secondary | ICD-10-CM | POA: Diagnosis not present

## 2021-11-10 DIAGNOSIS — E1159 Type 2 diabetes mellitus with other circulatory complications: Secondary | ICD-10-CM

## 2021-11-10 MED ORDER — PRAVASTATIN SODIUM 80 MG PO TABS: 80.0000 mg | ORAL_TABLET | Freq: Every evening | ORAL | 3 refills | Status: DC

## 2021-11-10 NOTE — Patient Instructions (Addendum)
Medication Instructions:  Your physician has recommended you make the following change in your medication:  INCREASE: Pravastatin to 80 mg by mouth once daily  *If you need a refill on your cardiac medications before your next appointment, please call your pharmacy*   Lab Work: IN 3 MONTHS: FLP, ALT PRIOR TO CT AORTA: BMP If you have labs (blood work) drawn today and your tests are completely normal, you will receive your results only by: [place] (if you have MyChart) OR A paper copy in the mail If you have any lab test that is abnormal or we need to change your treatment, we will call you to review the results.   Testing/Procedures: Your physician has requested that you have a CT Aorta in 1 year.   Follow-Up: At [facility], you and your health needs are our priority.  As part of our continuing mission to provide you with exceptional heart care, we have created designated Provider Care Teams.  These Care Teams include your primary Cardiologist (physician) and Advanced Practice Providers (APPs -  Physician Assistants and Nurse Practitioners) who all work together to provide you with the care you need, when you need it.    Your next appointment:   12-13  month(s)  The format for your next appointment:   In Person  Provider:   Werner Lean, MD      Other Instructions Monitor Blood pressure as discussed with Dr. Gasper Sells

## 2021-11-20 DIAGNOSIS — G4733 Obstructive sleep apnea (adult) (pediatric): Secondary | ICD-10-CM | POA: Diagnosis not present

## 2021-11-27 ENCOUNTER — Telehealth: Payer: Self-pay | Admitting: Internal Medicine

## 2021-11-27 ENCOUNTER — Telehealth: Payer: Self-pay | Admitting: Family Medicine

## 2021-11-27 MED ORDER — QUINAPRIL HCL 40 MG PO TABS: 40.0000 mg | ORAL_TABLET | Freq: Every day | ORAL | 3 refills | Status: DC

## 2021-11-27 NOTE — Telephone Encounter (Signed)
Called pt informed of MD recommendation to increase quinapril 40 mg PO QD.  Pt is agreeable to plan order placed.

## 2021-11-27 NOTE — Telephone Encounter (Signed)
Copied from [place] [id number]. Topic: Medicare AWV >> Nov 27, 2021 10:25 AM Harris-Coley, Hannah Beat wrote: Reason for CRM: Left message for patient to schedule Annual Wellness Visit.  Please schedule with Nurse Health Advisor Charlott Rakes, RN at [facility].  Please call [phone] ask for [facility]

## 2021-11-27 NOTE — Telephone Encounter (Signed)
Ambualtory BP reviewed. Average BP ~ 146/90  We had discussed at last visit that if his BP was persistently elevated we would increase to quinapril 40 mg PO daily.  Will get BMP in two weeks on new dose.  Rudean Haskell, MD [address] [place], [place] [address] [phone]  9:53 AM

## 2021-11-27 NOTE — Addendum Note (Signed)
Addended by: Precious Gilding on: 11/27/2021 05:17 PM   Modules accepted: Orders

## 2021-12-03 ENCOUNTER — Ambulatory Visit (INDEPENDENT_AMBULATORY_CARE_PROVIDER_SITE_OTHER): Payer: Medicare HMO

## 2021-12-03 ENCOUNTER — Encounter: Payer: Self-pay | Admitting: Internal Medicine

## 2021-12-03 ENCOUNTER — Telehealth: Payer: Medicare HMO

## 2021-12-03 ENCOUNTER — Other Ambulatory Visit: Payer: Self-pay

## 2021-12-03 ENCOUNTER — Telehealth: Payer: Self-pay | Admitting: *Deleted

## 2021-12-03 DIAGNOSIS — Z0000 Encounter for general adult medical examination without abnormal findings: Secondary | ICD-10-CM

## 2021-12-03 NOTE — Progress Notes (Signed)
Virtual Visit via Telephone Note  I connected with  Fleet Hohler on 12/03/21 at  9:30 AM EST by telephone and verified that I am speaking with the correct person using two identifiers.  Medicare Annual Wellness visit completed telephonically due to Covid-19 pandemic.   Persons participating in this call: This Health Coach and this patient.   Location: Patient: Home Provider: Office    I discussed the limitations, risks, security and privacy concerns of performing an evaluation and management service by telephone and the availability of in person appointments. The patient expressed understanding and agreed to proceed.  Unable to perform video visit due to video visit attempted and failed and/or patient does not have video capability.   Some vital signs may be absent or patient reported.   Willette Brace, LPN   Subjective:   Marty Uy is a 81 y.o. male who presents for Medicare Annual/Subsequent preventive examination.  Review of Systems     Cardiac Risk Factors include: advanced age (>84men, >30 women);hypertension;diabetes mellitus;dyslipidemia;male gender;obesity (BMI >30kg/m2)     Objective:    There were no vitals filed for this visit. There is no height or weight on file to calculate BMI.  Advanced Directives 12/03/2021 12/04/2020 08/11/2020 07/17/2019 09/14/2016 04/25/2016 12/28/2015  Does Patient Have a Medical Advance Directive? Yes Yes Yes Yes Yes No No  Type of Advance Directive Living will Living will;Healthcare Power of [place];Living will Living will;Healthcare Power of Attorney - - -  Does patient want to make changes to medical advance directive? - No - Patient declined - No - Patient declined - - -  Copy of [place] in Chart? - No - copy requested No - copy requested No - copy requested - - -  Would patient like information on creating a medical advance directive? - - - - - - No - patient declined information     Current Medications (verified) Outpatient Encounter Medications as of 12/03/2021  Medication Sig   apixaban (ELIQUIS) 5 MG TABS tablet TAKE ONE TABLET BY MOUTH TWICE A DAY   Blood Glucose Monitoring Suppl (ONE TOUCH ULTRA 2) w/Device KIT Use daily to check blood sugar.   FLUoxetine (PROZAC) 40 MG capsule Take 1 capsule (40 mg total) by mouth daily.   gabapentin (NEURONTIN) 100 MG capsule Take 1 capsule (100 mg total) by mouth at bedtime.   glucose blood test strip Use to test blood sugars daily. Dx: E11.9   Lancets (ONETOUCH ULTRASOFT) lancets Use to test blood sugars daily. Dx: E11.9   metoprolol tartrate (LOPRESSOR) 25 MG tablet TAKE 1/2 TABLET BY MOUTH TWO TIMES A DAY   pantoprazole (PROTONIX) 20 MG tablet Take 1 tablet (20 mg total) by mouth daily.   pravastatin (PRAVACHOL) 80 MG tablet Take 1 tablet (80 mg total) by mouth every evening.   quinapril (ACCUPRIL) 40 MG tablet Take 1 tablet (40 mg total) by mouth at bedtime.   tadalafil (CIALIS) 10 MG tablet Take 1 tablet (10 mg total) by mouth daily.   diltiazem (CARDIZEM) 30 MG tablet Take 1 tablet every 4 hours AS NEEDED for afib heart rate >100 as long as top blood pressure >100. (Patient not taking: Reported on 12/03/2021)   ibuprofen (ADVIL,MOTRIN) 200 MG tablet Take 600 mg by mouth as needed for moderate pain. (Patient not taking: Reported on 12/03/2021)   traMADol (ULTRAM) 50 MG tablet Take 2 tablets (100 mg total) by mouth every 12 (twelve) hours as needed. (Patient not taking:  Reported on 12/03/2021)   No facility-administered encounter medications on file as of 12/03/2021.    Allergies (verified) Lipitor [atorvastatin calcium]   History: Past Medical History:  Diagnosis Date   Cancer ([place])    hx - melanoma   Chicken pox    Colon polyps    Depression    No need for therapy.  Improved   Diabetes ([facility])    Diverticula, colon    Hemorrhoids    Hyperlipidemia    Hypertension    Sepsis ([place])    Tinnitus    UTI (lower  urinary tract infection)    Past Surgical History:  Procedure Laterality Date   MELANOMA EXCISION     LEFT CHEST   MELANOMA EXCISION     Family History  Problem Relation Age of Onset   Heart Problems Mother        Skipping   Social History   Socioeconomic History   Marital status: Married    Spouse name: Not on file   Number of children: 2   Years of education: Not on file   Highest education level: Not on file  Occupational History   Occupation: Company secretary   Occupation: retired  Tobacco Use   Smoking status: Never   Smokeless tobacco: Never  Substance and Sexual Activity   Alcohol use: No   Drug use: No   Sexual activity: Not on file  Other Topics Concern   Not on file  Social History Narrative   Lives with wife in [place].   Retired [employer]   Attends [place] Strain: Low Risk    Difficulty of Paying Living Expenses: Not hard at all  Food Insecurity: No Food Insecurity   Worried About Charity fundraiser in the Last Year: Never true   Arboriculturist in the Last Year: Never true  Transportation Needs: No Transportation Needs   Lack of Transportation (Medical): No   Lack of Transportation (Non-Medical): No  Physical Activity: Sufficiently Active   Days of Exercise per Week: 3 days   Minutes of Exercise per Session: 60 min  Stress: No Stress Concern Present   Feeling of Stress : Not at all  Social Connections: Moderately Integrated   Frequency of Communication with Friends and Family: Three times a week   Frequency of Social Gatherings with Friends and Family: More than three times a week   Attends Religious Services: More than 4 times per year   Active Member of [employer] or Organizations: No   Attends Music therapist: Never   Marital Status: Married    Tobacco Counseling Counseling given: Not Answered   Clinical Intake:  Pre-visit preparation completed:  Yes  Pain : No/denies pain     BMI - recorded: 33.09 Nutritional Status: BMI > 30  Obese Nutritional Risks: None Diabetes: Yes CBG done?: Yes (128) CBG resulted in Enter/ Edit results?: No Did pt. bring in CBG monitor from home?: No  How often do you need to have someone help you when you read instructions, pamphlets, or other written materials from your doctor or pharmacy?: 1 - Never  Diabetic?Nutrition Risk Assessment:  Has the patient had any N/V/D within the last 2 months?  No  Does the patient have any non-healing wounds?  No  Has the patient had any unintentional weight loss or weight gain?  No   Diabetes:  Is the patient diabetic?  Yes  If diabetic, was a CBG obtained today?  Yes  Did the patient bring in their glucometer from home?  No  How often do you monitor your CBG's? Daily .   Financial Strains and Diabetes Management:  Are you having any financial strains with the device, your supplies or your medication? No .  Does the patient want to be seen by Chronic Care Management for management of their diabetes?  No  Would the patient like to be referred to a Nutritionist or for Diabetic Management?  No   Diabetic Exams:  Diabetic Eye Exam: Completed 05/21/21 Diabetic Foot Exam: Overdue, Pt has been advised about the importance in completing this exam. Pt is scheduled for diabetic foot exam on next appt .   Interpreter Needed?: No  Information entered by :: Charlott Rakes, LPN   Activities of Daily Living In your present state of health, do you have any difficulty performing the following activities: 12/03/2021  Hearing? Y  Comment wears hearing aids at times  Vision? N  Difficulty concentrating or making decisions? N  Walking or climbing stairs? N  Dressing or bathing? N  Doing errands, shopping? N  Preparing Food and eating ? N  Using the Toilet? N  In the past six months, have you accidently leaked urine? N  Do you have problems with loss of bowel  control? N  Managing your Medications? N  Managing your Finances? N  Housekeeping or managing your Housekeeping? N  Some recent data might be hidden    Patient Care Team: [name], MD as PCP - General (Family Medicine) Werner Lean, MD as PCP - Cardiology (Cardiology) Sueanne Margarita, MD as PCP - Sleep Medicine (Cardiology) Madelin Rear, [facility] (Pharmacist) Madelin Rear, [facility] as Pharmacist (Pharmacist) Leona Singleton, RN as Case Manager  Indicate any recent Medical Services you may have received from other than [facility] providers in the past year (date may be approximate).     Assessment:   This is a routine wellness examination for [place].  Hearing/Vision screen Hearing Screening - Comments:: Pt wears hearing aids  Vision Screening - Comments:: Pt follows up with Dr Meryl Crutch for annual eye exams   Dietary issues and exercise activities discussed: Current Exercise Habits: Structured exercise class, Type of exercise: treadmill;walking, Time (Minutes): 60, Frequency (Times/Week): 3, Weekly Exercise (Minutes/Week): 180   Goals Addressed             This Visit's Progress    Patient Stated       Patient Stated       Get blood pressure down        Depression Screen PHQ 2/9 Scores 12/03/2021 10/21/2021 12/04/2020 10/13/2020 08/11/2020 07/17/2019 05/22/2019  PHQ - 2 Score 0 0 0 1 0 0 0  PHQ- 9 Score - - - 4 - - 1    Fall Risk Fall Risk  12/03/2021 12/09/2020 12/04/2020 08/11/2020 11/05/2019  Falls in the past year? 0 0 0 0 0  Comment - - - - -  Number falls in past yr: 0 - 0 0 -  Injury with Fall? 0 - 0 0 -  Risk for fall due to : Impaired vision;Impaired balance/gait - Impaired vision;Medication side effect Impaired vision;Impaired balance/gait -  Follow up Falls prevention discussed - Falls evaluation completed;Education provided;Falls prevention discussed Falls prevention discussed -    FALL RISK PREVENTION PERTAINING TO THE HOME:  Any stairs in or around the home?  Yes  If so, are there any without  handrails? No  Home free of loose throw rugs in walkways, pet beds, electrical cords, etc? Yes  Adequate lighting in your home to reduce risk of falls? Yes   ASSISTIVE DEVICES UTILIZED TO PREVENT FALLS:  Life alert? No  Use of a cane, walker or w/c? No  Grab bars in the bathroom? Yes  Shower chair or bench in shower? No  Elevated toilet seat or a handicapped toilet? No   TIMED UP AND GO:  Was the test performed? No .   Cognitive Function:     6CIT Screen 12/03/2021 08/11/2020 07/17/2019 09/14/2016  What Year? 0 points 0 points 0 points 0 points  What month? 0 points 0 points 0 points 0 points  What time? 0 points - 0 points 0 points  Count back from 20 0 points 0 points 0 points 0 points  Months in reverse 0 points 0 points 0 points 0 points  Repeat phrase 0 points 0 points 0 points 0 points  Total Score 0 - 0 0    Immunizations Immunization History  Administered Date(s) Administered   Fluad Quad(high Dose 65+) 06/25/2021   Influenza Split 07/14/2012   Influenza, High Dose Seasonal PF 07/28/2015, 09/14/2016, 10/07/2017, 07/11/2018, 05/31/2019   Influenza,inj,Quad PF,6+ Mos 07/16/2013, 07/25/2014   Influenza,inj,quad, With Preservative 07/16/2019   PFIZER(Purple Top)SARS-COV-2 Vaccination 11/19/2019, 12/14/2019, 08/22/2020   Pneumococcal Conjugate-13 03/13/2014   Pneumococcal Polysaccharide-23 07/16/2013   Tetanus 03/13/2014    TDAP status: Up to date  Flu Vaccine status: Up to date  Pneumococcal vaccine status: Up to date  Covid-19 vaccine status: Completed vaccines  Qualifies for Shingles Vaccine? Yes   Zostavax completed No   Shingrix Completed?: No.    Education has been provided regarding the importance of this vaccine. Patient has been advised to call insurance company to determine out of pocket expense if they have not yet received this vaccine. Advised may also receive vaccine at local pharmacy or Health Dept. Verbalized  acceptance and understanding.  Screening Tests Health Maintenance  Topic Date Due   Zoster Vaccines- Shingrix (1 of 2) Never done   FOOT EXAM  10/31/2019   COVID-19 Vaccine (4 - Booster for Pfizer series) 10/17/2020   HEMOGLOBIN A1C  04/20/2022   OPHTHALMOLOGY EXAM  05/21/2022   TETANUS/TDAP  03/13/2024   Pneumonia Vaccine 62+ Years old  Completed   INFLUENZA VACCINE  Completed   HPV VACCINES  Aged Out    Health Maintenance  Health Maintenance Due  Topic Date Due   Zoster Vaccines- Shingrix (1 of 2) Never done   FOOT EXAM  10/31/2019   COVID-19 Vaccine (4 - Booster for Pfizer series) 10/17/2020    Colorectal cancer screening: No longer required.    Additional Screening:   Vision Screening: Recommended annual ophthalmology exams for early detection of glaucoma and other disorders of the eye. Is the patient up to date with their annual eye exam?  Yes  Who is the provider or what is the name of the office in which the patient attends annual eye exams? Dr Meryl Crutch If pt is not established with a provider, would they like to be referred to a provider to establish care? No .   Dental Screening: Recommended annual dental exams for proper oral hygiene  Community Resource Referral / Chronic Care Management: CRR required this visit?  No   CCM required this visit?  No      Plan:     I have personally reviewed and noted the following in the  patients chart:   Medical and social history Use of alcohol, tobacco or illicit drugs  Current medications and supplements including opioid prescriptions. Patient is currently taking opioid prescriptions. Information provided to patient regarding non-opioid alternatives. Patient advised to discuss non-opioid treatment plan with their provider. Functional ability and status Nutritional status Physical activity Advanced directives List of other physicians Hospitalizations, surgeries, and ER visits in previous 12  months Vitals Screenings to include cognitive, depression, and falls Referrals and appointments  In addition, I have reviewed and discussed with patient certain preventive protocols, quality metrics, and best practice recommendations. A written personalized care plan for preventive services as well as general preventive health recommendations were provided to patient.     Willette Brace, LPN   2/82/0813   Nurse Notes: None

## 2021-12-03 NOTE — Telephone Encounter (Signed)
°  Care Management   Follow Up Note   12/03/2021 Name: Brent Taylor MRN: [id number] DOB: 17-Oct-1940   Referred by: [name], MD Reason for referral : Chronic Care Management (DM,HTN)   An unsuccessful telephone outreach was attempted today. The patient was referred to the case management team for assistance with care management and care coordination.   Follow Up Plan: RNCM will seek assistance from [facility] Gu[facility] in rescheduling appointment within the next 30 days.  Hubert Azure RN, MSN RN Care Management Coordinator  [facility] [phone] [email or web address]

## 2021-12-03 NOTE — Patient Instructions (Addendum)
[name] , Thank you for taking time to come for your Medicare Wellness Visit. I appreciate your ongoing commitment to your health goals. Please review the following plan we discussed and let me know if I can assist you in the future.   Screening recommendations/referrals: Colonoscopy: No longer required  Recommended yearly ophthalmology/optometry visit for glaucoma screening and checkup Recommended yearly dental visit for hygiene and checkup  Vaccinations: Influenza vaccine: Done 06/25/21 repeat every year  Pneumococcal vaccine: Up to date Tdap vaccine: done 03/13/14 repeat every 10 years  Shingles vaccine: Shingrix discussed. Please contact your pharmacy for coverage information.    Covid-19: Completed 2/8, 3/5, & 08/22/20  Advanced directives: Please bring a copy of your health care power of attorney and living will to the office at your convenience.  Conditions/risks identified: get blood pressure down to normal levels  Next appointment: Follow up in one year for your annual wellness visit.   Preventive Care 83 Years and Older, Male Preventive care refers to lifestyle choices and visits with your health care provider that can promote health and wellness. What does preventive care include? A yearly physical exam. This is also called an annual well check. Dental exams once or twice a year. Routine eye exams. Ask your health care provider how often you should have your eyes checked. Personal lifestyle choices, including: Daily care of your teeth and gums. Regular physical activity. Eating a healthy diet. Avoiding tobacco and drug use. Limiting alcohol use. Practicing safe sex. Taking low doses of aspirin every day. Taking vitamin and mineral supplements as recommended by your health care provider. What happens during an annual well check? The services and screenings done by your health care provider during your annual well check will depend on your age, overall health, lifestyle  risk factors, and family history of disease. Counseling  Your health care provider may ask you questions about your: Alcohol use. Tobacco use. Drug use. Emotional well-being. Home and relationship well-being. Sexual activity. Eating habits. History of falls. Memory and ability to understand (cognition). Work and work Statistician. Screening  You may have the following tests or measurements: Height, weight, and BMI. Blood pressure. Lipid and cholesterol levels. These may be checked every 5 years, or more frequently if you are over 59 years old. Skin check. Lung cancer screening. You may have this screening every year starting at age 34 if you have a 30-pack-year history of smoking and currently smoke or have quit within the past 15 years. Fecal occult blood test (FOBT) of the stool. You may have this test every year starting at age 63. Flexible sigmoidoscopy or colonoscopy. You may have a sigmoidoscopy every 5 years or a colonoscopy every 10 years starting at age 43. Prostate cancer screening. Recommendations will vary depending on your family history and other risks. Hepatitis C blood test. Hepatitis B blood test. Sexually transmitted disease (STD) testing. Diabetes screening. This is done by checking your blood sugar (glucose) after you have not eaten for a while (fasting). You may have this done every 1-3 years. Abdominal aortic aneurysm (AAA) screening. You may need this if you are a current or former smoker. Osteoporosis. You may be screened starting at age 9 if you are at high risk. Talk with your health care provider about your test results, treatment options, and if necessary, the need for more tests. Vaccines  Your health care provider may recommend certain vaccines, such as: Influenza vaccine. This is recommended every year. Tetanus, diphtheria, and acellular pertussis (Tdap, Td)  vaccine. You may need a Td booster every 10 years. Zoster vaccine. You may need this after age  59. Pneumococcal 13-valent conjugate (PCV13) vaccine. One dose is recommended after age 35. Pneumococcal polysaccharide (PPSV23) vaccine. One dose is recommended after age 18. Talk to your health care provider about which screenings and vaccines you need and how often you need them. This information is not intended to replace advice given to you by your health care provider. Make sure you discuss any questions you have with your health care provider. Document Released: 10/24/2015 Document Revised: 06/16/2016 Document Reviewed: 07/29/2015 Elsevier Interactive Patient Education  2017 [place] Prevention in the Home Falls can cause injuries. They can happen to people of all ages. There are many things you can do to make your home safe and to help prevent falls. What can I do on the outside of my home? Regularly fix the edges of walkways and driveways and fix any cracks. Remove anything that might make you trip as you walk through a door, such as a raised step or threshold. Trim any bushes or trees on the path to your home. Use bright outdoor lighting. Clear any walking paths of anything that might make someone trip, such as rocks or tools. Regularly check to see if handrails are loose or broken. Make sure that both sides of any steps have handrails. Any raised decks and porches should have guardrails on the edges. Have any leaves, snow, or ice cleared regularly. Use sand or salt on walking paths during winter. Clean up any spills in your garage right away. This includes oil or grease spills. What can I do in the bathroom? Use night lights. Install grab bars by the toilet and in the tub and shower. Do not use towel bars as grab bars. Use non-skid mats or decals in the tub or shower. If you need to sit down in the shower, use a plastic, non-slip stool. Keep the floor dry. Clean up any water that spills on the floor as soon as it happens. Remove soap buildup in the tub or shower  regularly. Attach bath mats securely with double-sided non-slip rug tape. Do not have throw rugs and other things on the floor that can make you trip. What can I do in the bedroom? Use night lights. Make sure that you have a light by your bed that is easy to reach. Do not use any sheets or blankets that are too big for your bed. They should not hang down onto the floor. Have a firm chair that has side arms. You can use this for support while you get dressed. Do not have throw rugs and other things on the floor that can make you trip. What can I do in the kitchen? Clean up any spills right away. Avoid walking on wet floors. Keep items that you use a lot in easy-to-reach places. If you need to reach something above you, use a strong step stool that has a grab bar. Keep electrical cords out of the way. Do not use floor polish or wax that makes floors slippery. If you must use wax, use non-skid floor wax. Do not have throw rugs and other things on the floor that can make you trip. What can I do with my stairs? Do not leave any items on the stairs. Make sure that there are handrails on both sides of the stairs and use them. Fix handrails that are broken or loose. Make sure that handrails are as long  as the stairways. Check any carpeting to make sure that it is firmly attached to the stairs. Fix any carpet that is loose or worn. Avoid having throw rugs at the top or bottom of the stairs. If you do have throw rugs, attach them to the floor with carpet tape. Make sure that you have a light switch at the top of the stairs and the bottom of the stairs. If you do not have them, ask someone to add them for you. What else can I do to help prevent falls? Wear shoes that: Do not have high heels. Have rubber bottoms. Are comfortable and fit you well. Are closed at the toe. Do not wear sandals. If you use a stepladder: Make sure that it is fully opened. Do not climb a closed stepladder. Make sure that  both sides of the stepladder are locked into place. Ask someone to hold it for you, if possible. Clearly mark and make sure that you can see: Any grab bars or handrails. First and last steps. Where the edge of each step is. Use tools that help you move around (mobility aids) if they are needed. These include: Canes. Walkers. Scooters. Crutches. Turn on the lights when you go into a dark area. Replace any light bulbs as soon as they burn out. Set up your furniture so you have a clear path. Avoid moving your furniture around. If any of your floors are uneven, fix them. If there are any pets around you, be aware of where they are. Review your medicines with your doctor. Some medicines can make you feel dizzy. This can increase your chance of falling. Ask your doctor what other things that you can do to help prevent falls. This information is not intended to replace advice given to you by your health care provider. Make sure you discuss any questions you have with your health care provider. Document Released: 07/24/2009 Document Revised: 03/04/2016 Document Reviewed: 11/01/2014 Elsevier Interactive Patient Education  2017 [employer].

## 2021-12-08 ENCOUNTER — Ambulatory Visit (INDEPENDENT_AMBULATORY_CARE_PROVIDER_SITE_OTHER): Payer: Medicare HMO | Admitting: *Deleted

## 2021-12-08 DIAGNOSIS — E1159 Type 2 diabetes mellitus with other circulatory complications: Secondary | ICD-10-CM

## 2021-12-08 DIAGNOSIS — I10 Essential (primary) hypertension: Secondary | ICD-10-CM

## 2021-12-08 NOTE — Chronic Care Management (AMB) (Signed)
Chronic Care Management   CCM RN Visit Note  12/08/2021 Name: Brent Taylor MRN: [id number] DOB: 09-27-1941  Subjective: Brent Taylor is a 81 y.o. year old male who is a primary care patient of [name], MD. The care management team was consulted for assistance with disease management and care coordination needs.    Engaged with patient by telephone for follow up visit in response to provider referral for case management and/or care coordination services.   Consent to Services:  The patient was given information about Chronic Care Management services, agreed to services, and gave verbal consent prior to initiation of services.  Please see initial visit note for detailed documentation.   Patient agreed to services and verbal consent obtained.   Assessment: Review of patient past medical history, allergies, medications, health status, including review of consultants reports, laboratory and other test data, was performed as part of comprehensive evaluation and provision of chronic care management services.   SDOH (Social Determinants of Health) assessments and interventions performed:    CCM Care Plan  Allergies  Allergen Reactions   Lipitor [Atorvastatin Calcium]     Muscle ache    Outpatient Encounter Medications as of 12/08/2021  Medication Sig   pravastatin (PRAVACHOL) 80 MG tablet Take 1 tablet (80 mg total) by mouth every evening.   quinapril (ACCUPRIL) 40 MG tablet Take 1 tablet (40 mg total) by mouth at bedtime.   apixaban (ELIQUIS) 5 MG TABS tablet TAKE ONE TABLET BY MOUTH TWICE A DAY   Blood Glucose Monitoring Suppl (ONE TOUCH ULTRA 2) w/Device KIT Use daily to check blood sugar.   diltiazem (CARDIZEM) 30 MG tablet Take 1 tablet every 4 hours AS NEEDED for afib heart rate >100 as long as top blood pressure >100. (Patient not taking: Reported on 12/03/2021)   FLUoxetine (PROZAC) 40 MG capsule Take 1 capsule (40 mg total) by mouth daily.   gabapentin (NEURONTIN) 100 MG  capsule Take 1 capsule (100 mg total) by mouth at bedtime.   glucose blood test strip Use to test blood sugars daily. Dx: E11.9   ibuprofen (ADVIL,MOTRIN) 200 MG tablet Take 600 mg by mouth as needed for moderate pain. (Patient not taking: Reported on 12/03/2021)   Lancets (ONETOUCH ULTRASOFT) lancets Use to test blood sugars daily. Dx: E11.9   metoprolol tartrate (LOPRESSOR) 25 MG tablet TAKE 1/2 TABLET BY MOUTH TWO TIMES A DAY   pantoprazole (PROTONIX) 20 MG tablet Take 1 tablet (20 mg total) by mouth daily.   tadalafil (CIALIS) 10 MG tablet Take 1 tablet (10 mg total) by mouth daily.   traMADol (ULTRAM) 50 MG tablet Take 2 tablets (100 mg total) by mouth every 12 (twelve) hours as needed. (Patient not taking: Reported on 12/03/2021)   No facility-administered encounter medications on file as of 12/08/2021.    Patient Active Problem List   Diagnosis Date Noted   Erectile dysfunction 10/21/2021   Thoracic Aortic dilatation ([place]) 02/13/2021   OSA (obstructive sleep apnea) 12/09/2020   Sensorineural hearing loss (SNHL) of left ear with restricted hearing of right ear 08/19/2020   Subjective tinnitus, bilateral 08/19/2020   Atrial fibrillation ([place]) 06/10/2020   Degeneration of lumbar intervertebral disc 03/31/2020   Low back pain 03/11/2020   Spinal stenosis of lumbar region 08/29/2019   Type 2 diabetes mellitus with vascular disease ([place]) 10/20/2016   Hypertension associated with diabetes ([place]) 11/20/2015   Coronary artery disease 12/10/2011   Dyslipidemia associated with type 2 diabetes mellitus ([place]) 12/10/2011  Major depression in remission ([place]) 12/10/2011   GERD (gastroesophageal reflux disease) 12/10/2011   Hx of colonic polyps 12/10/2011   Melanoma ([place]) 12/10/2011    Conditions to be addressed/monitored:CHF, HTN, and DMII  Care Plan : Diabetes Type 2 (Adult)  Updates made by Leona Singleton, RN since 12/08/2021 12:00 AM     Problem: Glycemic Management (Diabetes, Type 2)  & Heart Failure   Priority: Medium     Long-Range Goal: Patient will report maintaining Hgb A1C of below 7 in the next 90 days and work with CCM team to better self care manage chronic medical conditions   Start Date: 10/20/2021  Expected End Date: 11/10/2022  Recent Progress: On track  Priority: Medium  Note:   Current Barriers:  Knowledge Deficits related to basic Diabetes pathophysiology and self care/management; Reports monitoring blood sugars about every other day, with fasting ranges of 120-130's, 125 this morning.  Denies any episodes of hypo or hyperglycemia.  Latest Hgb A1C slightly decreased to 6.4.   Knowledge deficits related to basic heart failure pathophysiology and self care management as evidenced by patient not weighing himself daily.   Denies any chest pain,  lower extremity edema or shortness of breath.  Continues to monitor blood pressures ranging (140/80's).  Patient would like to get the shingles vaccine, would like PCP to order to get it from office or pharmacy Case Manager Clinical Goal(s):  patient will demonstrate improved adherence to prescribed treatment plan for diabetes self care/management as evidenced by: every other day monitoring and recording of CBG; adherence to ADA/ carb modified diet; adherence to prescribed medication regimen; contacting provider for new or worsened symptoms or questions Over the next 90 days, patient will weigh self daily and record Over the next 120 days, patient will verbalize understanding of Heart Failure Action Plan and when to call doctor  Diabetes:  (Status: Goal on Track (progressing): YES.) Long Term Goal   Lab Results  Component Value Date   HGBA1C 6.4 (A) 10/21/2021   @ Interventions:  Assessed patient's understanding of A1c goal: <6.5% Provided education to patient about basic DM disease process; Reviewed medications with patient and discussed importance of medication adherence;        Counseled on importance of regular  laboratory monitoring as prescribed;        Discussed plans with patient for ongoing care management follow up and provided patient with direct contact information for care management team;      Provided patient with written educational materials related to hypo and hyperglycemia and importance of correct treatment;       Advised patient, providing education and rationale, to check cbg when you have symptoms of low or high blood sugar and 3 times a week  and record         Collaboration with [name], MD regarding development and update of comprehensive plan of care as evidenced by provider attestation and co-signature Inter-disciplinary care team collaboration (see longitudinal plan of care) Provided patient with verbal education related to hypo and hyperglycemia and importance of correct treatment; and self-awareness of signs/symptoms of hypo or hyperglycemia encouraged Barriers to adherence to treatment plan identified Blood glucose monitoring encouraged; blood glucose readings reviewed; - use of blood glucose monitoring log promoted Mutual A1C goal set or reviewed; Discussed ways to help maintain current A1C Congratulated patient on current A1C (still within goal) Encouraged to attend all scheduled provider appointments Discussed and encouraged to adhere to prescribed ADA/carb modified Encouraged to continue  to increase activity as tolerated, going to gym No immediate recommendations/changes  Heart Failure Interventions:  (Status: Goal on Track (progressing): YES. Condition stable. Not addressed this visit.)  Long Term Goal  Wt Readings from Last 3 Encounters:  11/10/21 247 lb (112 kg)  10/28/21 244 lb (110.7 kg)  10/26/21 241 lb (109.3 kg)  Basic overview and discussion of pathophysiology of Heart Failure reviewed Provided education on low sodium diet Reviewed Heart Failure Action Plan in depth and provided written copy Discussed importance of daily weight and advised patient to  weigh and record daily Discussed the importance of keeping all appointments with provider  Provided verbal education on low sodium heart healthy carbohydrate modified diabetic diet Reviewed signs and symptoms of heart failure and discussed Heart Failure Action Plan  Rediscussed importance of daily weight monitoring and when to call provider based on weight Encouraged patient to view EMMI Heart Failure  and daily weight monitoring educational video  Encouraged to continue to monitor heart rate for increase rate and being out of rhythm  Encouraged continued blood pressure monitoring and notifying provider for sustained elevations Encouraged to keep and attend scheduled medical appointments Congratulated on increase in activity, going to gym and recent weight loss Encouraged continued monitoring of BP reviewing log with Cardiology for medication adjustment Patient Goals/Self-Care Activities: Check blood sugar at least 3 times a week Check blood sugar if I feel it is too high or too low Enter blood sugar readings and medication into daily log Take the blood sugar log to all doctor visits  Contact primary care provider for sustained elevations Call office if I gain more than 2 pounds in one day or 5 pounds in one week Weigh myself daily, at same time each day and record in log Try stick with low salt heart healthy carbohydrate modified diabetic diet Monitor yourself for rapid irregular heart rate, taking prn medication if indicated and notifying provider Take weight log with you to medical appointments Review BP log with Cardiologist Follow Up Plan: The care management team will reach out to the patient again over the next 45 business days.      Plan:The care management team will reach out to the patient again over the next 45 days.  Hubert Azure RN, MSN RN Care Management Coordinator  [place] [phone] [email or web address]

## 2021-12-08 NOTE — Patient Instructions (Addendum)
Visit Information  Thank you for taking time to visit with me today. Please don't hesitate to contact me if I can be of assistance to you before our next scheduled telephone appointment.  Following are the goals we discussed today:  Check blood sugar at least 3 times a week Check blood sugar if I feel it is too high or too low Enter blood sugar readings and medication into daily log Take the blood sugar log to all doctor visits  Contact primary care provider for sustained elevations Call office if I gain more than 2 pounds in one day or 5 pounds in one week Weigh myself daily, at same time each day and record in log Try stick with low salt heart healthy carbohydrate modified diabetic diet Monitor yourself for rapid irregular heart rate, taking prn medication if indicated and notifying provider Take weight log with you to medical appointments Review BP log with Cardiologist  Our next appointment is by telephone on 4/11 at 1400  Please call the care guide team at [phone] if you need to cancel or reschedule your appointment.   If you are experiencing a Mental Health or [place] or need someone to talk to, please call the Suicide and Crisis Lifeline: 988 call the [place] National Suicide Prevention Lifeline: 615-523-7450 or TTY: (586) 182-2410 TTY 203-641-5878) to talk to a trained counselor call 1-800-273-TALK (toll free, 24 hour hotline) go to [facility] [address], [place] [id number]) call 911   Patient verbalizes understanding of instructions and care plan provided today and agrees to view in [place]. Active MyChart status confirmed with patient.    Hubert Azure RN, MSN RN Care Management Coordinator  [facility] [phone] [email or web address]

## 2021-12-22 NOTE — Progress Notes (Signed)
This encounter was created in error - please disregard.

## 2021-12-30 ENCOUNTER — Other Ambulatory Visit: Payer: Self-pay | Admitting: Family Medicine

## 2022-01-05 ENCOUNTER — Other Ambulatory Visit: Payer: Self-pay | Admitting: Family Medicine

## 2022-01-09 ENCOUNTER — Emergency Department (HOSPITAL_COMMUNITY)
Admission: EM | Admit: 2022-01-09 | Discharge: 2022-01-09 | Disposition: A | Discharge status: Home / Self Care | Payer: Medicare HMO | Attending: Emergency Medicine | Admitting: Emergency Medicine

## 2022-01-09 ENCOUNTER — Encounter (HOSPITAL_COMMUNITY): Payer: Self-pay | Admitting: Emergency Medicine

## 2022-01-09 ENCOUNTER — Other Ambulatory Visit: Payer: Self-pay

## 2022-01-09 ENCOUNTER — Emergency Department (HOSPITAL_COMMUNITY): Payer: Medicare HMO

## 2022-01-09 DIAGNOSIS — Z7901 Long term (current) use of anticoagulants: Secondary | ICD-10-CM | POA: Diagnosis not present

## 2022-01-09 DIAGNOSIS — J320 Chronic maxillary sinusitis: Secondary | ICD-10-CM | POA: Diagnosis not present

## 2022-01-09 DIAGNOSIS — D72829 Elevated white blood cell count, unspecified: Secondary | ICD-10-CM | POA: Diagnosis not present

## 2022-01-09 DIAGNOSIS — I6529 Occlusion and stenosis of unspecified carotid artery: Secondary | ICD-10-CM | POA: Diagnosis not present

## 2022-01-09 DIAGNOSIS — I480 Paroxysmal atrial fibrillation: Secondary | ICD-10-CM | POA: Diagnosis not present

## 2022-01-09 DIAGNOSIS — L0201 Cutaneous abscess of face: Secondary | ICD-10-CM | POA: Diagnosis not present

## 2022-01-09 DIAGNOSIS — N3000 Acute cystitis without hematuria: Secondary | ICD-10-CM | POA: Insufficient documentation

## 2022-01-09 DIAGNOSIS — Z20822 Contact with and (suspected) exposure to covid-19: Secondary | ICD-10-CM | POA: Insufficient documentation

## 2022-01-09 DIAGNOSIS — K449 Diaphragmatic hernia without obstruction or gangrene: Secondary | ICD-10-CM | POA: Insufficient documentation

## 2022-01-09 DIAGNOSIS — Z85820 Personal history of malignant melanoma of skin: Secondary | ICD-10-CM | POA: Diagnosis not present

## 2022-01-09 DIAGNOSIS — I4891 Unspecified atrial fibrillation: Secondary | ICD-10-CM | POA: Diagnosis not present

## 2022-01-09 DIAGNOSIS — J322 Chronic ethmoidal sinusitis: Secondary | ICD-10-CM | POA: Diagnosis not present

## 2022-01-09 DIAGNOSIS — R509 Fever, unspecified: Secondary | ICD-10-CM | POA: Diagnosis not present

## 2022-01-09 HISTORY — DX: Unspecified atrial fibrillation: I48.91

## 2022-01-09 LAB — CBC
HCT: 47.7 % (ref 39.0–52.0)
Hemoglobin: 14.6 g/dL (ref 13.0–17.0)
MCH: 27.2 pg (ref 26.0–34.0)
MCHC: 30.6 g/dL (ref 30.0–36.0)
MCV: 88.8 fL (ref 80.0–100.0)
Platelets: 192 10*3/uL (ref 150–400)
RBC: 5.37 MIL/uL (ref 4.22–5.81)
RDW: 14.6 % (ref 11.5–15.5)
WBC: 15.5 10*3/uL — ABNORMAL HIGH (ref 4.0–10.5)
nRBC: 0 % (ref 0.0–0.2)

## 2022-01-09 LAB — COMPREHENSIVE METABOLIC PANEL
ALT: 21 U/L (ref 0–44)
AST: 26 U/L (ref 15–41)
Albumin: 4.1 g/dL (ref 3.5–5.0)
Alkaline Phosphatase: 45 U/L (ref 38–126)
Anion gap: 8 (ref 5–15)
BUN: 12 mg/dL (ref 8–23)
CO2: 25 mmol/L (ref 22–32)
Calcium: 9.1 mg/dL (ref 8.9–10.3)
Chloride: 104 mmol/L (ref 98–111)
Creatinine, Ser: 1.16 mg/dL (ref 0.61–1.24)
GFR, Estimated: >60 mL/min (ref >60)
Glucose, Bld: 130 mg/dL — ABNORMAL HIGH (ref 70–99)
Potassium: 4.2 mmol/L (ref 3.5–5.1)
Sodium: 137 mmol/L (ref 135–145)
Total Bilirubin: 1 mg/dL (ref 0.3–1.2)
Total Protein: 7 g/dL (ref 6.5–8.1)

## 2022-01-09 LAB — LACTIC ACID, PLASMA
Lactic Acid, Venous: 2 mmol/L (ref 0.5–1.9)
Lactic Acid, Venous: 1.9 mmol/L (ref 0.5–1.9)

## 2022-01-09 LAB — TROPONIN I (HIGH SENSITIVITY)
Troponin I (High Sensitivity): 15 ng/L (ref <18)
Troponin I (High Sensitivity): 14 ng/L (ref <18)

## 2022-01-09 LAB — URINALYSIS, ROUTINE W REFLEX MICROSCOPIC
Bilirubin Urine: NEGATIVE
Glucose, UA: NEGATIVE mg/dL
Ketones, ur: NEGATIVE mg/dL
Nitrite: POSITIVE — AB
Protein, ur: NEGATIVE mg/dL
Specific Gravity, Urine: 1.023 (ref 1.005–1.030)
WBC, UA: >50 WBC/hpf — ABNORMAL HIGH (ref 0–5)
pH: 7 (ref 5.0–8.0)

## 2022-01-09 LAB — RESP PANEL BY RT-PCR (FLU A&B, COVID) ARPGX2
Influenza A by PCR: NEGATIVE
Influenza B by PCR: NEGATIVE
SARS Coronavirus 2 by RT PCR: NEGATIVE

## 2022-01-09 MED ORDER — ACETAMINOPHEN 500 MG PO TABS
1000.0000 mg | ORAL_TABLET | Freq: Once | ORAL | Status: AC
  Administered 2022-01-09: 1000 mg via ORAL
  Filled 2022-01-09: qty 2

## 2022-01-09 MED ORDER — SODIUM CHLORIDE 0.9 % IV BOLUS
1000.0000 mL | Freq: Once | INTRAVENOUS | Status: AC
  Administered 2022-01-09: 1000 mL via INTRAVENOUS

## 2022-01-09 MED ORDER — IOHEXOL 300 MG/ML  SOLN
100.0000 mL | Freq: Once | INTRAMUSCULAR | Status: AC | PRN
  Administered 2022-01-09: 100 mL via INTRAVENOUS

## 2022-01-09 MED ORDER — CIPROFLOXACIN HCL 500 MG PO TABS
500.0000 mg | ORAL_TABLET | Freq: Once | ORAL | Status: AC
  Administered 2022-01-09: 500 mg via ORAL
  Filled 2022-01-09: qty 1

## 2022-01-09 MED ORDER — CIPROFLOXACIN HCL 500 MG PO TABS: 500.0000 mg | ORAL_TABLET | Freq: Two times a day (BID) | ORAL | 0 refills | Status: DC

## 2022-01-09 NOTE — ED Provider Notes (Signed)
?[facility] ?Provider Note ? ? ?CSN: [id number] ?Arrival date & time: 01/09/22  1505 ? ?  ? ?History ? ?Chief Complaint  ?Patient presents with  ? Atrial Fibrillation  ? Fever  ? ? ?Brent Taylor is a 81 y.o. male. ? ?Pt is a 81 yo male with a hx of sepsis from an infected tooth, afib on Eliquis, melanoma, hyperlipidemia, dm and depression.  Pt said he had his tooth that was originally infected worked on 2 weeks ago.  He was put on amox for a week which he finished on Wednesday, 3/29.  He started having a bad taste in his mouth on the 30th.  He called his dentist and was told to gargle salt water.  He did and the taste went away.  Today, pt developed a fever of 100 degrees.  He did not take anything for it.  He also felt like he went into afib.  He said that he goes in and out.  He took a home covid test and it was negative.  Pt is worried he is getting septic again. ? ? ?  ? ?Home Medications ?Prior to Admission medications   ?Medication Sig Start Date End Date Taking? Authorizing Provider  ?ciprofloxacin (CIPRO) 500 MG tablet Take 1 tablet (500 mg total) by mouth 2 (two) times daily. 01/09/22  Yes Isla Pence, MD  ?apixaban (ELIQUIS) 5 MG TABS tablet TAKE ONE TABLET BY MOUTH TWICE A DAY 06/08/21   [name], [name], MD  ?Blood Glucose Monitoring Suppl (ONE TOUCH ULTRA 2) w/Device KIT Use daily to check blood sugar. 10/13/20   [name], MD  ?diltiazem (CARDIZEM) 30 MG tablet Take 1 tablet every 4 hours AS NEEDED for afib heart rate >100 as long as top blood pressure >100. ?Patient not taking: Reported on 12/03/2021 06/25/20   Sherran Needs, NP  ?FLUoxetine (PROZAC) 40 MG capsule Take 1 capsule (40 mg total) by mouth daily. 04/15/21   [name], MD  ?gabapentin (NEURONTIN) 100 MG capsule Take 1 capsule (100 mg total) by mouth at bedtime. 03/12/21   [name], MD  ?ibuprofen (ADVIL,MOTRIN) 200 MG tablet Take 600 mg by mouth as needed for moderate pain. ?Patient  not taking: Reported on 12/03/2021    [provider]  ?Lancets [facility] ULTRASOFT) lancets USE TO TEST BLOOD SUGAR DAILY 12/31/21   [name], MD  ?metoprolol tartrate (LOPRESSOR) 25 MG tablet TAKE 1/2 TABLET BY MOUTH TWO TIMES A DAY 10/06/21   [name], [name], MD  ?[facility] ULTRA test strip USE TO TEST BLOOD SUGAR DAILY AS DIRECTED 12/31/21   [name], MD  ?pantoprazole (PROTONIX) 20 MG tablet Take 1 tablet (20 mg total) by mouth daily. 04/15/21   [name], MD  ?pravastatin (PRAVACHOL) 80 MG tablet Take 1 tablet (80 mg total) by mouth every evening. 11/10/21   [name], [name], MD  ?quinapril (ACCUPRIL) 40 MG tablet Take 1 tablet (40 mg total) by mouth at bedtime. 11/27/21   Werner Lean, MD  ?tadalafil (CIALIS) 10 MG tablet Take 1 tablet (10 mg total) by mouth daily. 10/21/21   [name], MD  ?traMADol Veatrice Bourbon) 50 MG tablet Take 2 tablets (100 mg total) by mouth every 12 (twelve) hours as needed. ?Patient not taking: Reported on 12/03/2021 09/18/21   [name], MD  ?   ? ?Allergies    ?Lipitor [atorvastatin calcium]   ? ?Review of Systems   ?  Review of Systems  ?Constitutional:  Positive for fatigue and fever.  ?Cardiovascular:  Positive for palpitations.  ?Neurological:  Positive for weakness.  ?All other systems reviewed and are negative. ? ?Physical Exam ?Updated Vital Signs ?BP 127/79   Pulse 92   Temp 99 ?F (37.2 ?C) (Oral)   Resp 15   SpO2 95%  ?Physical Exam ?Vitals and nursing note reviewed.  ?Constitutional:   ?   Appearance: Normal appearance.  ?HENT:  ?   Head: Normocephalic and atraumatic.  ?   Comments: Packing in tooth bed in left lower mouth.  No swelling noted. ?   Right Ear: External ear normal.  ?   Left Ear: External ear normal.  ?   Nose: Nose normal.  ?   Mouth/Throat:  ?   Mouth: Mucous membranes are dry.  ?Eyes:  ?   Extraocular Movements: Extraocular movements intact.  ?   Conjunctiva/sclera: Conjunctivae normal.  ?   Pupils:  Pupils are equal, round, and reactive to light.  ?Cardiovascular:  ?   Rate and Rhythm: Normal rate. Rhythm irregular.  ?Pulmonary:  ?   Effort: Pulmonary effort is normal.  ?   Breath sounds: Normal breath sounds.  ?Abdominal:  ?   General: Abdomen is flat.  ?Musculoskeletal:     ?   General: Normal range of motion.  ?   Cervical back: Normal range of motion and neck supple.  ?Skin: ?   General: Skin is warm.  ?   Capillary Refill: Capillary refill takes less than 2 seconds.  ?Neurological:  ?   General: No focal deficit present.  ?   Mental Status: He is alert and oriented to person, place, and time.  ?Psychiatric:     ?   Mood and Affect: Mood normal.     ?   Behavior: Behavior normal.     ?   Thought Content: Thought content normal.     ?   Judgment: Judgment normal.  ? ? ?ED Results / Procedures / Treatments   ?Labs ?(all labs ordered are listed, but only abnormal results are displayed) ?Labs Reviewed  ?CBC - Abnormal; Notable for the following components:  ?    Result Value  ? WBC 15.5 (*)   ? All other components within normal limits  ?LACTIC ACID, PLASMA - Abnormal; Notable for the following components:  ? Lactic Acid, Venous 2.0 (*)   ? All other components within normal limits  ?COMPREHENSIVE METABOLIC PANEL - Abnormal; Notable for the following components:  ? Glucose, Bld 130 (*)   ? All other components within normal limits  ?URINALYSIS, ROUTINE W REFLEX MICROSCOPIC - Abnormal; Notable for the following components:  ? Color, Urine STRAW (*)   ? Hgb urine dipstick SMALL (*)   ? Nitrite POSITIVE (*)   ? Leukocytes,Ua LARGE (*)   ? WBC, UA >50 (*)   ? Bacteria, UA RARE (*)   ? All other components within normal limits  ?RESP PANEL BY RT-PCR (FLU A&B, COVID) [id number]  ?CULTURE, BLOOD (ROUTINE X 2)  ?CULTURE, BLOOD (ROUTINE X 2)  ?URINE CULTURE  ?LACTIC ACID, PLASMA  ?TROPONIN I (HIGH SENSITIVITY)  ?TROPONIN I (HIGH SENSITIVITY)  ? ? ?EKG ?EKG Interpretation ? ?Date/Time:  Saturday January 09 2022 18:03:26  EDT ?Ventricular Rate:  110 ?PR Interval:    ?QRS Duration: 113 ?QT Interval:  345 ?QTC Calculation: 467 ?R Axis:   -79 ?Text Interpretation: Atrial fibrillation Ventricular premature complex Abnormal R-wave progression, early transition LVH  with IVCD, LAD and secondary repol abnrm now in afib Confirmed by Isla Pence [phone]) on 01/09/2022 6:35:56 PM ? ?Radiology ?DG Chest 2 View ? ?Result Date: 01/09/2022 ?CLINICAL DATA:  Atrial fibrillation.  Fever for 1 day. EXAM: CHEST - 2 VIEW COMPARISON:  05/07/2021 FINDINGS: Stable cardiomediastinal contours. Large hiatal hernia is unchanged from previous exam. No pleural effusion or edema. No airspace opacity. IMPRESSION: No active cardiopulmonary abnormalities. Hiatal hernia. Electronically Signed   By: Kerby Moors M.D.   On: 01/09/2022 16:48  ? ?CT Maxillofacial W Contrast ? ?Result Date: 01/09/2022 ?CLINICAL DATA:  Initial evaluation for acute maxillofacial abscess. EXAM: CT MAXILLOFACIAL WITH CONTRAST TECHNIQUE: Multidetector CT imaging of the maxillofacial structures was performed with intravenous contrast. Multiplanar CT image reconstructions were also generated. RADIATION DOSE REDUCTION: This exam was performed according to the departmental dose-optimization program which includes automated exposure control, adjustment of the mA and/or kV according to patient size and/or use of iterative reconstruction technique. CONTRAST:  167mL OMNIPAQUE IOHEXOL 300 MG/ML  SOLN COMPARISON:  None available. FINDINGS: Osseous: No acute osseous finding. No discrete or worrisome osseous lesions. Orbits: Globes and orbital soft tissues within normal limits. Prior bilateral ocular lens replacement. Sinuses: Mild mucoperiosteal thickening present about the ethmoidal air cells and maxillary sinuses. Mastoid air cells and middle ear cavities are clear. Soft tissues: No significant soft tissue swelling seen about the face by CT. No visible significant inflammatory changes about the  dentition. Visualized parotid and submandibular glands within normal limits. No adenopathy within the upper neck. Visualized oral cavity, oropharynx and nasopharynx within normal limits. No retropharyngeal swelling. Visual

## 2022-01-09 NOTE — ED Triage Notes (Signed)
Pt woke up this morning in Afib. Reports history of sepsis from infected tooth 5 years ago.  Pt went to dentist 1 week ago and had infected L lower tooth.  Took antibiotic for 7 days.  Reports dizziness, nausea, and temp of 100 today.  Also reports SOB and intermittent chest pain for the last few days.  Negative home COVID test today. ?

## 2022-01-10 ENCOUNTER — Encounter: Payer: Self-pay | Admitting: Family Medicine

## 2022-01-11 ENCOUNTER — Encounter: Payer: Self-pay | Admitting: Family Medicine

## 2022-01-11 NOTE — Telephone Encounter (Signed)
Ok to switch to losartan '50mg'$  once daily. ? ?Algis Greenhouse. [name], MD ?01/11/2022 3:34 PM  ? ?

## 2022-01-11 NOTE — Telephone Encounter (Signed)
Please advise  ?[facility]  and [facility] are out of stock  ?

## 2022-01-11 NOTE — Telephone Encounter (Signed)
Spoke with patient, patient will call other pharmacy to see if Rx can be transfer  ?

## 2022-01-12 ENCOUNTER — Other Ambulatory Visit: Payer: Self-pay | Admitting: *Deleted

## 2022-01-12 LAB — URINE CULTURE: Culture: >=100000 — AB

## 2022-01-12 MED ORDER — LOSARTAN POTASSIUM 50 MG PO TABS: 50.0000 mg | ORAL_TABLET | Freq: Every day | ORAL | 0 refills | Status: DC

## 2022-01-13 ENCOUNTER — Telehealth: Payer: Self-pay | Admitting: *Deleted

## 2022-01-13 NOTE — Telephone Encounter (Signed)
Post ED Visit - Positive Culture Follow-up ? ?Culture report reviewed by antimicrobial stewardship pharmacist: ?[place] Team ?'[]'$  Elenor Quinones, Pharm.D. ?'[]'$  Heide Guile, Pharm.D., BCPS AQ-ID ?'[]'$  Parks Neptune, Pharm.D., BCPS ?'[]'$  Alycia Rossetti, Pharm.D., BCPS ?'[]'$  [place], Pharm.D., BCPS, AAHIVP ?'[]'$  Legrand Como, Pharm.D., BCPS, AAHIVP ?'[]'$  Salome Arnt, PharmD, BCPS ?'[]'$  Johnnette Gourd, PharmD, BCPS ?'[]'$  Hughes Better, PharmD, BCPS ?'[]'$  Leeroy Cha, PharmD ?'[]'$  Laqueta Linden, PharmD, BCPS ?'[]'$  Albertina Parr, PharmD ? ?[place] Team ?'[]'$  Leodis Sias, PharmD ?'[]'$  Lindell Spar, PharmD ?'[]'$  Royetta Asal, PharmD ?'[]'$  Graylin Shiver, Rph ?'[]'$  Rema Fendt) Glennon Mac, PharmD ?'[]'$  Arlyn Dunning, PharmD ?'[]'$  Netta Cedars, PharmD ?'[]'$  Dia Sitter, PharmD ?'[]'$  Leone Haven, PharmD ?'[]'$  Gretta Arab, PharmD ?'[]'$  Theodis Shove, PharmD ?'[]'$  Peggyann Juba, PharmD ?'[]'$  Reuel Boom, PharmD ? ? ?Positive urine culture ?Treated with Ciprofloxacin HCL, organism sensitive to the same and no further patient follow-up is required at this time.  Sudie Grumbling, PharmD ? ?Ardeen Fillers ?01/13/2022, 9:47 AM ?  ?

## 2022-01-14 LAB — CULTURE, BLOOD (ROUTINE X 2)
Culture: NO GROWTH
Culture: NO GROWTH
Special Requests: ADEQUATE

## 2022-01-19 ENCOUNTER — Ambulatory Visit (INDEPENDENT_AMBULATORY_CARE_PROVIDER_SITE_OTHER): Payer: Medicare HMO | Admitting: *Deleted

## 2022-01-19 DIAGNOSIS — G4733 Obstructive sleep apnea (adult) (pediatric): Secondary | ICD-10-CM | POA: Diagnosis not present

## 2022-01-19 DIAGNOSIS — E1159 Type 2 diabetes mellitus with other circulatory complications: Secondary | ICD-10-CM

## 2022-01-19 DIAGNOSIS — I10 Essential (primary) hypertension: Secondary | ICD-10-CM

## 2022-01-19 NOTE — Chronic Care Management (AMB) (Signed)
?Chronic Care Management  ? ?CCM RN Visit Note ? ?01/19/2022 ?Name: Brent Taylor MRN: [id number] DOB: 1941-05-25 ? ?Subjective: ?Brent Taylor is a 81 y.o. year old male who is a primary care patient of [name], Algis Greenhouse, MD. The care management team was consulted for assistance with disease management and care coordination needs.   ? ?Engaged with patient by telephone for follow up visit in response to provider referral for case management and/or care coordination services.  ? ?Consent to Services:  ?The patient was given information about Chronic Care Management services, agreed to services, and gave verbal consent prior to initiation of services.  Please see initial visit note for detailed documentation.  ? ?Patient agreed to services and verbal consent obtained.  ? ?Assessment: Review of patient past medical history, allergies, medications, health status, including review of consultants reports, laboratory and other test data, was performed as part of comprehensive evaluation and provision of chronic care management services.  ? ?SDOH (Social Determinants of Health) assessments and interventions performed:   ? ?CCM Care Plan ? ?Allergies  ?Allergen Reactions  ? Lipitor [Atorvastatin Calcium]   ?  Muscle ache  ? ? ?Outpatient Encounter Medications as of 01/19/2022  ?Medication Sig  ? losartan (COZAAR) 50 MG tablet Take 1 tablet (50 mg total) by mouth daily.  ? apixaban (ELIQUIS) 5 MG TABS tablet TAKE ONE TABLET BY MOUTH TWICE A DAY  ? Blood Glucose Monitoring Suppl (ONE TOUCH ULTRA 2) w/Device KIT Use daily to check blood sugar.  ? ciprofloxacin (CIPRO) 500 MG tablet Take 1 tablet (500 mg total) by mouth 2 (two) times daily.  ? diltiazem (CARDIZEM) 30 MG tablet Take 1 tablet every 4 hours AS NEEDED for afib heart rate >100 as long as top blood pressure >100. (Patient not taking: Reported on 12/03/2021)  ? FLUoxetine (PROZAC) 40 MG capsule Take 1 capsule (40 mg total) by mouth daily.  ? gabapentin (NEURONTIN) 100 MG capsule  Take 1 capsule (100 mg total) by mouth at bedtime.  ? ibuprofen (ADVIL,MOTRIN) 200 MG tablet Take 600 mg by mouth as needed for moderate pain. (Patient not taking: Reported on 12/03/2021)  ? Lancets (ONETOUCH ULTRASOFT) lancets USE TO TEST BLOOD SUGAR DAILY  ? metoprolol tartrate (LOPRESSOR) 25 MG tablet TAKE 1/2 TABLET BY MOUTH TWO TIMES A DAY  ? ONETOUCH ULTRA test strip USE TO TEST BLOOD SUGAR DAILY AS DIRECTED  ? pantoprazole (PROTONIX) 20 MG tablet Take 1 tablet (20 mg total) by mouth daily.  ? pravastatin (PRAVACHOL) 80 MG tablet Take 1 tablet (80 mg total) by mouth every evening.  ? tadalafil (CIALIS) 10 MG tablet Take 1 tablet (10 mg total) by mouth daily.  ? traMADol (ULTRAM) 50 MG tablet Take 2 tablets (100 mg total) by mouth every 12 (twelve) hours as needed. (Patient not taking: Reported on 12/03/2021)  ? ?No facility-administered encounter medications on file as of 01/19/2022.  ? ? ?Patient Active Problem List  ? Diagnosis Date Noted  ? Erectile dysfunction 10/21/2021  ? Thoracic Aortic dilatation ([place]) 02/13/2021  ? OSA (obstructive sleep apnea) 12/09/2020  ? Sensorineural hearing loss (SNHL) of left ear with restricted hearing of right ear 08/19/2020  ? Subjective tinnitus, bilateral 08/19/2020  ? Atrial fibrillation ([place]) 06/10/2020  ? Degeneration of lumbar intervertebral disc 03/31/2020  ? Low back pain 03/11/2020  ? Spinal stenosis of lumbar region 08/29/2019  ? Type 2 diabetes mellitus with vascular disease ([place]) 10/20/2016  ? Hypertension associated with diabetes ([place]) 11/20/2015  ? Coronary  artery disease 12/10/2011  ? Dyslipidemia associated with type 2 diabetes mellitus ([place]) 12/10/2011  ? Major depression in remission ([place]) 12/10/2011  ? GERD (gastroesophageal reflux disease) 12/10/2011  ? Hx of colonic polyps 12/10/2011  ? Melanoma ([place]) 12/10/2011  ? ? ?Conditions to be addressed/monitored:CHF, HTN, and DMII ? ?Care Plan : Diabetes Type 2 (Adult)  ?Updates made by Leona Singleton, RN since  01/19/2022 12:00 AM  ?  ? ?Problem: Glycemic Management (Diabetes, Type 2) & Heart Failure   ?Priority: Medium  ?  ? ?Long-Range Goal: Patient will report maintaining Hgb A1C of below 7 in the next 90 days and work with CCM team to better self care manage chronic medical conditions   ?Start Date: 10/20/2021  ?Expected End Date: 11/10/2022  ?Recent Progress: On track  ?Priority: Medium  ?Note:   ?Current Barriers:  ?Knowledge Deficits related to basic Diabetes pathophysiology and self care/management; Reports monitoring blood sugars about every other day, with fasting ranges of 110-120's, 125 this morning.  Denies any episodes of hypo or hyperglycemia.  Latest Hgb A1C slightly decreased to 6.4.   ?Knowledge deficits related to basic heart failure pathophysiology and self care management as evidenced by patient not weighing himself daily.   Denies any chest pain,  lower extremity edema or shortness of breath.  Continues to blood pressures ranging (150/83).  Does report ER visit for fever fatigue, irregular heart beat; treated for UTI.  Completed antibiotics this past Sunday.  States he feels much better and has follow up scheduled with PCP. ? ?Case Manager Clinical Goal(s):  ?patient will demonstrate improved adherence to prescribed treatment plan for diabetes self care/management as evidenced by: every other day monitoring and recording of CBG; adherence to ADA/ carb modified diet; adherence to prescribed medication regimen; contacting provider for new or worsened symptoms or questions ?Over the next 90 days, patient will weigh self daily and record ?Over the next 120 days, patient will verbalize understanding of Heart Failure Action Plan and when to call doctor ? ?Diabetes:  (Status: Goal on Track (progressing): YES.) Long Term Goal  ? ?Lab Results  ?Component Value Date  ? HGBA1C 6.4 (A) 10/21/2021  ? @ ?Interventions:  ?Assessed patient's understanding of A1c goal: <6.5% ?Provided education to patient about basic DM  disease process; ?Reviewed medications with patient and discussed importance of medication adherence;        ?Counseled on importance of regular laboratory monitoring as prescribed;        ?Discussed plans with patient for ongoing care management follow up and provided patient with direct contact information for care management team;      ?Provided patient with written educational materials related to hypo and hyperglycemia and importance of correct treatment;       ?Advised patient, providing education and rationale, to check cbg when you have symptoms of low or high blood sugar and 3 times a week  and record         ?Collaboration with [name], MD regarding development and update of comprehensive plan of care as evidenced by provider attestation and co-signature ?Inter-disciplinary care team collaboration (see longitudinal plan of care) ?Provided patient with verbal education related to hypo and hyperglycemia and importance of correct treatment; and self-awareness of signs/symptoms of hypo or hyperglycemia encouraged ?Barriers to adherence to treatment plan identified ?Blood glucose monitoring encouraged; blood glucose readings reviewed; - use of blood glucose monitoring log promoted ?Mutual A1C goal set or reviewed; Discussed ways to help maintain current  A1C ?Congratulated patient on current A1C (still within goal) ?Encouraged to attend all scheduled provider appointments ?Discussed and encouraged to adhere to prescribed ADA/carb modified ?Encouraged to continue to increase activity as tolerated, going to gym ?No immediate recommendations/changes ? ?Heart Failure Interventions:  (Status: Goal on Track (progressing): YES. Condition stable. Not addressed this visit.)  Long Term Goal  ?Wt Readings from Last 3 Encounters:  ?11/10/21 247 lb (112 kg)  ?10/28/21 244 lb (110.7 kg)  ?10/26/21 241 lb (109.3 kg)  ?Basic overview and discussion of pathophysiology of Heart Failure reviewed ?Provided education on low  sodium diet ?Reviewed Heart Failure Action Plan in depth and provided written copy ?Discussed importance of daily weight and advised patient to weigh and record daily ?Discussed the importance of kee

## 2022-01-19 NOTE — Patient Instructions (Addendum)
Visit Information ? ?Thank you for taking time to visit with me today. Please don't hesitate to contact me if I can be of assistance to you before our next scheduled telephone appointment. ? ?Following are the goals we discussed today:  ?Check blood sugar at least 3 times a week ?Check blood sugar if I feel it is too high or too low ?Enter blood sugar readings and medication into daily log ?Take the blood sugar log to all doctor visits  ?Contact primary care provider for sustained elevations ?Call office if I gain more than 2 pounds in one day or 5 pounds in one week ?Weigh myself daily, at same time each day and record in log ?Try stick with low salt heart healthy carbohydrate modified diabetic diet ?Monitor yourself for rapid irregular heart rate, taking prn medication if indicated and notifying provider ?Take weight log with you to medical appointments ?Review BP log with Cardiologist ? ?Our next appointment is by telephone on 5/23 at 1000 ? ?Please call the care guide team at [phone] if you need to cancel or reschedule your appointment.  ? ?If you are experiencing a Mental Health or [place] or need someone to talk to, please call the Suicide and Crisis Lifeline: 988 ?call the [place] National Suicide Prevention Lifeline: 575-653-5041 or TTY: 610 780 6437 TTY 6040682637) to talk to a trained counselor ?call 1-800-273-TALK (toll free, 24 hour hotline) ?call 911  ? ?Patient verbalizes understanding of instructions and care plan provided today and agrees to view in [place]. Active MyChart status confirmed with patient.   ? ?Hubert Azure RN, MSN ?RN Care Management Coordinator  ?[place] ?[phone] ?[email or web address] ? ?

## 2022-01-25 ENCOUNTER — Other Ambulatory Visit: Payer: Medicare HMO

## 2022-01-25 DIAGNOSIS — I2510 Atherosclerotic heart disease of native coronary artery without angina pectoris: Secondary | ICD-10-CM | POA: Diagnosis not present

## 2022-01-25 DIAGNOSIS — I480 Paroxysmal atrial fibrillation: Secondary | ICD-10-CM | POA: Diagnosis not present

## 2022-01-25 LAB — LIPID PANEL
Chol/HDL Ratio: 3.7 ratio (ref 0.0–5.0)
Cholesterol, Total: 123 mg/dL (ref 100–199)
HDL: 33 mg/dL — ABNORMAL LOW (ref >39)
LDL Chol Calc (NIH): 68 mg/dL (ref 0–99)
Triglycerides: 121 mg/dL (ref 0–149)
VLDL Cholesterol Cal: 22 mg/dL (ref 5–40)

## 2022-01-25 LAB — ALT: ALT: 17 IU/L (ref 0–44)

## 2022-01-26 ENCOUNTER — Ambulatory Visit (INDEPENDENT_AMBULATORY_CARE_PROVIDER_SITE_OTHER): Payer: Medicare HMO | Admitting: Family Medicine

## 2022-01-26 ENCOUNTER — Encounter: Payer: Self-pay | Admitting: Family Medicine

## 2022-01-26 VITALS — BP 128/80 | HR 51 | Temp 97.7°F | Ht 72.0 in | Wt 250.2 lb

## 2022-01-26 DIAGNOSIS — E1159 Type 2 diabetes mellitus with other circulatory complications: Secondary | ICD-10-CM

## 2022-01-26 DIAGNOSIS — N390 Urinary tract infection, site not specified: Secondary | ICD-10-CM

## 2022-01-26 DIAGNOSIS — R202 Paresthesia of skin: Secondary | ICD-10-CM | POA: Diagnosis not present

## 2022-01-26 DIAGNOSIS — M25512 Pain in left shoulder: Secondary | ICD-10-CM | POA: Insufficient documentation

## 2022-01-26 DIAGNOSIS — M48061 Spinal stenosis, lumbar region without neurogenic claudication: Secondary | ICD-10-CM

## 2022-01-26 LAB — VITAMIN B12: Vitamin B-12: 232 pg/mL (ref 211–911)

## 2022-01-26 MED ORDER — METHYLPREDNISOLONE ACETATE 80 MG/ML IJ SUSP
80.0000 mg | Freq: Once | INTRAMUSCULAR | Status: AC
  Administered 2022-01-26: 80 mg via INTRA_ARTICULAR

## 2022-01-26 MED ORDER — ZOSTER VAC RECOMB ADJUVANTED 50 MCG/0.5ML IM SUSR: 0.5000 mL | Freq: Once | INTRAMUSCULAR | 1 refills | Status: AC

## 2022-01-26 NOTE — Assessment & Plan Note (Signed)
A1c has been well controlled off medications.  Doubt this is contributing to paresthesias. ?

## 2022-01-26 NOTE — Patient Instructions (Signed)
It was very nice to see you today! ? ?We will recheck your urine sample. ? ?We will check a B12 level to see if this is causing your numbness.  Please try propping her feet up at night to see if this helps.  We could increase the gabapentin if you are still having issues. ? ?I am concerned that you may be having a frozen shoulder set up.  We did a cortisone injection today.  Please work on the exercises.  Let me know if not improving. ? ?Take care, ?Dr [name] ? ?PLEASE NOTE: ? ?If you had any lab tests please let us know if you have not heard back within a few days. You may see your results on mychart before we have a chance to review them but we will give you a call once they are reviewed by [place]. If we ordered any referrals today, please let us know if you have not heard from their office within the next week.  ? ?Please try these tips to maintain a healthy lifestyle: ? ?Eat at least 3 REAL meals and 1-2 snacks per day.  Aim for no more than 5 hours between eating.  If you eat breakfast, please do so within one hour of getting up.  ? ?Each meal should contain half fruits/vegetables, one quarter protein, and one quarter carbs (no bigger than a computer mouse) ? ?Cut down on sweet beverages. This includes juice, soda, and sweet tea.  ? ?Drink at least 1 glass of water with each meal and aim for at least 8 glasses per day ? ?Exercise at least 150 minutes every week.   ?

## 2022-01-26 NOTE — Progress Notes (Signed)
? ?Brent Taylor is a 81 y.o. male who presents today for an office visit. ? ?Assessment/Plan:  ?New/Acute Problems: ?UTI ?We will check urine culture to ensure clearance. ? ?Paresthesias ?Likely multifactorial.  His A1c's have always been well controlled doubt this is significantly contributing.  He has had borderline low B12 in the past which could be contributing.  Most likely explanation is his known history of spinal stenosis.  We will check B12 today to further evaluate.  Recommended repositioning while sleeping to see if this could help.  We discussed changing dose of gabapentin however he deferred for now. ? ?Chronic Problems Addressed Today: ?Left shoulder pain ?Consistent with rotator cuff tendinopathy.  May have early frozen shoulder as well.  We discussed treatment options.  He elected proceed with steroid injection today.  He tolerated well.  See below procedure note.  Discussed home exercises and handout was given.  Would consider referral to PT or sports medicine if still not improving. ? ?Spinal stenosis of lumbar region ?See above.  He is having more paresthesias.  We will be rechecking B12.  Can consider increasing dose of gabapentin if needed.  ? ?Type 2 diabetes mellitus with vascular disease ([place]) ?A1c has been well controlled off medications.  Doubt this is contributing to paresthesias. ? ? ?  ?Subjective:  ?HPI: ? ?Patient here to ED follow-up.  He presented to the ED on 01/09/2022 with fever. Had fever of 100 degrees. Had urine culture which showed UTI. He was found to have fever due to UTI. He was started on Cipro. He has finished his Cipro course. His symptoms has resolved. Today, he would like to recheck his urine culture.  ? ?He complain of bilateral feet numbness. This has been going on for a while. He notes he feel numbness in his feet when laying down in bed at night. He also noticed numbness when sitting down. His sugar has been well controlled. Usually in the 120's at home. He had  blood work done couple of months ago. Had low vitamin B 12 which could be contributing.  Denies pins and needle sensation. Denies injury or fall.  ? ?He also complain of shoulder pain. Symptoms started about 2 weeks ago. Located on left shoulder. Worse with certain motion. He has noticed pain when lifting his arm. He notes his shoulder has been hurting all the time. He has noticed some pain when rasing his arm to shoulder. He would like to try cortisone shot for his pain. Denies any injury. No precipitating events or aggravating factors. Denies any fall.  ? ?   ?  ?Objective:  ?Physical Exam: ?BP 128/80   Pulse (!) 51   Temp 97.7 ?F (36.5 ?C) (Temporal)   Ht 6' (1.829 m)   Wt 250 lb 3.2 oz (113.5 kg)   SpO2 97%   BMI 33.93 kg/m?   ?Gen: No acute distress, resting comfortably ?CV: Regular rate and rhythm with no murmurs appreciated ?Pulm: Normal work of breathing, clear to auscultation bilaterally with no crackles, wheezes, or rhonchi ?MSK: ?- Left Shoulder: No deformities.  Decreased range of motion secondary to pain.  Pain elicited with resisted supraspinatus.  Pain also elicited with resisted internal/external rotation. ?Neuro: Grossly normal, moves all extremities ?Psych: Normal affect and thought content ? ?Shoulder Injection Procedure Note ? ?Pre-operative Diagnosis: Rotator cuff tendinopathy ? ?Post-operative Diagnosis: same ? ?Indications: Pain Relief  ? ?Anesthesia: Topical ethyl chloride ? ?Procedure Details  ? ?Written consent was obtained for the procedure. The shoulder  was prepped with iodine and the skin was anesthetized with topical ethyl choride. Using a 25 gauge needle the subacromial space was injected with 3 mL 1% lidocaine and 1 mL of 80cc/ml depomedrol under the posterior aspect of the acromion. The injection site was cleansed with topical isopropyl alcohol and a dressing was applied. ? ?Complications:  None; patient tolerated the procedure well. ? ?Dimas Chyle, MD ?01/26/2022 9:55 AM  ? ?    ? ? ?I,Savera Zaman,acting as a scribe for Dimas Chyle, MD.,have documented all relevant documentation on the behalf of Dimas Chyle, MD,as directed by  Dimas Chyle, MD while in the presence of Dimas Chyle, MD.  ? ?I, Dimas Chyle, MD, have reviewed all documentation for this visit. The documentation on 01/26/22 for the exam, diagnosis, procedures, and orders are all accurate and complete. ? ?Algis Greenhouse. [name], MD ?01/26/2022 9:55 AM  ? ?

## 2022-01-26 NOTE — Assessment & Plan Note (Signed)
Consistent with rotator cuff tendinopathy.  May have early frozen shoulder as well.  We discussed treatment options.  He elected proceed with steroid injection today.  He tolerated well.  See below procedure note.  Discussed home exercises and handout was given.  Would consider referral to PT or sports medicine if still not improving. ?

## 2022-01-26 NOTE — Assessment & Plan Note (Signed)
See above.  He is having more paresthesias.  We will be rechecking B12.  Can consider increasing dose of gabapentin if needed.  ?

## 2022-01-27 LAB — URINE CULTURE
MICRO NUMBER:: 13278370
SPECIMEN QUALITY:: ADEQUATE

## 2022-01-28 ENCOUNTER — Encounter: Payer: Self-pay | Admitting: Family Medicine

## 2022-01-28 ENCOUNTER — Other Ambulatory Visit: Payer: Self-pay

## 2022-01-28 NOTE — Progress Notes (Signed)
Please inform patient of the following: ? ?His B12 is on the low side of normal.  This could be contributing to some of his symptoms.  We can start the B12 protocol here or he can start taking 1000 mcg daily.  We can recheck in 3 to 6 months. ? ?His urine culture was negative for infection.

## 2022-02-02 ENCOUNTER — Ambulatory Visit (INDEPENDENT_AMBULATORY_CARE_PROVIDER_SITE_OTHER): Payer: Medicare HMO | Admitting: *Deleted

## 2022-02-02 DIAGNOSIS — E538 Deficiency of other specified B group vitamins: Secondary | ICD-10-CM

## 2022-02-02 MED ORDER — CYANOCOBALAMIN 1000 MCG/ML IJ SOLN
1000.0000 ug | Freq: Once | INTRAMUSCULAR | Status: AC
  Administered 2022-02-02: 1000 ug via INTRAMUSCULAR

## 2022-02-02 NOTE — Progress Notes (Signed)
I have reviewed the patient's encounter and agree with the documentation. ? ?Brent Taylor. [name], MD ?02/02/2022 10:29 AM  ? ?

## 2022-02-02 NOTE — Progress Notes (Signed)
Per orders of Dr. [name], injection of first weekly B 12 given in left deltoid per patient preference by Zacarias Pontes, CMA. Patient tolerated injection well.  ?

## 2022-02-07 DIAGNOSIS — E1159 Type 2 diabetes mellitus with other circulatory complications: Secondary | ICD-10-CM

## 2022-02-07 DIAGNOSIS — I509 Heart failure, unspecified: Secondary | ICD-10-CM | POA: Diagnosis not present

## 2022-02-09 ENCOUNTER — Other Ambulatory Visit: Payer: Self-pay | Admitting: *Deleted

## 2022-02-09 ENCOUNTER — Ambulatory Visit (INDEPENDENT_AMBULATORY_CARE_PROVIDER_SITE_OTHER): Payer: Medicare HMO | Admitting: *Deleted

## 2022-02-09 ENCOUNTER — Encounter: Payer: Self-pay | Admitting: Family Medicine

## 2022-02-09 DIAGNOSIS — E538 Deficiency of other specified B group vitamins: Secondary | ICD-10-CM

## 2022-02-09 DIAGNOSIS — R351 Nocturia: Secondary | ICD-10-CM | POA: Diagnosis not present

## 2022-02-09 LAB — POCT URINALYSIS DIPSTICK
Bilirubin, UA: NEGATIVE
Blood, UA: POSITIVE
Glucose, UA: NEGATIVE
Ketones, UA: NEGATIVE
Nitrite, UA: POSITIVE
Protein, UA: POSITIVE — AB
Spec Grav, UA: 1.015 (ref 1.010–1.025)
Urobilinogen, UA: 0.2 E.U./dL
pH, UA: 6.5 (ref 5.0–8.0)

## 2022-02-09 MED ORDER — CYANOCOBALAMIN 1000 MCG/ML IJ SOLN
1000.0000 ug | Freq: Once | INTRAMUSCULAR | Status: AC
  Administered 2022-02-09: 1000 ug via INTRAMUSCULAR

## 2022-02-09 MED ORDER — NITROFURANTOIN MONOHYD MACRO 100 MG PO CAPS: 100.0000 mg | ORAL_CAPSULE | Freq: Two times a day (BID) | ORAL | 0 refills | Status: DC

## 2022-02-09 NOTE — Progress Notes (Signed)
Per orders of Dr. [name], injection of B 12 given in left deltoid by Zacarias Pontes, CMA. Patient tolerated injection well.  ?

## 2022-02-09 NOTE — Progress Notes (Signed)
Please inform patient of the following: ? ?His UA shows probably UTI. Please send in macrobid '100mg'$  bid x 7 days while we are waiting on his urine culture to come back. ? ?Algis Greenhouse. [name], MD ?02/09/2022 10:44 AM  ?

## 2022-02-09 NOTE — Telephone Encounter (Signed)
Spoke with patient and advised lab results and Macrobid was sent to pharmacy. Pt will be back from [place] first of next week. If symptoms don't get any better with medication he states will make an appointment when he comes back into town.  ?

## 2022-02-09 NOTE — Telephone Encounter (Signed)
Please see result note for UA. Please make sure he is not having any signs of a kidney infection such as fevers, chills, nausea, vomiting, etc. He needs an office visit for follow up later this week or next week. ? ?Algis Greenhouse. [name], MD ?02/09/2022 10:45 AM  ? ?

## 2022-02-09 NOTE — Progress Notes (Signed)
I have reviewed the patient's encounter and agree with the documentation. ? ?Algis Greenhouse. [name], MD ?02/09/2022 10:30 AM  ? ?

## 2022-02-09 NOTE — Telephone Encounter (Signed)
Please advise 

## 2022-02-11 ENCOUNTER — Encounter: Payer: Self-pay | Admitting: Family Medicine

## 2022-02-11 LAB — URINE CULTURE
MICRO NUMBER:: 13339738
SPECIMEN QUALITY:: ADEQUATE

## 2022-02-12 NOTE — Telephone Encounter (Signed)
Called pt and he was wanting to know the results of Urine culture and advised. Pt verbalized understanding  ?

## 2022-02-12 NOTE — Progress Notes (Signed)
Please inform patient of the following: ? ?Urine culture confirms UTI. The antibiotic we have him on should treat this. Would like for him to let us know if his symptoms are not improving. ? ?Algis Greenhouse. [name], MD ?02/12/2022 7:59 AM  ?

## 2022-02-18 ENCOUNTER — Ambulatory Visit (INDEPENDENT_AMBULATORY_CARE_PROVIDER_SITE_OTHER): Payer: Medicare HMO | Admitting: *Deleted

## 2022-02-18 DIAGNOSIS — E538 Deficiency of other specified B group vitamins: Secondary | ICD-10-CM

## 2022-02-18 DIAGNOSIS — G4733 Obstructive sleep apnea (adult) (pediatric): Secondary | ICD-10-CM | POA: Diagnosis not present

## 2022-02-18 MED ORDER — CYANOCOBALAMIN 1000 MCG/ML IJ SOLN
1000.0000 ug | Freq: Once | INTRAMUSCULAR | Status: AC
  Administered 2022-02-18: 1000 ug via INTRAMUSCULAR

## 2022-02-18 NOTE — Progress Notes (Signed)
Per orders of Dr. [name], injection of 3rd B 12 given in right deltoid per patient preference by Zacarias Pontes, CMA. Patient tolerated injection well.  ?

## 2022-02-18 NOTE — Progress Notes (Deleted)
Per orders of Dr. [name], injection of 3rd B 12 given in deltoid per patient preference by Zacarias Pontes, CMA. Patient tolerated injection well.  ?

## 2022-02-19 DIAGNOSIS — N390 Urinary tract infection, site not specified: Secondary | ICD-10-CM | POA: Diagnosis not present

## 2022-02-21 ENCOUNTER — Encounter: Payer: Self-pay | Admitting: Family Medicine

## 2022-02-22 ENCOUNTER — Other Ambulatory Visit: Payer: Self-pay

## 2022-02-22 DIAGNOSIS — N390 Urinary tract infection, site not specified: Secondary | ICD-10-CM

## 2022-02-22 NOTE — Telephone Encounter (Signed)
Agree with urology referral. Please place order. ? ?We can also give him a handicap placard for if needed. ? ?Algis Greenhouse. [name], MD ?02/22/2022 10:31 AM  ? ?

## 2022-02-22 NOTE — Telephone Encounter (Signed)
Please advise 

## 2022-02-25 ENCOUNTER — Ambulatory Visit (INDEPENDENT_AMBULATORY_CARE_PROVIDER_SITE_OTHER): Payer: Medicare HMO | Admitting: *Deleted

## 2022-02-25 DIAGNOSIS — E538 Deficiency of other specified B group vitamins: Secondary | ICD-10-CM | POA: Diagnosis not present

## 2022-02-25 MED ORDER — CYANOCOBALAMIN 1000 MCG/ML IJ SOLN
1000.0000 ug | Freq: Once | INTRAMUSCULAR | Status: AC
  Administered 2022-02-25: 1000 ug via INTRAMUSCULAR

## 2022-02-25 NOTE — Progress Notes (Signed)
Per orders of Dr. [name], injection of B 12 given in right deltoid by Zacarias Pontes, CMA. Patient tolerated injection well.

## 2022-02-25 NOTE — Progress Notes (Signed)
I have reviewed the patient's encounter and agree with the documentation.  Algis Greenhouse. [name], MD 02/25/2022 12:14 PM

## 2022-03-01 ENCOUNTER — Telehealth: Payer: Self-pay | Admitting: Family Medicine

## 2022-03-01 NOTE — Telephone Encounter (Signed)
Pt came in to pick up handicap placard which was provided by Dr. Marigene Ehlers CMA.

## 2022-03-01 NOTE — Telephone Encounter (Signed)
handicap placard done and given to patient  Copy placed to be scan on patient chart

## 2022-03-02 ENCOUNTER — Ambulatory Visit (INDEPENDENT_AMBULATORY_CARE_PROVIDER_SITE_OTHER): Payer: Medicare HMO | Admitting: *Deleted

## 2022-03-02 DIAGNOSIS — E1159 Type 2 diabetes mellitus with other circulatory complications: Secondary | ICD-10-CM

## 2022-03-02 DIAGNOSIS — N390 Urinary tract infection, site not specified: Secondary | ICD-10-CM

## 2022-03-02 NOTE — Patient Instructions (Addendum)
Visit Information  Thank you for taking time to visit with me today. Please don't hesitate to contact me if I can be of assistance to you before our next scheduled telephone appointment.  Following are the goals we discussed today:  Check blood sugar at least 3 times a week Check blood sugar if I feel it is too high or too low Enter blood sugar readings and medication into daily log Take the blood sugar log to all doctor visits  Contact primary care provider for sustained elevations Call office if I gain more than 2 pounds in one day or 5 pounds in one week Weigh myself daily, at same time each day and record in log Try stick with low salt heart healthy carbohydrate modified diabetic diet Monitor yourself for rapid irregular heart rate, taking prn medication if indicated and notifying provider Take weight log with you to medical appointments Review BP log with Cardiologist  Our next appointment is by telephone on 7/18 at 1400  Please call the care guide team at [phone] if you need to cancel or reschedule your appointment.   If you are experiencing a Mental Health or [place] or need someone to talk to, please call the Suicide and Crisis Lifeline: 988 call the [place] National Suicide Prevention Lifeline: 937 429 7667 or TTY: (905)394-1974 TTY 670-320-8658) to talk to a trained counselor call 1-800-273-TALK (toll free, 24 hour hotline) go to [facility] [address] [place] [phone]) call 911   Patient verbalizes understanding of instructions and care plan provided today and agrees to view in [place]. Active MyChart status and patient understanding of how to access instructions and care plan via MyChart confirmed with patient.     Hubert Azure RN, MSN RN Care Management Coordinator  [facility] [phone] [email or web address]

## 2022-03-02 NOTE — Chronic Care Management (AMB) (Signed)
Chronic Care Management   CCM RN Visit Note  03/02/2022 Name: Brent Taylor MRN: [id number] DOB: July 30, 1941  Subjective: Brent Taylor is a 81 y.o. year old male who is a primary care patient of Brent Barrack, MD. The care management team was consulted for assistance with disease management and care coordination needs.    Engaged with patient by telephone for follow up visit in response to provider referral for case management and/or care coordination services.   Consent to Services:  The patient was given information about Chronic Care Management services, agreed to services, and gave verbal consent prior to initiation of services.  Please see initial visit note for detailed documentation.   Patient agreed to services and verbal consent obtained.   Assessment: Review of patient past medical history, allergies, medications, health status, including review of consultants reports, laboratory and other test data, was performed as part of comprehensive evaluation and provision of chronic care management services.   SDOH (Social Determinants of Health) assessments and interventions performed:    CCM Care Plan  Allergies  Allergen Reactions   Lipitor [Atorvastatin Calcium]     Muscle ache    Outpatient Encounter Medications as of 03/02/2022  Medication Sig   apixaban (ELIQUIS) 5 MG TABS tablet TAKE ONE TABLET BY MOUTH TWICE A DAY   Blood Glucose Monitoring Suppl (ONE TOUCH ULTRA 2) w/Device KIT Use daily to check blood sugar.   diltiazem (CARDIZEM) 30 MG tablet Take 1 tablet every 4 hours AS NEEDED for afib heart rate >100 as long as top blood pressure >100.   FLUoxetine (PROZAC) 40 MG capsule Take 1 capsule (40 mg total) by mouth daily.   gabapentin (NEURONTIN) 100 MG capsule Take 1 capsule (100 mg total) by mouth at bedtime.   ibuprofen (ADVIL,MOTRIN) 200 MG tablet Take 600 mg by mouth as needed for moderate pain.   Lancets (ONETOUCH ULTRASOFT) lancets USE TO TEST BLOOD SUGAR DAILY    losartan (COZAAR) 50 MG tablet Take 1 tablet (50 mg total) by mouth daily.   metoprolol tartrate (LOPRESSOR) 25 MG tablet TAKE 1/2 TABLET BY MOUTH TWO TIMES A DAY   nitrofurantoin, macrocrystal-monohydrate, (MACROBID) 100 MG capsule Take 1 capsule (100 mg total) by mouth 2 (two) times daily.   ONETOUCH ULTRA test strip USE TO TEST BLOOD SUGAR DAILY AS DIRECTED   pantoprazole (PROTONIX) 20 MG tablet Take 1 tablet (20 mg total) by mouth daily.   pravastatin (PRAVACHOL) 80 MG tablet Take 1 tablet (80 mg total) by mouth every evening.   tadalafil (CIALIS) 10 MG tablet Take 1 tablet (10 mg total) by mouth daily.   traMADol (ULTRAM) 50 MG tablet Take 2 tablets (100 mg total) by mouth every 12 (twelve) hours as needed.   No facility-administered encounter medications on file as of 03/02/2022.    Patient Active Problem List   Diagnosis Date Noted   Left shoulder pain 01/26/2022   Erectile dysfunction 10/21/2021   Thoracic Aortic dilatation ([place]) 02/13/2021   OSA (obstructive sleep apnea) 12/09/2020   Sensorineural hearing loss (SNHL) of left ear with restricted hearing of right ear 08/19/2020   Subjective tinnitus, bilateral 08/19/2020   Atrial fibrillation ([place]) 06/10/2020   Degeneration of lumbar intervertebral disc 03/31/2020   Low back pain 03/11/2020   Spinal stenosis of lumbar region 08/29/2019   Type 2 diabetes mellitus with vascular disease ([place]) 10/20/2016   Hypertension associated with diabetes ([place]) 11/20/2015   Coronary artery disease 12/10/2011   Dyslipidemia associated with type 2 diabetes  mellitus ([place]) 12/10/2011   Major depression in remission ([place]) 12/10/2011   GERD (gastroesophageal reflux disease) 12/10/2011   Hx of colonic polyps 12/10/2011   Melanoma ([place]) 12/10/2011    Conditions to be addressed/monitored:HTN and DMII  Care Plan : Diabetes Type 2 (Adult)  Updates made by Leona Singleton, RN since 03/02/2022 12:00 AM     Problem: Glycemic Management (Diabetes,  Type 2) & Heart Failure   Priority: Medium     Long-Range Goal: Patient will report maintaining Hgb A1C of below 7 in the next 90 days and work with CCM team to better self care manage chronic medical conditions   Start Date: 10/20/2021  Expected End Date: 11/10/2022  Recent Progress: On track  Priority: Medium  Note:   Current Barriers:  Knowledge Deficits related to basic Diabetes pathophysiology and self care/management;    Knowledge deficits related to basic heart failure pathophysiology and self care management as evidenced by patient not weighing himself daily.     5/23--Reports monitoring blood sugars about every other day, with fasting ranges of 100-120's, 105 this morning.  Denies any episodes of hypo or hyperglycemia. Denies any chest pain,  lower extremity edema or shortness of breath.  Continues to blood pressures ranging (140/80'S).  Reporting he has completed 3rd round of antibiotics for his 3rd UTI.  States he has appointment scheduled with urologist on 6/1. Re Case Manager Clinical Goal(s):  patient will demonstrate improved adherence to prescribed treatment plan for diabetes self care/management as evidenced by: every other day monitoring and recording of CBG; adherence to ADA/ carb modified diet; adherence to prescribed medication regimen; contacting provider for new or worsened symptoms or questions Over the next 90 days, patient will weigh self daily and record Over the next 120 days, patient will verbalize understanding of Heart Failure Action Plan and when to call doctor  Diabetes:  (Status: Goal on Track (progressing): YES.) Long Term Goal   Lab Results  Component Value Date   HGBA1C 6.4 (A) 10/21/2021   @ Interventions:  Assessed patient's understanding of A1c goal: <6.5% Provided education to patient about basic DM disease process; Reviewed medications with patient and discussed importance of medication adherence;        Counseled on importance of regular  laboratory monitoring as prescribed;        Discussed plans with patient for ongoing care management follow up and provided patient with direct contact information for care management team;      Provided patient with written educational materials related to hypo and hyperglycemia and importance of correct treatment;       Advised patient, providing education and rationale, to check cbg when you have symptoms of low or high blood sugar and 3 times a week  and record         Collaboration with Brent Barrack, MD regarding development and update of comprehensive plan of care as evidenced by provider attestation and co-signature Inter-disciplinary care team collaboration (see longitudinal plan of care) Provided patient with verbal education related to hypo and hyperglycemia and importance of correct treatment; and self-awareness of signs/symptoms of hypo or hyperglycemia encouraged Barriers to adherence to treatment plan identified Blood glucose monitoring encouraged; blood glucose readings reviewed; - use of blood glucose monitoring log promoted Mutual A1C goal set or reviewed; Discussed ways to help maintain current A1C Congratulated patient on current A1C (still within goal) Encouraged to attend all scheduled provider appointments Discussed and encouraged to adhere to prescribed ADA/carb modified Encouraged to  continue to increase activity as tolerated, going to gym No immediate recommendations/changes  Heart Failure Interventions:  (Status: Goal on Track (progressing): YES. Condition stable. Not addressed this visit.)  Long Term Goal  Wt Readings from Last 3 Encounters:  01/26/22 250 lb 3.2 oz (113.5 kg)  11/10/21 247 lb (112 kg)  10/28/21 244 lb (110.7 kg)  Basic overview and discussion of pathophysiology of Heart Failure reviewed Provided education on low sodium diet Reviewed Heart Failure Action Plan in depth and provided written copy Discussed importance of daily weight and advised  patient to weigh and record daily Discussed the importance of keeping all appointments with provider  Provided verbal education on low sodium heart healthy carbohydrate modified diabetic diet Reviewed signs and symptoms of heart failure and discussed Heart Failure Action Plan  Rediscussed importance of daily weight monitoring and when to call provider based on weight Encouraged patient to view EMMI Heart Failure  and daily weight monitoring educational video  Encouraged to continue to monitor heart rate for increase rate and being out of rhythm  Encouraged continued blood pressure monitoring and notifying provider for sustained elevations Encouraged to keep and attend scheduled medical appointments Congratulated on increase in activity, going to gym and recent weight loss Encouraged continued monitoring of BP reviewing log with Cardiology for medication adjustment Patient Goals/Self-Care Activities: Check blood sugar at least 3 times a week Check blood sugar if I feel it is too high or too low Enter blood sugar readings and medication into daily log Take the blood sugar log to all doctor visits  Contact primary care provider for sustained elevations Call office if I gain more than 2 pounds in one day or 5 pounds in one week Weigh myself daily, at same time each day and record in log Try stick with low salt heart healthy carbohydrate modified diabetic diet Monitor yourself for rapid irregular heart rate, taking prn medication if indicated and notifying provider Take weight log with you to medical appointments Review BP log with Cardiologist Follow Up Plan: The care management team will reach out to the patient again over the next 60 business days.      Plan:The care management team will reach out to the patient again over the next 60 days.  Hubert Azure RN, MSN RN Care Management Coordinator  [facility] [phone] [email or web address]

## 2022-03-10 DIAGNOSIS — E1159 Type 2 diabetes mellitus with other circulatory complications: Secondary | ICD-10-CM

## 2022-03-10 DIAGNOSIS — I509 Heart failure, unspecified: Secondary | ICD-10-CM | POA: Diagnosis not present

## 2022-03-11 DIAGNOSIS — N3020 Other chronic cystitis without hematuria: Secondary | ICD-10-CM | POA: Diagnosis not present

## 2022-03-11 DIAGNOSIS — R3915 Urgency of urination: Secondary | ICD-10-CM | POA: Diagnosis not present

## 2022-03-11 DIAGNOSIS — R350 Frequency of micturition: Secondary | ICD-10-CM | POA: Diagnosis not present

## 2022-03-11 DIAGNOSIS — N401 Enlarged prostate with lower urinary tract symptoms: Secondary | ICD-10-CM | POA: Diagnosis not present

## 2022-03-11 DIAGNOSIS — N5201 Erectile dysfunction due to arterial insufficiency: Secondary | ICD-10-CM | POA: Diagnosis not present

## 2022-03-11 DIAGNOSIS — R3121 Asymptomatic microscopic hematuria: Secondary | ICD-10-CM | POA: Diagnosis not present

## 2022-03-21 DIAGNOSIS — G4733 Obstructive sleep apnea (adult) (pediatric): Secondary | ICD-10-CM | POA: Diagnosis not present

## 2022-03-25 ENCOUNTER — Encounter: Payer: Self-pay | Admitting: Family Medicine

## 2022-03-25 NOTE — Telephone Encounter (Signed)
Left message to return call to our office at their convenience.  Patient need OV for evaluation

## 2022-03-26 DIAGNOSIS — R300 Dysuria: Secondary | ICD-10-CM | POA: Diagnosis not present

## 2022-03-26 DIAGNOSIS — R350 Frequency of micturition: Secondary | ICD-10-CM | POA: Diagnosis not present

## 2022-03-26 NOTE — Telephone Encounter (Signed)
Patient called back - will go to [facility] as we do not have openings at the moment -

## 2022-03-29 DIAGNOSIS — R3121 Asymptomatic microscopic hematuria: Secondary | ICD-10-CM | POA: Diagnosis not present

## 2022-03-29 DIAGNOSIS — R319 Hematuria, unspecified: Secondary | ICD-10-CM | POA: Diagnosis not present

## 2022-03-29 DIAGNOSIS — K5730 Diverticulosis of large intestine without perforation or abscess without bleeding: Secondary | ICD-10-CM | POA: Diagnosis not present

## 2022-03-31 ENCOUNTER — Telehealth: Payer: Self-pay | Admitting: Pharmacist

## 2022-03-31 NOTE — Progress Notes (Unsigned)
Chronic Care Management Pharmacy Assistant   Name: Brent Taylor  MRN: [id number] DOB: 1940/11/16   Reason for Encounter: Hypertension Adherence Call    Recent office visits:  01/26/2022 OV (PCP) [name], MD;  Can consider increasing dose of gabapentin if needed.   10/28/2021 OV (Fam Med) Tawnya Crook, MD; no medication changes indicated.  Recent consult visits:  11/10/2021 OV (Cardiology) Werner Lean, MD; he wil restart AMB BP monitoring and if still increased by Valentine's day will increase ACEi  Hospital visits:  01/09/2022 ED visit for Paroxysmal atrial fibrillation -Rx Ciprofloxacin 500 mg twice daily  Medications: Outpatient Encounter Medications as of 03/31/2022  Medication Sig   apixaban (ELIQUIS) 5 MG TABS tablet TAKE ONE TABLET BY MOUTH TWICE A DAY   Blood Glucose Monitoring Suppl (ONE TOUCH ULTRA 2) w/Device KIT Use daily to check blood sugar.   diltiazem (CARDIZEM) 30 MG tablet Take 1 tablet every 4 hours AS NEEDED for afib heart rate >100 as long as top blood pressure >100.   FLUoxetine (PROZAC) 40 MG capsule Take 1 capsule (40 mg total) by mouth daily.   gabapentin (NEURONTIN) 100 MG capsule Take 1 capsule (100 mg total) by mouth at bedtime.   ibuprofen (ADVIL,MOTRIN) 200 MG tablet Take 600 mg by mouth as needed for moderate pain.   Lancets (ONETOUCH ULTRASOFT) lancets USE TO TEST BLOOD SUGAR DAILY   losartan (COZAAR) 50 MG tablet Take 1 tablet (50 mg total) by mouth daily.   metoprolol tartrate (LOPRESSOR) 25 MG tablet TAKE 1/2 TABLET BY MOUTH TWO TIMES A DAY   nitrofurantoin, macrocrystal-monohydrate, (MACROBID) 100 MG capsule Take 1 capsule (100 mg total) by mouth 2 (two) times daily.   ONETOUCH ULTRA test strip USE TO TEST BLOOD SUGAR DAILY AS DIRECTED   pantoprazole (PROTONIX) 20 MG tablet Take 1 tablet (20 mg total) by mouth daily.   pravastatin (PRAVACHOL) 80 MG tablet Take 1 tablet (80 mg total) by mouth every evening.   tadalafil  (CIALIS) 10 MG tablet Take 1 tablet (10 mg total) by mouth daily.   traMADol (ULTRAM) 50 MG tablet Take 2 tablets (100 mg total) by mouth every 12 (twelve) hours as needed.   No facility-administered encounter medications on file as of 03/31/2022.   Reviewed chart prior to disease state call. Spoke with patient regarding BP  Recent Office Vitals: BP Readings from Last 3 Encounters:  01/26/22 128/80  01/09/22 127/79  11/10/21 140/90   Pulse Readings from Last 3 Encounters:  01/26/22 (!) 51  01/09/22 92  11/10/21 61    Wt Readings from Last 3 Encounters:  01/26/22 250 lb 3.2 oz (113.5 kg)  11/10/21 247 lb (112 kg)  10/28/21 244 lb (110.7 kg)     Kidney Function Lab Results  Component Value Date/Time   CREATININE 1.16 01/09/2022 03:30 PM   CREATININE 1.11 10/21/2021 09:23 AM   GFR 61.65 10/21/2021 09:23 AM   GFRNONAA >60 01/09/2022 03:30 PM   GFRAA >60 04/25/2016 01:45 PM       Latest Ref Rng & Units 01/09/2022    3:30 PM 10/21/2021    9:23 AM 10/20/2021    2:18 PM  BMP  Glucose 70 - 99 mg/dL 130  125  110   BUN 8 - 23 mg/dL $Remove'12  21  20   'FrrDWZg$ Creatinine 0.61 - 1.24 mg/dL 1.16  1.11  1.19   BUN/Creat Ratio 10 - 24   17   Sodium 135 - 145 mmol/L  137  140  141   Potassium 3.5 - 5.1 mmol/L 4.2  4.5  4.4   Chloride 98 - 111 mmol/L 104  105  104   CO2 22 - 32 mmol/L $RemoveB'25  27  22   'HXLdKNAS$ Calcium 8.9 - 10.3 mg/dL 9.1  9.2  9.3     Current antihypertensive regimen:  Losartan 50 mg daily Metoprolol Tartrate 25 mg twice daily Diltiazem 30 mg as needed for Afib  How often are you checking your Blood Pressure? weekly  Current home BP readings: 120/80  What recent interventions/DTPs have been made by any provider to improve Blood Pressure control since last CPP Visit: No recent interventions or DTPs.  Any recent hospitalizations or ED visits since last visit with CPP? No  What diet changes have been made to improve Blood Pressure Control?  Patient states he eats a healthy diet.  What  exercise is being done to improve your Blood Pressure Control?  Patient states he likes to do some yard work.  Adherence Review: Is the patient currently on ACE/ARB medication? Yes Does the patient have >5 day gap between last estimated fill dates? No  Care Gaps: Medicare Annual Wellness: Completed 12/03/2021 Ophthalmology Exam: Next due on 05/21/2022 Foot Exam: Overdue since 10/31/2019 Hemoglobin A1C: 6.4% on 10/21/2021 Colonoscopy: Completed 10/11/2009  Future Appointments  Date Time Provider [place]  04/22/2022  9:00 AM [name], MD [facility]  04/27/2022  2:00 PM [facility] CARE MGR [facility]  06/08/2022 10:30 AM [facility] PHARMACIST [facility]  10/25/2022  7:30 AM [facility]  12/23/2022  9:30 AM [facility] HEALTH COACH [facility]   Star Rating Drugs: Losartan 50 mg last filled 01/12/2022 90 DS Pravastatin 80 mg last filled 02/07/2022 90 DS  April D Calhoun, [place] Pharmacist Assistant [phone]

## 2022-04-07 ENCOUNTER — Other Ambulatory Visit: Payer: Self-pay

## 2022-04-07 MED ORDER — METOPROLOL TARTRATE 25 MG PO TABS: ORAL_TABLET | ORAL | 1 refills | Status: DC

## 2022-04-08 DIAGNOSIS — N35013 Post-traumatic anterior urethral stricture: Secondary | ICD-10-CM | POA: Diagnosis not present

## 2022-04-08 DIAGNOSIS — R3121 Asymptomatic microscopic hematuria: Secondary | ICD-10-CM | POA: Diagnosis not present

## 2022-04-08 DIAGNOSIS — N3020 Other chronic cystitis without hematuria: Secondary | ICD-10-CM | POA: Diagnosis not present

## 2022-04-09 ENCOUNTER — Other Ambulatory Visit: Payer: Self-pay | Admitting: Family Medicine

## 2022-04-19 DIAGNOSIS — G4733 Obstructive sleep apnea (adult) (pediatric): Secondary | ICD-10-CM | POA: Diagnosis not present

## 2022-04-22 ENCOUNTER — Ambulatory Visit (INDEPENDENT_AMBULATORY_CARE_PROVIDER_SITE_OTHER): Payer: Medicare HMO | Admitting: Family Medicine

## 2022-04-22 ENCOUNTER — Encounter: Payer: Self-pay | Admitting: Family Medicine

## 2022-04-22 VITALS — BP 130/70 | HR 51 | Temp 97.7°F | Ht 72.0 in | Wt 248.4 lb

## 2022-04-22 DIAGNOSIS — E538 Deficiency of other specified B group vitamins: Secondary | ICD-10-CM | POA: Insufficient documentation

## 2022-04-22 DIAGNOSIS — N390 Urinary tract infection, site not specified: Secondary | ICD-10-CM

## 2022-04-22 DIAGNOSIS — E1169 Type 2 diabetes mellitus with other specified complication: Secondary | ICD-10-CM

## 2022-04-22 DIAGNOSIS — M48061 Spinal stenosis, lumbar region without neurogenic claudication: Secondary | ICD-10-CM

## 2022-04-22 DIAGNOSIS — I152 Hypertension secondary to endocrine disorders: Secondary | ICD-10-CM | POA: Diagnosis not present

## 2022-04-22 DIAGNOSIS — E785 Hyperlipidemia, unspecified: Secondary | ICD-10-CM | POA: Diagnosis not present

## 2022-04-22 DIAGNOSIS — E1159 Type 2 diabetes mellitus with other circulatory complications: Secondary | ICD-10-CM

## 2022-04-22 LAB — COMPREHENSIVE METABOLIC PANEL
ALT: 15 U/L (ref 0–53)
AST: 20 U/L (ref 0–37)
Albumin: 4.2 g/dL (ref 3.5–5.2)
Alkaline Phosphatase: 44 U/L (ref 39–117)
BUN: 19 mg/dL (ref 6–23)
CO2: 26 mEq/L (ref 19–32)
Calcium: 9.1 mg/dL (ref 8.4–10.5)
Chloride: 105 mEq/L (ref 96–112)
Creatinine, Ser: 1.32 mg/dL (ref 0.40–1.50)
GFR: 49.9 mL/min — ABNORMAL LOW (ref >60.00)
Glucose, Bld: 123 mg/dL — ABNORMAL HIGH (ref 70–99)
Potassium: 4.8 mEq/L (ref 3.5–5.1)
Sodium: 139 mEq/L (ref 135–145)
Total Bilirubin: 0.4 mg/dL (ref 0.2–1.2)
Total Protein: 6.8 g/dL (ref 6.0–8.3)

## 2022-04-22 LAB — LIPID PANEL
Cholesterol: 121 mg/dL (ref 0–200)
HDL: 32 mg/dL — ABNORMAL LOW (ref >39.00)
LDL Cholesterol: 58 mg/dL (ref 0–99)
NonHDL: 89.4
Total CHOL/HDL Ratio: 4
Triglycerides: 159 mg/dL — ABNORMAL HIGH (ref 0.0–149.0)
VLDL: 31.8 mg/dL (ref 0.0–40.0)

## 2022-04-22 LAB — CBC
HCT: 42.5 % (ref 39.0–52.0)
Hemoglobin: 13.3 g/dL (ref 13.0–17.0)
MCHC: 31.4 g/dL (ref 30.0–36.0)
MCV: 84.9 fl (ref 78.0–100.0)
Platelets: 166 10*3/uL (ref 150.0–400.0)
RBC: 5 Mil/uL (ref 4.22–5.81)
RDW: 15.6 % — ABNORMAL HIGH (ref 11.5–15.5)
WBC: 4.6 10*3/uL (ref 4.0–10.5)

## 2022-04-22 LAB — POCT GLYCOSYLATED HEMOGLOBIN (HGB A1C): Hemoglobin A1C: 6.3 % — AB (ref 4.0–5.6)

## 2022-04-22 LAB — VITAMIN B12: Vitamin B-12: 275 pg/mL (ref 211–911)

## 2022-04-22 LAB — TSH: TSH: 1.91 u[IU]/mL (ref 0.35–5.50)

## 2022-04-22 MED ORDER — GABAPENTIN 300 MG PO CAPS: 300.0000 mg | ORAL_CAPSULE | Freq: Every day | ORAL | 3 refills | Status: DC

## 2022-04-22 MED ORDER — TRAMADOL HCL 50 MG PO TABS
100.0000 mg | ORAL_TABLET | Freq: Two times a day (BID) | ORAL | 5 refills | Status: DC | PRN
Start: 2022-04-22 — End: 2022-09-14

## 2022-04-22 NOTE — Assessment & Plan Note (Signed)
All pressure at goal on metoprolol tartrate 12.5 mg twice daily and quinapril 20 mg daily.  We will check labs today.

## 2022-04-22 NOTE — Assessment & Plan Note (Signed)
Still continues to be bothersome.  No red flag signs or symptoms.  We will refill his tramadol.  We will increase his dose of gabapentin to 300 mg nightly.  If this continues to be an issue he will need to follow back up with his orthopedist.

## 2022-04-22 NOTE — Assessment & Plan Note (Signed)
Check labs.  He is on pravastatin 40 mg daily.

## 2022-04-22 NOTE — Patient Instructions (Signed)
It was very nice to see you today!  We will check blood work today.  Your A1c looks great.  Please continue the good work with diet and exercise.  We will increase your gabapentin to 300 mg nightly.  Please send me a message in a few weeks to let me how this is working.  I will refill your tramadol.  We will see you back in 6 months.  Please come back to see [place] sooner if needed.  Take care, Dr [name]  PLEASE NOTE:  If you had any lab tests please let us know if you have not heard back within a few days. You may see your results on mychart before we have a chance to review them but we will give you a call once they are reviewed by [place]. If we ordered any referrals today, please let us know if you have not heard from their office within the next week.   Please try these tips to maintain a healthy lifestyle:  Eat at least 3 REAL meals and 1-2 snacks per day.  Aim for no more than 5 hours between eating.  If you eat breakfast, please do so within one hour of getting up.   Each meal should contain half fruits/vegetables, one quarter protein, and one quarter carbs (no bigger than a computer mouse)  Cut down on sweet beverages. This includes juice, soda, and sweet tea.   Drink at least 1 glass of water with each meal and aim for at least 8 glasses per day  Exercise at least 150 minutes every week.

## 2022-04-22 NOTE — Progress Notes (Signed)
   Brent Taylor is a 81 y.o. male who presents today for an office visit.  Assessment/Plan:  Chronic Problems Addressed Today: Hypertension associated with diabetes ([name]) All pressure at goal on metoprolol tartrate 12.5 mg twice daily and quinapril 20 mg daily.  We will check labs today.  Dyslipidemia associated with type 2 diabetes mellitus (Brent Taylor) Check labs.  He is on pravastatin 40 mg daily.  Spinal stenosis of lumbar region Still continues to be bothersome.  No red flag signs or symptoms.  We will refill his tramadol.  We will increase his dose of gabapentin to 300 mg nightly.  If this continues to be an issue he will need to follow back up with his orthopedist.  Type 2 diabetes mellitus with vascular disease (Brent Taylor) A1c stable 6.3 off medications.  Continue lifestyle modifications.  Recheck in 6 months.  B12 deficiency Check B12.     Subjective:  HPI:  See A/P for status of chronic condition.         Objective:  Physical Exam: BP 130/70   Pulse (!) 51   Temp 97.7 F (36.5 C) (Temporal)   Ht 6' (1.829 m)   Wt 248 lb 6.4 oz (112.7 kg)   SpO2 96%   BMI 33.69 kg/m   Gen: No acute distress, resting comfortably CV: Regular rate and rhythm with no murmurs appreciated Pulm: Normal work of breathing, clear to auscultation bilaterally with no crackles, wheezes, or rhonchi Neuro: Grossly normal, moves all extremities Psych: Normal affect and thought content      Brent Taylor M. [name], MD 04/22/2022 9:44 AM

## 2022-04-22 NOTE — Assessment & Plan Note (Signed)
A1c stable 6.3 off medications.  Continue lifestyle modifications.  Recheck in 6 months.

## 2022-04-22 NOTE — Assessment & Plan Note (Signed)
Check B12 

## 2022-04-23 LAB — URINE CULTURE
MICRO NUMBER:: 13642865
Result:: NO GROWTH
SPECIMEN QUALITY:: ADEQUATE

## 2022-04-26 NOTE — Progress Notes (Signed)
Please inform patient of the following:  His urine culture is negative with no signs of infection.  All of his other labs are stable.  We can recheck again in 6 to 12 months.

## 2022-04-27 ENCOUNTER — Ambulatory Visit (INDEPENDENT_AMBULATORY_CARE_PROVIDER_SITE_OTHER): Payer: Medicare HMO | Admitting: *Deleted

## 2022-04-27 DIAGNOSIS — E1159 Type 2 diabetes mellitus with other circulatory complications: Secondary | ICD-10-CM

## 2022-04-27 DIAGNOSIS — I5042 Chronic combined systolic (congestive) and diastolic (congestive) heart failure: Secondary | ICD-10-CM

## 2022-04-27 NOTE — Chronic Care Management (AMB) (Signed)
  Care Management   Follow Up Note   04/27/2022 Name: Brent Taylor MRN: [id number] DOB: Apr 21, 1941   Referred by: [name], MD Reason for referral : Case Closure   Successful outreach to patient.  States he is doing well without complaints.  Discussed goals and both agree patient has met goals of the program.  Follow Up Plan: The patient has been provided with contact information for the care management team and has been advised to call with any health-related questions or concerns.  No further follow up required: as personal goals have been met.  Hubert Azure RN, MSN RN Care Management Coordinator  [place] [phone] [name]

## 2022-04-27 NOTE — Patient Instructions (Signed)
CONGRATULATIONS ON COMPLETING YOUR GOALS.  IT AS BEEN A PLEASURE WORKING WITH AND TALKING TO YOU.  IF  NEEDS [place] IN THE FUTURE PLEASE DO NOT HESITATE TO CONTACT ME  [phone]   Laken Lobato RN, MSN RN Care Management Coordinator   [facility] [phone] [email or web address]  

## 2022-05-06 ENCOUNTER — Encounter: Payer: Self-pay | Admitting: Family Medicine

## 2022-05-06 NOTE — Telephone Encounter (Signed)
Please advise 

## 2022-05-07 NOTE — Telephone Encounter (Signed)
Dr. [name], please send new Rx for gabapentin 100 mg at hs. I have loaded it in cart.

## 2022-05-07 NOTE — Telephone Encounter (Signed)
Spoke to pt told him calling about his Gabapentin. Told him Dr. [name] said you should go back to 100 mg at bedtime. Pt verbalized understanding. Asked him if he needs a new Rx? Pt said yes he will soon. Told him okay, I will have Dr. [name] send Rx in. Pt verbalized understanding.

## 2022-05-07 NOTE — Telephone Encounter (Signed)
I appreciate the update. We should go back to '100mg'$  nightly. Please send in new rx if needed.  Algis Greenhouse. [name], MD 05/07/2022 11:26 AM

## 2022-05-08 ENCOUNTER — Other Ambulatory Visit: Payer: Self-pay | Admitting: Family Medicine

## 2022-05-10 DIAGNOSIS — I5042 Chronic combined systolic (congestive) and diastolic (congestive) heart failure: Secondary | ICD-10-CM | POA: Diagnosis not present

## 2022-05-10 DIAGNOSIS — E1159 Type 2 diabetes mellitus with other circulatory complications: Secondary | ICD-10-CM

## 2022-05-10 MED ORDER — GABAPENTIN 100 MG PO CAPS: 100.0000 mg | ORAL_CAPSULE | Freq: Every day | ORAL | 3 refills | Status: DC

## 2022-05-11 ENCOUNTER — Other Ambulatory Visit: Payer: Self-pay | Admitting: *Deleted

## 2022-05-11 DIAGNOSIS — I480 Paroxysmal atrial fibrillation: Secondary | ICD-10-CM

## 2022-05-11 MED ORDER — APIXABAN 5 MG PO TABS: 5.0000 mg | ORAL_TABLET | Freq: Two times a day (BID) | ORAL | 5 refills | Status: DC

## 2022-05-11 NOTE — Telephone Encounter (Signed)
Eliquis '5mg'$  refill request received. Patient is 81 years old, weight-112.7kg, Crea-1.32 on 04/22/2022, Diagnosis-Afib, and last seen by Dr. Gasper Sells on 11/10/2021. Dose is appropriate based on dosing criteria. Will send in refill to requested pharmacy.

## 2022-05-14 ENCOUNTER — Encounter: Payer: Self-pay | Admitting: Family Medicine

## 2022-05-15 NOTE — Telephone Encounter (Signed)
Spoke with patient, patient aware Rx was send for #90 3 month supply

## 2022-05-20 DIAGNOSIS — G4733 Obstructive sleep apnea (adult) (pediatric): Secondary | ICD-10-CM | POA: Diagnosis not present

## 2022-05-24 ENCOUNTER — Encounter: Payer: Self-pay | Admitting: Family Medicine

## 2022-05-31 NOTE — Progress Notes (Signed)
Chronic Care Management Pharmacy Note  06/09/2022 Name:  Cutberto Winfree MRN:  [id number] DOB:  [birth year]  Recommendations/Changes made from today's visit: No Changes, BP seems to be well controlled on new medication regimen.  Subjective: Brent Taylor is an 81 y.o. year old male who is a primary patient of [name], Algis Greenhouse, MD.  The CCM team was consulted for assistance with disease management and care coordination needs.    Engaged with patient by telephone for follow up visit in response to provider referral for pharmacy case management and/or care coordination services.   Consent to Services:  The patient was given information about Chronic Care Management services, agreed to services, and gave verbal consent prior to initiation of services.  Please see initial visit note for detailed documentation.   Patient Care Team: [name], MD as PCP - General (Family Medicine) Werner Lean, MD as PCP - Cardiology (Cardiology) Sueanne Margarita, MD as PCP - Sleep Medicine (Cardiology) Madelin Rear, [facility] (Pharmacist) Madelin Rear, [facility] as Pharmacist (Pharmacist)  Hospital visits: None in previous 6 months  Objective:  Lab Results  Component Value Date   CREATININE 1.32 04/22/2022   CREATININE 1.16 01/09/2022   CREATININE 1.11 10/21/2021    Lab Results  Component Value Date   HGBA1C 6.3 (A) 04/22/2022   Last diabetic Eye exam:  Lab Results  Component Value Date/Time   HMDIABEYEEXA No Retinopathy 05/21/2021 12:00 AM    Last diabetic Foot exam:  Lab Results  Component Value Date/Time   HMDIABFOOTEX NORMAL 12/10/2011 12:00 AM        Component Value Date/Time   CHOL 121 04/22/2022 0941   CHOL 123 01/25/2022 0910   TRIG 159.0 (H) 04/22/2022 0941   HDL 32.00 (L) 04/22/2022 0941   HDL 33 (L) 01/25/2022 0910   CHOLHDL 4 04/22/2022 0941   VLDL 31.8 04/22/2022 0941   LDLCALC 58 04/22/2022 0941   LDLCALC 68 01/25/2022 0910   LDLDIRECT 142.7 12/10/2011 1436        Latest Ref Rng & Units 04/22/2022    9:41 AM 01/25/2022    9:10 AM 01/09/2022    3:30 PM  Hepatic Function  Total Protein 6.0 - 8.3 g/dL 6.8   7.0   Albumin 3.5 - 5.2 g/dL 4.2   4.1   AST 0 - 37 U/L 20   26   ALT 0 - 53 U/L _0 Alk Phosphatase 39 - 117 U/L 44   45   Total Bilirubin 0.2 - 1.2 mg/dL 0.4   1.0     Lab Results  Component Value Date/Time   TSH 1.91 04/22/2022 09:41 AM   TSH 1.90 10/21/2021 09:23 AM   FREET4 1.06 11/09/2015 10:15 PM       Latest Ref Rng & Units 04/22/2022    9:41 AM 01/09/2022    3:29 PM 10/21/2021    9:23 AM  CBC  WBC 4.0 - 10.5 K/uL 4.6  15.5  4.5   Hemoglobin 13.0 - 17.0 g/dL 13.3  14.6  14.5   Hematocrit 39.0 - 52.0 % 42.5  47.7  46.0   Platelets 150.0 - 400.0 K/uL 166.0  192  236.0     No results found for: "VD25OH"  Clinical ASCVD:  The ASCVD Risk score (Arnett DK, et al., 2019) failed to calculate for the following reasons:   The 2019 ASCVD risk score is only valid for ages 88 to 37    Social  History   Tobacco Use  Smoking Status Never  Smokeless Tobacco Never   BP Readings from Last 3 Encounters:  04/22/22 130/70  01/26/22 128/80  01/09/22 127/79   Pulse Readings from Last 3 Encounters:  04/22/22 (!) 51  01/26/22 (!) 51  01/09/22 92   Wt Readings from Last 3 Encounters:  04/22/22 248 lb 6.4 oz (112.7 kg)  01/26/22 250 lb 3.2 oz (113.5 kg)  11/10/21 247 lb (112 kg)    Assessment: Review of patient past medical history, allergies, medications, health status, including review of consultants reports, laboratory and other test data, was performed as part of comprehensive evaluation and provision of chronic care management services.   SDOH:  (Social Determinants of Health) assessments and interventions performed: No, done within a year  Financial Resource Strain: Low Risk  (12/03/2021)   Overall Financial Resource Strain (CARDIA)    Difficulty of Paying Living Expenses: Not hard at all     [place]  Allergies   Allergen Reactions   Lipitor [Atorvastatin Calcium]     Muscle ache    Medications Reviewed Today     Reviewed by Edythe Clarity, [facility] (Pharmacist) on 06/09/22 at 1145  Med List Status: <None>   Medication Order Taking? Sig Documenting Provider Last Dose Status Informant  apixaban (ELIQUIS) 5 MG TABS tablet [id number] Yes Take 1 tablet (5 mg total) by mouth 2 (two) times daily. Werner Lean, MD Taking Active   Blood Glucose Monitoring Suppl (ONE TOUCH ULTRA 2) w/Device KIT [id number] Yes Use daily to check blood sugar. [name], MD Taking Active   diltiazem (CARDIZEM) 30 MG tablet [id number] Yes Take 1 tablet every 4 hours AS NEEDED for afib heart rate >100 as long as top blood pressure >100. Sherran Needs, NP Taking Active   FLUoxetine (PROZAC) 40 MG capsule [id number] Yes Take 1 capsule (40 mg total) by mouth daily. [name], MD Taking Active   gabapentin (NEURONTIN) 100 MG capsule [id number] Yes Take 1 capsule (100 mg total) by mouth at bedtime. [name], MD Taking Active   ibuprofen (ADVIL,MOTRIN) 200 MG tablet [id number] Yes Take 600 mg by mouth as needed for moderate pain. [provider] Taking Active   Lancets [facility] ULTRASOFT) lancets [id number] Yes USE TO TEST BLOOD SUGAR DAILY [name], MD Taking Active   losartan (COZAAR) 50 MG tablet [id number] Yes TAKE ONE TABLET BY MOUTH DAILY [name], MD Taking Active   metoprolol tartrate (LOPRESSOR) 25 MG tablet [id number] Yes TAKE 1/2 TABLET BY MOUTH TWO TIMES A DAY Werner Lean, MD Taking Active   [facility] ULTRA test strip [id number] Yes USE TO TEST BLOOD SUGAR DAILY AS DIRECTED [name], MD Taking Active   pantoprazole (PROTONIX) 20 MG tablet [id number] Yes TAKE ONE TABLET BY MOUTH DAILY [name], MD Taking Active   pravastatin (PRAVACHOL) 80 MG tablet [id number] Yes Take 1 tablet (80 mg total) by mouth every evening. Werner Lean, MD Taking Active    tadalafil (CIALIS) 10 MG tablet [id number] Yes Take 1 tablet (10 mg total) by mouth daily. [name], MD Taking Active   traMADol Veatrice Bourbon) 50 MG tablet [id number] Yes Take 2 tablets (100 mg total) by mouth every 12 (twelve) hours as needed. [name], MD Taking Active             Patient Active Problem List   Diagnosis Date Noted   B12 deficiency 04/22/2022  Left shoulder pain 01/26/2022   Erectile dysfunction 10/21/2021   Thoracic Aortic dilatation ([place]) 02/13/2021   OSA (obstructive sleep apnea) 12/09/2020   Sensorineural hearing loss (SNHL) of left ear with restricted hearing of right ear 08/19/2020   Subjective tinnitus, bilateral 08/19/2020   Atrial fibrillation ([place]) 06/10/2020   Degeneration of lumbar intervertebral disc 03/31/2020   Low back pain 03/11/2020   Spinal stenosis of lumbar region 08/29/2019   Type 2 diabetes mellitus with vascular disease ([place]) 10/20/2016   Hypertension associated with diabetes ([place]) 11/20/2015   Coronary artery disease 12/10/2011   Dyslipidemia associated with type 2 diabetes mellitus ([place]) 12/10/2011   Major depression in remission ([place]) 12/10/2011   GERD (gastroesophageal reflux disease) 12/10/2011   Hx of colonic polyps 12/10/2011   Melanoma ([place]) 12/10/2011    Immunization History  Administered Date(s) Administered   Fluad Quad(high Dose 65+) 06/25/2021   Influenza Split 07/14/2012   Influenza, High Dose Seasonal PF 07/28/2015, 09/14/2016, 10/07/2017, 07/11/2018, 05/31/2019   Influenza,inj,Quad PF,6+ Mos 07/16/2013, 07/25/2014   Influenza,inj,quad, With Preservative 07/16/2019   PFIZER(Purple Top)SARS-COV-2 Vaccination 11/19/2019, 12/14/2019, 08/22/2020   Pneumococcal Conjugate-13 03/13/2014   Pneumococcal Polysaccharide-23 07/16/2013   Tetanus 03/13/2014   Zoster Recombinat (Shingrix) 02/09/2022, 05/07/2022    Conditions to be addressed/monitored: Hypertension, Hyperlipidemia, Diabetes, Atrial Fibrillation, Heart  Failure, GERD and Depression  Medication Assistance: None required.  Patient affirms current coverage meets needs.  Compliance/Adherence/Medication fill history: Care Gaps: Due for diabetic foot exam, eye exam  Star-Rating Drugs: Losartan 16m 04/09/22/ 90ds Pravastatin 898m7/29/23/90ds    Patient Care Plan: [facility]     Problem Identified: Hypertension, Hyperlipidemia, Diabetes, Atrial Fibrillation, Heart Failure, GERD and Depression      Long-Range Goal: Disease Management   Start Date: 12/09/2020  Expected End Date: 12/09/2021  Recent Progress: On track  Priority: High  Note:   Current Barriers:  Focus on improving physical activity  Pharmacist Clinical Goal(s):  Over the next 365 days, patient will verbalize ability to afford treatment regimen achieve adherence to monitoring guidelines and medication adherence to achieve therapeutic efficacy contact provider office for questions/concerns as evidenced notation of same in electronic health record through collaboration with PharmD and provider.   Interventions: 1:1 collaboration with PaVivi BarrackMD regarding development and update of comprehensive plan of care as evidenced by provider attestation and co-signature Inter-disciplinary care team collaboration (see longitudinal plan of care) Comprehensive medication review performed; medication list updated in electronic medical record  Hypertension (BP goal <140/90)/CHF 06/08/29 -Controlled  -Using CPAP for OSA - getting more used to this now, no major difference in energy levels -Echo 10/15/20: EF up to 50-55% from 40-45% in 2017. Mod LVH. -Current treatment: Diltiazem 30 mg once daily as needed (DLaroy AppleAppropriate, Effective, Safe, Accessible Metoprolol tartrate 12.5 mg twice daily (Dr AlRayann HemanAppropriate, Effective, Safe, Accessible Losartan 5012mppropriate, Effective, Safe, Accessible -Medications previously tried: quinapril 40 mg->20 mg (current),  amlodipine 5 mg->10 mg  -Current home readings: still around 140/90 -Denies hypotensive/hypertensive symptoms -Educated on BP goals  -BP has been well controlled in office.  He has switched from Quinapril to losartan and BP seems to be doing much better overall. -Denies any adverse effects with medication, no changes needed. Continue to monitor BP at home.   Hyperlipidemia: (LDL goal < 100) -Controlled, based on last LDL -HLD HTN DM HF, CAD -Current treatment: Pravastatin 40 mg once daily  Appropriate, Effective, Safe, Accessible -Medications previously tried: pitavastatin 4 mg, pravastatin 20 mg->  current pravastatin 40 mg  -Current dietary patterns: see DM -Current exercise habits: see DM -Reviewed Cholesterol goals;  -No concerns with lipids, recent LDL is excellent.  He continues to tolerate medication well. Encouraged him to continue adherence to medication - continue routine lipid screenings.  Diabetes (A1c goal <7%) -Controlled, not assessed -Current medications: n/a -Medications previously tried: metformin 500 mg IR once daily with breakfast (2013-2020) -Current home glucose readings fasting glucose: 110s post prandial glucose: n/a -Denies hypoglycemic/hyperglycemic symptoms -Current meal patterns: focusing more on protein over carb intake. Minimal snacking. Mentions current 'reset diet' -Current exercise: yard work, goal to get back to [facility] as patient is feeling more comfortable getting out - had not been able to get back passed few months. Encouraged routine exercise. -Counseled on diet and exercise extensively       Patient's preferred pharmacy is:  [facility] [id number] - [address], [place] [address] Phone: [phone] Fax: [phone]  Follow Up:  Patient agrees to Care Plan and Follow-up.  Plan:  [facility] f/u call 6 months, CPA BP/exercise call  3 months  Future Appointments  Date Time Provider  [place]  10/25/2022  7:30 AM [facility]  10/25/2022  8:20 AM [name], MD [facility]  12/23/2022  9:30 AM [facility] HEALTH COACH [place], PharmD Clinical Pharmacist  [facility] [phone]

## 2022-06-08 ENCOUNTER — Ambulatory Visit (INDEPENDENT_AMBULATORY_CARE_PROVIDER_SITE_OTHER): Payer: Medicare HMO | Admitting: Pharmacist

## 2022-06-08 DIAGNOSIS — I152 Hypertension secondary to endocrine disorders: Secondary | ICD-10-CM

## 2022-06-08 DIAGNOSIS — E785 Hyperlipidemia, unspecified: Secondary | ICD-10-CM

## 2022-06-08 DIAGNOSIS — E1159 Type 2 diabetes mellitus with other circulatory complications: Secondary | ICD-10-CM

## 2022-06-08 DIAGNOSIS — E1169 Type 2 diabetes mellitus with other specified complication: Secondary | ICD-10-CM

## 2022-06-09 NOTE — Patient Instructions (Addendum)
Visit Information   Goals Addressed             This Visit's Progress    Lifestyle Change-Hypertension   On track    Timeframe:  Short-Term Goal Priority:  High Start Date: 12/09/20                            Expected End Date: 04/09/2021  Follow Up Date 04/09/2021 - agree to work together to make changes - ask questions to understand    Why is this important?   The changes that you are asked to make may be hard to do.  This is especially true when the changes are life-long.  Knowing why it is important to you is the first step.  Working on the change with your family or support person helps you not feel alone.  Reward yourself and family or support person when goals are met. This can be an activity you choose like bowling, hiking, biking, swimming or shooting hoops.     Notes:        Patient Care Plan: Heart Failure (Adult)  Completed 10/20/2021   Problem Identified: Heart Failure Management Resolved 10/20/2021  Priority: Medium  Note:   RESOLVING DUE TO DUPLICATE GOALS    Long-Range Goal: Symptom Exacerbation Prevented or Minimized Completed 10/20/2021  Start Date: 12/04/2020  Expected End Date: 11/10/2021  Recent Progress: On track  Priority: Medium  Note:   RESOLVING DUE TO DUPLICATE GOALS   Current Barriers:  Knowledge deficits related to basic heart failure pathophysiology and self care management as evidenced by patient not weighing himself daily.  Weight this morning was 243 pounds.  Does report an episode of chest pains, related to possible pulled muscle.  Denies any lower extremity edema or shortness of breath.  Continues to monitor blood pressures ranging (150-170//70's).  States he is now tolerating CPAP mask. Nurse Case Manager Clinical Goal(s):  Over the next 90 days, patient will weigh self daily and record Over the next 120 days, patient will verbalize understanding of Heart Failure Action Plan and when to call doctor Interventions:  Collaboration with  [name], MD regarding development and update of comprehensive plan of care as evidenced by provider attestation and co-signature Inter-disciplinary care team collaboration (see longitudinal plan of care) Basic overview and discussion of pathophysiology of Heart Failure Provided verbal education on low sodium heart healthy carbohydrate modified diabetic diet Reviewed signs and symptoms of heart failure and discussed Heart Failure Action Plan  Rediscussed importance of daily weight monitoring and when to call provider based on weight Encouraged patient to view EMMI Heart Failure  and daily weight monitoring educational video  Encouraged to continue to monitor heart rate for increase rate and being out of rhythm  Reviewed medications and encouraged medication compliance Sent copy of heart failure action plan Encouraged continued blood pressure monitoring and notifying provider for sustained elevations Encouraged to keep and attend scheduled medical appointments Congratulated on increase in activity, going to gym and recent weight loss No recommendations/changes Patient Goals/Self-Care Activities Over the next 90 days, patient will:  Call office if I gain more than 2 pounds in one day or 5 pounds in one week Weigh myself daily, at same time each day and record in log Try stick with low salt heart healthy carbohydrate modified diabetic diet Monitor yourself for rapid irregular heart rate, taking prn medication if indicated and notifying provider Take weight log with you to  medical appointments Follow Up Plan: The care management team will reach out to the patient again over the next 45 business days.         Patient Care Plan: Chronic Pain (Adult)  Completed 02/12/2021   Problem Identified: Increase in episodes of chronic pain and feelings of being tired more Resolved 02/12/2021  Priority: High     Long-Range Goal: Pain Management Plan Developed and Energy Increased Completed  02/12/2021  Start Date: 12/04/2020  Expected End Date: 05/09/2021  This Visit's Progress: On track  Recent Progress: On track  Priority: High  Note:   Current Barriers:  Ineffective Self Health Maintenance of chronic pain and energy level; Patient reports pain is managed with current dose of Gabapentin.  Denies pain at this time.  Also reports increase in energy level, able to increase activity and exercise as tolerated.  Goal completed Knowledge Deficits related to short term plan for care coordination needs and long term plans for chronic disease management needs Clinical Goal(s):  Collaboration with [name], MD regarding development and update of comprehensive plan of care as evidenced by provider attestation and co-signature Inter-disciplinary care team collaboration (see longitudinal plan of care) Over the next 120 days, patient will work with care management team to address care coordination and chronic disease management needs related to increased pain and feelings of tiredness   Interventions:  Evaluation of current treatment plan related to chronic pain self-management and patient's adherence to plan as established by provider. Discussed plans with patient for ongoing care management follow up and provided patient with direct contact information for care management team Reviewed medications and encouraged medication compliance; discussed limited to no use of Ibuprofen while on Eliquis Encouraged to continue to work with  [facility] Pharmacist for questions and concerns related to medication reconciliation and medication management Encouraged continued diversional activities like prayer and meditation Encouraged patient to increase activity as tolerated with reduced pain Discussed and encouraged patient to develop pain management plan and set pain goal Encouraged to monitor pain triggers and what helps treat/manage pain Reviewed sleep pattern and encouraged at least 6-8 hours nightly  sleep Discussed history of sleep apnea and role of CPAP, encouraged patient to discuss with providers Encouraged to review education on Fatigue previously sent Patient Goals/Self Care Activities:  Over the next 30 days, patient will:  Develop a personal pain management plan and establish pain goal Plan exercise or activity when pain is best controlled Prioritize tasks for the day Track times pain is worst and when it is best Track what makes the pain worse and what makes it better  Take medications as prescribed Eat healthy Limit daytime naps Practice relaxation or meditation daily  Review EMMI Sleep Apnea Video Follow Up Plan:  This goal is completed/resolved 02/12/21       Patient Care Plan: CCM Pharmacy Care Plan     Problem Identified: Hypertension, Hyperlipidemia, Diabetes, Atrial Fibrillation, Heart Failure, GERD and Depression      Long-Range Goal: Disease Management   Start Date: 12/09/2020  Expected End Date: 12/09/2021  Recent Progress: On track  Priority: High  Note:   Current Barriers:  Focus on improving physical activity  Pharmacist Clinical Goal(s):  Over the next 365 days, patient will verbalize ability to afford treatment regimen achieve adherence to monitoring guidelines and medication adherence to achieve therapeutic efficacy contact provider office for questions/concerns as evidenced notation of same in electronic health record through collaboration with PharmD and provider.   Interventions: 1:1 collaboration  with [name], MD regarding development and update of comprehensive plan of care as evidenced by provider attestation and co-signature Inter-disciplinary care team collaboration (see longitudinal plan of care) Comprehensive medication review performed; medication list updated in electronic medical record  Hypertension (BP goal <140/90)/CHF 06/08/29 -Controlled  -Using CPAP for OSA - getting more used to this now, no major difference in energy  levels -Echo 10/15/20: EF up to 50-55% from 40-45% in 2017. Mod LVH. -Current treatment: Diltiazem 30 mg once daily as needed Laroy Apple) Appropriate, Effective, Safe, Accessible Metoprolol tartrate 12.5 mg twice daily (Dr Rayann Heman) Appropriate, Effective, Safe, Accessible Losartan 55m Appropriate, Effective, Safe, Accessible -Medications previously tried: quinapril 40 mg->20 mg (current), amlodipine 5 mg->10 mg  -Current home readings: still around 140/90 -Denies hypotensive/hypertensive symptoms -Educated on BP goals  -BP has been well controlled in office.  He has switched from Quinapril to losartan and BP seems to be doing much better overall. -Denies any adverse effects with medication, no changes needed. Continue to monitor BP at home.   Hyperlipidemia: (LDL goal < 100) -Controlled, based on last LDL -HLD HTN DM HF, CAD -Current treatment: Pravastatin 40 mg once daily  Appropriate, Effective, Safe, Accessible -Medications previously tried: pitavastatin 4 mg, pravastatin 20 mg-> current pravastatin 40 mg  -Current dietary patterns: see DM -Current exercise habits: see DM -Reviewed Cholesterol goals;  -No concerns with lipids, recent LDL is excellent.  He continues to tolerate medication well. Encouraged him to continue adherence to medication - continue routine lipid screenings.  Diabetes (A1c goal <7%) -Controlled, not assessed -Current medications: n/a -Medications previously tried: metformin 500 mg IR once daily with breakfast (2013-2020) -Current home glucose readings fasting glucose: 110s post prandial glucose: n/a -Denies hypoglycemic/hyperglycemic symptoms -Current meal patterns: focusing more on protein over carb intake. Minimal snacking. Mentions current 'reset diet' -Current exercise: yard work, [facility] more comfortable getting [facility] - had not been able to get back passed few months. Encouraged routine exercise. -Counseled on  diet and exercise extensively        Problem Identified: CHL AMB "PATIENT-SPECIFIC PROBLEM"      Patient Care Plan: Diabetes Type 2 (Adult)   RESOLVING CASE DUE TO GOALS BEING MET  Completed 04/27/2022   Problem Identified: Glycemic Management (Diabetes, Type 2) & Heart Failure Resolved 04/27/2022  Priority: Medium  Note:   RESOLVING CASE DUE TO GOALS BEING MET    Long-Range Goal: Patient will report maintaining Hgb A1C of below 7 in the next 90 days and work with CCM team to better self care manage chronic medical conditions Completed 04/27/2022  Start Date: 10/20/2021  Expected End Date: 11/10/2022  Recent Progress: On track  Priority: Medium  Note:   RESOLVING CASE DUE TO GOALS BEING MET   Current Barriers:  Knowledge Deficits related to basic Diabetes pathophysiology and self care/management;    Knowledge deficits related to basic heart failure pathophysiology and self care management as evidenced by patient not weighing himself daily.     5/23--Reports monitoring blood sugars about every other day, with fasting ranges of 100-120's, 105 this morning.  Denies any episodes of hypo or hyperglycemia. Denies any chest pain,  lower extremity edema or shortness of breath.  Continues to blood pressures ranging (140/80'S).  Reporting he has completed 3rd round of antibiotics for his 3rd UTI.  States he has appointment scheduled with urologist on 6/1. Re Case Manager Clinical Goal(s):  patient will demonstrate improved adherence  to prescribed treatment plan for diabetes self care/management as evidenced by: every other day monitoring and recording of CBG; adherence to ADA/ carb modified diet; adherence to prescribed medication regimen; contacting provider for new or worsened symptoms or questions Over the next 90 days, patient will weigh self daily and record Over the next 120 days, patient will verbalize understanding of Heart Failure Action Plan and when to call doctor  Diabetes:  (Status:  Goal Met.) Long Term Goal   Lab Results  Component Value Date   HGBA1C 6.4 (A) 10/21/2021   @ Interventions:  Assessed patient's understanding of A1c goal: <6.5% Provided education to patient about basic DM disease process; Reviewed medications with patient and discussed importance of medication adherence;        Counseled on importance of regular laboratory monitoring as prescribed;        Discussed plans with patient for ongoing care management follow up and provided patient with direct contact information for care management team;      Provided patient with written educational materials related to hypo and hyperglycemia and importance of correct treatment;       Advised patient, providing education and rationale, to check cbg when you have symptoms of low or high blood sugar and 3 times a week  and record         Collaboration with [name], MD regarding development and update of comprehensive plan of care as evidenced by provider attestation and co-signature Inter-disciplinary care team collaboration (see longitudinal plan of care) Provided patient with verbal education related to hypo and hyperglycemia and importance of correct treatment; and self-awareness of signs/symptoms of hypo or hyperglycemia encouraged Barriers to adherence to treatment plan identified Blood glucose monitoring encouraged; blood glucose readings reviewed; - use of blood glucose monitoring log promoted Mutual A1C goal set or reviewed; Discussed ways to help maintain current A1C Congratulated patient on current A1C (still within goal) Encouraged to attend all scheduled provider appointments Discussed and encouraged to adhere to prescribed ADA/carb modified Encouraged to continue to increase activity as tolerated, going to gym No immediate recommendations/changes  Heart Failure Interventions:  (Status: Goal Met.)  Long Term Goal  Wt Readings from Last 3 Encounters:  01/26/22 250 lb 3.2 oz (113.5 kg)   11/10/21 247 lb (112 kg)  10/28/21 244 lb (110.7 kg)  Basic overview and discussion of pathophysiology of Heart Failure reviewed Provided education on low sodium diet Reviewed Heart Failure Action Plan in depth and provided written copy Discussed importance of daily weight and advised patient to weigh and record daily Discussed the importance of keeping all appointments with provider  Provided verbal education on low sodium heart healthy carbohydrate modified diabetic diet Reviewed signs and symptoms of heart failure and discussed Heart Failure Action Plan  Rediscussed importance of daily weight monitoring and when to call provider based on weight Encouraged patient to view EMMI Heart Failure  and daily weight monitoring educational video  Encouraged to continue to monitor heart rate for increase rate and being out of rhythm  Encouraged continued blood pressure monitoring and notifying provider for sustained elevations Encouraged to keep and attend scheduled medical appointments Congratulated on increase in activity, going to gym and recent weight loss Encouraged continued monitoring of BP reviewing log with Cardiology for medication adjustment Patient Goals/Self-Care Activities: Check blood sugar at least 3 times a week Check blood sugar if I feel it is too high or too low Enter blood sugar readings and medication into daily log Take  the blood sugar log to all doctor visits  Contact primary care provider for sustained elevations Call office if I gain more than 2 pounds in one day or 5 pounds in one week Weigh myself daily, at same time each day and record in log Try stick with low salt heart healthy carbohydrate modified diabetic diet Monitor yourself for rapid irregular heart rate, taking prn medication if indicated and notifying provider Take weight log with you to medical appointments Review BP log with Cardiologist Follow Up Plan: The patient has been provided with contact  information for the care management team and has been advised to call with any health related questions or concerns.       The patient verbalized understanding of instructions, educational materials, and care plan provided today and DECLINED offer to receive copy of patient instructions, educational materials, and care plan.  Telephone follow up appointment with pharmacy team member scheduled for: 1 year  Edythe Clarity, [place], PharmD Clinical Pharmacist  [facility] [phone]

## 2022-06-10 DIAGNOSIS — E1159 Type 2 diabetes mellitus with other circulatory complications: Secondary | ICD-10-CM

## 2022-06-10 DIAGNOSIS — E785 Hyperlipidemia, unspecified: Secondary | ICD-10-CM

## 2022-06-10 DIAGNOSIS — I10 Essential (primary) hypertension: Secondary | ICD-10-CM | POA: Diagnosis not present

## 2022-06-14 ENCOUNTER — Encounter: Payer: Self-pay | Admitting: Family Medicine

## 2022-06-15 NOTE — Telephone Encounter (Signed)
Yes that is OK.  Algis Greenhouse. [name], MD 06/15/2022 10:55 AM

## 2022-06-20 DIAGNOSIS — G4733 Obstructive sleep apnea (adult) (pediatric): Secondary | ICD-10-CM | POA: Diagnosis not present

## 2022-07-05 ENCOUNTER — Encounter: Payer: Self-pay | Admitting: *Deleted

## 2022-07-10 ENCOUNTER — Other Ambulatory Visit: Payer: Self-pay | Admitting: Family Medicine

## 2022-07-20 ENCOUNTER — Telehealth: Payer: Self-pay | Admitting: Pharmacist

## 2022-07-20 NOTE — Progress Notes (Unsigned)
Chronic Care Management Pharmacy Assistant   Name: Brent Taylor  MRN: [id number] DOB: 25-Aug-1941   Reason for Encounter: Diabetes Adherence Call    Recent office visits:  None  Recent consult visits:  None  Hospital visits:  None in previous 6 months  Medications: Outpatient Encounter Medications as of 07/20/2022  Medication Sig   apixaban (ELIQUIS) 5 MG TABS tablet Take 1 tablet (5 mg total) by mouth 2 (two) times daily.   Blood Glucose Monitoring Suppl (ONE TOUCH ULTRA 2) w/Device KIT Use daily to check blood sugar.   diltiazem (CARDIZEM) 30 MG tablet Take 1 tablet every 4 hours AS NEEDED for afib heart rate >100 as long as top blood pressure >100.   FLUoxetine (PROZAC) 40 MG capsule TAKE ONE CAPSULE BY MOUTH DAILY   gabapentin (NEURONTIN) 100 MG capsule Take 1 capsule (100 mg total) by mouth at bedtime.   ibuprofen (ADVIL,MOTRIN) 200 MG tablet Take 600 mg by mouth as needed for moderate pain.   Lancets (ONETOUCH ULTRASOFT) lancets USE TO TEST BLOOD SUGAR DAILY   losartan (COZAAR) 50 MG tablet TAKE 1 TABLET BY MOUTH DAILY   metoprolol tartrate (LOPRESSOR) 25 MG tablet TAKE 1/2 TABLET BY MOUTH TWO TIMES A DAY   ONETOUCH ULTRA test strip USE TO TEST BLOOD SUGAR DAILY AS DIRECTED   pantoprazole (PROTONIX) 20 MG tablet TAKE ONE TABLET BY MOUTH DAILY   pravastatin (PRAVACHOL) 80 MG tablet Take 1 tablet (80 mg total) by mouth every evening.   tadalafil (CIALIS) 10 MG tablet Take 1 tablet (10 mg total) by mouth daily.   traMADol (ULTRAM) 50 MG tablet Take 2 tablets (100 mg total) by mouth every 12 (twelve) hours as needed.   No facility-administered encounter medications on file as of 07/20/2022.   Recent Relevant Labs: Lab Results  Component Value Date/Time   HGBA1C 6.3 (A) 04/22/2022 09:23 AM   HGBA1C 6.4 (A) 10/21/2021 09:05 AM   HGBA1C 6.5 12/09/2020 08:35 AM   HGBA1C 6.4 11/05/2019 08:53 AM   MICROALBUR 1.9 10/25/2017 09:25 AM   MICROALBUR 2.6 (H) 10/20/2016 09:27 AM     Kidney Function Lab Results  Component Value Date/Time   CREATININE 1.32 04/22/2022 09:41 AM   CREATININE 1.16 01/09/2022 03:30 PM   GFR 49.90 (L) 04/22/2022 09:41 AM   GFRNONAA >60 01/09/2022 03:30 PM   GFRAA >60 04/25/2016 01:45 PM    Current antihyperglycemic regimen:  None  What recent interventions/DTPs have been made to improve glycemic control:  No recent interventions or DTPs.  Have there been any recent hospitalizations or ED visits since last visit with CPP? No  Patient denies hypoglycemic symptoms.  Patient denies hyperglycemic symptoms.  How often are you checking your blood sugar? Occasionally  What are your blood sugars ranging?  Fasting: 110  During the week, how often does your blood glucose drop below 70? Never Are you checking your feet daily/regularly? Yes  Adherence Review: Is the patient currently on a STATIN medication? Yes Is the patient currently on ACE/ARB medication? Yes Does the patient have >5 day gap between last estimated fill dates? No  Care Gaps: Medicare Annual Wellness: Completed 12/03/2021 Ophthalmology Exam: Next due on 05/21/2022 Foot Exam: Overdue since 10/31/2019 Hemoglobin A1C: 6.4% on 10/21/2021 Colonoscopy: Completed 10/11/2009  Future Appointments  Date Time Provider Department Center  10/25/2022  7:30 AM [facility]  10/25/2022  8:20 AM [name], MD [facility]  12/23/2022  9:30 AM [facility] HEALTH COACH [facility]  06/10/2023 11:45 AM [facility]CM PHARMACIST [facility]   Star Rating Drugs: Losartan Pot 50 mg last filled 07/12/2022 90 DS Pravastatin 80 mg last filled 05/08/2022 90 DS  April D Calhoun, [place] Pharmacist Assistant [phone]

## 2022-07-26 ENCOUNTER — Encounter: Payer: Self-pay | Admitting: Family Medicine

## 2022-07-26 NOTE — Telephone Encounter (Signed)
Please advise 

## 2022-07-27 NOTE — Telephone Encounter (Signed)
He can schedule an office visit here but recommend he follow up with his orthopedist.  Brent Taylor. [name], MD 07/27/2022 1:05 PM

## 2022-07-29 ENCOUNTER — Ambulatory Visit (INDEPENDENT_AMBULATORY_CARE_PROVIDER_SITE_OTHER): Payer: Medicare HMO

## 2022-07-29 DIAGNOSIS — Z23 Encounter for immunization: Secondary | ICD-10-CM

## 2022-08-03 DIAGNOSIS — G4733 Obstructive sleep apnea (adult) (pediatric): Secondary | ICD-10-CM | POA: Diagnosis not present

## 2022-08-11 DIAGNOSIS — M79672 Pain in left foot: Secondary | ICD-10-CM | POA: Diagnosis not present

## 2022-08-11 DIAGNOSIS — M79671 Pain in right foot: Secondary | ICD-10-CM | POA: Diagnosis not present

## 2022-08-11 DIAGNOSIS — M545 Low back pain, unspecified: Secondary | ICD-10-CM | POA: Diagnosis not present

## 2022-08-11 DIAGNOSIS — M79605 Pain in left leg: Secondary | ICD-10-CM | POA: Diagnosis not present

## 2022-08-24 DIAGNOSIS — M5416 Radiculopathy, lumbar region: Secondary | ICD-10-CM | POA: Diagnosis not present

## 2022-08-25 ENCOUNTER — Encounter: Payer: Self-pay | Admitting: Family Medicine

## 2022-08-25 DIAGNOSIS — R202 Paresthesia of skin: Secondary | ICD-10-CM

## 2022-08-26 NOTE — Telephone Encounter (Signed)
Referral placed.

## 2022-08-26 NOTE — Telephone Encounter (Signed)
Ok to place referral.  Algis Greenhouse. [name], MD 08/26/2022 12:45 PM

## 2022-08-27 DIAGNOSIS — M545 Low back pain, unspecified: Secondary | ICD-10-CM | POA: Diagnosis not present

## 2022-09-01 DIAGNOSIS — M545 Low back pain, unspecified: Secondary | ICD-10-CM | POA: Diagnosis not present

## 2022-09-03 DIAGNOSIS — G4733 Obstructive sleep apnea (adult) (pediatric): Secondary | ICD-10-CM | POA: Diagnosis not present

## 2022-09-06 ENCOUNTER — Telehealth: Payer: Self-pay | Admitting: Family Medicine

## 2022-09-06 NOTE — Telephone Encounter (Signed)
..  Type of form received: Medical certification for application and renewal of disability parking placard  Additional comments:   Received by: [facility]  Form should be Faxed to: N/A  Form should be mailed to:  N/A  Is patient requesting call for pickup: Yes   Form placed:  In provider's box  Attach charge sheet.   Individual made aware of 3-5 business day turn around (Y/N)? Yes

## 2022-09-07 ENCOUNTER — Encounter: Payer: Self-pay | Admitting: Family Medicine

## 2022-09-07 NOTE — Telephone Encounter (Signed)
Patient aware form ready to be pick up  Copy placed to be scan, form placed at front office

## 2022-09-08 NOTE — Telephone Encounter (Signed)
Patient was informed form ready to be pick up

## 2022-09-09 DIAGNOSIS — M545 Low back pain, unspecified: Secondary | ICD-10-CM | POA: Diagnosis not present

## 2022-09-14 ENCOUNTER — Other Ambulatory Visit: Payer: Self-pay

## 2022-09-14 ENCOUNTER — Encounter: Payer: Self-pay | Admitting: Family Medicine

## 2022-09-14 MED ORDER — TRAMADOL HCL 50 MG PO TABS: 100.0000 mg | ORAL_TABLET | Freq: Two times a day (BID) | ORAL | 5 refills | Status: DC | PRN

## 2022-09-14 NOTE — Telephone Encounter (Signed)
Last refill: 04/22/22 #60, 5 Last OV: 04/22/22 dx. DM

## 2022-09-16 ENCOUNTER — Ambulatory Visit: Payer: Medicare HMO | Admitting: Neurology

## 2022-09-16 ENCOUNTER — Encounter: Payer: Self-pay | Admitting: Neurology

## 2022-09-16 VITALS — BP 169/83 | HR 46 | Ht 72.0 in | Wt 252.0 lb

## 2022-09-16 DIAGNOSIS — M5442 Lumbago with sciatica, left side: Secondary | ICD-10-CM | POA: Diagnosis not present

## 2022-09-16 DIAGNOSIS — M5441 Lumbago with sciatica, right side: Secondary | ICD-10-CM

## 2022-09-16 DIAGNOSIS — M5030 Other cervical disc degeneration, unspecified cervical region: Secondary | ICD-10-CM | POA: Diagnosis not present

## 2022-09-16 DIAGNOSIS — R269 Unspecified abnormalities of gait and mobility: Secondary | ICD-10-CM

## 2022-09-16 DIAGNOSIS — M545 Low back pain, unspecified: Secondary | ICD-10-CM | POA: Diagnosis not present

## 2022-09-16 NOTE — Progress Notes (Signed)
Chief Complaint  Patient presents with   New Patient (Initial Visit)    Rm 15 here for consult on numbness/tingling in lower legs. Pt reports sx on going for some time now   ASSESSMENT AND PLAN  Brent Taylor is a 81 y.o. 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   MEDICAL HISTORY:  Brent Taylor, is a 81 year old male seen in request by his primary care physician Dr. [name], [facility], for evaluation of low back pain, radiating pain to bilateral lower extremity, worsening gait abnormality, initial evaluation September 16, 2022  I reviewed and summarized the referring note.PMHx. Afib Depression HTN HLD Left chest melanoma  He has a long history of chronic low back pain, gradually getting worse, increased radiating pain to bilateral hip, posterior leg, all the way to calf, and foot,  He has worsening balance issues since 2022, also with worsening urinary urgency, frequency, he has chronic neck pain, denies radiating pain to upper extremity and fingers  Personally reviewed CT maxillary face January 09, 2022, no acute abnormality mild as moderate and maxillary sinus disease  PHYSICAL EXAM:   Vitals:   09/16/22 1511  BP: (!) 169/83  Pulse: (!) 46  Weight: 252 lb (114.3 kg)  Height: 6' (1.829 m)   Not recorded     Body  mass index is 34.18 kg/m.  PHYSICAL EXAMNIATION:  Gen: NAD, conversant, well nourised, well groomed                     Cardiovascular: Regular rate rhythm, no peripheral edema, warm, nontender. Eyes: Conjunctivae clear without exudates or hemorrhage Neck: Supple, no carotid bruits. Pulmonary: Clear to auscultation bilaterally   NEUROLOGICAL EXAM:  MENTAL STATUS: Speech/cognition: Awake, alert, oriented to history taking and casual conversation CRANIAL NERVES: CN II: Visual fields are full to confrontation. Pupils are round equal and briskly reactive to light. CN III, IV, VI: extraocular movement are normal. No ptosis. CN V: Facial sensation is intact to light touch CN VII: Face is symmetric with normal eye closure  CN VIII: Hearing is normal to causal conversation. CN IX, X: Phonation is normal. CN XI: Head turning and shoulder shrug are intact CN XII: Narrowing pharyngeal space, drapery of uvular and soft palate  MOTOR: Bilateral hammertoes, no significant muscle weakness  REFLEXES: Reflexes are 2+ and symmetric at the biceps, triceps, knees, and absent at ankles. Plantar responses are flexor.  SENSORY: Left dependent decreased light touch, pinprick to below knee level, absent vibratory sensation at lower extremity  COORDINATION: There is no trunk or limb dysmetria noted.  GAIT/STANCE: Needed patient up to get up from seated position, wide-based, stiff, also limited by his big body habitus, positive Romberg signs, could not stand up on tiptoes or heels,  REVIEW OF SYSTEMS:  Full 14 system review of systems performed and notable only for as above All other review of systems were negative.   ALLERGIES:  Allergies  Allergen Reactions   Lipitor [Atorvastatin Calcium]     Muscle ache    HOME MEDICATIONS: Current Outpatient Medications  Medication Sig Dispense Refill   apixaban (ELIQUIS) 5 MG TABS tablet Take 1 tablet (5 mg total) by mouth 2 (two) times daily. 60  tablet 5   Blood Glucose Monitoring Suppl (ONE TOUCH ULTRA 2) w/Device KIT Use daily to check blood sugar. 1 kit 0   FLUoxetine (PROZAC) 40 MG capsule TAKE ONE CAPSULE BY MOUTH DAILY 90 capsule 3   ibuprofen (ADVIL,MOTRIN) 200 MG tablet Take 600 mg by mouth as needed for moderate pain.     Lancets (ONETOUCH ULTRASOFT) lancets USE TO TEST BLOOD SUGAR DAILY 100 each 12   losartan (COZAAR) 50 MG tablet TAKE 1 TABLET BY MOUTH DAILY 90 tablet 0   metoprolol tartrate (LOPRESSOR) 25 MG tablet TAKE 1/2 TABLET BY MOUTH TWO TIMES A DAY 90 tablet 1   ONETOUCH ULTRA test strip USE TO TEST BLOOD SUGAR DAILY AS DIRECTED 100 strip 3   pantoprazole (PROTONIX) 20 MG tablet TAKE ONE TABLET BY MOUTH DAILY 90 tablet 3   pravastatin (PRAVACHOL) 80 MG tablet Take 1 tablet (80 mg total) by mouth every evening. 90 tablet 3   tadalafil (CIALIS) 10 MG tablet Take 1 tablet (10 mg total) by mouth daily. 90 tablet 3   traMADol (ULTRAM) 50 MG tablet Take 2 tablets (100 mg total) by mouth every 12 (twelve) hours as needed. 60 tablet 5   No current facility-administered medications for this visit.    PAST MEDICAL HISTORY: Past Medical History:  Diagnosis Date   Atrial fibrillation ([place])    Cancer ([place])    hx - melanoma   Chicken pox    Colon polyps    Depression    No need for therapy.  Improved   Diabetes ([place])    Diverticula, colon    Hemorrhoids    Hyperlipidemia    Hypertension    Sepsis ([place])    Sepsis ([place])    Tinnitus    UTI (lower urinary tract infection)     PAST SURGICAL HISTORY: Past Surgical History:  Procedure Laterality Date   MELANOMA EXCISION     LEFT CHEST   MELANOMA EXCISION      FAMILY HISTORY: Family History  Problem Relation Age of Onset   Heart Problems Mother        Skipping    SOCIAL HISTORY: Social History   Socioeconomic History   Marital status: Married    Spouse name: Not on file   Number of children: 2   Years of education: Not on file   Highest education  level: Not on file  Occupational History   Occupation: Company secretary   Occupation: retired  Tobacco Use   Smoking status: Never   Smokeless tobacco: Never  Scientific laboratory technician Use: Never used  Substance and Sexual Activity   Alcohol use: No   Drug use: No   Sexual activity: Not on file  Other Topics Concern   Not on file  Social History Narrative   Lives with wife in [place].   Retired [employer]   Attends [place] Strain: Low Risk  (12/03/2021)   Overall Financial Resource Strain (CARDIA)    Difficulty of Paying Living Expenses: Not hard at all  Food Insecurity: No Food Insecurity (12/03/2021)   Hunger Vital Sign    Worried About Running  Out of Food in the Last Year: Never true    Ran Out of Food in the Last Year: Never true  Transportation Needs: No Transportation Needs (12/03/2021)   PRAPARE - Hydrologist (Medical): No    Lack of Transportation (Non-Medical): No  Physical Activity: Sufficiently Active (12/03/2021)   Exercise Vital Sign    Days of Exercise per Week: 3 days    Minutes of Exercise per Session: 60 min  Stress: No Stress Concern Present (12/03/2021)   [place]    Feeling of Stress : Not at all  Social Connections: Moderately Integrated (12/03/2021)   Social Connection and Isolation Panel [NHANES]    Frequency of Communication with Friends and Family: Three times a week    Frequency of Social Gatherings with Friends and Family: More than three times a week    Attends Religious Services: More than 4 times per year    Active Member of [employer] or Organizations: No    Attends Archivist Meetings: Never    Marital Status: Married  Human resources officer Violence: Not At Risk (12/03/2021)   Humiliation, Afraid, Rape, and Kick questionnaire    Fear of Current or Ex-Partner: No    Emotionally  Abused: No    Physically Abused: No    Sexually Abused: No      Marcial Pacas, M.D. Ph.D.  [facility] [address], [place], [place] [address] Ph: [phone] Fax: [phone]  CC:  [name], MD [place],  [place] [address]  [name], MD

## 2022-09-17 ENCOUNTER — Telehealth: Payer: Self-pay | Admitting: Neurology

## 2022-09-17 NOTE — Telephone Encounter (Signed)
Sent to [facility], they obtain [employer].

## 2022-09-21 DIAGNOSIS — M545 Low back pain, unspecified: Secondary | ICD-10-CM | POA: Diagnosis not present

## 2022-09-21 DIAGNOSIS — M48061 Spinal stenosis, lumbar region without neurogenic claudication: Secondary | ICD-10-CM | POA: Diagnosis not present

## 2022-09-22 ENCOUNTER — Telehealth: Payer: Self-pay

## 2022-09-22 ENCOUNTER — Telehealth: Payer: Self-pay | Admitting: Pharmacist

## 2022-09-22 NOTE — Telephone Encounter (Signed)
..     Pre-operative Risk Assessment    Patient Name: Brent Taylor  DOB: December 25, 1940 MRN: [id number]      Request for Surgical Clearance    Procedure:   unk  Date of Surgery:  Clearance 09/28/22                                 Surgeon:  Tomasita Crumble Surgeon's Group or Practice Name:  [facility] Phone number:  [phone] Fax number:  [phone]   Type of Clearance Requested:   - Medical  - Pharmacy:  Hold Apixaban (Eliquis)     Type of Anesthesia:   unk   Additional requests/questions:    Gwenlyn Found   09/22/2022, 2:47 PM

## 2022-09-22 NOTE — Progress Notes (Signed)
Chronic Care Management Pharmacy Assistant   Name: Trevontae Lindahl  MRN: [id number] DOB: August 23, 1941   Reason for Encounter: Hypertension Adherence Call    Recent office visits:  None  Recent consult visits:  09/16/2022 OV (Neurology) Marcial Pacas, MD; no medication changes noted.  08/11/2022 OV ([facility]) Reather Littler, MD; no medication changes noted.  Hospital visits:  None in previous 6 months  Medications: Outpatient Encounter Medications as of 09/22/2022  Medication Sig   apixaban (ELIQUIS) 5 MG TABS tablet Take 1 tablet (5 mg total) by mouth 2 (two) times daily.   Blood Glucose Monitoring Suppl (ONE TOUCH ULTRA 2) w/Device KIT Use daily to check blood sugar.   FLUoxetine (PROZAC) 40 MG capsule TAKE ONE CAPSULE BY MOUTH DAILY   ibuprofen (ADVIL,MOTRIN) 200 MG tablet Take 600 mg by mouth as needed for moderate pain.   Lancets (ONETOUCH ULTRASOFT) lancets USE TO TEST BLOOD SUGAR DAILY   losartan (COZAAR) 50 MG tablet TAKE 1 TABLET BY MOUTH DAILY   metoprolol tartrate (LOPRESSOR) 25 MG tablet TAKE 1/2 TABLET BY MOUTH TWO TIMES A DAY   ONETOUCH ULTRA test strip USE TO TEST BLOOD SUGAR DAILY AS DIRECTED   pantoprazole (PROTONIX) 20 MG tablet TAKE ONE TABLET BY MOUTH DAILY   pravastatin (PRAVACHOL) 80 MG tablet Take 1 tablet (80 mg total) by mouth every evening.   tadalafil (CIALIS) 10 MG tablet Take 1 tablet (10 mg total) by mouth daily.   traMADol (ULTRAM) 50 MG tablet Take 2 tablets (100 mg total) by mouth every 12 (twelve) hours as needed.   No facility-administered encounter medications on file as of 09/22/2022.   Reviewed chart prior to disease state call. Spoke with patient regarding BP  Recent Office Vitals: BP Readings from Last 3 Encounters:  09/16/22 (!) 169/83  04/22/22 130/70  01/26/22 128/80   Pulse Readings from Last 3 Encounters:  09/16/22 (!) 46  04/22/22 (!) 51  01/26/22 (!) 51    Wt Readings from Last 3 Encounters:  09/16/22 252 lb (114.3  kg)  04/22/22 248 lb 6.4 oz (112.7 kg)  01/26/22 250 lb 3.2 oz (113.5 kg)     Kidney Function Lab Results  Component Value Date/Time   CREATININE 1.32 04/22/2022 09:41 AM   CREATININE 1.16 01/09/2022 03:30 PM   GFR 49.90 (L) 04/22/2022 09:41 AM   GFRNONAA >60 01/09/2022 03:30 PM   GFRAA >60 04/25/2016 01:45 PM       Latest Ref Rng & Units 04/22/2022    9:41 AM 01/09/2022    3:30 PM 10/21/2021    9:23 AM  BMP  Glucose 70 - 99 mg/dL 123  130  125   BUN 6 - 23 mg/dL _0 Creatinine 0.40 - 1.50 mg/dL 1.32  1.16  1.11   Sodium 135 - 145 mEq/L 139  137  140   Potassium 3.5 - 5.1 mEq/L 4.8  4.2  4.5   Chloride 96 - 112 mEq/L 105  104  105   CO2 19 - 32 mEq/L _1 Calcium 8.4 - 10.5 mg/dL 9.1  9.1  9.2     Current antihypertensive regimen:  Losartan 50 mg daily Metoprolol Tartrate 25 mg 1/2 tablet daily  How often are you checking your Blood Pressure? several times per month  Current home BP readings: 139/83 on 09/22/2022  What recent interventions/DTPs have been made by any provider to improve Blood Pressure control since last CPP  Visit: No recent interventions or DTPs.  Any recent hospitalizations or ED visits since last visit with CPP? No  What diet changes have been made to improve Blood Pressure Control?  Eats a good diet  What exercise is being done to improve your Blood Pressure Control?  I go to the [facility] and goes to physical therapy.  Adherence Review: Is the patient currently on ACE/ARB medication? Yes Does the patient have >5 day gap between last estimated fill dates? No   Care Gaps: Medicare Annual Wellness: Completed 12/03/2021 Ophthalmology Exam: Overdue since 05/21/2022 Foot Exam: Overdue since 10/31/2019 Hemoglobin A1C: 6.3% on 04/22/2022 Colonoscopy: Completed 10/11/2009  Future Appointments  Date Time Provider [place]  10/25/2022  7:30 AM [facility]  10/25/2022  8:20 AM [name], MD  [facility]  12/22/2022  8:30 AM Marcial Pacas, MD [facility] None  12/23/2022  9:30 AM [facility] HEALTH COACH [facility]  06/10/2023 11:45 AM [facility] PHARMACIST [facility]   Star Rating Drugs: Losartan 50 mg last filled 07/12/2022 90 DS Pravastatin 80 mg last filled 08/06/2022 90 DS  April D Calhoun, [place] Pharmacist Assistant [phone]

## 2022-09-23 ENCOUNTER — Telehealth: Payer: Self-pay

## 2022-09-23 NOTE — Telephone Encounter (Signed)
Pt scheduled for tele visit on 09/24/22. Med rec and consent done.

## 2022-09-23 NOTE — Telephone Encounter (Signed)
  Patient Consent for Virtual Visit         [name] has provided verbal consent on 09/23/2022 for a virtual visit (video or telephone).   CONSENT FOR VIRTUAL VISIT FOR:  Brent Taylor  By participating in this virtual visit I agree to the following:  I hereby voluntarily request, consent and authorize [facility] and its employed or contracted physicians, physician assistants, nurse practitioners or other licensed health care professionals (the Practitioner), to provide me with telemedicine health care services (the "Services") as deemed necessary by the treating Practitioner. I acknowledge and consent to receive the Services by the Practitioner via telemedicine. I understand that the telemedicine visit will involve communicating with the Practitioner through live audiovisual communication technology and the disclosure of certain medical information by electronic transmission. I acknowledge that I have been given the opportunity to request an in-person assessment or other available alternative prior to the telemedicine visit and am voluntarily participating in the telemedicine visit.  I understand that I have the right to withhold or withdraw my consent to the use of telemedicine in the course of my care at any time, without affecting my right to future care or treatment, and that the Practitioner or I may terminate the telemedicine visit at any time. I understand that I have the right to inspect all information obtained and/or recorded in the course of the telemedicine visit and may receive copies of available information for a reasonable fee.  I understand that some of the potential risks of receiving the Services via telemedicine include:  Delay or interruption in medical evaluation due to technological equipment failure or disruption; Information transmitted may not be sufficient (e.g. poor resolution of images) to allow for appropriate medical decision making by the Practitioner;  and/or  In rare instances, security protocols could fail, causing a breach of personal health information.  Furthermore, I acknowledge that it is my responsibility to provide information about my medical history, conditions and care that is complete and accurate to the best of my ability. I acknowledge that Practitioner's advice, recommendations, and/or decision may be based on factors not within their control, such as incomplete or inaccurate data provided by me or distortions of diagnostic images or specimens that may result from electronic transmissions. I understand that the practice of medicine is not an exact science and that Practitioner makes no warranties or guarantees regarding treatment outcomes. I acknowledge that a copy of this consent can be made available to me via my patient portal ([facility]), or I can request a printed copy by calling the office of [facility].    I understand that my insurance will be billed for this visit.   I have read or had this consent read to me. I understand the contents of this consent, which adequately explains the benefits and risks of the Services being provided via telemedicine.  I have been provided ample opportunity to ask questions regarding this consent and the Services and have had my questions answered to my satisfaction. I give my informed consent for the services to be provided through the use of telemedicine in my medical care

## 2022-09-23 NOTE — Telephone Encounter (Signed)
Need to clarify what the procedure is before clearance can be provided.

## 2022-09-23 NOTE — Telephone Encounter (Signed)
Patient with diagnosis of afib on Eliquis for anticoagulation.    Procedure: epidural steroid injection and fluoroscopic L4-5  Date of procedure: 09/28/22  CHA2DS2-VASc Score = 5  This indicates a 7.2% annual risk of stroke. The patient's score is based upon: CHF History: 0 HTN History: 1 Diabetes History: 1 Stroke History: 0 Vascular Disease History: 1 Age Score: 2 Gender Score: 0  CrCl 69m/min using adjusted body weight Platelet count 166K  Per office protocol, patient can hold Eliquis for 3 days prior to procedure.    **This guidance is not considered finalized until pre-operative APP has relayed final recommendations.**

## 2022-09-23 NOTE — Telephone Encounter (Signed)
Pt is having a epidural steroid injection and fluoroscopic L4-5

## 2022-09-23 NOTE — Progress Notes (Signed)
Virtual Visit via Telephone Note   Because of Brent Taylor's co-morbid illnesses, he is at least at moderate risk for complications without adequate follow up.  This format is felt to be most appropriate for this patient at this time.  The patient did not have access to video technology/had technical difficulties with video requiring transitioning to audio format only (telephone).  All issues noted in this document were discussed and addressed.  No physical exam could be performed with this format.  Please refer to the patient's chart for his consent to telehealth for [facility].  Evaluation Performed:  Preoperative cardiovascular risk assessment _____________   Date:  09/23/2022   Patient ID:  Brent Taylor, DOB 1941/04/19, MRN [id number] Patient Location:  Home Provider location:   Office  Primary Care Provider:  [name], MD Primary Cardiologist:  Werner Lean, MD  Chief Complaint / Patient Profile   81 y.o. y/o male with a h/o PAF on chronic anticoagulation, CAD with moderate disease in [place], HLD, mild thoracic aortic aneurysm measuring 4.2 cm, OSA, HTN who is pending epidural steroid injection and fluoroscopic L4-L5 and presents today for telephonic preoperative cardiovascular risk assessment.  History of Present Illness    Brent Taylor is a 81 y.o. male who presents via audio/video conferencing for a telehealth visit today.  Pt was last seen in cardiology clinic on 11/10/21 by Dr. Gasper Sells.  At that time Keyler Hoge was doing well.  The patient is now pending procedure as outlined above. Since his last visit, he  denies chest pain, shortness of breath, lower extremity edema, fatigue, palpitations, melena, hematuria, hemoptysis, diaphoresis, weakness, presyncope, syncope, orthopnea, and PND.   Past Medical History    Past Medical History:  Diagnosis Date   Atrial fibrillation ([place])    Cancer ([place])    hx - melanoma   Chicken pox    Colon  polyps    Depression    No need for therapy.  Improved   Diabetes ([place])    Diverticula, colon    Hemorrhoids    Hyperlipidemia    Hypertension    Sepsis ([place])    Sepsis ([place])    Tinnitus    UTI (lower urinary tract infection)    Past Surgical History:  Procedure Laterality Date   MELANOMA EXCISION     LEFT CHEST   MELANOMA EXCISION      Allergies  Allergies  Allergen Reactions   Lipitor [Atorvastatin Calcium]     Muscle ache    Home Medications    Prior to Admission medications   Medication Sig Start Date End Date Taking? Authorizing Provider  apixaban (ELIQUIS) 5 MG TABS tablet Take 1 tablet (5 mg total) by mouth 2 (two) times daily. 05/11/22   [name], [name], MD  Blood Glucose Monitoring Suppl (ONE TOUCH ULTRA 2) w/Device KIT Use daily to check blood sugar. 10/13/20   [name], MD  FLUoxetine (PROZAC) 40 MG capsule TAKE ONE CAPSULE BY MOUTH DAILY 07/12/22   [name], MD  ibuprofen (ADVIL,MOTRIN) 200 MG tablet Take 600 mg by mouth as needed for moderate pain.    [provider]  Lancets [facility] ULTRASOFT) lancets USE TO TEST BLOOD SUGAR DAILY 12/31/21   [name], MD  losartan (COZAAR) 50 MG tablet TAKE 1 TABLET BY MOUTH DAILY 07/12/22   [name], MD  metoprolol tartrate (LOPRESSOR) 25 MG tablet TAKE 1/2 TABLET BY MOUTH TWO TIMES A DAY 04/07/22  Werner Lean, MD  ONETOUCH ULTRA test strip USE TO TEST BLOOD SUGAR DAILY AS DIRECTED 12/31/21   [name], MD  pantoprazole (PROTONIX) 20 MG tablet TAKE ONE TABLET BY MOUTH DAILY 05/10/22   [name], MD  pravastatin (PRAVACHOL) 80 MG tablet Take 1 tablet (80 mg total) by mouth every evening. 11/10/21   [name], [name], MD  tadalafil (CIALIS) 10 MG tablet Take 1 tablet (10 mg total) by mouth daily. 10/21/21   [name], MD  traMADol (ULTRAM) 50 MG tablet Take 2 tablets (100 mg total) by mouth every 12 (twelve) hours as needed. 09/14/22   [name], MD     Physical Exam    Vital Signs:  Brent Taylor does not have vital signs available for review today.  Given telephonic nature of communication, physical exam is limited. AAOx3. NAD. Normal affect.  Speech and respirations are unlabored.  Accessory Clinical Findings    None  Assessment & Plan    1.  Preoperative Cardiovascular Risk Assessment: The patient is doing well from a cardiac perspective. Therefore, based on ACC/AHA guidelines, the patient would be at acceptable risk for the planned procedure without further cardiovascular testing. According to the Revised Cardiac Risk Index (RCRI), his Perioperative Risk of Major Cardiac Event is (%): 0.9 His Functional Capacity in METs is: 6.61 according to the Duke Activity Status Index (DASI).   The patient was advised that if he develops new symptoms prior to surgery to contact our office to arrange for a follow-up visit, and he verbalized understanding.  Per office protocol, patient can hold Eliquis for 3 days prior to procedure.   A copy of this note will be routed to requesting surgeon.  Time:   Today, I have spent 7 minutes with the patient with telehealth technology discussing medical history, symptoms, and management plan.     Emmaline Life, NP-C  09/24/2022, 10:35 AM [address], [address] Office [phone] Fax [phone]

## 2022-09-23 NOTE — Telephone Encounter (Signed)
Primary [place], MD   Preoperative team, please contact this patient and set up a phone call appointment for further preoperative risk assessment. Please obtain consent and complete medication review. Thank you for your help.   I confirm that guidance regarding antiplatelet and oral anticoagulation therapy has been completed and, if necessary, noted below.   Emmaline Life, NP-C  09/23/2022, 3:01 PM [address], [address] Office [phone] Fax [phone]

## 2022-09-24 ENCOUNTER — Encounter: Payer: Self-pay | Admitting: Nurse Practitioner

## 2022-09-24 ENCOUNTER — Ambulatory Visit: Payer: Medicare HMO | Attending: Nurse Practitioner | Admitting: Nurse Practitioner

## 2022-09-24 DIAGNOSIS — E669 Obesity, unspecified: Secondary | ICD-10-CM | POA: Diagnosis not present

## 2022-09-24 DIAGNOSIS — D6869 Other thrombophilia: Secondary | ICD-10-CM | POA: Diagnosis not present

## 2022-09-24 DIAGNOSIS — Z008 Encounter for other general examination: Secondary | ICD-10-CM | POA: Diagnosis not present

## 2022-09-24 DIAGNOSIS — E785 Hyperlipidemia, unspecified: Secondary | ICD-10-CM | POA: Diagnosis not present

## 2022-09-24 DIAGNOSIS — Z7901 Long term (current) use of anticoagulants: Secondary | ICD-10-CM | POA: Diagnosis not present

## 2022-09-24 DIAGNOSIS — I110 Hypertensive heart disease with heart failure: Secondary | ICD-10-CM | POA: Diagnosis not present

## 2022-09-24 DIAGNOSIS — Z79891 Long term (current) use of opiate analgesic: Secondary | ICD-10-CM | POA: Diagnosis not present

## 2022-09-24 DIAGNOSIS — Z8249 Family history of ischemic heart disease and other diseases of the circulatory system: Secondary | ICD-10-CM | POA: Diagnosis not present

## 2022-09-24 DIAGNOSIS — Z01810 Encounter for preprocedural cardiovascular examination: Secondary | ICD-10-CM

## 2022-09-24 DIAGNOSIS — I509 Heart failure, unspecified: Secondary | ICD-10-CM | POA: Diagnosis not present

## 2022-09-24 DIAGNOSIS — R69 Illness, unspecified: Secondary | ICD-10-CM | POA: Diagnosis not present

## 2022-09-24 DIAGNOSIS — I4891 Unspecified atrial fibrillation: Secondary | ICD-10-CM | POA: Diagnosis not present

## 2022-09-24 DIAGNOSIS — I2510 Atherosclerotic heart disease of native coronary artery without angina pectoris: Secondary | ICD-10-CM | POA: Diagnosis not present

## 2022-09-28 DIAGNOSIS — M5416 Radiculopathy, lumbar region: Secondary | ICD-10-CM | POA: Diagnosis not present

## 2022-10-03 DIAGNOSIS — G4733 Obstructive sleep apnea (adult) (pediatric): Secondary | ICD-10-CM | POA: Diagnosis not present

## 2022-10-05 ENCOUNTER — Other Ambulatory Visit: Payer: Self-pay | Admitting: Internal Medicine

## 2022-10-07 ENCOUNTER — Encounter: Payer: Self-pay | Admitting: Neurology

## 2022-10-10 ENCOUNTER — Other Ambulatory Visit: Payer: Self-pay | Admitting: Family Medicine

## 2022-10-15 DIAGNOSIS — M5451 Vertebrogenic low back pain: Secondary | ICD-10-CM | POA: Diagnosis not present

## 2022-10-16 DIAGNOSIS — M5416 Radiculopathy, lumbar region: Secondary | ICD-10-CM | POA: Diagnosis not present

## 2022-10-20 ENCOUNTER — Ambulatory Visit: Payer: Medicare HMO | Admitting: Diagnostic Neuroimaging

## 2022-10-20 ENCOUNTER — Telehealth: Payer: Self-pay | Admitting: Family Medicine

## 2022-10-20 DIAGNOSIS — M48061 Spinal stenosis, lumbar region without neurogenic claudication: Secondary | ICD-10-CM | POA: Diagnosis not present

## 2022-10-20 DIAGNOSIS — M5451 Vertebrogenic low back pain: Secondary | ICD-10-CM | POA: Diagnosis not present

## 2022-10-20 NOTE — Telephone Encounter (Signed)
..  Type of form received: clearance form  Additional comments:   Received [id number] drop off/ [facility]  Form should be Faxed [phone]  Form should be mailed to:  na   Is patient requesting call for pickup:  Yes   Form placed:   Dr Ellwood Handler folder  Attach charge sheet.  Yes  Individual made aware of 3-5 business day turn around (Y/N)?  Yes

## 2022-10-21 ENCOUNTER — Telehealth: Payer: Self-pay | Admitting: *Deleted

## 2022-10-21 NOTE — Telephone Encounter (Signed)
   Primary Cardiologist: Werner Lean, MD  Chart reviewed as part of pre-operative protocol coverage. Given past medical history and time since last visit, based on ACC/AHA guidelines, Cristobal Duerksen would be at acceptable risk for the planned procedure without further cardiovascular testing.   I called to verify no change in patient's medical history. Patient was advised that if he develops new symptoms prior to surgery to contact our office to arrange a follow-up appointment.  He verbalized understanding.  Previous clearance to hold Eliquis for 3 days prior to [facility] 1219/23. He resumed Eliquis the day following that procedure and has had no change in medical history since that time. Advised him to again hold Eliquis for 3 days prior to lumbar decompression and resume as soon as hemodynamically stable following the procedure.   I will route this recommendation to the requesting party via Epic fax function and remove from pre-op pool.  Please call with questions.  Emmaline Life, NP-C  10/21/2022, 4:45 PM [address] Office [phone] Fax [phone]

## 2022-10-21 NOTE — Telephone Encounter (Signed)
   Pre-operative Risk Assessment    Patient Name: Brent Taylor  DOB: [birth year] MRN: [id number]      Request for Surgical Clearance    Procedure:   L4-5 DECOMPRESSION  Date of Surgery:  Clearance TBD                                 Surgeon:  DR. [facility] BROOKS  Surgeon's Group or Practice Name:  [facility] Phone number:  [phone] ATTN: Orson Slick Fax number:  [phone]   Type of Clearance Requested:   - Medical  - Pharmacy:  Hold Apixaban (Eliquis)     Type of Anesthesia:  General    Additional requests/questions:    Jiles Prows   10/21/2022, 3:04 PM

## 2022-10-22 ENCOUNTER — Ambulatory Visit (HOSPITAL_COMMUNITY): Payer: Self-pay | Admitting: Orthopedic Surgery

## 2022-10-25 ENCOUNTER — Ambulatory Visit: Payer: Medicare HMO | Attending: Internal Medicine

## 2022-10-25 ENCOUNTER — Ambulatory Visit: Payer: Medicare HMO | Admitting: Family Medicine

## 2022-10-25 DIAGNOSIS — I2510 Atherosclerotic heart disease of native coronary artery without angina pectoris: Secondary | ICD-10-CM | POA: Diagnosis not present

## 2022-10-25 DIAGNOSIS — I480 Paroxysmal atrial fibrillation: Secondary | ICD-10-CM | POA: Diagnosis not present

## 2022-10-25 LAB — BASIC METABOLIC PANEL
BUN/Creatinine Ratio: 16 (ref 10–24)
BUN: 19 mg/dL (ref 8–27)
CO2: 24 mmol/L (ref 20–29)
Calcium: 9.1 mg/dL (ref 8.6–10.2)
Chloride: 105 mmol/L (ref 96–106)
Creatinine, Ser: 1.18 mg/dL (ref 0.76–1.27)
Glucose: 141 mg/dL — ABNORMAL HIGH (ref 70–99)
Potassium: 5.3 mmol/L — ABNORMAL HIGH (ref 3.5–5.2)
Sodium: 143 mmol/L (ref 134–144)
eGFR: 61 mL/min/{1.73_m2} (ref >59)

## 2022-10-26 ENCOUNTER — Encounter: Payer: Self-pay | Admitting: Family Medicine

## 2022-10-26 ENCOUNTER — Ambulatory Visit (INDEPENDENT_AMBULATORY_CARE_PROVIDER_SITE_OTHER): Payer: Medicare HMO | Admitting: Family Medicine

## 2022-10-26 VITALS — BP 118/70 | HR 85 | Temp 96.6°F | Ht 72.0 in | Wt 244.0 lb

## 2022-10-26 DIAGNOSIS — E1169 Type 2 diabetes mellitus with other specified complication: Secondary | ICD-10-CM | POA: Diagnosis not present

## 2022-10-26 DIAGNOSIS — N529 Male erectile dysfunction, unspecified: Secondary | ICD-10-CM | POA: Diagnosis not present

## 2022-10-26 DIAGNOSIS — I2510 Atherosclerotic heart disease of native coronary artery without angina pectoris: Secondary | ICD-10-CM

## 2022-10-26 DIAGNOSIS — Z0001 Encounter for general adult medical examination with abnormal findings: Secondary | ICD-10-CM | POA: Diagnosis not present

## 2022-10-26 DIAGNOSIS — I152 Hypertension secondary to endocrine disorders: Secondary | ICD-10-CM

## 2022-10-26 DIAGNOSIS — M48061 Spinal stenosis, lumbar region without neurogenic claudication: Secondary | ICD-10-CM

## 2022-10-26 DIAGNOSIS — E785 Hyperlipidemia, unspecified: Secondary | ICD-10-CM

## 2022-10-26 DIAGNOSIS — K219 Gastro-esophageal reflux disease without esophagitis: Secondary | ICD-10-CM | POA: Diagnosis not present

## 2022-10-26 DIAGNOSIS — E1159 Type 2 diabetes mellitus with other circulatory complications: Secondary | ICD-10-CM | POA: Diagnosis not present

## 2022-10-26 DIAGNOSIS — F325 Major depressive disorder, single episode, in full remission: Secondary | ICD-10-CM

## 2022-10-26 DIAGNOSIS — R69 Illness, unspecified: Secondary | ICD-10-CM | POA: Diagnosis not present

## 2022-10-26 LAB — CBC
HCT: 46 % (ref 39.0–52.0)
Hemoglobin: 14.1 g/dL (ref 13.0–17.0)
MCHC: 30.6 g/dL (ref 30.0–36.0)
MCV: 79.1 fl (ref 78.0–100.0)
Platelets: 239 10*3/uL (ref 150.0–400.0)
RBC: 5.82 Mil/uL — ABNORMAL HIGH (ref 4.22–5.81)
RDW: 17.6 % — ABNORMAL HIGH (ref 11.5–15.5)
WBC: 6.4 10*3/uL (ref 4.0–10.5)

## 2022-10-26 LAB — COMPREHENSIVE METABOLIC PANEL
ALT: 23 U/L (ref 0–53)
AST: 24 U/L (ref 0–37)
Albumin: 4.3 g/dL (ref 3.5–5.2)
Alkaline Phosphatase: 52 U/L (ref 39–117)
BUN: 20 mg/dL (ref 6–23)
CO2: 28 mEq/L (ref 19–32)
Calcium: 9.3 mg/dL (ref 8.4–10.5)
Chloride: 104 mEq/L (ref 96–112)
Creatinine, Ser: 1.22 mg/dL (ref 0.40–1.50)
GFR: 54.65 mL/min — ABNORMAL LOW (ref >60.00)
Glucose, Bld: 142 mg/dL — ABNORMAL HIGH (ref 70–99)
Potassium: 5.1 mEq/L (ref 3.5–5.1)
Sodium: 142 mEq/L (ref 135–145)
Total Bilirubin: 0.7 mg/dL (ref 0.2–1.2)
Total Protein: 6.6 g/dL (ref 6.0–8.3)

## 2022-10-26 LAB — LIPID PANEL
Cholesterol: 134 mg/dL (ref 0–200)
HDL: 38.3 mg/dL — ABNORMAL LOW (ref >39.00)
LDL Cholesterol: 70 mg/dL (ref 0–99)
NonHDL: 95.89
Total CHOL/HDL Ratio: 4
Triglycerides: 127 mg/dL (ref 0.0–149.0)
VLDL: 25.4 mg/dL (ref 0.0–40.0)

## 2022-10-26 LAB — MICROALBUMIN / CREATININE URINE RATIO
Creatinine,U: 94.8 mg/dL
Microalb Creat Ratio: 1.9 mg/g (ref 0.0–30.0)
Microalb, Ur: 1.8 mg/dL (ref 0.0–1.9)

## 2022-10-26 LAB — HEMOGLOBIN A1C: Hgb A1c MFr Bld: 7.1 % — ABNORMAL HIGH (ref 4.6–6.5)

## 2022-10-26 LAB — TSH: TSH: 2.43 u[IU]/mL (ref 0.35–5.50)

## 2022-10-26 MED ORDER — TADALAFIL 10 MG PO TABS: 10.0000 mg | ORAL_TABLET | Freq: Every day | ORAL | 3 refills | Status: DC

## 2022-10-26 NOTE — Assessment & Plan Note (Signed)
Symptoms are not controlled.  Follows with neurosurgery for this.  He has not had much success with conservative management and has upcoming lumbar decompression in a couple of weeks.

## 2022-10-26 NOTE — Assessment & Plan Note (Signed)
Stable on Protonix 20 mg daily.

## 2022-10-26 NOTE — Assessment & Plan Note (Signed)
Check lipids.  Continue on pravastatin 80 mg daily.  Tolerating well.

## 2022-10-26 NOTE — Assessment & Plan Note (Signed)
Blood pressure at goal today on metoprolol tartrate 12.5 mg twice daily and quinapril 20 mg daily.  Check labs.

## 2022-10-26 NOTE — Assessment & Plan Note (Signed)
Currently on any medications.  Check A1c today.  Continue lifestyle modifications.

## 2022-10-26 NOTE — Assessment & Plan Note (Signed)
Follows with cardiology.  On Eliquis and statin.

## 2022-10-26 NOTE — Patient Instructions (Signed)
It was very nice to see you today!  We will check blood work today.  I will refill your medications.  I think you will do great with your surgery.  Please like to see me in 6 months.  Come back to see me sooner if needed.  Take care, Dr [name]  PLEASE NOTE:  If you had any lab tests, please let us know if you have not heard back within a few days. You may see your results on mychart before we have a chance to review them but we will give you a call once they are reviewed by [place].   If we ordered any referrals today, please let us know if you have not heard from their office within the next week.   If you had any urgent prescriptions sent in today, please check with the pharmacy within an hour of our visit to make sure the prescription was transmitted appropriately.   Please try these tips to maintain a healthy lifestyle:  Eat at least 3 REAL meals and 1-2 snacks per day.  Aim for no more than 5 hours between eating.  If you eat breakfast, please do so within one hour of getting up.   Each meal should contain half fruits/vegetables, one quarter protein, and one quarter carbs (no bigger than a computer mouse)  Cut down on sweet beverages. This includes juice, soda, and sweet tea.   Drink at least 1 glass of water with each meal and aim for at least 8 glasses per day  Exercise at least 150 minutes every week.    Preventive Care 62 Years and Older, Male Preventive care refers to lifestyle choices and visits with your health care provider that can promote health and wellness. Preventive care visits are also called wellness exams. What can I expect for my preventive care visit? Counseling During your preventive care visit, your health care provider may ask about your: Medical history, including: Past medical problems. Family medical history. History of falls. Current health, including: Emotional well-being. Home life and relationship well-being. Sexual activity. Memory and  ability to understand (cognition). Lifestyle, including: Alcohol, nicotine or tobacco, and drug use. Access to firearms. Diet, exercise, and sleep habits. Work and work Statistician. Sunscreen use. Safety issues such as seatbelt and bike helmet use. Physical exam Your health care provider will check your: Height and weight. These may be used to calculate your BMI (body mass index). BMI is a measurement that tells if you are at a healthy weight. Waist circumference. This measures the distance around your waistline. This measurement also tells if you are at a healthy weight and may help predict your risk of certain diseases, such as type 2 diabetes and high blood pressure. Heart rate and blood pressure. Body temperature. Skin for abnormal spots. What immunizations do I need?  Vaccines are usually given at various ages, according to a schedule. Your health care provider will recommend vaccines for you based on your age, medical history, and lifestyle or other factors, such as travel or where you work. What tests do I need? Screening Your health care provider may recommend screening tests for certain conditions. This may include: Lipid and cholesterol levels. Diabetes screening. This is done by checking your blood sugar (glucose) after you have not eaten for a while (fasting). Hepatitis C test. Hepatitis B test. HIV (human immunodeficiency virus) test. STI (sexually transmitted infection) testing, if you are at risk. Lung cancer screening. Colorectal cancer screening. Prostate cancer screening. Abdominal aortic aneurysm (AAA)  screening. You may need this if you are a current or former smoker. Talk with your health care provider about your test results, treatment options, and if necessary, the need for more tests. Follow these instructions at home: Eating and drinking  Eat a diet that includes fresh fruits and vegetables, whole grains, lean protein, and low-fat dairy products. Limit your  intake of foods with high amounts of sugar, saturated fats, and salt. Take vitamin and mineral supplements as recommended by your health care provider. Do not drink alcohol if your health care provider tells you not to drink. If you drink alcohol: Limit how much you have to 0-2 drinks a day. Know how much alcohol is in your drink. In the [place] one drink equals one 12 oz bottle of beer (355 mL), one 5 oz glass of wine (148 mL), or one 1 oz glass of hard liquor (44 mL). Lifestyle Brush your teeth every morning and night with fluoride toothpaste. Floss one time each day. Exercise for at least 30 minutes 5 or more days each week. Do not use any products that contain nicotine or tobacco. These products include cigarettes, chewing tobacco, and vaping devices, such as e-cigarettes. If you need help quitting, ask your health care provider. Do not use drugs. If you are sexually active, practice safe sex. Use a condom or other form of protection to prevent STIs. Take aspirin only as told by your health care provider. Make sure that you understand how much to take and what form to take. Work with your health care provider to find out whether it is safe and beneficial for you to take aspirin daily. Ask your health care provider if you need to take a cholesterol-lowering medicine (statin). Find healthy ways to manage stress, such as: Meditation, yoga, or listening to music. Journaling. Talking to a trusted person. Spending time with friends and family. Safety Always wear your seat belt while driving or riding in a vehicle. Do not drive: If you have been drinking alcohol. Do not ride with someone who has been drinking. When you are tired or distracted. While texting. If you have been using any mind-altering substances or drugs. Wear a helmet and other protective equipment during sports activities. If you have firearms in your house, make sure you follow all gun safety procedures. Minimize exposure to  UV radiation to reduce your risk of skin cancer. What's next? Visit your health care provider once a year for an annual wellness visit. Ask your health care provider how often you should have your eyes and teeth checked. Stay up to date on all vaccines. This information is not intended to replace advice given to you by your health care provider. Make sure you discuss any questions you have with your health care provider. Document Revised: 03/25/2021 Document Reviewed: 03/25/2021 Elsevier Patient Education  [place].

## 2022-10-26 NOTE — Assessment & Plan Note (Signed)
Doing well on Prozac 40 mg daily.

## 2022-10-26 NOTE — Progress Notes (Signed)
Chief Complaint:  Brent Taylor is a 82 y.o. male who presents today for his annual comprehensive physical exam.    Assessment/Plan:  Chronic Problems Addressed Today: Type 2 diabetes mellitus with vascular disease ([place]) Currently on any medications.  Check A1c today.  Continue lifestyle modifications.  Erectile dysfunction Stable on Cialis 10 mg daily as needed.  Will refill today.  Dyslipidemia associated with type 2 diabetes mellitus (H[facility]) Check lipids.  Continue on pravastatin 80 mg daily.  Tolerating well.  Spinal stenosis of lumbar region Symptoms are not controlled.  Follows with neurosurgery for this.  He has not had much success with conservative management and has upcoming lumbar decompression in a couple of weeks.  Hypertension associated with diabetes ([place]) Blood pressure at goal today on metoprolol tartrate 12.5 mg twice daily and quinapril 20 mg daily.  Check labs.  GERD (gastroesophageal reflux disease) Stable on Protonix 20 mg daily.  Major depression in remission [facility]) Doing well on Prozac 40 mg daily.  Coronary artery disease Follows with cardiology.  On Eliquis and statin.   Preventative Healthcare: Check labs.  He will go to the pharmacy for tetanus vaccine.  Up-to-date on other vaccines.  Patient Counseling(The following topics were reviewed and/or handout was given):  -Nutrition: Stressed importance of moderation in sodium/caffeine intake, saturated fat and cholesterol, caloric balance, sufficient intake of fresh fruits, vegetables, and fiber.  -Stressed the importance of regular exercise.   -Substance Abuse: Discussed cessation/primary prevention of tobacco, alcohol, or other drug use; driving or other dangerous activities under the influence; availability of treatment for abuse.   -Injury prevention: Discussed safety belts, safety helmets, smoke detector, smoking near bedding or upholstery.   -Sexuality: Discussed sexually transmitted diseases, partner  selection, use of condoms, avoidance of unintended pregnancy and contraceptive alternatives.   -Dental health: Discussed importance of regular tooth brushing, flossing, and dental visits.  -Health maintenance and immunizations reviewed. Please refer to Health maintenance section.  Return to care in 1 year for next preventative visit.     Subjective:  HPI:  He has no acute complaints today.   He was last seen here about 6 months ago.  Since our last visit he has had ongoing issues with low back pain with lumbar radiculopathy.  He has been following with neurology and neurosurgery for this and has upcoming lumbar decompression in a couple of weeks.  Lifestyle Diet: Balanced. Plenty of fruits.  Exercise: Goes to stretch zone. Somewhat limited due to back pain.      10/26/2022    8:04 AM  Depression screen PHQ 2/9  Down, Depressed, Hopeless 0  PHQ - 2 Score 0  Altered sleeping 0  Tired, decreased energy 0  Change in appetite 0  Feeling bad or failure about yourself  0  Trouble concentrating 0  Moving slowly or fidgety/restless 0  Suicidal thoughts 0  PHQ-9 Score 0  Difficult doing work/chores Not difficult at all    Health Maintenance Due  Topic Date Due   DTaP/Tdap/Td (1 - Tdap) 03/14/2014   Diabetic kidney evaluation - Urine ACR  10/25/2018   FOOT EXAM  10/31/2019   OPHTHALMOLOGY EXAM  05/21/2022   HEMOGLOBIN A1C  10/23/2022   Medicare Annual Wellness (AWV)  12/03/2022     ROS: Per HPI, otherwise a complete review of systems was negative.   PMH:  The following were reviewed and entered/updated in epic: Past Medical History:  Diagnosis Date   Atrial fibrillation ([place])    Cancer ([place])  hx - melanoma   Chicken pox    Colon polyps    Depression    No need for therapy.  Improved   Diabetes ([place])    Diverticula, colon    Hemorrhoids    Hyperlipidemia    Hypertension    Sepsis ([place])    Sepsis ([place])    Tinnitus    UTI (lower urinary tract infection)     Patient Active Problem List   Diagnosis Date Noted   Gait abnormality 09/16/2022   Degenerative disc disease, cervical 09/16/2022   B12 deficiency 04/22/2022   Left shoulder pain 01/26/2022   Erectile dysfunction 10/21/2021   Thoracic Aortic dilatation ([place]) 02/13/2021   OSA (obstructive sleep apnea) 12/09/2020   Sensorineural hearing loss (SNHL) of left ear with restricted hearing of right ear 08/19/2020   Subjective tinnitus, bilateral 08/19/2020   Atrial fibrillation ([place]) 06/10/2020   Degeneration of lumbar intervertebral disc 03/31/2020   Low back pain 03/11/2020   Spinal stenosis of lumbar region 08/29/2019   Type 2 diabetes mellitus with vascular disease ([place]) 10/20/2016   Hypertension associated with diabetes ([place]) 11/20/2015   Coronary artery disease 12/10/2011   Dyslipidemia associated with type 2 diabetes mellitus ([place]) 12/10/2011   Major depression in remission ([place]) 12/10/2011   GERD (gastroesophageal reflux disease) 12/10/2011   Hx of colonic polyps 12/10/2011   Melanoma ([place]) 12/10/2011   Past Surgical History:  Procedure Laterality Date   MELANOMA EXCISION     LEFT CHEST   MELANOMA EXCISION      Family History  Problem Relation Age of Onset   Heart Problems Mother        Skipping    Medications- reviewed and updated Current Outpatient Medications  Medication Sig Dispense Refill   apixaban (ELIQUIS) 5 MG TABS tablet Take 1 tablet (5 mg total) by mouth 2 (two) times daily. 60 tablet 5   Blood Glucose Monitoring Suppl (ONE TOUCH ULTRA 2) w/Device KIT Use daily to check blood sugar. 1 kit 0   FLUoxetine (PROZAC) 40 MG capsule TAKE ONE CAPSULE BY MOUTH DAILY 90 capsule 3   ibuprofen (ADVIL,MOTRIN) 200 MG tablet Take 600 mg by mouth as needed for moderate pain.     Lancets (ONETOUCH ULTRASOFT) lancets USE TO TEST BLOOD SUGAR DAILY 100 each 12   losartan (COZAAR) 50 MG tablet TAKE 1 TABLET BY MOUTH DAILY 90 tablet 0   metoprolol tartrate (LOPRESSOR) 25 MG  tablet TAKE 1/2 TABLET BY MOUTH TWICE A DAY 90 tablet 3   ONETOUCH ULTRA test strip USE TO TEST BLOOD SUGAR DAILY AS DIRECTED 100 strip 3   pantoprazole (PROTONIX) 20 MG tablet TAKE ONE TABLET BY MOUTH DAILY 90 tablet 3   pravastatin (PRAVACHOL) 80 MG tablet Take 1 tablet (80 mg total) by mouth every evening. 90 tablet 3   traMADol (ULTRAM) 50 MG tablet Take 2 tablets (100 mg total) by mouth every 12 (twelve) hours as needed. 60 tablet 5   tadalafil (CIALIS) 10 MG tablet Take 1 tablet (10 mg total) by mouth daily. 90 tablet 3   No current facility-administered medications for this visit.    Allergies-reviewed and updated Allergies  Allergen Reactions   Lipitor [Atorvastatin Calcium]     Muscle ache    Social History   Socioeconomic History   Marital status: Married    Spouse name: Not on file   Number of children: 2   Years of education: Not on file   Highest education level: Not on file  Occupational History   Occupation: Company secretary   Occupation: retired  Tobacco Use   Smoking status: Never   Smokeless tobacco: Never  Scientific laboratory technician Use: Never used  Substance and Sexual Activity   Alcohol use: No   Drug use: No   Sexual activity: Not on file  Other Topics Concern   Not on file  Social History Narrative   Lives with wife in [place].   Retired [employer]   Attends [place] Strain: Low Risk  (12/03/2021)   Overall Financial Resource Strain (CARDIA)    Difficulty of Paying Living Expenses: Not hard at all  Food Insecurity: No Food Insecurity (12/03/2021)   Hunger Vital Sign    Worried About Running Out of Food in the Last Year: Never true    [place] in the Last Year: Never true  Transportation Needs: No Transportation Needs (12/03/2021)   PRAPARE - Hydrologist (Medical): No    Lack of Transportation (Non-Medical): No  Physical Activity: Sufficiently  Active (12/03/2021)   Exercise Vital Sign    Days of Exercise per Week: 3 days    Minutes of Exercise per Session: 60 min  Stress: No Stress Concern Present (12/03/2021)   [place]    Feeling of Stress : Not at all  Social Connections: Moderately Integrated (12/03/2021)   Social Connection and Isolation Panel [NHANES]    Frequency of Communication with Friends and Family: Three times a week    Frequency of Social Gatherings with Friends and Family: More than three times a week    Attends Religious Services: More than 4 times per year    Active Member of Clubs or Organizations: No    Attends Archivist Meetings: Never    Marital Status: Married        Objective:  Physical Exam: BP 118/70   Pulse 85   Temp (!) 96.6 F (35.9 C) (Temporal)   Ht 6' (1.829 m)   Wt 244 lb (110.7 kg)   SpO2 99%   BMI 33.09 kg/m   Body mass index is 33.09 kg/m. Wt Readings from Last 3 Encounters:  10/26/22 244 lb (110.7 kg)  09/16/22 252 lb (114.3 kg)  04/22/22 248 lb 6.4 oz (112.7 kg)   Gen: NAD, resting comfortably HEENT: OP clear. No thyromegaly noted.  CV: RRR with no murmurs appreciated Pulm: NWOB, CTAB with no crackles, wheezes, or rhonchi GI: Normal bowel sounds present. Soft, Nontender, Nondistended. MSK: no edema, cyanosis, or clubbing noted Skin: warm, dry Neuro: CN2-12 grossly intact. Strength 5/5 in upper and lower extremities. Reflexes symmetric and intact bilaterally.  Psych: Normal affect and thought content     Wayne Wicklund M. [name], MD 10/26/2022 8:37 AM

## 2022-10-26 NOTE — Assessment & Plan Note (Signed)
Stable on Cialis 10 mg daily as needed.  Will refill today.

## 2022-10-27 ENCOUNTER — Telehealth: Payer: Self-pay

## 2022-10-27 DIAGNOSIS — E875 Hyperkalemia: Secondary | ICD-10-CM

## 2022-10-27 NOTE — Telephone Encounter (Signed)
Patient aware of results. Will place order for BMET.

## 2022-10-27 NOTE — Telephone Encounter (Signed)
-----  Message from Werner Lean, MD sent at 10/25/2022  7:34 PM EST ----- Unclear why he got a BMP; K is slightly elevated Would recheck; if still elevated cut losartan dose in half ----- Message ----- From: Interface, [facility] Lab Results In Sent: 10/25/2022   5:35 PM EST To: Werner Lean, MD

## 2022-10-28 ENCOUNTER — Ambulatory Visit: Payer: Medicare HMO | Attending: Internal Medicine

## 2022-10-28 DIAGNOSIS — E875 Hyperkalemia: Secondary | ICD-10-CM

## 2022-10-28 LAB — BASIC METABOLIC PANEL
BUN/Creatinine Ratio: 16 (ref 10–24)
BUN: 19 mg/dL (ref 8–27)
CO2: 24 mmol/L (ref 20–29)
Calcium: 9 mg/dL (ref 8.6–10.2)
Chloride: 103 mmol/L (ref 96–106)
Creatinine, Ser: 1.17 mg/dL (ref 0.76–1.27)
Glucose: 129 mg/dL — ABNORMAL HIGH (ref 70–99)
Potassium: 4.8 mmol/L (ref 3.5–5.2)
Sodium: 140 mmol/L (ref 134–144)
eGFR: 62 mL/min/{1.73_m2} (ref >59)

## 2022-10-28 NOTE — Progress Notes (Signed)
Please inform patient of the following:  His A1c is elevated to 7.1.  This is up quite a bit since our last time but did not necessarily need to start any medications for this at this point.  He should continue to work on diet and exercise and reduce sugar and carbs.  We can recheck this again at his 21-monthfollow-up visit.  The rest of his labs are all stable and we can recheck everything next office visit.

## 2022-10-29 ENCOUNTER — Telehealth: Payer: Self-pay | Admitting: Family Medicine

## 2022-10-29 NOTE — Telephone Encounter (Signed)
Caller states: - She faxed over a form request on 10/21/22 for PCP to sign off  - Form is giving permission for patient to receive a specimen kit to his home  - Specimen will be testing for certain cancer gene mutations-- see which cancer he is more likely to develop   Caller requests: - This form be completed asap and faxed back to her

## 2022-11-01 DIAGNOSIS — G4733 Obstructive sleep apnea (adult) (pediatric): Secondary | ICD-10-CM | POA: Diagnosis not present

## 2022-11-01 NOTE — Telephone Encounter (Signed)
Spoke with patient patient was under the impression Dr [name] order test. Patient notified Dr [name] Did not order testing  Order not sign patient agreed

## 2022-11-02 ENCOUNTER — Encounter (HOSPITAL_COMMUNITY): Payer: Self-pay

## 2022-11-02 NOTE — Pre-Procedure Instructions (Signed)
Surgical Instructions    Your procedure is scheduled on Monday, January 29th.  Report to [facility] Main Entrance "A" at 11:45 A.M., then check in with the Admitting office.  Call this number if you have problems the morning of surgery:  [phone]  If you have any questions prior to your surgery date call [phone]: Open Monday-Friday 8am-4pm If you experience any cold or flu symptoms such as cough, fever, chills, shortness of breath, etc. between now and your scheduled surgery, please notify us at the above number.     Remember:  Do not eat after midnight the night before your surgery  You may drink clear liquids until 10:45 a.m. the morning of your surgery.   Clear liquids allowed are: Water, Non-Citrus Juices (without pulp), Carbonated Beverages, Clear Tea, Black Coffee Only (NO MILK, CREAM OR POWDERED CREAMER of any kind), and Gatorade.    Take these medicines the morning of surgery with A SIP OF WATER  FLUoxetine (PROZAC)  metoprolol tartrate (LOPRESSOR)  pantoprazole (PROTONIX)  pravastatin (PRAVACHOL)    Take these medications AS NEEDED: fluticasone (FLONASE)  methocarbamol (ROBAXIN)  traMADol (ULTRAM)   Follow your surgeon's instructions on when to stop apixaban (ELIQUIS).   If no instructions were given by your surgeon then you will need to call the office to get those instructions.    As of today, STOP taking any Aspirin (unless otherwise instructed by your surgeon) Aleve, Naproxen, Ibuprofen, Motrin, Advil, Goody's, BC's, all herbal medications, fish oil, and all vitamins.                     Do NOT Smoke (Tobacco/Vaping) for 24 hours prior to your procedure.  If you use a CPAP at night, you may bring your mask/headgear for your overnight stay.   Contacts, glasses, piercing's, hearing aid's, dentures or partials may not be worn into surgery, please bring cases for these belongings.    For patients admitted to the hospital, discharge time will be determined by  your treatment team.   Patients discharged the day of surgery will not be allowed to drive home, and someone needs to stay with them for 24 hours.  SURGICAL WAITING ROOM VISITATION Patients having surgery or a procedure may have no more than 2 support people in the waiting area - these visitors may rotate.   Children under the age of 76 must have an adult with them who is not the patient. If the patient needs to stay at the hospital during part of their recovery, the visitor guidelines for inpatient rooms apply. Pre-op nurse will coordinate an appropriate time for 1 support person to accompany patient in pre-op.  This support person may not rotate.   Please refer to the [facility] website for the visitor guidelines for Inpatients (after your surgery is over and you are in a regular room).    Special instructions:   [place]- Preparing For Surgery  Before surgery, you can play an important role. Because skin is not sterile, your skin needs to be as free of germs as possible. You can reduce the number of germs on your skin by washing with CHG (chlorahexidine gluconate) Soap before surgery.  CHG is an antiseptic cleaner which kills germs and bonds with the skin to continue killing germs even after washing.    Oral Hygiene is also important to reduce your risk of infection.  Remember - BRUSH YOUR TEETH THE MORNING OF SURGERY WITH YOUR REGULAR TOOTHPASTE  Please do not use  if you have an allergy to CHG or antibacterial soaps. If your skin becomes reddened/irritated stop using the CHG.  Do not shave (including legs and underarms) for at least 48 hours prior to first CHG shower. It is OK to shave your face.  Please follow these instructions carefully.   Shower the NIGHT BEFORE SURGERY and the MORNING OF SURGERY  If you chose to wash your hair, wash your hair first as usual with your normal shampoo.  After you shampoo, rinse your hair and body thoroughly to remove the shampoo.  Use CHG Soap  as you would any other liquid soap. You can apply CHG directly to the skin and wash gently with a scrungie or a clean washcloth.   Apply the CHG Soap to your body ONLY FROM THE NECK DOWN.  Do not use on open wounds or open sores. Avoid contact with your eyes, ears, mouth and genitals (private parts). Wash Face and genitals (private parts)  with your normal soap.   Wash thoroughly, paying special attention to the area where your surgery will be performed.  Thoroughly rinse your body with warm water from the neck down.  DO NOT shower/wash with your normal soap after using and rinsing off the CHG Soap.  Pat yourself dry with a CLEAN TOWEL.  Wear CLEAN PAJAMAS to bed the night before surgery  Place CLEAN SHEETS on your bed the night before your surgery  DO NOT SLEEP WITH PETS.   Day of Surgery: Take a shower with CHG soap. Do not wear jewelry. Do not wear lotions, powders, colognes, or deodorant. Men may shave face and neck. Do not bring valuables to the hospital.  [facility] is not responsible for any belongings or valuables. Wear Clean/Comfortable clothing the morning of surgery Remember to brush your teeth WITH YOUR REGULAR TOOTHPASTE.   Please read over the following fact sheets that you were given.    If you received a COVID test during your pre-op visit  it is requested that you wear a mask when out in public, stay away from anyone that may not be feeling well and notify your surgeon if you develop symptoms. If you have been in contact with anyone that has tested positive in the last 10 days please notify you surgeon.

## 2022-11-03 ENCOUNTER — Encounter (HOSPITAL_COMMUNITY)
Admission: RE | Admit: 2022-11-03 | Discharge: 2022-11-03 | Disposition: A | Discharge status: Home / Self Care | Payer: Medicare HMO | Source: Ambulatory Visit | Attending: Orthopedic Surgery | Admitting: Orthopedic Surgery

## 2022-11-03 ENCOUNTER — Other Ambulatory Visit: Payer: Self-pay

## 2022-11-03 ENCOUNTER — Encounter (HOSPITAL_COMMUNITY): Payer: Self-pay

## 2022-11-03 VITALS — BP 152/88 | HR 85 | Temp 98.0°F | Resp 17 | Ht 72.0 in | Wt 241.0 lb

## 2022-11-03 DIAGNOSIS — Z01818 Encounter for other preprocedural examination: Secondary | ICD-10-CM

## 2022-11-03 DIAGNOSIS — E1159 Type 2 diabetes mellitus with other circulatory complications: Secondary | ICD-10-CM | POA: Diagnosis not present

## 2022-11-03 DIAGNOSIS — Z01812 Encounter for preprocedural laboratory examination: Secondary | ICD-10-CM | POA: Diagnosis not present

## 2022-11-03 HISTORY — DX: Sleep apnea, unspecified: G47.30

## 2022-11-03 HISTORY — DX: Dyspnea, unspecified: R06.00

## 2022-11-03 HISTORY — DX: Gastro-esophageal reflux disease without esophagitis: K21.9

## 2022-11-03 HISTORY — DX: Atherosclerotic heart disease of native coronary artery without angina pectoris: I25.10

## 2022-11-03 LAB — TYPE AND SCREEN
ABO/RH(D): B POS
Antibody Screen: NEGATIVE

## 2022-11-03 LAB — SURGICAL PCR SCREEN
MRSA, PCR: NEGATIVE
Staphylococcus aureus: NEGATIVE

## 2022-11-03 LAB — GLUCOSE, CAPILLARY: Glucose-Capillary: 138 mg/dL — ABNORMAL HIGH (ref 70–99)

## 2022-11-03 NOTE — Pre-Procedure Instructions (Signed)
Surgical Instructions    Your procedure is scheduled on Monday, January 29th.  Report to [facility] Main Entrance "A" at 11:45 A.M., then check in with the Admitting office.  Call this number if you have problems the morning of surgery:  [phone]  If you have any questions prior to your surgery date call [phone]: Open Monday-Friday 8am-4pm If you experience any cold or flu symptoms such as cough, fever, chills, shortness of breath, etc. between now and your scheduled surgery, please notify us at the above number.     Remember:  Do not eat after midnight the night before your surgery  You may drink clear liquids until 10:45 a.m. the morning of your surgery.   Clear liquids allowed are: Water, Non-Citrus Juices (without pulp), Carbonated Beverages, Clear Tea, Black Coffee Only (NO MILK, CREAM OR POWDERED CREAMER of any kind), and Gatorade.    Take these medicines the morning of surgery with A SIP OF WATER  FLUoxetine (PROZAC)  metoprolol tartrate (LOPRESSOR)  pantoprazole (PROTONIX)  pravastatin (PRAVACHOL)    Take these medications AS NEEDED: fluticasone (FLONASE)  methocarbamol (ROBAXIN)  traMADol (ULTRAM)   Per your Doctor's instructions, STOP ELIQUIS 3 DAYS PRIOR TO SURGERY.  As of today, STOP taking any Aspirin (unless otherwise instructed by your surgeon) Aleve, Naproxen, Ibuprofen, Motrin, Advil, Goody's, BC's, all herbal medications, fish oil, and all vitamins.   HOW TO MANAGE YOUR DIABETES BEFORE AND AFTER SURGERY  Why is it important to control my blood sugar before and after surgery? Improving blood sugar levels before and after surgery helps healing and can limit problems. A way of improving blood sugar control is eating a healthy diet by:  Eating less sugar and carbohydrates  Increasing activity/exercise  Talking with your doctor about reaching your blood sugar goals High blood sugars (greater than 180 mg/dL) can raise your risk of infections and slow  your recovery, so you will need to focus on controlling your diabetes during the weeks before surgery. Make sure that the doctor who takes care of your diabetes knows about your planned surgery including the date and location.  How do I manage my blood sugar before surgery? Check your blood sugar at least 4 times a day, starting 2 days before surgery, to make sure that the level is not too high or low.  Check your blood sugar the morning of your surgery when you wake up and every 2 hours until you get to the Short Stay unit.  If your blood sugar is less than 70 mg/dL, you will need to treat for low blood sugar: Do not take insulin. Treat a low blood sugar (less than 70 mg/dL) with  cup of clear juice (cranberry or apple), 4 glucose tablets, OR glucose gel. Recheck blood sugar in 15 minutes after treatment (to make sure it is greater than 70 mg/dL). If your blood sugar is not greater than 70 mg/dL on recheck, call [phone] for further instructions. Report your blood sugar to the short stay nurse when you get to Short Stay.  If you are admitted to the hospital after surgery: Your blood sugar will be checked by the staff and you will probably be given insulin after surgery (instead of oral diabetes medicines) to make sure you have good blood sugar levels. The goal for blood sugar control after surgery is 80-180 mg/dL.                      Do NOT Smoke (Tobacco/Vaping) for  24 hours prior to your procedure.  If you use a CPAP at night, you may bring your mask/headgear for your overnight stay.   Contacts, glasses, piercing's, hearing aid's, dentures or partials may not be worn into surgery, please bring cases for these belongings.    For patients admitted to the hospital, discharge time will be determined by your treatment team.   Patients discharged the day of surgery will not be allowed to drive home, and someone needs to stay with them for 24 hours.  SURGICAL WAITING ROOM  VISITATION Patients having surgery or a procedure may have no more than 2 support people in the waiting area - these visitors may rotate.   Children under the age of 63 must have an adult with them who is not the patient. If the patient needs to stay at the hospital during part of their recovery, the visitor guidelines for inpatient rooms apply. Pre-op nurse will coordinate an appropriate time for 1 support person to accompany patient in pre-op.  This support person may not rotate.   Please refer to the [facility] website for the visitor guidelines for Inpatients (after your surgery is over and you are in a regular room).    Special instructions:   [place]- Preparing For Surgery  Before surgery, you can play an important role. Because skin is not sterile, your skin needs to be as free of germs as possible. You can reduce the number of germs on your skin by washing with CHG (chlorahexidine gluconate) Soap before surgery.  CHG is an antiseptic cleaner which kills germs and bonds with the skin to continue killing germs even after washing.    Oral Hygiene is also important to reduce your risk of infection.  Remember - BRUSH YOUR TEETH THE MORNING OF SURGERY WITH YOUR REGULAR TOOTHPASTE  Please do not use if you have an allergy to CHG or antibacterial soaps. If your skin becomes reddened/irritated stop using the CHG.  Do not shave (including legs and underarms) for at least 48 hours prior to first CHG shower. It is OK to shave your face.  Please follow these instructions carefully.   Shower the NIGHT BEFORE SURGERY and the MORNING OF SURGERY  If you chose to wash your hair, wash your hair first as usual with your normal shampoo.  After you shampoo, rinse your hair and body thoroughly to remove the shampoo.  Use CHG Soap as you would any other liquid soap. You can apply CHG directly to the skin and wash gently with a scrungie or a clean washcloth.   Apply the CHG Soap to your body ONLY  FROM THE NECK DOWN.  Do not use on open wounds or open sores. Avoid contact with your eyes, ears, mouth and genitals (private parts). Wash Face and genitals (private parts)  with your normal soap.   Wash thoroughly, paying special attention to the area where your surgery will be performed.  Thoroughly rinse your body with warm water from the neck down.  DO NOT shower/wash with your normal soap after using and rinsing off the CHG Soap.  Pat yourself dry with a CLEAN TOWEL.  Wear CLEAN PAJAMAS to bed the night before surgery  Place CLEAN SHEETS on your bed the night before your surgery  DO NOT SLEEP WITH PETS.   Day of Surgery: Take a shower with CHG soap. Do not wear jewelry. Do not wear lotions, powders, colognes, or deodorant. Men may shave face and neck. Do not bring valuables to  the hospital.  [facility] is not responsible for any belongings or valuables. Wear Clean/Comfortable clothing the morning of surgery Remember to brush your teeth WITH YOUR REGULAR TOOTHPASTE.   Please read over the following fact sheets that you were given.    If you received a COVID test during your pre-op visit  it is requested that you wear a mask when out in public, stay away from anyone that may not be feeling well and notify your surgeon if you develop symptoms. If you have been in contact with anyone that has tested positive in the last 10 days please notify you surgeon.

## 2022-11-03 NOTE — Progress Notes (Signed)
PCP: Dr. [name] Cardiologist: :Werner Lean, MD / Dr. Radford Pax  EKG: 10-16-2022 CXR: 01-09-2022 ECHO: 1--03-2022 Stress Test: 2013 Cardiac Cath: 2007  Fasting Blood Sugar- reports 110-120's Checks Blood Sugar twice weekly Of note: pt denies DM diagnosis Last A1C= 7.1 on 10/26/2022  Labs: 10/26/2021  Eliquis: Stop 3 days prior. Reports LD will be Thursday PM dose  Patient denies shortness of breath, fever, cough, and chest pain at PAT appointment.  Patient verbalized understanding of instructions provided today at the PAT appointment.  Patient asked to review instructions at home and day of surgery.

## 2022-11-04 NOTE — Progress Notes (Signed)
Anesthesia Chart Review:  Follows with cardiology for history of paroxysmal atrial fibrillation on Eliquis, HTN, moderate CAD by cath 2007, ascending aortic dilatation (4.2 cm by CT 10/2021), OSA on CPAP.  Most recent nuclear stress 07/2012 was low risk.Echo 10/2021 showed EF 50 to 55%, grade 2 DD, mild MR, moderate dilation of ascending aorta measuring 44 mm.  Cardiac clearance per telephone encounter 10/21/2022 by Christen Bame, NP, "Chart reviewed as part of pre-operative protocol coverage. Given past medical history and time since last visit, based on ACC/AHA guidelines, Brent Taylor would be at acceptable risk for the planned procedure without further cardiovascular testing. I called to verify no change in patient's medical history. Patient was advised that if he develops new symptoms prior to surgery to contact our office to arrange a follow-up appointment.  He verbalized understanding. Previous clearance to hold Eliquis for 3 days prior to [facility] 1219/23. He resumed Eliquis the day following that procedure and has had no change in medical history since that time. Advised him to again hold Eliquis for 3 days prior to lumbar decompression and resume as soon as hemodynamically stable following the procedure."  Pt reports LD Eqliuis 11/04/22.  Hiatal hernia noted on CTA chest 10/2021.  Diet controleld DM2, A1c 7.1 on 10/26/22.  BMP 10/28/22 reviewed, unremarkable. CBC 10/26/22 reviewed, unremarkable.   EKG 01/09/22: Atrial fibrillation. Rate 110. Ventricular premature complex. Abnormal R-wave progression, early transition. LVH with IVCD, LAD and secondary repol abnrm  CTA chest 11/05/2021: IMPRESSION: Greatest estimated diameter of the ascending aorta 4.2 cm. Recommend annual imaging followup by CTA or MRA. This recommendation follows 2010 ACCF/AHA/AATS/ACR/ASA/SCA/SCAI/SIR/STS/SVM Guidelines for the Diagnosis and Management of Patients with Thoracic Aortic Disease. Circulation. 2010; 121: O536-U440.  Aortic aneurysm NOS (ICD10-I71.9)   Single nodule of 6 mm within the right middle lobe. Non-contrast chest CT at 6-12 months is recommended. If the nodule is stable at time of repeat CT, then future CT at 18-24 months (from today's scan) is considered optional for low-risk patients, but is recommended for high-risk patients. This recommendation follows the consensus statement: Guidelines for Management of Incidental Pulmonary Nodules Detected on CT Images: From the Fleischner Society 2017; Radiology 2017; 284:228-243.   Hiatal hernia.  Event monitor 10/22/2021: Predominant underlying rhythm was sinus rhythm Multiple runs of SVT, longest and fastest 1 minute 33 seconds at an average of 161 bpm 24.8% supraventricular ectopy Less than 1% ventricular ectopy Symptoms of weakness associated with sinus rhythm and atrial ectopy  TTE 10/16/2021:  1. Left ventricular ejection fraction, by estimation, is 50 to 55%. The  left ventricle has low normal function. The left ventricle has no regional  wall motion abnormalities. There is mild-to-moderate concentric left  ventricular hypertrophy. Left  ventricular diastolic parameters are consistent with Grade II diastolic  dysfunction (pseudonormalization).   2. Right ventricular systolic function is normal. The right ventricular  size is normal.   3. Left atrial size was moderately dilated.   4. The mitral valve is grossly normal. Mild mitral valve regurgitation.   5. The aortic valve is tricuspid. There is mild calcification of the  aortic valve. There is mild thickening of the aortic valve. Aortic valve  regurgitation is not visualized. Aortic valve sclerosis/calcification is  present, without any evidence of  aortic stenosis.   6. Aortic dilatation noted. There is moderate dilatation of the ascending  aorta, measuring 44 mm.   Comparison(s): Compared to prior echo report in 10/2020, the ascending  aorta remains dilated. Currently measures 81m  and was previously reported  at 75m, however, the aorta is better visualized on current study and this  does not necessarily represent  interval dilation since prior study. Would recommend CTA for further  evaluation.     JWynonia MustyMHealthbridge Children'[facility]OrangeShort Stay Center/Anesthesiology Phone ([phone]/25/2024 3:56 PM

## 2022-11-04 NOTE — Anesthesia Preprocedure Evaluation (Addendum)
Anesthesia Evaluation  Patient identified by MRN, date of birth, ID band Patient awake    Reviewed: Allergy & Precautions, NPO status , Patient's Chart, lab work & pertinent test results, reviewed documented beta blocker date and time   History of Anesthesia Complications Negative for: history of anesthetic complications  Airway Mallampati: I  TM Distance: >3 FB Neck ROM: Full    Dental  (+) Missing, Caps, Dental Advisory Given   Pulmonary sleep apnea and Continuous Positive Airway Pressure Ventilation    breath sounds clear to auscultation       Cardiovascular hypertension, Pt. on medications and Pt. on home beta blockers (-) angina + dysrhythmias Atrial Fibrillation  Rhythm:Irregular Rate:Normal  10/2021 ECHO: EF 50-55%. The LV has low normal function, no regional wall motion abnormalities. There is mild-to-moderate concentric LVH. Grade II diastolic  dysfunction (pseudonormalization). RVF is normal, mild MR     Neuro/Psych    Depression    Back and leg pain: tramadol    GI/Hepatic Neg liver ROS,GERD  Controlled and Medicated,,  Endo/Other  diabetes (diet management, glu 128)    Renal/GU negative Renal ROS     Musculoskeletal   Abdominal  (+) + obese  Peds  Hematology eliquis   Anesthesia Other Findings   Reproductive/Obstetrics                             Anesthesia Physical Anesthesia Plan  ASA: 3  Anesthesia Plan: General   Post-op Pain Management: Tylenol PO (pre-op)*   Induction: Intravenous  PONV Risk Score and Plan: 2 and Ondansetron and Dexamethasone  Airway Management Planned: Oral ETT  Additional Equipment: None  Intra-op Plan:   Post-operative Plan: Extubation in OR  Informed Consent: I have reviewed the patients History and Physical, chart, labs and discussed the procedure including the risks, benefits and alternatives for the proposed anesthesia with the patient  or authorized representative who has indicated his/her understanding and acceptance.     Dental advisory given  Plan Discussed with: CRNA and Surgeon  Anesthesia Plan Comments: (PAT note by Karoline Caldwell, PA-C:  Follows with cardiology for history of paroxysmal atrial fibrillation on Eliquis, HTN, moderate CAD by cath 2007, ascending aortic dilatation (4.2 cm by CT 10/2021), OSA on CPAP.  Most recent nuclear stress 07/2012 was low risk.Echo 10/2021 showed EF 50 to 55%, grade 2 DD, mild MR, moderate dilation of ascending aorta measuring 44 mm.  Cardiac clearance per telephone encounter 10/21/2022 by Christen Bame, NP, "Chart reviewed as part of pre-operative protocol coverage. Given past medical history and time since last visit, based on ACC/AHA guidelines,Brent Whitewould be at acceptable risk for the planned procedure without further cardiovascular testing. I called to verify no change in patient's medical history.Patient was advised that if he develops new symptoms prior to surgery to contact our office to arrange a follow-up appointment. He verbalized understanding. Previous clearance to hold Eliquis for 3 days prior to [facility] 1219/23. He resumed Eliquis the day following that procedure and has had no change in medical history since that time. Advised him to again hold Eliquis for 3 days prior to lumbar decompression and resume as soon as hemodynamically stable following the procedure."  Pt reports LD Eqliuis 11/04/22.  Hiatal hernia noted on CTA chest 10/2021.  Diet controleld DM2, A1c 7.1 on 10/26/22.  BMP 10/28/22 reviewed, unremarkable. CBC 10/26/22 reviewed, unremarkable.   EKG 01/09/22: Atrial fibrillation. Rate 110. Ventricular premature complex. Abnormal R-wave progression,  early transition. LVH with IVCD, LAD and secondary repol abnrm  CTA chest 11/05/2021: IMPRESSION: Greatest estimated diameter of the ascending aorta 4.2 cm. Recommend annual imaging followup by CTA or MRA. This  recommendation follows 2010 ACCF/AHA/AATS/ACR/ASA/SCA/SCAI/SIR/STS/SVM Guidelines for the Diagnosis and Management of Patients with Thoracic Aortic Disease. Circulation. 2010; 121: E280-K349. Aortic aneurysm NOS (ICD10-I71.9)  Single nodule of 6 mm within the right middle lobe. Non-contrast chest CT at 6-12 months is recommended. If the nodule is stable at time of repeat CT, then future CT at 18-24 months (from today's scan) is considered optional for low-risk patients, but is recommended for high-risk patients. This recommendation follows the consensus statement: Guidelines for Management of Incidental Pulmonary Nodules Detected on CT Images: From the Fleischner Society 2017; Radiology 2017; 284:228-243.  Hiatal hernia.  Event monitor 10/22/2021: Predominant underlying rhythm was sinus rhythm Multiple runs of SVT, longest and fastest 1 minute 33 seconds at an average of 161 bpm 24.8% supraventricular ectopy Less than 1% ventricular ectopy Symptoms of weakness associated with sinus rhythm and atrial ectopy  TTE 10/16/2021: 1. Left ventricular ejection fraction, by estimation, is 50 to 55%. The  left ventricle has low normal function. The left ventricle has no regional  wall motion abnormalities. There is mild-to-moderate concentric left  ventricular hypertrophy. Left  ventricular diastolic parameters are consistent with Grade II diastolic  dysfunction (pseudonormalization).  2. Right ventricular systolic function is normal. The right ventricular  size is normal.  3. Left atrial size was moderately dilated.  4. The mitral valve is grossly normal. Mild mitral valve regurgitation.  5. The aortic valve is tricuspid. There is mild calcification of the  aortic valve. There is mild thickening of the aortic valve. Aortic valve  regurgitation is not visualized. Aortic valve sclerosis/calcification is  present, without any evidence of  aortic stenosis.  6. Aortic dilatation noted.  There is moderate dilatation of the ascending  aorta, measuring 44 mm.   Comparison(s): Compared to prior echo report in 10/2020, the ascending  aorta remains dilated. Currently measures 82m and was previously reported  at 458m however, the aorta is better visualized on current study and this  does not necessarily represent  interval dilation since prior study. Would recommend CTA for further  evaluation.   )        Anesthesia Quick Evaluation

## 2022-11-05 ENCOUNTER — Inpatient Hospital Stay: Admission: RE | Admit: 2022-11-05 | Payer: Medicare HMO | Source: Ambulatory Visit

## 2022-11-05 DIAGNOSIS — M5416 Radiculopathy, lumbar region: Secondary | ICD-10-CM | POA: Diagnosis not present

## 2022-11-05 DIAGNOSIS — M545 Low back pain, unspecified: Secondary | ICD-10-CM | POA: Diagnosis not present

## 2022-11-08 ENCOUNTER — Inpatient Hospital Stay (HOSPITAL_COMMUNITY): Payer: Medicare HMO | Admitting: Physician Assistant

## 2022-11-08 ENCOUNTER — Inpatient Hospital Stay (HOSPITAL_COMMUNITY): Payer: Medicare HMO

## 2022-11-08 ENCOUNTER — Other Ambulatory Visit: Payer: Self-pay | Admitting: Internal Medicine

## 2022-11-08 ENCOUNTER — Encounter (HOSPITAL_COMMUNITY)
Admission: RE | Disposition: A | Discharge status: Home / Self Care | Payer: Self-pay | Source: Home / Self Care | Attending: Orthopedic Surgery

## 2022-11-08 ENCOUNTER — Other Ambulatory Visit: Payer: Self-pay

## 2022-11-08 ENCOUNTER — Inpatient Hospital Stay (HOSPITAL_COMMUNITY)
Admission: RE | Admit: 2022-11-08 | Discharge: 2022-11-09 | DRG: 458 | Disposition: A | Discharge status: Home / Self Care | Payer: Medicare HMO | Attending: Orthopedic Surgery | Admitting: Orthopedic Surgery

## 2022-11-08 ENCOUNTER — Encounter (HOSPITAL_COMMUNITY): Payer: Self-pay | Admitting: Orthopedic Surgery

## 2022-11-08 DIAGNOSIS — M4800 Spinal stenosis, site unspecified: Secondary | ICD-10-CM | POA: Diagnosis not present

## 2022-11-08 DIAGNOSIS — Z79899 Other long term (current) drug therapy: Secondary | ICD-10-CM

## 2022-11-08 DIAGNOSIS — E1142 Type 2 diabetes mellitus with diabetic polyneuropathy: Secondary | ICD-10-CM | POA: Diagnosis present

## 2022-11-08 DIAGNOSIS — Z85820 Personal history of malignant melanoma of skin: Secondary | ICD-10-CM

## 2022-11-08 DIAGNOSIS — G4730 Sleep apnea, unspecified: Secondary | ICD-10-CM | POA: Diagnosis present

## 2022-11-08 DIAGNOSIS — G629 Polyneuropathy, unspecified: Secondary | ICD-10-CM | POA: Diagnosis not present

## 2022-11-08 DIAGNOSIS — I10 Essential (primary) hypertension: Secondary | ICD-10-CM | POA: Diagnosis present

## 2022-11-08 DIAGNOSIS — M4156 Other secondary scoliosis, lumbar region: Secondary | ICD-10-CM | POA: Diagnosis not present

## 2022-11-08 DIAGNOSIS — E785 Hyperlipidemia, unspecified: Secondary | ICD-10-CM | POA: Diagnosis not present

## 2022-11-08 DIAGNOSIS — I2510 Atherosclerotic heart disease of native coronary artery without angina pectoris: Secondary | ICD-10-CM

## 2022-11-08 DIAGNOSIS — M48062 Spinal stenosis, lumbar region with neurogenic claudication: Principal | ICD-10-CM | POA: Diagnosis present

## 2022-11-08 DIAGNOSIS — M791 Myalgia, unspecified site: Secondary | ICD-10-CM | POA: Diagnosis not present

## 2022-11-08 DIAGNOSIS — I480 Paroxysmal atrial fibrillation: Secondary | ICD-10-CM | POA: Diagnosis present

## 2022-11-08 DIAGNOSIS — Z7901 Long term (current) use of anticoagulants: Secondary | ICD-10-CM | POA: Diagnosis not present

## 2022-11-08 DIAGNOSIS — K219 Gastro-esophageal reflux disease without esophagitis: Secondary | ICD-10-CM | POA: Diagnosis present

## 2022-11-08 DIAGNOSIS — Z6832 Body mass index (BMI) 32.0-32.9, adult: Secondary | ICD-10-CM | POA: Diagnosis not present

## 2022-11-08 DIAGNOSIS — Z86010 Personal history of colonic polyps: Secondary | ICD-10-CM

## 2022-11-08 DIAGNOSIS — M48061 Spinal stenosis, lumbar region without neurogenic claudication: Secondary | ICD-10-CM

## 2022-11-08 DIAGNOSIS — M4316 Spondylolisthesis, lumbar region: Secondary | ICD-10-CM

## 2022-11-08 DIAGNOSIS — Z87440 Personal history of urinary (tract) infections: Secondary | ICD-10-CM

## 2022-11-08 DIAGNOSIS — E1159 Type 2 diabetes mellitus with other circulatory complications: Secondary | ICD-10-CM

## 2022-11-08 DIAGNOSIS — Z981 Arthrodesis status: Secondary | ICD-10-CM | POA: Diagnosis not present

## 2022-11-08 DIAGNOSIS — Z888 Allergy status to other drugs, medicaments and biological substances status: Secondary | ICD-10-CM | POA: Diagnosis not present

## 2022-11-08 DIAGNOSIS — E669 Obesity, unspecified: Secondary | ICD-10-CM | POA: Diagnosis not present

## 2022-11-08 DIAGNOSIS — G4733 Obstructive sleep apnea (adult) (pediatric): Secondary | ICD-10-CM | POA: Diagnosis not present

## 2022-11-08 DIAGNOSIS — Z9989 Dependence on other enabling machines and devices: Secondary | ICD-10-CM | POA: Diagnosis not present

## 2022-11-08 HISTORY — PX: LUMBAR LAMINECTOMY/DECOMPRESSION MICRODISCECTOMY: SHX5026

## 2022-11-08 LAB — GLUCOSE, CAPILLARY
Glucose-Capillary: 128 mg/dL — ABNORMAL HIGH (ref 70–99)
Glucose-Capillary: 114 mg/dL — ABNORMAL HIGH (ref 70–99)
Glucose-Capillary: 130 mg/dL — ABNORMAL HIGH (ref 70–99)
Glucose-Capillary: 205 mg/dL — ABNORMAL HIGH (ref 70–99)

## 2022-11-08 LAB — ABO/RH: ABO/RH(D): B POS

## 2022-11-08 SURGERY — LUMBAR LAMINECTOMY/DECOMPRESSION MICRODISCECTOMY 1 LEVEL
Anesthesia: General | Site: Spine Lumbar

## 2022-11-08 MED ORDER — LIDOCAINE 2% (20 MG/ML) 5 ML SYRINGE
INTRAMUSCULAR | Status: AC
  Filled 2022-11-08: qty 5

## 2022-11-08 MED ORDER — ONDANSETRON HCL 4 MG/2ML IJ SOLN
INTRAMUSCULAR | Status: DC | PRN
  Administered 2022-11-08: 4 mg via INTRAVENOUS

## 2022-11-08 MED ORDER — INSULIN ASPART 100 UNIT/ML IJ SOLN: 0.0000 [IU] | Freq: Three times a day (TID) | INTRAMUSCULAR | Status: DC

## 2022-11-08 MED ORDER — OXYCODONE HCL 5 MG PO TABS
5.0000 mg | ORAL_TABLET | ORAL | Status: DC | PRN
  Administered 2022-11-09: 5 mg via ORAL
  Filled 2022-11-08: qty 1

## 2022-11-08 MED ORDER — BUPIVACAINE-EPINEPHRINE 0.25% -1:200000 IJ SOLN
INTRAMUSCULAR | Status: DC | PRN
  Administered 2022-11-08: 10 mL

## 2022-11-08 MED ORDER — LIDOCAINE 2% (20 MG/ML) 5 ML SYRINGE
INTRAMUSCULAR | Status: DC | PRN
  Administered 2022-11-08: 80 mg via INTRAVENOUS

## 2022-11-08 MED ORDER — PROPOFOL 10 MG/ML IV BOLUS
INTRAVENOUS | Status: DC | PRN
  Administered 2022-11-08: 130 mg via INTRAVENOUS

## 2022-11-08 MED ORDER — ACETAMINOPHEN 650 MG RE SUPP: 650.0000 mg | RECTAL | Status: DC | PRN

## 2022-11-08 MED ORDER — PHENOL 1.4 % MT LIQD: 1.0000 | OROMUCOSAL | Status: DC | PRN

## 2022-11-08 MED ORDER — MEPERIDINE HCL 25 MG/ML IJ SOLN: 6.2500 mg | INTRAMUSCULAR | Status: DC | PRN

## 2022-11-08 MED ORDER — ROCURONIUM BROMIDE 10 MG/ML (PF) SYRINGE
PREFILLED_SYRINGE | INTRAVENOUS | Status: AC
  Filled 2022-11-08: qty 10

## 2022-11-08 MED ORDER — THROMBIN 20000 UNITS EX SOLR: CUTANEOUS | Status: DC | PRN

## 2022-11-08 MED ORDER — CEFAZOLIN SODIUM-DEXTROSE 2-4 GM/100ML-% IV SOLN
2.0000 g | INTRAVENOUS | Status: AC
  Administered 2022-11-08: 2 g via INTRAVENOUS
  Filled 2022-11-08: qty 100

## 2022-11-08 MED ORDER — FENTANYL CITRATE (PF) 250 MCG/5ML IJ SOLN
INTRAMUSCULAR | Status: AC
  Filled 2022-11-08: qty 5

## 2022-11-08 MED ORDER — ROCURONIUM BROMIDE 10 MG/ML (PF) SYRINGE
PREFILLED_SYRINGE | INTRAVENOUS | Status: DC | PRN
  Administered 2022-11-08: 60 mg via INTRAVENOUS
  Administered 2022-11-08 (×2): 10 mg via INTRAVENOUS

## 2022-11-08 MED ORDER — TRANEXAMIC ACID-NACL 1000-0.7 MG/100ML-% IV SOLN
INTRAVENOUS | Status: DC | PRN
  Administered 2022-11-08: 1000 mg via INTRAVENOUS

## 2022-11-08 MED ORDER — 0.9 % SODIUM CHLORIDE (POUR BTL) OPTIME
TOPICAL | Status: DC | PRN
  Administered 2022-11-08: 1000 mL

## 2022-11-08 MED ORDER — INSULIN ASPART 100 UNIT/ML IJ SOLN: 0.0000 [IU] | INTRAMUSCULAR | Status: DC | PRN

## 2022-11-08 MED ORDER — INSULIN ASPART 100 UNIT/ML IJ SOLN: 0.0000 [IU] | Freq: Every day | INTRAMUSCULAR | Status: DC

## 2022-11-08 MED ORDER — FLUOXETINE HCL 20 MG PO CAPS: 40.0000 mg | ORAL_CAPSULE | Freq: Every day | ORAL | Status: DC

## 2022-11-08 MED ORDER — PHENYLEPHRINE 80 MCG/ML (10ML) SYRINGE FOR IV PUSH (FOR BLOOD PRESSURE SUPPORT)
PREFILLED_SYRINGE | INTRAVENOUS | Status: DC | PRN
  Administered 2022-11-08 (×2): 80 ug via INTRAVENOUS
  Administered 2022-11-08 (×3): 120 ug via INTRAVENOUS

## 2022-11-08 MED ORDER — PROPOFOL 10 MG/ML IV BOLUS
INTRAVENOUS | Status: AC
  Filled 2022-11-08: qty 20

## 2022-11-08 MED ORDER — ACETAMINOPHEN 325 MG PO TABS
650.0000 mg | ORAL_TABLET | ORAL | Status: DC | PRN
  Filled 2022-11-08: qty 2

## 2022-11-08 MED ORDER — PHENYLEPHRINE 80 MCG/ML (10ML) SYRINGE FOR IV PUSH (FOR BLOOD PRESSURE SUPPORT)
PREFILLED_SYRINGE | INTRAVENOUS | Status: AC
  Filled 2022-11-08: qty 10

## 2022-11-08 MED ORDER — LACTATED RINGERS IV SOLN: INTRAVENOUS | Status: DC

## 2022-11-08 MED ORDER — ONDANSETRON HCL 4 MG PO TABS: 4.0000 mg | ORAL_TABLET | Freq: Four times a day (QID) | ORAL | Status: DC | PRN

## 2022-11-08 MED ORDER — MENTHOL 3 MG MT LOZG: 1.0000 | LOZENGE | OROMUCOSAL | Status: DC | PRN

## 2022-11-08 MED ORDER — METHYLPREDNISOLONE ACETATE 40 MG/ML IJ SUSP
INTRAMUSCULAR | Status: AC
  Filled 2022-11-08: qty 1

## 2022-11-08 MED ORDER — SODIUM CHLORIDE 0.9 % IV SOLN: 250.0000 mL | INTRAVENOUS | Status: DC

## 2022-11-08 MED ORDER — CEFAZOLIN SODIUM-DEXTROSE 1-4 GM/50ML-% IV SOLN
1.0000 g | Freq: Three times a day (TID) | INTRAVENOUS | Status: AC
  Administered 2022-11-08 – 2022-11-09 (×2): 1 g via INTRAVENOUS
  Filled 2022-11-08 (×2): qty 50

## 2022-11-08 MED ORDER — HYDROMORPHONE HCL 1 MG/ML IJ SOLN: 0.2500 mg | INTRAMUSCULAR | Status: DC | PRN

## 2022-11-08 MED ORDER — ACETAMINOPHEN 500 MG PO TABS
1000.0000 mg | ORAL_TABLET | Freq: Once | ORAL | Status: AC
  Administered 2022-11-08: 1000 mg via ORAL
  Filled 2022-11-08: qty 2

## 2022-11-08 MED ORDER — ONDANSETRON HCL 4 MG/2ML IJ SOLN
INTRAMUSCULAR | Status: AC
  Filled 2022-11-08: qty 2

## 2022-11-08 MED ORDER — MIDAZOLAM HCL 2 MG/2ML IJ SOLN: 0.5000 mg | Freq: Once | INTRAMUSCULAR | Status: DC | PRN

## 2022-11-08 MED ORDER — ONDANSETRON HCL 4 MG PO TABS: 4.0000 mg | ORAL_TABLET | Freq: Three times a day (TID) | ORAL | 0 refills | Status: DC | PRN

## 2022-11-08 MED ORDER — CEFAZOLIN SODIUM-DEXTROSE 1-4 GM/50ML-% IV SOLN: 1.0000 g | Freq: Three times a day (TID) | INTRAVENOUS | Status: DC

## 2022-11-08 MED ORDER — ONDANSETRON HCL 4 MG/2ML IJ SOLN: 4.0000 mg | Freq: Four times a day (QID) | INTRAMUSCULAR | Status: DC | PRN

## 2022-11-08 MED ORDER — MAGNESIUM CITRATE PO SOLN: 1.0000 | Freq: Once | ORAL | Status: DC | PRN

## 2022-11-08 MED ORDER — LOSARTAN POTASSIUM 50 MG PO TABS
50.0000 mg | ORAL_TABLET | Freq: Every day | ORAL | Status: DC
  Administered 2022-11-08: 50 mg via ORAL
  Filled 2022-11-08: qty 1

## 2022-11-08 MED ORDER — METHOCARBAMOL 1000 MG/10ML IJ SOLN: 500.0000 mg | Freq: Four times a day (QID) | INTRAVENOUS | Status: DC | PRN

## 2022-11-08 MED ORDER — ALBUMIN HUMAN 5 % IV SOLN: INTRAVENOUS | Status: DC | PRN

## 2022-11-08 MED ORDER — EPINEPHRINE PF 1 MG/ML IJ SOLN
INTRAMUSCULAR | Status: AC
  Filled 2022-11-08: qty 1

## 2022-11-08 MED ORDER — OXYCODONE HCL 5 MG PO TABS: 5.0000 mg | ORAL_TABLET | Freq: Once | ORAL | Status: DC | PRN

## 2022-11-08 MED ORDER — POLYETHYLENE GLYCOL 3350 17 G PO PACK
17.0000 g | PACK | Freq: Every day | ORAL | Status: DC | PRN
  Administered 2022-11-08: 17 g via ORAL
  Filled 2022-11-08: qty 1

## 2022-11-08 MED ORDER — HYDROMORPHONE HCL 1 MG/ML IJ SOLN: 0.5000 mg | INTRAMUSCULAR | Status: DC | PRN

## 2022-11-08 MED ORDER — ORAL CARE MOUTH RINSE: 15.0000 mL | Freq: Once | OROMUCOSAL | Status: AC

## 2022-11-08 MED ORDER — PROMETHAZINE HCL 25 MG/ML IJ SOLN: 6.2500 mg | INTRAMUSCULAR | Status: DC | PRN

## 2022-11-08 MED ORDER — DEXAMETHASONE SODIUM PHOSPHATE 10 MG/ML IJ SOLN
INTRAMUSCULAR | Status: DC | PRN
  Administered 2022-11-08: 5 mg via INTRAVENOUS

## 2022-11-08 MED ORDER — OXYCODONE HCL 5 MG PO TABS: 10.0000 mg | ORAL_TABLET | ORAL | Status: DC | PRN

## 2022-11-08 MED ORDER — METHOCARBAMOL 500 MG PO TABS: 500.0000 mg | ORAL_TABLET | Freq: Four times a day (QID) | ORAL | Status: DC | PRN

## 2022-11-08 MED ORDER — SUGAMMADEX SODIUM 200 MG/2ML IV SOLN
INTRAVENOUS | Status: DC | PRN
  Administered 2022-11-08: 200 mg via INTRAVENOUS

## 2022-11-08 MED ORDER — OXYCODONE-ACETAMINOPHEN 10-325 MG PO TABS: 1.0000 | ORAL_TABLET | Freq: Four times a day (QID) | ORAL | 0 refills | Status: AC | PRN

## 2022-11-08 MED ORDER — FENTANYL CITRATE (PF) 250 MCG/5ML IJ SOLN
INTRAMUSCULAR | Status: DC | PRN
  Administered 2022-11-08: 100 ug via INTRAVENOUS
  Administered 2022-11-08: 50 ug via INTRAVENOUS

## 2022-11-08 MED ORDER — SODIUM CHLORIDE 0.9% FLUSH: 3.0000 mL | INTRAVENOUS | Status: DC | PRN

## 2022-11-08 MED ORDER — THROMBIN 20000 UNITS EX SOLR
CUTANEOUS | Status: AC
  Filled 2022-11-08: qty 20000

## 2022-11-08 MED ORDER — PRAVASTATIN SODIUM 40 MG PO TABS
80.0000 mg | ORAL_TABLET | Freq: Every evening | ORAL | Status: DC
  Administered 2022-11-08: 80 mg via ORAL
  Filled 2022-11-08: qty 2

## 2022-11-08 MED ORDER — METHOCARBAMOL 500 MG PO TABS: 500.0000 mg | ORAL_TABLET | Freq: Three times a day (TID) | ORAL | 0 refills | Status: AC | PRN

## 2022-11-08 MED ORDER — METOPROLOL TARTRATE 25 MG PO TABS
25.0000 mg | ORAL_TABLET | Freq: Two times a day (BID) | ORAL | Status: DC
  Administered 2022-11-08: 12.5 mg via ORAL
  Filled 2022-11-08: qty 1

## 2022-11-08 MED ORDER — SODIUM CHLORIDE 0.9% FLUSH: 3.0000 mL | Freq: Two times a day (BID) | INTRAVENOUS | Status: DC

## 2022-11-08 MED ORDER — PHENYLEPHRINE HCL-NACL 20-0.9 MG/250ML-% IV SOLN
INTRAVENOUS | Status: DC | PRN
  Administered 2022-11-08: 40 ug/min via INTRAVENOUS

## 2022-11-08 MED ORDER — DEXAMETHASONE SODIUM PHOSPHATE 10 MG/ML IJ SOLN
INTRAMUSCULAR | Status: AC
  Filled 2022-11-08: qty 1

## 2022-11-08 MED ORDER — TRANEXAMIC ACID-NACL 1000-0.7 MG/100ML-% IV SOLN
INTRAVENOUS | Status: AC
  Filled 2022-11-08: qty 100

## 2022-11-08 MED ORDER — CHLORHEXIDINE GLUCONATE 0.12 % MT SOLN
15.0000 mL | Freq: Once | OROMUCOSAL | Status: AC
  Administered 2022-11-08: 15 mL via OROMUCOSAL
  Filled 2022-11-08: qty 15

## 2022-11-08 MED ORDER — OXYCODONE HCL 5 MG/5ML PO SOLN: 5.0000 mg | Freq: Once | ORAL | Status: DC | PRN

## 2022-11-08 MED ORDER — BUPIVACAINE HCL (PF) 0.25 % IJ SOLN
INTRAMUSCULAR | Status: AC
  Filled 2022-11-08: qty 30

## 2022-11-08 SURGICAL SUPPLY — 58 items
BAG COUNTER SPONGE SURGICOUNT (BAG) ×1 IMPLANT
BNDG GAUZE DERMACEA FLUFF 4 (GAUZE/BANDAGES/DRESSINGS) ×1 IMPLANT
BUR EGG ELITE 4.0 (BURR) IMPLANT
CANISTER SUCT 3000ML PPV (MISCELLANEOUS) ×1 IMPLANT
CLSR STERI-STRIP ANTIMIC 1/2X4 (GAUZE/BANDAGES/DRESSINGS) ×1 IMPLANT
CNTNR URN SCR LID CUP LEK RST (MISCELLANEOUS) IMPLANT
CONT SPEC 4OZ STRL OR WHT (MISCELLANEOUS) ×1
CORD BIPOLAR FORCEPS 12FT (ELECTRODE) ×1 IMPLANT
COVER SURGICAL LIGHT HANDLE (MISCELLANEOUS) ×1 IMPLANT
DRAIN CHANNEL 15F RND FF W/TCR (WOUND CARE) IMPLANT
DRAPE SURG 17X23 STRL (DRAPES) ×1 IMPLANT
DRAPE U-SHAPE 47X51 STRL (DRAPES) ×1 IMPLANT
DRSG OPSITE POSTOP 3X4 (GAUZE/BANDAGES/DRESSINGS) ×1 IMPLANT
DRSG OPSITE POSTOP 4X6 (GAUZE/BANDAGES/DRESSINGS) IMPLANT
DURAPREP 26ML APPLICATOR (WOUND CARE) ×1 IMPLANT
ELECT BLADE 4.0 EZ CLEAN MEGAD (MISCELLANEOUS)
ELECT CAUTERY BLADE 6.4 (BLADE) ×1 IMPLANT
ELECT PENCIL ROCKER SW 15FT (MISCELLANEOUS) ×1 IMPLANT
ELECT REM PT RETURN 9FT ADLT (ELECTROSURGICAL) ×1
ELECTRODE BLDE 4.0 EZ CLN MEGD (MISCELLANEOUS) IMPLANT
ELECTRODE REM PT RTRN 9FT ADLT (ELECTROSURGICAL) ×1 IMPLANT
EVACUATOR SILICONE 100CC (DRAIN) IMPLANT
GLOVE BIO SURGEON STRL SZ 6.5 (GLOVE) ×1 IMPLANT
GLOVE BIOGEL PI IND STRL 6.5 (GLOVE) ×1 IMPLANT
GLOVE BIOGEL PI IND STRL 8.5 (GLOVE) ×1 IMPLANT
GLOVE SS BIOGEL STRL SZ 8.5 (GLOVE) ×1 IMPLANT
GOWN STRL REUS W/ TWL LRG LVL3 (GOWN DISPOSABLE) ×2 IMPLANT
GOWN STRL REUS W/TWL 2XL LVL3 (GOWN DISPOSABLE) ×1 IMPLANT
GOWN STRL REUS W/TWL LRG LVL3 (GOWN DISPOSABLE) ×2
KIT BASIN OR (CUSTOM PROCEDURE TRAY) ×1 IMPLANT
KIT TURNOVER KIT B (KITS) ×1 IMPLANT
NDL 22X1.5 STRL (OR ONLY) (MISCELLANEOUS) ×1 IMPLANT
NDL SPNL 18GX3.5 QUINCKE PK (NEEDLE) ×2 IMPLANT
NEEDLE 22X1.5 STRL (OR ONLY) (MISCELLANEOUS) ×1 IMPLANT
NEEDLE SPNL 18GX3.5 QUINCKE PK (NEEDLE) ×2 IMPLANT
NS IRRIG 1000ML POUR BTL (IV SOLUTION) ×1 IMPLANT
PACK LAMINECTOMY ORTHO (CUSTOM PROCEDURE TRAY) ×1 IMPLANT
PACK UNIVERSAL I (CUSTOM PROCEDURE TRAY) ×1 IMPLANT
PAD ARMBOARD 7.5X6 YLW CONV (MISCELLANEOUS) ×2 IMPLANT
PATTIES SURGICAL .5 X.5 (GAUZE/BANDAGES/DRESSINGS) ×1 IMPLANT
PATTIES SURGICAL .5 X1 (DISPOSABLE) ×1 IMPLANT
SPONGE SURGIFOAM ABS GEL 100 (HEMOSTASIS) IMPLANT
SPONGE T-LAP 4X18 ~~LOC~~+RFID (SPONGE) ×3 IMPLANT
SURGIFLO W/THROMBIN 8M KIT (HEMOSTASIS) IMPLANT
SUT BONE WAX W31G (SUTURE) ×1 IMPLANT
SUT MNCRL+ AB 3-0 CT1 36 (SUTURE) ×1 IMPLANT
SUT MONOCRYL AB 3-0 CT1 36IN (SUTURE) ×1
SUT STRATAFIX 1PDS 45CM VIOLET (SUTURE) IMPLANT
SUT VIC AB 1 CT1 18XCR BRD 8 (SUTURE) ×1 IMPLANT
SUT VIC AB 1 CT1 8-18 (SUTURE) ×1
SUT VIC AB 2-0 CT1 18 (SUTURE) ×1 IMPLANT
SYR BULB IRRIG 60ML STRL (SYRINGE) ×1 IMPLANT
SYR CONTROL 10ML LL (SYRINGE) ×1 IMPLANT
SYR TB 1ML LUER SLIP (SYRINGE) IMPLANT
TOWEL GREEN STERILE (TOWEL DISPOSABLE) ×1 IMPLANT
TOWEL GREEN STERILE FF (TOWEL DISPOSABLE) ×1 IMPLANT
WATER STERILE IRR 1000ML POUR (IV SOLUTION) ×1 IMPLANT
YANKAUER SUCT BULB TIP NO VENT (SUCTIONS) IMPLANT

## 2022-11-08 NOTE — Transfer of Care (Signed)
Immediate Anesthesia Transfer of Care Note  Patient: Brent Taylor  Procedure(s) Performed: LUMBAR FOUR FIVE DECOMPRESSION AND IN SITU FUSION (Spine Lumbar)  Patient Location: PACU  Anesthesia Type:General  Level of Consciousness: drowsy and patient cooperative  Airway & Oxygen Therapy: Patient Spontanous Breathing  Post-op Assessment: Report given to RN and Post -op Vital signs reviewed and stable  Post vital signs: Reviewed and stable  Last Vitals:  Vitals Value Taken Time  BP 116/79   Temp 98.5   Pulse 80   Resp 15   SpO2 96     Last Pain:  Vitals:   11/08/22 1119  TempSrc:   PainSc: 2       Patients Stated Pain Goal: 3 (06/00/45 9977)  Complications: No notable events documented.

## 2022-11-08 NOTE — Anesthesia Procedure Notes (Signed)
Procedure Name: Intubation Date/Time: 11/08/2022 2:19 PM  Performed by: Heide Scales, CRNAPre-anesthesia Checklist: Patient identified, Emergency Drugs available, Suction available and Patient being monitored Patient Re-evaluated:Patient Re-evaluated prior to induction Oxygen Delivery Method: Circle system utilized Preoxygenation: Pre-oxygenation with 100% oxygen Induction Type: IV induction and Cricoid Pressure applied Ventilation: Mask ventilation without difficulty and Oral airway inserted - appropriate to patient size Laryngoscope Size: Mac and 4 Grade View: Grade I Tube type: Oral Tube size: 7.5 mm Number of attempts: 1 Airway Equipment and Method: Stylet and Oral airway Placement Confirmation: ETT inserted through vocal cords under direct vision, positive ETCO2 and breath sounds checked- equal and bilateral Secured at: 24 cm Tube secured with: Tape Dental Injury: Teeth and Oropharynx as per pre-operative assessment

## 2022-11-08 NOTE — Op Note (Signed)
OPERATIVE REPORT  DATE OF SURGERY: 11/08/2022  PATIENT NAME:  Brent Taylor MRN: [id number] DOB: 1940-10-21  PCP: [name], MD  PRE-OPERATIVE DIAGNOSIS: Degenerative spondylolisthesis/scoliosis L4-5.  Positive neurogenic claudication.  POST-OPERATIVE DIAGNOSIS: Same  PROCEDURE: Lumbar decompression L4-5 with in situ fusion  SURGEON:  Melina Schools, MD  PHYSICIAN ASSISTANT: None  ANESTHESIA:   General  EBL: 119 ml   Complications: None  Implants: None  Graft: Autograft from decompression  BRIEF HISTORY: Brent Taylor is a 82 y.o. male who presented to my care with significant complaints of back buttock and neuropathic leg pain.  Patient had degenerative scoliosis with slight anterior listhesis and severe spinal stenosis at L4-5.  Despite attempts at conservative management the patient's back buttock and neuropathic leg pain continue to progress.  As result of the failure to improve we elected to move forward with a lumbar decompression with in situ fusion.  All appropriate risks, benefits, alternatives were discussed with the patient and consent was obtained.  PROCEDURE DETAILS: Patient was brought into the operating room and was properly positioned on the operating room table.  After induction with general anesthesia the patient was endotracheally intubated.  A timeout was taken to confirm all important data: including patient, procedure, and the level. Teds, SCD's were applied.   Patient was turned prone onto the Wilson frame and all bony prominences were well-padded.  The back was then prepped and draped in a standard fashion.  2 needles were placed in the back and an x-ray was taken for localization of the skin incision.  Once we localized the L4-5 level I marked out my incision site and infiltrated with quarter percent Marcaine with epinephrine.  Midline incision was made and sharp dissection was carried out down to the deep fascia.  Deep fascia was sharply incised and stripped  the paraspinal muscles using Bovie and Cobb bilaterally to expose the L4-5 spinous processes in the facet complex.  A Penfield 4 was placed underneath the L4 lamina and a second x-ray was taken and read by the radiologist confirming that we are at the appropriate level.  Double-action Leksell rongeur was used to remove the bulk of the L4 spinous process and a portion of that of L5.  Kerrison rongeurs were used to perform a generous laminotomy.  There was significant osteophyte formation especially in the lateral recess as well as significant facet arthropathy contributing to the neural compression.  I was able to use my Penfield 4 to dissect through the central raphae of the ligamentum flavum and create a plane between the ligamentum flavum and the thecal sac.  2 and 3 mm Kerrison rongeurs were used to remove the central portion of the ligamentum flavum to expose the thecal sac.  I continued my laminotomy superiorly until I was at the level of the L4 pedicle.  I then continued inferiorly until I was at the level of the L5 pedicle.  I then gently dissected into the lateral recess with the Penfield 4 gently creating a plane between the thecal sac and the ligamentum flavum/osteophyte.  A 1 mm and 2 mm Kerrison rongeur were used to start my medial facetectomy.  I then used 3 mm Kerrison to completely decompress the lateral recess.  I ultimately identified the L5 nerve root protected and then continued my medial facetectomy and medial foraminotomies.  Epidural veins were encountered and coagulated with bipolar electrocautery.  I then went superiorly removing any thickened ligamentum flavum and bone spur until I could palpate the inferior  aspect of the L4 pedicle.  At this point I decompressed from pedicle to pedicle.  Medial facetectomy was created so I had an adequate pression of the lateral recess.  I was also able to resect the medial portion of the foramen of L5 with my Kerrison rongeurs to complete the  foraminotomies.  The leading edge of the L5 lamina was also taken down to adequately decompress the area.  I confirmed that my decompression went from the L5 foramen up to the L4 foramen thereby spanning the area of maximum compression was seen on the MRI.  With the right lateral recess and foraminal decompression complete I then went to the contralateral side and began doing the same decompression this time on the left side.  Again working across the table I was able to gently dissect and create a plane between the medial facet/osteophyte and the thecal sac using the same technique of Kerrison rongeurs and gentle duction with a Penfield 4 I was able to perform a medial foraminotomies of L5 and a medial facetectomy.  I also continued superiorly removing the thickened ligamentum flavum and osteophyte until the thecal sac now expanded into the space.  At this point again I could palpate from the L4 to the L5 pedicle and into the L5 foramen confirming I had spanned the area of maximum compression.  At this point time I then checked and coagulated all the exposed epidural veins in order to obtain hemostasis.  I irrigated the wound copiously normal saline and with my nerve hook I was able to palpate superiorly to the L4-5 pedicle inferiorly around the L5 pedicle into the L5 foramen and medially over the annulus to confirm an adequate decompression.  Once this was done I then decorticated the L4-5 facet complex as well as the L4 transverse process.  Bone graft that had harvested from the decompression was then placed in the posterior lateral gutters bilaterally.  After final irrigation I again confirmed hemostasis and did 1 last check to ensure there was no significant stenosis or further nerve compression.  Once compression was confirmed I removed all my retractors and closed the deep fascia with a running #1 strata fix suture.  I then closed the layer with a running Vicryl suture, followed by interrupted 2-0 Vicryl  sutures.  The skin was reapproximated with 3-0 Monocryl.  Steri-Strips dry dressing were applied and the patient was ultimately extubated and transferred the PACU without incident.  The end of the case all needle sponge counts were correct.  There were no adverse intraoperative events.  Melina Schools, MD 11/08/2022 5:28 PM

## 2022-11-08 NOTE — Brief Op Note (Signed)
11/08/2022  5:36 PM  PATIENT:  Brent Taylor  82 y.o. male  PRE-OPERATIVE DIAGNOSIS:  Lumbar spinal stenosis with spondylolisthesis  POST-OPERATIVE DIAGNOSIS:  Lumbar spinal stenosis with spondylolisthesis  PROCEDURE:  Procedure(s) with comments: LUMBAR FOUR FIVE DECOMPRESSION AND IN SITU FUSION (N/A) - 123mn  SURGEON:  Surgeon(s) and Role:    *Melina Schools MD - Primary  PHYSICIAN ASSISTANT:   ASSISTANTS: none   ANESTHESIA:   general  EBL:  150 mL   BLOOD ADMINISTERED:none  DRAINS: none   LOCAL MEDICATIONS USED:  MARCAINE     SPECIMEN:  No Specimen  DISPOSITION OF SPECIMEN:  N/A  COUNTS:  YES  TOURNIQUET:  * No tourniquets in log *  DICTATION: .Dragon Dictation  PLAN OF CARE: Admit for overnight observation  PATIENT DISPOSITION:  PACU - hemodynamically stable.

## 2022-11-08 NOTE — H&P (Signed)
History:  Brent Taylor continues to have severe back buttock and bilateral lower extremity pain. Clinical exam is consistent with lumbar spinal stenosis with neurogenic claudication. As result of the failure of conservative management to improve his quality of life we have elected to move forward with a lumbar decompression L4-5 with in situ fusion.  Past Medical History:  Diagnosis Date   Atrial fibrillation ([place])    Cancer ([place])    hx - melanoma   Chicken pox    Colon polyps    Coronary artery disease    Depression    No need for therapy.  Improved   Diabetes ([facility])    Diverticula, colon    Dyspnea    with exertion   GERD (gastroesophageal reflux disease)    Hemorrhoids    Hyperlipidemia    Hypertension    Sepsis ([place])    Sepsis ([place])    Sleep apnea    Tinnitus    UTI (lower urinary tract infection)     Allergies  Allergen Reactions   Lipitor [Atorvastatin Calcium]     Muscle ache    No current facility-administered medications on file prior to encounter.   Current Outpatient Medications on File Prior to Encounter  Medication Sig Dispense Refill   apixaban (ELIQUIS) 5 MG TABS tablet Take 1 tablet (5 mg total) by mouth 2 (two) times daily. 60 tablet 5   Cyanocobalamin (B-12 PO) Take 1 tablet by mouth daily.     FLUoxetine (PROZAC) 40 MG capsule TAKE ONE CAPSULE BY MOUTH DAILY 90 capsule 3   fluticasone (FLONASE) 50 MCG/ACT nasal spray Place 1 spray into both nostrils daily as needed for allergies or rhinitis.     losartan (COZAAR) 50 MG tablet TAKE 1 TABLET BY MOUTH DAILY 90 tablet 0   MAGNESIUM PO Take 1 tablet by mouth daily.     methocarbamol (ROBAXIN) 500 MG tablet Take 500 mg by mouth every 6 (six) hours as needed for muscle spasms.     metoprolol tartrate (LOPRESSOR) 25 MG tablet TAKE 1/2 TABLET BY MOUTH TWICE A DAY 90 tablet 3   NON FORMULARY Pt uses a cpap nightly     Omega-3 Fatty Acids (FISH OIL PO) Take 1 capsule by mouth daily.     pantoprazole  (PROTONIX) 20 MG tablet TAKE ONE TABLET BY MOUTH DAILY 90 tablet 3   traMADol (ULTRAM) 50 MG tablet Take 2 tablets (100 mg total) by mouth every 12 (twelve) hours as needed. 60 tablet 5   trolamine salicylate (BLUE-EMU HEMP) 10 % cream Apply 1 Application topically as needed for muscle pain.     TURMERIC PO Take 1 capsule by mouth daily.     Blood Glucose Monitoring Suppl (ONE TOUCH ULTRA 2) w/Device KIT Use daily to check blood sugar. 1 kit 0   Lancets (ONETOUCH ULTRASOFT) lancets USE TO TEST BLOOD SUGAR DAILY 100 each 12   ONETOUCH ULTRA test strip USE TO TEST BLOOD SUGAR DAILY AS DIRECTED 100 strip 3    Physical Exam: Vitals:   11/08/22 1106  BP: (!) 132/96  Pulse: 95  Resp: 18  Temp: 97.8 F (36.6 C)  SpO2: 96%   Body mass index is 32.69 kg/m. Brent Taylor is a pleasant individual, who appears younger than their stated age.  He Is alert and orientated 3.  No shortness of breath, chest pain.  Abdomen is soft and non-tender, negative loss of bowel and bladder control, no rebound tenderness.  Negative: skin lesions  abrasions contusions  Peripheral pulses: 2+ dorsalis pedis/posterior tibialis pulses bilaterally. Compartment soft and nontender.  Gait pattern: Normal gait pattern but he does stand in a forward flexed posture for pain relief.  Assistive devices: walker  Neuro: Positive bilateral dysesthesias in the hamstring consistent with neurogenic claudication. No focal motor deficits in the lower extremity. Negative nerve root tension signs. Negative Babinski test, no clonus, 1+ deep tendon reflexes at the knee and Achilles   Musculoskeletal: Significant back pain when he is in the neutral or extended position. Patient notes improvement in pain with forward flexion. No SI joint pain. No significant hip, knee, ankle pain with isolated joint range of motion.  X-rays of the lumbar spine: grade 1 degenerative anterolisthesis L4 on 5. Significant facet arthrosis is noted. No fracture  is seen.  Lumbar MRI: completed on 10/16/2022. Multilevel degenerative changes consistent with DISH. Essentially unchanged from his prior MRI of 03/05/2020. Continues to have moderate to severe central canal stenosis with severe lateral recess stenosis at L4-5. This affects the exiting L4 and traversing L5 nerve root. Mild to moderate stenosis at the remainder of the levels. Positive anterior listhesis noted at L4-5.   A/P: Summary: Brent Taylor continues to have severe back buttock and bilateral neuropathic leg pain. Clinical exam is consistent with lumbar spinal stenosis with neurogenic claudication. He did note he has seen some improvement with the oral prednisone that I provided him with at his last visit. Based on the MRI I believe his primary pain generator is the spinal stenosis at L4-5. While he does have other areas of degeneration I think the maximal amount of compression is at L4-5 and he would benefit from the single level lumbar decompression. Because of the existing instability pattern (spondylolisthesis) I would then supplement this with an in situ fusion. I do not think he requires instrumentation. Given his age I believe the best option is a L4-5 decompression with in situ fusion. I have reviewed this with the patient and his wife and all their questions were addressed. He is in agreement with the surgical plan.  Risks and benefits of decompression/discectomy: Infection, bleeding, death, stroke, paralysis, ongoing or worse pain, need for additional surgery, leak of spinal fluid, adjacent segment degeneration requiring additional surgery, post-operative hematoma formation that can result in neurological compromise and the need for urgent/emergent re-operation. Loss in bowel and bladder control. Injury to major vessels that could result in the need for urgent abdominal surgery to stop bleeding. Risk of deep venous thrombosis (DVT) and the need for additional treatment. Recurrent disc herniation  resulting in the need for revision surgery, which could include fusion surgery (utilizing instrumentation such as pedicle screws and intervertebral cages). Additional risk: If instrumentation is used there is a risk of migration, or breakage of that hardware that could require additional surgery.

## 2022-11-08 NOTE — Discharge Instructions (Addendum)
Laminectomy, Care After This sheet gives you information about how to care for yourself after your procedure. Your health care provider may also give you more specific instructions. If you have problems or questions, contact your health care provider. What can I expect after the procedure? After the procedure, it is common to have: Some pain around your incision area. Muscle tightening (spasms) across the back.   Follow these instructions at home: Incision care Follow instructions from your health care provider about how to take care of your incision area. Make sure you: Wash your hands with soap and water before and after you apply medicine to the area or change your bandage (dressing). If soap and water are not available, use hand sanitizer. Change your dressing as told by your health care provider. Leave stitches (sutures), skin glue, or adhesive strips in place. These skin closures may need to stay in place for 2 weeks or longer. If adhesive strip edges start to loosen and curl up, you may trim the loose edges. Do not remove adhesive strips completely unless your health care provider tells you to do that.  Check your incision area every day for signs of infection. Check for: More redness, swelling, or pain. More fluid or blood. Warmth. Pus or a bad smell. Medicines Take over-the-counter and prescription medicines only as told by your health care provider. If you were prescribed an antibiotic medicine, use it as told by your health care provider. Do not stop using the antibiotic even if you start to feel better. If needed, call office in 3 days to request refill of pain medications. Bathing Do not take baths, swim, or use a hot tub for 6 weeks, or until your incision has healed completely. If your health care provider approves, you may take showers after your dressing has been removed. Ok to shower in 5 days Activity Return to your normal activities as told by your health care  provider. Ask your health care provider what activities are safe for you. Avoid bending or twisting at your waist. Always bend at your knees. Do not sit for more than 20-30 minutes at a time. Lie down or walk between periods of sitting. Do not lift anything that is heavier than 10 lb (4.5 kg) or the limit that your health care provider tells you, until he or she says that it is safe. Do not drive for 2 weeks after your procedure or for as long as your health care provider tells you.  Do not drive or use heavy machinery while taking prescription pain medicine. General instructions To prevent or treat constipation while you are taking prescription pain medicine, your health care provider may recommend that you: Drink enough fluid to keep your urine clear or pale yellow. Take over-the-counter or prescription medicines. Eat foods that are high in fiber, such as fresh fruits and vegetables, whole grains, and beans. Limit foods that are high in fat and processed sugars, such as fried and sweet foods. Do breathing exercises as told. Keep all follow-up visits as told by your health care provider. This is important. Contact a health care provider if: You have more redness, swelling, or pain around your incision area. Your incision feels warm to the touch. You are not able to return to activities or do exercises as told by your health care provider. Get help right away if: You have: More fluid or blood coming from your incision area. Pus or a bad smell coming from your incision area. Chills or a fever.   Episodes of dizziness or fainting while standing. You develop a rash. You develop shortness of breath or you have difficulty breathing. You cannot control when you urinate or have a bowel movement. You become weak. You are not able to use your legs. Summary After the procedure, it is common to have some pain around your incision area. You may also have muscle tightening (spasms) across the  back. Follow instructions from your health care provider about how to care for your incision. Do not lift anything that is heavier than 10 lb (4.5 kg) or the limit that your health care provider tells you, until he or she says that it is safe. Contact your health care provider if you have more redness, swelling, or pain around your incision area or if your incision feels warm to the touch. These can be signs of infection. This information is not intended to replace advice given to you by your health care provider. Make sure you discuss any questions you have with your health care provider. Refer to this sheet in the next few weeks. These instructions provide you with information about caring for yourself after your procedure. Your health care provider may also give you more specific instructions. Your treatment has been planned according to current medical practices, but problems sometimes occur. Call your health care provider if you have any problems or questions after your procedure. What can I expect after the procedure? It is common to have pain for the first few days after the procedure. Some people continue to have mild pain even after making a full recovery. Follow these instructions at home: Medicine Take medicines only as directed by your health care provider. Avoid taking over-the-counter pain medicines unless your health care provider tells you otherwise. These medicines interfere with the development and growth of new bone cells. If you were prescribed a narcotic pain medicine, take it exactly as told by your health care provider. Do not drink alcohol while on the medicine. Do not drive while on the medicine. Injury care Care for your back brace as told by your health care provider. If directed, apply ice to the injured area: Put ice in a plastic bag. Place a towel between your skin and the bag. Leave the ice on for 20 minutes, 2-3 times a day. Activity Perform physical therapy  exercises as told by your health care provider. Exercise regularly. Start by taking short walks. Slowly increase your activity level over time. Gentle exercise helps to ease pain. Sit, stand, walk, turn in bed, and reposition yourself as told by your health care provider. This will help to keep your spine in proper alignment. Avoid bending and twisting your body. Avoid doing strenuous household chores, such as vacuuming. Do not lift anything that is heavier than 10 lb (4.5 kg). Other Instructions Keep all follow-up visits as directed by your health care provider. This is important. Do not use any tobacco products, including cigarettes, chewing tobacco, or electronic cigarettes. If you need help quitting, ask your health care provider. Nicotine affects the way bones heal. Contact a health care provider if: Your pain gets worse. You have a fever. You have redness, swelling, or pain at the site of your incision. You have fluid, blood, or pus coming from your incision. You have numbness, tingling, or weakness in any part of your body. Get help right away if: Your incision feels swollen and tender, and the surrounding area looks like a lump. The lump may be red or bluish in color. You   cannot move any part of your body (paralysis). You cannot control your bladder or bowels.  RESTART ELIQUIS ON THURSDAY 11/11/22. CALL IF YOU HAVE BLEEDING OR LOSS IN BOWEL/BLADDER CONTROL

## 2022-11-08 NOTE — OR Nursing (Signed)
Radiology report given by radiologist Dr. Barbie Banner. Report states that x-ray confirms lumbar level 4-5.

## 2022-11-09 ENCOUNTER — Other Ambulatory Visit: Payer: Self-pay | Admitting: Internal Medicine

## 2022-11-09 ENCOUNTER — Encounter (HOSPITAL_COMMUNITY): Payer: Self-pay | Admitting: Orthopedic Surgery

## 2022-11-09 DIAGNOSIS — I480 Paroxysmal atrial fibrillation: Secondary | ICD-10-CM

## 2022-11-09 LAB — GLUCOSE, CAPILLARY: Glucose-Capillary: 120 mg/dL — ABNORMAL HIGH (ref 70–99)

## 2022-11-09 MED ORDER — INSULIN ASPART 100 UNIT/ML IJ SOLN
0.0000 [IU] | Freq: Three times a day (TID) | INTRAMUSCULAR | Status: DC
  Administered 2022-11-09: 3 [IU] via SUBCUTANEOUS

## 2022-11-09 NOTE — Telephone Encounter (Signed)
Prescription refill request for Eliquis received. Indication:afib Last office visit:12/23 Scr:1.1 10/28/22 Age: 82 Weight:109.3  kg  Prescription refilled

## 2022-11-09 NOTE — Evaluation (Signed)
Physical Therapy Evaluation  Patient Details Name: Brent Taylor MRN: [id number] DOB: [birth year] Today's Date: 11/09/2022  History of Present Illness  Pt is an 82 y/o male who presents s/p L4-L5 lumbar decompression with in situ fusion on 11/08/2022.  PMH includes: A-fib, HTN, DM, CA, Depression.   Clinical Impression  Pt admitted with above diagnosis. At the time of PT eval, pt was able to demonstrate transfers and ambulation with gross min guard assist to min assist and RW for support. Pt was educated on precautions, brace application/wearing schedule, appropriate activity progression, and car transfer. Gait belt issued for family use especially on stairs. Pt currently with functional limitations due to the deficits listed below (see PT Problem List). Pt will benefit from skilled PT to increase their independence and safety with mobility to allow discharge to the venue listed below.         Recommendations for follow up therapy are one component of a multi-disciplinary discharge planning process, led by the attending physician.  Recommendations may be updated based on patient status, additional functional criteria and insurance authorization.  Follow Up Recommendations No PT follow up      Assistance Recommended at Discharge PRN  Patient can return home with the following  A little help with walking and/or transfers;A little help with bathing/dressing/bathroom;Assistance with cooking/housework;Assist for transportation;Help with stairs or ramp for entrance    Equipment Recommendations Rolling walker (2 wheels) (Gait belt)  Recommendations for Other Services       Functional Status Assessment Patient has had a recent decline in their functional status and demonstrates the ability to make significant improvements in function in a reasonable and predictable amount of time.     Precautions / Restrictions Precautions Precautions: Back;Fall Precaution Booklet Issued: Yes (comment) Precaution  Comments: Reviewed handout with pt and family and pt was cued for precautions during functional mobility. Required Braces or Orthoses: Spinal Brace Spinal Brace: Lumbar corset;Applied in sitting position Restrictions Weight Bearing Restrictions: No      Mobility  Bed Mobility               General bed mobility comments: Pt was received sitting up EOB awaiting PT.    Transfers Overall transfer level: Needs assistance Equipment used: Rolling walker (2 wheels) Transfers: Sit to/from Stand Sit to Stand: Min guard           General transfer comment: Hands on guarding for safety as pt powered up to full stand. No assist required but pt required increased time and effort.    Ambulation/Gait Ambulation/Gait assistance: Min guard, Min assist Gait Distance (Feet): 300 Feet Assistive device: Rolling walker (2 wheels) Gait Pattern/deviations: Step-through pattern, Decreased stride length, Trunk flexed, Narrow base of support Gait velocity: Decreased Gait velocity interpretation: 1.31 - 2.62 ft/sec, indicative of limited community ambulator   General Gait Details: VC's throughout for improved posture, closer walker proximity, and forward gaze. Occasional posterior LOB requiring assist to recover.  Stairs Stairs: Yes Stairs assistance: Min guard Stair Management: Step to pattern, Forwards, One rail Left Number of Stairs: 5 General stair comments: VC's for sequencing and general safety. Pt with effortful negotiation of stairs and increased risk for falls; recommend pt stay on main level until first follow up appointment.  Wheelchair Mobility    Modified Rankin (Stroke Patients Only)       Balance Overall balance assessment: Needs assistance Sitting-balance support: Feet supported Sitting balance-Leahy Scale: Good     Standing balance support: Reliant on assistive device for balance  Standing balance-Leahy Scale: Poor Standing balance comment: Posterior LOB with static  standing.                             Pertinent Vitals/Pain Pain Assessment Pain Assessment: Faces Faces Pain Scale: Hurts little more Pain Location: B hips; incision site Pain Descriptors / Indicators: Aching, Operative site guarding Pain Intervention(s): Limited activity within patient's tolerance, Monitored during session, Repositioned    Home Living Family/patient expects to be discharged to:: Private residence Living Arrangements: Spouse/significant other Available Help at Discharge: Family;Available 24 hours/day Type of Home: House Home Access: Stairs to enter Entrance Stairs-Rails: Right;Left;Can reach both Entrance Stairs-Number of Steps: 5 Alternate Level Stairs-Number of Steps: 15 Home Layout: Multi-level;Able to live on main level with bedroom/bathroom Home Equipment: Kasandra Knudsen - single point;Adaptive equipment;Shower seat;Grab bars - tub/shower      Prior Function Prior Level of Function : Independent/Modified Independent;Driving             Mobility Comments: Cane at baseline ADLs Comments: Occasional assist with ADL depending on back pain     Hand Dominance   Dominant Hand: Right    Extremity/Trunk Assessment   Upper Extremity Assessment Upper Extremity Assessment: Defer to OT evaluation    Lower Extremity Assessment Lower Extremity Assessment: Generalized weakness (Consistent with pre-op diagnosis)    Cervical / Trunk Assessment Cervical / Trunk Assessment: Back Surgery  Communication   Communication: No difficulties  Cognition Arousal/Alertness: Awake/alert Behavior During Therapy: WFL for tasks assessed/performed Overall Cognitive Status: Within Functional Limits for tasks assessed                                          General Comments      Exercises     Assessment/Plan    PT Assessment Patient needs continued PT services  PT Problem List Decreased strength;Decreased activity tolerance;Decreased  balance;Decreased mobility;Decreased knowledge of use of DME;Decreased safety awareness;Decreased knowledge of precautions;Pain       PT Treatment Interventions DME instruction;Gait training;Stair training;Functional mobility training;Therapeutic activities;Therapeutic exercise;Balance training;Patient/family education    PT Goals (Current goals can be found in the Care Plan section)  Acute Rehab PT Goals Patient Stated Goal: Home today, be able to go upstairs to his TV room PT Goal Formulation: With patient/family Time For Goal Achievement: 11/16/22 Potential to Achieve Goals: Good    Frequency Min 5X/week     Co-evaluation               AM-PAC PT "6 Clicks" Mobility  Outcome Measure Help needed turning from your back to your side while in a flat bed without using bedrails?: A Little Help needed moving from lying on your back to sitting on the side of a flat bed without using bedrails?: A Little Help needed moving to and from a bed to a chair (including a wheelchair)?: A Little Help needed standing up from a chair using your arms (e.g., wheelchair or bedside chair)?: A Little Help needed to walk in hospital room?: A Little Help needed climbing 3-5 steps with a railing? : A Little 6 Click Score: 18    End of Session Equipment Utilized During Treatment: Gait belt;Back brace Activity Tolerance: Patient tolerated treatment well Patient left: in bed;with call bell/phone within reach;with family/visitor present Nurse Communication: Mobility status PT Visit Diagnosis: Unsteadiness on feet (R26.81);Pain Pain - part  of body:  (back)    Time: 3235-5732 PT Time Calculation (min) (ACUTE ONLY): 24 min   Charges:   PT Evaluation $PT Eval Low Complexity: 1 Low PT Treatments $Gait Training: 8-22 mins        Brent Taylor, PT, DPT Acute Rehabilitation Services Secure Chat Preferred Office: [phone]   Brent Taylor 11/09/2022, 10:01 AM

## 2022-11-09 NOTE — Evaluation (Signed)
Occupational Therapy Evaluation Patient Details Name: Brent Taylor MRN: [id number] DOB: 1941-10-10 Today's Date: 11/09/2022   History of Present Illness 82 yo M s/p PLIF.  PMH includes: A-fib, HTN, DM, CA, Depression.   Clinical Impression   Patient admitted for the diagnosis and procedure above.  PTA he lives at home with his spouse, who can provide supportive assist.  His son's live nearby, and can provide physical assist if needed.  Patient was needing increasing assist due to back pain, but currently feels better, and is moving better.  Patient needing supervision for bed mobility, and mobility in the room/toileting.  He was able to complete ADL from a sit to stand level, but did need Min Guard for sit to stand.  Back precautions reviewed, ADL completed, and patient verbalizes understanding.  No further needs in the acute setting, and no post acute OT anticipated.  Recommend follow up with MD as prescribed.        Recommendations for follow up therapy are one component of a multi-disciplinary discharge planning process, led by the attending physician.  Recommendations may be updated based on patient status, additional functional criteria and insurance authorization.   Follow Up Recommendations  No OT follow up     Assistance Recommended at Discharge Intermittent Supervision/Assistance  Patient can return home with the following Assist for transportation;Assistance with cooking/housework    Functional Status Assessment  Patient has had a recent decline in their functional status and demonstrates the ability to make significant improvements in function in a reasonable and predictable amount of time.  Equipment Recommendations  None recommended by OT    Recommendations for Other Services       Precautions / Restrictions Precautions Precautions: Back Precaution Booklet Issued: Yes (comment) Precaution Comments: Reviewed Required Braces or Orthoses: Spinal Brace Spinal Brace: Lumbar  corset Restrictions Weight Bearing Restrictions: No      Mobility Bed Mobility Overal bed mobility: Needs Assistance Bed Mobility: Sidelying to Sit, Sit to Sidelying   Sidelying to sit: Supervision     Sit to sidelying: Supervision General bed mobility comments: Verbal cues as needed    Transfers Overall transfer level: Needs assistance Equipment used: Rolling walker (2 wheels) Transfers: Sit to/from Stand Sit to Stand: Supervision, Min guard           General transfer comment: increased difficulty from bed noted, cues for hand placement      Balance Overall balance assessment: Needs assistance Sitting-balance support: Feet supported Sitting balance-Leahy Scale: Good     Standing balance support: Reliant on assistive device for balance Standing balance-Leahy Scale: Fair Standing balance comment: able to static stand for pant management                           ADL either performed or assessed with clinical judgement   ADL Overall ADL's : At baseline                                             Vision Baseline Vision/History: 1 Wears glasses Patient Visual Report: No change from baseline       Perception     Praxis      Pertinent Vitals/Pain Pain Assessment Pain Assessment: Faces Faces Pain Scale: Hurts a little bit Pain Location: B hips Pain Descriptors / Indicators: Aching Pain Intervention(s): Monitored during session  Hand Dominance Right   Extremity/Trunk Assessment Upper Extremity Assessment Upper Extremity Assessment: Overall WFL for tasks assessed   Lower Extremity Assessment Lower Extremity Assessment: Defer to PT evaluation   Cervical / Trunk Assessment Cervical / Trunk Assessment: Back Surgery   Communication Communication Communication: No difficulties   Cognition Arousal/Alertness: Awake/alert Behavior During Therapy: WFL for tasks assessed/performed Overall Cognitive Status: Within  Functional Limits for tasks assessed                                                        Home Living Family/patient expects to be discharged to:: Private residence Living Arrangements: Spouse/significant other Available Help at Discharge: Family;Available 24 hours/day Type of Home: House Home Access: Stairs to enter [employer] of Steps: 5 Entrance Stairs-Rails: Right;Left;Can reach both Home Layout: Multi-level;Able to live on main level with bedroom/bathroom   Alternate Level Stairs-Rails: Right Bathroom Shower/Tub: Tub/shower unit;Walk-in shower   Bathroom Toilet: Standard Bathroom Accessibility: Yes How Accessible: Accessible via walker Home Equipment: Cane - single point;Adaptive equipment;Shower seat;Grab bars - Production designer, theatre/television/film: Long-handled shoe horn        Prior Functioning/Environment Prior Level of Function : Independent/Modified Independent;Driving             Mobility Comments: Cane at baseline ADLs Comments: Occasional assist with ADL depending on back pain        OT Problem List: Pain      OT Treatment/Interventions:      OT Goals(Current goals can be found in the care plan section) Acute Rehab OT Goals Patient Stated Goal: Return home OT Goal Formulation: With patient Time For Goal Achievement: 11/12/22 Potential to Achieve Goals: Good  OT Frequency:      Co-evaluation              AM-PAC OT "6 Clicks" Daily Activity     Outcome Measure Help from another person eating meals?: None Help from another person taking care of personal grooming?: None Help from another person toileting, which includes using toliet, bedpan, or urinal?: A Little Help from another person bathing (including washing, rinsing, drying)?: A Little Help from another person to put on and taking off regular upper body clothing?: None Help from another person to put on and taking off regular lower body clothing?: A  Little 6 Click Score: 21   End of Session Equipment Utilized During Treatment: Rolling walker (2 wheels) Nurse Communication: Mobility status  Activity Tolerance: Patient tolerated treatment well Patient left: in bed;with call bell/phone within reach;with family/visitor present  OT Visit Diagnosis: Unsteadiness on feet (R26.81);Pain Pain - Right/Left:  (Incisional)                Time: 0254-2706 OT Time Calculation (min): 24 min Charges:  OT General Charges $OT Visit: 1 Visit OT Evaluation $OT Eval Moderate Complexity: 1 Mod OT Treatments $Self Care/Home Management : 8-22 mins  11/09/2022  RP, OTR/L  Acute Rehabilitation Services  Office:  [phone]   Metta Clines 11/09/2022, 9:16 AM

## 2022-11-09 NOTE — Anesthesia Postprocedure Evaluation (Signed)
Anesthesia Post Note  Patient: Brent Taylor  Procedure(s) Performed: LUMBAR FOUR FIVE DECOMPRESSION AND IN SITU FUSION (Spine Lumbar)     Patient location during evaluation: PACU Anesthesia Type: General Level of consciousness: awake and alert Pain management: pain level controlled Vital Signs Assessment: post-procedure vital signs reviewed and stable Respiratory status: spontaneous breathing, nonlabored ventilation, respiratory function stable and patient connected to nasal cannula oxygen Cardiovascular status: blood pressure returned to baseline and stable Postop Assessment: no apparent nausea or vomiting Anesthetic complications: no   No notable events documented.  Last Vitals:  Vitals:   11/08/22 2353 11/09/22 0451  BP: (!) 141/85 112/77  Pulse: (!) 52 87  Resp: 20 20  Temp: 36.6 C 36.8 C  SpO2: 98% 96%    Last Pain:  Vitals:   11/09/22 0451  TempSrc: Oral  PainSc:                  Santa Lighter

## 2022-11-09 NOTE — Plan of Care (Signed)
Pt doing well. Pt and family given D/C instructions with verbal understanding. Rx's were given to the Pt at D/C. Pt's incision is clean and dry with no sign of infection. Pt's IV was removed prior to D/C. Pt received RW from [employer] per MD order. Pt D/C'd home via wheelchair per MD order. Pt is stable @ D/C and has no other needs at this time. Holli Humbles, RN

## 2022-11-09 NOTE — Discharge Summary (Signed)
Patient ID: Brent Taylor MRN: [id number] DOB/AGE: 1941-04-20 82 y.o.  Admit date: 11/08/2022 Discharge date: 11/09/2022  Admission Diagnoses:  Principal Problem:   Spinal stenosis   Discharge Diagnoses:  Principal Problem:   Spinal stenosis  status post Procedure(s): LUMBAR FOUR FIVE DECOMPRESSION AND IN SITU FUSION  Past Medical History:  Diagnosis Date   Atrial fibrillation ([place])    Cancer ([place])    hx - melanoma   Chicken pox    Colon polyps    Coronary artery disease    Depression    No need for therapy.  Improved   Diabetes ([facility])    Diverticula, colon    Dyspnea    with exertion   GERD (gastroesophageal reflux disease)    Hemorrhoids    Hyperlipidemia    Hypertension    Sepsis ([place])    Sepsis ([place])    Sleep apnea    Tinnitus    UTI (lower urinary tract infection)     Surgeries: Procedure(s): LUMBAR FOUR FIVE DECOMPRESSION AND IN SITU FUSION on 11/08/2022   Consultants:   Discharged Condition: Improved  Hospital Course: Brent Taylor is an 82 y.o. male who was admitted 11/08/2022 for operative treatment of Spinal stenosis. Patient failed conservative treatments (please see the history and physical for the specifics) and had severe unremitting pain that affects sleep, daily activities and work/hobbies. After pre-op clearance, the patient was taken to the operating room on 11/08/2022 and underwent  Procedure(s): LUMBAR FOUR FIVE DECOMPRESSION AND IN SITU FUSION.    Patient was given perioperative antibiotics:  Anti-infectives (From admission, onward)    Start     Dose/Rate Route Frequency Ordered Stop   11/08/22 2200  ceFAZolin (ANCEF) IVPB 1 g/50 mL premix        1 g 100 mL/hr over 30 Minutes Intravenous Every 8 hours 11/08/22 1838 11/09/22 0549   11/08/22 1930  ceFAZolin (ANCEF) IVPB 1 g/50 mL premix  Status:  Discontinued        1 g 100 mL/hr over 30 Minutes Intravenous Every 8 hours 11/08/22 1837 11/08/22 1838   11/08/22 1230  ceFAZolin (ANCEF) IVPB  2g/100 mL premix        2 g 200 mL/hr over 30 Minutes Intravenous 30 min pre-op 11/08/22 1105 11/08/22 1435        Patient was given sequential compression devices and early ambulation to prevent DVT.   Patient benefited maximally from hospital stay and there were no complications. At the time of discharge, the patient was urinating/moving their bowels without difficulty, tolerating a regular diet, pain is controlled with oral pain medications and they have been cleared by PT/OT.   Recent vital signs: Patient Vitals for the past 24 hrs:  BP Temp Temp src Pulse Resp SpO2 Height Weight  11/09/22 0451 112/77 98.2 F (36.8 C) Oral 87 20 96 % -- --  11/08/22 2353 (!) 141/85 97.8 F (36.6 C) Oral (!) 52 20 98 % -- --  11/08/22 2016 116/85 98.1 F (36.7 C) Oral 94 -- 96 % -- --  11/08/22 1834 (!) 134/99 (!) 97.5 F (36.4 C) -- 93 20 96 % -- --  11/08/22 1810 113/75 98 F (36.7 C) -- 95 16 94 % -- --  11/08/22 1755 104/69 -- -- 82 18 93 % -- --  11/08/22 1740 116/79 98 F (36.7 C) -- 81 16 96 % -- --  11/08/22 1106 (!) 132/96 97.8 F (36.6 C) Oral 95 18 96 % 6' (1.829 m) 109.3  kg     Recent laboratory studies: No results for input(s): "WBC", "HGB", "HCT", "PLT", "NA", "K", "CL", "CO2", "BUN", "CREATININE", "GLUCOSE", "INR", "CALCIUM" in the last 72 hours.  Invalid input(s): "PT", "2"   Discharge Medications:   Allergies as of 11/09/2022       Reactions   Lipitor [atorvastatin Calcium]    Muscle ache        Medication List     STOP taking these medications    Blue-Emu Hemp 10 % cream Generic drug: trolamine salicylate   NON FORMULARY   traMADol 50 MG tablet Commonly known as: ULTRAM   TURMERIC PO       TAKE these medications    apixaban 5 MG Tabs tablet Commonly known as: Eliquis Take 1 tablet (5 mg total) by mouth 2 (two) times daily.   B-12 PO Take 1 tablet by mouth daily.   FISH OIL PO Take 1 capsule by mouth daily.   FLUoxetine 40 MG  capsule Commonly known as: PROZAC TAKE ONE CAPSULE BY MOUTH DAILY   fluticasone 50 MCG/ACT nasal spray Commonly known as: FLONASE Place 1 spray into both nostrils daily as needed for allergies or rhinitis.   losartan 50 MG tablet Commonly known as: COZAAR TAKE 1 TABLET BY MOUTH DAILY   MAGNESIUM PO Take 1 tablet by mouth daily.   methocarbamol 500 MG tablet Commonly known as: ROBAXIN Take 1 tablet (500 mg total) by mouth every 8 (eight) hours as needed for up to 5 days for muscle spasms. What changed: when to take this   metoprolol tartrate 25 MG tablet Commonly known as: LOPRESSOR TAKE 1/2 TABLET BY MOUTH TWICE A DAY   ondansetron 4 MG tablet Commonly known as: Zofran Take 1 tablet (4 mg total) by mouth every 8 (eight) hours as needed for nausea or vomiting.   ONE TOUCH ULTRA 2 w/Device Kit Use daily to check blood sugar.   OneTouch Ultra test strip Generic drug: glucose blood USE TO TEST BLOOD SUGAR DAILY AS DIRECTED   onetouch ultrasoft lancets USE TO TEST BLOOD SUGAR DAILY   oxyCODONE-acetaminophen 10-325 MG tablet Commonly known as: Percocet Take 1 tablet by mouth every 6 (six) hours as needed for up to 5 days for pain.   pantoprazole 20 MG tablet Commonly known as: PROTONIX TAKE ONE TABLET BY MOUTH DAILY   pravastatin 80 MG tablet Commonly known as: PRAVACHOL Take 1 tablet (80 mg total) by mouth every evening. Please call [phone] to schedule an appointment for future refills. Thank you.   tadalafil 10 MG tablet Commonly known as: CIALIS Take 1 tablet (10 mg total) by mouth daily.        Diagnostic Studies: DG Lumbar Spine Complete  Result Date: 11/08/2022 CLINICAL DATA:  Elective surgery. Intraop localization. Please read image labeled "film 4" and call results to OR 4. EXAM: LUMBAR SPINE - COMPLETE 4+ VIEW COMPARISON:  None Available. FINDINGS: Four images. On the image labeled "film 4", the surgical probe is directed at the L4-5 level.  IMPRESSION: On the image labeled "film 4", the surgical probe is directed at the L4-5 level. These results were called by telephone at the time of interpretation on 11/08/2022 at 3:18 pm to provider [facility] , who verbally acknowledged these results. Electronically Signed   By: Emmit Alexanders M.D.   On: 11/08/2022 15:18    Discharge Instructions     Incentive spirometry RT   Complete by: As directed  Follow-up Information     Melina Schools, MD. Schedule an appointment as soon as possible for a visit in 2 week(s).   Specialty: Orthopedic Surgery Why: If symptoms worsen, For suture removal, For wound re-check Contact information: [address] [place] [address] [phone]                 Discharge Plan:  discharge to home  Disposition: Patient is doing exceptionally well status post lumbar decompression L4-5 with in situ fusion.  He is ambulating with minimal neuropathic leg pain.  He is able to stand in the neutral position without generating significant neuropathic leg pain.  Dressings are clean dry and intact.  He is voiding spontaneously with positive flatus.  He is tolerating a regular diet.  His pain is currently controlled with oral medications.  Patient will be discharged to home today with appropriate medications.  He will restart his twice daily Eliquis on Thursday and I have advised him on the signs and symptoms of a cauda equina syndrome to go straight to the emergency room.  Patient will follow-up me in 2 weeks as scheduled for his routine follow-up.  Appropriate instructions have been provided to the patient and his wife.    Signed: Dahlia Bailiff for Dr. Melina Schools [facility] [phone] 11/09/2022, 7:58 AM

## 2022-11-10 ENCOUNTER — Telehealth: Payer: Self-pay

## 2022-11-10 NOTE — Patient Outreach (Signed)
  Care Coordination TOC Note Transition Care Management Follow-up Telephone Call Date of discharge and from where: 11/09/22-[facility]  Dx: "spinal stenosis s/p surgery" How have you been since you were released from the hospital? Patient reports he is "doing good so far." He rested well last night. He woke this morning with some pain and soreness but voices pain managed with current pain regimen. He has not taken any muscle relaxer yet. Appetite has been good. Patient reports LBM was prior to discharge. He is aware to monitor for constipation and knows what to do to manage sxs. He voices that surgical dressing still clean,dry and intact.  Any questions or concerns? No  Items Reviewed: Did the pt receive and understand the discharge instructions provided? Yes  Medications obtained and verified? Yes  Other? Yes -post op care, pain mgmt, bowel regimen Any new allergies since your discharge? No  Dietary orders reviewed? Yes Do you have support at home? Yes   Home Care and Equipment/Supplies: Were home health services ordered? not applicable If so, what is the name of the agency? N/A  Has the agency set up a time to come to the patient's home? not applicable Were any new equipment or medical supplies ordered?  No What is the name of the medical supply agency? N/A Were you able to get the supplies/equipment? not applicable Do you have any questions related to the use of the equipment or supplies? No  Functional Questionnaire: (I = Independent and D = Dependent) ADLs: A  Bathing/Dressing- A  Meal Prep- A  Eating- I  Maintaining continence- I  Transferring/Ambulation- I  Managing Meds- I  Follow up appointments reviewed:  PCP Hospital f/u appt confirmed? No   Specialist Hospital f/u appt confirmed? Yes  Scheduled to see Dr. Gasper Sells on 11/25/22 @ 8:40 am. Patient will call surgeon office (Dr. Rolena Infante) today to make an appt.  Are transportation arrangements needed? No  If  their condition worsens, is the pt aware to call PCP or go to the Emergency Dept.? Yes Was the patient provided with contact information for the PCP's office or ED? Yes Was to pt encouraged to call back with questions or concerns? Yes  SDOH assessments and interventions completed:   Yes SDOH Interventions Today    Flowsheet Row Most Recent Value  SDOH Interventions   Food Insecurity Interventions Intervention Not Indicated  Transportation Interventions Intervention Not Indicated       Care Coordination Interventions:  Interventions Today    Flowsheet Row Most Recent Value  Education Interventions   Education Provided Provided Verbal Education  Provided Verbal Education On Nutrition, Medication, When to see the doctor  Nutrition Interventions   Nutrition Discussed/Reviewed Nutrition Discussed  [low salt/heart healthy]       TOC Interventions Today    Flowsheet Row Most Recent Value  TOC Interventions   TOC Interventions Discussed/Reviewed TOC Interventions Discussed, Post op wound/incision care, S/S of infection, Post discharge activity limitations per provider  [pain mgmt, bowel regimen, post op care]        Encounter Outcome:  Pt. Visit Completed    Enzo Montgomery, RN,BSN,CCM [facility] Telephonic Care Management Coordinator Direct Phone: [phone] Toll Free: [phone] Fax: [phone]

## 2022-11-18 ENCOUNTER — Encounter (HOSPITAL_COMMUNITY): Payer: Self-pay | Admitting: *Deleted

## 2022-11-25 ENCOUNTER — Ambulatory Visit: Payer: Medicare HMO | Attending: Internal Medicine | Admitting: Internal Medicine

## 2022-11-25 ENCOUNTER — Encounter: Payer: Self-pay | Admitting: Internal Medicine

## 2022-11-25 VITALS — BP 110/80 | HR 87 | Ht 72.0 in | Wt 237.8 lb

## 2022-11-25 DIAGNOSIS — I152 Hypertension secondary to endocrine disorders: Secondary | ICD-10-CM | POA: Diagnosis not present

## 2022-11-25 DIAGNOSIS — G4733 Obstructive sleep apnea (adult) (pediatric): Secondary | ICD-10-CM

## 2022-11-25 DIAGNOSIS — I480 Paroxysmal atrial fibrillation: Secondary | ICD-10-CM

## 2022-11-25 DIAGNOSIS — E1159 Type 2 diabetes mellitus with other circulatory complications: Secondary | ICD-10-CM

## 2022-11-25 DIAGNOSIS — I77819 Aortic ectasia, unspecified site: Secondary | ICD-10-CM | POA: Diagnosis not present

## 2022-11-25 DIAGNOSIS — I2510 Atherosclerotic heart disease of native coronary artery without angina pectoris: Secondary | ICD-10-CM

## 2022-11-25 NOTE — Patient Instructions (Signed)
Medication Instructions:  Your physician recommends that you continue on your current medications as directed. Please refer to the Current Medication list given to you today.  *If you need a refill on your cardiac medications before your next appointment, please call your pharmacy*   Lab Work: NONE If you have labs (blood work) drawn today and your tests are completely normal, you will receive your results only by: [place] (if you have MyChart) OR A paper copy in the mail If you have any lab test that is abnormal or we need to change your treatment, we will call you to review the results.   Testing/Procedures: Your physician has requested that you have an echocardiogram. Echocardiography is a painless test that uses sound waves to create images of your heart. It provides your doctor with information about the size and shape of your heart and how well your heart's chambers and valves are working. This procedure takes approximately one hour. There are no restrictions for this procedure. Please do NOT wear cologne, perfume, aftershave, or lotions (deodorant is allowed). Please arrive 15 minutes prior to your appointment time.    Follow-Up:Please schedule follow up with Dr. Radford Pax for sleep  At [facility], you and your health needs are our priority.  As part of our continuing mission to provide you with exceptional heart care, we have created designated Provider Care Teams.  These Care Teams include your primary Cardiologist (physician) and Advanced Practice Providers (APPs -  Physician Assistants and Nurse Practitioners) who all work together to provide you with the care you need, when you need it.  We recommend signing up for the patient portal called "MyChart".  Sign up information is provided on this After Visit Summary.  MyChart is used to connect with patients for Virtual Visits (Telemedicine).  Patients are able to view lab/test results, encounter notes, upcoming  appointments, etc.  Non-urgent messages can be sent to your provider as well.   To learn more about what you can do with MyChart, go to [email or web address].    Your next appointment:   6 month(s)  Provider:   Werner Lean, MD  or Nicholes Rough, PA-C, Burna Mortimer, or Ambrose Pancoast, NP

## 2022-11-25 NOTE — Progress Notes (Signed)
Cardiology Office Note:    Date:  11/25/2022   ID:  Brent Taylor, DOB 1940/12/07, MRN [id number]  PCP:  Brent Barrack, MD   [facility] Providers Cardiologist:  Brent Lean, MD Sleep Medicine:  Brent Him, MD     Referring MD: Brent Barrack, MD   CC: Mild aortic dilation  History of Present Illness:    Brent Taylor is a 82 y.o. male with a hx of PAF, CAD win [place] in 2007 with moderate disease NOS, HLD, and incidental mild aortic dilation (4.0 cm) who presents for evaluation.  Seen 11/10/21 after CT aorta showed mild increase in diameter (42 mm). 2023: BP elevations 2024: had surgery.  Had slightly elevated K  Patient notes that he is doing well.   Had back surgery and feels like a new person. Is about to start rehab. There are no interval hospital/ED visit.    No chest pain or pressure .  No SOB/DOE and no PND/Orthopnea.  No weight gain or leg swelling.  No palpitations or syncope.  Atrial fibrillation is noted.    Past Medical History:  Diagnosis Date   Atrial fibrillation ([place])    Cancer ([place])    hx - melanoma   Chicken pox    Colon polyps    Coronary artery disease    Depression    No need for therapy.  Improved   Diabetes (HCC)    Diverticula, colon    Dyspnea    with exertion   GERD (gastroesophageal reflux disease)    Hemorrhoids    Hyperlipidemia    Hypertension    Sepsis ([place])    Sepsis ([place])    Sleep apnea    Tinnitus    UTI (lower urinary tract infection)     Past Surgical History:  Procedure Laterality Date   CARDIAC CATHETERIZATION     LUMBAR LAMINECTOMY/DECOMPRESSION MICRODISCECTOMY N/A 11/08/2022   Procedure: LUMBAR FOUR FIVE DECOMPRESSION AND IN SITU FUSION;  Surgeon: Melina Schools, MD;  Location: [facility];  Service: Orthopedics;  Laterality: N/A;  137mn   MELANOMA EXCISION     LEFT CHEST   MELANOMA EXCISION      Current Medications: Current Meds  Medication Sig   Blood Glucose Monitoring Suppl (ONE TOUCH ULTRA 2) w/Device  KIT Use daily to check blood sugar.   Cyanocobalamin (B-12 PO) Take 1 tablet by mouth daily.   ELIQUIS 5 MG TABS tablet TAKE 1 TABLET BY MOUTH TWICE A DAY   FLUoxetine (PROZAC) 40 MG capsule TAKE ONE CAPSULE BY MOUTH DAILY   fluticasone (FLONASE) 50 MCG/ACT nasal spray Place 1 spray into both nostrils daily as needed for allergies or rhinitis.   Lancets (ONETOUCH ULTRASOFT) lancets USE TO TEST BLOOD SUGAR DAILY   losartan (COZAAR) 50 MG tablet TAKE 1 TABLET BY MOUTH DAILY   MAGNESIUM PO Take 1 tablet by mouth daily.   metoprolol tartrate (LOPRESSOR) 25 MG tablet TAKE 1/2 TABLET BY MOUTH TWICE A DAY   Omega-3 Fatty Acids (FISH OIL PO) Take 1 capsule by mouth daily.   ondansetron (ZOFRAN) 4 MG tablet Take 1 tablet (4 mg total) by mouth every 8 (eight) hours as needed for nausea or vomiting.   ONETOUCH ULTRA test strip USE TO TEST BLOOD SUGAR DAILY AS DIRECTED   pantoprazole (PROTONIX) 20 MG tablet TAKE ONE TABLET BY MOUTH DAILY   pravastatin (PRAVACHOL) 80 MG tablet Take 1 tablet (80 mg total) by mouth every evening. Please call [phone] schedule an appointment  for future refills. Thank you.   tadalafil (CIALIS) 10 MG tablet Take 1 tablet (10 mg total) by mouth daily.   traMADol (ULTRAM) 50 MG tablet as needed for moderate pain or severe pain.   Turmeric (QC TUMERIC COMPLEX) 500 MG CAPS Take by mouth daily at 6 (six) AM.     Allergies:   Lipitor [atorvastatin calcium]   Social History   Socioeconomic History   Marital status: Married    Spouse name: Not on file   Number of children: 2   Years of education: Not on file   Highest education level: Not on file  Occupational History   Occupation: Company secretary   Occupation: retired  Tobacco Use   Smoking status: Never   Smokeless tobacco: Never  Scientific laboratory technician Use: Never used  Substance and Sexual Activity   Alcohol use: No   Drug use: No   Sexual activity: Not on file  Other Topics Concern   Not on file  Social History  Narrative   Lives with wife in [place].   Retired [employer]   Attends Flasher Strain: Low Risk  (12/03/2021)   Overall Financial Resource Strain (CARDIA)    Difficulty of Paying Living Expenses: Not hard at all  Food Insecurity: No Food Insecurity (11/10/2022)   Hunger Vital Sign    Worried About Running Out of Food in the Last Year: Never true    [place] in the Last Year: Never true  Transportation Needs: No Transportation Needs (11/10/2022)   PRAPARE - Hydrologist (Medical): No    Lack of Transportation (Non-Medical): No  Physical Activity: Sufficiently Active (12/03/2021)   Exercise Vital Sign    Days of Exercise per Week: 3 days    Minutes of Exercise per Session: 60 min  Stress: No Stress Concern Present (12/03/2021)   [place]    Feeling of Stress : Not at all  Social Connections: Moderately Integrated (12/03/2021)   Social Connection and Isolation Panel [NHANES]    Frequency of Communication with Friends and Family: Three times a week    Frequency of Social Gatherings with Friends and Family: More than three times a week    Attends Religious Services: More than 4 times per year    Active Member of [employer] or Organizations: No    Attends Archivist Meetings: Never    Marital Status: Married     Family History: The patient's family history includes Heart Problems in his mother. History of coronary artery disease notable for father with CABG. History of heart failure notable for father. History of arrhythmia notable for mother with palpitations. Denies family history of sudden cardiac death including drowning, car accidents, or unexplained deaths in the family. No history of bicuspid aortic valve or aortic aneurysm or dissection.    ROS:   Please see the history of present illness.      All other systems reviewed and are negative.  EKGs/Labs/Other Studies Reviewed:    The following studies were reviewed today:  EKG:  11/25/22: A fib 87 rare PVCs 11/09/2021: SR rate 81 1st HB with PACs and LVH 02/11/21: SR Rate 64 PACs LVH  Cardiac Studies & Procedures   CARDIAC CATHETERIZATION  CARDIAC CATHETERIZATION 10/28/2015     ECHOCARDIOGRAM  ECHOCARDIOGRAM COMPLETE 10/16/2021  Narrative ECHOCARDIOGRAM REPORT    Patient  Name:   Brent Taylor   Date of Exam: 10/16/2021 Medical Rec #:  [id number]     Height:       72.0 in Accession #:    [id number]    Weight:       250.0 lb Date of Birth:  02/02/1941      BSA:          2.343 m Patient Age:    49 years      BP:           140/86 mmHg Patient Gender: M             HR:           59 bpm. Exam Location:  [facility]  Procedure: 2D Echo, Cardiac Doppler and Color Doppler  Indications:    I71.20 Thoracic aortic aneurysm  History:        Patient has prior history of Echocardiogram examinations, most recent 10/15/2020. CAD, Arrythmias:Atrial Fibrillation; Risk Factors:Hypertension, Diabetes and Dyslipidemia. Low back pain. Obstructive sleep apnea.  Sonographer:    Diamond Nickel RCS Referring Phys: [id number] [facility] A Oz Gammel  IMPRESSIONS   1. Left ventricular ejection fraction, by estimation, is 50 to 55%. The left ventricle has low normal function. The left ventricle has no regional wall motion abnormalities. There is mild-to-moderate concentric left ventricular hypertrophy. Left ventricular diastolic parameters are consistent with Grade II diastolic dysfunction (pseudonormalization). 2. Right ventricular systolic function is normal. The right ventricular size is normal. 3. Left atrial size was moderately dilated. 4. The mitral valve is grossly normal. Mild mitral valve regurgitation. 5. The aortic valve is tricuspid. There is mild calcification of the aortic valve. There is mild thickening of the aortic valve. Aortic  valve regurgitation is not visualized. Aortic valve sclerosis/calcification is present, without any evidence of aortic stenosis. 6. Aortic dilatation noted. There is moderate dilatation of the ascending aorta, measuring 44 mm.  Comparison(s): Compared to prior echo report in 10/2020, the ascending aorta remains dilated. Currently measures 79m and was previously reported at 457m however, the aorta is better visualized on current study and this does not necessarily represent interval dilation since prior study. Would recommend CTA for further evaluation.  FINDINGS Left Ventricle: Left ventricular ejection fraction, by estimation, is 50 to 55%. The left ventricle has low normal function. The left ventricle has no regional wall motion abnormalities. The left ventricular internal cavity size was normal in size. There is mild-to-moderate concentric left ventricular hypertrophy. Left ventricular diastolic parameters are consistent with Grade II diastolic dysfunction (pseudonormalization).  Right Ventricle: The right ventricular size is normal. No increase in right ventricular wall thickness. Right ventricular systolic function is normal.  Left Atrium: Left atrial size was moderately dilated.  Right Atrium: Right atrial size was normal in size.  Pericardium: There is no evidence of pericardial effusion.  Mitral Valve: The mitral valve is grossly normal. There is mild thickening of the mitral valve leaflet(s). Mild mitral annular calcification. Mild mitral valve regurgitation.  Tricuspid Valve: The tricuspid valve is normal in structure. Tricuspid valve regurgitation is trivial.  Aortic Valve: The aortic valve is tricuspid. There is mild calcification of the aortic valve. There is mild thickening of the aortic valve. Aortic valve regurgitation is not visualized. Aortic valve sclerosis/calcification is present, without any evidence of aortic stenosis.  Pulmonic Valve: The pulmonic valve was normal  in structure. Pulmonic valve regurgitation is not visualized.  Aorta: Aortic dilatation noted. There is moderate dilatation of the ascending aorta, measuring 44  mm.  Venous: The inferior vena cava was not well visualized.  IAS/Shunts: The atrial septum is grossly normal.   LEFT VENTRICLE PLAX 2D LVIDd:         4.20 cm   Diastology LVIDs:         3.20 cm   LV e' medial:    7.33 cm/s LV PW:         1.40 cm   LV E/e' medial:  17.2 LV IVS:        1.30 cm   LV e' lateral:   10.41 cm/s LVOT diam:     2.25 cm   LV E/e' lateral: 12.1 LV SV:         68 LV SV Index:   29 LVOT Area:     3.98 cm   RIGHT VENTRICLE RV Basal diam:  3.60 cm RV S prime:     15.73 cm/s TAPSE (M-mode): 2.0 cm RVSP:           24.7 mmHg  LEFT ATRIUM              Index        RIGHT ATRIUM           Index LA diam:        3.40 cm  1.45 cm/m   RA Pressure: 3.00 mmHg LA Vol (A2C):   75.0 ml  32.02 ml/m  RA Area:     21.80 cm LA Vol (A4C):   114.0 ml 48.67 ml/m  RA Volume:   71.60 ml  30.57 ml/m LA Biplane Vol: 93.6 ml  39.96 ml/m AORTIC VALVE LVOT Vmax:   92.80 cm/s LVOT Vmean:  66.700 cm/s LVOT VTI:    0.170 m  AORTA Ao Root diam: 3.40 cm Ao Asc diam:  4.00 cm  MITRAL VALVE                TRICUSPID VALVE MV Area (PHT): 4.23 cm     TR Peak grad:   21.7 mmHg MV Decel Time: 179 msec     TR Vmax:        233.00 cm/s MR Peak grad: 68.1 mmHg     Estimated RAP:  3.00 mmHg MR Mean grad: 43.3 mmHg     RVSP:           24.7 mmHg MR Vmax:      412.67 cm/s MR Vmean:     305.7 cm/s    SHUNTS MV E velocity: 126.33 cm/s  Systemic VTI:  0.17 m MV A velocity: 98.23 cm/s   Systemic Diam: 2.25 cm MV E/A ratio:  1.29  Gwyndolyn Kaufman MD Electronically signed by Gwyndolyn Kaufman MD Signature Date/Time: 10/16/2021/1:50:03 PM    Final    MONITORS  LONG TERM MONITOR (3-14 DAYS) 10/22/2021  Narrative Patch Wear Time:  13 days and 21 hours  Predominant underlying rhythm was sinus rhythm Multiple runs of SVT,  longest and fastest 1 minute 33 seconds at an average of 161 bpm 24.8% supraventricular ectopy Less than 1% ventricular ectopy Symptoms of weakness associated with sinus rhythm and atrial ectopy  Will Curt Bears, MD            NM Stress Testing : Date: 07/25/2012 Results: No issues- cannot pull exam  CT Aorta: Date:11/09/21 Results:  Greatest estimated diameter of the ascending aorta 4.2 cm. Recommend annual imaging followup by CTA or MRA. This recommendation follows 2010 ACCF/AHA/AATS/ACR/ASA/SCA/SCAI/SIR/STS/SVM Guidelines for the Diagnosis and Management of Patients with Thoracic Aortic Disease.  Circulation. 2010; [id number]. Aortic aneurysm NOS (ICD10-I71.9)   Single nodule of 6 mm within the right middle lobe. Non-contrast chest CT at 6-12 months is recommended. If the nodule is stable at time of repeat CT, then future CT at 18-24 months (from today's scan) is considered optional for low-risk patients, but is recommended for high-risk patients. This recommendation follows the consensus statement: Guidelines for Management of Incidental Pulmonary Nodules Detected on CT Images: From the Fleischner Society 2017; Radiology 2017; 284:228-243.   Left/Right Heart Catheterizations: Date: 09/21/2006 Results: [place] and unable to pull up films; patient suggests moderate disease by discription   Recent Labs: 10/26/2022: ALT 23; Hemoglobin 14.1; Platelets 239.0; TSH 2.43 10/28/2022: BUN 19; Creatinine, Ser 1.17; Potassium 4.8; Sodium 140  Recent Lipid Panel    Component Value Date/Time   CHOL 134 10/26/2022 0838   CHOL 123 01/25/2022 0910   TRIG 127.0 10/26/2022 0838   HDL 38.30 (L) 10/26/2022 0838   HDL 33 (L) 01/25/2022 0910   CHOLHDL 4 10/26/2022 0838   VLDL 25.4 10/26/2022 0838   LDLCALC 70 10/26/2022 0838   LDLCALC 68 01/25/2022 0910   LDLDIRECT 142.7 12/10/2011 1436    Risk Assessment/Calculations:    CHA2DS2-VASc Score = 5  This indicates a 7.2% annual risk  of stroke. The patient's score is based upon: CHF History: 0 HTN History: 1 Diabetes History: 1 Stroke History: 0 Vascular Disease History: 1 Age Score: 2 Gender Score: 0       Physical Exam:    VS:  BP 110/80   Pulse 87   Ht 6' (1.829 m)   Wt 237 lb 12.8 oz (107.9 kg)   SpO2 95%   BMI 32.25 kg/m     Wt Readings from Last 3 Encounters:  11/25/22 237 lb 12.8 oz (107.9 kg)  11/08/22 241 lb (109.3 kg)  11/03/22 241 lb (109.3 kg)    Gen: No distress   Neck: No JVD Ears: Bilateral [name] Sign Cardiac: No Rubs or Gallops, no murmur, IRIRI +2 radial pulses Respiratory: Clear to auscultation bilaterally, normal effort, normal  respiratory rate GI: Soft, nontender, non-distended MS: No edema;  moves all extremities Integument: Skin feels warm Neuro:  At time of evaluation, alert and oriented to person/place/time/situation  Psych: Normal affect, patient feels well  ASSESSMENT:    1. Aortic dilatation (HCC)   2. OSA (obstructive sleep apnea)   3. Paroxysmal atrial fibrillation (HCC)   4. Hypertension associated with diabetes ([place])   5. Coronary artery disease involving native coronary artery of native heart without angina pectoris     PLAN:    Mild aortic dilation - will get echo - if EF drops; send to [facility] for eval for ablation - if Aorta above 45 mm will get CT Aorta  CAD mod non obstructive LHC in [place] HLD and Aortic atherosclerosis  Statin myalgias with atorvastatin - pravastatin 80 mg- LDL is now at goal with no symptoms  OSA not on therapy HTN - K issues with ACEi, on losartan 50 mg BP controlled at K WNL Needs recall with sleep medicine (non urgent)  PAF- CHADSVASC 6 per EP (AGE x2, HTN, CAD; ? Of CHF with 2017 mrEF) on Eliquis - Has sen EP and AF clinic - continue eliquis and metoprolol has PRN diltiazem    Six months me or APP   Medication Adjustments/Labs and Tests Ordered: Current medicines are reviewed at length with the patient today.   Concerns regarding medicines are outlined above.  Orders  Placed This Encounter  Procedures   EKG 12-Lead   ECHOCARDIOGRAM COMPLETE   No orders of the defined types were placed in this encounter.   Patient Instructions  Medication Instructions:  Your physician recommends that you continue on your current medications as directed. Please refer to the Current Medication list given to you today.  *If you need a refill on your cardiac medications before your next appointment, please call your pharmacy*   Lab Work: NONE If you have labs (blood work) drawn today and your tests are completely normal, you will receive your results only by: [place] (if you have MyChart) OR A paper copy in the mail If you have any lab test that is abnormal or we need to change your treatment, we will call you to review the results.   Testing/Procedures: Your physician has requested that you have an echocardiogram. Echocardiography is a painless test that uses sound waves to create images of your heart. It provides your doctor with information about the size and shape of your heart and how well your heart's chambers and valves are working. This procedure takes approximately one hour. There are no restrictions for this procedure. Please do NOT wear cologne, perfume, aftershave, or lotions (deodorant is allowed). Please arrive 15 minutes prior to your appointment time.    Follow-Up:Please schedule follow up with Dr. Radford Pax for sleep  At [facility], you and your health needs are our priority.  As part of our continuing mission to provide you with exceptional heart care, we have created designated Provider Care Teams.  These Care Teams include your primary Cardiologist (physician) and Advanced Practice Providers (APPs -  Physician Assistants and Nurse Practitioners) who all work together to provide you with the care you need, when you need it.  We recommend signing up for the patient portal called  "MyChart".  Sign up information is provided on this After Visit Summary.  MyChart is used to connect with patients for Virtual Visits (Telemedicine).  Patients are able to view lab/test results, encounter notes, upcoming appointments, etc.  Non-urgent messages can be sent to your provider as well.   To learn more about what you can do with MyChart, go to [email or web address].    Your next appointment:   6 month(s)  Provider:   Werner Lean, MD  or Nicholes Rough, PA-C, Burna Mortimer, or Ambrose Pancoast, NP          Signed, Brent Lean, MD  11/25/2022 9:18 AM    [place]

## 2022-12-02 ENCOUNTER — Encounter: Payer: Self-pay | Admitting: Internal Medicine

## 2022-12-02 DIAGNOSIS — G4733 Obstructive sleep apnea (adult) (pediatric): Secondary | ICD-10-CM | POA: Diagnosis not present

## 2022-12-13 ENCOUNTER — Encounter: Payer: Self-pay | Admitting: Internal Medicine

## 2022-12-13 ENCOUNTER — Encounter: Payer: Self-pay | Admitting: Neurology

## 2022-12-13 ENCOUNTER — Other Ambulatory Visit: Payer: Self-pay | Admitting: Internal Medicine

## 2022-12-13 DIAGNOSIS — I480 Paroxysmal atrial fibrillation: Secondary | ICD-10-CM

## 2022-12-13 DIAGNOSIS — I2510 Atherosclerotic heart disease of native coronary artery without angina pectoris: Secondary | ICD-10-CM

## 2022-12-15 ENCOUNTER — Other Ambulatory Visit: Payer: Self-pay

## 2022-12-15 DIAGNOSIS — I2510 Atherosclerotic heart disease of native coronary artery without angina pectoris: Secondary | ICD-10-CM

## 2022-12-15 DIAGNOSIS — I480 Paroxysmal atrial fibrillation: Secondary | ICD-10-CM

## 2022-12-15 MED ORDER — PRAVASTATIN SODIUM 80 MG PO TABS: 80.0000 mg | ORAL_TABLET | Freq: Every evening | ORAL | 3 refills | Status: DC

## 2022-12-16 DIAGNOSIS — N529 Male erectile dysfunction, unspecified: Secondary | ICD-10-CM | POA: Diagnosis not present

## 2022-12-16 DIAGNOSIS — K219 Gastro-esophageal reflux disease without esophagitis: Secondary | ICD-10-CM | POA: Diagnosis not present

## 2022-12-16 DIAGNOSIS — I2510 Atherosclerotic heart disease of native coronary artery without angina pectoris: Secondary | ICD-10-CM | POA: Diagnosis not present

## 2022-12-16 DIAGNOSIS — E1142 Type 2 diabetes mellitus with diabetic polyneuropathy: Secondary | ICD-10-CM | POA: Diagnosis not present

## 2022-12-16 DIAGNOSIS — J302 Other seasonal allergic rhinitis: Secondary | ICD-10-CM | POA: Diagnosis not present

## 2022-12-16 DIAGNOSIS — R269 Unspecified abnormalities of gait and mobility: Secondary | ICD-10-CM | POA: Diagnosis not present

## 2022-12-16 DIAGNOSIS — M4800 Spinal stenosis, site unspecified: Secondary | ICD-10-CM | POA: Diagnosis not present

## 2022-12-16 DIAGNOSIS — R69 Illness, unspecified: Secondary | ICD-10-CM | POA: Diagnosis not present

## 2022-12-16 DIAGNOSIS — E785 Hyperlipidemia, unspecified: Secondary | ICD-10-CM | POA: Diagnosis not present

## 2022-12-16 DIAGNOSIS — Z008 Encounter for other general examination: Secondary | ICD-10-CM | POA: Diagnosis not present

## 2022-12-16 DIAGNOSIS — I4891 Unspecified atrial fibrillation: Secondary | ICD-10-CM | POA: Diagnosis not present

## 2022-12-16 DIAGNOSIS — G4733 Obstructive sleep apnea (adult) (pediatric): Secondary | ICD-10-CM | POA: Diagnosis not present

## 2022-12-20 DIAGNOSIS — M5416 Radiculopathy, lumbar region: Secondary | ICD-10-CM | POA: Diagnosis not present

## 2022-12-22 ENCOUNTER — Ambulatory Visit: Payer: Medicare HMO | Admitting: Neurology

## 2022-12-22 VITALS — BP 148/90 | HR 88 | Ht 72.0 in | Wt 237.0 lb

## 2022-12-22 DIAGNOSIS — M5030 Other cervical disc degeneration, unspecified cervical region: Secondary | ICD-10-CM | POA: Diagnosis not present

## 2022-12-22 DIAGNOSIS — G629 Polyneuropathy, unspecified: Secondary | ICD-10-CM | POA: Insufficient documentation

## 2022-12-22 DIAGNOSIS — M5442 Lumbago with sciatica, left side: Secondary | ICD-10-CM

## 2022-12-22 DIAGNOSIS — G6289 Other specified polyneuropathies: Secondary | ICD-10-CM | POA: Diagnosis not present

## 2022-12-22 DIAGNOSIS — R269 Unspecified abnormalities of gait and mobility: Secondary | ICD-10-CM

## 2022-12-22 DIAGNOSIS — M5441 Lumbago with sciatica, right side: Secondary | ICD-10-CM

## 2022-12-22 NOTE — Progress Notes (Signed)
Chief Complaint  Patient presents with   Procedure    Rm EMG/NCV 4.   ASSESSMENT AND PLAN  Brent Taylor is a 82 y.o. male   Peripheral neuropathy  EMG nerve conduction study December 22, 2022 confirmed severe axonal sensorimotor polyneuropathy, severe right carpal tunnel syndrome, chronic bilateral lumbosacral radiculopathy,  Complete laboratory evaluation for treatable causes of peripheral neuropathy, can be related to his diabetes, A1c 7.1, have not been treated,   known history of cervical degenerative disease Lumbar degenerative disease, status post L4-5 decompression by Dr. Rolena Infante in December 2023, which has helped his low back pain,   Will continue physical therapy, let him know the lab result, if there is no significant abnormalities, encouraged him to continue moderate exercise, only return to neurology for new issues   DIAGNOSTIC DATA (LABS, IMAGING, TESTING) - I reviewed patient records, labs, notes, testing and imaging myself where available.   MEDICAL HISTORY:  Brent Taylor, is a 82 year old male seen in request by his primary care physician Dr. [name], [facility], for evaluation of low back pain, radiating pain to bilateral lower extremity, worsening gait abnormality, initial evaluation September 16, 2022  I reviewed and summarized the referring note.PMHx. Afib Depression HTN HLD Left chest melanoma  He has a long history of chronic low back pain, gradually getting worse, increased radiating pain to bilateral hip, posterior leg, all the way to calf, and foot,  He has worsening balance issues since 2022, also with worsening urinary urgency, frequency, he has chronic neck pain, denies radiating pain to upper extremity and fingers  Personally reviewed CT maxillary face January 09, 2022, no acute abnormality mild as moderate and maxillary sinus disease  UPDATE December 22 2022: He had lumbar decompression L4-5 with in situ fusion by Dr. Rolena Infante on November 08, 2022, which has  helped his symptoms, his low back pain has much improved, walking has improved too, but still has balance issues. He has both feet numbness, can be painful sometimes, improved after surgery.  Also followed by cardiologist Dr. Gasper Sells for aortic dilatation, paroxysmal atrial fibrillation, hypertension, diabetes, coronary artery disease,  Today his main complaint is both feet dense numbness, no significant pain, also gait abnormality, which has gradually worsened over many years  Laboratory evaluations showed normal TSH, low normal B12 275, A1c 7.1, CBC hemoglobin 14.1, CMP creatinine of 1.22, lipid panel, LDL 58  PHYSICAL EXAM:   Vitals:   12/22/22 0817  BP: (!) 148/90  Pulse: 88  Weight: 237 lb (107.5 kg)  Height: 6' (1.829 m)   Not recorded     Body mass index is 32.14 kg/m.  PHYSICAL EXAMNIATION:  Gen: NAD, conversant, well nourised, well groomed                     Cardiovascular: Regular rate rhythm, no peripheral edema, warm, nontender. Eyes: Conjunctivae clear without exudates or hemorrhage Neck: Supple, no carotid bruits. Pulmonary: Clear to auscultation bilaterally   NEUROLOGICAL EXAM:  MENTAL STATUS: Speech/cognition: Awake, alert, oriented to history taking and casual conversation CRANIAL NERVES: CN II: Visual fields are full to confrontation. Pupils are round equal and briskly reactive to light. CN III, IV, VI: extraocular movement are normal. No ptosis. CN V: Facial sensation is intact to light touch CN VII: Face is symmetric with normal eye closure  CN VIII: Hearing is normal to causal conversation. CN IX, X: Phonation is normal. CN XI: Head turning and shoulder shrug are intact CN XII: Narrowing pharyngeal space,  drapery of uvular and soft palate  MOTOR: Bilateral hammertoes, no significant muscle weakness  REFLEXES: Reflexes are 1 and symmetric at the biceps, triceps, knees, and absent at ankles. Plantar responses are flexor.  SENSORY: Left  dependent decreased light touch, pinprick to below knee level, absent vibratory sensation at lower extremity  COORDINATION: There is no trunk or limb dysmetria noted.  GAIT/STANCE: Needed patient up to get up from seated position, wide-based, stiff,  , positive Romberg signs, could not stand up on tiptoes or heels,  REVIEW OF SYSTEMS:  Full 14 system review of systems performed and notable only for as above All other review of systems were negative.   ALLERGIES: Allergies  Allergen Reactions   Lipitor [Atorvastatin Calcium]     Muscle ache    HOME MEDICATIONS: Current Outpatient Medications  Medication Sig Dispense Refill   Blood Glucose Monitoring Suppl (ONE TOUCH ULTRA 2) w/Device KIT Use daily to check blood sugar. 1 kit 0   Cyanocobalamin (B-12 PO) Take 1 tablet by mouth daily.     ELIQUIS 5 MG TABS tablet TAKE 1 TABLET BY MOUTH TWICE A DAY 60 tablet 5   FLUoxetine (PROZAC) 40 MG capsule TAKE ONE CAPSULE BY MOUTH DAILY 90 capsule 3   fluticasone (FLONASE) 50 MCG/ACT nasal spray Place 1 spray into both nostrils daily as needed for allergies or rhinitis.     Lancets (ONETOUCH ULTRASOFT) lancets USE TO TEST BLOOD SUGAR DAILY 100 each 12   losartan (COZAAR) 50 MG tablet TAKE 1 TABLET BY MOUTH DAILY 90 tablet 0   MAGNESIUM PO Take 1 tablet by mouth daily.     metoprolol tartrate (LOPRESSOR) 25 MG tablet TAKE 1/2 TABLET BY MOUTH TWICE A DAY 90 tablet 3   Omega-3 Fatty Acids (FISH OIL PO) Take 1 capsule by mouth daily.     ondansetron (ZOFRAN) 4 MG tablet Take 1 tablet (4 mg total) by mouth every 8 (eight) hours as needed for nausea or vomiting. 20 tablet 0   ONETOUCH ULTRA test strip USE TO TEST BLOOD SUGAR DAILY AS DIRECTED 100 strip 3   pantoprazole (PROTONIX) 20 MG tablet TAKE ONE TABLET BY MOUTH DAILY 90 tablet 3   pravastatin (PRAVACHOL) 80 MG tablet Take 1 tablet (80 mg total) by mouth every evening. 90 tablet 3   tadalafil (CIALIS) 10 MG tablet Take 1 tablet (10 mg total) by  mouth daily. 90 tablet 3   traMADol (ULTRAM) 50 MG tablet as needed for moderate pain or severe pain.     Turmeric (QC TUMERIC COMPLEX) 500 MG CAPS Take by mouth daily at 6 (six) AM.     No current facility-administered medications for this visit.    PAST MEDICAL HISTORY: Past Medical History:  Diagnosis Date   Atrial fibrillation ([place])    Cancer ([place])    hx - melanoma   Chicken pox    Colon polyps    Coronary artery disease    Depression    No need for therapy.  Improved   Diabetes ([place])    Diverticula, colon    Dyspnea    with exertion   GERD (gastroesophageal reflux disease)    Hemorrhoids    Hyperlipidemia    Hypertension    Sepsis ([place])    Sepsis ([place])    Sleep apnea    Tinnitus    UTI (lower urinary tract infection)     PAST SURGICAL HISTORY: Past Surgical History:  Procedure Laterality Date   CARDIAC CATHETERIZATION  LUMBAR LAMINECTOMY/DECOMPRESSION MICRODISCECTOMY N/A 11/08/2022   Procedure: LUMBAR FOUR FIVE DECOMPRESSION AND IN SITU FUSION;  Surgeon: Melina Schools, MD;  Location: [facility];  Service: Orthopedics;  Laterality: N/A;  131mn   MELANOMA EXCISION     LEFT CHEST   MELANOMA EXCISION      FAMILY HISTORY: Family History  Problem Relation Age of Onset   Heart Problems Mother        Skipping    SOCIAL HISTORY: Social History   Socioeconomic History   Marital status: Married    Spouse name: Not on file   Number of children: 2   Years of education: Not on file   Highest education level: Not on file  Occupational History   Occupation: MCompany secretary  Occupation: retired  Tobacco Use   Smoking status: Never   Smokeless tobacco: Never  VScientific laboratory technicianUse: Never used  Substance and Sexual Activity   Alcohol use: No   Drug use: No   Sexual activity: Not on file  Other Topics Concern   Not on file  Social History Narrative   [place]   Retired [employer]  Attends LWest ConcordStrain: Low Risk  (12/03/2021)   Overall Financial Resource Strain (CARDIA)    Difficulty of Paying Living Expenses: Not hard at all  Food Insecurity: No Food Insecurity (11/10/2022)   Hunger Vital Sign    Worried About Running Out of Food in the Last Year: Never true    RPlainsboro Centerin the Last Year: Never true  Transportation Needs: No Transportation Needs (11/10/2022)   PRAPARE - THydrologist(Medical): No    Lack of Transportation (Non-Medical): No  Physical Activity: Sufficiently Active (12/03/2021)   Exercise Vital Sign    Days of Exercise per Week: 3 days    Minutes of Exercise per Session: 60 min  Stress: No Stress Concern Present (12/03/2021)   [place]   Feeling of Stress : Not at all  Social Connections: Moderately Integrated (12/03/2021)   Social Connection and Isolation Panel [NHANES]    Frequency of Communication with Friends and Family: Three times a week    Frequency of Social Gatherings with Friends and Family: More than three times a week    Attends Religious Services: More than 4 times per year    Active Member of CGenuine Partsor Organizations: No    Attends CArchivistMeetings: Never    Marital Status: Married  IHuman resources officerViolence: Not At Risk (12/03/2021)   Humiliation, Afraid, Rape, and Kick questionnaire    Fear of Current or Ex-Partner: No    Emotionally Abused: No    Physically Abused: No    Sexually Abused: No      YMarcial Pacas M.D. Ph.D.  [facility] [address] [place] [id number]: [phone]: [phone] CC:  YMarcial Pacas MD [id number]  [place] [id number] PVivi Barrack MD

## 2022-12-22 NOTE — Procedures (Signed)
Full Name: Brent Taylor Gender: Male MRN #: [id number] Date of Birth: January 17, 1941    Visit Date: 12/22/2022 08:11 Age: 82 Years Examining Physician: Dr. Marcial Taylor Referring Physician: Dr. Marcial Taylor Height: 6 feet 0 inch History: 82 year old male with persistent bilateral lower extremity numbness, despite recent lumbar decompression surgery,  Summary of the test:  Nerve conduction study:  Bilateral sural, superficial peroneal sensory responses were absent.  Right median and ulnar sensory response was absent.  Right radial sensory response showed moderately decreased snap amplitude.  Bilateral tibial right peroneal motor responses showed significantly decreased CMAP amplitude.  Left peroneal to EDB motor response was absent.  Right ulnar motor responses were within normal limit.  Right median motor response showed moderately prolonged distal latency, with significantly decreased CMAP amplitude.  Electromyography: Selected needle examinations were performed at bilateral lower extremity muscles, right upper extremity muscles, bilateral lumbar sacral and right cervical paraspinal muscles.  There is evidence of chronic neuropathic changes involving all the extremity muscles tested, most obvious at left tibialis anterior, peroneal longus, medial gastrocnemius, tibialis posterior, with evidence of active denervation  There was increased insertional activity, polyphasic motor unit potential at bilateral lower lumbosacral paraspinal muscles.  Selective needle examination of right upper extremity and right cervical paraspinal muscles showed no significant abnormalities.   Conclusion: This is an abnormal study.  There is electrodiagnostic evidence of severe axonal sensorimotor polyneuropathy, severe right carpal tunnel syndromes.  There is no evidence of right cervical radiculopathy.  In addition, there is evidence of chronic bilateral lumbosacral radiculopathy involving bilateral L4-5 S1  myotomes.   ------------------------------- Brent Taylor, M.D. Ph.D.  [facility] [address] [place], [place] [address] Tel: [phone] Fax: [phone]  Verbal informed consent was obtained from the patient, patient was informed of potential risk of procedure, including bruising, bleeding, hematoma formation, infection, muscle weakness, muscle pain, numbness, among others.        Brent Taylor    Nerve / Sites Muscle Latency Ref. Amplitude Ref. Rel Amp Segments Distance Velocity Ref. Area    ms ms mV mV %  cm m/s m/s mVms  R Median - APB     Wrist APB 6.0 ?4.4 0.6 ?4.0 100 Wrist - APB 7   1.4     Upper arm APB 11.5  0.7  120 Upper arm - Wrist 27 49 ?49 2.2  R Ulnar - ADM     Wrist ADM 3.2 ?3.3 6.2 ?6.0 100 Wrist - ADM 7   19.5     B.Elbow ADM 5.6  5.7  91.7 B.Elbow - Wrist 13.5 55 ?49 18.2     A.Elbow ADM 9.2  5.1  90.1 A.Elbow - B.Elbow 18 50 ?49 17.4  R Peroneal - EDB     Ankle EDB 6.4 ?6.5 0.8 ?2.0 100 Ankle - EDB 9   3.4     Fib head EDB 19.3  0.3  31.4 Fib head - Ankle 26 20 ?44 1.3     Pop fossa EDB 17.0  0.6  228 Pop fossa - Fib head 12 54 ?44 4.2         Pop fossa - Ankle      L Peroneal - EDB     Ankle EDB NR ?6.5 NR ?2.0 NR Ankle - EDB 9   NR         Pop fossa - Ankle      R Tibial - AH     Ankle  AH 6.3 ?5.8 0.3 ?4.0 100 Ankle - AH 9   0.8     Pop fossa AH 18.8  0.4  117 Pop fossa - Ankle 43 34 ?41 1.3  L Tibial - AH     Ankle AH 7.6 ?5.8 0.3 ?4.0 100 Ankle - AH 9   0.5     Pop fossa AH 25.8  0.2  86 Pop fossa - Ankle 44 24 ?41 0.6                 SNC    Nerve / Sites Rec. Site Peak Lat Ref.  Amp Ref. Segments Distance    ms ms V V  cm  R Radial - Anatomical snuff box (Forearm)     Forearm Wrist 2.6 ?2.9 8 ?15 Forearm - Wrist 10  R Sural - Ankle (Calf)     Calf Ankle NR ?4.4 NR ?6 Calf - Ankle 14  L Sural - Ankle (Calf)     Calf Ankle 3.6 ?4.4 3 ?6 Calf - Ankle 14  R Superficial peroneal - Ankle     Lat leg Ankle NR ?4.4 NR ?6 Lat  leg - Ankle 14  L Superficial peroneal - Ankle     Lat leg Ankle NR ?4.4 NR ?6 Lat leg - Ankle 14  R Median - Orthodromic (Dig II, Mid palm)     Dig II Wrist NR ?3.4 NR ?10 Dig II - Wrist 13  R Ulnar - Orthodromic, (Dig V, Mid palm)     Dig V Wrist NR ?3.1 NR ?5 Dig V - Wrist 55                   F  Wave    Nerve F Lat Ref.   ms ms  R Ulnar - ADM 32.0 ?32.0         EMG Summary Table    Spontaneous MUAP Recruitment  Muscle IA Fib PSW Fasc Other Amp Dur. Poly Pattern  L. Tibialis anterior Increased 1+ None None _______ Increased Increased 1+ Reduced  L. Tibialis posterior Increased 1+ None None _______ Increased Increased 1+ Reduced  L. Peroneus longus Increased 1+ None None _______ Increased Increased 1+ Reduced  L. Vastus lateralis Normal None None None _______ Increased Increased 1+ Reduced  R. Tibialis anterior Increased None None None _______ Increased Increased 1+ Reduced  R. Tibialis posterior Increased None None None _______ Increased Increased 1+ Reduced  R. Peroneus longus Normal None None None _______ Increased Increased 1+ Reduced  R. Gastrocnemius (Medial head) Normal None None None _______ Increased Increased 1+ Reduced  R. Vastus lateralis Increased None None None _______ Increased Increased 1+ Reduced  L. Gastrocnemius (Medial head) Increased 1+ None None _______ Increased Increased 1+ Reduced  R. Lumbar paraspinals (low) Increased None None None _______ Normal Normal Normal Normal  R. Lumbar paraspinals (mid) Increased None None None _______ Normal Normal Normal Normal  L. Lumbar paraspinals (low) Increased None None None _______ Normal Normal 1+ Normal  L. Lumbar paraspinals (mid) Increased None None None _______ Normal Normal 1+ Normal  R. First dorsal interosseous Normal None None None _______ Normal Normal Normal Normal  R. Pronator teres Normal None None None _______ Normal Normal Normal Normal  R. Biceps brachii Normal None None None _______ Normal Normal Normal  Normal  R. Deltoid Normal None None None _______ Normal Normal Normal Normal  R. Extensor digitorum communis Normal None None None _______ Normal Normal Normal Normal  R. Cervical  paraspinals Normal None None None _______ Normal Normal Normal Normal

## 2022-12-23 ENCOUNTER — Ambulatory Visit (INDEPENDENT_AMBULATORY_CARE_PROVIDER_SITE_OTHER): Payer: Medicare HMO

## 2022-12-23 ENCOUNTER — Ambulatory Visit (HOSPITAL_COMMUNITY): Payer: Medicare HMO | Attending: Cardiovascular Disease

## 2022-12-23 ENCOUNTER — Encounter: Payer: Self-pay | Admitting: Family Medicine

## 2022-12-23 DIAGNOSIS — I77810 Thoracic aortic ectasia: Secondary | ICD-10-CM | POA: Insufficient documentation

## 2022-12-23 DIAGNOSIS — E119 Type 2 diabetes mellitus without complications: Secondary | ICD-10-CM | POA: Diagnosis not present

## 2022-12-23 DIAGNOSIS — I340 Nonrheumatic mitral (valve) insufficiency: Secondary | ICD-10-CM | POA: Insufficient documentation

## 2022-12-23 DIAGNOSIS — G4730 Sleep apnea, unspecified: Secondary | ICD-10-CM | POA: Insufficient documentation

## 2022-12-23 DIAGNOSIS — Z0000 Encounter for general adult medical examination without abnormal findings: Secondary | ICD-10-CM | POA: Diagnosis not present

## 2022-12-23 DIAGNOSIS — I77819 Aortic ectasia, unspecified site: Secondary | ICD-10-CM

## 2022-12-23 DIAGNOSIS — R0600 Dyspnea, unspecified: Secondary | ICD-10-CM | POA: Diagnosis not present

## 2022-12-23 DIAGNOSIS — I4891 Unspecified atrial fibrillation: Secondary | ICD-10-CM | POA: Diagnosis not present

## 2022-12-23 DIAGNOSIS — Z87891 Personal history of nicotine dependence: Secondary | ICD-10-CM | POA: Diagnosis not present

## 2022-12-23 DIAGNOSIS — Z8249 Family history of ischemic heart disease and other diseases of the circulatory system: Secondary | ICD-10-CM | POA: Insufficient documentation

## 2022-12-23 DIAGNOSIS — I2510 Atherosclerotic heart disease of native coronary artery without angina pectoris: Secondary | ICD-10-CM | POA: Insufficient documentation

## 2022-12-23 DIAGNOSIS — I119 Hypertensive heart disease without heart failure: Secondary | ICD-10-CM | POA: Insufficient documentation

## 2022-12-23 DIAGNOSIS — E785 Hyperlipidemia, unspecified: Secondary | ICD-10-CM | POA: Diagnosis not present

## 2022-12-23 LAB — ECHOCARDIOGRAM COMPLETE
Area-P 1/2: 3.03 cm2
S' Lateral: 3.8 cm

## 2022-12-23 NOTE — Telephone Encounter (Signed)
See note

## 2022-12-23 NOTE — Patient Instructions (Signed)
Brent Taylor , Thank you for taking time to come for your Medicare Wellness Visit. I appreciate your ongoing commitment to your health goals. Please review the following plan we discussed and let me know if I can assist you in the future.   These are the goals we discussed:  Goals      Lifestyle Change-Hypertension     Timeframe:  Short-Term Goal Priority:  High Start Date: 12/09/20                            Expected End Date: 04/09/2021  Follow Up Date 04/09/2021 - agree to work together to make changes - ask questions to understand    Why is this important?   The changes that you are asked to make may be hard to do.  This is especially true when the changes are life-long.  Knowing why it is important to you is the first step.  Working on the change with your family or support person helps you not feel alone.  Reward yourself and family or support person when goals are met. This can be an activity you choose like bowling, hiking, biking, swimming or shooting hoops.     Notes:      Patient Stated     Patient Stated     Get blood pressure down      Patient Stated     Lose 25 lbs         This is a list of the screening recommended for you and due dates:  Health Maintenance  Topic Date Due   DTaP/Tdap/Td vaccine (1 - Tdap) 03/14/2014   Complete foot exam   10/31/2019   Eye exam for diabetics  05/21/2022   Hemoglobin A1C  04/26/2023   Yearly kidney health urinalysis for diabetes  10/27/2023   Yearly kidney function blood test for diabetes  10/29/2023   Medicare Annual Wellness Visit  12/23/2023   Pneumonia Vaccine  Completed   Flu Shot  Completed   Zoster (Shingles) Vaccine  Completed   HPV Vaccine  Aged Out   COVID-19 Vaccine  Discontinued    Advanced directives: copies in chart   Conditions/risks identified: lose 25 lbs   Next appointment: Follow up in one year for your annual wellness visit.   Preventive Care 21 Years and Older, Male  Preventive care refers to  lifestyle choices and visits with your health care provider that can promote health and wellness. What does preventive care include? A yearly physical exam. This is also called an annual well check. Dental exams once or twice a year. Routine eye exams. Ask your health care provider how often you should have your eyes checked. Personal lifestyle choices, including: Daily care of your teeth and gums. Regular physical activity. Eating a healthy diet. Avoiding tobacco and drug use. Limiting alcohol use. Practicing safe sex. Taking low doses of aspirin every day. Taking vitamin and mineral supplements as recommended by your health care provider. What happens during an annual well check? The services and screenings done by your health care provider during your annual well check will depend on your age, overall health, lifestyle risk factors, and family history of disease. Counseling  Your health care provider may ask you questions about your: Alcohol use. Tobacco use. Drug use. Emotional well-being. Home and relationship well-being. Sexual activity. Eating habits. History of falls. Memory and ability to understand (cognition). Work and work Statistician. Screening  You may have the  following tests or measurements: Height, weight, and BMI. Blood pressure. Lipid and cholesterol levels. These may be checked every 5 years, or more frequently if you are over 73 years old. Skin check. Lung cancer screening. You may have this screening every year starting at age 72 if you have a 30-pack-year history of smoking and currently smoke or have quit within the past 15 years. Fecal occult blood test (FOBT) of the stool. You may have this test every year starting at age 17. Flexible sigmoidoscopy or colonoscopy. You may have a sigmoidoscopy every 5 years or a colonoscopy every 10 years starting at age 51. Prostate cancer screening. Recommendations will vary depending on your family history and other  risks. Hepatitis C blood test. Hepatitis B blood test. Sexually transmitted disease (STD) testing. Diabetes screening. This is done by checking your blood sugar (glucose) after you have not eaten for a while (fasting). You may have this done every 1-3 years. Abdominal aortic aneurysm (AAA) screening. You may need this if you are a current or former smoker. Osteoporosis. You may be screened starting at age 21 if you are at high risk. Talk with your health care provider about your test results, treatment options, and if necessary, the need for more tests. Vaccines  Your health care provider may recommend certain vaccines, such as: Influenza vaccine. This is recommended every year. Tetanus, diphtheria, and acellular pertussis (Tdap, Td) vaccine. You may need a Td booster every 10 years. Zoster vaccine. You may need this after age 56. Pneumococcal 13-valent conjugate (PCV13) vaccine. One dose is recommended after age 64. Pneumococcal polysaccharide (PPSV23) vaccine. One dose is recommended after age 78. Talk to your health care provider about which screenings and vaccines you need and how often you need them. This information is not intended to replace advice given to you by your health care provider. Make sure you discuss any questions you have with your health care provider. Document Released: 10/24/2015 Document Revised: 06/16/2016 Document Reviewed: 07/29/2015 Elsevier Interactive Patient Education  2017 [employer] Prevention in the Home Falls can cause injuries. They can happen to people of all ages. There are many things you can do to make your home safe and to help prevent falls. What can I do on the outside of my home? Regularly fix the edges of walkways and driveways and fix any cracks. Remove anything that might make you trip as you walk through a door, such as a raised step or threshold. Trim any bushes or trees on the path to your home. Use bright outdoor lighting. Clear  any walking paths of anything that might make someone trip, such as rocks or tools. Regularly check to see if handrails are loose or broken. Make sure that both sides of any steps have handrails. Any raised decks and porches should have guardrails on the edges. Have any leaves, snow, or ice cleared regularly. Use sand or salt on walking paths during winter. Clean up any spills in your garage right away. This includes oil or grease spills. What can I do in the bathroom? Use night lights. Install grab bars by the toilet and in the tub and shower. Do not use towel bars as grab bars. Use non-skid mats or decals in the tub or shower. If you need to sit down in the shower, use a plastic, non-slip stool. Keep the floor dry. Clean up any water that spills on the floor as soon as it happens. Remove soap buildup in the tub or shower  regularly. Attach bath mats securely with double-sided non-slip rug tape. Do not have throw rugs and other things on the floor that can make you trip. What can I do in the bedroom? Use night lights. Make sure that you have a light by your bed that is easy to reach. Do not use any sheets or blankets that are too big for your bed. They should not hang down onto the floor. Have a firm chair that has side arms. You can use this for support while you get dressed. Do not have throw rugs and other things on the floor that can make you trip. What can I do in the kitchen? Clean up any spills right away. Avoid walking on wet floors. Keep items that you use a lot in easy-to-reach places. If you need to reach something above you, use a strong step stool that has a grab bar. Keep electrical cords out of the way. Do not use floor polish or wax that makes floors slippery. If you must use wax, use non-skid floor wax. Do not have throw rugs and other things on the floor that can make you trip. What can I do with my stairs? Do not leave any items on the stairs. Make sure that there  are handrails on both sides of the stairs and use them. Fix handrails that are broken or loose. Make sure that handrails are as long as the stairways. Check any carpeting to make sure that it is firmly attached to the stairs. Fix any carpet that is loose or worn. Avoid having throw rugs at the top or bottom of the stairs. If you do have throw rugs, attach them to the floor with carpet tape. Make sure that you have a light switch at the top of the stairs and the bottom of the stairs. If you do not have them, ask someone to add them for you. What else can I do to help prevent falls? Wear shoes that: Do not have high heels. Have rubber bottoms. Are comfortable and fit you well. Are closed at the toe. Do not wear sandals. If you use a stepladder: Make sure that it is fully opened. Do not climb a closed stepladder. Make sure that both sides of the stepladder are locked into place. Ask someone to hold it for you, if possible. Clearly mark and make sure that you can see: Any grab bars or handrails. First and last steps. Where the edge of each step is. Use tools that help you move around (mobility aids) if they are needed. These include: Canes. Walkers. Scooters. Crutches. Turn on the lights when you go into a dark area. Replace any light bulbs as soon as they burn out. Set up your furniture so you have a clear path. Avoid moving your furniture around. If any of your floors are uneven, fix them. If there are any pets around you, be aware of where they are. Review your medicines with your doctor. Some medicines can make you feel dizzy. This can increase your chance of falling. Ask your doctor what other things that you can do to help prevent falls. This information is not intended to replace advice given to you by your health care provider. Make sure you discuss any questions you have with your health care provider. Document Released: 07/24/2009 Document Revised: 03/04/2016 Document Reviewed:  11/01/2014 Elsevier Interactive Patient Education  2017 [employer].

## 2022-12-23 NOTE — Progress Notes (Signed)
I connected with  Mandeep Pospisil on 12/23/22 by a audio enabled telemedicine application and verified that I am speaking with the correct person using two identifiers.  Patient Location: Home  Provider Location: Office/Clinic  I discussed the limitations of evaluation and management by telemedicine. The patient expressed understanding and agreed to proceed.   Subjective:   Brent Taylor is a 82 y.o. male who presents for Medicare Annual/Subsequent preventive examination.  Review of Systems     Cardiac Risk Factors include: advanced age (>67mn, >>31women);diabetes mellitus;dyslipidemia;male gender;hypertension;obesity (BMI >30kg/m2)     Objective:    There were no vitals filed for this visit. There is no height or weight on file to calculate BMI.     12/23/2022    9:37 AM 11/03/2022    9:05 AM 12/03/2021    9:31 AM 12/04/2020    9:37 AM 08/11/2020    1:16 PM 07/17/2019    2:49 PM 09/14/2016    8:07 AM  Advanced Directives  Does Patient Have a Medical Advance Directive? Yes Yes Yes Yes Yes Yes Yes  Type of AParamedicof APort RepublicLiving will HEncampmentLiving will Living will Living will;Healthcare Power of [place]Living will Living will;Healthcare Power of Attorney   Does patient want to make changes to medical advance directive? No - Patient declined   No - Patient declined  No - Patient declined   Copy of [place] Chart? Yes - validated most recent copy scanned in chart (See row information) No - copy requested  No - copy requested No - copy requested No - copy requested     Current Medications (verified) Outpatient Encounter Medications as of 12/23/2022  Medication Sig   amoxicillin (AMOXIL) 500 MG capsule Take 500 mg by mouth 3 (three) times daily.   Ascorbic Acid (VITAMIN C PO) Take by mouth.   Blood Glucose Monitoring Suppl (ONE TOUCH ULTRA 2) w/Device KIT Use daily to check blood sugar.    Cyanocobalamin (B-12 PO) Take 1 tablet by mouth daily.   ELIQUIS 5 MG TABS tablet TAKE 1 TABLET BY MOUTH TWICE A DAY   FLUoxetine (PROZAC) 40 MG capsule TAKE ONE CAPSULE BY MOUTH DAILY   fluticasone (FLONASE) 50 MCG/ACT nasal spray Place 1 spray into both nostrils daily as needed for allergies or rhinitis.   Lancets (ONETOUCH ULTRASOFT) lancets USE TO TEST BLOOD SUGAR DAILY   losartan (COZAAR) 50 MG tablet TAKE 1 TABLET BY MOUTH DAILY   MAGNESIUM PO Take 1 tablet by mouth daily.   metoprolol tartrate (LOPRESSOR) 25 MG tablet TAKE 1/2 TABLET BY MOUTH TWICE A DAY   Omega-3 Fatty Acids (FISH OIL PO) Take 1 capsule by mouth daily.   ONETOUCH ULTRA test strip USE TO TEST BLOOD SUGAR DAILY AS DIRECTED   pantoprazole (PROTONIX) 20 MG tablet TAKE ONE TABLET BY MOUTH DAILY   pravastatin (PRAVACHOL) 80 MG tablet Take 1 tablet (80 mg total) by mouth every evening.   traMADol (ULTRAM) 50 MG tablet as needed for moderate pain or severe pain.   Turmeric (QC TUMERIC COMPLEX) 500 MG CAPS Take by mouth daily at 6 (six) AM.   tadalafil (CIALIS) 10 MG tablet Take 1 tablet (10 mg total) by mouth daily. (Patient not taking: Reported on 12/23/2022)   [DISCONTINUED] ondansetron (ZOFRAN) 4 MG tablet Take 1 tablet (4 mg total) by mouth every 8 (eight) hours as needed for nausea or vomiting.   No facility-administered encounter medications on  file as of 12/23/2022.    Allergies (verified) Lipitor [atorvastatin calcium]   History: Past Medical History:  Diagnosis Date   Atrial fibrillation ([place])    Cancer ([place])    hx - melanoma   Chicken pox    Colon polyps    Coronary artery disease    Depression    No need for therapy.  Improved   Diabetes ([facility])    Diverticula, colon    Dyspnea    with exertion   GERD (gastroesophageal reflux disease)    Hemorrhoids    Hyperlipidemia    Hypertension    Sepsis ([place])    Sepsis ([place])    Sleep apnea    Tinnitus    UTI (lower urinary tract infection)    Past  Surgical History:  Procedure Laterality Date   CARDIAC CATHETERIZATION     LUMBAR LAMINECTOMY/DECOMPRESSION MICRODISCECTOMY N/A 11/08/2022   Procedure: LUMBAR FOUR FIVE DECOMPRESSION AND IN SITU FUSION;  Surgeon: Melina Schools, MD;  Location: [facility];  Service: Orthopedics;  Laterality: N/A;  137mn   MELANOMA EXCISION     LEFT CHEST   MELANOMA EXCISION     Family History  Problem Relation Age of Onset   Heart Problems Mother        Skipping   Social History   Socioeconomic History   Marital status: Married    Spouse name: Not on file   Number of children: 2   Years of education: Not on file   Highest education level: Not on file  Occupational History   Occupation: MCompany secretary  Occupation: retired  Tobacco Use   Smoking status: Never   Smokeless tobacco: Never  VScientific laboratory technicianUse: Never used  Substance and Sexual Activity   Alcohol use: No   Drug use: No   Sexual activity: Not on file  Other Topics Concern   Not on file  Social History Narrative   [place]   Retired [employer]  Attends LHintonStrain: Low Risk  (12/23/2022)   Overall Financial Resource Strain (CARDIA)    Difficulty of Paying Living Expenses: Not hard at all  Food Insecurity: No Food Insecurity (12/23/2022)   Hunger Vital Sign    Worried About Running Out of Food in the Last Year: Never true    RChurchvillein the Last Year: Never true  Transportation Needs: No Transportation Needs (12/23/2022)   PRAPARE - THydrologist(Medical): No    Lack of Transportation (Non-Medical): No  Physical Activity: Insufficiently Active (12/23/2022)   Exercise Vital Sign    Days of Exercise per Week: 5 days    Minutes of Exercise per Session: 20 min  Stress: No Stress Concern Present (12/23/2022)   [place]   Feeling  of Stress : Not at all  Social Connections: [place](12/23/2022)   Social Connection and Isolation Panel [NHANES]    Frequency of Communication with Friends and Family: Three times a week    Frequency of Social Gatherings with Friends and Family: Twice a week    Attends Religious Services: More than 4 times per year    Active Member of CGenuine Partsor Organizations: Yes    Attends CArchivistMeetings: 1 to 4 times per year    Marital Status: Married    Tobacco  Counseling Counseling given: Not Answered   Clinical Intake:  Pre-visit preparation completed: Yes  Pain : No/denies pain     BMI - recorded: 32.14 Nutritional Status: BMI > 30  Obese Nutritional Risks: None Diabetes: Yes CBG done?: Yes (pt stated 179) CBG resulted in Enter/ Edit results?: No Did pt. bring in CBG monitor from home?: No  How often do you need to have someone help you when you read instructions, pamphlets, or other written materials from your doctor or pharmacy?: 1 - Never  Diabetic?Nutrition Risk Assessment:  Has the patient had any N/V/D within the last 2 months?  No  Does the patient have any non-healing wounds?  No  Has the patient had any unintentional weight loss or weight gain?  No   Diabetes:  Is the patient diabetic?  Yes  If diabetic, was a CBG obtained today?  Yes  Did the patient bring in their glucometer from home?  No  How often do you monitor your CBG's? Daily .   Financial Strains and Diabetes Management:  Are you having any financial strains with the device, your supplies or your medication? No .  Does the patient want to be seen by Chronic Care Management for management of their diabetes?  No  Would the patient like to be referred to a Nutritionist or for Diabetic Management?  No   Diabetic Exams:  Diabetic Eye Exam: Overdue for diabetic eye exam. Pt has been advised about the importance in completing this exam. Patient advised to call and schedule an eye  exam. Diabetic Foot Exam: Overdue, Pt has been advised about the importance in completing this exam. Pt is scheduled for diabetic foot exam on next appt .  Interpreter Needed?: No  Information entered by :: Charlott Rakes, LPN   Activities of Daily Living    12/23/2022    9:19 AM 11/03/2022    9:09 AM  In your present state of health, do you have any difficulty performing the following activities:  Hearing? 0   Vision? 0   Difficulty concentrating or making decisions? 0   Walking or climbing stairs? 1   Comment uses cane   Dressing or bathing? 0   Doing errands, shopping? 0 0  Preparing Food and eating ? N   Using the Toilet? N   In the past six months, have you accidently leaked urine? N   Do you have problems with loss of bowel control? N   Managing your Medications? N   Managing your Finances? N   Housekeeping or managing your Housekeeping? N     Patient Care Team: [name], MD as PCP - General (Family Medicine) Werner Lean, MD as PCP - Cardiology (Cardiology) Sueanne Margarita, MD as PCP - Sleep Medicine (Cardiology) Madelin Rear, [facility] (Inactive) (Pharmacist) Madelin Rear, [facility] (Inactive) as Pharmacist (Pharmacist)  Indicate any recent Medical Services you may have received from other than [facility] providers in the past year (date may be approximate).     Assessment:   This is a routine wellness examination for Brent Taylor.  Hearing/Vision screen Hearing Screening - Comments:: Pt wears hearing aids  Vision Screening - Comments:: Pt follows up with dr Meryl Crutch for annual eye exams   Dietary issues and exercise activities discussed: Current Exercise Habits: Home exercise routine, Type of exercise: stretching;Other - see comments (PT exercise), Time (Minutes): 20, Frequency (Times/Week): 5, Weekly Exercise (Minutes/Week): 100   Goals Addressed  This Visit's Progress    Patient Stated       Lose 25 lbs        Depression Screen    12/23/2022     9:35 AM 10/26/2022    8:04 AM 04/22/2022    8:59 AM 01/26/2022    9:11 AM 12/03/2021    9:30 AM 10/21/2021    8:45 AM 12/04/2020   10:17 AM  PHQ 2/9 Scores  PHQ - 2 Score 0 0 0 0 0 0 0  PHQ- 9 Score 0 0  0       Fall Risk    12/23/2022    9:19 AM 10/26/2022    8:05 AM 04/22/2022    8:59 AM 01/26/2022    9:11 AM 12/03/2021    9:32 AM  Fall Risk   Falls in the past year? 0 0  0 0  Number falls in past yr: 0 0 1 0 0  Injury with Fall? 0 0 0  0  Risk for fall due to : Impaired balance/gait;Impaired vision Impaired balance/gait;No Fall Risks Other (Comment) No Fall Risks Impaired vision;Impaired balance/gait  Follow up Falls prevention discussed    Falls prevention discussed    FALL RISK PREVENTION PERTAINING TO THE HOME:  Any stairs in or around the home? Yes  If so, are there any without handrails? No  Home free of loose throw rugs in walkways, pet beds, electrical cords, etc? Yes  Adequate lighting in your home to reduce risk of falls? Yes   ASSISTIVE DEVICES UTILIZED TO PREVENT FALLS:  Life alert? No  Use of a cane, walker or w/c? Yes  Grab bars in the bathroom? Yes  Shower chair or bench in shower? Yes  Elevated toilet seat or a handicapped toilet? Yes   TIMED UP AND GO:  Was the test performed? No .   Cognitive Function:        12/23/2022    9:38 AM 12/03/2021    9:36 AM 08/11/2020    1:20 PM 07/17/2019    2:51 PM 09/14/2016    8:12 AM  6CIT Screen  What Year? 0 points 0 points 0 points 0 points 0 points  What month? 0 points 0 points 0 points 0 points 0 points  What time? 0 points 0 points  0 points 0 points  Count back from 20 0 points 0 points 0 points 0 points 0 points  Months in reverse 0 points 0 points 0 points 0 points 0 points  Repeat phrase 0 points 0 points 0 points 0 points 0 points  Total Score 0 points 0 points  0 points 0 points    Immunizations Immunization History  Administered Date(s) Administered   Fluad Quad(high Dose 65+) 06/25/2021,  07/29/2022   Influenza Split 07/14/2012   Influenza, High Dose Seasonal PF 07/28/2015, 09/14/2016, 10/07/2017, 07/11/2018, 05/31/2019   Influenza,inj,Quad PF,6+ Mos 07/16/2013, 07/25/2014   Influenza,inj,quad, With Preservative 07/16/2019   PFIZER(Purple Top)SARS-COV-2 Vaccination 11/19/2019, 12/14/2019, 08/22/2020   Pneumococcal Conjugate-13 03/13/2014   Pneumococcal Polysaccharide-23 07/16/2013   Tetanus 03/13/2014   Zoster Recombinat (Shingrix) 02/09/2022, 05/07/2022    TDAP status: Up to date  Flu Vaccine status: Up to date  Pneumococcal vaccine status: Up to date  Covid-19 vaccine status: Completed vaccines  Qualifies for Shingles Vaccine? Yes   Zostavax completed Yes   Shingrix Completed?: Yes  Screening Tests Health Maintenance  Topic Date Due   DTaP/Tdap/Td (1 - Tdap) 03/14/2014   FOOT EXAM  10/31/2019  OPHTHALMOLOGY EXAM  05/21/2022   HEMOGLOBIN A1C  04/26/2023   Diabetic kidney evaluation - Urine ACR  10/27/2023   Diabetic kidney evaluation - eGFR measurement  10/29/2023   Medicare Annual Wellness (AWV)  12/23/2023   Pneumonia Vaccine 67+ Years old  Completed   INFLUENZA VACCINE  Completed   Zoster Vaccines- Shingrix  Completed   HPV VACCINES  Aged Out   COVID-19 Vaccine  Discontinued    Health Maintenance  Health Maintenance Due  Topic Date Due   DTaP/Tdap/Td (1 - Tdap) 03/14/2014   FOOT EXAM  10/31/2019   OPHTHALMOLOGY EXAM  05/21/2022    Colorectal cancer screening: No longer required.    Additional Screening:   Vision Screening: Recommended annual ophthalmology exams for early detection of glaucoma and other disorders of the eye. Is the patient up to date with their annual eye exam?  No Who is the provider or what is the name of the office in which the patient attends annual eye exams? Dr Meryl Crutch  If pt is not established with a provider, would they like to be referred to a provider to establish care? No .   Dental Screening: Recommended  annual dental exams for proper oral hygiene  Community Resource Referral / Chronic Care Management: CRR required this visit?  No   CCM required this visit?  No      Plan:     I have personally reviewed and noted the following in the patient's chart:   Medical and social history Use of alcohol, tobacco or illicit drugs  Current medications and supplements including opioid prescriptions. Patient is currently taking opioid prescriptions. Information provided to patient regarding non-opioid alternatives. Patient advised to discuss non-opioid treatment plan with their provider. Functional ability and status Nutritional status Physical activity Advanced directives List of other physicians Hospitalizations, surgeries, and ER visits in previous 12 months Vitals Screenings to include cognitive, depression, and falls Referrals and appointments  In addition, I have reviewed and discussed with patient certain preventive protocols, quality metrics, and best practice recommendations. A written personalized care plan for preventive services as well as general preventive health recommendations were provided to patient.     Willette Brace, LPN   [id number]   Nurse Notes: none

## 2022-12-23 NOTE — Telephone Encounter (Signed)
Can we have him come in to discuss starting medications?  Brent Taylor. [name], MD 12/23/2022 3:20 PM

## 2022-12-24 ENCOUNTER — Telehealth: Payer: Self-pay | Admitting: Internal Medicine

## 2022-12-24 DIAGNOSIS — N390 Urinary tract infection, site not specified: Secondary | ICD-10-CM | POA: Diagnosis not present

## 2022-12-24 DIAGNOSIS — I4891 Unspecified atrial fibrillation: Secondary | ICD-10-CM | POA: Diagnosis not present

## 2022-12-24 NOTE — Telephone Encounter (Signed)
Patient c/o Palpitations:  High priority if patient c/o lightheadedness, shortness of breath, or chest pain  How long have you had palpitations/irregular HR/ Afib? Are you having the symptoms now?  Went into afib this morning, still in afib at urgent care currently  Are you currently experiencing lightheadedness, SOB or CP?  No   Do you have a history of afib (atrial fibrillation) or irregular heart rhythm?  Yes   Have you checked your BP or HR? (document readings if available):  3/15: 118 HR currently  Are you experiencing any other symptoms?  UTI   Patient would like to know what to do about HR

## 2022-12-24 NOTE — Telephone Encounter (Signed)
Called pt in regards to HR 118.  Reports went to Urgent Care today dx with UTI.  EKG performed and noted Afib HR 118.  Calling our office to f/u. Pt denies SOB, palpitations.  Has not missed any doses of Eliquis or Metoprolol. Advised pt HR of 118 and asymptomatic is not of concern at this time.  Explained to pt that illness/ infection can cause Afib episodes.  Advised pt to monitor BP/HR if HR is staying above 120' s and is symptomatic to take an extra dose of metoprolol 12.5 mg X1.  If symptoms persist to call our oncall line if feels really bad to just go to the ED for evaluation.   Pt expresses understanding no further concerns at this time.

## 2022-12-26 ENCOUNTER — Encounter: Payer: Self-pay | Admitting: Internal Medicine

## 2022-12-26 DIAGNOSIS — I77819 Aortic ectasia, unspecified site: Secondary | ICD-10-CM

## 2022-12-27 ENCOUNTER — Ambulatory Visit (INDEPENDENT_AMBULATORY_CARE_PROVIDER_SITE_OTHER): Payer: Medicare HMO | Admitting: Family Medicine

## 2022-12-27 ENCOUNTER — Encounter: Payer: Self-pay | Admitting: Family Medicine

## 2022-12-27 VITALS — BP 120/84 | HR 89 | Temp 97.5°F | Ht 72.0 in | Wt 230.0 lb

## 2022-12-27 DIAGNOSIS — I152 Hypertension secondary to endocrine disorders: Secondary | ICD-10-CM

## 2022-12-27 DIAGNOSIS — E1159 Type 2 diabetes mellitus with other circulatory complications: Secondary | ICD-10-CM | POA: Diagnosis not present

## 2022-12-27 DIAGNOSIS — I480 Paroxysmal atrial fibrillation: Secondary | ICD-10-CM | POA: Diagnosis not present

## 2022-12-27 NOTE — Progress Notes (Signed)
   Brent Taylor is a 82 y.o. male who presents today for an office visit.  Assessment/Plan:  New/Acute Problems: UTI Getting better on cipro that was started at urgent care. He will finish his course of antibiotics. Encouraged hydration.  We discussed reasons to return to care.  Would like to come back in 1 to 2 weeks to recheck urine culture to confirm eradication.  Chronic Problems Addressed Today: Type 2 diabetes mellitus with vascular disease ([place]) He is not currently on any medications.  Did have a elevated reading of 170on his home glucometer last week that was likely due to his UTI.  He is now back to his normal range of 140s to 150s.  Last A1c was 7.1.  Patient is reluctant to restart any medications at this point.  I believe this is reasonable given his reasonably well-controlled A1c off meds.  He will continue to work on diet and exercise.  He can come back in a few months and we can recheck A1c at that time.  He will continue to monitor glucose at home and let us know if persistently elevated.  Atrial fibrillation (HCC) Irregular today.  He is anticoagulated on Eliquis 5 mg twice daily and rate controlled on metoprolol tartrate 12.5 mg twice daily.  Not currently having any symptoms.   Hypertension associated with diabetes ([place]) Blood pressure at goal today on Metroprolol tartrate 12.5 mg twice daily and losartan 50 mg daily.     Subjective:  HPI:  See A/p for status of chronic conditions.   He had a UTI  last week. This was diagnosed at urgent care. He was started on cipro. HE was also found to be back into afib at urgent care.  Not currently have any symptoms of A-fib.  UTI symptoms seem to be improving.  Main concern today is elevated blood sugar.  Last week was 179.  Over the last few days has been in the 140s to 150s.       Objective:  Physical Exam: BP 120/84   Pulse 89   Temp (!) 97.5 F (36.4 C) (Temporal)   Ht 6' (1.829 m)   Wt 230 lb (104.3 kg)   SpO2 97%    BMI 31.19 kg/m   Gen: No acute distress, resting comfortably CV: Irregular with no murmurs appreciated Pulm: Normal work of breathing, clear to auscultation bilaterally with no crackles, wheezes, or rhonchi Neuro: Grossly normal, moves all extremities Psych: Normal affect and thought content      Jeannene Tschetter M. [name], MD 12/27/2022 9:43 AM

## 2022-12-27 NOTE — Assessment & Plan Note (Signed)
Blood pressure at goal today on Metroprolol tartrate 12.5 mg twice daily and losartan 50 mg daily.

## 2022-12-27 NOTE — Patient Instructions (Signed)
It was very nice to see you today!  We do not need to start any medications at this point.  Please continue to work on diet and exercise.  It is okay for you to come back in a couple weeks to recheck a urine sample.  Please come back to see me in a few months to recheck your A1c  Take care, Dr [name]  PLEASE NOTE:  If you had any lab tests, please let us know if you have not heard back within a few days. You may see your results on mychart before we have a chance to review them but we will give you a call once they are reviewed by [place].   If we ordered any referrals today, please let us know if you have not heard from their office within the next week.   If you had any urgent prescriptions sent in today, please check with the pharmacy within an hour of our visit to make sure the prescription was transmitted appropriately.   Please try these tips to maintain a healthy lifestyle:  Eat at least 3 REAL meals and 1-2 snacks per day.  Aim for no more than 5 hours between eating.  If you eat breakfast, please do so within one hour of getting up.   Each meal should contain half fruits/vegetables, one quarter protein, and one quarter carbs (no bigger than a computer mouse)  Cut down on sweet beverages. This includes juice, soda, and sweet tea.   Drink at least 1 glass of water with each meal and aim for at least 8 glasses per day  Exercise at least 150 minutes every week.

## 2022-12-27 NOTE — Assessment & Plan Note (Signed)
He is not currently on any medications.  Did have a elevated reading of 170on his home glucometer last week that was likely due to his UTI.  He is now back to his normal range of 140s to 150s.  Last A1c was 7.1.  Patient is reluctant to restart any medications at this point.  I believe this is reasonable given his reasonably well-controlled A1c off meds.  He will continue to work on diet and exercise.  He can come back in a few months and we can recheck A1c at that time.  He will continue to monitor glucose at home and let us know if persistently elevated.

## 2022-12-27 NOTE — Assessment & Plan Note (Signed)
Irregular today.  He is anticoagulated on Eliquis 5 mg twice daily and rate controlled on metoprolol tartrate 12.5 mg twice daily.  Not currently having any symptoms.

## 2022-12-28 LAB — MULTIPLE MYELOMA PANEL, SERUM
Albumin SerPl Elph-Mcnc: 3.8 g/dL (ref 2.9–4.4)
Albumin/Glob SerPl: 1.4 (ref 0.7–1.7)
Alpha 1: 0.2 g/dL (ref 0.0–0.4)
Alpha2 Glob SerPl Elph-Mcnc: 0.7 g/dL (ref 0.4–1.0)
B-Globulin SerPl Elph-Mcnc: 1 g/dL (ref 0.7–1.3)
Gamma Glob SerPl Elph-Mcnc: 0.8 g/dL (ref 0.4–1.8)
Globulin, Total: 2.8 g/dL (ref 2.2–3.9)
IgA/Immunoglobulin A, Serum: 221 mg/dL (ref 61–437)
IgG (Immunoglobin G), Serum: 723 mg/dL (ref 603–1613)
IgM (Immunoglobulin M), Srm: 43 mg/dL (ref 15–143)
Total Protein: 6.6 g/dL (ref 6.0–8.5)

## 2022-12-28 LAB — SEDIMENTATION RATE: Sed Rate: 3 mm/hr (ref 0–30)

## 2022-12-28 LAB — ANA W/REFLEX IF POSITIVE: Anti Nuclear Antibody (ANA): NEGATIVE

## 2022-12-28 LAB — C-REACTIVE PROTEIN: CRP: 1 mg/L (ref 0–10)

## 2022-12-30 DIAGNOSIS — M5416 Radiculopathy, lumbar region: Secondary | ICD-10-CM | POA: Diagnosis not present

## 2022-12-31 DIAGNOSIS — G4733 Obstructive sleep apnea (adult) (pediatric): Secondary | ICD-10-CM | POA: Diagnosis not present

## 2023-01-03 DIAGNOSIS — M5416 Radiculopathy, lumbar region: Secondary | ICD-10-CM | POA: Diagnosis not present

## 2023-01-06 ENCOUNTER — Other Ambulatory Visit: Payer: Self-pay | Admitting: Family Medicine

## 2023-01-06 ENCOUNTER — Encounter: Payer: Self-pay | Admitting: Family Medicine

## 2023-01-06 ENCOUNTER — Other Ambulatory Visit: Payer: Self-pay

## 2023-01-06 DIAGNOSIS — M5451 Vertebrogenic low back pain: Secondary | ICD-10-CM | POA: Diagnosis not present

## 2023-01-06 DIAGNOSIS — N390 Urinary tract infection, site not specified: Secondary | ICD-10-CM

## 2023-01-10 ENCOUNTER — Ambulatory Visit (HOSPITAL_BASED_OUTPATIENT_CLINIC_OR_DEPARTMENT_OTHER)
Admission: RE | Admit: 2023-01-10 | Discharge: 2023-01-10 | Disposition: A | Discharge status: Home / Self Care | Payer: Medicare HMO | Source: Ambulatory Visit | Attending: Internal Medicine | Admitting: Internal Medicine

## 2023-01-10 DIAGNOSIS — M5416 Radiculopathy, lumbar region: Secondary | ICD-10-CM | POA: Diagnosis not present

## 2023-01-10 DIAGNOSIS — I77819 Aortic ectasia, unspecified site: Secondary | ICD-10-CM | POA: Diagnosis not present

## 2023-01-10 DIAGNOSIS — I7121 Aneurysm of the ascending aorta, without rupture: Secondary | ICD-10-CM | POA: Diagnosis not present

## 2023-01-10 LAB — POCT I-STAT CREATININE: Creatinine, Ser: 1.1 mg/dL (ref 0.61–1.24)

## 2023-01-10 MED ORDER — IOHEXOL 350 MG/ML SOLN
100.0000 mL | Freq: Once | INTRAVENOUS | Status: AC | PRN
  Administered 2023-01-10: 75 mL via INTRAVENOUS

## 2023-01-12 ENCOUNTER — Encounter: Payer: Self-pay | Admitting: Internal Medicine

## 2023-01-13 ENCOUNTER — Telehealth: Payer: Self-pay | Admitting: *Deleted

## 2023-01-13 ENCOUNTER — Ambulatory Visit: Payer: Medicare HMO | Attending: Cardiology | Admitting: Cardiology

## 2023-01-13 ENCOUNTER — Encounter: Payer: Self-pay | Admitting: Cardiology

## 2023-01-13 VITALS — BP 150/70 | HR 85 | Ht 72.0 in | Wt 239.0 lb

## 2023-01-13 DIAGNOSIS — G4733 Obstructive sleep apnea (adult) (pediatric): Secondary | ICD-10-CM

## 2023-01-13 DIAGNOSIS — I480 Paroxysmal atrial fibrillation: Secondary | ICD-10-CM

## 2023-01-13 DIAGNOSIS — M5416 Radiculopathy, lumbar region: Secondary | ICD-10-CM | POA: Diagnosis not present

## 2023-01-13 DIAGNOSIS — E669 Obesity, unspecified: Secondary | ICD-10-CM

## 2023-01-13 DIAGNOSIS — I152 Hypertension secondary to endocrine disorders: Secondary | ICD-10-CM | POA: Diagnosis not present

## 2023-01-13 DIAGNOSIS — E119 Type 2 diabetes mellitus without complications: Secondary | ICD-10-CM | POA: Diagnosis not present

## 2023-01-13 DIAGNOSIS — I10 Essential (primary) hypertension: Secondary | ICD-10-CM

## 2023-01-13 DIAGNOSIS — I2510 Atherosclerotic heart disease of native coronary artery without angina pectoris: Secondary | ICD-10-CM

## 2023-01-13 DIAGNOSIS — E1159 Type 2 diabetes mellitus with other circulatory complications: Secondary | ICD-10-CM

## 2023-01-13 NOTE — Progress Notes (Signed)
Virtual Visit via Video Note   This visit type was conducted due to national recommendations for restrictions regarding the COVID-19 Pandemic (e.g. social distancing) in an effort to limit this patient's exposure and mitigate transmission in our community.  Due to his co-morbid illnesses, this patient is at least at moderate risk for complications without adequate follow up.  This format is felt to be most appropriate for this patient at this time.  All issues noted in this document were discussed and addressed.  A limited physical exam was performed with this format.  Please refer to the patient's chart for his consent to telehealth for [facility].  Date:  01/13/2023   ID:  [name], DOB [birth year], MRN [id number] The patient was identified using 2 identifiers.  Patient Location: Home Provider Location: Home Office   PCP:  [name], MD   [facility] Providers Cardiologist:  Werner Lean, MD Sleep Medicine:  Fransico Him, MD     Evaluation Performed:  Follow-Up Visit  Chief Complaint:  OSA  History of Present Illness:    Brent Taylor is a 82 y.o. male with a hx of DM, HTN, PAF and HLD who was referred for home sleep study due to atrial fibrillation.  He was found to have mild OSA with an AHI of 14.1/hr and O2 sats as low as 83%.  He was started on auto CPAP but was intolerant to the [facility] and had a lot of nasal dryness.  I switched him to a nasal pillow mask and instructed him to adjust his humidity in his device.  He gets aggravated with his device and has not been using it much.  He says that his mask is leaking a lot once he gets to sleep.  He uses a full face mask and changes the cushion out every 2-4 weeks.  He has not tried any other masks and would like to try a different mask.  He feels the pressure is adequate.  Since going on PAP he feels rested in the am but still has to nap some during the day.  He says that he does not sleep well at night and wakes up  every hour and sometimes has to go sit in his chair to sleep.  He denies any significant mouth or nasal dryness or nasal congestion.  He does not think that he snores.    Past Medical History:  Diagnosis Date   Atrial fibrillation    Cancer    hx - melanoma   Chicken pox    Colon polyps    Coronary artery disease    Depression    No need for therapy.  Improved   Diabetes    Diverticula, colon    Dyspnea    with exertion   GERD (gastroesophageal reflux disease)    Hemorrhoids    Hyperlipidemia    Hypertension    Sepsis    Sepsis    Sleep apnea    Tinnitus    UTI (lower urinary tract infection)    Past Surgical History:  Procedure Laterality Date   CARDIAC CATHETERIZATION     LUMBAR LAMINECTOMY/DECOMPRESSION MICRODISCECTOMY N/A 11/08/2022   Procedure: LUMBAR FOUR FIVE DECOMPRESSION AND IN SITU FUSION;  Surgeon: Melina Schools, MD;  Location: [place];  Service: Orthopedics;  Laterality: N/A;  11min   MELANOMA EXCISION     LEFT CHEST   MELANOMA EXCISION       Current Meds  Medication Sig   Ascorbic Acid (VITAMIN  C PO) Take by mouth.   Blood Glucose Monitoring Suppl (ONE TOUCH ULTRA 2) w/Device KIT Use daily to check blood sugar.   Cyanocobalamin (B-12 PO) Take 1 tablet by mouth daily.   ELIQUIS 5 MG TABS tablet TAKE 1 TABLET BY MOUTH TWICE A DAY   FLUoxetine (PROZAC) 40 MG capsule TAKE ONE CAPSULE BY MOUTH DAILY   fluticasone (FLONASE) 50 MCG/ACT nasal spray Place 1 spray into both nostrils daily as needed for allergies or rhinitis.   Lancets (ONETOUCH ULTRASOFT) lancets USE TO TEST BLOOD SUGAR DAILY   losartan (COZAAR) 50 MG tablet TAKE 1 TABLET BY MOUTH DAILY   MAGNESIUM PO Take 1 tablet by mouth daily.   metoprolol tartrate (LOPRESSOR) 25 MG tablet TAKE 1/2 TABLET BY MOUTH TWICE A DAY   Omega-3 Fatty Acids (FISH OIL PO) Take 1 capsule by mouth daily.   ONETOUCH ULTRA test strip USE TO TEST BLOOD SUGAR DAILY AS DIRECTED   pantoprazole (PROTONIX) 20 MG tablet TAKE ONE  TABLET BY MOUTH DAILY   pravastatin (PRAVACHOL) 80 MG tablet Take 1 tablet (80 mg total) by mouth every evening.   tadalafil (CIALIS) 10 MG tablet Take 1 tablet (10 mg total) by mouth daily.   traMADol (ULTRAM) 50 MG tablet as needed for moderate pain or severe pain.   Turmeric (QC TUMERIC COMPLEX) 500 MG CAPS Take by mouth daily at 6 (six) AM.     Allergies:   Lipitor [atorvastatin calcium]   Social History   Tobacco Use   Smoking status: Never   Smokeless tobacco: Never  Vaping Use   Vaping Use: Never used  Substance Use Topics   Alcohol use: No   Drug use: No     Family Hx: The patient's family history includes Heart Problems in his mother.  ROS:   Please see the history of present illness.     All other systems reviewed and are negative.   Prior CV studies:   The following studies were reviewed today:  PAP compliance download  Labs/Other Tests and Data Reviewed:    EKG:  No ECG reviewed.  Recent Labs: 10/26/2022: ALT 23; Hemoglobin 14.1; Platelets 239.0; TSH 2.43 10/28/2022: BUN 19; Potassium 4.8; Sodium 140 01/10/2023: Creatinine, Ser 1.10   Recent Lipid Panel Lab Results  Component Value Date/Time   CHOL 134 10/26/2022 08:38 AM   CHOL 123 01/25/2022 09:10 AM   TRIG 127.0 10/26/2022 08:38 AM   HDL 38.30 (L) 10/26/2022 08:38 AM   HDL 33 (L) 01/25/2022 09:10 AM   CHOLHDL 4 10/26/2022 08:38 AM   LDLCALC 70 10/26/2022 08:38 AM   LDLCALC 68 01/25/2022 09:10 AM   LDLDIRECT 142.7 12/10/2011 02:36 PM    Wt Readings from Last 3 Encounters:  01/13/23 239 lb (108.4 kg)  12/27/22 230 lb (104.3 kg)  12/22/22 237 lb (107.5 kg)     Risk Assessment/Calculations:          Objective:    Vital Signs:  BP (!) 150/70   Pulse 85   Ht 6' (1.829 m)   Wt 239 lb (108.4 kg)   BMI 32.41 kg/m   Well nourished, well developed male in no acute distress. Well appearing, alert and conversant, regular work of breathing,  good skin color  Eyes- anicteric mouth- oral  mucosa is pink  neuro- grossly intact skin- no apparent rash or lesions or cyanosis ASSESSMENT & PLAN:    OSA - The patient is tolerating PAP therapy well without any problems. The PAP download  performed by his DME was personally reviewed and interpreted by me today and showed an AHI of 1.6/hr on auto CPAP from 4 to 15 cm H2O with 17% compliance in using more than 4 hours nightly.  The patient has been using and benefiting from PAP use and will continue to benefit from therapy.  -I encouraged him to be more compliant with his device -He has a lot of aches and pains in his back that are disrupting his sleep -I am going to order a nasal cushion mask with chin strap to see if he tolerates the mask better -I will get a download in 8 weeks.  HTN -BP controlled on exam today -Continue prescription drug with Losartan 50 mg daily and Lopressor 12.5 mg twice daily with as needed refills   Time:   Today, I have spent 15 minutes with the patient with telehealth technology discussing the above problems.     Medication Adjustments/Labs and Tests Ordered: Current medicines are reviewed at length with the patient today.  Concerns regarding medicines are outlined above.   Tests Ordered: No orders of the defined types were placed in this encounter.   Medication Changes: No orders of the defined types were placed in this encounter.   Follow Up:  In Person in 1 year(s)  Signed, Fransico Him, MD  01/13/2023 10:03 AM    [place]

## 2023-01-13 NOTE — Patient Instructions (Signed)
Medication Instructions:  Your physician recommends that you continue on your current medications as directed. Please refer to the Current Medication list given to you today.    Lab Work: None.  If you have labs (blood work) drawn today and your tests are completely normal, you will receive your results only by: [place] (if you have MyChart) OR A paper copy in the mail If you have any lab test that is abnormal or we need to change your treatment, we will call you to review the results.   Testing/Procedures: None.   Follow-Up:  Your next appointment:   1 year(s)  Provider:   Dr. Fransico Him, MD   Other Instructions Dr. Radford Pax has ordered a nasal cushion mask with a chin strap for you. A member of our sleep team or your DME company will reach out to get this delivered to you.

## 2023-01-13 NOTE — Telephone Encounter (Signed)
Order placed to [employer] via community message. 

## 2023-01-13 NOTE — Telephone Encounter (Signed)
-----   Message from Joni Reining, RN sent at 01/13/2023 10:15 AM EDT ----- Regarding: DME [facility],   Per Dr. Radford Pax:  "order a nasal cushion mask with chin strap and download in 8 weeks"  Thanks, Danae Chen

## 2023-01-15 ENCOUNTER — Other Ambulatory Visit: Payer: Self-pay | Admitting: Family Medicine

## 2023-01-17 ENCOUNTER — Ambulatory Visit (INDEPENDENT_AMBULATORY_CARE_PROVIDER_SITE_OTHER): Payer: Medicare HMO | Admitting: Family Medicine

## 2023-01-17 ENCOUNTER — Encounter: Payer: Self-pay | Admitting: Family Medicine

## 2023-01-17 VITALS — BP 128/87 | HR 82 | Temp 97.5°F | Ht 72.0 in | Wt 242.8 lb

## 2023-01-17 DIAGNOSIS — E1169 Type 2 diabetes mellitus with other specified complication: Secondary | ICD-10-CM

## 2023-01-17 DIAGNOSIS — M5416 Radiculopathy, lumbar region: Secondary | ICD-10-CM | POA: Diagnosis not present

## 2023-01-17 DIAGNOSIS — M48061 Spinal stenosis, lumbar region without neurogenic claudication: Secondary | ICD-10-CM | POA: Diagnosis not present

## 2023-01-17 DIAGNOSIS — E1159 Type 2 diabetes mellitus with other circulatory complications: Secondary | ICD-10-CM

## 2023-01-17 DIAGNOSIS — I152 Hypertension secondary to endocrine disorders: Secondary | ICD-10-CM

## 2023-01-17 DIAGNOSIS — E785 Hyperlipidemia, unspecified: Secondary | ICD-10-CM | POA: Diagnosis not present

## 2023-01-17 LAB — POCT GLYCOSYLATED HEMOGLOBIN (HGB A1C): Hemoglobin A1C: 6.6 % — AB (ref 4.0–5.6)

## 2023-01-17 NOTE — Patient Instructions (Signed)
It was very nice to see you today!  Your A1c looks great today.  Please keep up the great work.  Let us know if your back pain does not improve.  I will see you back in 6 months.  Come back sooner if needed.  Take care, Dr Jimmey Ralph  PLEASE NOTE:  If you had any lab tests, please let us know if you have not heard back within a few days. You may see your results on mychart before we have a chance to review them but we will give you a call once they are reviewed by [place].   If we ordered any referrals today, please let us know if you have not heard from their office within the next week.   If you had any urgent prescriptions sent in today, please check with the pharmacy within an hour of our visit to make sure the prescription was transmitted appropriately.   Please try these tips to maintain a healthy lifestyle:  Eat at least 3 REAL meals and 1-2 snacks per day.  Aim for no more than 5 hours between eating.  If you eat breakfast, please do so within one hour of getting up.   Each meal should contain half fruits/vegetables, one quarter protein, and one quarter carbs (no bigger than a computer mouse)  Cut down on sweet beverages. This includes juice, soda, and sweet tea.   Drink at least 1 glass of water with each meal and aim for at least 8 glasses per day  Exercise at least 150 minutes every week.

## 2023-01-17 NOTE — Assessment & Plan Note (Signed)
A1c 6.6.  He is doing well with diet and lifestyle modifications.  Home sugar readings typically in the 130s to 140s.  He will continue to work on diet and exercise.  Recheck A1c in 6 months.

## 2023-01-17 NOTE — Progress Notes (Signed)
   Brent Taylor is a 82 y.o. male who presents today for an office visit.  Assessment/Plan:  Chronic Problems Addressed Today: Type 2 diabetes mellitus with vascular disease (HCC) A1c 6.6.  He is doing well with diet and lifestyle modifications.  Home sugar readings typically in the 130s to 140s.  He will continue to work on diet and exercise.  Recheck A1c in 6 months.  Hypertension associated with diabetes (HCC) Blood pressure at goal on current regimen Metroprolol tartrate 12.5 mg twice daily and losartan 50 mg daily.  Back Pain No red flags and reassuring exam today.  Likely has a upper thoracic muscular strain.  His treatment options are little bit limited at this point due to him being on Eliquis.  We did discuss starting muscle relaxer however he deferred for now.  Encouraged heating pad and stretches.  He will follow-up with neurosurgery later this week.  We discussed reasons to return to care    Subjective:  HPI:  See A/p for status of chronic conditions. Patient is here today for follow up for diabetes.  Last A1c was 7.1 a few months ago.  Not currently on any medications.  He is working on diet and exercise. Sugars 130s-140s at home.   He has has been having some upper mid back pain for the last few days. Happened spontaneously. No obvious aggravating or alleviating factors. No weakness or numbness.       Objective:  Physical Exam: BP 128/87   Pulse 82   Temp (!) 97.5 F (36.4 C) (Temporal)   Ht 6' (1.829 m)   Wt 242 lb 12.8 oz (110.1 kg)   SpO2 97%   BMI 32.93 kg/m   Gen: No acute distress, resting comfortably CV: Regular rate and rhythm with no murmurs appreciated Pulm: Normal work of breathing, clear to auscultation bilaterally with no crackles, wheezes, or rhonchi MSK: - Back: No deformities.  Tenderness palpation along upper thoracic paraspinal muscles.  Neurovascular intact distally. Neuro: Grossly normal, moves all extremities Psych: Normal affect and thought  content      Brent Taylor M. Jimmey Ralph, MD 01/17/2023 8:30 AM

## 2023-01-17 NOTE — Assessment & Plan Note (Signed)
Blood pressure at goal on current regimen Metroprolol tartrate 12.5 mg twice daily and losartan 50 mg daily.

## 2023-01-20 DIAGNOSIS — M5416 Radiculopathy, lumbar region: Secondary | ICD-10-CM | POA: Diagnosis not present

## 2023-01-21 DIAGNOSIS — Z4889 Encounter for other specified surgical aftercare: Secondary | ICD-10-CM | POA: Diagnosis not present

## 2023-01-25 ENCOUNTER — Encounter (HOSPITAL_BASED_OUTPATIENT_CLINIC_OR_DEPARTMENT_OTHER): Payer: Self-pay | Admitting: Emergency Medicine

## 2023-01-25 ENCOUNTER — Emergency Department (HOSPITAL_BASED_OUTPATIENT_CLINIC_OR_DEPARTMENT_OTHER)
Admission: EM | Admit: 2023-01-25 | Discharge: 2023-01-25 | Disposition: A | Discharge status: Home / Self Care | Payer: Medicare HMO | Attending: Emergency Medicine | Admitting: Emergency Medicine

## 2023-01-25 ENCOUNTER — Emergency Department (HOSPITAL_BASED_OUTPATIENT_CLINIC_OR_DEPARTMENT_OTHER): Payer: Medicare HMO

## 2023-01-25 ENCOUNTER — Other Ambulatory Visit: Payer: Self-pay

## 2023-01-25 DIAGNOSIS — M47812 Spondylosis without myelopathy or radiculopathy, cervical region: Secondary | ICD-10-CM | POA: Diagnosis not present

## 2023-01-25 DIAGNOSIS — Y9301 Activity, walking, marching and hiking: Secondary | ICD-10-CM | POA: Diagnosis not present

## 2023-01-25 DIAGNOSIS — S0990XA Unspecified injury of head, initial encounter: Secondary | ICD-10-CM

## 2023-01-25 DIAGNOSIS — Z043 Encounter for examination and observation following other accident: Secondary | ICD-10-CM | POA: Diagnosis not present

## 2023-01-25 DIAGNOSIS — Z7901 Long term (current) use of anticoagulants: Secondary | ICD-10-CM | POA: Insufficient documentation

## 2023-01-25 DIAGNOSIS — Z23 Encounter for immunization: Secondary | ICD-10-CM | POA: Insufficient documentation

## 2023-01-25 DIAGNOSIS — S0083XA Contusion of other part of head, initial encounter: Secondary | ICD-10-CM | POA: Diagnosis not present

## 2023-01-25 DIAGNOSIS — W01198A Fall on same level from slipping, tripping and stumbling with subsequent striking against other object, initial encounter: Secondary | ICD-10-CM | POA: Insufficient documentation

## 2023-01-25 DIAGNOSIS — Z794 Long term (current) use of insulin: Secondary | ICD-10-CM | POA: Diagnosis not present

## 2023-01-25 DIAGNOSIS — S0181XA Laceration without foreign body of other part of head, initial encounter: Secondary | ICD-10-CM

## 2023-01-25 DIAGNOSIS — S0121XA Laceration without foreign body of nose, initial encounter: Secondary | ICD-10-CM | POA: Diagnosis not present

## 2023-01-25 MED ORDER — LIDOCAINE-EPINEPHRINE (PF) 2 %-1:200000 IJ SOLN
10.0000 mL | Freq: Once | INTRAMUSCULAR | Status: AC
  Administered 2023-01-25: 10 mL
  Filled 2023-01-25: qty 20

## 2023-01-25 MED ORDER — TETANUS-DIPHTH-ACELL PERTUSSIS 5-2.5-18.5 LF-MCG/0.5 IM SUSY
0.5000 mL | PREFILLED_SYRINGE | Freq: Once | INTRAMUSCULAR | Status: AC
  Administered 2023-01-25: 0.5 mL via INTRAMUSCULAR
  Filled 2023-01-25: qty 0.5

## 2023-01-25 NOTE — Discharge Instructions (Signed)
You are seen today in the emergency department and had injury.  The CT scans of your head, face and neck were reassuringly normal.  The sutures will need to be removed in 7 days, you do this in urgent care, ED or your primary care office.  Wash soap and water, do not fully submerge or soak.  Return to the ED for new or worsening or concerning symptoms.

## 2023-01-25 NOTE — ED Triage Notes (Signed)
Pt arrives to ED with c/o mechanical fall and facial injury today. He tripped and hit his face on a brick ramp.

## 2023-01-25 NOTE — ED Provider Notes (Signed)
[facility] AT [facility] Provider Note   CSN: [id number] Arrival date & time: 01/25/23  1806     History  Chief Complaint  Patient presents with   Fall   Head Injury    Brent Taylor is a 82 y.o. male.   Fall  Head Injury    Patient is an 82 year old male on Eliquis for PAF presenting to the emergency department due to fall.  Patient was walking, he tripped with shaking of the mower and landed on his face.  He did not lose consciousness, denies any visual changes, headache, prodromal headache, chest pain, shortness of breath.  He has no pain to the extremities, abdomen, does not feel short of breath.    Home Medications Prior to Admission medications   Medication Sig Start Date End Date Taking? Authorizing Provider  Ascorbic Acid (VITAMIN C PO) Take by mouth.    [provider]  Blood Glucose Monitoring Suppl (ONE TOUCH ULTRA 2) w/Device KIT Use daily to check blood sugar. 10/13/20   [name], MD  Cyanocobalamin (B-12 PO) Take 1 tablet by mouth daily.    [provider]  ELIQUIS 5 MG TABS tablet TAKE 1 TABLET BY MOUTH TWICE A DAY 11/09/22   [name], [name], MD  FLUoxetine (PROZAC) 40 MG capsule TAKE ONE CAPSULE BY MOUTH DAILY 07/12/22   [name], MD  fluticasone [facility]) 50 MCG/ACT nasal spray Place 1 spray into both nostrils daily as needed for allergies or rhinitis.    [provider]  Lancets [facility] ULTRASOFT) lancets USE TO TEST BLOOD SUGAR DAILY 12/31/21   [name], MD  losartan (COZAAR) 50 MG tablet TAKE 1 TABLET BY MOUTH DAILY 01/17/23   [name], MD  MAGNESIUM PO Take 1 tablet by mouth daily.    [provider]  metoprolol tartrate (LOPRESSOR) 25 MG tablet TAKE 1/2 TABLET BY MOUTH TWICE A DAY 10/05/22   [name], [name], MD  Omega-3 Fatty Acids (FISH OIL PO) Take 1 capsule by mouth daily.    [provider]  [facility] ULTRA test strip USE TO TEST BLOOD SUGAR  DAILY AS DIRECTED 12/31/21   [name], MD  pantoprazole (PROTONIX) 20 MG tablet TAKE ONE TABLET BY MOUTH DAILY 05/10/22   [name], MD  pravastatin (PRAVACHOL) 80 MG tablet Take 1 tablet (80 mg total) by mouth every evening. 12/15/22   [name], [place] Dragon A, MD  tadalafil (CIALIS) 10 MG tablet Take 1 tablet (10 mg total) by mouth daily. 10/26/22   [name], MD  traMADol Janean Sark) 50 MG tablet as needed for moderate pain or severe pain. 11/18/22   [provider]  Turmeric (QC TUMERIC COMPLEX) 500 MG CAPS Take by mouth daily at 6 (six) AM.    [provider]      Allergies    Lipitor [atorvastatin calcium]    Review of Systems   Review of Systems  Physical Exam Updated Vital Signs BP (!) 144/89 (BP Location: Right Arm)   Pulse 73   Temp (!) 97.5 F (36.4 C)   Resp 20   Ht 6' (1.829 m)   Wt 108.9 kg   SpO2 99%   BMI 32.55 kg/m  Physical Exam Vitals and nursing note reviewed. Exam conducted with a chaperone present.  Constitutional:      Appearance: Normal appearance.  HENT:     Head: Normocephalic.     Comments: 1.5 cm laceration nasal bridge, bruising  inferior periorbitally bilaterally where the glasses outline is.  There is no nasal crepitus, septal hematoma, hemotympanum, malocclusion. Eyes:     General: No scleral icterus.       Right eye: No discharge.        Left eye: No discharge.     Extraocular Movements: Extraocular movements intact.     Pupils: Pupils are equal, round, and reactive to light.  Cardiovascular:     Rate and Rhythm: Normal rate and regular rhythm.     Pulses: Normal pulses.     Heart sounds: Normal heart sounds.     No friction rub. No gallop.  Pulmonary:     Effort: Pulmonary effort is normal. No respiratory distress.     Breath sounds: Normal breath sounds.  Abdominal:     General: Abdomen is flat. Bowel sounds are normal. There is no distension.     Palpations: Abdomen is soft.     Tenderness: There is no  abdominal tenderness.  Musculoskeletal:        General: No tenderness. Normal range of motion.     Cervical back: Normal range of motion. No tenderness.  Skin:    General: Skin is warm and dry.     Coloration: Skin is not jaundiced.  Neurological:     Mental Status: He is alert. Mental status is at baseline.     Coordination: Coordination normal.     ED Results / Procedures / Treatments   Labs (all labs ordered are listed, but only abnormal results are displayed) Labs Reviewed - No data to display  EKG None  Radiology CT Head Wo Contrast  Result Date: 01/25/2023 CLINICAL DATA:  Fall EXAM: CT HEAD WITHOUT CONTRAST CT MAXILLOFACIAL WITHOUT CONTRAST CT CERVICAL SPINE WITHOUT CONTRAST TECHNIQUE: Multidetector CT imaging of the head, cervical spine, and maxillofacial structures were performed using the standard protocol without intravenous contrast. Multiplanar CT image reconstructions of the cervical spine and maxillofacial structures were also generated. RADIATION DOSE REDUCTION: This exam was performed according to the departmental dose-optimization program which includes automated exposure control, adjustment of the mA and/or kV according to patient size and/or use of iterative reconstruction technique. COMPARISON:  01/09/2022 CT maxillofacial, 11/11/2015 CT head, no prior CT cervical spine FINDINGS: CT HEAD FINDINGS Brain: No evidence of acute infarct, hemorrhage, mass, mass effect, or midline shift. No hydrocephalus or extra-axial fluid collection. Age related cerebral volume loss. Periventricular Dorval matter changes, likely the sequela of chronic small vessel ischemic disease. Vascular: No hyperdense vessel. Skull: Negative for fracture or focal lesion. Frontal scalp/forehead hematoma, midline/right of midline. CT MAXILLOFACIAL FINDINGS Osseous: No fracture or mandibular dislocation. No destructive process. Orbits: No traumatic or inflammatory finding. Status post bilateral lens  replacements. Sinuses: Mucosal thickening in the right frontal sinus, ethmoid air cells, and right maxillary sinus. Soft tissues: Midline forehead hematoma. CT CERVICAL SPINE FINDINGS Alignment: No traumatic listhesis. Skull base and vertebrae: No acute fracture. No primary bone lesion or focal pathologic process. Soft tissues and spinal canal: No prevertebral fluid or swelling. No visible canal hematoma. Disc levels: Degenerative changes in the cervical spine, most prominently at C5-C6 and C6-C7. Multilevel facet and uncovertebral hypertrophy. Upper chest: Negative. Other: None. IMPRESSION: 1. No acute intracranial abnormality. Frontal scalp/forehead hematoma. 2. No acute facial bone fracture. 3. No acute fracture or traumatic listhesis in the cervical spine. Electronically Signed   By: Wiliam Ke M.D.   On: 01/25/2023 20:07   CT Maxillofacial Wo Contrast  Result Date: 01/25/2023 CLINICAL DATA:  Fall EXAM: CT HEAD WITHOUT CONTRAST CT MAXILLOFACIAL WITHOUT CONTRAST CT CERVICAL SPINE WITHOUT CONTRAST TECHNIQUE: Multidetector CT imaging of the head, cervical spine, and maxillofacial structures were performed using the standard protocol without intravenous contrast. Multiplanar CT image reconstructions of the cervical spine and maxillofacial structures were also generated. RADIATION DOSE REDUCTION: This exam was performed according to the departmental dose-optimization program which includes automated exposure control, adjustment of the mA and/or kV according to patient size and/or use of iterative reconstruction technique. COMPARISON:  01/09/2022 CT maxillofacial, 11/11/2015 CT head, no prior CT cervical spine FINDINGS: CT HEAD FINDINGS Brain: No evidence of acute infarct, hemorrhage, mass, mass effect, or midline shift. No hydrocephalus or extra-axial fluid collection. Age related cerebral volume loss. Periventricular Perlmutter matter changes, likely the sequela of chronic small vessel ischemic disease. Vascular:  No hyperdense vessel. Skull: Negative for fracture or focal lesion. Frontal scalp/forehead hematoma, midline/right of midline. CT MAXILLOFACIAL FINDINGS Osseous: No fracture or mandibular dislocation. No destructive process. Orbits: No traumatic or inflammatory finding. Status post bilateral lens replacements. Sinuses: Mucosal thickening in the right frontal sinus, ethmoid air cells, and right maxillary sinus. Soft tissues: Midline forehead hematoma. CT CERVICAL SPINE FINDINGS Alignment: No traumatic listhesis. Skull base and vertebrae: No acute fracture. No primary bone lesion or focal pathologic process. Soft tissues and spinal canal: No prevertebral fluid or swelling. No visible canal hematoma. Disc levels: Degenerative changes in the cervical spine, most prominently at C5-C6 and C6-C7. Multilevel facet and uncovertebral hypertrophy. Upper chest: Negative. Other: None. IMPRESSION: 1. No acute intracranial abnormality. Frontal scalp/forehead hematoma. 2. No acute facial bone fracture. 3. No acute fracture or traumatic listhesis in the cervical spine. Electronically Signed   By: Wiliam Ke M.D.   On: 01/25/2023 20:07   CT Cervical Spine Wo Contrast  Result Date: 01/25/2023 CLINICAL DATA:  Fall EXAM: CT HEAD WITHOUT CONTRAST CT MAXILLOFACIAL WITHOUT CONTRAST CT CERVICAL SPINE WITHOUT CONTRAST TECHNIQUE: Multidetector CT imaging of the head, cervical spine, and maxillofacial structures were performed using the standard protocol without intravenous contrast. Multiplanar CT image reconstructions of the cervical spine and maxillofacial structures were also generated. RADIATION DOSE REDUCTION: This exam was performed according to the departmental dose-optimization program which includes automated exposure control, adjustment of the mA and/or kV according to patient size and/or use of iterative reconstruction technique. COMPARISON:  01/09/2022 CT maxillofacial, 11/11/2015 CT head, no prior CT cervical spine  FINDINGS: CT HEAD FINDINGS Brain: No evidence of acute infarct, hemorrhage, mass, mass effect, or midline shift. No hydrocephalus or extra-axial fluid collection. Age related cerebral volume loss. Periventricular Rogan matter changes, likely the sequela of chronic small vessel ischemic disease. Vascular: No hyperdense vessel. Skull: Negative for fracture or focal lesion. Frontal scalp/forehead hematoma, midline/right of midline. CT MAXILLOFACIAL FINDINGS Osseous: No fracture or mandibular dislocation. No destructive process. Orbits: No traumatic or inflammatory finding. Status post bilateral lens replacements. Sinuses: Mucosal thickening in the right frontal sinus, ethmoid air cells, and right maxillary sinus. Soft tissues: Midline forehead hematoma. CT CERVICAL SPINE FINDINGS Alignment: No traumatic listhesis. Skull base and vertebrae: No acute fracture. No primary bone lesion or focal pathologic process. Soft tissues and spinal canal: No prevertebral fluid or swelling. No visible canal hematoma. Disc levels: Degenerative changes in the cervical spine, most prominently at C5-C6 and C6-C7. Multilevel facet and uncovertebral hypertrophy. Upper chest: Negative. Other: None. IMPRESSION: 1. No acute intracranial abnormality. Frontal scalp/forehead hematoma. 2. No acute facial bone fracture. 3. No acute fracture or traumatic listhesis in the cervical spine.  Electronically Signed   By: Wiliam Ke M.D.   On: 01/25/2023 20:07    Procedures .[name] KitchenLaceration Repair  Date/Time: 01/25/2023 9:56 PM  Performed by: Charleen Kirks, Student-PA Authorized by: Theron Arista, PA-C   Consent:    Consent obtained:  Verbal   Consent given by:  Patient   Risks, benefits, and alternatives were discussed: yes     Risks discussed:  Infection, pain, poor cosmetic result and poor wound healing   Alternatives discussed:  No treatment and delayed treatment Universal protocol:    Procedure explained and questions answered to  patient or proxy's satisfaction: yes     Relevant documents present and verified: yes     Test results available: yes     Imaging studies available: yes     Required blood products, implants, devices, and special equipment available: yes     Site/side marked: yes     Immediately prior to procedure, a time out was called: yes     Patient identity confirmed:  Verbally with patient Anesthesia:    Anesthesia method:  Local infiltration   Local anesthetic:  Lidocaine 1% WITH epi Laceration details:    Location:  Face   Face location:  Nose   Length (cm):  1.5   Depth (mm):  2 Pre-procedure details:    Preparation:  Patient was prepped and draped in usual sterile fashion Exploration:    Hemostasis achieved with:  Direct pressure Treatment:    Area cleansed with:  Povidone-iodine Skin repair:    Repair method:  Sutures   Suture size:  5-0   Suture material:  Prolene   Suture technique:  Simple interrupted   Number of sutures:  2 Approximation:    Approximation:  Close Repair type:    Repair type:  Simple     Medications Ordered in ED Medications  lidocaine-EPINEPHrine (XYLOCAINE W/EPI) 2 %-1:200000 (PF) injection 10 mL (10 mLs Infiltration Given 01/25/23 2021)  Tdap (BOOSTRIX) injection 0.5 mL (0.5 mLs Intramuscular Given 01/25/23 2021)    ED Course/ Medical Decision Making/ A&P                             Medical Decision Making Amount and/or Complexity of Data Reviewed Radiology: ordered.  Risk Prescription drug management.   Patient presents to the emergency department due to head injury after mechanical fall.  Differential includes intracranial hemorrhage, basilar skull fracture, cervical spine injury, myalgias, facial fracture.  On exam there there is periorbital ecchymosis, no Battle sign, no malocclusion and a small laceration to the nasal bridge without a septal hematoma or crepitus.  He has no upper extremity pain, moving limbs and there is no reproducible bony  tenderness.  Lungs are clear, considered additional imaging but based on exam we will just proceed with CT head, maxillofacial and cervical spine.  Patient tetanus needed to be updated which we will do today.  CT head, maxillofacial cervical spine are reassuring without any acute process or emergent abnormality.  Laceration was repaired without complication.  Patient stable for outpatient follow-up.        Final Clinical Impression(s) / ED Diagnoses Final diagnoses:  Injury of head, initial encounter  Facial laceration, initial encounter    Rx / DC Orders ED Discharge Orders     None         Theron Arista, Cordelia Poche 01/25/23 2309    Ernie Avena, MD 01/25/23 2333

## 2023-01-25 NOTE — ED Notes (Signed)
Patient verbalizes understanding of discharge instructions. Opportunity for questioning and answers were provided. Armband removed by staff, pt discharged from ED to home via POV  

## 2023-01-25 NOTE — ED Notes (Signed)
Ambulatory with cane at this time, escorted to room and asked to change out of clothes into a gown at this time. A&Ox4

## 2023-01-25 NOTE — ED Notes (Signed)
Lac    Brent Taylor, [place] 01/25/23 2019

## 2023-01-26 ENCOUNTER — Encounter: Payer: Self-pay | Admitting: Family Medicine

## 2023-01-27 ENCOUNTER — Encounter: Payer: Self-pay | Admitting: Cardiology

## 2023-01-27 DIAGNOSIS — I10 Essential (primary) hypertension: Secondary | ICD-10-CM

## 2023-01-27 DIAGNOSIS — G4733 Obstructive sleep apnea (adult) (pediatric): Secondary | ICD-10-CM

## 2023-01-27 DIAGNOSIS — I2510 Atherosclerotic heart disease of native coronary artery without angina pectoris: Secondary | ICD-10-CM

## 2023-01-28 ENCOUNTER — Telehealth: Payer: Self-pay

## 2023-01-28 NOTE — Addendum Note (Signed)
Addended by: Reesa Chew on: 01/28/2023 11:37 AM   Modules accepted: Orders

## 2023-01-28 NOTE — Telephone Encounter (Signed)
     Patient  visit on 01/25/2023  at [facility] was for fall, head injury.  Have you been able to follow up with your primary care physician? Yes  The patient was or was not able to obtain any needed medicine or equipment. Patient was able to obtain medication.  Are there diet recommendations that you are having difficulty following? No  Patient expresses understanding of discharge instructions and education provided has no other needs at this time. Yes   Bayden Gil [facility]  [facility] Population Health Community Resource Care Guide   [email or web address]  ?? [phone]   Website: [email or web address]  [email or web address]

## 2023-01-31 ENCOUNTER — Other Ambulatory Visit: Payer: Self-pay | Admitting: Family Medicine

## 2023-02-01 ENCOUNTER — Ambulatory Visit (INDEPENDENT_AMBULATORY_CARE_PROVIDER_SITE_OTHER): Payer: Medicare HMO | Admitting: Family Medicine

## 2023-02-01 ENCOUNTER — Encounter: Payer: Self-pay | Admitting: Family Medicine

## 2023-02-01 VITALS — BP 108/68 | HR 75 | Temp 97.7°F | Ht 72.0 in | Wt 243.8 lb

## 2023-02-01 DIAGNOSIS — Z4802 Encounter for removal of sutures: Secondary | ICD-10-CM

## 2023-02-01 DIAGNOSIS — S0181XD Laceration without foreign body of other part of head, subsequent encounter: Secondary | ICD-10-CM | POA: Diagnosis not present

## 2023-02-01 DIAGNOSIS — I480 Paroxysmal atrial fibrillation: Secondary | ICD-10-CM

## 2023-02-01 DIAGNOSIS — W19XXXD Unspecified fall, subsequent encounter: Secondary | ICD-10-CM

## 2023-02-01 DIAGNOSIS — I152 Hypertension secondary to endocrine disorders: Secondary | ICD-10-CM | POA: Diagnosis not present

## 2023-02-01 DIAGNOSIS — E1159 Type 2 diabetes mellitus with other circulatory complications: Secondary | ICD-10-CM

## 2023-02-01 NOTE — Progress Notes (Signed)
   Brent Taylor is a 82 y.o. male who presents today for an office visit.  Assessment/Plan:  New/Acute Problems: Facial Laceration Suture removed today.  Tolerated well.  See below procedure note.  No signs of infection.  He can resume normal bathing and hygiene.  Discussed reasons to return to care.  Fall Doing well now.  No signs of occult injury.  We did discuss referral to PT for gait training however he deferred for now.  We discussed importance of avoidance of falls especially with him being on blood thinners.  He will let us know if he changes mind.  Chronic Problems Addressed Today: Atrial fibrillation (HCC) Anticoagulated on Eliquis 5 mg twice daily and rate controlled on Metroprolol tartrate 12.5 mg twice daily.  Discussed importance of avoidance of falls especially with him being on anticoagulation.  Hypertension associated with diabetes (HCC) Blood pressure at goal metoprolol tartrate 12.5 mg twice daily and losartan 50 mg daily.     Subjective:  HPI:  See A/P for status of chronic conditions.  Patient is here today for ED follow-up.  Went to the ED a week ago after falling.  Struck his face on a brick ramp.  States that he got tripped over a ramp going into a shed.  Had 2 stitches placed in the ED.  CT scan was performed which was reassuring without any signs of fracture.  It was discharged home.  Has done well the last week.  No recurrent falls.  No headache.  No vision change.  No weakness or numbness.  No signs of infection at his laceration sites.      Objective:  Physical Exam: BP 108/68   Pulse 75   Temp 97.7 F (36.5 C) (Temporal)   Ht 6' (1.829 m)   Wt 243 lb 12.8 oz (110.6 kg)   SpO2 99%   BMI 33.07 kg/m   Gen: No acute distress, resting comfortably Skin: Well-healed laceration on right nasal bridge.  2 simple interrupted sutures in place.  Scattered ecchymosis across face. Neuro: Grossly normal, moves all extremities Psych: Normal affect and thought  content  Procedure note Verbal consent was obtained.  Sutures were removed without complication.  He tolerated well.      Katina Degree. Jimmey Ralph, MD 02/01/2023 8:59 AM

## 2023-02-01 NOTE — Assessment & Plan Note (Signed)
Anticoagulated on Eliquis 5 mg twice daily and rate controlled on Metroprolol tartrate 12.5 mg twice daily.  Discussed importance of avoidance of falls especially with him being on anticoagulation.

## 2023-02-01 NOTE — Assessment & Plan Note (Signed)
Blood pressure at goal metoprolol tartrate 12.5 mg twice daily and losartan 50 mg daily.

## 2023-02-01 NOTE — Patient Instructions (Signed)
It was very nice to see you today!  We removed your stitches today.   Please let us know if you have any further issues.   Take care, Dr Jimmey Ralph  PLEASE NOTE:  If you had any lab tests, please let us know if you have not heard back within a few days. You may see your results on mychart before we have a chance to review them but we will give you a call once they are reviewed by [place].   If we ordered any referrals today, please let us know if you have not heard from their office within the next week.   If you had any urgent prescriptions sent in today, please check with the pharmacy within an hour of our visit to make sure the prescription was transmitted appropriately.   Please try these tips to maintain a healthy lifestyle:  Eat at least 3 REAL meals and 1-2 snacks per day.  Aim for no more than 5 hours between eating.  If you eat breakfast, please do so within one hour of getting up.   Each meal should contain half fruits/vegetables, one quarter protein, and one quarter carbs (no bigger than a computer mouse)  Cut down on sweet beverages. This includes juice, soda, and sweet tea.   Drink at least 1 glass of water with each meal and aim for at least 8 glasses per day  Exercise at least 150 minutes every week.

## 2023-02-03 ENCOUNTER — Encounter: Payer: Self-pay | Admitting: Internal Medicine

## 2023-02-03 DIAGNOSIS — D485 Neoplasm of uncertain behavior of skin: Secondary | ICD-10-CM | POA: Diagnosis not present

## 2023-02-03 DIAGNOSIS — Z85820 Personal history of malignant melanoma of skin: Secondary | ICD-10-CM | POA: Diagnosis not present

## 2023-02-03 DIAGNOSIS — G4733 Obstructive sleep apnea (adult) (pediatric): Secondary | ICD-10-CM | POA: Diagnosis not present

## 2023-02-03 DIAGNOSIS — Z85828 Personal history of other malignant neoplasm of skin: Secondary | ICD-10-CM | POA: Diagnosis not present

## 2023-02-03 DIAGNOSIS — L821 Other seborrheic keratosis: Secondary | ICD-10-CM | POA: Diagnosis not present

## 2023-02-03 DIAGNOSIS — C44622 Squamous cell carcinoma of skin of right upper limb, including shoulder: Secondary | ICD-10-CM | POA: Diagnosis not present

## 2023-02-03 NOTE — Telephone Encounter (Signed)
Patient is returning call.  °

## 2023-02-03 NOTE — Telephone Encounter (Signed)
Called pt back.  Reports pain only occurs when turns certain way.  Had back surgery on 11/08/22 and recently finished up rehab.  Does exercises at home and feels may have pulled a muscle.   Denies CP, SOB, sweating and jaw pain.  CT Aorta 01/10/23 Aorta size stable.  Advised pt to reach out to office that completed surgery for advise on pain.  Pt expresses understanding no further concerns at this time.

## 2023-02-03 NOTE — Telephone Encounter (Signed)
Called pt in regards to my chart message.  Pt began to explain symptoms; phone disconnected. I waited a few minutes and called back call went to voicemail.  Left a message asking pt to call back.

## 2023-02-04 ENCOUNTER — Other Ambulatory Visit: Payer: Self-pay

## 2023-02-04 ENCOUNTER — Ambulatory Visit (HOSPITAL_BASED_OUTPATIENT_CLINIC_OR_DEPARTMENT_OTHER)
Admission: EM | Admit: 2023-02-04 | Discharge: 2023-02-05 | Disposition: A | Discharge status: Home / Self Care | Payer: Medicare HMO | Attending: Gastroenterology | Admitting: Gastroenterology

## 2023-02-04 ENCOUNTER — Encounter (HOSPITAL_COMMUNITY): Payer: Self-pay

## 2023-02-04 ENCOUNTER — Encounter: Payer: Self-pay | Admitting: Family Medicine

## 2023-02-04 ENCOUNTER — Emergency Department (HOSPITAL_BASED_OUTPATIENT_CLINIC_OR_DEPARTMENT_OTHER): Payer: Medicare HMO

## 2023-02-04 DIAGNOSIS — W44F3XA Food entering into or through a natural orifice, initial encounter: Secondary | ICD-10-CM | POA: Diagnosis not present

## 2023-02-04 DIAGNOSIS — E669 Obesity, unspecified: Secondary | ICD-10-CM | POA: Diagnosis not present

## 2023-02-04 DIAGNOSIS — R112 Nausea with vomiting, unspecified: Secondary | ICD-10-CM

## 2023-02-04 DIAGNOSIS — Z7901 Long term (current) use of anticoagulants: Secondary | ICD-10-CM | POA: Insufficient documentation

## 2023-02-04 DIAGNOSIS — I493 Ventricular premature depolarization: Secondary | ICD-10-CM | POA: Insufficient documentation

## 2023-02-04 DIAGNOSIS — G4730 Sleep apnea, unspecified: Secondary | ICD-10-CM | POA: Diagnosis not present

## 2023-02-04 DIAGNOSIS — R079 Chest pain, unspecified: Secondary | ICD-10-CM | POA: Diagnosis not present

## 2023-02-04 DIAGNOSIS — I4891 Unspecified atrial fibrillation: Secondary | ICD-10-CM | POA: Insufficient documentation

## 2023-02-04 DIAGNOSIS — E119 Type 2 diabetes mellitus without complications: Secondary | ICD-10-CM | POA: Diagnosis not present

## 2023-02-04 DIAGNOSIS — X58XXXA Exposure to other specified factors, initial encounter: Secondary | ICD-10-CM | POA: Diagnosis not present

## 2023-02-04 DIAGNOSIS — I2510 Atherosclerotic heart disease of native coronary artery without angina pectoris: Secondary | ICD-10-CM | POA: Insufficient documentation

## 2023-02-04 DIAGNOSIS — K219 Gastro-esophageal reflux disease without esophagitis: Secondary | ICD-10-CM | POA: Insufficient documentation

## 2023-02-04 DIAGNOSIS — K3189 Other diseases of stomach and duodenum: Secondary | ICD-10-CM | POA: Diagnosis not present

## 2023-02-04 DIAGNOSIS — M1990 Unspecified osteoarthritis, unspecified site: Secondary | ICD-10-CM | POA: Insufficient documentation

## 2023-02-04 DIAGNOSIS — K449 Diaphragmatic hernia without obstruction or gangrene: Secondary | ICD-10-CM | POA: Insufficient documentation

## 2023-02-04 DIAGNOSIS — R0789 Other chest pain: Secondary | ICD-10-CM | POA: Diagnosis not present

## 2023-02-04 DIAGNOSIS — D649 Anemia, unspecified: Secondary | ICD-10-CM | POA: Diagnosis not present

## 2023-02-04 DIAGNOSIS — Z6833 Body mass index (BMI) 33.0-33.9, adult: Secondary | ICD-10-CM | POA: Diagnosis not present

## 2023-02-04 DIAGNOSIS — F32A Depression, unspecified: Secondary | ICD-10-CM | POA: Diagnosis not present

## 2023-02-04 DIAGNOSIS — I10 Essential (primary) hypertension: Secondary | ICD-10-CM | POA: Diagnosis not present

## 2023-02-04 DIAGNOSIS — T18128A Food in esophagus causing other injury, initial encounter: Secondary | ICD-10-CM | POA: Diagnosis not present

## 2023-02-04 DIAGNOSIS — Q399 Congenital malformation of esophagus, unspecified: Secondary | ICD-10-CM | POA: Diagnosis not present

## 2023-02-04 DIAGNOSIS — K222 Esophageal obstruction: Secondary | ICD-10-CM | POA: Diagnosis not present

## 2023-02-04 DIAGNOSIS — E1149 Type 2 diabetes mellitus with other diabetic neurological complication: Secondary | ICD-10-CM | POA: Diagnosis not present

## 2023-02-04 LAB — BASIC METABOLIC PANEL
Anion gap: 12 (ref 5–15)
BUN: 20 mg/dL (ref 8–23)
CO2: 23 mmol/L (ref 22–32)
Calcium: 9.3 mg/dL (ref 8.9–10.3)
Chloride: 107 mmol/L (ref 98–111)
Creatinine, Ser: 1.07 mg/dL (ref 0.61–1.24)
GFR, Estimated: >60 mL/min (ref >60)
Glucose, Bld: 123 mg/dL — ABNORMAL HIGH (ref 70–99)
Potassium: 4.3 mmol/L (ref 3.5–5.1)
Sodium: 142 mmol/L (ref 135–145)

## 2023-02-04 LAB — CBC
HCT: 45.1 % (ref 39.0–52.0)
Hemoglobin: 12.7 g/dL — ABNORMAL LOW (ref 13.0–17.0)
MCH: 23.3 pg — ABNORMAL LOW (ref 26.0–34.0)
MCHC: 28.2 g/dL — ABNORMAL LOW (ref 30.0–36.0)
MCV: 82.6 fL (ref 80.0–100.0)
Platelets: 253 10*3/uL (ref 150–400)
RBC: 5.46 MIL/uL (ref 4.22–5.81)
RDW: 16.3 % — ABNORMAL HIGH (ref 11.5–15.5)
WBC: 7.6 10*3/uL (ref 4.0–10.5)
nRBC: 0 % (ref 0.0–0.2)

## 2023-02-04 LAB — HEPATIC FUNCTION PANEL
ALT: 12 U/L (ref 0–44)
AST: 23 U/L (ref 15–41)
Albumin: 4.4 g/dL (ref 3.5–5.0)
Alkaline Phosphatase: 56 U/L (ref 38–126)
Bilirubin, Direct: 0.2 mg/dL (ref 0.0–0.2)
Indirect Bilirubin: 0.6 mg/dL (ref 0.3–0.9)
Total Bilirubin: 0.8 mg/dL (ref 0.3–1.2)
Total Protein: 7.1 g/dL (ref 6.5–8.1)

## 2023-02-04 LAB — LIPASE, BLOOD: Lipase: 30 U/L (ref 11–51)

## 2023-02-04 LAB — TROPONIN I (HIGH SENSITIVITY): Troponin I (High Sensitivity): 7 ng/L (ref <18)

## 2023-02-04 MED ORDER — PANTOPRAZOLE SODIUM 40 MG IV SOLR
40.0000 mg | Freq: Once | INTRAVENOUS | Status: AC
  Administered 2023-02-04: 40 mg via INTRAVENOUS
  Filled 2023-02-04: qty 10

## 2023-02-04 MED ORDER — GLUCAGON HCL RDNA (DIAGNOSTIC) 1 MG IJ SOLR
1.0000 mg | Freq: Once | INTRAMUSCULAR | Status: AC
  Administered 2023-02-04: 1 mg via INTRAVENOUS
  Filled 2023-02-04: qty 1

## 2023-02-04 NOTE — ED Triage Notes (Signed)
Ambulatory to triage. Walks with cane. NAD.  Reports central CP and between shoulder blades for several days now. Attributes to acid reflux.    HX recent back surgery, HTN, Afib-eliquis and hernia. No meds today due to not feeling well.

## 2023-02-04 NOTE — ED Notes (Signed)
Pt continuing to spit up after fluid challenge. Per Long MD pt to go to [facility] ED POV

## 2023-02-04 NOTE — ED Notes (Signed)
[facility] Dr. Lockie Mola accepting for transfer. Pt will be transported POV per Dr. Jacqulyn Bath. Report called to [employer], Consulting civil engineer at [employer].

## 2023-02-04 NOTE — ED Provider Notes (Signed)
Emergency Department Provider Note   I have reviewed the triage vital signs and the nursing notes.   HISTORY  Chief Complaint Chest Pain   HPI Brent Taylor is a 82 y.o. male with past history of A-fib on anticoagulation presents the emergency department with chest discomfort and inability to tolerate anything by mouth since yesterday at dinner.  He was out to dinner with his wife and ate beef tips.  Since dinner has felt more severe acid reflux discomfort in his chest.  He tried drinking fluids, eating, taking pills with near immediate regurgitation.  He is able to swallow his saliva temporarily but states after several minutes he has to spit that back up as well.  He tells me in [place] he did have esophageal dilation at least 2 times but nothing since moving to [place].  Denies shortness of breath, fever, abdominal pain.  He has not taken any of his medicines today due to this.  He tried eating grits this morning but again vomited. Presents to the ED today with worsening symptoms.    Past Medical History:  Diagnosis Date   Atrial fibrillation (HCC)    Cancer (HCC)    hx - melanoma   Chicken pox    Colon polyps    Coronary artery disease    Depression    No need for therapy.  Improved   Diabetes (HCC)    Diverticula, colon    Dyspnea    with exertion   GERD (gastroesophageal reflux disease)    Hemorrhoids    Hyperlipidemia    Hypertension    Sepsis (HCC)    Sepsis (HCC)    Sleep apnea    Tinnitus    UTI (lower urinary tract infection)     Review of Systems  Constitutional: No fever/chills Eyes: No visual changes. ENT: No sore throat. Cardiovascular: Positive chest pain. Respiratory: Denies shortness of breath. Gastrointestinal: Positive epigastric abdominal pain. Positive nausea and vomiting.  No diarrhea.  No constipation. Genitourinary: Negative for dysuria.  ____________________________________________   PHYSICAL EXAM:  VITAL SIGNS: ED Triage  Vitals  Enc Vitals Group     BP 02/04/23 2010 (!) 141/87     Pulse Rate 02/04/23 2010 84     Resp 02/04/23 2010 20     Temp 02/04/23 2010 (!) 97.5 F (36.4 C)     Temp Source 02/04/23 2010 Oral     SpO2 02/04/23 2010 100 %     Weight 02/04/23 2034 243 lb 13.3 oz (110.6 kg)     Height 02/04/23 2034 6' (1.829 m)   Constitutional: Alert and oriented. Well appearing and in no acute distress. Eyes: Conjunctivae are normal.  Head: Atraumatic. Nose: No congestion/rhinnorhea. Mouth/Throat: Mucous membranes are moist.  Neck: No stridor.   Cardiovascular: Normal rate, regular rhythm. Good peripheral circulation. Grossly normal heart sounds.   Respiratory: Normal respiratory effort.  No retractions. Lungs CTAB. Gastrointestinal: Soft and nontender. No distention.  Musculoskeletal: No lower extremity tenderness nor edema. No gross deformities of extremities. Neurologic:  Normal speech and language. No gross focal neurologic deficits are appreciated.  Skin:  Skin is warm, dry and intact. No rash noted.  ____________________________________________   LABS (all labs ordered are listed, but only abnormal results are displayed)  Labs Reviewed  BASIC METABOLIC PANEL - Abnormal; Notable for the following components:      Result Value   Glucose, Bld 123 (*)    All other components within normal limits  CBC - Abnormal; Notable  for the following components:   Hemoglobin 12.7 (*)    MCH 23.3 (*)    MCHC 28.2 (*)    RDW 16.3 (*)    All other components within normal limits  HEPATIC FUNCTION PANEL  LIPASE, BLOOD  TROPONIN I (HIGH SENSITIVITY)  TROPONIN I (HIGH SENSITIVITY)   ____________________________________________  EKG   EKG Interpretation  Date/Time:  Friday February 04 2023 20:14:54 EDT Ventricular Rate:  117 PR Interval:    QRS Duration: 102 QT Interval:  332 QTC Calculation: 463 R Axis:   -40 Text Interpretation: A fib with frequent PVC Left axis deviation Moderate voltage  criteria for LVH, may be normal variant ( R in aVL , Cornell product ) Anterior infarct , age undetermined Abnormal ECG When compared with ECG of 09-Jan-2022 18:03, PREVIOUS ECG IS PRESENT Confirmed by Alona Bene [phone]) on 02/04/2023 8:24:38 PM        ____________________________________________  RADIOLOGY  DG Chest Port 1 View  Result Date: 02/04/2023 CLINICAL DATA:  Central chest pain. EXAM: PORTABLE CHEST 1 VIEW COMPARISON:  01/09/2022. FINDINGS: The heart is enlarged and the mediastinal contour is within normal limits. A moderate-to-large hiatal hernia is present. Lung volumes are low. No consolidation, effusion, or pneumothorax. Surgical clips are present in the left axilla. No acute osseous abnormality. IMPRESSION: 1. No active disease. 2. Cardiomegaly. 3. Moderate-to-large hiatal hernia. Electronically Signed   By: Thornell Sartorius M.D.   On: 02/04/2023 20:51    ____________________________________________   PROCEDURES  Procedure(s) performed:   Procedures  None  ____________________________________________   INITIAL IMPRESSION / ASSESSMENT AND PLAN / ED COURSE  Pertinent labs & imaging results that were available during my care of the patient were reviewed by me and considered in my medical decision making (see chart for details).   This patient is Presenting for Evaluation of CP, which does require a range of treatment options, and is a complaint that involves a high risk of morbidity and mortality.  The Differential Diagnoses includes but is not exclusive to food impaction, acute coronary syndrome, aortic dissection, pulmonary embolism, cardiac tamponade, community-acquired pneumonia, pericarditis, musculoskeletal chest wall pain, etc.   Critical Interventions-    Medications  pantoprazole (PROTONIX) injection 40 mg (40 mg Intravenous Given 02/04/23 2040)  glucagon (human recombinant) (GLUCAGEN) injection 1 mg (1 mg Intravenous Given 02/04/23 2139)    Reassessment  after intervention: No improvement with Glucagon. Patient regurgitating water into emesis bag.    I did obtain Additional Historical Information from family at bedside.   I decided to review pertinent External Data, and in summary patient's PCP with [facility].   Clinical Laboratory Tests Ordered, included CBC with no leukocytosis.  Mild anemia to 12.7.  LFTs, bilirubin, lipase normal.  Troponin normal.  No acute kidney injury.  Radiologic Tests Ordered, included CXR. I independently interpreted the images and agree with radiology interpretation.   Cardiac Monitor Tracing which shows A fib with frequent PVCs.  Social Determinants of Health Risk patient is a non-smoker.   Consult complete with Dr. Orvan Falconer with GI. Plan to challenge with Glucagon and reassess.   10:27 PM  No improvement with Glucagon. Dr. Orvan Falconer will see patient in the [facility] ED and likely upper endoscopy. Dr. Mardene Speak accepts patient in ED-ED transfer to [facility].   Medical Decision Making: Summary:  Patient presents emergency department with chest discomfort along with nausea and vomiting.  Has been unable to tolerate anything by mouth since eating beef tips last night.  Initial labs  from triage include cardiac workup but seems more GI in nature.   Reevaluation with update and discussion with patient. He is not tolerating PO after glucagon. Will transfer to the [facility] ED for GI evaluation and upper endoscopy. Family at bedside. Patient is HDS. Awake. Alert. [facility] transportation is experiencing delay and I think it is reasonable to to private vehicle transport. Patient and family in agreement.  Cautioned to obey all traffic laws and to remain NPO. Will secure IV for transport.   Considered admission but plan for transfer for GI consultation.   Patient's presentation is most consistent with acute presentation with potential threat to life or bodily function.   Disposition: transfer.    ____________________________________________  FINAL CLINICAL IMPRESSION(S) / ED DIAGNOSES  Final diagnoses:  Esophageal obstruction due to food impaction  Atypical chest pain  Nausea and vomiting, unspecified vomiting type    Note:  This document was prepared using Dragon voice recognition software and may include unintentional dictation errors.  Alona Bene, MD, [facility] Emergency Medicine    Troyce Gieske, Arlyss Repress, MD 02/04/23 2242

## 2023-02-05 ENCOUNTER — Telehealth: Payer: Self-pay | Admitting: Nurse Practitioner

## 2023-02-05 ENCOUNTER — Encounter (HOSPITAL_COMMUNITY)
Admission: EM | Disposition: A | Discharge status: Home / Self Care | Payer: Self-pay | Source: Home / Self Care | Attending: Emergency Medicine

## 2023-02-05 ENCOUNTER — Emergency Department (EMERGENCY_DEPARTMENT_HOSPITAL): Payer: Medicare HMO | Admitting: Certified Registered Nurse Anesthetist

## 2023-02-05 ENCOUNTER — Encounter (HOSPITAL_COMMUNITY): Payer: Self-pay

## 2023-02-05 ENCOUNTER — Emergency Department (HOSPITAL_COMMUNITY): Payer: Medicare HMO | Admitting: Certified Registered Nurse Anesthetist

## 2023-02-05 DIAGNOSIS — E1149 Type 2 diabetes mellitus with other diabetic neurological complication: Secondary | ICD-10-CM

## 2023-02-05 DIAGNOSIS — D649 Anemia, unspecified: Secondary | ICD-10-CM | POA: Diagnosis not present

## 2023-02-05 DIAGNOSIS — G4733 Obstructive sleep apnea (adult) (pediatric): Secondary | ICD-10-CM | POA: Diagnosis not present

## 2023-02-05 DIAGNOSIS — Z9989 Dependence on other enabling machines and devices: Secondary | ICD-10-CM | POA: Diagnosis not present

## 2023-02-05 DIAGNOSIS — W44F3XA Food entering into or through a natural orifice, initial encounter: Secondary | ICD-10-CM | POA: Diagnosis not present

## 2023-02-05 DIAGNOSIS — I10 Essential (primary) hypertension: Secondary | ICD-10-CM | POA: Diagnosis not present

## 2023-02-05 DIAGNOSIS — T18128A Food in esophagus causing other injury, initial encounter: Secondary | ICD-10-CM

## 2023-02-05 DIAGNOSIS — Q399 Congenital malformation of esophagus, unspecified: Secondary | ICD-10-CM | POA: Diagnosis not present

## 2023-02-05 DIAGNOSIS — I2510 Atherosclerotic heart disease of native coronary artery without angina pectoris: Secondary | ICD-10-CM

## 2023-02-05 DIAGNOSIS — K3189 Other diseases of stomach and duodenum: Secondary | ICD-10-CM

## 2023-02-05 DIAGNOSIS — Z7901 Long term (current) use of anticoagulants: Secondary | ICD-10-CM | POA: Diagnosis not present

## 2023-02-05 HISTORY — PX: ESOPHAGOGASTRODUODENOSCOPY: SHX5428

## 2023-02-05 HISTORY — PX: FOREIGN BODY REMOVAL: SHX962

## 2023-02-05 LAB — GLUCOSE, CAPILLARY: Glucose-Capillary: 114 mg/dL — ABNORMAL HIGH (ref 70–99)

## 2023-02-05 SURGERY — EGD (ESOPHAGOGASTRODUODENOSCOPY)
Anesthesia: General

## 2023-02-05 MED ORDER — PHENYLEPHRINE 80 MCG/ML (10ML) SYRINGE FOR IV PUSH (FOR BLOOD PRESSURE SUPPORT)
PREFILLED_SYRINGE | INTRAVENOUS | Status: DC | PRN
  Administered 2023-02-05: 160 ug via INTRAVENOUS
  Administered 2023-02-05: 120 ug via INTRAVENOUS
  Administered 2023-02-05 (×3): 160 ug via INTRAVENOUS

## 2023-02-05 MED ORDER — PANTOPRAZOLE SODIUM 20 MG PO TBEC: 40.0000 mg | DELAYED_RELEASE_TABLET | Freq: Two times a day (BID) | ORAL | 3 refills | Status: DC

## 2023-02-05 MED ORDER — FENTANYL CITRATE (PF) 100 MCG/2ML IJ SOLN: 25.0000 ug | INTRAMUSCULAR | Status: DC | PRN

## 2023-02-05 MED ORDER — DEXAMETHASONE SODIUM PHOSPHATE 10 MG/ML IJ SOLN
INTRAMUSCULAR | Status: DC | PRN
  Administered 2023-02-05: 5 mg via INTRAVENOUS

## 2023-02-05 MED ORDER — SUCCINYLCHOLINE CHLORIDE 200 MG/10ML IV SOSY
PREFILLED_SYRINGE | INTRAVENOUS | Status: DC | PRN
  Administered 2023-02-05: 120 mg via INTRAVENOUS

## 2023-02-05 MED ORDER — PROPOFOL 10 MG/ML IV BOLUS
INTRAVENOUS | Status: DC | PRN
  Administered 2023-02-05: 140 mg via INTRAVENOUS

## 2023-02-05 MED ORDER — ALBUMIN HUMAN 5 % IV SOLN
12.5000 g | Freq: Once | INTRAVENOUS | Status: AC
  Administered 2023-02-05: 12.5 g via INTRAVENOUS

## 2023-02-05 MED ORDER — ONDANSETRON HCL 4 MG/2ML IJ SOLN: 4.0000 mg | Freq: Once | INTRAMUSCULAR | Status: DC | PRN

## 2023-02-05 MED ORDER — ONDANSETRON HCL 4 MG/2ML IJ SOLN
INTRAMUSCULAR | Status: DC | PRN
  Administered 2023-02-05: 4 mg via INTRAVENOUS

## 2023-02-05 MED ORDER — LIDOCAINE 2% (20 MG/ML) 5 ML SYRINGE
INTRAMUSCULAR | Status: DC | PRN
  Administered 2023-02-05: 100 mg via INTRAVENOUS

## 2023-02-05 MED ORDER — LACTATED RINGERS IV SOLN: INTRAVENOUS | Status: DC | PRN

## 2023-02-05 MED ORDER — ONDANSETRON HCL 4 MG/2ML IJ SOLN
INTRAMUSCULAR | Status: AC
  Filled 2023-02-05: qty 2

## 2023-02-05 MED ORDER — ALBUMIN HUMAN 5 % IV SOLN
INTRAVENOUS | Status: AC
  Filled 2023-02-05: qty 250

## 2023-02-05 NOTE — Op Note (Signed)
[facility] Patient Name: Brent Taylor Procedure Date : 02/05/2023 MRN: [id number] Attending MD: Tressia Danas MD, MD, [id number] Date of Birth: 1941/09/23 CSN: [id number] Age: 82 Admit Type: Inpatient Procedure:                Upper GI endoscopy Indications:              Foreign body in the esophagus Providers:                Tressia Danas MD, MD, Lorenza Evangelist, RN,                            Rozetta Nunnery, Technician Referring MD:              Medicines:                General Anesthesia Complications:            No immediate complications. Estimated Blood Loss:     Estimated blood loss: none. Procedure:                Pre-Anesthesia Assessment:                           - Prior to the procedure, a History and Physical                            was performed, and patient medications and                            allergies were reviewed. The patient's tolerance of                            previous anesthesia was also reviewed. The risks                            and benefits of the procedure and the sedation                            options and risks were discussed with the patient.                            All questions were answered, and informed consent                            was obtained. Prior Anticoagulants: The patient has                            taken Eliquis (apixaban), last dose was 1 day prior                            to procedure. ASA Grade Assessment: III - A patient                            with severe systemic disease. After reviewing the  risks and benefits, the patient was deemed in                            satisfactory condition to undergo the procedure.                           After obtaining informed consent, the endoscope was                            passed under direct vision. Throughout the                            procedure, the patient's blood pressure, pulse, and                             oxygen saturations were monitored continuously. The                            [id number]-[id number] ([id number]) Olympus endoscope was introduced                            through the mouth, and advanced to the second part                            of duodenum. The upper GI endoscopy was                            accomplished without difficulty. The patient                            tolerated the procedure well. Scope In: Scope Out: Findings:      The examined esophagus was tortuous, particularly in the most distal       esophagus. The food bolus present in the distal esophagus was advanced       into the stomach with gentle pressure. The underlying mucosa showed mild       erythema. No obvious stricture.      The entire examined stomach was normal.      A single medium nodule was found in the duodenal bulb. The nodule was       Paris classification Is (protruding, sessile) and appears to be       submucosal. Tunneled biopsies were not obtained. The remainder of the       examined gastric mucosa was normal. Impression:               - Tortuous esophagus.                           - Food in the distal esophagus.                           - Nodule found in the duodenum. Recommendation:           - Patient has a contact number available for  emergencies. The signs and symptoms of potential                            delayed complications were discussed with the                            patient. Return to normal activities tomorrow.                            Written discharge instructions were provided to the                            patient.                           - Resume previous diet. Eat slowly. Take small                            bites. Chew well. Take sips of water between bites.                           - Continue present medications.                           - Increase pantoprazole to 40 mg BID. I sent an                            updated  prescription to [facility] on [address].                           - Follow-up at [facility] to discuss timing of                            repeat EGD for empiric dilation and evaluation of                            the duodenal nodule.                           - The findings and recommendations were discussed                            with the patient's family (wife and daughter). Procedure Code(s):        --- Professional ---                           [phone], Esophagogastroduodenoscopy, flexible,                            transoral; diagnostic, including collection of  specimen(s) by brushing or washing, when performed                            (separate procedure) Diagnosis Code(s):        --- Professional ---                           Q39.9, Congenital malformation of esophagus,                            unspecified                           T18.128A, Food in esophagus causing other injury,                            initial encounter                           K31.89, Other diseases of stomach and duodenum                           T18.108A, Unspecified foreign body in esophagus                            causing other injury, initial encounter CPT copyright 2022 American Medical Association. All rights reserved. The codes documented in this report are preliminary and upon coder review may  be revised to meet current compliance requirements. Tressia Danas MD, MD 02/05/2023 2:05:29 AM This report has been signed electronically. Number of Addenda: 0

## 2023-02-05 NOTE — Anesthesia Procedure Notes (Signed)
Procedure Name: Intubation Date/Time: 02/05/2023 1:38 AM  Performed by: Tressia Miners, CRNAPre-anesthesia Checklist: Patient identified, Emergency Drugs available, Suction available and Patient being monitored Patient Re-evaluated:Patient Re-evaluated prior to induction Oxygen Delivery Method: Circle System Utilized Preoxygenation: Pre-oxygenation with 100% oxygen Induction Type: IV induction, Rapid sequence and Cricoid Pressure applied Laryngoscope Size: Mac and 4 Grade View: Grade I Tube type: Oral Number of attempts: 1 Airway Equipment and Method: Stylet Placement Confirmation: ETT inserted through vocal cords under direct vision, positive ETCO2 and breath sounds checked- equal and bilateral Secured at: 23 cm Tube secured with: Tape Dental Injury: Teeth and Oropharynx as per pre-operative assessment

## 2023-02-05 NOTE — Discharge Instructions (Signed)
Take small bites. Chew well. Eat slowly. Takes sips of water between bites.   I increased your pantoprazole to 40 mg twice daily.  You will need to have another endoscopy off Eliquis to have your esophagus dilated and to evaluate the nodule in the small intestines. I will arrange an office visit for you at [facility] to make those plans.

## 2023-02-05 NOTE — Transfer of Care (Signed)
Immediate Anesthesia Transfer of Care Note  Patient: Brent Taylor  Procedure(s) Performed: ESOPHAGOGASTRODUODENOSCOPY (EGD) FOREIGN BODY REMOVAL  Patient Location: PACU  Anesthesia Type:General  Level of Consciousness: awake and alert   Airway & Oxygen Therapy: Patient Spontanous Breathing and Patient connected to nasal cannula oxygen  Post-op Assessment: Report given to RN and Post -op Vital signs reviewed and stable  Post vital signs: Reviewed and stable  Last Vitals:  Vitals Value Taken Time  BP 151/80 02/05/23 0203  Temp    Pulse 95 02/05/23 0208  Resp 25 02/05/23 0208  SpO2 91 % 02/05/23 0208  Vitals shown include unvalidated device data.  Last Pain:  Vitals:   02/05/23 0122  TempSrc: Temporal  PainSc: 0-No pain         Complications: No notable events documented.

## 2023-02-05 NOTE — ED Notes (Signed)
Patient attempted to swallow water , unsucessful

## 2023-02-05 NOTE — Consult Note (Signed)
Referring Provider: No ref. provider found Primary Care Physician:  [name], MD   Reason for Consultation:  Food Impaction   IMPRESSION:  Food impaction in the setting of longstanding reflux and prior esophageal dilation for presumed assume stricture  Chronic anticoagulation on Eliquis last dose 02/04/2023  PLAN: EGD with removal of food impaction  Will proceed with endoscopy. Given advanced age, comorbidities, and Eliquis, the procedure is high risk. The nature of the procedure, as well as the risks including increased risk of bleeding with recent Eliquis use, benefits, and alternatives were carefully and thoroughly reviewed with the patient.  The patient's wife and daughter were present at the time of our discussion.  He agreed to proceed with endoscopy acknowledging the risks.  HPI: Brent Taylor is a 82 y.o. male who presents to the emergency room with a food impaction.  He has a longstanding history of reflux controlled with PPI therapy.  He is required prior esophageal dilation last over 20 years ago.  He ate beef tips approximately 32 hours ago.  He has had difficulty tolerating his own secretions and has been able to eat anything further since that time.  He tried eating grits this morning but again vomited.  He has not been able to take his medications successfully since eating the beef tips.  He developed chest pain associated with the food impaction.  Cardiac evaluation in the ED was negative.  No change in symptoms with glucagon.  Had an ER visit earlier this month for head injury after mechanical fall.  Head, maxillofacial, and cervical spine CT scans at that time showed no acute process.  A facial laceration was repaired.   Past Medical History:  Diagnosis Date   Atrial fibrillation (HCC)    Cancer (HCC)    hx - melanoma   Chicken pox    Colon polyps    Coronary artery disease    Depression    No need for therapy.  Improved   Diabetes (HCC)    Diverticula,  colon    Dyspnea    with exertion   GERD (gastroesophageal reflux disease)    Hemorrhoids    Hyperlipidemia    Hypertension    Sepsis (HCC)    Sepsis (HCC)    Sleep apnea    Tinnitus    UTI (lower urinary tract infection)     Past Surgical History:  Procedure Laterality Date   CARDIAC CATHETERIZATION     LUMBAR LAMINECTOMY/DECOMPRESSION MICRODISCECTOMY N/A 11/08/2022   Procedure: LUMBAR FOUR FIVE DECOMPRESSION AND IN SITU FUSION;  Surgeon: Venita Lick, MD;  Location: [facility] OR;  Service: Orthopedics;  Laterality: N/A;    MELANOMA EXCISION     LEFT CHEST   MELANOMA EXCISION      Prior to Admission medications   Medication Sig Start Date End Date Taking? Authorizing Provider  acetaminophen (TYLENOL) 650 MG CR tablet Take 1,300 mg by mouth daily as needed for pain.   Yes [provider]  Cyanocobalamin (B-12 PO) Take 1 tablet by mouth daily.   Yes [provider]  ELIQUIS 5 MG TABS tablet TAKE 1 TABLET BY MOUTH TWICE A DAY 11/09/22  Yes [name], [name], MD  FLUoxetine (PROZAC) 40 MG capsule TAKE ONE CAPSULE BY MOUTH DAILY 07/12/22  Yes [name], MD  fluticasone [facility]) 50 MCG/ACT nasal spray Place 1 spray into both nostrils daily as needed for allergies or rhinitis.   Yes [provider]  losartan (COZAAR) 50 MG tablet  TAKE 1 TABLET BY MOUTH DAILY 01/17/23  Yes [name], MD  MAGNESIUM PO Take 1 tablet by mouth daily.   Yes [provider]  metoprolol tartrate (LOPRESSOR) 25 MG tablet TAKE 1/2 TABLET BY MOUTH TWICE A DAY Patient taking differently: Take 12.5 mg by mouth 2 (two) times daily. 10/05/22  Yes [name], [name], MD  Omega-3 Fatty Acids (FISH OIL PO) Take 1 capsule by mouth daily.   Yes [provider]  pantoprazole (PROTONIX) 20 MG tablet TAKE ONE TABLET BY MOUTH DAILY 05/10/22  Yes [name], MD  pravastatin (PRAVACHOL) 80 MG tablet Take 1 tablet (80 mg total) by mouth every evening. 12/15/22   Yes [name], [name], MD  traMADol (ULTRAM) 50 MG tablet Take 50 mg by mouth daily as needed for moderate pain or severe pain. 11/18/22  Yes [provider]  Ascorbic Acid (VITAMIN C PO) Take by mouth.    [provider]  Blood Glucose Monitoring Suppl (ONE TOUCH ULTRA 2) w/Device KIT Use daily to check blood sugar. 10/13/20   [name], MD  Lancets [facility] ULTRASOFT) lancets USE TO TEST BLOOD SUGAR DAILY 02/01/23   [name], MD  [facility] ULTRA test strip USE TO TEST BLOOD SUGAR DAILY AS DIRECTED 12/31/21   [name], MD    No current facility-administered medications for this encounter.   Current Outpatient Medications  Medication Sig Dispense Refill   acetaminophen (TYLENOL) 650 MG CR tablet Take 1,300 mg by mouth daily as needed for pain.     Cyanocobalamin (B-12 PO) Take 1 tablet by mouth daily.     ELIQUIS 5 MG TABS tablet TAKE 1 TABLET BY MOUTH TWICE A DAY 60 tablet 5   FLUoxetine (PROZAC) 40 MG capsule TAKE ONE CAPSULE BY MOUTH DAILY 90 capsule 3   fluticasone (FLONASE) 50 MCG/ACT nasal spray Place 1 spray into both nostrils daily as needed for allergies or rhinitis.     losartan (COZAAR) 50 MG tablet TAKE 1 TABLET BY MOUTH DAILY 90 tablet 0   MAGNESIUM PO Take 1 tablet by mouth daily.     metoprolol tartrate (LOPRESSOR) 25 MG tablet TAKE 1/2 TABLET BY MOUTH TWICE A DAY (Patient taking differently: Take 12.5 mg by mouth 2 (two) times daily.) 90 tablet 3   Omega-3 Fatty Acids (FISH OIL PO) Take 1 capsule by mouth daily.     pantoprazole (PROTONIX) 20 MG tablet TAKE ONE TABLET BY MOUTH DAILY 90 tablet 3   pravastatin (PRAVACHOL) 80 MG tablet Take 1 tablet (80 mg total) by mouth every evening. 90 tablet 3   traMADol (ULTRAM) 50 MG tablet Take 50 mg by mouth daily as needed for moderate pain or severe pain.     Ascorbic Acid (VITAMIN C PO) Take by mouth.     Blood Glucose Monitoring Suppl (ONE TOUCH ULTRA 2) w/Device KIT Use daily to check blood  sugar. 1 kit 0   Lancets (ONETOUCH ULTRASOFT) lancets USE TO TEST BLOOD SUGAR DAILY 100 each 12   ONETOUCH ULTRA test strip USE TO TEST BLOOD SUGAR DAILY AS DIRECTED 100 strip 3    Allergies as of 02/04/2023 - Review Complete 02/04/2023  Allergen Reaction Noted   Lipitor [atorvastatin calcium]  12/10/2011    Family History  Problem Relation Age of Onset   Heart Problems Mother        Skipping      Physical Exam: General:   Alert,  well-nourished, pleasant and cooperative in  NAD Head:  Normocephalic and atraumatic.  Ecchymoses under both eyes. Eyes:  Sclera clear, no icterus.   Conjunctiva pink. Ears:  Normal auditory acuity. Nose:  No deformity, discharge,  or lesions. Mouth:  No deformity or lesions.   Neck:  Supple; no masses or thyromegaly. Lungs:  Clear throughout to auscultation.   No wheezes. Heart:  Regular rate and rhythm; no murmurs. Abdomen:  Soft, nontender, nondistended, normal bowel sounds, no rebound or guarding. No hepatosplenomegaly.   Rectal:  Deferred  Msk:  Symmetrical. No boney deformities LAD: No inguinal or umbilical LAD Extremities:  No clubbing or edema. Neurologic:  Alert and  oriented x4;  grossly nonfocal Skin:  Intact without significant lesions or rashes. Psych:  Alert and cooperative. Normal mood and affect.   Lab Results: Recent Labs    02/04/23 2028  WBC 7.6  HGB 12.7*  HCT 45.1  PLT 253   BMET Recent Labs    02/04/23 2028  NA 142  K 4.3  CL 107  CO2 23  GLUCOSE 123*  BUN 20  CREATININE 1.07  CALCIUM 9.3   LFT Recent Labs    02/04/23 2028  PROT 7.1  ALBUMIN 4.4  AST 23  ALT 12  ALKPHOS 56  BILITOT 0.8  BILIDIR 0.2  IBILI 0.6     Studies/Results: DG Chest Port 1 View  Result Date: 02/04/2023 CLINICAL DATA:  Central chest pain. EXAM: PORTABLE CHEST 1 VIEW COMPARISON:  01/09/2022. FINDINGS: The heart is enlarged and the mediastinal contour is within normal limits. A moderate-to-large hiatal hernia is present.  Lung volumes are low. No consolidation, effusion, or pneumothorax. Surgical clips are present in the left axilla. No acute osseous abnormality. IMPRESSION: 1. No active disease. 2. Cardiomegaly. 3. Moderate-to-large hiatal hernia. Electronically Signed   By: Thornell Sartorius M.D.   On: 02/04/2023 20:51      Jaskarn Schweer L. Orvan Falconer, MD, MPH 02/05/2023, 12:42 AM

## 2023-02-05 NOTE — ED Notes (Signed)
Pt given water for fluid challenge, not tolerated well. Pt spitting up and and endorsing not being able to drink anymore after a couple of sips.

## 2023-02-05 NOTE — Anesthesia Preprocedure Evaluation (Signed)
Anesthesia Evaluation  Patient identified by MRN, date of birth, ID band Patient awake    Reviewed: Allergy & Precautions, NPO status , Patient's Chart, lab work & pertinent test results, reviewed documented beta blocker date and time   Airway Mallampati: III  TM Distance: >3 FB Neck ROM: Full    Dental  (+) Teeth Intact, Dental Advisory Given   Pulmonary sleep apnea and Continuous Positive Airway Pressure Ventilation    Pulmonary exam normal breath sounds clear to auscultation       Cardiovascular hypertension, Pt. on medications and Pt. on home beta blockers + CAD  + dysrhythmias Atrial Fibrillation  Rhythm:Irregular Rate:Abnormal     Neuro/Psych  PSYCHIATRIC DISORDERS  Depression     Neuromuscular disease    GI/Hepatic Neg liver ROS,GERD  Medicated,,Food Impaction   Endo/Other  diabetes  Obesity   Renal/GU negative Renal ROS     Musculoskeletal  (+) Arthritis ,    Abdominal   Peds  Hematology  (+) Blood dyscrasia (Eliquis), anemia   Anesthesia Other Findings Day of surgery medications reviewed with the patient.  Reproductive/Obstetrics                             Anesthesia Physical Anesthesia Plan  ASA: 3 and emergent  Anesthesia Plan: General   Post-op Pain Management: Minimal or no pain anticipated   Induction: Intravenous, Rapid sequence and Cricoid pressure planned  PONV Risk Score and Plan: 2 and Ondansetron and Dexamethasone  Airway Management Planned: Oral ETT  Additional Equipment:   Intra-op Plan:   Post-operative Plan: Extubation in OR  Informed Consent: I have reviewed the patients History and Physical, chart, labs and discussed the procedure including the risks, benefits and alternatives for the proposed anesthesia with the patient or authorized representative who has indicated his/her understanding and acceptance.     Dental advisory given  Plan  Discussed with: CRNA  Anesthesia Plan Comments:        Anesthesia Quick Evaluation

## 2023-02-05 NOTE — Telephone Encounter (Signed)
Mr. Gallicchio called answering service.  He had an EGD done early this am for a food impaction. He was intubated for the procedure. At home he coughed up a small amount of blood. Since then he has seen some  "specks" of blood when coughing.  He feels okay but takes Eliquis and was concerned about the bleeding  EGD - Tortuous esophagus.  - Food in the distal esophagus.  - Nodule found in the duodenum.  Suspect irritation post intubation. Recommended that he monitor closely for now. If symptoms persists / increase then needs to call us back.

## 2023-02-06 NOTE — Anesthesia Postprocedure Evaluation (Signed)
Anesthesia Post Note  Patient:  Otter  Procedure(s) Performed: ESOPHAGOGASTRODUODENOSCOPY (EGD) FOREIGN BODY REMOVAL     Patient location during evaluation: PACU Anesthesia Type: General Level of consciousness: awake and alert Pain management: pain level controlled Vital Signs Assessment: post-procedure vital signs reviewed and stable Respiratory status: spontaneous breathing, nonlabored ventilation, respiratory function stable and patient connected to nasal cannula oxygen Cardiovascular status: blood pressure returned to baseline and stable Postop Assessment: no apparent nausea or vomiting Anesthetic complications: no   No notable events documented.  Last Vitals:  Vitals:   02/05/23 0300 02/05/23 0315  BP: 112/71 114/68  Pulse: 88 (!) 106  Resp: 16 16  Temp: 36.6 C   SpO2: 97% 94%    Last Pain:  Vitals:   02/05/23 0215  TempSrc:   PainSc: 0-No pain                 Collene Schlichter

## 2023-02-07 ENCOUNTER — Encounter (HOSPITAL_COMMUNITY): Payer: Self-pay | Admitting: Gastroenterology

## 2023-02-07 NOTE — Telephone Encounter (Signed)
Hold Eliquis for 2 days Clear liquid diet today then advance to soft foods If bleeding persists then to ED for evaluation Dr. Marina Goodell is his gastroenterologist so follow up with him

## 2023-02-07 NOTE — Telephone Encounter (Signed)
PT is still spitting up blood after having EGD on 4/27. He is concerned on what he should do. He is also asking was there an appointment to be made to discuss esophagus  stretching. Please advise.

## 2023-02-07 NOTE — Telephone Encounter (Addendum)
DOD Patient seen in the hospital by Dr Orvan Falconer. He had an EGD 02/05/23. Spoke with the patient. He has "coughed up small amount of rosy red phlegm about 5 times today." Describes what he is seeing as looking like "a little piece of raw meat." Reports he is pain free, breathing normally and "feel fine." Confirmed he is taking pantoprazole 40 mg BID.  Please advise.

## 2023-02-07 NOTE — Telephone Encounter (Signed)
Spoke with the patient and explained the plan. Patient agrees to this plan. Follow up with Dr Marina Goodell 02/24/23 at 10:20 am. Hold Eliquis starting this evening and resume 02/09/23 for the evening dose. Explained the diet. No questions at the end of the call.

## 2023-02-07 NOTE — Telephone Encounter (Signed)
Looks like he went to the ED for this and hsa seen GI. Please check in on patient and have him schedule an appointment here for follow up if needed.  Katina Degree. Jimmey Ralph, MD 02/07/2023 3:12 PM

## 2023-02-09 NOTE — Telephone Encounter (Signed)
Spoke with patient, stated he spoke with his GI and took him off Eliquis for a few day. Has a F/U visit with GI on 02/24/2023

## 2023-02-10 ENCOUNTER — Telehealth: Payer: Self-pay

## 2023-02-10 NOTE — Telephone Encounter (Signed)
Transition Care Management Follow-up Telephone Call Date of discharge and from where: 02/05/2023, [facility] How have you been since you were released from the hospital? Patient is feeling better. Any questions or concerns? No  Items Reviewed: Did the pt receive and understand the discharge instructions provided? Yes  Medications obtained and verified? Yes  Other? No  Any new allergies since your discharge? No  Dietary orders reviewed? Yes Do you have support at home? Yes   Follow up appointments reviewed:  PCP Hospital f/u appt confirmed? No  Scheduled to see  on  @ . Specialist Hospital f/u appt confirmed? Yes  Scheduled to see Yancey Flemings MD on 02/24/2023 @ 10:20 am [facility]. Are transportation arrangements needed? No  If their condition worsens, is the pt aware to call PCP or go to the Emergency Dept.? Yes Was the patient provided with contact information for the PCP's office or ED? Yes Was to pt encouraged to call back with questions or concerns? Yes  [facility]  [facility] Population Health Community Resource Care Guide   ??[email or web address]  ?? [phone]   Website: [email or web address]  [email or web address]

## 2023-02-16 ENCOUNTER — Encounter (INDEPENDENT_AMBULATORY_CARE_PROVIDER_SITE_OTHER): Payer: Medicare HMO | Admitting: Neurology

## 2023-02-16 DIAGNOSIS — G6289 Other specified polyneuropathies: Secondary | ICD-10-CM

## 2023-02-17 ENCOUNTER — Encounter: Payer: Self-pay | Admitting: Family Medicine

## 2023-02-18 NOTE — Telephone Encounter (Signed)
Based on what I can tell it looks like he is having a pinched nerve in his lower back that is causing his symptoms.  From what I can see there was no other obvious cause for his nerve damage.  It does take some time after back surgery for this to fully heal however there are some medications that may help some with the pain.  Recommend he follow back up with orthopedics to discuss this or he can schedule appointment.  Discussed medications if he wishes.

## 2023-02-18 NOTE — Telephone Encounter (Signed)
Please advise 

## 2023-02-23 ENCOUNTER — Telehealth (HOSPITAL_COMMUNITY): Payer: Self-pay | Admitting: Pharmacy Technician

## 2023-02-23 ENCOUNTER — Other Ambulatory Visit (HOSPITAL_COMMUNITY): Payer: Self-pay

## 2023-02-23 MED ORDER — PREGABALIN 75 MG PO CAPS: 75.0000 mg | ORAL_CAPSULE | Freq: Three times a day (TID) | ORAL | 5 refills | Status: DC | PRN

## 2023-02-23 NOTE — Telephone Encounter (Signed)
Patient Advocate Encounter  Prior Authorization for Pregabalin 75MG  capsules has been approved.    PA# [id number] Key: [employer] Medicare Electronic PA Form Effective dates: 02/23/2023 through 10/11/2023

## 2023-02-23 NOTE — Telephone Encounter (Signed)
I have called patient, he complains of bilatral feet paresthesia, especially at night. Tried gabapentin in the past without helping.  Lyrica 75mg  up to 3 tabs qhs prn  Meds ordered this encounter  Medications   pregabalin (LYRICA) 75 MG capsule    Sig: Take 1 capsule (75 mg total) by mouth 3 (three) times daily as needed.    Dispense:  90 capsule    Refill:  5     Please see the MyChart message reply(ies) for my assessment and plan.    This patient gave consent for this Medical Advice Message and is aware that it may result in a bill to [employer], as well as the possibility of receiving a bill for a co-payment or deductible. They are an established patient, but are not seeking medical advice exclusively about a problem treated during an in person or video visit in the last seven days. I did not recommend an in person or video visit within seven days of my reply.    I spent a total of 7 minutes cumulative time within 7 days through [employer].  Levert Feinstein, MD

## 2023-02-24 ENCOUNTER — Encounter: Payer: Self-pay | Admitting: Internal Medicine

## 2023-02-24 ENCOUNTER — Telehealth (HOSPITAL_COMMUNITY): Payer: Self-pay | Admitting: *Deleted

## 2023-02-24 ENCOUNTER — Ambulatory Visit: Payer: Medicare HMO | Admitting: Internal Medicine

## 2023-02-24 ENCOUNTER — Telehealth: Payer: Self-pay

## 2023-02-24 VITALS — BP 112/64 | HR 88 | Ht 69.0 in | Wt 238.4 lb

## 2023-02-24 DIAGNOSIS — K219 Gastro-esophageal reflux disease without esophagitis: Secondary | ICD-10-CM

## 2023-02-24 DIAGNOSIS — R1310 Dysphagia, unspecified: Secondary | ICD-10-CM

## 2023-02-24 DIAGNOSIS — K3189 Other diseases of stomach and duodenum: Secondary | ICD-10-CM | POA: Diagnosis not present

## 2023-02-24 DIAGNOSIS — Z7901 Long term (current) use of anticoagulants: Secondary | ICD-10-CM

## 2023-02-24 NOTE — Patient Instructions (Signed)
You have been scheduled for an endoscopy. Please follow written instructions given to you at your visit today. If you use inhalers (even only as needed), please bring them with you on the day of your procedure.  _______________________________________________________  If your blood pressure at your visit was 140/90 or greater, please contact your primary care physician to follow up on this.  _______________________________________________________  If you are age 97 or older, your body mass index should be between 23-30. Your Body mass index is 35.2 kg/m. If this is out of the aforementioned range listed, please consider follow up with your Primary Care Provider.  If you are age 52 or younger, your body mass index should be between 19-25. Your Body mass index is 35.2 kg/m. If this is out of the aformentioned range listed, please consider follow up with your Primary Care Provider.   ________________________________________________________  The [facility] providers would like to encourage you to use [facility] to communicate with providers for non-urgent requests or questions.  Due to long hold times on the telephone, sending your provider a message by [facility] may be a faster and more efficient way to get a response.  Please allow 48 business hours for a response.  Please remember that this is for non-urgent requests.  _______________________________________________________

## 2023-02-24 NOTE — Progress Notes (Signed)
HISTORY OF PRESENT ILLNESS:  Brent Taylor is a 82 y.o. male with multiple medical problems including chronic atrial fibrillation for which she is on Eliquis therapy.  He presents today after recent food impaction necessitating emergency room visit urgent endoscopy with Dr. Orvan Falconer.  Found to have food in the esophagus without obvious stricture.  Not dilated.  Incidental submucosal duodenal nodule.  His pantoprazole was increased from 20 mg daily to 40 mg twice daily.  He was sent to follow-up in this office for further evaluation strong consideration of endoscopy with esophageal dilation.  He is accompanied today by his wife.  I last saw the patient 2017.  At that dictation.  Did have a history of needing esophageal dilation intermittently for issues with dysphagia and transient food impactions as well as food impactions.  This was his work was done in [place].  Patient tells me that he has intermittent solid food dysphagia, not infrequently.  No problems with liquids.  No active reflux symptoms on PPI.  He has been on Eliquis for about 2 years for atrial fibrillation.  He has come off of Eliquis several times for procedures such as epidural injections and neck surgery.  His ejection fraction on his echo is 55%.  Review of blood work from April 2024 shows normal comprehensive metabolic panel save for elevated glucose.  CBC with hemoglobin 12.7.  Aforementioned endoscopy with Dr. Orvan Falconer was performed February 05, 2023.  REVIEW OF SYSTEMS:  All non-GI ROS negative unless otherwise stated in the HPI except for back pain, irregular heartbeat, muscle cramps, fatigue, sleeping problems, medical swelling, shortness of breath  Past Medical History:  Diagnosis Date   Atrial fibrillation (HCC)    Cancer (HCC)    hx - melanoma   Chicken pox    Colon polyps    Coronary artery disease    Depression    No need for therapy.  Improved   Diabetes (HCC)    Diverticula, colon    Duodenal nodule    Dyspnea    with  exertion   GERD (gastroesophageal reflux disease)    Hemorrhoids    Hyperlipidemia    Hypertension    Sepsis (HCC)    Sepsis (HCC)    Sleep apnea    Tinnitus    UTI (lower urinary tract infection)     Past Surgical History:  Procedure Laterality Date   CARDIAC CATHETERIZATION     ESOPHAGOGASTRODUODENOSCOPY N/A 02/05/2023   Procedure: ESOPHAGOGASTRODUODENOSCOPY (EGD);  Surgeon: Tressia Danas, MD;  Location: [facility];  Service: Gastroenterology;  Laterality: N/A;   FOREIGN BODY REMOVAL  02/05/2023   Procedure: FOREIGN BODY REMOVAL;  Surgeon: Tressia Danas, MD;  Location: [facility] ENDOSCOPY;  Service: Gastroenterology;;   LUMBAR LAMINECTOMY/DECOMPRESSION MICRODISCECTOMY N/A 11/08/2022   Procedure: LUMBAR FOUR FIVE DECOMPRESSION AND IN SITU FUSION;  Surgeon: Venita Lick, MD;  Location: [facility] OR;  Service: Orthopedics;  Laterality: N/A;    MELANOMA EXCISION     LEFT CHEST   MELANOMA EXCISION      Social History Brent Taylor  reports that he has never smoked. He has never used smokeless tobacco. He reports that he does not drink alcohol and does not use drugs.  family history includes Heart Problems in his mother.  Allergies  Allergen Reactions   Gabapentin Other (See Comments)    Lethargic and depressed, aching   Lipitor [Atorvastatin Calcium]     Muscle ache       PHYSICAL EXAMINATION: Vital signs: BP 112/64 (BP Location: Left  Arm, Patient Position: Sitting, Cuff Size: Normal)   Pulse 88   Ht 5\' 9"  (1.753 m) Comment: height measured without shoes  Wt 238 lb 6 oz (108.1 kg)   BMI 35.20 kg/m   Constitutional: generally well-appearing, no acute distress Psychiatric: alert and oriented x3, cooperative Eyes: extraocular movements intact, anicteric, conjunctiva pink Mouth: oral pharynx moist, no lesions Neck: supple no lymphadenopathy Cardiovascular: Irregularly irregular rate and rhythm, no murmur Lungs: clear to auscultation bilaterally Abdomen: soft,  nontender, nondistended, no obvious ascites, no peritoneal signs, normal bowel sounds, no organomegaly Rectal: Omitted Extremities: no clubbing, cyanosis, or lower extremity edema bilaterally Skin: no lesions on visible extremities Neuro: No focal deficits.  Renal nerves intact  ASSESSMENT:  1.  Intermittent solid food dysphagia with recent food impaction requiring emergency room visit and urgent endoscopy.  He has a history of esophageal stricture requiring dilation.  Not dilated at the time of most recent endoscopy. 2.  Chronic GERD.  On PPI. 3.  Multiple medical problems including chronic atrial fibrillation on Eliquis.  He is in atrial fibrillation today with controlled rate and no symptoms. 4.  Submucosal duodenal nodule.  Incidental.  Will require no further attention in this 82 year old with multiple comorbidities  PLAN:  1.  Reflux precautions 2.  Decrease pantoprazole to 40 mg once daily 3.  Schedule upper endoscopy with esophageal dilation.  The patient is HIGH RISK given his comorbidities, age, and the need to interrupt anticoagulation therapy.The nature of the procedure, as well as the risks, benefits, and alternatives were carefully and thoroughly reviewed with the patient. Ample time for discussion and questions allowed. The patient understood, was satisfied, and agreed to proceed. 4.  We will reach out to his cardiologist regarding holding his Eliquis for 2 days prior to the procedure.  Would anticipate resumption of anticoagulation either that same day or the next day following endoscopy. 5.  Ongoing general medical care with Dr. Jimmey Ralph

## 2023-02-24 NOTE — Telephone Encounter (Addendum)
Patient with diagnosis of afib on Eliquis for anticoagulation.    Procedure: endoscopy Date of procedure: 03/17/23  CHA2DS2-VASc Score = 5  This indicates a 7.2% annual risk of stroke. The patient's score is based upon: CHF History: 0 HTN History: 1 Diabetes History: 1 Stroke History: 0 Vascular Disease History: 1 Age Score: 2 Gender Score: 0   CrCl 59mL/min using adj body weight Platelet count 253K  Per office protocol, patient can hold Eliquis for 2 days prior to procedure as requested.    **This guidance is not considered finalized until pre-operative APP has relayed final recommendations.**

## 2023-02-24 NOTE — Telephone Encounter (Signed)
[facility] Pre-operative Risk Assessment     Request for surgical clearance:     Endoscopy Procedure  What type of surgery is being performed?     endoscopy  When is this surgery scheduled?     03/17/2023  What type of clearance is required ?   Pharmacy  Are there any medications that need to be held prior to surgery and how long? Eliquis - 2 days  Practice name and name of physician performing surgery?      [facility]  What is your office phone and fax number?      Phone- [phone]  Fax- [phone]  Anesthesia type (None, local, MAC, general) ?       MAC

## 2023-02-24 NOTE — Telephone Encounter (Signed)
Pt states he has been in afib since yesterday morning. HRs 100-110 fatigued but otherwise without symptom. Pt has not taken any PRN medication. Pt has PRN cardizem 30mg  he can take for elevated rates. Instructed pt to use PRN cardizem 30mg  every 4 hours as needed for elevated rates. If still in AF tomorrow will call back for assessment next week. Pt in agreement.

## 2023-02-25 NOTE — Telephone Encounter (Signed)
Left message to call back to set up tele pre op appt.  

## 2023-02-25 NOTE — Telephone Encounter (Signed)
Primary Cardiologist:[name] [name], MD   Preoperative team, please contact this patient and set up a phone call appointment for further preoperative risk assessment. Please obtain consent and complete medication review. Thank you for your help.   I confirm that guidance regarding antiplatelet and oral anticoagulation therapy has been completed and, if necessary, noted below.   Levi Aland, NP-C  02/25/2023, 8:22 AM [address], [address] Office [phone] Fax [phone]

## 2023-02-28 NOTE — Telephone Encounter (Addendum)
Called and left a detailed message for the patient to give our office a call back to get scheduled for a telephone visit for clearance for his upcoming 03/17/23 procedure.

## 2023-03-02 NOTE — Telephone Encounter (Signed)
Our office has attempted x3 to obtain preop clearance. I will remove from preop pool and forward notes as FYI.

## 2023-03-02 NOTE — Telephone Encounter (Signed)
Noted.  Thanks for trying. I will copy his PCP (FYI)

## 2023-03-03 NOTE — Telephone Encounter (Signed)
Please advise 

## 2023-03-03 NOTE — Telephone Encounter (Signed)
I think the pregabalin is very reasonable and a good medication to treat neuropathy. HE can start it and then follow up with [place] or with Dr Terrace Arabia in a few weeks.  Katina Degree. Jimmey Ralph, MD 03/03/2023 3:12 PM

## 2023-03-17 ENCOUNTER — Encounter: Payer: Self-pay | Admitting: Internal Medicine

## 2023-03-17 ENCOUNTER — Ambulatory Visit (AMBULATORY_SURGERY_CENTER): Payer: Medicare HMO | Admitting: Internal Medicine

## 2023-03-17 VITALS — BP 100/64 | HR 73 | Temp 98.6°F | Resp 17 | Ht 69.0 in | Wt 238.0 lb

## 2023-03-17 DIAGNOSIS — K3189 Other diseases of stomach and duodenum: Secondary | ICD-10-CM

## 2023-03-17 DIAGNOSIS — K222 Esophageal obstruction: Secondary | ICD-10-CM | POA: Diagnosis not present

## 2023-03-17 DIAGNOSIS — R1310 Dysphagia, unspecified: Secondary | ICD-10-CM | POA: Diagnosis not present

## 2023-03-17 DIAGNOSIS — K219 Gastro-esophageal reflux disease without esophagitis: Secondary | ICD-10-CM

## 2023-03-17 NOTE — Op Note (Signed)
[facility] Patient Name: Brent Taylor Procedure Date: 03/17/2023 9:47 AM MRN: [id number] Endoscopist: Wilhemina Bonito. Marina Goodell , MD, [id number] Age: 82 Referring MD:  Date of Birth: [birth year] Gender: Male Account #: [id number] Procedure:                Upper GI endoscopy with balloon dilation of the                            esophagus. 19 mm max Indications:              Therapeutic procedure, Dysphagia, Esophageal                            reflux. Recent food impaction requiring endoscopic                            attention Medicines:                Monitored Anesthesia Care Procedure:                Pre-Anesthesia Assessment:                           - Prior to the procedure, a History and Physical                            was performed, and patient medications and                            allergies were reviewed. The patient's tolerance of                            previous anesthesia was also reviewed. The risks                            and benefits of the procedure and the sedation                            options and risks were discussed with the patient.                            All questions were answered, and informed consent                            was obtained. Prior Anticoagulants: The patient has                            taken Eliquis (apixaban), last dose was 4 days                            prior to procedure. ASA Grade Assessment: III - A                            patient with severe systemic disease. After  reviewing the risks and benefits, the patient was                            deemed in satisfactory condition to undergo the                            procedure.                           After obtaining informed consent, the endoscope was                            passed under direct vision. Throughout the                            procedure, the patient's blood pressure, pulse, and                             oxygen saturations were monitored continuously. The                            Olympus Scope [id number] was introduced through the                            mouth, and advanced to the second part of duodenum.                            The upper GI endoscopy was accomplished without                            difficulty. The patient tolerated the procedure                            well. Scope In: Scope Out: Findings:                 The esophagus revealed a distal esophageal                            stricture which was ringlike in nature. This                            measured approximately 14 mm in diameter. A TTS                            dilator was passed through the scope. Dilation with                            an 18-19-20 mm balloon dilator was performed to 19                            mm. The ring was disrupted (see images). With                            minimal bleeding.  Esophagus was somewhat tortuous                            but otherwise normal.                           The stomach revealed a large hiatal hernia.                            Otherwise normal.                           The examined duodenum revealed a benign-appearing                            13 mm firm submucosal lesion in the bulb.                           The cardia and gastric fundus were normal on                            retroflexion, save hiatal hernia. Complications:            No immediate complications. Estimated Blood Loss:     Estimated blood loss: none. Impression:               1. GERD complicated by peptic stricture status post                            balloon dilation esophagus.                           2. Large hiatal hernia                           3. Benign-appearing submucosal lesion in the                            duodenal bulb. Recommendation:           1. Patient has a contact number available for                            emergencies. The signs and symptoms of  potential                            delayed complications were discussed with the                            patient. Return to normal activities tomorrow.                            Written discharge instructions were provided to the                            patient.  2. Resume previous diet.                           3. Continue present medications.                           4. Resume Eliquis therapy tomorrow                           5. Office follow-up with Dr. Marina Goodell in 6 to 8 weeks Wilhemina Bonito. Marina Goodell, MD 03/17/2023 10:07:00 AM This report has been signed electronically.

## 2023-03-17 NOTE — Progress Notes (Signed)
Vitals-CW  Pt's states no medical or surgical changes since previsit or office visit. 

## 2023-03-17 NOTE — Progress Notes (Signed)
Sedate, gd SR, tolerated procedure well, VSS, report to RN 

## 2023-03-17 NOTE — Progress Notes (Signed)
HISTORY OF PRESENT ILLNESS:  Brent Taylor is a 82 y.o. male with multiple medical problems including chronic atrial fibrillation for which she is on Eliquis therapy.  He presents today after recent food impaction necessitating emergency room visit urgent endoscopy with Dr. Beavers.  Found to have food in the esophagus without obvious stricture.  Not dilated.  Incidental submucosal duodenal nodule.  His pantoprazole was increased from 20 mg daily to 40 mg twice daily.  He was sent to follow-up in this office for further evaluation strong consideration of endoscopy with esophageal dilation.  He is accompanied today by his wife.  I last saw the patient 2017.  At that dictation.  Did have a history of needing esophageal dilation intermittently for issues with dysphagia and transient food impactions as well as food impactions.  This was his work was done in [place].  Patient tells me that he has intermittent solid food dysphagia, not infrequently.  No problems with liquids.  No active reflux symptoms on PPI.  He has been on Eliquis for about 2 years for atrial fibrillation.  He has come off of Eliquis several times for procedures such as epidural injections and neck surgery.  His ejection fraction on his echo is 55%.  Review of blood work from April 2024 shows normal comprehensive metabolic panel save for elevated glucose.  CBC with hemoglobin 12.7.  Aforementioned endoscopy with Dr. Beavers was performed February 05, 2023.  REVIEW OF SYSTEMS:  All non-GI ROS negative unless otherwise stated in the HPI except for back pain, irregular heartbeat, muscle cramps, fatigue, sleeping problems, medical swelling, shortness of breath  Past Medical History:  Diagnosis Date   Atrial fibrillation (HCC)    Cancer (HCC)    hx - melanoma   Chicken pox    Colon polyps    Coronary artery disease    Depression    No need for therapy.  Improved   Diabetes (HCC)    Diverticula, colon    Duodenal nodule    Dyspnea    with  exertion   GERD (gastroesophageal reflux disease)    Hemorrhoids    Hyperlipidemia    Hypertension    Sepsis (HCC)    Sepsis (HCC)    Sleep apnea    Tinnitus    UTI (lower urinary tract infection)     Past Surgical History:  Procedure Laterality Date   CARDIAC CATHETERIZATION     ESOPHAGOGASTRODUODENOSCOPY N/A 02/05/2023   Procedure: ESOPHAGOGASTRODUODENOSCOPY (EGD);  Surgeon: Beavers, Kimberly, MD;  Location: [facility];  Service: Gastroenterology;  Laterality: N/A;   FOREIGN BODY REMOVAL  02/05/2023   Procedure: FOREIGN BODY REMOVAL;  Surgeon: Beavers, Kimberly, MD;  Location: [facility];  Service: Gastroenterology;;   LUMBAR LAMINECTOMY/DECOMPRESSION MICRODISCECTOMY N/A 11/08/2022   Procedure: LUMBAR FOUR FIVE DECOMPRESSION AND IN SITU FUSION;  Surgeon: Brooks, Dahari, MD;  Location: [facility] OR;  Service: Orthopedics;  Laterality: N/A;  150min   MELANOMA EXCISION     LEFT CHEST   MELANOMA EXCISION      Social History Rufus Yankovich  reports that he has never smoked. He has never used smokeless tobacco. He reports that he does not drink alcohol and does not use drugs.  family history includes Heart Problems in his mother.  Allergies  Allergen Reactions   Gabapentin Other (See Comments)    Lethargic and depressed, aching   Lipitor [Atorvastatin Calcium]     Muscle ache       PHYSICAL EXAMINATION: Vital signs: BP 112/64 (BP Location: Left   Arm, Patient Position: Sitting, Cuff Size: Normal)   Pulse 88   Ht 5' 9" (1.753 m) Comment: height measured without shoes  Wt 238 lb 6 oz (108.1 kg)   BMI 35.20 kg/m   Constitutional: generally well-appearing, no acute distress Psychiatric: alert and oriented x3, cooperative Eyes: extraocular movements intact, anicteric, conjunctiva pink Mouth: oral pharynx moist, no lesions Neck: supple no lymphadenopathy Cardiovascular: Irregularly irregular rate and rhythm, no murmur Lungs: clear to auscultation bilaterally Abdomen: soft,  nontender, nondistended, no obvious ascites, no peritoneal signs, normal bowel sounds, no organomegaly Rectal: Omitted Extremities: no clubbing, cyanosis, or lower extremity edema bilaterally Skin: no lesions on visible extremities Neuro: No focal deficits.  Renal nerves intact  ASSESSMENT:  1.  Intermittent solid food dysphagia with recent food impaction requiring emergency room visit and urgent endoscopy.  He has a history of esophageal stricture requiring dilation.  Not dilated at the time of most recent endoscopy. 2.  Chronic GERD.  On PPI. 3.  Multiple medical problems including chronic atrial fibrillation on Eliquis.  He is in atrial fibrillation today with controlled rate and no symptoms. 4.  Submucosal duodenal nodule.  Incidental.  Will require no further attention in this 82-year-old with multiple comorbidities  PLAN:  1.  Reflux precautions 2.  Decrease pantoprazole to 40 mg once daily 3.  Schedule upper endoscopy with esophageal dilation.  The patient is HIGH RISK given his comorbidities, age, and the need to interrupt anticoagulation therapy.The nature of the procedure, as well as the risks, benefits, and alternatives were carefully and thoroughly reviewed with the patient. Ample time for discussion and questions allowed. The patient understood, was satisfied, and agreed to proceed. 4.  We will reach out to his cardiologist regarding holding his Eliquis for 2 days prior to the procedure.  Would anticipate resumption of anticoagulation either that same day or the next day following endoscopy. 5.  Ongoing general medical care with Dr. [name]      

## 2023-03-17 NOTE — Progress Notes (Signed)
Called to room to assist during endoscopic procedure.  Patient ID and intended procedure confirmed with present staff. Received instructions for my participation in the procedure from the performing physician.  

## 2023-03-17 NOTE — Patient Instructions (Addendum)
-   Follow post-dilation diet today  - Tomorrow resume previous diet. - Continue present medications. - Resume Eliquis therapy tomorrow - Office follow-up with Dr. Marina Goodell on Wednesday, August 7 at 10:20 am   YOU HAD AN ENDOSCOPIC PROCEDURE TODAY AT THE [facility]:   Refer to the procedure report that was given to you for any specific questions about what was found during the examination.  If the procedure report does not answer your questions, please call your gastroenterologist to clarify.  If you requested that your care partner not be given the details of your procedure findings, then the procedure report has been included in a sealed envelope for you to review at your convenience later.  YOU SHOULD EXPECT: Some feelings of bloating in the abdomen. Passage of more gas than usual.  Walking can help get rid of the air that was put into your GI tract during the procedure and reduce the bloating. If you had a lower endoscopy (such as a colonoscopy or flexible sigmoidoscopy) you may notice spotting of blood in your stool or on the toilet paper. If you underwent a bowel prep for your procedure, you may not have a normal bowel movement for a few days.  Please Note:  You might notice some irritation and congestion in your nose or some drainage.  This is from the oxygen used during your procedure.  There is no need for concern and it should clear up in a day or so.  SYMPTOMS TO REPORT IMMEDIATELY:  Following upper endoscopy (EGD)  Vomiting of blood or coffee ground material  New chest pain or pain under the shoulder blades  Painful or persistently difficult swallowing  New shortness of breath  Fever of 100F or higher  Black, tarry-looking stools  For urgent or emergent issues, a gastroenterologist can be reached at any hour by calling [phone]. Do not use MyChart messaging for urgent concerns.    DIET:  We do recommend a small meal at first, but then you may proceed to your  regular diet.  Drink plenty of fluids but you should avoid alcoholic beverages for 24 hours.  ACTIVITY:  You should plan to take it easy for the rest of today and you should NOT DRIVE or use heavy machinery until tomorrow (because of the sedation medicines used during the test).    FOLLOW UP: Our staff will call the number listed on your records the next business day following your procedure.  We will call around 7:15- 8:00 am to check on you and address any questions or concerns that you may have regarding the information given to you following your procedure. If we do not reach you, we will leave a message.     If any biopsies were taken you will be contacted by phone or by letter within the next 1-3 weeks.  Please call us at [phone] if you have not heard about the biopsies in 3 weeks.    SIGNATURES/CONFIDENTIALITY: You and/or your care partner have signed paperwork which will be entered into your electronic medical record.  These signatures attest to the fact that that the information above on your After Visit Summary has been reviewed and is understood.  Full responsibility of the confidentiality of this discharge information lies with you and/or your care-partner.

## 2023-03-18 ENCOUNTER — Telehealth: Payer: Self-pay

## 2023-03-18 NOTE — Telephone Encounter (Signed)
Follow up call to pt, lm for pt to call if having any difficulty with normal activities or eating and drinking.  Also to call if any other questions or concerns.  

## 2023-03-22 ENCOUNTER — Ambulatory Visit: Payer: Medicare HMO | Admitting: Nurse Practitioner

## 2023-03-23 ENCOUNTER — Encounter: Payer: Self-pay | Admitting: Internal Medicine

## 2023-04-22 ENCOUNTER — Encounter: Payer: Self-pay | Admitting: Internal Medicine

## 2023-04-22 MED ORDER — PANTOPRAZOLE SODIUM 40 MG PO TBEC: 40.0000 mg | DELAYED_RELEASE_TABLET | Freq: Every day | ORAL | 1 refills | Status: DC

## 2023-04-25 ENCOUNTER — Other Ambulatory Visit: Payer: Self-pay | Admitting: Family Medicine

## 2023-04-25 DIAGNOSIS — Z4889 Encounter for other specified surgical aftercare: Secondary | ICD-10-CM | POA: Diagnosis not present

## 2023-04-26 ENCOUNTER — Encounter (HOSPITAL_BASED_OUTPATIENT_CLINIC_OR_DEPARTMENT_OTHER): Payer: Self-pay

## 2023-04-26 ENCOUNTER — Telehealth: Payer: Self-pay | Admitting: Internal Medicine

## 2023-04-26 ENCOUNTER — Encounter: Payer: Self-pay | Admitting: Family Medicine

## 2023-04-26 ENCOUNTER — Encounter: Payer: Self-pay | Admitting: Internal Medicine

## 2023-04-26 ENCOUNTER — Other Ambulatory Visit: Payer: Self-pay

## 2023-04-26 ENCOUNTER — Emergency Department (HOSPITAL_BASED_OUTPATIENT_CLINIC_OR_DEPARTMENT_OTHER)
Admission: EM | Admit: 2023-04-26 | Discharge: 2023-04-26 | Disposition: A | Discharge status: Home / Self Care | Payer: Medicare HMO | Attending: Emergency Medicine | Admitting: Emergency Medicine

## 2023-04-26 ENCOUNTER — Ambulatory Visit: Payer: Medicare HMO | Admitting: Family Medicine

## 2023-04-26 VITALS — BP 127/78 | HR 70 | Temp 97.1°F | Ht 69.0 in | Wt 243.8 lb

## 2023-04-26 DIAGNOSIS — R09A2 Foreign body sensation, throat: Secondary | ICD-10-CM

## 2023-04-26 DIAGNOSIS — Z7901 Long term (current) use of anticoagulants: Secondary | ICD-10-CM | POA: Diagnosis not present

## 2023-04-26 DIAGNOSIS — K219 Gastro-esophageal reflux disease without esophagitis: Secondary | ICD-10-CM

## 2023-04-26 DIAGNOSIS — E1159 Type 2 diabetes mellitus with other circulatory complications: Secondary | ICD-10-CM | POA: Diagnosis not present

## 2023-04-26 DIAGNOSIS — R0989 Other specified symptoms and signs involving the circulatory and respiratory systems: Secondary | ICD-10-CM | POA: Insufficient documentation

## 2023-04-26 DIAGNOSIS — G6289 Other specified polyneuropathies: Secondary | ICD-10-CM | POA: Diagnosis not present

## 2023-04-26 DIAGNOSIS — I152 Hypertension secondary to endocrine disorders: Secondary | ICD-10-CM | POA: Diagnosis not present

## 2023-04-26 DIAGNOSIS — F458 Other somatoform disorders: Secondary | ICD-10-CM | POA: Diagnosis not present

## 2023-04-26 LAB — POCT GLYCOSYLATED HEMOGLOBIN (HGB A1C): Hemoglobin A1C: 6.1 % — AB (ref 4.0–5.6)

## 2023-04-26 MED ORDER — LIDOCAINE VISCOUS HCL 2 % MT SOLN
15.0000 mL | Freq: Once | OROMUCOSAL | Status: AC
  Administered 2023-04-26: 15 mL via OROMUCOSAL
  Filled 2023-04-26: qty 15

## 2023-04-26 MED ORDER — ALUM & MAG HYDROXIDE-SIMETH 200-200-20 MG/5ML PO SUSP
30.0000 mL | Freq: Once | ORAL | Status: AC
  Administered 2023-04-26: 30 mL via ORAL
  Filled 2023-04-26: qty 30

## 2023-04-26 NOTE — ED Provider Notes (Signed)
[place] EMERGENCY DEPARTMENT AT [facility] Provider Note   CSN: [id number] Arrival date & time: 04/26/23  1247     History  Chief Complaint  Patient presents with   Globus    Brent Taylor is a 82 y.o. male.  82 yo M with a chief complaints feel like something stuck in his throat.  The patient unfortunately had difficulty swallowing for some time and has had different gastroenterology procedures to try and fix this.  He had breakfast this morning and feels like something has not been able to been cleared.  He tries to cough it up and try to stick his finger down his throat but was unable to improve his symptoms.  He thinks maybe it is orange to get stuck in there.  He does not think it is a toothpick or fishbone or anything sharp otherwise.  He has been able to eat and drink afterwards.        Home Medications Prior to Admission medications   Medication Sig Start Date End Date Taking? Authorizing Provider  acetaminophen (TYLENOL) 650 MG CR tablet Take 1,300 mg by mouth daily as needed for pain.    [provider]  Ascorbic Acid (VITAMIN C PO) Take by mouth. Patient not taking: Reported on 04/26/2023    [provider]  Blood Glucose Monitoring Suppl (ONE TOUCH ULTRA 2) w/Device KIT Use daily to check blood sugar. 10/13/20   [name], MD  Cyanocobalamin (B-12 PO) Take 1 tablet by mouth daily. Patient not taking: Reported on 04/26/2023    [provider]  ELIQUIS 5 MG TABS tablet TAKE 1 TABLET BY MOUTH TWICE A DAY 11/09/22   [name], [name], MD  FLUoxetine (PROZAC) 40 MG capsule TAKE ONE CAPSULE BY MOUTH DAILY 07/12/22   [name], MD  fluticasone [facility]) 50 MCG/ACT nasal spray Place 1 spray into both nostrils daily as needed for allergies or rhinitis.    [provider]  Lancets [facility] ULTRASOFT) lancets USE TO TEST BLOOD SUGAR DAILY 02/01/23   [name], MD  losartan (COZAAR) 50 MG tablet TAKE 1 TABLET BY  MOUTH DAILY 01/17/23   [name], MD  MAGNESIUM PO Take 1 tablet by mouth daily. Patient not taking: Reported on 04/26/2023    [provider]  metoprolol tartrate (LOPRESSOR) 25 MG tablet TAKE 1/2 TABLET BY MOUTH TWICE A DAY Patient taking differently: Take 12.5 mg by mouth 2 (two) times daily. 10/05/22   [name], Rondel Jumbo, MD  Omega-3 Fatty Acids (FISH OIL PO) Take 1 capsule by mouth daily. Patient not taking: Reported on 04/26/2023    [provider]  [facility] ULTRA TEST test strip USE TO TEST BLOOD SUGAR AS DIRECTED 04/26/23   [name], MD  pantoprazole (PROTONIX) 40 MG tablet Take 1 tablet (40 mg total) by mouth daily. 04/22/23   Hilarie Fredrickson, MD  pravastatin (PRAVACHOL) 80 MG tablet Take 1 tablet (80 mg total) by mouth every evening. 12/15/22   [name], Rondel Jumbo, MD  traMADol (ULTRAM) 50 MG tablet Take 50 mg by mouth daily as needed for moderate pain or severe pain. 11/18/22   [provider]      Allergies    Gabapentin and Lipitor [atorvastatin calcium]    Review of Systems   Review of Systems  Physical Exam Updated Vital Signs BP 137/79   Pulse 73   Temp 97.9 F (36.6 C) (Oral)   Resp 18   Ht 5'  10" (1.778 m)   Wt 108.9 kg   SpO2 99%   BMI 34.44 kg/m  Physical Exam Vitals and nursing note reviewed.  Constitutional:      Appearance: He is well-developed.  HENT:     Head: Normocephalic and atraumatic.     Mouth/Throat:     Comments: Posterior oropharynx without any edema erythema or drainage. Eyes:     Pupils: Pupils are equal, round, and reactive to light.  Neck:     Vascular: No JVD.  Cardiovascular:     Rate and Rhythm: Normal rate and regular rhythm.     Heart sounds: No murmur heard.    No friction rub. No gallop.  Pulmonary:     Effort: No respiratory distress.     Breath sounds: No wheezing.  Abdominal:     General: There is no distension.     Tenderness: There is no abdominal tenderness. There is no guarding  or rebound.  Musculoskeletal:        General: Normal range of motion.     Cervical back: Normal range of motion and neck supple.  Skin:    Coloration: Skin is not pale.     Findings: No rash.  Neurological:     Mental Status: He is alert and oriented to person, place, and time.  Psychiatric:        Behavior: Behavior normal.     ED Results / Procedures / Treatments   Labs (all labs ordered are listed, but only abnormal results are displayed) Labs Reviewed - No data to display  EKG None  Radiology No results found.  Procedures Procedures    Medications Ordered in ED Medications  alum & mag hydroxide-simeth (MAALOX/MYLANTA) 200-200-20 MG/5ML suspension 30 mL (30 mLs Oral Given 04/26/23 1353)  lidocaine (XYLOCAINE) 2 % viscous mouth solution 15 mL (15 mLs Mouth/Throat Given 04/26/23 1353)    ED Course/ Medical Decision Making/ A&P                             Medical Decision Making Risk OTC drugs. Prescription drug management.   82 yo M with a chief complaints of feeling excellently get stuck in his throat.  He thinks maybe it was part of an orange.  He has been seen recently by gastroenterology and had endoscopic performed.  He has been able to swallow otherwise.  GI cocktail here with improvement.  Patient feels like the object is either gone or moved.  He has been able to eat and drink here without issue.  Encouraged him to call his gastroenterologist and set up an appointment.  3:13 PM:  I have discussed the diagnosis/risks/treatment options with the patient and family.  Evaluation and diagnostic testing in the emergency department does not suggest an emergent condition requiring admission or immediate intervention beyond what has been performed at this time.  They will follow up with GI. We also discussed returning to the ED immediately if new or worsening sx occur. We discussed the sx which are most concerning (e.g., sudden worsening pain, fever, inability to  tolerate by mouth) that necessitate immediate return. Medications administered to the patient during their visit and any new prescriptions provided to the patient are listed below.  Medications given during this visit Medications  alum & mag hydroxide-simeth (MAALOX/MYLANTA) 200-200-20 MG/5ML suspension 30 mL (30 mLs Oral Given 04/26/23 1353)  lidocaine (XYLOCAINE) 2 % viscous mouth solution 15 mL (15 mLs Mouth/Throat Given 04/26/23  1353)     The patient appears reasonably screen and/or stabilized for discharge and I doubt any other medical condition or other [facility] requiring further screening, evaluation, or treatment in the ED at this time prior to discharge.          Final Clinical Impression(s) / ED Diagnoses Final diagnoses:  Globus sensation    Rx / DC Orders ED Discharge Orders     None         Melene Plan, DO 04/26/23 1513

## 2023-04-26 NOTE — Assessment & Plan Note (Signed)
Likely multifactorial though is improving after his lumbar decompression surgery on earlier this year.  He is also working with chiropractor and getting what sounds like TENS treatment.  He thinks this is helping.  He is doing a great job keeping his A1c under control as well.  Hopefully will have continued improvement over the next several months.

## 2023-04-26 NOTE — Assessment & Plan Note (Signed)
A1c well-controlled 6.1.  Congratulated patient on A1c reduction.  He is doing a great job with diet and exercise.  He will come back in 6 months and we will recheck at that time.

## 2023-04-26 NOTE — Discharge Instructions (Signed)
Call the office today to see when they can see you.  Return for inability to swallow.

## 2023-04-26 NOTE — ED Notes (Signed)
Patient verbalizes understanding of discharge instructions. Opportunity for questioning and answers were provided. Patient discharged from ED.  °

## 2023-04-26 NOTE — Progress Notes (Signed)
   Brent Taylor is a 82 y.o. male who presents today for an office visit.  Assessment/Plan:  Chronic Problems Addressed Today: Type 2 diabetes mellitus with vascular disease (HCC) A1c well-controlled 6.1.  Congratulated patient on A1c reduction.  He is doing a great job with diet and exercise.  He will come back in 6 months and we will recheck at that time.  Hypertension associated with diabetes (HCC) Blood pressure at goal on metoprolol tartrate 12.5 mg twice daily and losartan 50 mg daily.  Peripheral neuropathy Likely multifactorial though is improving after his lumbar decompression surgery on earlier this year.  He is also working with chiropractor and getting what sounds like TENS treatment.  He thinks this is helping.  He is doing a great job keeping his A1c under control as well.  Hopefully will have continued improvement over the next several months.     Subjective:  HPI:  See A/P for status of chronic conditions.  Patient is here today for follow-up.  Last seen about 3 months ago.  This last A1c was 6.6.  He has been working on diet and exercise.  Neuropathy seems to be improving.  He is working with chiropractor for this.  He also did recently undergo a lumbar decompression and is doing well postoperatively for this.  He did see his neurosurgeon for this recently and they told him it may take up to a year for his neuropathy to improve.       Objective:  Physical Exam: BP 127/78   Pulse 70   Temp (!) 97.1 F (36.2 C) (Temporal)   Ht 5\' 9"  (1.753 m)   Wt 243 lb 12.8 oz (110.6 kg)   SpO2 95%   BMI 36.00 kg/m   Gen: No acute distress, resting comfortably CV: Regular rate and rhythm with no murmurs appreciated Pulm: Normal work of breathing, clear to auscultation bilaterally with no crackles, wheezes, or rhonchi Neuro: Grossly normal, moves all extremities Psych: Normal affect and thought content      Brent Taylor M. Jimmey Ralph, MD 04/26/2023 8:32 AM

## 2023-04-26 NOTE — Telephone Encounter (Signed)
Inbound call from patient stating that he was advised to go to the ER. Patient stated that he went and was seen and was advised to follow up with Dr. Marina Goodell. I advised him that he has a follow up with Dr. Marina Goodell on 8/7 at 10:40. Patient requested to speak with nurse regarding appointment. Please advise.

## 2023-04-26 NOTE — Assessment & Plan Note (Signed)
Blood pressure at goal on metoprolol tartrate 12.5 mg twice daily and losartan 50 mg daily.

## 2023-04-26 NOTE — Patient Instructions (Signed)
It was very nice to see you today!  Your A1c looks great!  Keep up the great work.  Return in about 6 months (around 10/27/2023) for Annual Physical.   Take care, Dr Jimmey Ralph  PLEASE NOTE:  If you had any lab tests, please let us know if you have not heard back within a few days. You may see your results on mychart before we have a chance to review them but we will give you a call once they are reviewed by [place].   If we ordered any referrals today, please let us know if you have not heard from their office within the next week.   If you had any urgent prescriptions sent in today, please check with the pharmacy within an hour of our visit to make sure the prescription was transmitted appropriately.   Please try these tips to maintain a healthy lifestyle:  Eat at least 3 REAL meals and 1-2 snacks per day.  Aim for no more than 5 hours between eating.  If you eat breakfast, please do so within one hour of getting up.   Each meal should contain half fruits/vegetables, one quarter protein, and one quarter carbs (no bigger than a computer mouse)  Cut down on sweet beverages. This includes juice, soda, and sweet tea.   Drink at least 1 glass of water with each meal and aim for at least 8 glasses per day  Exercise at least 150 minutes every week.

## 2023-04-26 NOTE — ED Triage Notes (Signed)
Patient here POV from Home.  Endorses Globus Sensation that began this AM. Does not recall if it was any food item or FB. No Oral/Airway Obstruction.   NAD Noted during Triage. A&Ox4. GCS 15. Ambulatory.

## 2023-04-27 NOTE — Telephone Encounter (Signed)
1.  Throat lozenge 2.  Soft foods 3.  Keep follow-up appointment with

## 2023-04-27 NOTE — Telephone Encounter (Signed)
 Spoke with pt and he is aware of recommendations per Dr. Marina Goodell.

## 2023-04-27 NOTE — Telephone Encounter (Signed)
Pt called yesterday and stated he had the sensation that something was stuck in his throat. He was seen in the ER. He states that he thinks it was a pill that got stuck but this am he has a sore throat. He is able to eat and drink fine. Pt wants to know if there is something he can do for his sore throat. Please advise.

## 2023-05-04 DIAGNOSIS — G4733 Obstructive sleep apnea (adult) (pediatric): Secondary | ICD-10-CM | POA: Diagnosis not present

## 2023-05-06 ENCOUNTER — Telehealth: Payer: Self-pay | Admitting: *Deleted

## 2023-05-06 NOTE — Telephone Encounter (Signed)
Transition Care Management Follow-up Telephone Call Date of discharge and from where: 04/26/2023  [facility]  How have you been since you were released from the hospital?Doing well  Any questions or concerns? No  Items Reviewed: Did the pt receive and understand the discharge instructions provided? Yes  Medications obtained and verified? No  Other? No  Any new allergies since your discharge? No  Dietary orders reviewed? No Do you have support at home? Yes    Follow up appointments reviewed:  PCP Hospital f/u appt confirmed? Yes  HAS FOLLOWED UP WITH PCP AND WILL SEE [facility] DR [facility]  Are transportation arrangements needed? No  If their condition worsens, is the pt aware to call PCP or go to the Emergency Dept.? Yes Was the patient provided with contact information for the PCP's office or ED? Yes Was to pt encouraged to call back with questions or concerns? Yes

## 2023-05-07 ENCOUNTER — Other Ambulatory Visit: Payer: Self-pay | Admitting: Internal Medicine

## 2023-05-07 DIAGNOSIS — I480 Paroxysmal atrial fibrillation: Secondary | ICD-10-CM

## 2023-05-09 NOTE — Telephone Encounter (Signed)
Prescription refill request for Eliquis received. Indication:afib Last office visit:4/24 Scr:1.07  4/24 Age: 82 Weight:108.9  kg  Prescription refilled

## 2023-05-18 ENCOUNTER — Encounter: Payer: Self-pay | Admitting: Internal Medicine

## 2023-05-18 ENCOUNTER — Ambulatory Visit: Payer: Medicare HMO | Admitting: Internal Medicine

## 2023-05-18 VITALS — BP 118/70 | HR 44 | Ht 69.0 in | Wt 242.0 lb

## 2023-05-18 DIAGNOSIS — R1310 Dysphagia, unspecified: Secondary | ICD-10-CM

## 2023-05-18 DIAGNOSIS — K222 Esophageal obstruction: Secondary | ICD-10-CM

## 2023-05-18 DIAGNOSIS — K219 Gastro-esophageal reflux disease without esophagitis: Secondary | ICD-10-CM

## 2023-05-18 MED ORDER — PANTOPRAZOLE SODIUM 40 MG PO TBEC: 40.0000 mg | DELAYED_RELEASE_TABLET | Freq: Every day | ORAL | 3 refills | Status: DC

## 2023-05-18 NOTE — Patient Instructions (Signed)
We have sent the following medications to your pharmacy for you to pick up at your convenience:  Pantoprazole  Please follow up in one year  

## 2023-05-18 NOTE — Progress Notes (Signed)
HISTORY OF PRESENT ILLNESS:  Brent Taylor is a 82 y.o. male who was evaluated in this office Feb 24, 2023 for intermittent solid food dysphagia and chronic GERD.  See that dictation for details.  He subsequently underwent upper endoscopy with esophageal dilation, off anticoagulation.  He was found to have a distal esophageal stricture which was dilated with 20 mm balloon.  He was continued on pantoprazole 40 mg daily.  He follows up at this time.  Patient is pleased report that he has had no reflux symptoms.  No issues with dysphagia.  He is quite pleased.  Tolerating his medication well  REVIEW OF SYSTEMS:  All non-GI ROS negative unless otherwise stated in the HPI. Past Medical History:  Diagnosis Date   Atrial fibrillation (HCC)    Cancer (HCC)    hx - melanoma   Chicken pox    Colon polyps    Coronary artery disease    Depression    No need for therapy.  Improved   Diabetes (HCC)    Diverticula, colon    Duodenal nodule    Dyspnea    with exertion   GERD (gastroesophageal reflux disease)    Hemorrhoids    Hyperlipidemia    Hypertension    Sepsis (HCC)    Sepsis (HCC)    Sleep apnea    Tinnitus    UTI (lower urinary tract infection)     Past Surgical History:  Procedure Laterality Date   CARDIAC CATHETERIZATION     ESOPHAGOGASTRODUODENOSCOPY N/A 02/05/2023   Procedure: ESOPHAGOGASTRODUODENOSCOPY (EGD);  Surgeon: Brent Danas, MD;  Location: [facility] ENDOSCOPY;  Service: Gastroenterology;  Laterality: N/A;   FOREIGN BODY REMOVAL  02/05/2023   Procedure: FOREIGN BODY REMOVAL;  Surgeon: Brent Danas, MD;  Location: [facility] ENDOSCOPY;  Service: Gastroenterology;;   LUMBAR LAMINECTOMY/DECOMPRESSION MICRODISCECTOMY N/A 11/08/2022   Procedure: LUMBAR FOUR FIVE DECOMPRESSION AND IN SITU FUSION;  Surgeon: Brent Lick, MD;  Location: [facility] OR;  Service: Orthopedics;  Laterality: N/A;    MELANOMA EXCISION     LEFT CHEST   MELANOMA EXCISION      Social History Brent Taylor   reports that he has never smoked. He has never used smokeless tobacco. He reports that he does not drink alcohol and does not use drugs.  family history includes Heart Problems in his mother.  Allergies  Allergen Reactions   Gabapentin Other (See Comments)    Lethargic and depressed, aching   Lipitor [Atorvastatin Calcium]     Muscle ache       PHYSICAL EXAMINATION: Vital signs: BP 118/70   Pulse (!) 44   Ht 5\' 9"  (1.753 m)   Wt 242 lb (109.8 kg)   BMI 35.74 kg/m   Constitutional: generally well-appearing, no acute distress Psychiatric: alert and oriented x3, cooperative Eyes: Anicteric Abdomen: Not reexamined Extremities: Unremarkable Skin: no relevant lesions on visible extremities Neuro: Grossly intact  ASSESSMENT:  1.  GERD complicated by peptic stricture.  Asymptomatic post dilation on PPI   PLAN:  1.  Reflux precautions 2.  Refill pantoprazole 40 mg daily.  Medication risk. 3.  Routine office follow-up 1 year.  Sooner if needed.  He agrees. 4.  Resume general medical care with Dr. Jimmey Taylor

## 2023-05-24 NOTE — Progress Notes (Unsigned)
Cardiology Office Note:    Date:  05/25/2023   ID:  Brent Taylor, DOB 07/28/1941, MRN [id number]  PCP:  [name], MD   [facility] Providers Cardiologist:  [name], MD Sleep Medicine:  [name], MD     Referring MD: [name], MD   CC: Mild aortic dilation  History of Present Illness:    Brent Taylor is a 82 y.o. male with a hx of PAF, CAD win [place] in 2007 with moderate disease NOS, HLD, and incidental mild aortic dilation (4.0 cm) who presents for evaluation.  Seen 11/10/21 after CT aorta showed mild increase in diameter (42 mm). 2023: BP elevations 2024: had surgery.  Had slightly elevated K.  Improved symptoms.  Patient notes that he is doing well.   Since last visit notes that he cannot tell when he is in atrial fibrillation . EKG showed AF.    No chest pain or pressure .  No SOB and no PND/Orthopnea.  No weight gain or leg swelling.  No palpitations or syncope.  Is getting special laser therapy from chiropractor.  Notes more DOE with activity.    Past Medical History:  Diagnosis Date   Atrial fibrillation (HCC)    Cancer (HCC)    hx - melanoma   Chicken pox    Colon polyps    Coronary artery disease    Depression    No need for therapy.  Improved   Diabetes (HCC)    Diverticula, colon    Duodenal nodule    Dyspnea    with exertion   GERD (gastroesophageal reflux disease)    Hemorrhoids    Hyperlipidemia    Hypertension    Sepsis (HCC)    Sepsis (HCC)    Sleep apnea    Tinnitus    UTI (lower urinary tract infection)     Past Surgical History:  Procedure Laterality Date   CARDIAC CATHETERIZATION     ESOPHAGOGASTRODUODENOSCOPY N/A 02/05/2023   Procedure: ESOPHAGOGASTRODUODENOSCOPY (EGD);  Surgeon: Tressia Danas, MD;  Location: [facility] ENDOSCOPY;  Service: Gastroenterology;  Laterality: N/A;   FOREIGN BODY REMOVAL  02/05/2023   Procedure: FOREIGN BODY REMOVAL;  Surgeon: Tressia Danas, MD;  Location: [facility] ENDOSCOPY;  Service:  Gastroenterology;;   LUMBAR LAMINECTOMY/DECOMPRESSION MICRODISCECTOMY N/A 11/08/2022   Procedure: LUMBAR FOUR FIVE DECOMPRESSION AND IN SITU FUSION;  Surgeon: Venita Lick, MD;  Location: [facility] OR;  Service: Orthopedics;  Laterality: N/A;    MELANOMA EXCISION     LEFT CHEST   MELANOMA EXCISION      Current Medications: Current Meds  Medication Sig   acetaminophen (TYLENOL) 650 MG CR tablet Take 1,300 mg by mouth daily as needed for pain.   Ascorbic Acid (VITAMIN C PO) Take by mouth.   Blood Glucose Monitoring Suppl (ONE TOUCH ULTRA 2) w/Device KIT Use daily to check blood sugar.   Cyanocobalamin (B-12 PO) Take 1 tablet by mouth daily.   ELIQUIS 5 MG TABS tablet TAKE 1 TABLET BY MOUTH TWICE A DAY   FLUoxetine (PROZAC) 40 MG capsule TAKE ONE CAPSULE BY MOUTH DAILY   fluticasone (FLONASE) 50 MCG/ACT nasal spray Place 1 spray into both nostrils daily as needed for allergies or rhinitis.   Lancets (ONETOUCH ULTRASOFT) lancets USE TO TEST BLOOD SUGAR DAILY   losartan (COZAAR) 50 MG tablet TAKE 1 TABLET BY MOUTH DAILY   MAGNESIUM PO Take 1 tablet by mouth daily.   metoprolol tartrate (LOPRESSOR) 25 MG tablet TAKE 1/2 TABLET BY MOUTH  TWICE A DAY   Omega-3 Fatty Acids (FISH OIL PO) Take 1 capsule by mouth daily.   ONETOUCH ULTRA TEST test strip USE TO TEST BLOOD SUGAR AS DIRECTED   pantoprazole (PROTONIX) 40 MG tablet Take 1 tablet (40 mg total) by mouth daily.   pravastatin (PRAVACHOL) 80 MG tablet Take 1 tablet (80 mg total) by mouth every evening.   traMADol (ULTRAM) 50 MG tablet Take 50 mg by mouth daily as needed for moderate pain or severe pain.     Allergies:   Gabapentin and Lipitor [atorvastatin calcium]   Social History   Socioeconomic History   Marital status: Married    Spouse name: Not on file   Number of children: 2   Years of education: Not on file   Highest education level: Master's degree (e.g., MA, MS, MEng, MEd, MSW, MBA)  Occupational History   Occupation:  Optician, dispensing   Occupation: retired  Tobacco Use   Smoking status: Never   Smokeless tobacco: Never  Vaping Use   Vaping status: Never Used  Substance and Sexual Activity   Alcohol use: No   Drug use: No   Sexual activity: Not on file  Other Topics Concern   Not on file  Social History Narrative   Lives with wife in [place].   Retired [employer]   Attends [employer]     Social Determinants of Health   Financial Resource Strain: Low Risk  (12/23/2022)   Overall Financial Resource Strain (CARDIA)    Difficulty of Paying Living Expenses: Not hard at all  Food Insecurity: No Food Insecurity (01/14/2023)   Hunger Vital Sign    Worried About Running Out of Food in the Last Year: Never true    Ran Out of Food in the Last Year: Never true  Transportation Needs: No Transportation Needs (01/14/2023)   PRAPARE - Administrator, Civil Service (Medical): No    Lack of Transportation (Non-Medical): No  Physical Activity: Sufficiently Active (01/14/2023)   Exercise Vital Sign    Days of Exercise per Week: 7 days    Minutes of Exercise per Session: 60 min  Recent Concern: Physical Activity - Insufficiently Active (12/23/2022)   Exercise Vital Sign    Days of Exercise per Week: 5 days    Minutes of Exercise per Session: 20 min  Stress: No Stress Concern Present (01/14/2023)   [employer] of Occupational Health - Occupational Stress Questionnaire    Feeling of Stress : Only a little  Social Connections: Socially Integrated (01/14/2023)   Social Connection and Isolation Panel [NHANES]    Frequency of Communication with Friends and Family: More than three times a week    Frequency of Social Gatherings with Friends and Family: Twice a week    Attends Religious Services: More than 4 times per year    Active Member of [employer] or Organizations: Yes    Attends Engineer, structural: More than 4 times per year    Marital Status: Married     Family History: The  patient's family history includes Heart Problems in his mother. History of coronary artery disease notable for father with CABG. History of heart failure notable for father. History of arrhythmia notable for mother with palpitations. Denies family history of sudden cardiac death including drowning, car accidents, or unexplained deaths in the family. No history of bicuspid aortic valve or aortic aneurysm or dissection.    ROS:   Please see the history of present illness.  EKGs/Labs/Other Studies Reviewed:    The following studies were reviewed today:  EKG:  11/25/22: A fib 87 rare PVCs 11/09/2021: SR rate 81 1st HB with PACs and LVH 02/11/21: SR Rate 64 PACs LVH  Cardiac Studies & Procedures   CARDIAC CATHETERIZATION  CARDIAC CATHETERIZATION 09/21/2006     ECHOCARDIOGRAM  ECHOCARDIOGRAM COMPLETE 12/23/2022  Narrative ECHOCARDIOGRAM REPORT    Patient Name:   Brent Taylor   Date of Exam: 12/23/2022 Medical Rec #:  [id number]     Height:       72.0 in Accession #:    [id number]    Weight:       237.0 lb Date of Birth:  August 11, 1941      BSA:          2.290 m Patient Age:    82 years      BP:           110/80 mmHg Patient Gender: M             HR:           84 bpm. Exam Location:  [address]  Procedure: 2D Echo, 3D Echo, Cardiac Doppler and Color Doppler  Indications:    I77.819 Ascending Aortic Dilation  History:        Patient has prior history of Echocardiogram examinations, most recent 10/16/2021. CAD, Arrythmias:Atrial Fibrillation, Signs/Symptoms:Dyspnea; Risk Factors:Hypertension, Diabetes, Dyslipidemia, Former Smoker, Sleep Apnea and Family History of Coronary Artery Disease.  Sonographer:    Farrel Conners RDCS Referring Phys: [facility]   IMPRESSIONS   1. Left ventricular ejection fraction, by estimation, is 50 to 55%. The left ventricle has low normal function. The left ventricle has no regional wall motion abnormalities. There is mild concentric  left ventricular hypertrophy. Left ventricular diastolic parameters are indeterminate. 2. Right ventricular systolic function is normal. The right ventricular size is normal. There is normal pulmonary artery systolic pressure. 3. Left atrial size was severely dilated. 4. Right atrial size was severely dilated. 5. The mitral valve is normal in structure. Mild mitral valve regurgitation. No evidence of mitral stenosis. 6. The aortic valve is tricuspid. Aortic valve regurgitation is not visualized. No aortic stenosis is present. 7. Aortic dilatation noted. There is moderate dilatation of the ascending aorta, measuring 46 mm. 8. The inferior vena cava is normal in size with greater than 50% respiratory variability, suggesting right atrial pressure of 3 mmHg.  FINDINGS Left Ventricle: Left ventricular ejection fraction, by estimation, is 50 to 55%. The left ventricle has low normal function. The left ventricle has no regional wall motion abnormalities. The left ventricular internal cavity size was normal in size. There is mild concentric left ventricular hypertrophy. Left ventricular diastolic function could not be evaluated due to atrial fibrillation. Left ventricular diastolic parameters are indeterminate. Indeterminate filling pressures.  Right Ventricle: The right ventricular size is normal. No increase in right ventricular wall thickness. Right ventricular systolic function is normal. There is normal pulmonary artery systolic pressure. The tricuspid regurgitant velocity is 2.13 m/s, and with an assumed right atrial pressure of 3 mmHg, the estimated right ventricular systolic pressure is 21.1 mmHg.  Left Atrium: Left atrial size was severely dilated.  Right Atrium: Right atrial size was severely dilated.  Pericardium: There is no evidence of pericardial effusion.  Mitral Valve: The mitral valve is normal in structure. Mild mitral valve regurgitation. No evidence of mitral valve  stenosis.  Tricuspid Valve: The tricuspid valve is normal in structure. Tricuspid valve regurgitation is  mild . No evidence of tricuspid stenosis.  Aortic Valve: The aortic valve is tricuspid. Aortic valve regurgitation is not visualized. No aortic stenosis is present.  Pulmonic Valve: The pulmonic valve was normal in structure. Pulmonic valve regurgitation is not visualized. No evidence of pulmonic stenosis.  Aorta: Aortic dilatation noted. There is moderate dilatation of the ascending aorta, measuring 46 mm.  Venous: The inferior vena cava is normal in size with greater than 50% respiratory variability, suggesting right atrial pressure of 3 mmHg.  IAS/Shunts: No atrial level shunt detected by color flow Doppler.   LEFT VENTRICLE PLAX 2D LVIDd:         5.20 cm   Diastology LVIDs:         3.80 cm   LV e' medial:    10.60 cm/s LV PW:         1.10 cm   LV E/e' medial:  11.5 LV IVS:        1.10 cm   LV e' lateral:   13.35 cm/s LVOT diam:     2.40 cm   LV E/e' lateral: 9.1 LV SV:         56 LV SV Index:   24 LVOT Area:     4.52 cm  3D Volume EF: 3D EF:        48 % LV EDV:       162 ml LV ESV:       85 ml LV SV:        77 ml  RIGHT VENTRICLE RV Basal diam:  4.10 cm RV Mid diam:    3.00 cm RV S prime:     12.10 cm/s TAPSE (M-mode): 2.1 cm RVSP:           21.1 mmHg  LEFT ATRIUM              Index        RIGHT ATRIUM           Index LA diam:        4.80 cm  2.10 cm/m   RA Pressure: 3.00 mmHg LA Vol (A2C):   113.0 ml 49.35 ml/m  RA Area:     27.00 cm LA Vol (A4C):   80.5 ml  35.15 ml/m  RA Volume:   95.40 ml  41.66 ml/m LA Biplane Vol: 98.8 ml  43.15 ml/m AORTIC VALVE LVOT Vmax:   78.10 cm/s LVOT Vmean:  50.600 cm/s LVOT VTI:    0.124 m  AORTA Ao Root diam: 3.40 cm Ao Asc diam:  4.60 cm  MITRAL VALVE                TRICUSPID VALVE MV Area (PHT): cm          TR Peak grad:   18.1 mmHg MV Decel Time: 250 msec     TR Vmax:        213.00 cm/s MV E velocity: 122.00  cm/s  Estimated RAP:  3.00 mmHg RVSP:           21.1 mmHg  SHUNTS Systemic VTI:  0.12 m Systemic Diam: 2.40 cm  Chilton Si MD Electronically signed by Chilton Si MD Signature Date/Time: 12/23/2022/10:26:31 PM    Final    MONITORS  LONG TERM MONITOR (3-14 DAYS) 10/22/2021  Narrative Patch Wear Time:  13 days and 21 hours  Predominant underlying rhythm was sinus rhythm Multiple runs of SVT, longest and fastest 1 minute 33 seconds at an average of 161  bpm 24.8% supraventricular ectopy Less than 1% ventricular ectopy Symptoms of weakness associated with sinus rhythm and atrial ectopy  Will Camnitz, MD            Recent Labs: 10/26/2022: TSH 2.43 02/04/2023: ALT 12; BUN 20; Creatinine, Ser 1.07; Hemoglobin 12.7; Platelets 253; Potassium 4.3; Sodium 142  Recent Lipid Panel    Component Value Date/Time   CHOL 134 10/26/2022 0838   CHOL 123 01/25/2022 0910   TRIG 127.0 10/26/2022 0838   HDL 38.30 (L) 10/26/2022 0838   HDL 33 (L) 01/25/2022 0910   CHOLHDL 4 10/26/2022 0838   VLDL 25.4 10/26/2022 0838   LDLCALC 70 10/26/2022 0838   LDLCALC 68 01/25/2022 0910   LDLDIRECT 142.7 12/10/2011 1436    Risk Assessment/Calculations:    CHA2DS2-VASc Score = 5  This indicates a 7.2% annual risk of stroke. The patient's score is based upon: CHF History: 0 HTN History: 1 Diabetes History: 1 Stroke History: 0 Vascular Disease History: 1 Age Score: 2 Gender Score: 0       Physical Exam:    VS:  BP 122/78   Pulse 83   Ht 6' (1.829 m)   Wt 240 lb 3.2 oz (109 kg)   SpO2 98%   BMI 32.58 kg/m     Wt Readings from Last 3 Encounters:  05/25/23 240 lb 3.2 oz (109 kg)  05/18/23 242 lb (109.8 kg)  04/26/23 240 lb (108.9 kg)    Gen: No distress   Neck: No JVD Ears: Bilateral Homero Fellers Sign Cardiac: No Rubs or Gallops, no murmur, IRIRI +2 radial pulses Respiratory: Rare rhonchi, , normal effort, normal  respiratory rate GI: Soft, nontender, non-distended MS: +1  left leg edema;  moves all extremities Integument: Skin feels warm Neuro:  At time of evaluation, alert and oriented to person/place/time/situation  Psych: Normal affect, patient feels well  ASSESSMENT:    1. Coronary artery disease involving native coronary artery of native heart without angina pectoris   2. Essential hypertension   3. Paroxysmal atrial fibrillation (HCC)   4. Aortic dilatation (HCC)     PLAN:    DOE - with +1 edema, hx of HX in the past, and rare rhonchi - BNP; if high start lasix 20 mg PO daily for 5 days, if moderate lasix as PRN, if normal, CXR  Persistent AF - CHADSVASC 6 per EP (AGE x2, HTN, CAD; ? Of CHF with 2017 mrEF)  - continue eliquis and metoprolol has PRN diltiazem   - Will get one week non live zio-patch to assess burden, may see EP if high burden  Mild aortic dilation  - 42 mm, CT next year unless he gets a CT PV  CAD mod non obstructive LHC in [place] HLD and Aortic atherosclerosis Statin myalgias with atorvastatin - pravastatin 80 mg- LDL is now at goal with no symptoms  OSA on therapy HTN - K issues with ACEi, on losartan 50 mg BP controlled at K WNL; starting Cialis for ED and monitor for symptomatic hypotension  Tessa f/u in 2-3 months   Medication Adjustments/Labs and Tests Ordered: Current medicines are reviewed at length with the patient today.  Concerns regarding medicines are outlined above.  Orders Placed This Encounter  Procedures   Pro b natriuretic peptide (BNP)   LONG TERM MONITOR (3-14 DAYS)   EKG 12-Lead   No orders of the defined types were placed in this encounter.   Patient Instructions  Medication Instructions:  Your physician recommends that  you continue on your current medications as directed. Please refer to the Current Medication list given to you today.  *If you need a refill on your cardiac medications before your next appointment, please call your pharmacy*   Lab Work: BNP   If you have labs (blood  work) drawn today and your tests are completely normal, you will receive your results only by: MyChart Message (if you have MyChart) OR A paper copy in the mail If you have any lab test that is abnormal or we need to change your treatment, we will call you to review the results.   Testing/Procedures: Your physician has requested that you wear a heart monitor.   Follow-Up: At [facility], you and your health needs are our priority.  As part of our continuing mission to provide you with exceptional heart care, we have created designated Provider Care Teams.  These Care Teams include your primary Cardiologist (physician) and Advanced Practice Providers (APPs -  Physician Assistants and Nurse Practitioners) who all work together to provide you with the care you need, when you need it.   Your next appointment:   2-3 month(s)  Provider:   Jari Favre, PA-C       Other Instructions ZIO XT- Long Term Monitor Instructions  Your physician has requested you wear a ZIO patch monitor for 7 days.  This is a single patch monitor. [employer] supplies one patch monitor per enrollment. Additional stickers are not available. Please do not apply patch if you will be having a Nuclear Stress Test,  Echocardiogram, Cardiac CT, MRI, or Chest Xray during the period you would be wearing the  monitor. The patch cannot be worn during these tests. You cannot remove and re-apply the  ZIO XT patch monitor.  Your ZIO patch monitor will be mailed 3 day USPS to your address on file. It may take 3-5 days  to receive your monitor after you have been enrolled.  Once you have received your monitor, please review the enclosed instructions. Your monitor  has already been registered assigning a specific monitor serial # to you.  Billing and Patient Assistance Program Information  We have supplied [employer] with any of your insurance information on file for billing purposes. [employer] offers a sliding scale Patient  Assistance Program for patients that do not have  insurance, or whose insurance does not completely cover the cost of the ZIO monitor.  You must apply for the Patient Assistance Program to qualify for this discounted rate.  To apply, please call [employer] at [phone], select option 4, select option 2, ask to apply for  Patient Assistance Program. Meredeth Ide will ask your household income, and how many people  are in your household. They will quote your out-of-pocket cost based on that information.  [employer] will also be able to set up a 56-month, interest-free payment plan if needed.  Applying the monitor   Shave hair from upper left chest.  Hold abrader disc by orange tab. Rub abrader in 40 strokes over the upper left chest as  indicated in your monitor instructions.  Clean area with 4 enclosed alcohol pads. Let dry.  Apply patch as indicated in monitor instructions. Patch will be placed under collarbone on left  side of chest with arrow pointing upward.  Rub patch adhesive wings for 2 minutes. Remove [place] label marked "1". Remove the [place]  label marked "2". Rub patch adhesive wings for 2 additional minutes.  While looking in a mirror, press and release button  in center of patch. A small green light will  flash 3-4 times. This will be your only indicator that the monitor has been turned on.  Do not shower for the first 24 hours. You may shower after the first 24 hours.  Press the button if you feel a symptom. You will hear a small click. Record Date, Time and  Symptom in the Patient Logbook.  When you are ready to remove the patch, follow instructions on the last 2 pages of Patient  Logbook. Stick patch monitor onto the last page of Patient Logbook.  Place Patient Logbook in the blue and [place] box. Use locking tab on box and tape box closed  securely. The blue and [place] box has prepaid postage on it. Please place it in the mailbox as  soon as possible. Your physician should have your test  results approximately 7 days after the  monitor has been mailed back to [facility].  Call [facility] Customer Care at [phone] if you have questions regarding  your ZIO XT patch monitor. Call them immediately if you see an orange light blinking on your  monitor.  If your monitor falls off in less than 4 days, contact our Monitor department at [phone].  If your monitor becomes loose or falls off after 4 days call [employer] at [phone] for  suggestions on securing your monitor     Signed, [name], MD  05/25/2023 9:16 AM    [facility]

## 2023-05-25 ENCOUNTER — Ambulatory Visit: Payer: Medicare HMO | Attending: Internal Medicine | Admitting: Internal Medicine

## 2023-05-25 ENCOUNTER — Ambulatory Visit (INDEPENDENT_AMBULATORY_CARE_PROVIDER_SITE_OTHER): Payer: Medicare HMO

## 2023-05-25 ENCOUNTER — Encounter: Payer: Self-pay | Admitting: Internal Medicine

## 2023-05-25 VITALS — BP 122/78 | HR 83 | Ht 72.0 in | Wt 240.2 lb

## 2023-05-25 DIAGNOSIS — E1159 Type 2 diabetes mellitus with other circulatory complications: Secondary | ICD-10-CM | POA: Diagnosis not present

## 2023-05-25 DIAGNOSIS — I10 Essential (primary) hypertension: Secondary | ICD-10-CM | POA: Diagnosis not present

## 2023-05-25 DIAGNOSIS — I2510 Atherosclerotic heart disease of native coronary artery without angina pectoris: Secondary | ICD-10-CM

## 2023-05-25 DIAGNOSIS — E785 Hyperlipidemia, unspecified: Secondary | ICD-10-CM | POA: Diagnosis not present

## 2023-05-25 DIAGNOSIS — I480 Paroxysmal atrial fibrillation: Secondary | ICD-10-CM

## 2023-05-25 DIAGNOSIS — I77819 Aortic ectasia, unspecified site: Secondary | ICD-10-CM | POA: Diagnosis not present

## 2023-05-25 DIAGNOSIS — I152 Hypertension secondary to endocrine disorders: Secondary | ICD-10-CM

## 2023-05-25 DIAGNOSIS — G4733 Obstructive sleep apnea (adult) (pediatric): Secondary | ICD-10-CM

## 2023-05-25 DIAGNOSIS — E1169 Type 2 diabetes mellitus with other specified complication: Secondary | ICD-10-CM | POA: Diagnosis not present

## 2023-05-25 NOTE — Progress Notes (Unsigned)
Enrolled patient for a 7 day Zio XT monitor to be mailed to patients home.  

## 2023-05-25 NOTE — Patient Instructions (Signed)
Medication Instructions:  Your physician recommends that you continue on your current medications as directed. Please refer to the Current Medication list given to you today.  *If you need a refill on your cardiac medications before your next appointment, please call your pharmacy*   Lab Work: BNP   If you have labs (blood work) drawn today and your tests are completely normal, you will receive your results only by: MyChart Message (if you have MyChart) OR A paper copy in the mail If you have any lab test that is abnormal or we need to change your treatment, we will call you to review the results.   Testing/Procedures: Your physician has requested that you wear a heart monitor.   Follow-Up: At [facility], you and your health needs are our priority.  As part of our continuing mission to provide you with exceptional heart care, we have created designated Provider Care Teams.  These Care Teams include your primary Cardiologist (physician) and Advanced Practice Providers (APPs -  Physician Assistants and Nurse Practitioners) who all work together to provide you with the care you need, when you need it.   Your next appointment:   2-3 month(s)  Provider:   Jari Favre, PA-C       Other Instructions ZIO XT- Long Term Monitor Instructions  Your physician has requested you wear a ZIO patch monitor for 7 days.  This is a single patch monitor. [employer] supplies one patch monitor per enrollment. Additional stickers are not available. Please do not apply patch if you will be having a Nuclear Stress Test,  Echocardiogram, Cardiac CT, MRI, or Chest Xray during the period you would be wearing the  monitor. The patch cannot be worn during these tests. You cannot remove and re-apply the  ZIO XT patch monitor.  Your ZIO patch monitor will be mailed 3 day USPS to your address on file. It may take 3-5 days  to receive your monitor after you have been enrolled.  Once you have received  your monitor, please review the enclosed instructions. Your monitor  has already been registered assigning a specific monitor serial # to you.  Billing and Patient Assistance Program Information  We have supplied [employer] with any of your insurance information on file for billing purposes. [employer] offers a sliding scale Patient Assistance Program for patients that do not have  insurance, or whose insurance does not completely cover the cost of the ZIO monitor.  You must apply for the Patient Assistance Program to qualify for this discounted rate.  To apply, please call [employer] at [phone], select option 4, select option 2, ask to apply for  Patient Assistance Program. Meredeth Ide will ask your household income, and how many people  are in your household. They will quote your out-of-pocket cost based on that information.  [employer] will also be able to set up a 8-month, interest-free payment plan if needed.  Applying the monitor   Shave hair from upper left chest.  Hold abrader disc by orange tab. Rub abrader in 40 strokes over the upper left chest as  indicated in your monitor instructions.  Clean area with 4 enclosed alcohol pads. Let dry.  Apply patch as indicated in monitor instructions. Patch will be placed under collarbone on left  side of chest with arrow pointing upward.  Rub patch adhesive wings for 2 minutes. Remove Casali label marked "1". Remove the Schnarr  label marked "2". Rub patch adhesive wings for 2 additional minutes.  While looking in  a mirror, press and release button in center of patch. A small green light will  flash 3-4 times. This will be your only indicator that the monitor has been turned on.  Do not shower for the first 24 hours. You may shower after the first 24 hours.  Press the button if you feel a symptom. You will hear a small click. Record Date, Time and  Symptom in the Patient Logbook.  When you are ready to remove the patch, follow instructions on the last  2 pages of Patient  Logbook. Stick patch monitor onto the last page of Patient Logbook.  Place Patient Logbook in the blue and Balke box. Use locking tab on box and tape box closed  securely. The blue and Klaus box has prepaid postage on it. Please place it in the mailbox as  soon as possible. Your physician should have your test results approximately 7 days after the  monitor has been mailed back to [facility].  Call [facility] Customer Care at [phone] if you have questions regarding  your ZIO XT patch monitor. Call them immediately if you see an orange light blinking on your  monitor.  If your monitor falls off in less than 4 days, contact our Monitor department at [phone].  If your monitor becomes loose or falls off after 4 days call [employer] at [phone] for  suggestions on securing your monitor

## 2023-05-26 ENCOUNTER — Telehealth: Payer: Self-pay

## 2023-05-26 DIAGNOSIS — I10 Essential (primary) hypertension: Secondary | ICD-10-CM

## 2023-05-26 DIAGNOSIS — R0609 Other forms of dyspnea: Secondary | ICD-10-CM

## 2023-05-26 LAB — PRO B NATRIURETIC PEPTIDE: NT-Pro BNP: 708 pg/mL — ABNORMAL HIGH (ref 0–486)

## 2023-05-26 MED ORDER — FUROSEMIDE 20 MG PO TABS: ORAL_TABLET | ORAL | 11 refills | Status: DC

## 2023-05-26 NOTE — Telephone Encounter (Signed)
The patient has been notified of the result and verbalized understanding.  All questions (if any) were answered. Macie Burows, RN 05/26/2023 3:22 PM   Reviewed with pt the reasons to take medication: swelling, wt gain, SOB.  Pt will come in for labs on 06/09/23.

## 2023-05-26 NOTE — Telephone Encounter (Signed)
-----   Message from [name] sent at 05/26/2023  7:55 AM EDT ----- Results: BNP elevated with symptoms of volume overload Plan: lasix 20 mg PO daily for 5 day then PRN, Repeat BNP and BMP in one-two weeks  [name], MD

## 2023-05-27 DIAGNOSIS — I10 Essential (primary) hypertension: Secondary | ICD-10-CM

## 2023-05-27 DIAGNOSIS — I480 Paroxysmal atrial fibrillation: Secondary | ICD-10-CM | POA: Diagnosis not present

## 2023-05-27 DIAGNOSIS — I2510 Atherosclerotic heart disease of native coronary artery without angina pectoris: Secondary | ICD-10-CM

## 2023-05-30 ENCOUNTER — Encounter: Payer: Self-pay | Admitting: Internal Medicine

## 2023-06-04 DIAGNOSIS — G4733 Obstructive sleep apnea (adult) (pediatric): Secondary | ICD-10-CM | POA: Diagnosis not present

## 2023-06-08 DIAGNOSIS — I2510 Atherosclerotic heart disease of native coronary artery without angina pectoris: Secondary | ICD-10-CM | POA: Diagnosis not present

## 2023-06-08 DIAGNOSIS — I480 Paroxysmal atrial fibrillation: Secondary | ICD-10-CM | POA: Diagnosis not present

## 2023-06-09 ENCOUNTER — Ambulatory Visit: Payer: Medicare HMO | Attending: Internal Medicine

## 2023-06-09 DIAGNOSIS — R0609 Other forms of dyspnea: Secondary | ICD-10-CM

## 2023-06-09 DIAGNOSIS — I10 Essential (primary) hypertension: Secondary | ICD-10-CM | POA: Diagnosis not present

## 2023-06-10 ENCOUNTER — Encounter: Payer: Self-pay | Admitting: Internal Medicine

## 2023-06-10 ENCOUNTER — Encounter: Payer: Medicare HMO | Admitting: Pharmacist

## 2023-06-10 DIAGNOSIS — I10 Essential (primary) hypertension: Secondary | ICD-10-CM

## 2023-06-10 DIAGNOSIS — R0609 Other forms of dyspnea: Secondary | ICD-10-CM

## 2023-06-10 LAB — BASIC METABOLIC PANEL
BUN/Creatinine Ratio: 21 (ref 10–24)
BUN: 21 mg/dL (ref 8–27)
CO2: 23 mmol/L (ref 20–29)
Calcium: 9.1 mg/dL (ref 8.6–10.2)
Chloride: 105 mmol/L (ref 96–106)
Creatinine, Ser: 1.01 mg/dL (ref 0.76–1.27)
Glucose: 148 mg/dL — ABNORMAL HIGH (ref 70–99)
Potassium: 4.7 mmol/L (ref 3.5–5.2)
Sodium: 141 mmol/L (ref 134–144)
eGFR: 73 mL/min/{1.73_m2} (ref >59)

## 2023-06-10 LAB — PRO B NATRIURETIC PEPTIDE: NT-Pro BNP: 810 pg/mL — ABNORMAL HIGH (ref 0–486)

## 2023-06-15 NOTE — Telephone Encounter (Signed)
Called pt reviewed results:Results: Increase in duration of AF with DOE and elevated BNP Plan: Lasix 20 mg PO daily, AF clinic with BMP at this visit  Advised pt a scheduler from the Afib clinic will call to set up OV pt will need BMP at OV.  All questions answered.

## 2023-06-17 ENCOUNTER — Other Ambulatory Visit (HOSPITAL_COMMUNITY): Payer: Self-pay | Admitting: Physician Assistant

## 2023-06-17 ENCOUNTER — Encounter (HOSPITAL_COMMUNITY): Payer: Self-pay | Admitting: Physician Assistant

## 2023-06-17 ENCOUNTER — Ambulatory Visit (HOSPITAL_COMMUNITY)
Admission: RE | Admit: 2023-06-17 | Discharge: 2023-06-17 | Disposition: A | Discharge status: Home / Self Care | Payer: Medicare HMO | Source: Ambulatory Visit | Attending: Physician Assistant | Admitting: Physician Assistant

## 2023-06-17 VITALS — BP 128/80 | HR 95 | Ht 72.0 in | Wt 241.4 lb

## 2023-06-17 DIAGNOSIS — I4819 Other persistent atrial fibrillation: Secondary | ICD-10-CM | POA: Diagnosis not present

## 2023-06-17 DIAGNOSIS — R9431 Abnormal electrocardiogram [ECG] [EKG]: Secondary | ICD-10-CM | POA: Diagnosis not present

## 2023-06-17 DIAGNOSIS — Z79899 Other long term (current) drug therapy: Secondary | ICD-10-CM | POA: Insufficient documentation

## 2023-06-17 DIAGNOSIS — I4891 Unspecified atrial fibrillation: Secondary | ICD-10-CM | POA: Diagnosis not present

## 2023-06-17 DIAGNOSIS — D6869 Other thrombophilia: Secondary | ICD-10-CM | POA: Insufficient documentation

## 2023-06-17 DIAGNOSIS — G4733 Obstructive sleep apnea (adult) (pediatric): Secondary | ICD-10-CM | POA: Insufficient documentation

## 2023-06-17 DIAGNOSIS — I517 Cardiomegaly: Secondary | ICD-10-CM

## 2023-06-17 DIAGNOSIS — Z7901 Long term (current) use of anticoagulants: Secondary | ICD-10-CM | POA: Insufficient documentation

## 2023-06-17 DIAGNOSIS — I2510 Atherosclerotic heart disease of native coronary artery without angina pectoris: Secondary | ICD-10-CM | POA: Diagnosis not present

## 2023-06-17 DIAGNOSIS — I493 Ventricular premature depolarization: Secondary | ICD-10-CM | POA: Diagnosis not present

## 2023-06-17 LAB — BASIC METABOLIC PANEL
Anion gap: 11 (ref 5–15)
BUN: 20 mg/dL (ref 8–23)
CO2: 26 mmol/L (ref 22–32)
Calcium: 8.9 mg/dL (ref 8.9–10.3)
Chloride: 103 mmol/L (ref 98–111)
Creatinine, Ser: 1.15 mg/dL (ref 0.61–1.24)
GFR, Estimated: >60 mL/min (ref >60)
Glucose, Bld: 188 mg/dL — ABNORMAL HIGH (ref 70–99)
Potassium: 4.2 mmol/L (ref 3.5–5.1)
Sodium: 140 mmol/L (ref 135–145)

## 2023-06-17 NOTE — Addendum Note (Signed)
Encounter addended by: Learta Codding, CMA on: 06/17/2023 3:36 PM  Actions taken: Order Reconciliation Section accessed, Clinical Note Signed

## 2023-06-17 NOTE — Addendum Note (Signed)
Encounter addended by: Danice Goltz, PA on: 06/17/2023 3:00 PM  Actions taken: Clinical Note Signed

## 2023-06-17 NOTE — Progress Notes (Addendum)
Primary Care Physician: Brent Dark, MD Primary Cardiologist: Brent Constant, MD Electrophysiologist: None  Sleep Medicine: Dr Mayford Knife Referring Physician: Dr Brent Taylor is a 82 y.o. male with a history of CAD, HLD, OSA, DM, atrial fibrillation who presents for follow up in the [facility]. Patient is on Eliquis for a CHADS2VASC score of 5. The patient was seen by Dr Brent Taylor on 05/25/23 with increased DOE and lower extremity edema. He was in afib at that time. A Zio monitor was placed which showed 100% afib burden. His last ECG in SR was January of 2023.  On follow up today, patient reports that he has felt more fatigued and SOB with exertion over the past 2-3 months. His symptoms have improved since starting Lasix. He remains in afib today.   Today, he denies symptoms of palpitations, chest pain, orthopnea, PND, dizziness, presyncope, syncope, bleeding, or neurologic sequela. The patient is tolerating medications without difficulties and is otherwise without complaint today.    Atrial Fibrillation Risk Factors:  he does have symptoms or diagnosis of sleep apnea. he is compliant with CPAP therapy. he does not have a history of rheumatic fever.   Atrial Fibrillation Management history:  Previous antiarrhythmic drugs: none Previous cardioversions: none Previous ablations: none Anticoagulation history: Eliquis  ROS- All systems are reviewed and negative except as per the HPI above.  Past Medical History:  Diagnosis Date   Atrial fibrillation (HCC)    Cancer (HCC)    hx - melanoma   Chicken pox    Colon polyps    Coronary artery disease    Depression    No need for therapy.  Improved   Diabetes (HCC)    Diverticula, colon    Duodenal nodule    Dyspnea    with exertion   GERD (gastroesophageal reflux disease)    Hemorrhoids    Hyperlipidemia    Hypertension    Sepsis (HCC)    Sepsis (HCC)    Sleep apnea     Tinnitus    UTI (lower urinary tract infection)     Current Outpatient Medications  Medication Sig Dispense Refill   acetaminophen (TYLENOL) 650 MG CR tablet Take 1,300 mg by mouth daily as needed for pain.     Ascorbic Acid (VITAMIN C PO) Take by mouth.     Blood Glucose Monitoring Suppl (ONE TOUCH ULTRA 2) w/Device KIT Use daily to check blood sugar. 1 kit 0   Cyanocobalamin (B-12 PO) Take 1 tablet by mouth daily.     ELIQUIS 5 MG TABS tablet TAKE 1 TABLET BY MOUTH TWICE A DAY 60 tablet 5   FLUoxetine (PROZAC) 40 MG capsule TAKE ONE CAPSULE BY MOUTH DAILY 90 capsule 3   fluticasone (FLONASE) 50 MCG/ACT nasal spray Place 1 spray into both nostrils daily as needed for allergies or rhinitis.     furosemide (LASIX) 20 MG tablet Take 1 tablet (20 mg total) by mouth daily for 5 days, THEN 1 tablet (20 mg total) daily as needed for edema. 30 tablet 11   Lancets (ONETOUCH ULTRASOFT) lancets USE TO TEST BLOOD SUGAR DAILY 100 each 12   losartan (COZAAR) 50 MG tablet TAKE 1 TABLET BY MOUTH DAILY 90 tablet 0   MAGNESIUM PO Take 1 tablet by mouth daily.     metoprolol tartrate (LOPRESSOR) 25 MG tablet TAKE 1/2 TABLET BY MOUTH TWICE A DAY 90 tablet 3   Omega-3 Fatty Acids (FISH OIL PO) Take  1 capsule by mouth daily.     ONETOUCH ULTRA TEST test strip USE TO TEST BLOOD SUGAR AS DIRECTED 100 strip 3   pantoprazole (PROTONIX) 40 MG tablet Take 1 tablet (40 mg total) by mouth daily. 90 tablet 3   pravastatin (PRAVACHOL) 80 MG tablet Take 1 tablet (80 mg total) by mouth every evening. 90 tablet 3   traMADol (ULTRAM) 50 MG tablet Take 50 mg by mouth daily as needed for moderate pain or severe pain.     No current facility-administered medications for this encounter.    Physical Exam: BP 128/80   Pulse 95   Ht 6' (1.829 m)   Wt 109.5 kg   BMI 32.74 kg/m   GEN: Well nourished, well developed in no acute distress NECK: No JVD; No carotid bruits CARDIAC: Irregularly irregular rate and rhythm, no  murmurs, rubs, gallops RESPIRATORY:  Clear to auscultation without rales, wheezing or rhonchi  ABDOMEN: Soft, non-tender, non-distended EXTREMITIES:  No edema; No deformity   Wt Readings from Last 3 Encounters:  06/17/23 109.5 kg  05/25/23 109 kg  05/18/23 109.8 kg     EKG today demonstrates  Afib, PVCs, slow R wave prog Vent. rate 95 BPM PR interval * ms QRS duration 108 ms QT/QTcB 380/477 ms  Echo 12/23/22 demonstrated   1. Left ventricular ejection fraction, by estimation, is 50 to 55%. The  left ventricle has low normal function. The left ventricle has no regional  wall motion abnormalities. There is mild concentric left ventricular  hypertrophy. Left ventricular diastolic parameters are indeterminate.   2. Right ventricular systolic function is normal. The right ventricular  size is normal. There is normal pulmonary artery systolic pressure.   3. Left atrial size was severely dilated.   4. Right atrial size was severely dilated.   5. The mitral valve is normal in structure. Mild mitral valve  regurgitation. No evidence of mitral stenosis.   6. The aortic valve is tricuspid. Aortic valve regurgitation is not  visualized. No aortic stenosis is present.   7. Aortic dilatation noted. There is moderate dilatation of the ascending  aorta, measuring 46 mm.   8. The inferior vena cava is normal in size with greater than 50%  respiratory variability, suggesting right atrial pressure of 3 mmHg.    CHA2DS2-VASc Score = 5  The patient's score is based upon: CHF History: 0 HTN History: 1 Diabetes History: 1 Stroke History: 0 Vascular Disease History: 1 Age Score: 2 Gender Score: 0       ASSESSMENT AND PLAN: Persistent Atrial Fibrillation (ICD10:  I48.19) The patient's CHA2DS2-VASc score is 5, indicating a 7.2% annual risk of stroke.   Patient in persistent afib. We discussed rate vs rhythm control today. Would like to attempt at least a trial of SR to see if his symptoms  improve. We specifically discussed DCCV alone or with AAD (dofetilide, amiodarone). Suspect he will need AAD given his severe biatrial enlargement. He would like to discuss with his wife before making a decision.  Continue Eliquis 5 mg BID Continue Lopressor 12.5 mg BID  Secondary Hypercoagulable State (ICD10:  D68.69) The patient is at significant risk for stroke/thromboembolism based upon his CHA2DS2-VASc Score of 5.  Continue Apixaban (Eliquis).   OSA  Encouraged nightly CPAP The importance of adequate treatment of sleep apnea was discussed today in order to improve our ability to maintain sinus rhythm long term. Followed by Dr Mayford Knife  CAD On statin No anginal symptoms  Follow up pending his decision regarding AAD/DCCV.      Addendum: Patient would like to try DCCV alone, will arrange.  Informed Consent   Shared Decision Making/Informed Consent The risks (stroke, cardiac arrhythmias rarely resulting in the need for a temporary or permanent pacemaker, skin irritation or burns and complications associated with conscious sedation including aspiration, arrhythmia, respiratory failure and death), benefits (restoration of normal sinus rhythm) and alternatives of a direct current cardioversion were explained in detail to Mr. Goedecke and he agrees to proceed.      Jorja Loa PA-C Afib Clinic [facility] [address] [place], [place] [address] [phone]

## 2023-06-17 NOTE — Patient Instructions (Addendum)
Cardioversion scheduled for: September. 11th 2024   - Arrive at the [facility] and go to admitting at 9:30am   - Do not eat or drink anything after midnight the night prior to your procedure.   - Take all your morning medication (except diabetic medications) with a sip of water prior to arrival.  - You will not be able to drive home after your procedure.    - Do NOT miss any doses of your blood thinner - if you should miss a dose please notify our office immediately.   - If you feel as if you go back into normal rhythm prior to scheduled cardioversion, please notify our office immediately.   If your procedure is canceled in the cardioversion suite you will be charged a cancellation fee.

## 2023-06-20 ENCOUNTER — Ambulatory Visit (HOSPITAL_COMMUNITY)
Admission: RE | Admit: 2023-06-20 | Discharge: 2023-06-20 | Disposition: A | Discharge status: Home / Self Care | Payer: Medicare HMO | Source: Ambulatory Visit | Attending: Physician Assistant | Admitting: Physician Assistant

## 2023-06-20 DIAGNOSIS — I4891 Unspecified atrial fibrillation: Secondary | ICD-10-CM | POA: Diagnosis not present

## 2023-06-20 LAB — CBC
HCT: 40.6 % (ref 39.0–52.0)
Hemoglobin: 10.6 g/dL — ABNORMAL LOW (ref 13.0–17.0)
MCH: 21.1 pg — ABNORMAL LOW (ref 26.0–34.0)
MCHC: 26.1 g/dL — ABNORMAL LOW (ref 30.0–36.0)
MCV: 80.9 fL (ref 80.0–100.0)
Platelets: 237 10*3/uL (ref 150–400)
RBC: 5.02 MIL/uL (ref 4.22–5.81)
RDW: 18.8 % — ABNORMAL HIGH (ref 11.5–15.5)
WBC: 5.8 10*3/uL (ref 4.0–10.5)
nRBC: 0 % (ref 0.0–0.2)

## 2023-06-21 ENCOUNTER — Encounter: Payer: Self-pay | Admitting: Family Medicine

## 2023-06-21 DIAGNOSIS — Z85828 Personal history of other malignant neoplasm of skin: Secondary | ICD-10-CM | POA: Diagnosis not present

## 2023-06-21 DIAGNOSIS — D485 Neoplasm of uncertain behavior of skin: Secondary | ICD-10-CM | POA: Diagnosis not present

## 2023-06-21 DIAGNOSIS — C44622 Squamous cell carcinoma of skin of right upper limb, including shoulder: Secondary | ICD-10-CM | POA: Diagnosis not present

## 2023-06-21 DIAGNOSIS — L570 Actinic keratosis: Secondary | ICD-10-CM | POA: Diagnosis not present

## 2023-06-21 DIAGNOSIS — C44229 Squamous cell carcinoma of skin of left ear and external auricular canal: Secondary | ICD-10-CM | POA: Diagnosis not present

## 2023-06-21 DIAGNOSIS — C44399 Other specified malignant neoplasm of skin of other parts of face: Secondary | ICD-10-CM | POA: Diagnosis not present

## 2023-06-21 NOTE — Telephone Encounter (Signed)
Please see result note. Please have him schedule a visit soon.  Katina Degree. Jimmey Ralph, MD 06/21/2023 12:22 PM

## 2023-06-21 NOTE — Telephone Encounter (Signed)
Please advise 

## 2023-06-22 ENCOUNTER — Ambulatory Visit (HOSPITAL_COMMUNITY): Admission: RE | Admit: 2023-06-22 | Payer: Medicare HMO | Source: Home / Self Care | Admitting: Cardiovascular Disease

## 2023-06-22 ENCOUNTER — Encounter (HOSPITAL_COMMUNITY): Admission: RE | Payer: Self-pay | Source: Home / Self Care

## 2023-06-22 ENCOUNTER — Encounter: Payer: Self-pay | Admitting: Internal Medicine

## 2023-06-22 SURGERY — CARDIOVERSION
Anesthesia: General

## 2023-06-22 NOTE — Telephone Encounter (Signed)
Please call patient and schedule an appointment with Dr. Jimmey Ralph ASAP.

## 2023-06-23 NOTE — Telephone Encounter (Signed)
LVM to schedule first aval appt.

## 2023-06-27 ENCOUNTER — Other Ambulatory Visit: Payer: Medicare HMO

## 2023-06-27 ENCOUNTER — Ambulatory Visit (INDEPENDENT_AMBULATORY_CARE_PROVIDER_SITE_OTHER): Payer: Medicare HMO | Admitting: Family Medicine

## 2023-06-27 VITALS — BP 126/68 | HR 82 | Temp 97.5°F | Ht 72.0 in | Wt 239.2 lb

## 2023-06-27 DIAGNOSIS — I10 Essential (primary) hypertension: Secondary | ICD-10-CM

## 2023-06-27 DIAGNOSIS — I152 Hypertension secondary to endocrine disorders: Secondary | ICD-10-CM | POA: Diagnosis not present

## 2023-06-27 DIAGNOSIS — D649 Anemia, unspecified: Secondary | ICD-10-CM

## 2023-06-27 DIAGNOSIS — E1159 Type 2 diabetes mellitus with other circulatory complications: Secondary | ICD-10-CM | POA: Diagnosis not present

## 2023-06-27 DIAGNOSIS — I480 Paroxysmal atrial fibrillation: Secondary | ICD-10-CM | POA: Diagnosis not present

## 2023-06-27 DIAGNOSIS — Z23 Encounter for immunization: Secondary | ICD-10-CM | POA: Diagnosis not present

## 2023-06-27 LAB — COMPREHENSIVE METABOLIC PANEL
ALT: 15 U/L (ref 0–53)
AST: 18 U/L (ref 0–37)
Albumin: 3.9 g/dL (ref 3.5–5.2)
Alkaline Phosphatase: 54 U/L (ref 39–117)
BUN: 26 mg/dL — ABNORMAL HIGH (ref 6–23)
CO2: 27 meq/L (ref 19–32)
Calcium: 8.9 mg/dL (ref 8.4–10.5)
Chloride: 103 meq/L (ref 96–112)
Creatinine, Ser: 1.13 mg/dL (ref 0.40–1.50)
GFR: 59.64 mL/min — ABNORMAL LOW (ref >60.00)
Glucose, Bld: 164 mg/dL — ABNORMAL HIGH (ref 70–99)
Potassium: 4.8 meq/L (ref 3.5–5.1)
Sodium: 138 mEq/L (ref 135–145)
Total Bilirubin: 0.6 mg/dL (ref 0.2–1.2)
Total Protein: 6.6 g/dL (ref 6.0–8.3)

## 2023-06-27 LAB — CBC
HCT: 35.6 % — ABNORMAL LOW (ref 38.5–50.0)
Hemoglobin: 9.9 g/dL — ABNORMAL LOW (ref 13.2–17.1)
MCH: 20.8 pg — ABNORMAL LOW (ref 27.0–33.0)
MCHC: 27.8 g/dL — ABNORMAL LOW (ref 32.0–36.0)
MCV: 74.6 fL — ABNORMAL LOW (ref 80.0–100.0)
MPV: 9.8 fL (ref 7.5–12.5)
Platelets: 298 10*3/uL (ref 140–400)
RBC: 4.77 10*6/uL (ref 4.20–5.80)
RDW: 16.4 % — ABNORMAL HIGH (ref 11.0–15.0)
WBC: 6 10*3/uL (ref 3.8–10.8)

## 2023-06-27 LAB — IBC + FERRITIN
Ferritin: 10.5 ng/mL — ABNORMAL LOW (ref 22.0–322.0)
Iron: 37 ug/dL — ABNORMAL LOW (ref 42–165)
Saturation Ratios: 10.2 % — ABNORMAL LOW (ref 20.0–50.0)
TIBC: 362.6 ug/dL (ref 250.0–450.0)
Transferrin: 259 mg/dL (ref 212.0–360.0)

## 2023-06-27 LAB — VITAMIN B12: Vitamin B-12: 548 pg/mL (ref 211–911)

## 2023-06-27 NOTE — Patient Instructions (Signed)
It was very nice to see you today!  We will check blood work today.  If you are low in iron we will need to have you follow-up with the gastroenterologist to look for any signs of internal bleeding.  Return if symptoms worsen or fail to improve.   Take care, Dr Jimmey Ralph  PLEASE NOTE:  If you had any lab tests, please let us know if you have not heard back within a few days. You may see your results on mychart before we have a chance to review them but we will give you a call once they are reviewed by [place].   If we ordered any referrals today, please let us know if you have not heard from their office within the next week.   If you had any urgent prescriptions sent in today, please check with the pharmacy within an hour of our visit to make sure the prescription was transmitted appropriately.   Please try these tips to maintain a healthy lifestyle:  Eat at least 3 REAL meals and 1-2 snacks per day.  Aim for no more than 5 hours between eating.  If you eat breakfast, please do so within one hour of getting up.   Each meal should contain half fruits/vegetables, one quarter protein, and one quarter carbs (no bigger than a computer mouse)  Cut down on sweet beverages. This includes juice, soda, and sweet tea.   Drink at least 1 glass of water with each meal and aim for at least 8 glasses per day  Exercise at least 150 minutes every week.

## 2023-06-27 NOTE — Assessment & Plan Note (Signed)
Following with cardiology.  Cardioversion had to be postponed due to his anemia.  He is anticoagulated on Eliquis 5 mg twice daily and rate control metoprolol tartrate 12.5 mg twice daily.

## 2023-06-27 NOTE — Assessment & Plan Note (Signed)
Blood pressure at goal today on metoprolol tartrate 12.5 mg twice daily and losartan 50 mg daily.

## 2023-06-27 NOTE — Progress Notes (Signed)
   Brent Taylor is a 82 y.o. male who presents today for an office visit.  Assessment/Plan:  New/Acute Problems: Anemia  Most recent hemoglobin 10.6.  Vital signs are within normal ranges -do not think we need urgent evaluation at this time however we do need to rule out GI blood losses.  Will recheck CBC today.  Check iron panel and B12.  If confirmed to have iron deficiency anemia will need to go back to GI for further evaluation.  He did have a food impaction a few months ago and underwent endoscopic evaluation at that time.  He has been on Protonix 40 mg daily for GERD.   Chronic Problems Addressed Today: Atrial fibrillation [facility]) Following with cardiology.  Cardioversion had to be postponed due to his anemia.  He is anticoagulated on Eliquis 5 mg twice daily and rate control metoprolol tartrate 12.5 mg twice daily.  Hypertension associated with diabetes (HCC) Blood pressure at goal today on metoprolol tartrate 12.5 mg twice daily and losartan 50 mg daily.  Flu shot given today.    Subjective:  HPI:  See A/P for status of chronic conditions.  Patient is here today for follow-up.  He was last here a couple of months ago.  Since her last visit he has been following with cardiology for atrial fibrillation.  Was incidentally found to have decreasing hemoglobin which had trended from 12.7-10.6 while on Eliquis.  He was planning on to have cardioversion performed however this was canceled due to decreasing hemoglobin.  He does feel little more tired and rundown than usual.  He is recently started taking his B12.  Has not noticed any melena or hematochezia.  No hematuria.  No overt signs of bleeding.       Objective:  Physical Exam: BP 126/68   Pulse 82   Temp (!) 97.5 F (36.4 C) (Temporal)   Ht 6' (1.829 m)   Wt 239 lb 3.2 oz (108.5 kg)   SpO2 98%   BMI 32.44 kg/m   Gen: No acute distress, resting comfortably CV: Regular rate and rhythm with no murmurs appreciated Pulm: Normal  work of breathing, clear to auscultation bilaterally with no crackles, wheezes, or rhonchi Neuro: Grossly normal, moves all extremities Psych: Normal affect and thought content      Brent Folta M. Jimmey Ralph, MD 06/27/2023 12:41 PM

## 2023-06-28 ENCOUNTER — Encounter: Payer: Self-pay | Admitting: Family Medicine

## 2023-06-28 NOTE — Progress Notes (Signed)
His blood work confirms that he has iron deficiency.  Based on labs look like he is probably losing blood from an upper GI source.  We need to make sure that he sees GI soon.  Please make sure that he is taking his Protonix 40 mg daily and have him call to schedule appointment with GI ASAP.  He should start taking an iron supplement with ferrous sulfate 65 mg every other day

## 2023-06-29 ENCOUNTER — Telehealth: Payer: Self-pay | Admitting: Internal Medicine

## 2023-06-29 NOTE — Telephone Encounter (Signed)
Inbound call from patient requesting a call regarding previous note. Please advise, thank you.

## 2023-06-29 NOTE — Telephone Encounter (Signed)
Pt scheduled to see Quentin Mulling PA 07/07/23 at 1:30pm. Pt aware of appt.

## 2023-06-29 NOTE — Telephone Encounter (Signed)
Received MyChart message from patient.  He said Dr. Jimmey Ralph suggested he see Dr. Marina Goodell as soon as possible as he may be bleeding from his upper GI tract.  Please call patient and advise.  Thank you.

## 2023-07-05 ENCOUNTER — Ambulatory Visit (HOSPITAL_COMMUNITY): Payer: Medicare HMO | Admitting: Physician Assistant

## 2023-07-05 DIAGNOSIS — G4733 Obstructive sleep apnea (adult) (pediatric): Secondary | ICD-10-CM | POA: Diagnosis not present

## 2023-07-06 ENCOUNTER — Other Ambulatory Visit: Payer: Self-pay | Admitting: Family Medicine

## 2023-07-06 NOTE — Progress Notes (Unsigned)
07/07/2023 [name] [id number] 1941-06-15  Referring provider: [name], MD Primary GI doctor: Dr. Marina Goodell  ASSESSMENT AND PLAN:      Iron Deficiency Anemia Significant drop in hemoglobin over the past 8-9 months. No overt GI symptoms. On iron supplementation every other day. Fecal occult blood test with faint positive result. -Schedule upper endoscopy and colonoscopy with Dr. Marina Goodell, should be appropriate at [facility] -Obtain permission from cardiologist to hold Eliquis for 2 days prior to procedures. -Repeat CBC, iron, and ferritin in 1-2 weeks to assess response to iron supplementation. -Consider capsule endoscopy if endoscopy and colonoscopy are negative and anemia persists. I recommend upper gastrointestinal and colorectal evaluation with an EGD and colonoscopy.  Risk of bowel prep, conscious sedation, and EGD and colonoscopy were discussed.  Risks include but are not limited to dehydration, pain, bleeding, cardiopulmonary process, bowel perforation, or other possible adverse outcomes..  Treatment plan was discussed with patient, and agreed upon.   Atrial Fibrillation On Eliquis for stroke prevention. Recent increase in AFib burden noted on heart monitor. -Continue Eliquis as prescribed, holding for 2 days prior to endoscopy and colonoscopy with cardiologist's approval.  Hiatal Hernia Large hiatal hernia noted on previous endoscopy. No current symptoms. -Continue Pantoprazole 40mg  daily. -Reevaluate during upcoming endoscopy to rule out cameron lesions, etc.       Patient Care Team: [name], MD as PCP - General (Family Medicine) [name], MD as PCP - Cardiology (Cardiology) Quintella Reichert, MD as PCP - Sleep Medicine (Cardiology) Dahlia Byes, [facility] (Inactive) (Pharmacist) Dahlia Byes, [facility] (Inactive) as Pharmacist (Pharmacist)  HISTORY OF PRESENT ILLNESS: 82 y.o. male with a past medical history of type 2 diabetes, hypertension,  hyperlipidemia, atrial fibrillation on Eliquis, CAD, OSA, GERD, history of food impaction, CKD, history of melanoma without metastasis, history of colonic polyps, B12 and others listed below presents for evaluation of IDA.   10/11/2009 unremarkable colonoscopy 02/05/2023 EGD Dr. Orvan Falconer in the ER for food impaction 03/17/2023 EGD Dr. Marina Goodell distal esophageal stricture dilated to 19 mm, torturous esophagus, large hiatal hernia, benign-appearing 13 mm firm submucosal lesion  06/27/2023  HGB 9.9 (10.6 on 09/09, 12.7 on 04/26, 14.1 on 01/16) MCV 74.6 microcyctic Platelets 298 06/27/2023 Iron 37 Ferritin 10.5 B12 548 Started on Protonix 40 mg daily and started on ferrous sulfate 65 mg every other day 06/27/2023.  Recent Labs    10/26/22 0838 02/04/23 2028 06/20/23 1113 06/27/23 1658  HGB 14.1 12.7* 10.6* 9.9*   Discussed the use of AI scribe software for clinical note transcription with the patient, who gave verbal consent to proceed.  The patient, with a history of atrial fibrillation, hiatal hernia, and recent esophageal dilation, presents for evaluation of anemia.  They were found to be anemic during preoperative workup for a planned cardioversion for atrial fibrillation. Their hemoglobin has dropped four grams since the beginning of the year.  Denies melena, hematochezia, GERD, dysphagia, AB pain.  No changes in his bowel habits.  They do report some shortness of breath with exertion. They have not noticed any significant weight loss. They have a history of back surgery and neuropathy in their legs and feet, for which they have been receiving treatment.     He  reports that he has quit smoking. His smoking use included cigarettes. He has never used smokeless tobacco. He reports that he does not drink alcohol and does not use drugs.  RELEVANT LABS AND IMAGING: CBC    Component Value  Date/Time   WBC 6.0 06/27/2023 1658   RBC 4.77 06/27/2023 1658   HGB 9.9 (L) 06/27/2023 1658   HCT 35.6 (L)  06/27/2023 1658   PLT 298 06/27/2023 1658   MCV 74.6 (L) 06/27/2023 1658   MCH 20.8 (L) 06/27/2023 1658   MCHC 27.8 (L) 06/27/2023 1658   RDW 16.4 (H) 06/27/2023 1658   LYMPHSABS 1.2 05/07/2021 1834   MONOABS 0.7 05/07/2021 1834   EOSABS 0.2 05/07/2021 1834   BASOSABS 0.0 05/07/2021 1834   Recent Labs    10/26/22 0838 02/04/23 2028 06/20/23 1113 06/27/23 1658  HGB 14.1 12.7* 10.6* 9.9*    CMP     Component Value Date/Time   NA 138 06/27/2023 1219   NA 141 06/09/2023 0859   K 4.8 06/27/2023 1219   CL 103 06/27/2023 1219   CO2 27 06/27/2023 1219   GLUCOSE 164 (H) 06/27/2023 1219   BUN 26 (H) 06/27/2023 1219   BUN 21 06/09/2023 0859   CREATININE 1.13 06/27/2023 1219   CALCIUM 8.9 06/27/2023 1219   PROT 6.6 06/27/2023 1219   PROT 6.6 12/22/2022 0950   ALBUMIN 3.9 06/27/2023 1219   AST 18 06/27/2023 1219   ALT 15 06/27/2023 1219   ALKPHOS 54 06/27/2023 1219   BILITOT 0.6 06/27/2023 1219   GFRNONAA >60 06/17/2023 1100   GFRAA >60 04/25/2016 1345      Latest Ref Rng & Units 06/27/2023   12:19 PM 02/04/2023    8:28 PM 12/22/2022    9:50 AM  Hepatic Function  Total Protein 6.0 - 8.3 g/dL 6.6  7.1  6.6   Albumin 3.5 - 5.2 g/dL 3.9  4.4    AST 0 - 37 U/L 18  23    ALT 0 - 53 U/L 15  12    Alk Phosphatase 39 - 117 U/L 54  56    Total Bilirubin 0.2 - 1.2 mg/dL 0.6  0.8    Bilirubin, Direct 0.0 - 0.2 mg/dL  0.2        Current Medications:    Current Outpatient Medications (Cardiovascular):    furosemide (LASIX) 20 MG tablet, Take 1 tablet (20 mg total) by mouth daily for 5 days, THEN 1 tablet (20 mg total) daily as needed for edema. (Patient taking differently: Take 1 tablet (20 mg total) daily in the morning.)   losartan (COZAAR) 50 MG tablet, TAKE 1 TABLET BY MOUTH DAILY   metoprolol tartrate (LOPRESSOR) 25 MG tablet, TAKE 1/2 TABLET BY MOUTH TWICE A DAY   pravastatin (PRAVACHOL) 80 MG tablet, Take 1 tablet (80 mg total) by mouth every evening.  Current Outpatient  Medications (Respiratory):    fluticasone (FLONASE) 50 MCG/ACT nasal spray, Place 1 spray into both nostrils daily as needed for allergies or rhinitis.  Current Outpatient Medications (Analgesics):    acetaminophen (TYLENOL) 650 MG CR tablet, Take 650-1,300 mg by mouth daily as needed for pain.   traMADol (ULTRAM) 50 MG tablet, Take 50 mg by mouth daily as needed for moderate pain or severe pain.  Current Outpatient Medications (Hematological):    Cyanocobalamin (B-12 PO), Take 1 tablet by mouth daily.   ELIQUIS 5 MG TABS tablet, TAKE 1 TABLET BY MOUTH TWICE A DAY   ferrous sulfate 325 (65 FE) MG EC tablet, Take 325 mg by mouth every other day.  Current Outpatient Medications (Other):    Ascorbic Acid (VITAMIN C PO), Take by mouth.   Blood Glucose Monitoring Suppl (ONE TOUCH ULTRA 2) w/Device KIT,  Use daily to check blood sugar.   FLUoxetine (PROZAC) 40 MG capsule, TAKE 1 CAPSULE BY MOUTH DAILY   Lancets (ONETOUCH ULTRASOFT) lancets, USE TO TEST BLOOD SUGAR DAILY   MAGNESIUM PO, Take 1 tablet by mouth daily.   Na Sulfate-K Sulfate-Mg Sulf 17.5-3.13-1.6 GM/177ML SOLN, Take 1 kit by mouth once for 1 dose.   NON FORMULARY, Take 1 Dose by mouth in the morning and at bedtime. Neuropathy Blend Nerve Formula Liquid   Omega-3 Fatty Acids (FISH OIL PO), Take 1 capsule by mouth daily.   ONETOUCH ULTRA TEST test strip, USE TO TEST BLOOD SUGAR AS DIRECTED   pantoprazole (PROTONIX) 40 MG tablet, Take 1 tablet (40 mg total) by mouth daily.  Medical History:  Past Medical History:  Diagnosis Date   Atrial fibrillation (HCC)    Cancer (HCC)    hx - melanoma   Chicken pox    Colon polyps    Coronary artery disease    Depression    No need for therapy.  Improved   Diabetes (HCC)    Diverticula, colon    Duodenal nodule    Dyspnea    with exertion   GERD (gastroesophageal reflux disease)    Hemorrhoids    Hyperlipidemia    Hypertension    Sepsis (HCC)    Sepsis (HCC)    Sleep apnea     Tinnitus    UTI (lower urinary tract infection)    Allergies:  Allergies  Allergen Reactions   Gabapentin Other (See Comments)    Lethargic and depressed, aching   Lipitor [Atorvastatin Calcium]     Muscle ache     Surgical History:  He  has a past surgical history that includes Melanoma excision; Melanoma excision; Cardiac catheterization; Lumbar laminectomy/decompression microdiscectomy (N/A, 11/08/2022); Esophagogastroduodenoscopy (N/A, 02/05/2023); and Foreign Body Removal (02/05/2023). Family History:  His family history includes Heart Problems in his mother.  REVIEW OF SYSTEMS  : All other systems reviewed and negative except where noted in the History of Present Illness.  PHYSICAL EXAM: BP 100/70   Pulse 91   Ht 6' (1.829 m)   Wt 238 lb (108 kg)   BMI 32.28 kg/m  Physical Exam   MEASUREMENTS: WT- 238 NECK: No adenopathy palpated. Thyroid gland normal on palpation. CARDIOVASCULAR: Heart rhythm irregular, suggestive of atrial fibrillation. No murmurs detected. ABDOMEN: Bowel sounds normal on auscultation. Abdomen non-tender on palpation. Normal percussion. RECTAL: Redness and erythema around anal cleft. No skin tags, external hemorrhoids, or masses palpated. Stool brown, very faint positive for blood. Prostate normal on palpation.        Doree Albee, PA-C 2:27 PM

## 2023-07-07 ENCOUNTER — Ambulatory Visit: Payer: Medicare HMO | Admitting: Physician Assistant

## 2023-07-07 ENCOUNTER — Other Ambulatory Visit: Payer: Medicare HMO

## 2023-07-07 ENCOUNTER — Encounter: Payer: Self-pay | Admitting: Physician Assistant

## 2023-07-07 VITALS — BP 100/70 | HR 91 | Ht 72.0 in | Wt 238.0 lb

## 2023-07-07 DIAGNOSIS — Z7901 Long term (current) use of anticoagulants: Secondary | ICD-10-CM

## 2023-07-07 DIAGNOSIS — K449 Diaphragmatic hernia without obstruction or gangrene: Secondary | ICD-10-CM

## 2023-07-07 DIAGNOSIS — C439 Malignant melanoma of skin, unspecified: Secondary | ICD-10-CM

## 2023-07-07 DIAGNOSIS — D509 Iron deficiency anemia, unspecified: Secondary | ICD-10-CM

## 2023-07-07 DIAGNOSIS — I480 Paroxysmal atrial fibrillation: Secondary | ICD-10-CM | POA: Diagnosis not present

## 2023-07-07 DIAGNOSIS — K219 Gastro-esophageal reflux disease without esophagitis: Secondary | ICD-10-CM | POA: Diagnosis not present

## 2023-07-07 LAB — COMPREHENSIVE METABOLIC PANEL
ALT: 21 U/L (ref 0–53)
AST: 18 U/L (ref 0–37)
Albumin: 4.2 g/dL (ref 3.5–5.2)
Alkaline Phosphatase: 56 U/L (ref 39–117)
BUN: 22 mg/dL (ref 6–23)
CO2: 25 mEq/L (ref 19–32)
Calcium: 9.1 mg/dL (ref 8.4–10.5)
Chloride: 106 mEq/L (ref 96–112)
Creatinine, Ser: 1.06 mg/dL (ref 0.40–1.50)
GFR: 64.38 mL/min (ref >60.00)
Glucose, Bld: 108 mg/dL — ABNORMAL HIGH (ref 70–99)
Potassium: 4.2 mEq/L (ref 3.5–5.1)
Sodium: 141 mEq/L (ref 135–145)
Total Bilirubin: 0.4 mg/dL (ref 0.2–1.2)
Total Protein: 6.8 g/dL (ref 6.0–8.3)

## 2023-07-07 LAB — CBC WITH DIFFERENTIAL/PLATELET
Basophils Absolute: 0 10*3/uL (ref 0.0–0.1)
Basophils Relative: 0.5 % (ref 0.0–3.0)
Eosinophils Absolute: 0.1 10*3/uL (ref 0.0–0.7)
Eosinophils Relative: 1.8 % (ref 0.0–5.0)
HCT: 34.9 % — ABNORMAL LOW (ref 39.0–52.0)
Hemoglobin: 9.9 g/dL — ABNORMAL LOW (ref 13.0–17.0)
Lymphocytes Relative: 15.9 % (ref 12.0–46.0)
Lymphs Abs: 1.1 10*3/uL (ref 0.7–4.0)
MCHC: 28.4 g/dL — ABNORMAL LOW (ref 30.0–36.0)
MCV: 73.3 fl — ABNORMAL LOW (ref 78.0–100.0)
Monocytes Absolute: 0.8 10*3/uL (ref 0.1–1.0)
Monocytes Relative: 11.8 % (ref 3.0–12.0)
Neutro Abs: 4.7 10*3/uL (ref 1.4–7.7)
Neutrophils Relative %: 70 % (ref 43.0–77.0)
Platelets: 274 10*3/uL (ref 150.0–400.0)
RBC: 4.76 Mil/uL (ref 4.22–5.81)
RDW: 19.8 % — ABNORMAL HIGH (ref 11.5–15.5)
WBC: 6.6 10*3/uL (ref 4.0–10.5)

## 2023-07-07 MED ORDER — NA SULFATE-K SULFATE-MG SULF 17.5-3.13-1.6 GM/177ML PO SOLN: 1.0000 | Freq: Once | ORAL | 0 refills | Status: AC

## 2023-07-07 NOTE — Progress Notes (Signed)
Assessment plan reviewed. Suspect iron deficiency anemia is related to [facility] erosions (linear erosions on hernia sac noted on last endoscopy).  However, he has not had a colonoscopy in some time.  Agree with colonoscopy and upper endoscopy as planned.  He is high risk given his age and comorbidities.  Agree with plans to touch base with cardiology regarding anticoagulation. Dr. Marina Goodell

## 2023-07-07 NOTE — Addendum Note (Signed)
Addended by: Sunday Spillers on: 07/07/2023 04:12 PM   Modules accepted: Orders

## 2023-07-07 NOTE — Patient Instructions (Addendum)
You have been scheduled for a colonoscopy. Please follow written instructions given to you at your visit today.   Please pick up your prep supplies at the pharmacy within the next 1-3 days.  If you use inhalers (even only as needed), please bring them with you on the day of your procedure.  DO NOT TAKE 7 DAYS PRIOR TO TEST- Trulicity (dulaglutide) Ozempic, Wegovy (semaglutide) Mounjaro (tirzepatide) Bydureon Bcise (exanatide extended release)  DO NOT TAKE 1 DAY PRIOR TO YOUR TEST Rybelsus (semaglutide) Adlyxin (lixisenatide) Victoza (liraglutide) Byetta (exanatide) ___________________________________________________________________________  Your provider has requested that you go to the basement level for lab work before leaving today. Press "B" on the elevator. The lab is located at the first door on the left as you exit the elevator.   VISIT SUMMARY:  During your recent visit, we discussed your anemia, atrial fibrillation, hiatal hernia, and neuropathy. Your hemoglobin levels have dropped significantly over the past few months, which is a concern. We also noted an increase in your atrial fibrillation burden. However, your neuropathy seems to be improving with the current treatment.  YOUR PLAN:  -IRON DEFICIENCY ANEMIA: This is a condition where your body lacks enough iron to produce hemoglobin, the part of your red blood cells that carries oxygen. We will schedule an upper endoscopy and colonoscopy with Dr. Marina Goodell to investigate further. We will also repeat some blood tests in 1-2 weeks to see how you are responding to iron supplementation.  -ATRIAL FIBRILLATION: This is a heart condition that causes an irregular and often rapid heart rate. You should continue taking Eliquis as prescribed for stroke prevention, but we will need to hold it for 2 days prior to your endoscopy and colonoscopy procedures, with your cardiologist's approval.  -HIATAL HERNIA: This is a condition where part  of your stomach bulges up through your diaphragm into your chest. You should continue taking Pantoprazole daily, and we will reevaluate your condition during the upcoming endoscopy.  INSTRUCTIONS:  Please schedule an upper endoscopy and colonoscopy with Dr. Marina Goodell. Also, get permission from your cardiologist to hold Eliquis for 2 days prior to these procedures. We will repeat some blood tests in 1-2 weeks to see how you are responding to iron supplementation. If the endoscopy and colonoscopy do not reveal the cause of your anemia, we may consider a capsule endoscopy.  _______________________________________________________  If your blood pressure at your visit was 140/90 or greater, please contact your primary care physician to follow up on this.  _______________________________________________________  If you are age 47 or older, your body mass index should be between 23-30. Your Body mass index is 32.28 kg/m. If this is out of the aforementioned range listed, please consider follow up with your Primary Care Provider.  If you are age 43 or younger, your body mass index should be between 19-25. Your Body mass index is 32.28 kg/m. If this is out of the aformentioned range listed, please consider follow up with your Primary Care Provider.   ________________________________________________________  The [facility] providers would like to encourage you to use [facility] to communicate with providers for non-urgent requests or questions.  Due to long hold times on the telephone, sending your provider a message by [facility] may be a faster and more efficient way to get a response.  Please allow 48 business hours for a response.  Please remember that this is for non-urgent requests.  _______________________________________________________ It was a pleasure to see you today!  Thank you for trusting me with your  gastrointestinal care!

## 2023-07-08 ENCOUNTER — Ambulatory Visit (INDEPENDENT_AMBULATORY_CARE_PROVIDER_SITE_OTHER): Payer: Medicare HMO

## 2023-07-08 ENCOUNTER — Emergency Department (HOSPITAL_BASED_OUTPATIENT_CLINIC_OR_DEPARTMENT_OTHER)
Admission: EM | Admit: 2023-07-08 | Discharge: 2023-07-08 | Disposition: A | Discharge status: Home / Self Care | Payer: Medicare HMO | Attending: Emergency Medicine | Admitting: Emergency Medicine

## 2023-07-08 ENCOUNTER — Other Ambulatory Visit: Payer: Self-pay

## 2023-07-08 ENCOUNTER — Encounter (HOSPITAL_BASED_OUTPATIENT_CLINIC_OR_DEPARTMENT_OTHER): Payer: Self-pay

## 2023-07-08 ENCOUNTER — Emergency Department (HOSPITAL_BASED_OUTPATIENT_CLINIC_OR_DEPARTMENT_OTHER): Payer: Medicare HMO

## 2023-07-08 DIAGNOSIS — E119 Type 2 diabetes mellitus without complications: Secondary | ICD-10-CM | POA: Insufficient documentation

## 2023-07-08 DIAGNOSIS — I2510 Atherosclerotic heart disease of native coronary artery without angina pectoris: Secondary | ICD-10-CM | POA: Insufficient documentation

## 2023-07-08 DIAGNOSIS — N390 Urinary tract infection, site not specified: Secondary | ICD-10-CM | POA: Insufficient documentation

## 2023-07-08 DIAGNOSIS — I480 Paroxysmal atrial fibrillation: Secondary | ICD-10-CM | POA: Diagnosis not present

## 2023-07-08 DIAGNOSIS — Z85828 Personal history of other malignant neoplasm of skin: Secondary | ICD-10-CM | POA: Insufficient documentation

## 2023-07-08 DIAGNOSIS — I10 Essential (primary) hypertension: Secondary | ICD-10-CM | POA: Insufficient documentation

## 2023-07-08 DIAGNOSIS — Z79899 Other long term (current) drug therapy: Secondary | ICD-10-CM | POA: Diagnosis not present

## 2023-07-08 DIAGNOSIS — R0989 Other specified symptoms and signs involving the circulatory and respiratory systems: Secondary | ICD-10-CM | POA: Diagnosis not present

## 2023-07-08 DIAGNOSIS — U071 COVID-19: Secondary | ICD-10-CM | POA: Insufficient documentation

## 2023-07-08 DIAGNOSIS — Z7901 Long term (current) use of anticoagulants: Secondary | ICD-10-CM | POA: Diagnosis not present

## 2023-07-08 DIAGNOSIS — D509 Iron deficiency anemia, unspecified: Secondary | ICD-10-CM

## 2023-07-08 DIAGNOSIS — R5383 Other fatigue: Secondary | ICD-10-CM

## 2023-07-08 DIAGNOSIS — K449 Diaphragmatic hernia without obstruction or gangrene: Secondary | ICD-10-CM

## 2023-07-08 DIAGNOSIS — C439 Malignant melanoma of skin, unspecified: Secondary | ICD-10-CM | POA: Diagnosis not present

## 2023-07-08 DIAGNOSIS — K219 Gastro-esophageal reflux disease without esophagitis: Secondary | ICD-10-CM | POA: Diagnosis not present

## 2023-07-08 DIAGNOSIS — R0602 Shortness of breath: Secondary | ICD-10-CM | POA: Diagnosis not present

## 2023-07-08 LAB — CBC
HCT: 36.4 % — ABNORMAL LOW (ref 39.0–52.0)
Hemoglobin: 10 g/dL — ABNORMAL LOW (ref 13.0–17.0)
MCH: 20.8 pg — ABNORMAL LOW (ref 26.0–34.0)
MCHC: 27.5 g/dL — ABNORMAL LOW (ref 30.0–36.0)
MCV: 75.8 fL — ABNORMAL LOW (ref 80.0–100.0)
Platelets: 243 10*3/uL (ref 150–400)
RBC: 4.8 MIL/uL (ref 4.22–5.81)
RDW: 19.9 % — ABNORMAL HIGH (ref 11.5–15.5)
WBC: 12.7 10*3/uL — ABNORMAL HIGH (ref 4.0–10.5)
nRBC: 0 % (ref 0.0–0.2)

## 2023-07-08 LAB — URINALYSIS, ROUTINE W REFLEX MICROSCOPIC
Bilirubin Urine: NEGATIVE
Glucose, UA: NEGATIVE mg/dL
Hgb urine dipstick: NEGATIVE
Ketones, ur: NEGATIVE mg/dL
Nitrite: POSITIVE — AB
Specific Gravity, Urine: 1.022 (ref 1.005–1.030)
pH: 5.5 (ref 5.0–8.0)

## 2023-07-08 LAB — COMPREHENSIVE METABOLIC PANEL
ALT: 18 U/L (ref 0–44)
AST: 22 U/L (ref 15–41)
Albumin: 4.2 g/dL (ref 3.5–5.0)
Alkaline Phosphatase: 44 U/L (ref 38–126)
Anion gap: 13 (ref 5–15)
BUN: 19 mg/dL (ref 8–23)
CO2: 20 mmol/L — ABNORMAL LOW (ref 22–32)
Calcium: 8.7 mg/dL — ABNORMAL LOW (ref 8.9–10.3)
Chloride: 103 mmol/L (ref 98–111)
Creatinine, Ser: 0.96 mg/dL (ref 0.61–1.24)
GFR, Estimated: >60 mL/min (ref >60)
Glucose, Bld: 140 mg/dL — ABNORMAL HIGH (ref 70–99)
Potassium: 4.2 mmol/L (ref 3.5–5.1)
Sodium: 136 mmol/L (ref 135–145)
Total Bilirubin: 0.7 mg/dL (ref 0.3–1.2)
Total Protein: 6.7 g/dL (ref 6.5–8.1)

## 2023-07-08 LAB — LIPASE, BLOOD: Lipase: 23 U/L (ref 11–51)

## 2023-07-08 LAB — RESP PANEL BY RT-PCR (RSV, FLU A&B, COVID)  RVPGX2
Influenza A by PCR: NEGATIVE
Influenza B by PCR: NEGATIVE
Resp Syncytial Virus by PCR: NEGATIVE
SARS Coronavirus 2 by RT PCR: POSITIVE — AB

## 2023-07-08 LAB — IBC + FERRITIN
Ferritin: 10 ng/mL — ABNORMAL LOW (ref 22.0–322.0)
Iron: 16 ug/dL — ABNORMAL LOW (ref 42–165)
Saturation Ratios: 4 % — ABNORMAL LOW (ref 20.0–50.0)
TIBC: 396.2 ug/dL (ref 250.0–450.0)
Transferrin: 283 mg/dL (ref 212.0–360.0)

## 2023-07-08 MED ORDER — ONDANSETRON HCL 4 MG/2ML IJ SOLN
4.0000 mg | Freq: Once | INTRAMUSCULAR | Status: AC | PRN
  Administered 2023-07-08: 4 mg via INTRAVENOUS
  Filled 2023-07-08: qty 2

## 2023-07-08 MED ORDER — CEPHALEXIN 500 MG PO CAPS: 500.0000 mg | ORAL_CAPSULE | Freq: Three times a day (TID) | ORAL | 0 refills | Status: AC

## 2023-07-08 MED ORDER — CEPHALEXIN 250 MG PO CAPS
500.0000 mg | ORAL_CAPSULE | Freq: Once | ORAL | Status: AC
  Administered 2023-07-08: 500 mg via ORAL
  Filled 2023-07-08: qty 2

## 2023-07-08 MED ORDER — ONDANSETRON 4 MG PO TBDP: 4.0000 mg | ORAL_TABLET | Freq: Three times a day (TID) | ORAL | 0 refills | Status: DC | PRN

## 2023-07-08 NOTE — ED Triage Notes (Signed)
Pt presents with fatigue, ShOB, and nausea that started today. Denies CP. Pt thinks he is having problems with his a-fib. Pt's wife with COVID last week.

## 2023-07-08 NOTE — ED Provider Notes (Signed)
[place] EMERGENCY DEPARTMENT AT [facility] Provider Note   CSN: [id number] Arrival date & time: 07/08/23  1624     History {Add pertinent medical, surgical, social history, OB history to HPI:1} Chief Complaint  Patient presents with   Shortness of Breath         Brent Taylor is a 82 y.o. male.  HPI      82 year old male with a history coronary disease, hyperlipidemia, OSA, diabetes, atrial fibrillation on Eliquis, who presents to concern for fatigue, shortness of breath and nausea.   Started to feel sick today, shortness of breath, more severe fatigue, near-syncope Hgb low last few weeks and has been having fatigue Taking medication as presribed No black or bloody stools No chest pain  No fever No vomiting, diarrhea Did have some nausea No abdominal pain, leg pain or swelling Congestion no sore throat Cough Wife has COVID   Past Medical History:  Diagnosis Date   Atrial fibrillation (HCC)    Cancer (HCC)    hx - melanoma   Chicken pox    Colon polyps    Coronary artery disease    Depression    No need for therapy.  Improved   Diabetes (HCC)    Diverticula, colon    Duodenal nodule    Dyspnea    with exertion   GERD (gastroesophageal reflux disease)    Hemorrhoids    Hyperlipidemia    Hypertension    Sepsis (HCC)    Sepsis (HCC)    Sleep apnea    Tinnitus    UTI (lower urinary tract infection)      Home Medications Prior to Admission medications   Medication Sig Start Date End Date Taking? Authorizing Provider  acetaminophen (TYLENOL) 650 MG CR tablet Take 650-1,300 mg by mouth daily as needed for pain.    [provider]  Ascorbic Acid (VITAMIN C PO) Take by mouth.    [provider]  Blood Glucose Monitoring Suppl (ONE TOUCH ULTRA 2) w/Device KIT Use daily to check blood sugar. 10/13/20   [name], MD  Cyanocobalamin (B-12 PO) Take 1 tablet by mouth daily.    [provider]  ELIQUIS 5 MG TABS  tablet TAKE 1 TABLET BY MOUTH TWICE A DAY 05/09/23   [name], [name], MD  ferrous sulfate 325 (65 FE) MG EC tablet Take 325 mg by mouth every other day.    [provider]  FLUoxetine (PROZAC) 40 MG capsule TAKE 1 CAPSULE BY MOUTH DAILY 07/06/23   [name], MD  fluticasone [facility]) 50 MCG/ACT nasal spray Place 1 spray into both nostrils daily as needed for allergies or rhinitis.    [provider]  furosemide (LASIX) 20 MG tablet Take 1 tablet (20 mg total) by mouth daily for 5 days, THEN 1 tablet (20 mg total) daily as needed for edema. Patient taking differently: Take 1 tablet (20 mg total) daily in the morning. 05/26/23 05/15/24  [name], MD  Lancets (ONETOUCH ULTRASOFT) lancets USE TO TEST BLOOD SUGAR DAILY 02/01/23   [name], MD  losartan (COZAAR) 50 MG tablet TAKE 1 TABLET BY MOUTH DAILY 07/06/23   [name], MD  MAGNESIUM PO Take 1 tablet by mouth daily.    [provider]  metoprolol tartrate (LOPRESSOR) 25 MG tablet TAKE 1/2 TABLET BY MOUTH TWICE A DAY 10/05/22   [name], [name], MD  NON FORMULARY Take 1 Dose by mouth in the morning and at  bedtime. Neuropathy Blend Nerve Formula Liquid    [provider]  Omega-3 Fatty Acids (FISH OIL PO) Take 1 capsule by mouth daily.    [provider]  Koren Bound TEST test strip USE TO TEST BLOOD SUGAR AS DIRECTED 04/26/23   [name], MD  pantoprazole (PROTONIX) 40 MG tablet Take 1 tablet (40 mg total) by mouth daily. 05/18/23   Hilarie Fredrickson, MD  pravastatin (PRAVACHOL) 80 MG tablet Take 1 tablet (80 mg total) by mouth every evening. 12/15/22   [name], Rondel Jumbo, MD  traMADol (ULTRAM) 50 MG tablet Take 50 mg by mouth daily as needed for moderate pain or severe pain. 11/18/22   [provider]      Allergies    Gabapentin and Lipitor [atorvastatin calcium]    Review of Systems   Review of Systems  Physical Exam Updated Vital Signs BP  115/73   Pulse 95   Temp 99 F (37.2 C) (Temporal)   Resp 19   Ht 6' (1.829 m)   Wt 108 kg   SpO2 94%   BMI 32.28 kg/m  Physical Exam  ED Results / Procedures / Treatments   Labs (all labs ordered are listed, but only abnormal results are displayed) Labs Reviewed  RESP PANEL BY RT-PCR (RSV, FLU A&B, COVID)  RVPGX2 - Abnormal; Notable for the following components:      Result Value   SARS Coronavirus 2 by RT PCR POSITIVE (*)    All other components within normal limits  COMPREHENSIVE METABOLIC PANEL - Abnormal; Notable for the following components:   CO2 20 (*)    Glucose, Bld 140 (*)    Calcium 8.7 (*)    All other components within normal limits  CBC - Abnormal; Notable for the following components:   WBC 12.7 (*)    Hemoglobin 10.0 (*)    HCT 36.4 (*)    MCV 75.8 (*)    MCH 20.8 (*)    MCHC 27.5 (*)    RDW 19.9 (*)    All other components within normal limits  URINALYSIS, ROUTINE W REFLEX MICROSCOPIC - Abnormal; Notable for the following components:   Protein, ur TRACE (*)    Nitrite POSITIVE (*)    Leukocytes,Ua SMALL (*)    Bacteria, UA RARE (*)    All other components within normal limits  LIPASE, BLOOD    EKG None  Radiology No results found.  Procedures Procedures  {Document cardiac monitor, telemetry assessment procedure when appropriate:1}  Medications Ordered in ED Medications  ondansetron (ZOFRAN) injection 4 mg (4 mg Intravenous Given 07/08/23 1712)    ED Course/ Medical Decision Making/ A&P   {   Click here for ABCD2, HEART and other calculatorsREFRESH Note before signing :1}                              Medical Decision Making Amount and/or Complexity of Data Reviewed Labs: ordered.  Risk Prescription drug management.    82 year old male with a history coronary disease, hyperlipidemia, OSA, diabetes, atrial fibrillation on Eliquis, who presents to concern for fatigue, shortness of breath and nausea.    Labs completed and  personally about interpreted by me are significant for a positive COVID test.  Labs do not show signs of pancreatitis, hepatitis, or other clinically significant electrolyte abnormalities.  His hemoglobin is 10 today, stable from recent prior.  [place] blood cells are elevated at 12.7.  Urinalysis shows 11-20 Vavra blood cells, concern for possible urinary tract infection with positive nitrite.   EKG was completed and evaluated by me and showed atrial fibrillation unchanged from prior  Chest x-ray completed and personally abided by me showing {Document critical care time when appropriate:1} {Document review of labs and clinical decision tools ie heart score, Chads2Vasc2 etc:1}  {Document your independent review of radiology images, and any outside records:1} {Document your discussion with family members, caretakers, and with consultants:1} {Document social determinants of health affecting pt's care:1} {Document your decision making why or why not admission, treatments were needed:1} Final Clinical Impression(s) / ED Diagnoses Final diagnoses:  None    Rx / DC Orders ED Discharge Orders     None

## 2023-07-12 DIAGNOSIS — Z85828 Personal history of other malignant neoplasm of skin: Secondary | ICD-10-CM | POA: Diagnosis not present

## 2023-07-12 DIAGNOSIS — C44329 Squamous cell carcinoma of skin of other parts of face: Secondary | ICD-10-CM | POA: Diagnosis not present

## 2023-07-19 DIAGNOSIS — L570 Actinic keratosis: Secondary | ICD-10-CM | POA: Diagnosis not present

## 2023-07-24 NOTE — Progress Notes (Unsigned)
Cardiology Office Note:  .   Date:  07/25/2023  ID:  Brent Taylor, DOB 05-Aug-1941, MRN [id number] PCP: [name], MD  [facility] Providers Cardiologist:  [name], MD Sleep Medicine:  [name], MD {  History of Present Illness: .   Brent Taylor is a 82 y.o. male with a history of PAF, CAD in 2007 with moderate disease, HLD and incidental mild aortic dilation noted (4 cm) he who presents for cardiology follow-up.  Seen 11/10/2021 after CT aorta showed mild increased diameter (42 mm).  In 2023 his BPs were elevated, in 2024 had surgery and had slightly elevated K.  Improved symptoms.  He was last seen 05/25/2023 and at that time he was doing well.  He cannot tell when he is in atrial fibrillation.  EKG showed AF.  No chest pain/pressure.  No SOB or PND/orthopnea.  No weight gain or leg swelling.  No palpitations or syncope.  He is getting special laser therapy from chiropractor.  He did note more DOE with activity.  Today, he tells me that he had a monitor and it showed 100% A-fib burden.  He has been on metoprolol and Eliquis.  He had a DCCV scheduled but his hemoglobin was low at that time.  Recent hemoglobin of 10.  Scheduled for endoscopy and colonoscopy.  He has been on iron supplements for a few weeks at this point his primary care is planning to recheck.  Seems like his hemoglobin has stabilized for the time being.  He is asymptomatic with his atrial fibrillation  Reports no shortness of breath nor dyspnea on exertion. Reports no chest pain, pressure, or tightness. No edema, orthopnea, PND. Reports no palpitations.    ROS: Pertinent ROS in HPI  Studies Reviewed: .       Cardiac monitor 05/25/23   Predominant underlying rhythm was atrial fibrillation (100% burden).   Several runs of NSVT, longest 11 beats.  May represent aberrant conduction.   Isolated PVCs were occasional (4.9%).   Triggered and diary events associated with atrial fibrillation.    Atrial fibrillation with occasional PVCS Risk Assessment/Calculations:    CHA2DS2-VASc Score = 5   This indicates a 7.2% annual risk of stroke. The patient's score is based upon: CHF History: 0 HTN History: 1 Diabetes History: 1 Stroke History: 0 Vascular Disease History: 1 Age Score: 2 Gender Score: 0        Physical Exam:   VS:  BP 112/68   Pulse 64   Ht 6' (1.829 m)   Wt 235 lb 9.6 oz (106.9 kg)   SpO2 98%   BMI 31.95 kg/m    Wt Readings from Last 3 Encounters:  07/25/23 235 lb 9.6 oz (106.9 kg)  07/08/23 238 lb (108 kg)  07/07/23 238 lb (108 kg)    GEN: Well nourished, well developed in no acute distress NECK: No JVD; No carotid bruits CARDIAC: IRIR, no murmurs, rubs, gallops RESPIRATORY:  Clear to auscultation without rales, wheezing or rhonchi  ABDOMEN: Soft, non-tender, non-distended EXTREMITIES:  No edema; No deformity   ASSESSMENT AND PLAN: .   1.  CAD -no chest pains -Recommend to continue current medication regimen including Eliquis 5 mg twice a day, Lasix 20 mg as needed, Cozaar 50 mg daily, Lopressor 12.5 mg twice a day, pravastatin 80 mg every evening  2.  HTN -well controlled today -112/68 -continue current medication regimen and monitoring BP at home  3.  PAF/PVC -monitor reviewed with the patient -  he was scheduled for DCCV but it was canceled due to his hemoglobin. -current w/o involving endoscopy and colonoscopy to determine the source of bleeding. Recent hemoglobin stable and heis on iron supplements.  4.  Aortic dilatation -4.2 stable aneurysm  -continue annual surveillance  5. OSA on CPAP -continue      Dispo: He can follow-up with A-fib clinic in a month and Dr. [name] in 6 months  Signed, Sharlene Dory, PA-C

## 2023-07-25 ENCOUNTER — Encounter: Payer: Self-pay | Admitting: Physician Assistant

## 2023-07-25 ENCOUNTER — Ambulatory Visit: Payer: Medicare HMO | Attending: Physician Assistant | Admitting: Physician Assistant

## 2023-07-25 ENCOUNTER — Encounter: Payer: Self-pay | Admitting: Internal Medicine

## 2023-07-25 VITALS — BP 112/68 | HR 64 | Ht 72.0 in | Wt 235.6 lb

## 2023-07-25 DIAGNOSIS — I10 Essential (primary) hypertension: Secondary | ICD-10-CM

## 2023-07-25 DIAGNOSIS — I2510 Atherosclerotic heart disease of native coronary artery without angina pectoris: Secondary | ICD-10-CM

## 2023-07-25 DIAGNOSIS — G4733 Obstructive sleep apnea (adult) (pediatric): Secondary | ICD-10-CM | POA: Diagnosis not present

## 2023-07-25 DIAGNOSIS — I77819 Aortic ectasia, unspecified site: Secondary | ICD-10-CM | POA: Diagnosis not present

## 2023-07-25 DIAGNOSIS — I4819 Other persistent atrial fibrillation: Secondary | ICD-10-CM

## 2023-07-25 NOTE — Patient Instructions (Addendum)
Medication Instructions:  Your physician recommends that you continue on your current medications as directed. Please refer to the Current Medication list given to you today.   *If you need a refill on your cardiac medications before your next appointment, please call your pharmacy*   Lab Work: None ordered  If you have labs (blood work) drawn today and your tests are completely normal, you will receive your results only by: MyChart Message (if you have MyChart) OR A paper copy in the mail If you have any lab test that is abnormal or we need to change your treatment, we will call you to review the results.   Testing/Procedures: None ordered   Follow-Up: At [facility], you and your health needs are our priority.  As part of our continuing mission to provide you with exceptional heart care, we have created designated Provider Care Teams.  These Care Teams include your primary Cardiologist (physician) and Advanced Practice Providers (APPs -  Physician Assistants and Nurse Practitioners) who all work together to provide you with the care you need, when you need it.  We recommend signing up for the patient portal called "MyChart".  Sign up information is provided on this After Visit Summary.  MyChart is used to connect with patients for Virtual Visits (Telemedicine).  Patients are able to view lab/test results, encounter notes, upcoming appointments, etc.  Non-urgent messages can be sent to your provider as well.   To learn more about what you can do with MyChart, go to [email or web address].    Your next appointment:   6 month(s)  Provider:   [name], MD      1 MONTH IN THE AFIB CLINIC Other Instructions  Low-Sodium Eating Plan Salt (sodium) helps you keep a healthy balance of fluids in your body. Too much sodium can raise your blood pressure. It can also cause fluid and waste to be held in your body. Your health care provider or dietitian may recommend a  low-sodium eating plan if you have high blood pressure (hypertension), kidney disease, liver disease, or heart failure. Eating less sodium can help lower your blood pressure and reduce swelling. It can also protect your heart, liver, and kidneys. What are tips for following this plan? Reading food labels  Check food labels for the amount of sodium per serving. If you eat more than one serving, you must multiply the listed amount by the number of servings. Choose foods with less than 140 milligrams (mg) of sodium per serving. Avoid foods with 300 mg of sodium or more per serving. Always check how much sodium is in a product, even if the label says "unsalted" or "no salt added." Shopping  Buy products labeled as "low-sodium" or "no salt added." Buy fresh foods. Avoid canned foods and pre-made or frozen meals. Avoid canned, cured, or processed meats. Buy breads that have less than 80 mg of sodium per slice. Cooking  Eat more home-cooked food. Try to eat less restaurant, buffet, and fast food. Try not to add salt when you cook. Use salt-free seasonings or herbs instead of table salt or sea salt. Check with your provider or pharmacist before using salt substitutes. Cook with plant-based oils, such as canola, sunflower, or olive oil. Meal planning When eating at a restaurant, ask if your food can be made with less salt or no salt. Avoid dishes labeled as brined, pickled, cured, or smoked. Avoid dishes made with soy sauce, miso, or teriyaki sauce. Avoid foods that have monosodium glutamate (  MSG) in them. MSG may be added to some restaurant food, sauces, soups, bouillon, and canned foods. Make meals that can be grilled, baked, poached, roasted, or steamed. These are often made with less sodium. General information Try to limit your sodium intake to 1,500-2,300 mg each day, or the amount told by your provider. What foods should I eat? Fruits Fresh, frozen, or canned fruit. Fruit  juice. Vegetables Fresh or frozen vegetables. "No salt added" canned vegetables. "No salt added" tomato sauce and paste. Low-sodium or reduced-sodium tomato and vegetable juice. Grains Low-sodium cereals, such as oats, puffed wheat and rice, and shredded wheat. Low-sodium crackers. Unsalted rice. Unsalted pasta. Low-sodium bread. Whole grain breads and whole grain pasta. Meats and other proteins Fresh or frozen meat, poultry, seafood, and fish. These should have no added salt. Low-sodium canned tuna and salmon. Unsalted nuts. Dried peas, beans, and lentils without added salt. Unsalted canned beans. Eggs. Unsalted nut butters. Dairy Milk. Soy milk. Cheese that is naturally low in sodium, such as ricotta cheese, fresh mozzarella, or Swiss cheese. Low-sodium or reduced-sodium cheese. Cream cheese. Yogurt. Seasonings and condiments Fresh and dried herbs and spices. Salt-free seasonings. Low-sodium mustard and ketchup. Sodium-free salad dressing. Sodium-free light mayonnaise. Fresh or refrigerated horseradish. Lemon juice. Vinegar. Other foods Homemade, reduced-sodium, or low-sodium soups. Unsalted popcorn and pretzels. Low-salt or salt-free chips. The items listed above may not be all the foods and drinks you can have. Talk to a dietitian to learn more. What foods should I avoid? Vegetables Sauerkraut, pickled vegetables, and relishes. Olives. [place] fries. Onion rings. Regular canned vegetables, except low-sodium or reduced-sodium items. Regular canned tomato sauce and paste. Regular tomato and vegetable juice. Frozen vegetables in sauces. Grains Instant hot cereals. Bread stuffing, pancake, and biscuit mixes. Croutons. Seasoned rice or pasta mixes. Noodle soup cups. Boxed or frozen macaroni and cheese. Regular salted crackers. Self-rising flour. Meats and other proteins Meat or fish that is salted, canned, smoked, spiced, or pickled. Precooked or cured meat, such as sausages or meat loaves. Tomasa Blase.  Ham. Pepperoni. Hot dogs. Corned beef. Chipped beef. Salt pork. Jerky. Pickled herring, anchovies, and sardines. Regular canned tuna. Salted nuts. Dairy Processed cheese and cheese spreads. Hard cheeses. Cheese curds. Blue cheese. Feta cheese. String cheese. Regular cottage cheese. Buttermilk. Canned milk. Fats and oils Salted butter. Regular margarine. Ghee. Bacon fat. Seasonings and condiments Onion salt, garlic salt, seasoned salt, table salt, and sea salt. Canned and packaged gravies. Worcestershire sauce. Tartar sauce. Barbecue sauce. Teriyaki sauce. Soy sauce, including reduced-sodium soy sauce. Steak sauce. Fish sauce. Oyster sauce. Cocktail sauce. Horseradish that you find on the shelf. Regular ketchup and mustard. Meat flavorings and tenderizers. Bouillon cubes. Hot sauce. Pre-made or packaged marinades. Pre-made or packaged taco seasonings. Relishes. Regular salad dressings. Salsa. Other foods Salted popcorn and pretzels. Corn chips and puffs. Potato and tortilla chips. Canned or dried soups. Pizza. Frozen entrees and pot pies. The items listed above may not be all the foods and drinks you should avoid. Talk to a dietitian to learn more. This information is not intended to replace advice given to you by your health care provider. Make sure you discuss any questions you have with your health care provider. Document Revised: 10/14/2022 Document Reviewed: 10/14/2022 Elsevier Patient Education  2024 [employer]   Heart-Healthy Eating Plan Eating a healthy diet is important for the health of your heart. A heart-healthy eating plan includes: Eating less unhealthy fats. Eating more healthy fats. Eating less salt in your food. Salt  is also called sodium. Making other changes in your diet. Talk with your doctor or a diet specialist (dietitian) to create an eating plan that is right for you. What is my plan? Your doctor may recommend an eating plan that includes: Total fat: ______% or less  of total calories a day. Saturated fat: ______% or less of total calories a day. Cholesterol: less than _________mg a day. Sodium: less than _________mg a day. What are tips for following this plan? Cooking Avoid frying your food. Try to bake, boil, grill, or broil it instead. You can also reduce fat by: Removing the skin from poultry. Removing all visible fats from meats. Steaming vegetables in water or broth. Meal planning  At meals, divide your plate into four equal parts: Fill one-half of your plate with vegetables and green salads. Fill one-fourth of your plate with whole grains. Fill one-fourth of your plate with lean protein foods. Eat 2-4 cups of vegetables per day. One cup of vegetables is: 1 cup (91 g) broccoli or cauliflower florets. 2 medium carrots. 1 large bell pepper. 1 large sweet potato. 1 large tomato. 1 medium [place] potato. 2 cups (150 g) raw leafy greens. Eat 1-2 cups of fruit per day. One cup of fruit is: 1 small apple 1 large banana 1 cup (237 g) mixed fruit, 1 large orange,  cup (82 g) dried fruit, 1 cup (240 mL) 100% fruit juice. Eat more foods that have soluble fiber. These are apples, broccoli, carrots, beans, peas, and barley. Try to get 20-30 g of fiber per day. Eat 4-5 servings of nuts, legumes, and seeds per week: 1 serving of dried beans or legumes equals  cup (90 g) cooked. 1 serving of nuts is  oz (12 almonds, 24 pistachios, or 7 walnut halves). 1 serving of seeds equals  oz (8 g). General information Eat more home-cooked food. Eat less restaurant, buffet, and fast food. Limit or avoid alcohol. Limit foods that are high in starch and sugar. Avoid fried foods. Lose weight if you are overweight. Keep track of how much salt (sodium) you eat. This is important if you have high blood pressure. Ask your doctor to tell you more about this. Try to add vegetarian meals each week. Fats Choose healthy fats. These include olive oil and canola  oil, flaxseeds, walnuts, almonds, and seeds. Eat more omega-3 fats. These include salmon, mackerel, sardines, tuna, flaxseed oil, and ground flaxseeds. Try to eat fish at least 2 times each week. Check food labels. Avoid foods with trans fats or high amounts of saturated fat. Limit saturated fats. These are often found in animal products, such as meats, butter, and cream. These are also found in plant foods, such as palm oil, palm kernel oil, and coconut oil. Avoid foods with partially hydrogenated oils in them. These have trans fats. Examples are stick margarine, some tub margarines, cookies, crackers, and other baked goods. What foods should I eat? Fruits All fresh, canned (in natural juice), or frozen fruits. Vegetables Fresh or frozen vegetables (raw, steamed, roasted, or grilled). Green salads. Grains Most grains. Choose whole wheat and whole grains most of the time. Rice and pasta, including brown rice and pastas made with whole wheat. Meats and other proteins Lean, well-trimmed beef, veal, pork, and lamb. Chicken and [place] without skin. All fish and shellfish. Wild duck, rabbit, pheasant, and venison. Egg whites or low-cholesterol egg substitutes. Dried beans, peas, lentils, and tofu. Seeds and most nuts. Dairy Low-fat or nonfat cheeses, including ricotta and  mozzarella. Skim or 1% milk that is liquid, powdered, or evaporated. Buttermilk that is made with low-fat milk. Nonfat or low-fat yogurt. Fats and oils Non-hydrogenated (trans-free) margarines. Vegetable oils, including soybean, sesame, sunflower, olive, peanut, safflower, corn, canola, and cottonseed. Salad dressings or mayonnaise made with a vegetable oil. Beverages Mineral water. Coffee and tea. Diet carbonated beverages. Sweets and desserts Sherbet, gelatin, and fruit ice. Small amounts of dark chocolate. Limit all sweets and desserts. Seasonings and condiments All seasonings and condiments. The items listed above may not  be a complete list of foods and drinks you can eat. Contact a dietitian for more options. What foods should I avoid? Fruits Canned fruit in heavy syrup. Fruit in cream or butter sauce. Fried fruit. Limit coconut. Vegetables Vegetables cooked in cheese, cream, or butter sauce. Fried vegetables. Grains Breads that are made with saturated or trans fats, oils, or whole milk. Croissants. Sweet rolls. Donuts. High-fat crackers, such as cheese crackers. Meats and other proteins Fatty meats, such as hot dogs, ribs, sausage, bacon, rib-eye roast or steak. High-fat deli meats, such as salami and bologna. Caviar. Domestic duck and goose. Organ meats, such as liver. Dairy Cream, sour cream, cream cheese, and creamed cottage cheese. Whole-milk cheeses. Whole or 2% milk that is liquid, evaporated, or condensed. Whole buttermilk. Cream sauce or high-fat cheese sauce. Yogurt that is made from whole milk. Fats and oils Meat fat, or shortening. Cocoa butter, hydrogenated oils, palm oil, coconut oil, palm kernel oil. Solid fats and shortenings, including bacon fat, salt pork, lard, and butter. Nondairy cream substitutes. Salad dressings with cheese or sour cream. Beverages Regular sodas and juice drinks with added sugar. Sweets and desserts Frosting. Pudding. Cookies. Cakes. Pies. Milk chocolate or [place] chocolate. Buttered syrups. Full-fat ice cream or ice cream drinks. The items listed above may not be a complete list of foods and drinks to avoid. Contact a dietitian for more information. Summary Heart-healthy meal planning includes eating less unhealthy fats, eating more healthy fats, and making other changes in your diet. Eat a balanced diet. This includes fruits and vegetables, low-fat or nonfat dairy, lean protein, nuts and legumes, whole grains, and heart-healthy oils and fats. This information is not intended to replace advice given to you by your health care provider. Make sure you discuss any questions  you have with your health care provider. Document Revised: 11/02/2021 Document Reviewed: 11/02/2021 Elsevier Patient Education  2024 [employer].

## 2023-07-31 ENCOUNTER — Encounter: Payer: Self-pay | Admitting: Hematology and Oncology

## 2023-08-01 ENCOUNTER — Inpatient Hospital Stay: Payer: Medicare HMO | Attending: Hematology and Oncology

## 2023-08-01 ENCOUNTER — Other Ambulatory Visit: Payer: Self-pay

## 2023-08-01 ENCOUNTER — Encounter: Payer: Self-pay | Admitting: Hematology and Oncology

## 2023-08-01 ENCOUNTER — Inpatient Hospital Stay: Payer: Medicare HMO | Admitting: Hematology and Oncology

## 2023-08-01 VITALS — BP 130/80 | HR 85 | Temp 98.8°F | Resp 18 | Ht 72.0 in | Wt 236.8 lb

## 2023-08-01 DIAGNOSIS — F32A Depression, unspecified: Secondary | ICD-10-CM | POA: Diagnosis not present

## 2023-08-01 DIAGNOSIS — Z888 Allergy status to other drugs, medicaments and biological substances status: Secondary | ICD-10-CM | POA: Diagnosis not present

## 2023-08-01 DIAGNOSIS — Z85828 Personal history of other malignant neoplasm of skin: Secondary | ICD-10-CM | POA: Diagnosis not present

## 2023-08-01 DIAGNOSIS — K219 Gastro-esophageal reflux disease without esophagitis: Secondary | ICD-10-CM | POA: Diagnosis not present

## 2023-08-01 DIAGNOSIS — Z87440 Personal history of urinary (tract) infections: Secondary | ICD-10-CM | POA: Insufficient documentation

## 2023-08-01 DIAGNOSIS — Z860100 Personal history of colon polyps, unspecified: Secondary | ICD-10-CM

## 2023-08-01 DIAGNOSIS — G4730 Sleep apnea, unspecified: Secondary | ICD-10-CM | POA: Diagnosis not present

## 2023-08-01 DIAGNOSIS — K449 Diaphragmatic hernia without obstruction or gangrene: Secondary | ICD-10-CM | POA: Insufficient documentation

## 2023-08-01 DIAGNOSIS — E538 Deficiency of other specified B group vitamins: Secondary | ICD-10-CM | POA: Diagnosis not present

## 2023-08-01 DIAGNOSIS — R0989 Other specified symptoms and signs involving the circulatory and respiratory systems: Secondary | ICD-10-CM | POA: Diagnosis not present

## 2023-08-01 DIAGNOSIS — I4891 Unspecified atrial fibrillation: Secondary | ICD-10-CM | POA: Insufficient documentation

## 2023-08-01 DIAGNOSIS — E785 Hyperlipidemia, unspecified: Secondary | ICD-10-CM | POA: Diagnosis not present

## 2023-08-01 DIAGNOSIS — I10 Essential (primary) hypertension: Secondary | ICD-10-CM | POA: Insufficient documentation

## 2023-08-01 DIAGNOSIS — E119 Type 2 diabetes mellitus without complications: Secondary | ICD-10-CM

## 2023-08-01 DIAGNOSIS — Z7901 Long term (current) use of anticoagulants: Secondary | ICD-10-CM | POA: Diagnosis not present

## 2023-08-01 DIAGNOSIS — I2510 Atherosclerotic heart disease of native coronary artery without angina pectoris: Secondary | ICD-10-CM | POA: Insufficient documentation

## 2023-08-01 DIAGNOSIS — Z85820 Personal history of malignant melanoma of skin: Secondary | ICD-10-CM | POA: Diagnosis not present

## 2023-08-01 DIAGNOSIS — Z79899 Other long term (current) drug therapy: Secondary | ICD-10-CM | POA: Insufficient documentation

## 2023-08-01 DIAGNOSIS — R0602 Shortness of breath: Secondary | ICD-10-CM | POA: Insufficient documentation

## 2023-08-01 DIAGNOSIS — D509 Iron deficiency anemia, unspecified: Secondary | ICD-10-CM | POA: Diagnosis not present

## 2023-08-01 DIAGNOSIS — R1310 Dysphagia, unspecified: Secondary | ICD-10-CM | POA: Insufficient documentation

## 2023-08-01 DIAGNOSIS — Z8719 Personal history of other diseases of the digestive system: Secondary | ICD-10-CM | POA: Insufficient documentation

## 2023-08-01 DIAGNOSIS — Z87891 Personal history of nicotine dependence: Secondary | ICD-10-CM | POA: Insufficient documentation

## 2023-08-01 DIAGNOSIS — Z8249 Family history of ischemic heart disease and other diseases of the circulatory system: Secondary | ICD-10-CM

## 2023-08-01 LAB — CBC WITH DIFFERENTIAL (CANCER CENTER ONLY)
Abs Immature Granulocytes: 0.02 10*3/uL (ref 0.00–0.07)
Basophils Absolute: 0 10*3/uL (ref 0.0–0.1)
Basophils Relative: 1 %
Eosinophils Absolute: 0.1 10*3/uL (ref 0.0–0.5)
Eosinophils Relative: 1 %
HCT: 36.3 % — ABNORMAL LOW (ref 39.0–52.0)
Hemoglobin: 10.2 g/dL — ABNORMAL LOW (ref 13.0–17.0)
Immature Granulocytes: 0 %
Lymphocytes Relative: 19 %
Lymphs Abs: 1.1 10*3/uL (ref 0.7–4.0)
MCH: 22 pg — ABNORMAL LOW (ref 26.0–34.0)
MCHC: 28.1 g/dL — ABNORMAL LOW (ref 30.0–36.0)
MCV: 78.2 fL — ABNORMAL LOW (ref 80.0–100.0)
Monocytes Absolute: 0.7 10*3/uL (ref 0.1–1.0)
Monocytes Relative: 12 %
Neutro Abs: 3.9 10*3/uL (ref 1.7–7.7)
Neutrophils Relative %: 67 %
Platelet Count: 209 10*3/uL (ref 150–400)
RBC: 4.64 MIL/uL (ref 4.22–5.81)
RDW: 19.6 % — ABNORMAL HIGH (ref 11.5–15.5)
WBC Count: 5.9 10*3/uL (ref 4.0–10.5)
nRBC: 0 % (ref 0.0–0.2)

## 2023-08-01 NOTE — Assessment & Plan Note (Signed)
He has history of vitamin B12 deficiency but much improved since he started taking oral iron supplement He will continue the same

## 2023-08-01 NOTE — Progress Notes (Signed)
[facility] CONSULT NOTE  Patient Care Team: [name], MD as PCP - General (Family Medicine) [name], MD as PCP - Cardiology (Cardiology) Quintella Reichert, MD as PCP - Sleep Medicine (Cardiology) Dahlia Byes, [facility] (Inactive) (Pharmacist) Dahlia Byes, [facility] (Inactive) as Pharmacist (Pharmacist)  ASSESSMENT & PLAN:  Iron deficiency anemia Repeat blood count today shows stability The patient is not symptomatic from anemia We discussed risk and benefits of intravenous iron versus oral iron supplement and ultimately, the patient elected to continue oral iron supplement I gave him specific instruction to take his iron supplement 30 minutes before dinnertime daily instead of every other day He has annual physical exam with his primary care doctor in January and I will reach out to his primary care doctor to have his iron studies rechecked in January If he has no improvement with oral iron supplement, I will bring him in for intravenous iron infusion He agreed with the plan of care  B12 deficiency He has history of vitamin B12 deficiency but much improved since he started taking oral iron supplement He will continue the same  History of melanoma He has history of melanoma status post resection and recent diagnosis of basal cell carcinoma status post resection We discussed importance of use of sunscreen and avoidance of excessive sun exposure He will continue close monitoring and follow-up with his dermatologist  Orders Placed This Encounter  Procedures   CBC with Differential (Cancer Center Only)    Standing Status:   Future    Number of Occurrences:   1    Standing Expiration Date:   07/31/2024   Ferritin    Standing Status:   Future    Number of Occurrences:   1    Standing Expiration Date:   07/31/2024   Sample to Blood Bank    All questions were answered. The patient knows to call the clinic with any problems, questions or concerns.  The  total time spent in the appointment was 55 minutes encounter with patients including review of chart and various tests results, discussions about plan of care and coordination of care plan  Artis Delay, MD 10/21/20243:30 PM   CHIEF COMPLAINTS/PURPOSE OF CONSULTATION:  Anemia  HISTORY OF PRESENTING ILLNESS:  Brent Taylor 82 y.o. male is here because of anemia  He was found to have abnormal CBC from recent blood work I have the opportunity to review his CBC within the electronic record system dated back to 2013 On December 10, 2011, his hemoglobin was 15.3 On October 26, 2022, his hemoglobin was 14.1 but noted decline in MCV On February 04, 2023, hemoglobin is 12.7 On June 20, 2023, hemoglobin is 10.6 On June 27, 2023, B12 level was 548 On July 08, 2023, hemoglobin was 10 with a ferritin of 10 The patient used to be placed on B12 injection in 2023 but currently taking oral B12 supplement  He denies recent chest pain on exertion, pre-syncopal episodes, or palpitations.  He has shortness of breath on moderate exertion He had not noticed any recent bleeding such as epistaxis, hematuria or hematochezia The patient denies over the counter NSAID ingestion. He is on Eliquis for atrial fibrillation His last colonoscopy was more than 10 years ago and he is scheduled for repeat colonoscopy this week On 02/05/2023, he underwent EGD due to sensation of dysphagia On 03/17/2023, he was found to have a stricture in his esophagus and it was dilated subsequently.  He was also found to have  hiatal hernia and benign lesion in the mucosa. His dysphagia went away He had prior history melanoma.  He follows closely with dermatologist.  He had multiple skin lesions removed, typically noninvasive.  Most recently he had a lesion removed from the right cheek that came back basal cell carcinoma. . His age appropriate screening programs are up-to-date. He denies any pica and eats a variety of diet. He never  donated blood or received blood transfusion The patient was prescribed oral iron supplements and he takes every other day in the morning same time that he take his breakfast and pantoprazole  MEDICAL HISTORY:  Past Medical History:  Diagnosis Date   Atrial fibrillation (HCC)    Cancer (HCC)    hx - melanoma   Chicken pox    Colon polyps    Coronary artery disease    Depression    No need for therapy.  Improved   Diabetes (HCC)    Diverticula, colon    Duodenal nodule    Dyspnea    with exertion   GERD (gastroesophageal reflux disease)    Hemorrhoids    Hyperlipidemia    Hypertension    Sepsis (HCC)    Sepsis (HCC)    Sleep apnea    Tinnitus    UTI (lower urinary tract infection)     SURGICAL HISTORY: Past Surgical History:  Procedure Laterality Date   CARDIAC CATHETERIZATION     ESOPHAGOGASTRODUODENOSCOPY N/A 02/05/2023   Procedure: ESOPHAGOGASTRODUODENOSCOPY (EGD);  Surgeon: Tressia Danas, MD;  Location: [facility] ENDOSCOPY;  Service: Gastroenterology;  Laterality: N/A;   FOREIGN BODY REMOVAL  02/05/2023   Procedure: FOREIGN BODY REMOVAL;  Surgeon: Tressia Danas, MD;  Location: [facility] ENDOSCOPY;  Service: Gastroenterology;;   LUMBAR LAMINECTOMY/DECOMPRESSION MICRODISCECTOMY N/A 11/08/2022   Procedure: LUMBAR FOUR FIVE DECOMPRESSION AND IN SITU FUSION;  Surgeon: Venita Lick, MD;  Location: [facility] OR;  Service: Orthopedics;  Laterality: N/A;    MELANOMA EXCISION     LEFT CHEST   MELANOMA EXCISION      SOCIAL HISTORY: Social History   Socioeconomic History   Marital status: Married    Spouse name: Not on file   Number of children: 2   Years of education: Not on file   Highest education level: Master's degree (e.g., MA, MS, MEng, MEd, MSW, MBA)  Occupational History   Occupation: Optician, dispensing   Occupation: retired  Tobacco Use   Smoking status: Former    Types: Cigarettes   Smokeless tobacco: Never   Tobacco comments:    Former smoker 06/17/23  Vaping Use    Vaping status: Never Used  Substance and Sexual Activity   Alcohol use: No   Drug use: No   Sexual activity: Not on file  Other Topics Concern   Not on file  Social History Narrative   Lives with wife in [place].   Retired [employer]   Attends [employer]     Social Determinants of Health   Financial Resource Strain: Low Risk  (12/23/2022)   Overall Financial Resource Strain (CARDIA)    Difficulty of Paying Living Expenses: Not hard at all  Food Insecurity: No Food Insecurity (01/14/2023)   Hunger Vital Sign    Worried About Running Out of Food in the Last Year: Never true    Ran Out of Food in the Last Year: Never true  Transportation Needs: No Transportation Needs (01/14/2023)   PRAPARE - Administrator, Civil Service (Medical): No    Lack of Transportation (  Non-Medical): No  Physical Activity: Sufficiently Active (01/14/2023)   Exercise Vital Sign    Days of Exercise per Week: 7 days    Minutes of Exercise per Session: 60 min  Recent Concern: Physical Activity - Insufficiently Active (12/23/2022)   Exercise Vital Sign    Days of Exercise per Week: 5 days    Minutes of Exercise per Session: 20 min  Stress: No Stress Concern Present (01/14/2023)   [employer] of Occupational Health - Occupational Stress Questionnaire    Feeling of Stress : Only a little  Social Connections: Socially Integrated (01/14/2023)   Social Connection and Isolation Panel [NHANES]    Frequency of Communication with Friends and Family: More than three times a week    Frequency of Social Gatherings with Friends and Family: Twice a week    Attends Religious Services: More than 4 times per year    Active Member of [employer] or Organizations: Yes    Attends Engineer, structural: More than 4 times per year    Marital Status: Married  Catering manager Violence: Not At Risk (12/23/2022)   Humiliation, Afraid, Rape, and Kick questionnaire    Fear of Current or Ex-Partner: No     Emotionally Abused: No    Physically Abused: No    Sexually Abused: No    FAMILY HISTORY: Family History  Problem Relation Age of Onset   Heart Problems Mother        Skipping    ALLERGIES:  is allergic to gabapentin and lipitor [atorvastatin calcium].  MEDICATIONS:  Current Outpatient Medications  Medication Sig Dispense Refill   acetaminophen (TYLENOL) 650 MG CR tablet Take 650-1,300 mg by mouth daily as needed for pain.     Ascorbic Acid (VITAMIN C PO) Take by mouth.     Blood Glucose Monitoring Suppl (ONE TOUCH ULTRA 2) w/Device KIT Use daily to check blood sugar. 1 kit 0   Cyanocobalamin (B-12 PO) Take 1 tablet by mouth daily.     ELIQUIS 5 MG TABS tablet TAKE 1 TABLET BY MOUTH TWICE A DAY 60 tablet 5   ferrous sulfate 325 (65 FE) MG EC tablet Take 325 mg by mouth every other day.     FLUoxetine (PROZAC) 40 MG capsule TAKE 1 CAPSULE BY MOUTH DAILY 90 capsule 3   fluticasone (FLONASE) 50 MCG/ACT nasal spray Place 1 spray into both nostrils daily as needed for allergies or rhinitis.     furosemide (LASIX) 20 MG tablet Take 20 mg by mouth daily.     Lancets (ONETOUCH ULTRASOFT) lancets USE TO TEST BLOOD SUGAR DAILY 100 each 12   losartan (COZAAR) 50 MG tablet TAKE 1 TABLET BY MOUTH DAILY 90 tablet 0   MAGNESIUM PO Take 1 tablet by mouth daily.     metoprolol tartrate (LOPRESSOR) 25 MG tablet TAKE 1/2 TABLET BY MOUTH TWICE A DAY 90 tablet 3   NON FORMULARY Take 1 Dose by mouth in the morning and at bedtime. Neuropathy Blend Nerve Formula Liquid     ondansetron (ZOFRAN-ODT) 4 MG disintegrating tablet Take 1 tablet (4 mg total) by mouth every 8 (eight) hours as needed for nausea or vomiting. 12 tablet 0   ONETOUCH ULTRA TEST test strip USE TO TEST BLOOD SUGAR AS DIRECTED 100 strip 3   pantoprazole (PROTONIX) 40 MG tablet Take 1 tablet (40 mg total) by mouth daily. 90 tablet 3   pravastatin (PRAVACHOL) 80 MG tablet Take 1 tablet (80 mg total) by mouth every evening. 90  tablet 3    traMADol (ULTRAM) 50 MG tablet Take 50 mg by mouth daily as needed for moderate pain or severe pain.     No current facility-administered medications for this visit.    REVIEW OF SYSTEMS: He has shortness of breath on moderate exertion Constitutional: Denies fevers, chills or abnormal night sweats Eyes: Denies blurriness of vision, double vision or watery eyes Ears, nose, mouth, throat, and face: Denies mucositis or sore throat Cardiovascular: Denies palpitation, chest discomfort or lower extremity swelling Gastrointestinal:  Denies nausea, heartburn or change in bowel habits Skin: Denies abnormal skin rashes Lymphatics: Denies new lymphadenopathy or easy bruising Neurological:Denies numbness, tingling or new weaknesses Behavioral/Psych: Mood is stable, no new changes  All other systems were reviewed with the patient and are negative.  PHYSICAL EXAMINATION: ECOG PERFORMANCE STATUS: 1 - Symptomatic but completely ambulatory  Vitals:   08/01/23 1420  BP: 130/80  Pulse: 85  Resp: 18  Temp: 98.8 F (37.1 C)  SpO2: 98%   Filed Weights   08/01/23 1420  Weight: 236 lb 12.8 oz (107.4 kg)    GENERAL:alert, no distress and comfortable SKIN: skin color, texture, turgor are normal, no rashes or significant lesions EYES: normal, conjunctiva are pink and non-injected, sclera clear OROPHARYNX:no exudate, no erythema and lips, buccal mucosa, and tongue normal  NECK: supple, thyroid normal size, non-tender, without nodularity LYMPH:  no palpable lymphadenopathy in the cervical, axillary or inguinal LUNGS: clear to auscultation and percussion with normal breathing effort HEART: regular rate & rhythm and no murmurs and no lower extremity edema ABDOMEN:abdomen soft, non-tender and normal bowel sounds Musculoskeletal:no cyanosis of digits and no clubbing  PSYCH: alert & oriented x 3 with fluent speech NEURO: no focal motor/sensory deficits  RADIOGRAPHIC STUDIES: I have personally reviewed  the radiological images as listed and agreed with the findings in the report. DG Chest Port 1 View  Result Date: 07/08/2023 CLINICAL DATA:  Shortness of breath. EXAM: PORTABLE CHEST 1 VIEW COMPARISON:  02/04/2023 FINDINGS: Low volume film. The cardio pericardial silhouette is enlarged. There is pulmonary vascular congestion without overt pulmonary edema. Moderate to large hiatal hernia noted. No acute bony abnormality. Telemetry leads overlie the chest. IMPRESSION: 1. Low volume film with pulmonary vascular congestion. 2. Moderate to large hiatal hernia. Electronically Signed   By: Kennith Center M.D.   On: 07/08/2023 18:31

## 2023-08-01 NOTE — Assessment & Plan Note (Signed)
Repeat blood count today shows stability The patient is not symptomatic from anemia We discussed risk and benefits of intravenous iron versus oral iron supplement and ultimately, the patient elected to continue oral iron supplement I gave him specific instruction to take his iron supplement 30 minutes before dinnertime daily instead of every other day He has annual physical exam with his primary care doctor in January and I will reach out to his primary care doctor to have his iron studies rechecked in January If he has no improvement with oral iron supplement, I will bring him in for intravenous iron infusion He agreed with the plan of care

## 2023-08-01 NOTE — Assessment & Plan Note (Signed)
He has history of melanoma status post resection and recent diagnosis of basal cell carcinoma status post resection We discussed importance of use of sunscreen and avoidance of excessive sun exposure He will continue close monitoring and follow-up with his dermatologist

## 2023-08-02 ENCOUNTER — Telehealth: Payer: Self-pay | Admitting: *Deleted

## 2023-08-02 DIAGNOSIS — G4733 Obstructive sleep apnea (adult) (pediatric): Secondary | ICD-10-CM | POA: Diagnosis not present

## 2023-08-02 LAB — FERRITIN: Ferritin: 6 ng/mL — ABNORMAL LOW (ref 24–336)

## 2023-08-02 NOTE — Telephone Encounter (Signed)
Left message to return call to our office at their convenience.   Received request for continue glucose monitor from [facility], I want to make sure you order this before faxing request

## 2023-08-03 ENCOUNTER — Telehealth: Payer: Self-pay

## 2023-08-03 ENCOUNTER — Encounter: Payer: Self-pay | Admitting: Internal Medicine

## 2023-08-03 NOTE — Telephone Encounter (Signed)
[facility] Pre-operative Risk Assessment   URGENT     Request for surgical clearance:     Endoscopy Procedure  What type of surgery is being performed?     colonoscopy  When is this surgery scheduled?     08/05/2023  What type of clearance is required ?   Pharmacy  Are there any medications that need to be held prior to surgery and how long? eliquis  Practice name and name of physician performing surgery?      [facility]  What is your office phone and fax number?      Phone- [phone]  Fax- [phone]  Anesthesia type (None, local, MAC, general) ?       MAC

## 2023-08-03 NOTE — Telephone Encounter (Signed)
     Primary Cardiologist: [name], MD  Chart reviewed as part of pre-operative protocol coverage. Given past medical history and time since last visit, based on ACC/AHA guidelines, Brent Taylor would be at acceptable risk for the planned procedure without further cardiovascular testing.   Patient with diagnosis of afib on Eliquis for anticoagulation.     Procedure: colonoscopy Date of procedure: 08/05/23   CHA2DS2-VASc Score = 5  This indicates a 7.2% annual risk of stroke. The patient's score is based upon: CHF History: 0 HTN History: 1 Diabetes History: 1 Stroke History: 0 Vascular Disease History: 1 Age Score: 2 Gender Score: 0   Recent DCCV canceled due to low hemoglobin. Colonoscopy scheduled to determine source of bleeding.   CrCl 43mL/min using adjusted body weight Platelet count 243K   Per office protocol, patient can hold Eliquis for 1-2 days prior to procedure.   I will route this recommendation to the requesting party via Epic fax function and remove from pre-op pool.  Please call with questions.  Thomasene Ripple. Brailyn Delman NP-C     08/03/2023, 2:49 PM [facility] [address] Office [phone] Fax [phone]

## 2023-08-03 NOTE — Telephone Encounter (Signed)
Patient with diagnosis of afib on Eliquis for anticoagulation.    Procedure: colonoscopy Date of procedure: 08/05/23  CHA2DS2-VASc Score = 5  This indicates a 7.2% annual risk of stroke. The patient's score is based upon: CHF History: 0 HTN History: 1 Diabetes History: 1 Stroke History: 0 Vascular Disease History: 1 Age Score: 2 Gender Score: 0   Recent DCCV canceled due to low hemoglobin. Colonoscopy scheduled to determine source of bleeding.  CrCl 66mL/min using adjusted body weight Platelet count 243K  Per office protocol, patient can hold Eliquis for 1-2 days prior to procedure.    **This guidance is not considered finalized until pre-operative APP has relayed final recommendations.**

## 2023-08-03 NOTE — Telephone Encounter (Signed)
Called patient and discuss eliquis hold, patient verbalized understanding and discussed any other questions he had.

## 2023-08-05 ENCOUNTER — Ambulatory Visit (AMBULATORY_SURGERY_CENTER): Payer: Medicare HMO | Admitting: Internal Medicine

## 2023-08-05 ENCOUNTER — Encounter: Payer: Self-pay | Admitting: Internal Medicine

## 2023-08-05 VITALS — BP 122/57 | HR 82 | Temp 97.8°F | Resp 14 | Ht 72.0 in | Wt 236.0 lb

## 2023-08-05 DIAGNOSIS — K219 Gastro-esophageal reflux disease without esophagitis: Secondary | ICD-10-CM

## 2023-08-05 DIAGNOSIS — K222 Esophageal obstruction: Secondary | ICD-10-CM | POA: Diagnosis not present

## 2023-08-05 DIAGNOSIS — D509 Iron deficiency anemia, unspecified: Secondary | ICD-10-CM | POA: Diagnosis not present

## 2023-08-05 DIAGNOSIS — D120 Benign neoplasm of cecum: Secondary | ICD-10-CM | POA: Diagnosis not present

## 2023-08-05 DIAGNOSIS — I2510 Atherosclerotic heart disease of native coronary artery without angina pectoris: Secondary | ICD-10-CM | POA: Diagnosis not present

## 2023-08-05 DIAGNOSIS — D122 Benign neoplasm of ascending colon: Secondary | ICD-10-CM

## 2023-08-05 DIAGNOSIS — G4733 Obstructive sleep apnea (adult) (pediatric): Secondary | ICD-10-CM | POA: Diagnosis not present

## 2023-08-05 DIAGNOSIS — I10 Essential (primary) hypertension: Secondary | ICD-10-CM | POA: Diagnosis not present

## 2023-08-05 MED ORDER — SODIUM CHLORIDE 0.9 % IV SOLN: 500.0000 mL | Freq: Once | INTRAVENOUS | Status: DC

## 2023-08-05 NOTE — Progress Notes (Signed)
Report to PACU, RN, vss, BBS= Clear.  

## 2023-08-05 NOTE — Op Note (Signed)
[name] Patient Name: Brent Taylor Procedure Date: 08/05/2023 1:39 PM MRN: [id number] Endoscopist: Wilhemina Bonito. Marina Goodell , MD, [id number] Age: 82 Referring MD:  Date of Birth: 1941/02/09 Gender: Male Account #: [id number] Procedure:                Upper GI endoscopy Indications:              Iron deficiency anemia. Had upper endoscopy in June                            for dysphagia. Underwent balloon dilation of the                            distal stricture. Currently without dysphagia Medicines:                Monitored Anesthesia Care Procedure:                Pre-Anesthesia Assessment:                           - Prior to the procedure, a History and Physical                            was performed, and patient medications and                            allergies were reviewed. The patient's tolerance of                            previous anesthesia was also reviewed. The risks                            and benefits of the procedure and the sedation                            options and risks were discussed with the patient.                            All questions were answered, and informed consent                            was obtained. Prior Anticoagulants: The patient has                            taken Eliquis (apixaban), last dose was 2 days                            prior to procedure. ASA Grade Assessment: III - A                            patient with severe systemic disease. After                            reviewing the risks and benefits, the patient was  deemed in satisfactory condition to undergo the                            procedure.                           After obtaining informed consent, the endoscope was                            passed under direct vision. Throughout the                            procedure, the patient's blood pressure, pulse, and                            oxygen saturations were monitored  continuously. The                            GIF [id number] #[id number] was introduced through the                            mouth, and advanced to the second part of duodenum.                            The upper GI endoscopy was accomplished without                            difficulty. The patient tolerated the procedure                            well. Scope In: Scope Out: Findings:                 The esophagus was tortuous and revealed a large                            caliber ringlike distal stricture. No inflammation.                            Marked improvement from prior exam.                           The stomach revealed a large hiatal hernia with few                            Cameron erosions. Stomach was otherwise                            unremarkable.                           The examined duodenum was normal.                           The cardia and gastric fundus were normal on  retroflexion. Complications:            No immediate complications. Estimated Blood Loss:     Estimated blood loss: none. Impression:               1. GERD with esophageal stricture                           2. Large hiatal hernia with Sheria Lang erosions. This                            can cause iron deficiency anemia, particular in the                            face of chronic anticoagulation                           3. Otherwise unremarkable EGD                           4. Please see colonoscopy report. Recommendation:           - Patient has a contact number available for                            emergencies. The signs and symptoms of potential                            delayed complications were discussed with the                            patient. Return to normal activities tomorrow.                            Written discharge instructions were provided to the                            patient.                           - Resume previous diet.                            - Continue present medications.                           - Resume Eliquis (apixaban) at prior dose tomorrow. Wilhemina Bonito. Marina Goodell, MD 08/05/2023 2:38:33 PM This report has been signed electronically.

## 2023-08-05 NOTE — Progress Notes (Signed)
Assessment plan reviewed. Suspect iron deficiency anemia is related to Brent Taylor erosions (linear erosions on hernia sac noted on last endoscopy).  However, he has not had a colonoscopy in some time.  Agree with colonoscopy and upper endoscopy as planned.  He is high risk given his age and comorbidities.  Agree with plans to touch base with cardiology regarding anticoagulation. Dr. Marina Goodell      Progress Notes by Doree Albee, PA-C at 07/07/2023 1:30 PM  Author: Doree Albee, PA-C Author Type: Physician Assistant Certified Filed: 07/07/2023  2:29 PM  Note Status: Signed Cosign: Cosign Not Required Encounter Date: 07/07/2023  Editor: Javier Glazier (Physician Assistant Certified)             Expand All Collapse All        07/07/2023 Brent Taylor [id number] [birth year]   Referring provider: [name], MD Primary GI doctor: Dr. Marina Goodell   ASSESSMENT AND PLAN:       Iron Deficiency Anemia Significant drop in hemoglobin over the past 8-9 months. No overt GI symptoms. On iron supplementation every other day. Fecal occult blood test with faint positive result. -Schedule upper endoscopy and colonoscopy with Dr. Marina Goodell, should be appropriate at [facility] -Obtain permission from cardiologist to hold Eliquis for 2 days prior to procedures. -Repeat CBC, iron, and ferritin in 1-2 weeks to assess response to iron supplementation. -Consider capsule endoscopy if endoscopy and colonoscopy are negative and anemia persists. I recommend upper gastrointestinal and colorectal evaluation with an EGD and colonoscopy.  Risk of bowel prep, conscious sedation, and EGD and colonoscopy were discussed.  Risks include but are not limited to dehydration, pain, bleeding, cardiopulmonary process, bowel perforation, or other possible adverse outcomes..  Treatment plan was discussed with patient, and agreed upon.     Atrial Fibrillation On Eliquis for stroke prevention. Recent increase in AFib burden noted on  heart monitor. -Continue Eliquis as prescribed, holding for 2 days prior to endoscopy and colonoscopy with cardiologist's approval.   Hiatal Hernia Large hiatal hernia noted on previous endoscopy. No current symptoms. -Continue Pantoprazole 40mg  daily. -Reevaluate during upcoming endoscopy to rule out cameron lesions, etc.         Patient Care Team: [name], MD as PCP - General (Family Medicine) [name], MD as PCP - Cardiology (Cardiology) Quintella Reichert, MD as PCP - Sleep Medicine (Cardiology) Dahlia Byes, [facility] (Inactive) (Pharmacist) Dahlia Byes, [facility] (Inactive) as Pharmacist (Pharmacist)   HISTORY OF PRESENT ILLNESS: 82 y.o. male with a past medical history of type 2 diabetes, hypertension, hyperlipidemia, atrial fibrillation on Eliquis, CAD, OSA, GERD, history of food impaction, CKD, history of melanoma without metastasis, history of colonic polyps, B12 and others listed below presents for evaluation of IDA.    10/11/2009 unremarkable colonoscopy 02/05/2023 EGD Dr. Orvan Falconer in the ER for food impaction 03/17/2023 EGD Dr. Marina Goodell distal esophageal stricture dilated to 19 mm, torturous esophagus, large hiatal hernia, benign-appearing 13 mm firm submucosal lesion   06/27/2023  HGB 9.9 (10.6 on 09/09, 12.7 on 04/26, 14.1 on 01/16) MCV 74.6 microcyctic Platelets 298 06/27/2023 Iron 37 Ferritin 10.5 B12 548 Started on Protonix 40 mg daily and started on ferrous sulfate 65 mg every other day 06/27/2023.   Recent Labs (within last 365 days)        Recent Labs    10/26/22 0838 02/04/23 2028 06/20/23 1113 06/27/23 1658  HGB 14.1 12.7* 10.6* 9.9*      Discussed the use of AI scribe  software for clinical note transcription with the patient, who gave verbal consent to proceed.   The patient, with a history of atrial fibrillation, hiatal hernia, and recent esophageal dilation, presents for evaluation of anemia.  They were found to be anemic during preoperative workup  for a planned cardioversion for atrial fibrillation. Their hemoglobin has dropped four grams since the beginning of the year.  Denies melena, hematochezia, GERD, dysphagia, AB pain.  No changes in his bowel habits.  They do report some shortness of breath with exertion. They have not noticed any significant weight loss. They have a history of back surgery and neuropathy in their legs and feet, for which they have been receiving treatment.       He  reports that he has quit smoking. His smoking use included cigarettes. He has never used smokeless tobacco. He reports that he does not drink alcohol and does not use drugs.   RELEVANT LABS AND IMAGING: CBC Labs (Brief)          Component Value Date/Time    WBC 6.0 06/27/2023 1658    RBC 4.77 06/27/2023 1658    HGB 9.9 (L) 06/27/2023 1658    HCT 35.6 (L) 06/27/2023 1658    PLT 298 06/27/2023 1658    MCV 74.6 (L) 06/27/2023 1658    MCH 20.8 (L) 06/27/2023 1658    MCHC 27.8 (L) 06/27/2023 1658    RDW 16.4 (H) 06/27/2023 1658    LYMPHSABS 1.2 05/07/2021 1834    MONOABS 0.7 05/07/2021 1834    EOSABS 0.2 05/07/2021 1834    BASOSABS 0.0 05/07/2021 1834      Recent Labs (within last 365 days)        Recent Labs    10/26/22 0838 02/04/23 2028 06/20/23 1113 06/27/23 1658  HGB 14.1 12.7* 10.6* 9.9*        CMP     Labs (Brief)          Component Value Date/Time    NA 138 06/27/2023 1219    NA 141 06/09/2023 0859    K 4.8 06/27/2023 1219    CL 103 06/27/2023 1219    CO2 27 06/27/2023 1219    GLUCOSE 164 (H) 06/27/2023 1219    BUN 26 (H) 06/27/2023 1219    BUN 21 06/09/2023 0859    CREATININE 1.13 06/27/2023 1219    CALCIUM 8.9 06/27/2023 1219    PROT 6.6 06/27/2023 1219    PROT 6.6 12/22/2022 0950    ALBUMIN 3.9 06/27/2023 1219    AST 18 06/27/2023 1219    ALT 15 06/27/2023 1219    ALKPHOS 54 06/27/2023 1219    BILITOT 0.6 06/27/2023 1219    GFRNONAA >60 06/17/2023 1100    GFRAA >60 04/25/2016 1345          Latest  Ref Rng & Units 06/27/2023   12:19 PM 02/04/2023    8:28 PM 12/22/2022    9:50 AM  Hepatic Function  Total Protein 6.0 - 8.3 g/dL 6.6  7.1  6.6   Albumin 3.5 - 5.2 g/dL 3.9  4.4     AST 0 - 37 U/L 18  23     ALT 0 - 53 U/L 15  12     Alk Phosphatase 39 - 117 U/L 54  56     Total Bilirubin 0.2 - 1.2 mg/dL 0.6  0.8     Bilirubin, Direct 0.0 - 0.2 mg/dL   0.2  Current Medications:      Current Outpatient Medications (Cardiovascular):    furosemide (LASIX) 20 MG tablet, Take 1 tablet (20 mg total) by mouth daily for 5 days, THEN 1 tablet (20 mg total) daily as needed for edema. (Patient taking differently: Take 1 tablet (20 mg total) daily in the morning.)   losartan (COZAAR) 50 MG tablet, TAKE 1 TABLET BY MOUTH DAILY   metoprolol tartrate (LOPRESSOR) 25 MG tablet, TAKE 1/2 TABLET BY MOUTH TWICE A DAY   pravastatin (PRAVACHOL) 80 MG tablet, Take 1 tablet (80 mg total) by mouth every evening.   Current Outpatient Medications (Respiratory):    fluticasone (FLONASE) 50 MCG/ACT nasal spray, Place 1 spray into both nostrils daily as needed for allergies or rhinitis.   Current Outpatient Medications (Analgesics):    acetaminophen (TYLENOL) 650 MG CR tablet, Take 650-1,300 mg by mouth daily as needed for pain.   traMADol (ULTRAM) 50 MG tablet, Take 50 mg by mouth daily as needed for moderate pain or severe pain.   Current Outpatient Medications (Hematological):    Cyanocobalamin (B-12 PO), Take 1 tablet by mouth daily.   ELIQUIS 5 MG TABS tablet, TAKE 1 TABLET BY MOUTH TWICE A DAY   ferrous sulfate 325 (65 FE) MG EC tablet, Take 325 mg by mouth every other day.   Current Outpatient Medications (Other):    Ascorbic Acid (VITAMIN C PO), Take by mouth.   Blood Glucose Monitoring Suppl (ONE TOUCH ULTRA 2) w/Device KIT, Use daily to check blood sugar.   FLUoxetine (PROZAC) 40 MG capsule, TAKE 1 CAPSULE BY MOUTH DAILY   Lancets (ONETOUCH ULTRASOFT) lancets, USE TO TEST BLOOD SUGAR DAILY    MAGNESIUM PO, Take 1 tablet by mouth daily.   Na Sulfate-K Sulfate-Mg Sulf 17.5-3.13-1.6 GM/177ML SOLN, Take 1 kit by mouth once for 1 dose.   NON FORMULARY, Take 1 Dose by mouth in the morning and at bedtime. Neuropathy Blend Nerve Formula Liquid   Omega-3 Fatty Acids (FISH OIL PO), Take 1 capsule by mouth daily.   ONETOUCH ULTRA TEST test strip, USE TO TEST BLOOD SUGAR AS DIRECTED   pantoprazole (PROTONIX) 40 MG tablet, Take 1 tablet (40 mg total) by mouth daily.   Medical History:      Past Medical History:  Diagnosis Date   Atrial fibrillation (HCC)     Cancer (HCC)      hx - melanoma   Chicken pox     Colon polyps     Coronary artery disease     Depression      No need for therapy.  Improved   Diabetes (HCC)     Diverticula, colon     Duodenal nodule     Dyspnea      with exertion   GERD (gastroesophageal reflux disease)     Hemorrhoids     Hyperlipidemia     Hypertension     Sepsis (HCC)     Sepsis (HCC)     Sleep apnea     Tinnitus     UTI (lower urinary tract infection)          Allergies:  Allergies       Allergies  Allergen Reactions   Gabapentin Other (See Comments)      Lethargic and depressed, aching   Lipitor [Atorvastatin Calcium]        Muscle ache        Surgical History:  He  has a past surgical history that includes Melanoma excision; Melanoma  excision; Cardiac catheterization; Lumbar laminectomy/decompression microdiscectomy (N/A, 11/08/2022); Esophagogastroduodenoscopy (N/A, 02/05/2023); and Foreign Body Removal (02/05/2023). Family History:  His family history includes Heart Problems in his mother.   REVIEW OF SYSTEMS  : All other systems reviewed and negative except where noted in the History of Present Illness.   PHYSICAL EXAM: BP 100/70   Pulse 91   Ht 6' (1.829 m)   Wt 238 lb (108 kg)   BMI 32.28 kg/m  Physical Exam   MEASUREMENTS: WT- 238 NECK: No adenopathy palpated. Thyroid gland normal on palpation. CARDIOVASCULAR: Heart  rhythm irregular, suggestive of atrial fibrillation. No murmurs detected. ABDOMEN: Bowel sounds normal on auscultation. Abdomen non-tender on palpation. Normal percussion. RECTAL: Redness and erythema around anal cleft. No skin tags, external hemorrhoids, or masses palpated. Stool brown, very faint positive for blood. Prostate normal on palpation.           Doree Albee, PA-C 2:27 PM             Patient Instructions by Sunday Spillers, CMA at 07/07/2023 1:30 PM  Author: Sunday Spillers, CMA Author Type: Certified Medical Assistant Filed: 07/07/2023  1:59 PM  Note Status: Addendum Cosign: Cosign Not Required Encounter Date: 07/07/2023  Editor: Sunday Spillers, CMA (Certified Medical Assistant)      Prior Versions: 1. Sunday Spillers, CMA (Certified Medical Assistant) at 07/07/2023  1:58 PM - Addendum   2. Javier Glazier (Physician Assistant Certified) at 07/07/2023  1:57 PM - Signed  You have been scheduled for a colonoscopy. Please follow written instructions given to you at your visit today.    Please pick up your prep supplies at the pharmacy within the next 1-3 days.   If you use inhalers (even only as needed), please bring them with you on the day of your procedure.   DO NOT TAKE 7 DAYS PRIOR TO TEST- Trulicity (dulaglutide) Ozempic, Wegovy (semaglutide) Mounjaro (tirzepatide) Bydureon Bcise (exanatide extended release)   DO NOT TAKE 1 DAY PRIOR TO YOUR TEST Rybelsus (semaglutide) Adlyxin (lixisenatide) Victoza (liraglutide) Byetta (exanatide) ___________________________________________________________________________   Your provider has requested that you go to the basement level for lab work before leaving today. Press "B" on the elevator. The lab is located at the first door on the left as you exit the elevator.     VISIT SUMMARY:   During your recent visit, we discussed your anemia, atrial fibrillation, hiatal hernia, and neuropathy. Your hemoglobin  levels have dropped significantly over the past few months, which is a concern. We also noted an increase in your atrial fibrillation burden. However, your neuropathy seems to be improving with the current treatment.   YOUR PLAN:   -IRON DEFICIENCY ANEMIA: This is a condition where your body lacks enough iron to produce hemoglobin, the part of your red blood cells that carries oxygen. We will schedule an upper endoscopy and colonoscopy with Dr. Marina Goodell to investigate further. We will also repeat some blood tests in 1-2 weeks to see how you are responding to iron supplementation.   -ATRIAL FIBRILLATION: This is a heart condition that causes an irregular and often rapid heart rate. You should continue taking Eliquis as prescribed for stroke prevention, but we will need to hold it for 2 days prior to your endoscopy and colonoscopy procedures, with your cardiologist's approval.   -HIATAL HERNIA: This is a condition where part of your stomach bulges up through your diaphragm into your chest. You should continue taking Pantoprazole daily, and we  will reevaluate your condition during the upcoming endoscopy.   INSTRUCTIONS:   Please schedule an upper endoscopy and colonoscopy with Dr. Marina Goodell. Also, get permission from your cardiologist to hold Eliquis for 2 days prior to these procedures. We will repeat some blood tests in 1-2 weeks to see how you are responding to iron supplementation. If the endoscopy and colonoscopy do not reveal the cause of your anemia, we may consider a capsule endoscopy.  Recent H&P as above.  No interval change.  Now for colonoscopy and upper endoscopy with possible esophageal dilation.

## 2023-08-05 NOTE — Op Note (Signed)
[facility] Patient Name: Brent Taylor Procedure Date: 08/05/2023 1:40 PM MRN: [id number] Endoscopist: Wilhemina Bonito. Marina Goodell , MD, [id number] Age: 82 Referring MD:  Date of Birth: 24-Oct-1940 Gender: Male Account #: [id number] Procedure:                Colonoscopy with cold snare polypectomy x 6 Indications:              Heme positive stool, Iron deficiency anemia. Remote                            colonoscopy elsewhere (greater than 10 years) Medicines:                Monitored Anesthesia Care Procedure:                Pre-Anesthesia Assessment:                           - Prior to the procedure, a History and Physical                            was performed, and patient medications and                            allergies were reviewed. The patient's tolerance of                            previous anesthesia was also reviewed. The risks                            and benefits of the procedure and the sedation                            options and risks were discussed with the patient.                            All questions were answered, and informed consent                            was obtained. Prior Anticoagulants: The patient has                            taken Eliquis (apixaban), last dose was 2 days                            prior to procedure. ASA Grade Assessment: III - A                            patient with severe systemic disease. After                            reviewing the risks and benefits, the patient was                            deemed in satisfactory condition to undergo the  procedure.                           After obtaining informed consent, the colonoscope                            was passed under direct vision. Throughout the                            procedure, the patient's blood pressure, pulse, and                            oxygen saturations were monitored continuously. The                            CF [id number]  #[id number] was introduced through the anus                            and advanced to the the cecum, identified by                            appendiceal orifice and ileocecal valve. The                            ileocecal valve, appendiceal orifice, and rectum                            were photographed. The quality of the bowel                            preparation was excellent. The colonoscopy was                            performed with difficulty due to significant                            left-sided diverticular disease and a redundant                            colon. The patient tolerated the procedure well.                            The bowel preparation used was SUPREP via split                            dose instruction. Scope In: 1:51:55 PM Scope Out: 2:23:09 PM Scope Withdrawal Time: 0 hours 12 minutes 44 seconds  Total Procedure Duration: 0 hours 31 minutes 14 seconds  Findings:                 Six polyps were found in the ascending colon and                            cecum. The polyps were 5 to 15 mm in size. These  polyps were removed with a cold snare. Resection                            and retrieval were complete.                           Many diverticula were found in the left colon.                           Internal hemorrhoids were found during retroflexion.                           The exam was otherwise without abnormality on                            direct and retroflexion views. Complications:            No immediate complications. Estimated blood loss:                            None. Estimated Blood Loss:     Estimated blood loss: none. Impression:               - Six 5 to 15 mm polyps in the ascending colon and                            in the cecum, removed with a cold snare. Resected                            and retrieved.                           - Diverticulosis in the left colon.                           -  Internal hemorrhoids.                           - The examination was otherwise normal on direct                            and retroflexion views. Recommendation:           - Repeat colonoscopy is not recommended for                            surveillance.                           - Resume Eliquis (apixaban) tomorrow at prior dose.                           - Patient has a contact number available for                            emergencies. The signs and symptoms of potential  delayed complications were discussed with the                            patient. Return to normal activities tomorrow.                            Written discharge instructions were provided to the                            patient.                           - Resume previous diet.                           - Continue present medications.                           - Await pathology results. Wilhemina Bonito. Marina Goodell, MD 08/05/2023 2:35:01 PM This report has been signed electronically.

## 2023-08-05 NOTE — Progress Notes (Signed)
Called to room to assist during endoscopic procedure.  Patient ID and intended procedure confirmed with present staff. Received instructions for my participation in the procedure from the performing physician.  

## 2023-08-05 NOTE — Patient Instructions (Addendum)
You may resume your Eliquis as normal tomorrow 08/06/23  YOU HAD AN ENDOSCOPIC PROCEDURE TODAY AT THE [facility]:   Refer to the procedure report that was given to you for any specific questions about what was found during the examination.  If the procedure report does not answer your questions, please call your gastroenterologist to clarify.  If you requested that your care partner not be given the details of your procedure findings, then the procedure report has been included in a sealed envelope for you to review at your convenience later.  YOU SHOULD EXPECT: Some feelings of bloating in the abdomen. Passage of more gas than usual.  Walking can help get rid of the air that was put into your GI tract during the procedure and reduce the bloating. If you had a lower endoscopy (such as a colonoscopy or flexible sigmoidoscopy) you may notice spotting of blood in your stool or on the toilet paper. If you underwent a bowel prep for your procedure, you may not have a normal bowel movement for a few days.  Please Note:  You might notice some irritation and congestion in your nose or some drainage.  This is from the oxygen used during your procedure.  There is no need for concern and it should clear up in a day or so.  SYMPTOMS TO REPORT IMMEDIATELY:  Following lower endoscopy (colonoscopy or flexible sigmoidoscopy):  Excessive amounts of blood in the stool  Significant tenderness or worsening of abdominal pains  Swelling of the abdomen that is new, acute  Fever of 100F or higher  Following upper endoscopy (EGD)  Vomiting of blood or coffee ground material  New chest pain or pain under the shoulder blades  Painful or persistently difficult swallowing  New shortness of breath  Fever of 100F or higher  Black, tarry-looking stools  For urgent or emergent issues, a gastroenterologist can be reached at any hour by calling [phone]. Do not use MyChart messaging for urgent concerns.     DIET:  We do recommend a small meal at first, but then you may proceed to your regular diet.  Drink plenty of fluids but you should avoid alcoholic beverages for 24 hours.  ACTIVITY:  You should plan to take it easy for the rest of today and you should NOT DRIVE or use heavy machinery until tomorrow (because of the sedation medicines used during the test).    FOLLOW UP: Our staff will call the number listed on your records the next business day following your procedure.  We will call around 7:15- 8:00 am to check on you and address any questions or concerns that you may have regarding the information given to you following your procedure. If we do not reach you, we will leave a message.     If any biopsies were taken you will be contacted by phone or by letter within the next 1-3 weeks.  Please call us at [phone] if you have not heard about the biopsies in 3 weeks.   SIGNATURES/CONFIDENTIALITY: You and/or your care partner have signed paperwork which will be entered into your electronic medical record.  These signatures attest to the fact that that the information above on your After Visit Summary has been reviewed and is understood.  Full responsibility of the confidentiality of this discharge information lies with you and/or your care-partner.

## 2023-08-08 ENCOUNTER — Telehealth: Payer: Self-pay

## 2023-08-08 NOTE — Telephone Encounter (Signed)
  Follow up Call-     08/05/2023    1:15 PM 03/17/2023    9:12 AM 03/17/2023    9:08 AM  Call back number  Post procedure Call Back phone  # [phone]   Permission to leave phone message Yes  Yes     Patient questions:  Do you have a fever, pain , or abdominal swelling? No. Pain Score  0 *  Have you tolerated food without any problems? Yes.    Have you been able to return to your normal activities? Yes.    Do you have any questions about your discharge instructions: Diet   No. Medications  No. Follow up visit  No.  Do you have questions or concerns about your Care? No.  Actions: * If pain score is 4 or above: No action needed, pain <4.

## 2023-08-10 ENCOUNTER — Encounter: Payer: Self-pay | Admitting: Internal Medicine

## 2023-08-10 LAB — SURGICAL PATHOLOGY

## 2023-08-12 ENCOUNTER — Telehealth: Payer: Self-pay

## 2023-08-12 ENCOUNTER — Encounter: Payer: Self-pay | Admitting: Family Medicine

## 2023-08-12 NOTE — Telephone Encounter (Signed)
Transition Care Management Unsuccessful Follow-up Telephone Call  Date of discharge and from where:  [facility] 9/27  Attempts:  1st Attempt  Reason for unsuccessful TCM follow-up call:  No answer/busy   Lenard Forth [place]  [facility], [facility] Guide, Phone: [phone] Website: [email or web address]

## 2023-08-15 ENCOUNTER — Encounter: Payer: Self-pay | Admitting: Hematology and Oncology

## 2023-08-15 ENCOUNTER — Telehealth: Payer: Self-pay

## 2023-08-15 NOTE — Telephone Encounter (Signed)
Transition Care Management Unsuccessful Follow-up Telephone Call  Date of discharge and from where:  [facility] 9/27  Attempts:  2nd Attempt  Reason for unsuccessful TCM follow-up call:  No answer/busy      Brent Taylor  [facility], [facility] Guide, Phone: [phone] Website: [email or web address]

## 2023-08-19 DIAGNOSIS — R69 Illness, unspecified: Secondary | ICD-10-CM | POA: Diagnosis not present

## 2023-08-26 ENCOUNTER — Encounter (HOSPITAL_COMMUNITY): Payer: Self-pay | Admitting: Physician Assistant

## 2023-08-26 ENCOUNTER — Ambulatory Visit (HOSPITAL_COMMUNITY)
Admission: RE | Admit: 2023-08-26 | Discharge: 2023-08-26 | Disposition: A | Discharge status: Home / Self Care | Payer: Medicare HMO | Source: Ambulatory Visit | Attending: Physician Assistant | Admitting: Physician Assistant

## 2023-08-26 VITALS — BP 118/88 | HR 86 | Ht 72.0 in | Wt 231.4 lb

## 2023-08-26 DIAGNOSIS — G4733 Obstructive sleep apnea (adult) (pediatric): Secondary | ICD-10-CM | POA: Diagnosis not present

## 2023-08-26 DIAGNOSIS — D6869 Other thrombophilia: Secondary | ICD-10-CM | POA: Insufficient documentation

## 2023-08-26 DIAGNOSIS — I4819 Other persistent atrial fibrillation: Secondary | ICD-10-CM | POA: Diagnosis not present

## 2023-08-26 DIAGNOSIS — Z79899 Other long term (current) drug therapy: Secondary | ICD-10-CM | POA: Diagnosis not present

## 2023-08-26 DIAGNOSIS — I2510 Atherosclerotic heart disease of native coronary artery without angina pectoris: Secondary | ICD-10-CM | POA: Insufficient documentation

## 2023-08-26 DIAGNOSIS — Z7901 Long term (current) use of anticoagulants: Secondary | ICD-10-CM | POA: Insufficient documentation

## 2023-08-26 DIAGNOSIS — D649 Anemia, unspecified: Secondary | ICD-10-CM | POA: Diagnosis not present

## 2023-08-26 DIAGNOSIS — E119 Type 2 diabetes mellitus without complications: Secondary | ICD-10-CM | POA: Insufficient documentation

## 2023-08-26 NOTE — Progress Notes (Signed)
Primary Care Physician: [name], MD Primary Cardiologist: [name], MD Electrophysiologist: None  Sleep Medicine: Dr Mayford Knife Referring Physician: Dr [name] is a 82 y.o. male with a history of CAD, HLD, OSA, DM, atrial fibrillation who presents for follow up in the [facility]. Patient is on Eliquis for a CHADS2VASC score of 5. The patient was seen by Dr [name] on 05/25/23 with increased DOE and lower extremity edema. He was in afib at that time. A Zio monitor was placed which showed 100% afib burden. His last ECG in SR was January of 2023.  On follow up today, patient was scheduled for DCCV but this was cancelled due to acute anemia. His hgb nadir was 9.9. He was evaluated by GI and underwent a colonoscopy and endoscopy about two weeks ago which did not show any acute bleeding issues per patient report. He does report that he feels a little better since starting iron supplement. He remains in rate controlled afib.   Today, he denies symptoms of palpitations, chest pain, orthopnea, PND, dizziness, presyncope, syncope, bleeding, or neurologic sequela. The patient is tolerating medications without difficulties and is otherwise without complaint today.    Atrial Fibrillation Risk Factors:  he does have symptoms or diagnosis of sleep apnea. he is compliant with CPAP therapy. he does not have a history of rheumatic fever.   Atrial Fibrillation Management history:  Previous antiarrhythmic drugs: none Previous cardioversions: none Previous ablations: none Anticoagulation history: Eliquis  ROS- All systems are reviewed and negative except as per the HPI above.  Past Medical History:  Diagnosis Date   Atrial fibrillation (HCC)    Cancer (HCC)    hx - melanoma   Chicken pox    Colon polyps    Coronary artery disease    Depression    No need for therapy.  Improved   Diabetes (HCC)    Diverticula, colon    Duodenal  nodule    Dyspnea    with exertion   GERD (gastroesophageal reflux disease)    Hemorrhoids    Hyperlipidemia    Hypertension    Sepsis (HCC)    Sepsis (HCC)    Sleep apnea    Tinnitus    UTI (lower urinary tract infection)     Current Outpatient Medications  Medication Sig Dispense Refill   acetaminophen (TYLENOL) 650 MG CR tablet Take 650-1,300 mg by mouth daily as needed for pain.     Ascorbic Acid (VITAMIN C PO) Take by mouth.     Blood Glucose Monitoring Suppl (ONE TOUCH ULTRA 2) w/Device KIT Use daily to check blood sugar. 1 kit 0   Cyanocobalamin (B-12 PO) Take 1 tablet by mouth daily.     ELIQUIS 5 MG TABS tablet TAKE 1 TABLET BY MOUTH TWICE A DAY 60 tablet 5   ferrous sulfate 325 (65 FE) MG EC tablet Take 325 mg by mouth every other day.     FLUoxetine (PROZAC) 40 MG capsule TAKE 1 CAPSULE BY MOUTH DAILY 90 capsule 3   fluticasone (FLONASE) 50 MCG/ACT nasal spray Place 1 spray into both nostrils daily as needed for allergies or rhinitis.     furosemide (LASIX) 20 MG tablet Take 20 mg by mouth daily.     Lancets (ONETOUCH ULTRASOFT) lancets USE TO TEST BLOOD SUGAR DAILY 100 each 12   losartan (COZAAR) 50 MG tablet TAKE 1 TABLET BY MOUTH DAILY 90 tablet 0   MAGNESIUM PO  Take 1 tablet by mouth daily.     metoprolol tartrate (LOPRESSOR) 25 MG tablet TAKE 1/2 TABLET BY MOUTH TWICE A DAY 90 tablet 3   ondansetron (ZOFRAN-ODT) 4 MG disintegrating tablet Take 1 tablet (4 mg total) by mouth every 8 (eight) hours as needed for nausea or vomiting. 12 tablet 0   ONETOUCH ULTRA TEST test strip USE TO TEST BLOOD SUGAR AS DIRECTED 100 strip 3   pantoprazole (PROTONIX) 40 MG tablet Take 1 tablet (40 mg total) by mouth daily. 90 tablet 3   pravastatin (PRAVACHOL) 80 MG tablet Take 1 tablet (80 mg total) by mouth every evening. 90 tablet 3   traMADol (ULTRAM) 50 MG tablet Take 50 mg by mouth daily as needed for moderate pain or severe pain.     No current facility-administered medications  for this encounter.    Physical Exam: BP 118/88   Pulse 86   Ht 6' (1.829 m)   Wt 105 kg   BMI 31.38 kg/m   GEN: Well nourished, well developed in no acute distress NECK: No JVD; No carotid bruits CARDIAC: Irregularly irregular rate and rhythm, no murmurs, rubs, gallops RESPIRATORY:  Clear to auscultation without rales, wheezing or rhonchi  ABDOMEN: Soft, non-tender, non-distended EXTREMITIES:  No edema; No deformity    Wt Readings from Last 3 Encounters:  08/26/23 105 kg  08/05/23 107 kg  08/01/23 107.4 kg     EKG today demonstrates  Afib, LAFB Vent. rate 86 BPM PR interval * ms QRS duration 108 ms QT/QTcB 376/449 ms  Echo 12/23/22 demonstrated   1. Left ventricular ejection fraction, by estimation, is 50 to 55%. The  left ventricle has low normal function. The left ventricle has no regional  wall motion abnormalities. There is mild concentric left ventricular  hypertrophy. Left ventricular diastolic parameters are indeterminate.   2. Right ventricular systolic function is normal. The right ventricular  size is normal. There is normal pulmonary artery systolic pressure.   3. Left atrial size was severely dilated.   4. Right atrial size was severely dilated.   5. The mitral valve is normal in structure. Mild mitral valve  regurgitation. No evidence of mitral stenosis.   6. The aortic valve is tricuspid. Aortic valve regurgitation is not  visualized. No aortic stenosis is present.   7. Aortic dilatation noted. There is moderate dilatation of the ascending  aorta, measuring 46 mm.   8. The inferior vena cava is normal in size with greater than 50%  respiratory variability, suggesting right atrial pressure of 3 mmHg.    CHA2DS2-VASc Score = 5  The patient's score is based upon: CHF History: 0 HTN History: 1 Diabetes History: 1 Stroke History: 0 Vascular Disease History: 1 Age Score: 2 Gender Score: 0       ASSESSMENT AND PLAN: Persistent Atrial Fibrillation  (ICD10:  I48.19) The patient's CHA2DS2-VASc score is 5, indicating a 7.2% annual risk of stroke.   Patient remains in rate controlled afib. We discussed rate vs rhythm control again today. He does feel a little better since starting iron. Difficult to know how much of his recent symptoms are from anemia and how much are from afib. Will plan to follow up after his visit with his PCP in January. If he remains symptomatic despite treatment of his anemia, will plan for rhythm control with DCCV. Patient will call clinic in the interim if his symptoms worsen. Patient in agreement with plan.  Continue Eliquis 5 mg BID  Continue  Lopressor 12.5 mg BID  Secondary Hypercoagulable State (ICD10:  D68.69) The patient is at significant risk for stroke/thromboembolism based upon his CHA2DS2-VASc Score of 5.  Continue Apixaban (Eliquis).   OSA  Encouraged nightly CPAP Followed by Dr Mayford Knife  CAD On statin No anginal symptoms    Follow up in the AF clinic in 3 months.       Jorja Loa PA-C Afib Clinic [facility] [address] [place], [place] [address] [phone]

## 2023-09-02 DIAGNOSIS — G4733 Obstructive sleep apnea (adult) (pediatric): Secondary | ICD-10-CM | POA: Diagnosis not present

## 2023-09-06 NOTE — Telephone Encounter (Signed)
Wanya from [place] [facility] called for an update on order. I informed caller that we are waiting for patient verification. Caller states patient is the one who opened this account. I informed caller that I understand but we will still need patient to verify. Caller verbalized understanding.

## 2023-09-13 ENCOUNTER — Encounter: Payer: Self-pay | Admitting: Family Medicine

## 2023-09-14 NOTE — Telephone Encounter (Signed)
Noted  

## 2023-09-19 ENCOUNTER — Telehealth: Payer: Self-pay | Admitting: Family Medicine

## 2023-09-19 NOTE — Telephone Encounter (Signed)
Brent Taylor with [employer] states that the Order for the Continuous Glucose Monitor that was received was missing a date.  Requests the form be completed with date and be faxed along with Medical Records to Fax# [phone]

## 2023-09-21 NOTE — Telephone Encounter (Signed)
Faxed to number provider in message

## 2023-09-21 NOTE — Telephone Encounter (Signed)
Form faxed to [phone]

## 2023-09-21 NOTE — Telephone Encounter (Signed)
Please send to Fax# requested in message

## 2023-09-22 DIAGNOSIS — E1159 Type 2 diabetes mellitus with other circulatory complications: Secondary | ICD-10-CM | POA: Diagnosis not present

## 2023-09-26 NOTE — Telephone Encounter (Signed)
Caller states form has not been received. Requests form be faxed to [phone]

## 2023-09-27 NOTE — Telephone Encounter (Signed)
Caller states they are in urgent need of the last OV chart notes for patient. States they need these faxed to [phone] in order to get this order completed.

## 2023-09-27 NOTE — Telephone Encounter (Signed)
Form faxed placed to be scan inpatient chart

## 2023-09-28 NOTE — Telephone Encounter (Signed)
Records given to front office to be faxed. Faxed multiple times fax fail

## 2023-09-29 NOTE — Telephone Encounter (Signed)
Records faxed to [phone]

## 2023-10-01 ENCOUNTER — Other Ambulatory Visit: Payer: Self-pay | Admitting: Family Medicine

## 2023-10-02 DIAGNOSIS — G4733 Obstructive sleep apnea (adult) (pediatric): Secondary | ICD-10-CM | POA: Diagnosis not present

## 2023-10-05 ENCOUNTER — Other Ambulatory Visit: Payer: Self-pay | Admitting: Internal Medicine

## 2023-10-20 ENCOUNTER — Other Ambulatory Visit: Payer: Self-pay | Admitting: Internal Medicine

## 2023-10-20 DIAGNOSIS — I2510 Atherosclerotic heart disease of native coronary artery without angina pectoris: Secondary | ICD-10-CM

## 2023-10-20 DIAGNOSIS — B078 Other viral warts: Secondary | ICD-10-CM | POA: Diagnosis not present

## 2023-10-20 DIAGNOSIS — I480 Paroxysmal atrial fibrillation: Secondary | ICD-10-CM

## 2023-10-20 DIAGNOSIS — C44629 Squamous cell carcinoma of skin of left upper limb, including shoulder: Secondary | ICD-10-CM | POA: Diagnosis not present

## 2023-10-22 DIAGNOSIS — E1159 Type 2 diabetes mellitus with other circulatory complications: Secondary | ICD-10-CM | POA: Diagnosis not present

## 2023-10-26 ENCOUNTER — Telehealth: Payer: Self-pay | Admitting: Family Medicine

## 2023-10-26 NOTE — Telephone Encounter (Unsigned)
 Copied from CRM [id number]. Topic: General - Other >> Oct 26, 2023  8:51 AM Juleen Oakland F wrote: Reason for CRM: [place]  [facility] called, says they received patients Diabetic Supply form on 1/8 but the form is missing the date it was sign. Asked to please send over asap so this doesn't delay patients supplies

## 2023-10-26 NOTE — Telephone Encounter (Signed)
 Form Refaxed to [phone]

## 2023-11-01 ENCOUNTER — Encounter: Payer: Self-pay | Admitting: Family Medicine

## 2023-11-01 ENCOUNTER — Ambulatory Visit (INDEPENDENT_AMBULATORY_CARE_PROVIDER_SITE_OTHER): Payer: PPO | Admitting: Family Medicine

## 2023-11-01 VITALS — BP 125/79 | HR 52 | Temp 97.2°F | Ht 72.0 in | Wt 233.6 lb

## 2023-11-01 DIAGNOSIS — I480 Paroxysmal atrial fibrillation: Secondary | ICD-10-CM | POA: Diagnosis not present

## 2023-11-01 DIAGNOSIS — E538 Deficiency of other specified B group vitamins: Secondary | ICD-10-CM | POA: Diagnosis not present

## 2023-11-01 DIAGNOSIS — F325 Major depressive disorder, single episode, in full remission: Secondary | ICD-10-CM | POA: Diagnosis not present

## 2023-11-01 DIAGNOSIS — I77819 Aortic ectasia, unspecified site: Secondary | ICD-10-CM

## 2023-11-01 DIAGNOSIS — E1169 Type 2 diabetes mellitus with other specified complication: Secondary | ICD-10-CM | POA: Diagnosis not present

## 2023-11-01 DIAGNOSIS — E559 Vitamin D deficiency, unspecified: Secondary | ICD-10-CM

## 2023-11-01 DIAGNOSIS — N529 Male erectile dysfunction, unspecified: Secondary | ICD-10-CM

## 2023-11-01 DIAGNOSIS — E785 Hyperlipidemia, unspecified: Secondary | ICD-10-CM

## 2023-11-01 DIAGNOSIS — D509 Iron deficiency anemia, unspecified: Secondary | ICD-10-CM | POA: Diagnosis not present

## 2023-11-01 DIAGNOSIS — Z0001 Encounter for general adult medical examination with abnormal findings: Secondary | ICD-10-CM | POA: Diagnosis not present

## 2023-11-01 DIAGNOSIS — K219 Gastro-esophageal reflux disease without esophagitis: Secondary | ICD-10-CM | POA: Diagnosis not present

## 2023-11-01 DIAGNOSIS — I152 Hypertension secondary to endocrine disorders: Secondary | ICD-10-CM

## 2023-11-01 DIAGNOSIS — G6289 Other specified polyneuropathies: Secondary | ICD-10-CM

## 2023-11-01 DIAGNOSIS — E1159 Type 2 diabetes mellitus with other circulatory complications: Secondary | ICD-10-CM | POA: Diagnosis not present

## 2023-11-01 LAB — COMPREHENSIVE METABOLIC PANEL
ALT: 15 U/L (ref 0–53)
AST: 19 U/L (ref 0–37)
Albumin: 4.5 g/dL (ref 3.5–5.2)
Alkaline Phosphatase: 61 U/L (ref 39–117)
BUN: 16 mg/dL (ref 6–23)
CO2: 28 meq/L (ref 19–32)
Calcium: 9.5 mg/dL (ref 8.4–10.5)
Chloride: 104 meq/L (ref 96–112)
Creatinine, Ser: 1.1 mg/dL (ref 0.40–1.50)
GFR: 61.45 mL/min (ref >60.00)
Glucose, Bld: 133 mg/dL — ABNORMAL HIGH (ref 70–99)
Potassium: 4.8 meq/L (ref 3.5–5.1)
Sodium: 142 meq/L (ref 135–145)
Total Bilirubin: 0.4 mg/dL (ref 0.2–1.2)
Total Protein: 6.9 g/dL (ref 6.0–8.3)

## 2023-11-01 LAB — CBC
HCT: 50.7 % (ref 39.0–52.0)
Hemoglobin: 16.1 g/dL (ref 13.0–17.0)
MCHC: 31.8 g/dL (ref 30.0–36.0)
MCV: 90.6 fL (ref 78.0–100.0)
Platelets: 186 10*3/uL (ref 150.0–400.0)
RBC: 5.6 Mil/uL (ref 4.22–5.81)
RDW: 17.1 % — ABNORMAL HIGH (ref 11.5–15.5)
WBC: 6.4 10*3/uL (ref 4.0–10.5)

## 2023-11-01 LAB — VITAMIN D 25 HYDROXY (VIT D DEFICIENCY, FRACTURES): VITD: 24.23 ng/mL — ABNORMAL LOW (ref 30.00–100.00)

## 2023-11-01 LAB — LIPID PANEL
Cholesterol: 162 mg/dL (ref 0–200)
HDL: 35.3 mg/dL — ABNORMAL LOW (ref >39.00)
LDL Cholesterol: 97 mg/dL (ref 0–99)
NonHDL: 126.32
Total CHOL/HDL Ratio: 5
Triglycerides: 149 mg/dL (ref 0.0–149.0)
VLDL: 29.8 mg/dL (ref 0.0–40.0)

## 2023-11-01 LAB — MICROALBUMIN / CREATININE URINE RATIO
Creatinine,U: 62 mg/dL
Microalb Creat Ratio: 14.1 mg/g (ref 0.0–30.0)
Microalb, Ur: 8.7 mg/dL — ABNORMAL HIGH (ref 0.0–1.9)

## 2023-11-01 LAB — IBC + FERRITIN
Ferritin: 20.7 ng/mL — ABNORMAL LOW (ref 22.0–322.0)
Iron: 60 ug/dL (ref 42–165)
Saturation Ratios: 17.5 % — ABNORMAL LOW (ref 20.0–50.0)
TIBC: 343 ug/dL (ref 250.0–450.0)
Transferrin: 245 mg/dL (ref 212.0–360.0)

## 2023-11-01 LAB — HEMOGLOBIN A1C: Hgb A1c MFr Bld: 6.5 % (ref 4.6–6.5)

## 2023-11-01 LAB — VITAMIN B12: Vitamin B-12: 1117 pg/mL — ABNORMAL HIGH (ref 211–911)

## 2023-11-01 LAB — TSH: TSH: 2.92 u[IU]/mL (ref 0.35–5.50)

## 2023-11-01 LAB — PSA: PSA: 1.08 ng/mL (ref 0.10–4.00)

## 2023-11-01 MED ORDER — TADALAFIL 20 MG PO TABS: 20.0000 mg | ORAL_TABLET | ORAL | 5 refills | Status: DC | PRN

## 2023-11-01 NOTE — Patient Instructions (Signed)
It was very nice to see you today!  We will check blood work today.  Please continue to work on diet and exercise.  We will send in Cialis 20 mg daily.  Return in about 1 year (around 10/31/2024) for Annual Physical.   Take care, Dr Jimmey Ralph  PLEASE NOTE:  If you had any lab tests, please let us know if you have not heard back within a few days. You may see your results on mychart before we have a chance to review them but we will give you a call once they are reviewed by [place].   If we ordered any referrals today, please let us know if you have not heard from their office within the next week.   If you had any urgent prescriptions sent in today, please check with the pharmacy within an hour of our visit to make sure the prescription was transmitted appropriately.   Please try these tips to maintain a healthy lifestyle:  Eat at least 3 REAL meals and 1-2 snacks per day.  Aim for no more than 5 hours between eating.  If you eat breakfast, please do so within one hour of getting up.   Each meal should contain half fruits/vegetables, one quarter protein, and one quarter carbs (no bigger than a computer mouse)  Cut down on sweet beverages. This includes juice, soda, and sweet tea.   Drink at least 1 glass of water with each meal and aim for at least 8 glasses per day  Exercise at least 150 minutes every week.    Preventive Care 75 Years and Older, Male Preventive care refers to lifestyle choices and visits with your health care provider that can promote health and wellness. Preventive care visits are also called wellness exams. What can I expect for my preventive care visit? Counseling During your preventive care visit, your health care provider may ask about your: Medical history, including: Past medical problems. Family medical history. History of falls. Current health, including: Emotional well-being. Home life and relationship well-being. Sexual activity. Memory and ability to  understand (cognition). Lifestyle, including: Alcohol, nicotine or tobacco, and drug use. Access to firearms. Diet, exercise, and sleep habits. Work and work Astronomer. Sunscreen use. Safety issues such as seatbelt and bike helmet use. Physical exam Your health care provider will check your: Height and weight. These may be used to calculate your BMI (body mass index). BMI is a measurement that tells if you are at a healthy weight. Waist circumference. This measures the distance around your waistline. This measurement also tells if you are at a healthy weight and may help predict your risk of certain diseases, such as type 2 diabetes and high blood pressure. Heart rate and blood pressure. Body temperature. Skin for abnormal spots. What immunizations do I need?  Vaccines are usually given at various ages, according to a schedule. Your health care provider will recommend vaccines for you based on your age, medical history, and lifestyle or other factors, such as travel or where you work. What tests do I need? Screening Your health care provider may recommend screening tests for certain conditions. This may include: Lipid and cholesterol levels. Diabetes screening. This is done by checking your blood sugar (glucose) after you have not eaten for a while (fasting). Hepatitis C test. Hepatitis B test. HIV (human immunodeficiency virus) test. STI (sexually transmitted infection) testing, if you are at risk. Lung cancer screening. Colorectal cancer screening. Prostate cancer screening. Abdominal aortic aneurysm (AAA) screening. You may need  this if you are a current or former smoker. Talk with your health care provider about your test results, treatment options, and if necessary, the need for more tests. Follow these instructions at home: Eating and drinking  Eat a diet that includes fresh fruits and vegetables, whole grains, lean protein, and low-fat dairy products. Limit your intake of  foods with high amounts of sugar, saturated fats, and salt. Take vitamin and mineral supplements as recommended by your health care provider. Do not drink alcohol if your health care provider tells you not to drink. If you drink alcohol: Limit how much you have to 0-2 drinks a day. Know how much alcohol is in your drink. In the [place] one drink equals one 12 oz bottle of beer (355 mL), one 5 oz glass of wine (148 mL), or one 1 oz glass of hard liquor (44 mL). Lifestyle Brush your teeth every morning and night with fluoride toothpaste. Floss one time each day. Exercise for at least 30 minutes 5 or more days each week. Do not use any products that contain nicotine or tobacco. These products include cigarettes, chewing tobacco, and vaping devices, such as e-cigarettes. If you need help quitting, ask your health care provider. Do not use drugs. If you are sexually active, practice safe sex. Use a condom or other form of protection to prevent STIs. Take aspirin only as told by your health care provider. Make sure that you understand how much to take and what form to take. Work with your health care provider to find out whether it is safe and beneficial for you to take aspirin daily. Ask your health care provider if you need to take a cholesterol-lowering medicine (statin). Find healthy ways to manage stress, such as: Meditation, yoga, or listening to music. Journaling. Talking to a trusted person. Spending time with friends and family. Safety Always wear your seat belt while driving or riding in a vehicle. Do not drive: If you have been drinking alcohol. Do not ride with someone who has been drinking. When you are tired or distracted. While texting. If you have been using any mind-altering substances or drugs. Wear a helmet and other protective equipment during sports activities. If you have firearms in your house, make sure you follow all gun safety procedures. Minimize exposure to UV  radiation to reduce your risk of skin cancer. What's next? Visit your health care provider once a year for an annual wellness visit. Ask your health care provider how often you should have your eyes and teeth checked. Stay up to date on all vaccines. This information is not intended to replace advice given to you by your health care provider. Make sure you discuss any questions you have with your health care provider. Document Revised: 03/25/2021 Document Reviewed: 03/25/2021 Elsevier Patient Education  2024 [employer].

## 2023-11-01 NOTE — Assessment & Plan Note (Signed)
Continue management per cardiology.  Blood pressure at goal today.

## 2023-11-01 NOTE — Assessment & Plan Note (Signed)
Check lipids.  He is on pravastatin 80 mg daily.  Tolerating well.

## 2023-11-01 NOTE — Assessment & Plan Note (Signed)
Stable on Protonix 20 mg daily.  Check labs.

## 2023-11-01 NOTE — Assessment & Plan Note (Signed)
Overall symptoms are stable.  He does not think that the Cialis 10 mg is quite strong enough.  We will try 20 mg.  He is aware of potential side effects.  He can follow-up with [place] in a few weeks via MyChart.  May need referral to urology if not improving with this dose.

## 2023-11-01 NOTE — Assessment & Plan Note (Signed)
Has seen hematology for this.  Will check CBC and iron panel today.  He is taking iron supplementation.  Per last note from hematology they may consider infusion if levels are not at goal with oral supplementation.

## 2023-11-01 NOTE — Progress Notes (Signed)
h  Chief Complaint:  Brent Taylor is a 83 y.o. male who presents today for his annual comprehensive physical exam.    Assessment/Plan:  Chronic Problems Addressed Today: Type 2 diabetes mellitus with vascular disease (HCC) Check A1c.  Last A1c 6.1.  Diet controlled.  Dyslipidemia associated with type 2 diabetes mellitus (HCC) Check lipids.  He is on pravastatin 80 mg daily.  Tolerating well.  Major depression in remission (HCC) Stable on Prozac 40 mg daily.  Erectile dysfunction Overall symptoms are stable.  He does not think that the Cialis 10 mg is quite strong enough.  We will try 20 mg.  He is aware of potential side effects.  He can follow-up with [place] in a few weeks via MyChart.  May need referral to urology if not improving with this dose.  B12 deficiency Check B12.   Iron deficiency anemia Has seen hematology for this.  Will check CBC and iron panel today.  He is taking iron supplementation.  Per last note from hematology they may consider infusion if levels are not at goal with oral supplementation.  GERD (gastroesophageal reflux disease) Stable on Protonix 20 mg daily.  Check labs.  Hypertension associated with diabetes (HCC) Blood pressure goal today on metoprolol tartrate 12.5 mg twice daily and losartan 50 mg daily.  Thoracic Aortic dilatation (HCC) Continue management per cardiology.  Blood pressure at goal today.   Preventative Healthcare: Check labs. Up to date on vaccines.  No longer needs colon cancer screening due to age.  Patient Counseling(The following topics were reviewed and/or handout was given):  -Nutrition: Stressed importance of moderation in sodium/caffeine intake, saturated fat and cholesterol, caloric balance, sufficient intake of fresh fruits, vegetables, and fiber.  -Stressed the importance of regular exercise.   -Substance Abuse: Discussed cessation/primary prevention of tobacco, alcohol, or other drug use; driving or other dangerous activities  under the influence; availability of treatment for abuse.   -Injury prevention: Discussed safety belts, safety helmets, smoke detector, smoking near bedding or upholstery.   -Sexuality: Discussed sexually transmitted diseases, partner selection, use of condoms, avoidance of unintended pregnancy and contraceptive alternatives.   -Dental health: Discussed importance of regular tooth brushing, flossing, and dental visits.  -Health maintenance and immunizations reviewed. Please refer to Health maintenance section.  Return to care in 1 year for next preventative visit.     Subjective:  HPI:  He has no acute complaints today. See Assessment / plan for status of chronic conditions.   Lifestyle Diet: Cutting down on portions.  Exercise: Walking routinely.      11/01/2023    8:19 AM  Depression screen PHQ 2/9  Decreased Interest 0  Down, Depressed, Hopeless 0  PHQ - 2 Score 0  Altered sleeping 0  Tired, decreased energy 0  Change in appetite 0  Feeling bad or failure about yourself  0  Trouble concentrating 0  Moving slowly or fidgety/restless 0  Suicidal thoughts 0  PHQ-9 Score 0  Difficult doing work/chores Not difficult at all    Health Maintenance Due  Topic Date Due   FOOT EXAM  10/31/2019   OPHTHALMOLOGY EXAM  05/21/2022   Diabetic kidney evaluation - Urine ACR  10/27/2023   HEMOGLOBIN A1C  10/27/2023   Medicare Annual Wellness (AWV)  12/23/2023     ROS: Per HPI, otherwise a complete review of systems was negative.   PMH:  The following were reviewed and entered/updated in epic: Past Medical History:  Diagnosis Date   Atrial fibrillation (HCC)  Cancer (HCC)    hx - melanoma   Chicken pox    Colon polyps    Coronary artery disease    Depression    No need for therapy.  Improved   Diabetes (HCC)    Diverticula, colon    Duodenal nodule    Dyspnea    with exertion   GERD (gastroesophageal reflux disease)    Hemorrhoids    Hyperlipidemia    Hypertension     Sepsis (HCC)    Sepsis (HCC)    Sleep apnea    Tinnitus    UTI (lower urinary tract infection)    Patient Active Problem List   Diagnosis Date Noted   Iron deficiency anemia 08/01/2023   Esophageal obstruction due to food impaction 02/05/2023   Peripheral neuropathy 12/22/2022   Spinal stenosis 11/08/2022   Gait abnormality 09/16/2022   Degenerative disc disease, cervical 09/16/2022   B12 deficiency 04/22/2022   Left shoulder pain 01/26/2022   Erectile dysfunction 10/21/2021   Thoracic Aortic dilatation (HCC) 02/13/2021   OSA (obstructive sleep apnea) 12/09/2020   Sensorineural hearing loss (SNHL) of left ear with restricted hearing of right ear 08/19/2020   Subjective tinnitus, bilateral 08/19/2020   Atrial fibrillation (HCC) 06/10/2020   Hypercoagulable state due to persistent atrial fibrillation (HCC) 03/31/2020   Spinal stenosis of lumbar region 08/29/2019   Type 2 diabetes mellitus with vascular disease (HCC) 10/20/2016   Hypertension associated with diabetes (HCC) 11/20/2015   Coronary artery disease 12/10/2011   Dyslipidemia associated with type 2 diabetes mellitus (HCC) 12/10/2011   Major depression in remission (HCC) 12/10/2011   GERD (gastroesophageal reflux disease) 12/10/2011   Hx of colonic polyps 12/10/2011   History of melanoma 12/10/2011   Past Surgical History:  Procedure Laterality Date   CARDIAC CATHETERIZATION     ESOPHAGOGASTRODUODENOSCOPY N/A 02/05/2023   Procedure: ESOPHAGOGASTRODUODENOSCOPY (EGD);  Surgeon: Tressia Danas, MD;  Location: [facility] ENDOSCOPY;  Service: Gastroenterology;  Laterality: N/A;   FOREIGN BODY REMOVAL  02/05/2023   Procedure: FOREIGN BODY REMOVAL;  Surgeon: Tressia Danas, MD;  Location: [facility] ENDOSCOPY;  Service: Gastroenterology;;   LUMBAR LAMINECTOMY/DECOMPRESSION MICRODISCECTOMY N/A 11/08/2022   Procedure: LUMBAR FOUR FIVE DECOMPRESSION AND IN SITU FUSION;  Surgeon: Venita Lick, MD;  Location: [facility] OR;  Service: Orthopedics;   Laterality: N/A;    MELANOMA EXCISION     LEFT CHEST   MELANOMA EXCISION      Family History  Problem Relation Age of Onset   Heart Problems Mother        Skipping   Colon cancer Neg Hx    Esophageal cancer Neg Hx    Rectal cancer Neg Hx    Prostate cancer Neg Hx     Medications- reviewed and updated Current Outpatient Medications  Medication Sig Dispense Refill   acetaminophen (TYLENOL) 650 MG CR tablet Take 650-1,300 mg by mouth daily as needed for pain.     Ascorbic Acid (VITAMIN C PO) Take by mouth.     Blood Glucose Monitoring Suppl (ONE TOUCH ULTRA 2) w/Device KIT Use daily to check blood sugar. 1 kit 0   Cyanocobalamin (B-12 PO) Take 1 tablet by mouth daily.     ELIQUIS 5 MG TABS tablet TAKE 1 TABLET BY MOUTH TWICE A DAY 60 tablet 5   ferrous sulfate 325 (65 FE) MG EC tablet Take 325 mg by mouth every other day.     FLUoxetine (PROZAC) 40 MG capsule TAKE 1 CAPSULE BY MOUTH DAILY 90 capsule 3  fluticasone (FLONASE) 50 MCG/ACT nasal spray Place 1 spray into both nostrils daily as needed for allergies or rhinitis.     furosemide (LASIX) 20 MG tablet Take 20 mg by mouth daily.     Lancets (ONETOUCH ULTRASOFT) lancets USE TO TEST BLOOD SUGAR DAILY 100 each 12   losartan (COZAAR) 50 MG tablet TAKE 1 TABLET BY MOUTH DAILY 90 tablet 0   MAGNESIUM PO Take 1 tablet by mouth daily.     metoprolol tartrate (LOPRESSOR) 25 MG tablet TAKE ONE HALF TABLET (12.5 MG) BY MOUTH TWICE A DAY 90 tablet 1   ondansetron (ZOFRAN-ODT) 4 MG disintegrating tablet Take 1 tablet (4 mg total) by mouth every 8 (eight) hours as needed for nausea or vomiting. 12 tablet 0   ONETOUCH ULTRA TEST test strip USE TO TEST BLOOD SUGAR AS DIRECTED 100 strip 3   pantoprazole (PROTONIX) 40 MG tablet Take 1 tablet (40 mg total) by mouth daily. 90 tablet 3   pravastatin (PRAVACHOL) 80 MG tablet TAKE ONE TABLET BY MOUTH EVERY EVENING 90 tablet 2   tadalafil (CIALIS) 20 MG tablet Take 1 tablet (20 mg total) by  mouth every other day as needed for erectile dysfunction. 30 tablet 5   traMADol (ULTRAM) 50 MG tablet Take 50 mg by mouth daily as needed for moderate pain or severe pain.     No current facility-administered medications for this visit.    Allergies-reviewed and updated Allergies  Allergen Reactions   Gabapentin Other (See Comments)    Lethargic and depressed, aching   Lipitor [Atorvastatin Calcium]     Muscle ache    Social History   Socioeconomic History   Marital status: Married    Spouse name: Not on file   Number of children: 2   Years of education: Not on file   Highest education level: Master's degree (e.g., MA, MS, MEng, MEd, MSW, MBA)  Occupational History   Occupation: Optician, dispensing   Occupation: retired  Tobacco Use   Smoking status: Former    Types: Cigarettes   Smokeless tobacco: Never   Tobacco comments:    Former smoker 06/17/23  Vaping Use   Vaping status: Never Used  Substance and Sexual Activity   Alcohol use: No   Drug use: No   Sexual activity: Not on file  Other Topics Concern   Not on file  Social History Narrative   Lives with wife in [place].   Retired [employer]   Attends [employer]     Social Drivers of Health   Financial Resource Strain: Low Risk  (12/23/2022)   Overall Financial Resource Strain (CARDIA)    Difficulty of Paying Living Expenses: Not hard at all  Food Insecurity: No Food Insecurity (01/14/2023)   Hunger Vital Sign    Worried About Running Out of Food in the Last Year: Never true    Ran Out of Food in the Last Year: Never true  Transportation Needs: No Transportation Needs (01/14/2023)   PRAPARE - Administrator, Civil Service (Medical): No    Lack of Transportation (Non-Medical): No  Physical Activity: Sufficiently Active (01/14/2023)   Exercise Vital Sign    Days of Exercise per Week: 7 days    Minutes of Exercise per Session: 60 min  Recent Concern: Physical Activity - Insufficiently Active  (12/23/2022)   Exercise Vital Sign    Days of Exercise per Week: 5 days    Minutes of Exercise per Session: 20 min  Stress: No  Stress Concern Present (01/14/2023)   [employer] of Occupational Health - Occupational Stress Questionnaire    Feeling of Stress : Only a little  Social Connections: Socially Integrated (01/14/2023)   Social Connection and Isolation Panel [NHANES]    Frequency of Communication with Friends and Family: More than three times a week    Frequency of Social Gatherings with Friends and Family: Twice a week    Attends Religious Services: More than 4 times per year    Active Member of [employer] or Organizations: Yes    Attends Engineer, structural: More than 4 times per year    Marital Status: Married        Objective:  Physical Exam: BP 125/79   Pulse (!) 52   Temp (!) 97.2 F (36.2 C) (Temporal)   Ht 6' (1.829 m)   Wt 233 lb 9.6 oz (106 kg)   SpO2 97%   BMI 31.68 kg/m   Body mass index is 31.68 kg/m. Wt Readings from Last 3 Encounters:  11/01/23 233 lb 9.6 oz (106 kg)  08/26/23 231 lb 6.4 oz (105 kg)  08/05/23 236 lb (107 kg)   Gen: NAD, resting comfortably HEENT: TMs normal bilaterally. OP clear. No thyromegaly noted.  CV: Irregular with no murmurs appreciated Pulm: NWOB, CTAB with no crackles, wheezes, or rhonchi GI: Normal bowel sounds present. Soft, Nontender, Nondistended. MSK: no edema, cyanosis, or clubbing noted Skin: warm, dry Neuro: CN2-12 grossly intact. Strength 5/5 in upper and lower extremities. Reflexes symmetric and intact bilaterally.  Psych: Normal affect and thought content     Mathieu Schloemer M. Jimmey Ralph, MD 11/01/2023 9:04 AM

## 2023-11-01 NOTE — Assessment & Plan Note (Signed)
Stable on Prozac 40 mg daily. 

## 2023-11-01 NOTE — Assessment & Plan Note (Signed)
Blood pressure goal today on metoprolol tartrate 12.5 mg twice daily and losartan 50 mg daily.

## 2023-11-01 NOTE — Assessment & Plan Note (Signed)
Check A1c.  Last A1c 6.1.  Diet controlled.

## 2023-11-01 NOTE — Assessment & Plan Note (Signed)
Check B12 

## 2023-11-02 NOTE — Telephone Encounter (Signed)
Spoke with patient, stated was not sure about lab results  Patient notified PCP did not sign labs yet, once he sign them I will give him a call back  Patient verbalized understanding

## 2023-11-03 ENCOUNTER — Encounter: Payer: Self-pay | Admitting: Family Medicine

## 2023-11-03 ENCOUNTER — Telehealth: Payer: Self-pay

## 2023-11-03 NOTE — Telephone Encounter (Signed)
RDW means he is still making blood

## 2023-11-03 NOTE — Progress Notes (Signed)
Vitamin D is a bit low.  Recommend starting 5000 IUs daily.  We should recheck in 3 to 6 months.  Is iron is low but improving.  He should continue with iron supplementation and we can recheck in 3 to 6 months.    His A1c and cholesterol are borderline elevated but stable compared to previous values.  We can recheck these in 6 months.  He should continue to work on diet and exercise.  Do not need to make any changes to his treatment plan.  The rest of the labs are all at goal and we can recheck in a year or so.

## 2023-11-03 NOTE — Telephone Encounter (Signed)
Called and reviewed RDW result and told him Dr. Bertis Ruddy said that it means he is making blood. He verbalized understanding and appreciated the call.

## 2023-11-03 NOTE — Telephone Encounter (Signed)
Called patient to let him know that his CBC with PCP went all the way to normal with just oral supplement!  Dr. Bertis Ruddy thinks this is amazing. Let him know that he should continue to take oral iron daily and that he can reduce his B12 to once a week. I also let him know that he does not need to return to see Dr. Bertis Ruddy and can just follow up with his PCP. He has an appt set for July 2025 with his PCP.  He did ask about the RDW range being high since it was high the last three times. I told him that Steward Drone would call him back to discuss that result with him.  Forwarding call to [facility] and Dr. Bertis Ruddy for a call back. Lorayne Marek, RN

## 2023-11-04 NOTE — Telephone Encounter (Signed)
The important value is the ratio. His ratio is at goal.   The amount of microalbumin and creatinine in the urine can vary based on diet, exercise, time of day, hydration status etc. The ratio is what corrects for these fluctuations.  If he has additional questions, recommend he come in for office visit for face to face discussion.  Katina Degree. Jimmey Ralph, MD 11/04/2023 10:00 AM

## 2023-11-04 NOTE — Telephone Encounter (Signed)
Please advise

## 2023-11-09 ENCOUNTER — Ambulatory Visit (HOSPITAL_COMMUNITY)
Admission: RE | Admit: 2023-11-09 | Discharge: 2023-11-09 | Disposition: A | Discharge status: Home / Self Care | Payer: PPO | Source: Ambulatory Visit | Attending: Physician Assistant | Admitting: Physician Assistant

## 2023-11-09 ENCOUNTER — Other Ambulatory Visit (HOSPITAL_COMMUNITY): Payer: Self-pay | Admitting: Physician Assistant

## 2023-11-09 ENCOUNTER — Encounter (HOSPITAL_COMMUNITY): Payer: Self-pay | Admitting: Physician Assistant

## 2023-11-09 VITALS — BP 138/90 | HR 92 | Ht 72.0 in | Wt 234.4 lb

## 2023-11-09 DIAGNOSIS — I4819 Other persistent atrial fibrillation: Secondary | ICD-10-CM | POA: Diagnosis not present

## 2023-11-09 DIAGNOSIS — Z7901 Long term (current) use of anticoagulants: Secondary | ICD-10-CM | POA: Diagnosis not present

## 2023-11-09 DIAGNOSIS — Z79899 Other long term (current) drug therapy: Secondary | ICD-10-CM | POA: Diagnosis not present

## 2023-11-09 DIAGNOSIS — E785 Hyperlipidemia, unspecified: Secondary | ICD-10-CM | POA: Insufficient documentation

## 2023-11-09 DIAGNOSIS — G4733 Obstructive sleep apnea (adult) (pediatric): Secondary | ICD-10-CM | POA: Diagnosis not present

## 2023-11-09 DIAGNOSIS — D6869 Other thrombophilia: Secondary | ICD-10-CM | POA: Insufficient documentation

## 2023-11-09 DIAGNOSIS — E119 Type 2 diabetes mellitus without complications: Secondary | ICD-10-CM | POA: Insufficient documentation

## 2023-11-09 DIAGNOSIS — I2510 Atherosclerotic heart disease of native coronary artery without angina pectoris: Secondary | ICD-10-CM | POA: Insufficient documentation

## 2023-11-09 DIAGNOSIS — I10 Essential (primary) hypertension: Secondary | ICD-10-CM | POA: Diagnosis not present

## 2023-11-09 NOTE — H&P (View-Only) (Signed)
 Primary Care Physician: [name], MD Primary Cardiologist: [name], MD Electrophysiologist: None  Sleep Medicine: Dr Mayford Knife Referring Physician: Dr [name] is a 83 y.o. male with a history of CAD, HLD, OSA, DM, atrial fibrillation who presents for follow up in the [facility]. Patient is on Eliquis for a CHADS2VASC score of 5. The patient was seen by Dr [name] on 05/25/23 with increased DOE and lower extremity edema. He was in afib at that time. A Zio monitor was placed which showed 100% afib burden. His last ECG in SR was January of 2023.  Patient was scheduled for DCCV but this was cancelled due to acute anemia. His hgb nadir was 9.9. He was evaluated by GI and underwent a colonoscopy and endoscopy about two weeks ago which did not show any acute bleeding. His hgb normalized with with oral iron supplementation.   Patient returns for follow up for atrial fibrillation. He remains in rate controlled afib. He does report an improvement in his fatigue since starting iron. No bleeding issues on anticoagulation.   Today, he denies symptoms of palpitations, chest pain, orthopnea, PND, lower extremity edema, dizziness, presyncope, syncope, snoring, daytime somnolence, bleeding, or neurologic sequela. The patient is tolerating medications without difficulties and is otherwise without complaint today.    Atrial Fibrillation Risk Factors:  he does have symptoms or diagnosis of sleep apnea. he does not have a history of rheumatic fever.   Atrial Fibrillation Management history:  Previous antiarrhythmic drugs: none Previous cardioversions: none Previous ablations: none Anticoagulation history: Eliquis  ROS- All systems are reviewed and negative except as per the HPI above.  Past Medical History:  Diagnosis Date   Atrial fibrillation (HCC)    Cancer (HCC)    hx - melanoma   Chicken pox    Colon polyps    Coronary  artery disease    Depression    No need for therapy.  Improved   Diabetes (HCC)    Diverticula, colon    Duodenal nodule    Dyspnea    with exertion   GERD (gastroesophageal reflux disease)    Hemorrhoids    Hyperlipidemia    Hypertension    Sepsis (HCC)    Sepsis (HCC)    Sleep apnea    Tinnitus    UTI (lower urinary tract infection)     Current Outpatient Medications  Medication Sig Dispense Refill   acetaminophen (TYLENOL) 650 MG CR tablet Take 650-1,300 mg by mouth daily as needed for pain.     Ascorbic Acid (VITAMIN C PO) Take by mouth.     Blood Glucose Monitoring Suppl (ONE TOUCH ULTRA 2) w/Device KIT Use daily to check blood sugar. 1 kit 0   Cyanocobalamin (B-12 PO) Take 1 tablet by mouth daily.     ELIQUIS 5 MG TABS tablet TAKE 1 TABLET BY MOUTH TWICE A DAY 60 tablet 5   ferrous sulfate 325 (65 FE) MG EC tablet Take 325 mg by mouth every other day.     FLUoxetine (PROZAC) 40 MG capsule TAKE 1 CAPSULE BY MOUTH DAILY 90 capsule 3   fluticasone (FLONASE) 50 MCG/ACT nasal spray Place 1 spray into both nostrils daily as needed for allergies or rhinitis.     furosemide (LASIX) 20 MG tablet Take 20 mg by mouth daily.     Lancets (ONETOUCH ULTRASOFT) lancets USE TO TEST BLOOD SUGAR DAILY 100 each 12   losartan (COZAAR) 50 MG tablet  TAKE 1 TABLET BY MOUTH DAILY 90 tablet 0   MAGNESIUM PO Take 1 tablet by mouth daily.     metoprolol tartrate (LOPRESSOR) 25 MG tablet TAKE ONE HALF TABLET (12.5 MG) BY MOUTH TWICE A DAY 90 tablet 1   ondansetron (ZOFRAN-ODT) 4 MG disintegrating tablet Take 1 tablet (4 mg total) by mouth every 8 (eight) hours as needed for nausea or vomiting. 12 tablet 0   ONETOUCH ULTRA TEST test strip USE TO TEST BLOOD SUGAR AS DIRECTED 100 strip 3   pantoprazole (PROTONIX) 40 MG tablet Take 1 tablet (40 mg total) by mouth daily. 90 tablet 3   pravastatin (PRAVACHOL) 80 MG tablet TAKE ONE TABLET BY MOUTH EVERY EVENING 90 tablet 2   tadalafil (CIALIS) 20 MG tablet  Take 1 tablet (20 mg total) by mouth every other day as needed for erectile dysfunction. 30 tablet 5   traMADol (ULTRAM) 50 MG tablet Take 50 mg by mouth daily as needed for moderate pain or severe pain.     No current facility-administered medications for this encounter.    Physical Exam: BP (!) 138/90   Pulse 92   Ht 6' (1.829 m)   Wt 106.3 kg   BMI 31.79 kg/m   GEN: Well nourished, well developed in no acute distress CARDIAC: Irregularly irregular rate and rhythm, no murmurs, rubs, gallops RESPIRATORY:  Clear to auscultation without rales, wheezing or rhonchi  ABDOMEN: Soft, non-tender, non-distended EXTREMITIES:  No edema; No deformity    Wt Readings from Last 3 Encounters:  11/09/23 106.3 kg  11/01/23 106 kg  08/26/23 105 kg     EKG today demonstrates  Afib, LAFB Vent. rate 92 BPM PR interval * ms QRS duration 110 ms QT/QTcB 362/447 ms   Echo 12/23/22 demonstrated   1. Left ventricular ejection fraction, by estimation, is 50 to 55%. The  left ventricle has low normal function. The left ventricle has no regional  wall motion abnormalities. There is mild concentric left ventricular  hypertrophy. Left ventricular diastolic parameters are indeterminate.   2. Right ventricular systolic function is normal. The right ventricular  size is normal. There is normal pulmonary artery systolic pressure.   3. Left atrial size was severely dilated.   4. Right atrial size was severely dilated.   5. The mitral valve is normal in structure. Mild mitral valve  regurgitation. No evidence of mitral stenosis.   6. The aortic valve is tricuspid. Aortic valve regurgitation is not  visualized. No aortic stenosis is present.   7. Aortic dilatation noted. There is moderate dilatation of the ascending  aorta, measuring 46 mm.   8. The inferior vena cava is normal in size with greater than 50%  respiratory variability, suggesting right atrial pressure of 3 mmHg.    CHA2DS2-VASc Score = 5   The patient's score is based upon: CHF History: 0 HTN History: 1 Diabetes History: 1 Stroke History: 0 Vascular Disease History: 1 Age Score: 2 Gender Score: 0       ASSESSMENT AND PLAN: Persistent Atrial Fibrillation (ICD10:  I48.19) The patient's CHA2DS2-VASc score is 5, indicating a 7.2% annual risk of stroke.   Patient remains in rate controlled afib. We discussed rhythm control options. Will plan for DCCV now that his hgb has normalized. Continue Eliquis 5 mg BID Continue Lopressor 12.5 mg BID  Secondary Hypercoagulable State (ICD10:  D68.69) The patient is at significant risk for stroke/thromboembolism based upon his CHA2DS2-VASc Score of 5.  Continue Apixaban (Eliquis).  OSA  Encouraged nightly CPAP Followed by Dr Mayford Knife  CAD No anginal symptoms Followed by Dr [name]     Follow up in the AF clinic post DCCV.   Informed Consent   Shared Decision Making/Informed Consent The risks (stroke, cardiac arrhythmias rarely resulting in the need for a temporary or permanent pacemaker, skin irritation or burns and complications associated with conscious sedation including aspiration, arrhythmia, respiratory failure and death), benefits (restoration of normal sinus rhythm) and alternatives of a direct current cardioversion were explained in detail to Mr. Barto and he agrees to proceed.          Jorja Loa PA-C Afib Clinic [facility] [address] [place], [place] [address] [phone]

## 2023-11-09 NOTE — Patient Instructions (Addendum)
Cardioversion scheduled for: January 31st 2025   - Arrive at the [facility] and go to admitting at 7:45 am   - Do not eat or drink anything after midnight the night prior to your procedure.   - Take all your morning medication (except diabetic medications) with a sip of water prior to arrival.  - You will not be able to drive home after your procedure.    - Do NOT miss any doses of your blood thinner - if you should miss a dose please notify our office immediately.   - If you feel as if you go back into normal rhythm prior to scheduled cardioversion, please notify our office immediately.   If your procedure is canceled in the cardioversion suite you will be charged a cancellation fee.         For those patients who have a scheduled procedure/anesthesia on the same day of the week as their dose, hold the medication on the day of surgery.  They can take their scheduled dose the week before.  **Patients on the above medications scheduled for elective procedures that have not held the medication for the appropriate amount of time are at risk of cancellation or change in the anesthetic plan.

## 2023-11-09 NOTE — Progress Notes (Signed)
Primary Care Physician: [name], MD Primary Cardiologist: [name], MD Electrophysiologist: None  Sleep Medicine: Dr Mayford Knife Referring Physician: Dr Brent Taylor is a 83 y.o. male with a history of CAD, HLD, OSA, DM, atrial fibrillation who presents for follow up in the [facility]. Patient is on Eliquis for a CHADS2VASC score of 5. The patient was seen by Dr [name] on 05/25/23 with increased DOE and lower extremity edema. He was in afib at that time. A Zio monitor was placed which showed 100% afib burden. His last ECG in SR was January of 2023.  Patient was scheduled for DCCV but this was cancelled due to acute anemia. His hgb nadir was 9.9. He was evaluated by GI and underwent a colonoscopy and endoscopy about two weeks ago which did not show any acute bleeding. His hgb normalized with with oral iron supplementation.   Patient returns for follow up for atrial fibrillation. He remains in rate controlled afib. He does report an improvement in his fatigue since starting iron. No bleeding issues on anticoagulation.   Today, he denies symptoms of palpitations, chest pain, orthopnea, PND, lower extremity edema, dizziness, presyncope, syncope, snoring, daytime somnolence, bleeding, or neurologic sequela. The patient is tolerating medications without difficulties and is otherwise without complaint today.    Atrial Fibrillation Risk Factors:  he does have symptoms or diagnosis of sleep apnea. he does not have a history of rheumatic fever.   Atrial Fibrillation Management history:  Previous antiarrhythmic drugs: none Previous cardioversions: none Previous ablations: none Anticoagulation history: Eliquis  ROS- All systems are reviewed and negative except as per the HPI above.  Past Medical History:  Diagnosis Date   Atrial fibrillation (HCC)    Cancer (HCC)    hx - melanoma   Chicken pox    Colon polyps    Coronary  artery disease    Depression    No need for therapy.  Improved   Diabetes (HCC)    Diverticula, colon    Duodenal nodule    Dyspnea    with exertion   GERD (gastroesophageal reflux disease)    Hemorrhoids    Hyperlipidemia    Hypertension    Sepsis (HCC)    Sepsis (HCC)    Sleep apnea    Tinnitus    UTI (lower urinary tract infection)     Current Outpatient Medications  Medication Sig Dispense Refill   acetaminophen (TYLENOL) 650 MG CR tablet Take 650-1,300 mg by mouth daily as needed for pain.     Ascorbic Acid (VITAMIN C PO) Take by mouth.     Blood Glucose Monitoring Suppl (ONE TOUCH ULTRA 2) w/Device KIT Use daily to check blood sugar. 1 kit 0   Cyanocobalamin (B-12 PO) Take 1 tablet by mouth daily.     ELIQUIS 5 MG TABS tablet TAKE 1 TABLET BY MOUTH TWICE A DAY 60 tablet 5   ferrous sulfate 325 (65 FE) MG EC tablet Take 325 mg by mouth every other day.     FLUoxetine (PROZAC) 40 MG capsule TAKE 1 CAPSULE BY MOUTH DAILY 90 capsule 3   fluticasone (FLONASE) 50 MCG/ACT nasal spray Place 1 spray into both nostrils daily as needed for allergies or rhinitis.     furosemide (LASIX) 20 MG tablet Take 20 mg by mouth daily.     Lancets (ONETOUCH ULTRASOFT) lancets USE TO TEST BLOOD SUGAR DAILY 100 each 12   losartan (COZAAR) 50 MG tablet  TAKE 1 TABLET BY MOUTH DAILY 90 tablet 0   MAGNESIUM PO Take 1 tablet by mouth daily.     metoprolol tartrate (LOPRESSOR) 25 MG tablet TAKE ONE HALF TABLET (12.5 MG) BY MOUTH TWICE A DAY 90 tablet 1   ondansetron (ZOFRAN-ODT) 4 MG disintegrating tablet Take 1 tablet (4 mg total) by mouth every 8 (eight) hours as needed for nausea or vomiting. 12 tablet 0   ONETOUCH ULTRA TEST test strip USE TO TEST BLOOD SUGAR AS DIRECTED 100 strip 3   pantoprazole (PROTONIX) 40 MG tablet Take 1 tablet (40 mg total) by mouth daily. 90 tablet 3   pravastatin (PRAVACHOL) 80 MG tablet TAKE ONE TABLET BY MOUTH EVERY EVENING 90 tablet 2   tadalafil (CIALIS) 20 MG tablet  Take 1 tablet (20 mg total) by mouth every other day as needed for erectile dysfunction. 30 tablet 5   traMADol (ULTRAM) 50 MG tablet Take 50 mg by mouth daily as needed for moderate pain or severe pain.     No current facility-administered medications for this encounter.    Physical Exam: BP (!) 138/90   Pulse 92   Ht 6' (1.829 m)   Wt 106.3 kg   BMI 31.79 kg/m   GEN: Well nourished, well developed in no acute distress CARDIAC: Irregularly irregular rate and rhythm, no murmurs, rubs, gallops RESPIRATORY:  Clear to auscultation without rales, wheezing or rhonchi  ABDOMEN: Soft, non-tender, non-distended EXTREMITIES:  No edema; No deformity    Wt Readings from Last 3 Encounters:  11/09/23 106.3 kg  11/01/23 106 kg  08/26/23 105 kg     EKG today demonstrates  Afib, LAFB Vent. rate 92 BPM PR interval * ms QRS duration 110 ms QT/QTcB 362/447 ms   Echo 12/23/22 demonstrated   1. Left ventricular ejection fraction, by estimation, is 50 to 55%. The  left ventricle has low normal function. The left ventricle has no regional  wall motion abnormalities. There is mild concentric left ventricular  hypertrophy. Left ventricular diastolic parameters are indeterminate.   2. Right ventricular systolic function is normal. The right ventricular  size is normal. There is normal pulmonary artery systolic pressure.   3. Left atrial size was severely dilated.   4. Right atrial size was severely dilated.   5. The mitral valve is normal in structure. Mild mitral valve  regurgitation. No evidence of mitral stenosis.   6. The aortic valve is tricuspid. Aortic valve regurgitation is not  visualized. No aortic stenosis is present.   7. Aortic dilatation noted. There is moderate dilatation of the ascending  aorta, measuring 46 mm.   8. The inferior vena cava is normal in size with greater than 50%  respiratory variability, suggesting right atrial pressure of 3 mmHg.    CHA2DS2-VASc Score = 5   The patient's score is based upon: CHF History: 0 HTN History: 1 Diabetes History: 1 Stroke History: 0 Vascular Disease History: 1 Age Score: 2 Gender Score: 0       ASSESSMENT AND PLAN: Persistent Atrial Fibrillation (ICD10:  I48.19) The patient's CHA2DS2-VASc score is 5, indicating a 7.2% annual risk of stroke.   Patient remains in rate controlled afib. We discussed rhythm control options. Will plan for DCCV now that his hgb has normalized. Continue Eliquis 5 mg BID Continue Lopressor 12.5 mg BID  Secondary Hypercoagulable State (ICD10:  D68.69) The patient is at significant risk for stroke/thromboembolism based upon his CHA2DS2-VASc Score of 5.  Continue Apixaban (Eliquis).  OSA  Encouraged nightly CPAP Followed by Dr Mayford Knife  CAD No anginal symptoms Followed by Dr [name]     Follow up in the AF clinic post DCCV.   Informed Consent   Shared Decision Making/Informed Consent The risks (stroke, cardiac arrhythmias rarely resulting in the need for a temporary or permanent pacemaker, skin irritation or burns and complications associated with conscious sedation including aspiration, arrhythmia, respiratory failure and death), benefits (restoration of normal sinus rhythm) and alternatives of a direct current cardioversion were explained in detail to Brent Taylor and he agrees to proceed.          Jorja Loa PA-C Afib Clinic [facility] [address] [place], [place] [address] [phone]

## 2023-11-09 NOTE — Addendum Note (Signed)
Encounter addended by: Learta Codding, CMA on: 11/09/2023 2:55 PM  Actions taken: Clinical Note Signed

## 2023-11-10 ENCOUNTER — Other Ambulatory Visit: Payer: Self-pay | Admitting: Internal Medicine

## 2023-11-10 DIAGNOSIS — I480 Paroxysmal atrial fibrillation: Secondary | ICD-10-CM

## 2023-11-10 DIAGNOSIS — G4733 Obstructive sleep apnea (adult) (pediatric): Secondary | ICD-10-CM | POA: Diagnosis not present

## 2023-11-10 NOTE — Telephone Encounter (Signed)
Eliquis 5mg  refill request received. Patient is 83 years old, weight-106.3kg, Crea-1.10 on 11/01/23, Diagnosis-Afib, and last seen by Alphonzo Severance on 11/09/23. Dose is appropriate based on dosing criteria. Will send in refill to requested pharmacy.

## 2023-11-10 NOTE — OR Nursing (Signed)
Called patient with pre-procedure instructions for tomorrow.   Patient informed of:   Time to arrive for procedure. 0730 Remain NPO past midnight.  Must have a ride home and a responsible adult to remain with them for 24 hours post procedure.  Confirmed blood thinner. Confirmed no breaks in taking blood thinner for 3+ weeks prior to procedure. Confirmed patient stopped all GLP-1s and GLP-2s for at least one week before procedure.

## 2023-11-10 NOTE — Anesthesia Preprocedure Evaluation (Addendum)
Anesthesia Evaluation  Patient identified by MRN, date of birth, ID band Patient awake    Reviewed: Allergy & Precautions, H&P , NPO status , Patient's Chart, lab work & pertinent test results  Airway Mallampati: II  TM Distance: >3 FB Neck ROM: Full    Dental no notable dental hx. (+) Teeth Intact   Pulmonary sleep apnea , former smoker   Pulmonary exam normal breath sounds clear to auscultation       Cardiovascular Exercise Tolerance: Good hypertension, Pt. on medications and Pt. on home beta blockers + CAD  + dysrhythmias Atrial Fibrillation  Rhythm:Irregular Rate:Normal     Neuro/Psych    Depression    negative neurological ROS     GI/Hepatic Neg liver ROS,GERD  Medicated,,  Endo/Other  diabetes    Renal/GU negative Renal ROS  negative genitourinary   Musculoskeletal  (+) Arthritis , Osteoarthritis,    Abdominal   Peds  Hematology  (+) Blood dyscrasia, anemia   Anesthesia Other Findings   Reproductive/Obstetrics negative OB ROS                             Anesthesia Physical Anesthesia Plan  ASA: 3  Anesthesia Plan: General   Post-op Pain Management: Minimal or no pain anticipated   Induction: Intravenous  PONV Risk Score and Plan: 2 and Propofol infusion and Treatment may vary due to age or medical condition  Airway Management Planned: Mask and Natural Airway  Additional Equipment:   Intra-op Plan:   Post-operative Plan:   Informed Consent: I have reviewed the patients History and Physical, chart, labs and discussed the procedure including the risks, benefits and alternatives for the proposed anesthesia with the patient or authorized representative who has indicated his/her understanding and acceptance.     Dental advisory given  Plan Discussed with: CRNA  Anesthesia Plan Comments:        Anesthesia Quick Evaluation

## 2023-11-11 ENCOUNTER — Ambulatory Visit (HOSPITAL_COMMUNITY)
Admission: RE | Admit: 2023-11-11 | Discharge: 2023-11-11 | Disposition: A | Discharge status: Home / Self Care | Payer: PPO | Attending: Cardiology | Admitting: Cardiology

## 2023-11-11 ENCOUNTER — Encounter (HOSPITAL_COMMUNITY)
Admission: RE | Disposition: A | Discharge status: Home / Self Care | Payer: Self-pay | Source: Home / Self Care | Attending: Cardiology

## 2023-11-11 ENCOUNTER — Ambulatory Visit (HOSPITAL_COMMUNITY): Payer: PPO | Admitting: Anesthesiology

## 2023-11-11 ENCOUNTER — Ambulatory Visit (HOSPITAL_BASED_OUTPATIENT_CLINIC_OR_DEPARTMENT_OTHER): Payer: PPO | Admitting: Anesthesiology

## 2023-11-11 ENCOUNTER — Other Ambulatory Visit: Payer: Self-pay

## 2023-11-11 DIAGNOSIS — D6869 Other thrombophilia: Secondary | ICD-10-CM | POA: Insufficient documentation

## 2023-11-11 DIAGNOSIS — E119 Type 2 diabetes mellitus without complications: Secondary | ICD-10-CM | POA: Insufficient documentation

## 2023-11-11 DIAGNOSIS — E785 Hyperlipidemia, unspecified: Secondary | ICD-10-CM | POA: Diagnosis not present

## 2023-11-11 DIAGNOSIS — I2510 Atherosclerotic heart disease of native coronary artery without angina pectoris: Secondary | ICD-10-CM | POA: Diagnosis not present

## 2023-11-11 DIAGNOSIS — G4733 Obstructive sleep apnea (adult) (pediatric): Secondary | ICD-10-CM | POA: Insufficient documentation

## 2023-11-11 DIAGNOSIS — I10 Essential (primary) hypertension: Secondary | ICD-10-CM | POA: Diagnosis not present

## 2023-11-11 DIAGNOSIS — I4819 Other persistent atrial fibrillation: Secondary | ICD-10-CM | POA: Diagnosis not present

## 2023-11-11 DIAGNOSIS — Z79899 Other long term (current) drug therapy: Secondary | ICD-10-CM | POA: Insufficient documentation

## 2023-11-11 DIAGNOSIS — K219 Gastro-esophageal reflux disease without esophagitis: Secondary | ICD-10-CM | POA: Insufficient documentation

## 2023-11-11 DIAGNOSIS — Z7901 Long term (current) use of anticoagulants: Secondary | ICD-10-CM | POA: Diagnosis not present

## 2023-11-11 DIAGNOSIS — I4891 Unspecified atrial fibrillation: Secondary | ICD-10-CM

## 2023-11-11 HISTORY — PX: CARDIOVERSION: EP1203

## 2023-11-11 SURGERY — CARDIOVERSION (CATH LAB)
Anesthesia: General

## 2023-11-11 MED ORDER — SODIUM CHLORIDE 0.9 % IV SOLN: INTRAVENOUS | Status: DC | PRN

## 2023-11-11 MED ORDER — LIDOCAINE 2% (20 MG/ML) 5 ML SYRINGE
INTRAMUSCULAR | Status: DC | PRN
  Administered 2023-11-11: 60 mg via INTRAVENOUS

## 2023-11-11 MED ORDER — SODIUM CHLORIDE 0.9% FLUSH: 3.0000 mL | Freq: Two times a day (BID) | INTRAVENOUS | Status: DC

## 2023-11-11 MED ORDER — PROPOFOL 10 MG/ML IV BOLUS
INTRAVENOUS | Status: DC | PRN
  Administered 2023-11-11: 50 mg via INTRAVENOUS

## 2023-11-11 MED ORDER — SODIUM CHLORIDE 0.9% FLUSH: 3.0000 mL | INTRAVENOUS | Status: DC | PRN

## 2023-11-11 SURGICAL SUPPLY — 1 items: PAD DEFIB RADIO PHYSIO CONN (PAD) ×1 IMPLANT

## 2023-11-11 NOTE — Anesthesia Postprocedure Evaluation (Signed)
Anesthesia Post Note  Patient: Brent Taylor  Procedure(s) Performed: CARDIOVERSION     Patient location during evaluation: Cath Lab Anesthesia Type: General Level of consciousness: awake and alert Pain management: pain level controlled Vital Signs Assessment: post-procedure vital signs reviewed and stable Respiratory status: spontaneous breathing, nonlabored ventilation and respiratory function stable Cardiovascular status: blood pressure returned to baseline and stable Postop Assessment: no apparent nausea or vomiting Anesthetic complications: no  No notable events documented.  Last Vitals:  Vitals:   11/11/23 0855 11/11/23 0900  BP: 109/77 121/77  Pulse: 71 68  Resp: 14 13  Temp:    SpO2: 96% 97%    Last Pain:  Vitals:   11/11/23 0900  TempSrc:   PainSc: 0-No pain                 Tishawna Larouche,W. EDMOND

## 2023-11-11 NOTE — CV Procedure (Signed)
    Electrical Cardioversion Procedure Note Brent Taylor [id number] 10-Dec-1940  Procedure: Electrical Cardioversion Indications:  Atrial Fibrillation  Time Out: Verified patient identification, verified procedure,medications/allergies/relevent history reviewed, required imaging and test results available.  Performed  Procedure Details  During this procedure the patient is administered a total of Propofol 50 mcg and Lidocaine 60mg  to achieve and maintain moderate conscious sedation.  The patient's heart rate, blood pressure, and oxygen saturation are monitored continuously during the procedure. The period of conscious sedation is 2 minutes, of which I was present face-to-face 100% of this time. Brent Larsen, CRNA is an independent, trained observer who assisted in the monitoring of the patient's level of consciousness.     Cardioversion was done with synchronized biphasic defibrillation with AP pads with 200watts.  The patient converted to normal sinus bradycardia The patient tolerated the procedure well   IMPRESSION:  Successful cardioversion of atrial fibrillation    Brent Taylor 11/11/2023, 8:09 AM

## 2023-11-11 NOTE — Interval H&P Note (Signed)
History and Physical Interval Note:  11/11/2023 8:08 AM  Brent Taylor  has presented today for surgery, with the diagnosis of AFIB.  The various methods of treatment have been discussed with the patient and family. After consideration of risks, benefits and other options for treatment, the patient has consented to  Procedure(s): CARDIOVERSION (N/A) as a surgical intervention.  The patient's history has been reviewed, patient examined, no change in status, stable for surgery.  I have reviewed the patient's chart and labs.  Questions were answered to the patient's satisfaction.     [name]

## 2023-11-11 NOTE — Transfer of Care (Signed)
Immediate Anesthesia Transfer of Care Note  Patient: Brent Taylor  Procedure(s) Performed: CARDIOVERSION  Patient Location: PACU  Anesthesia Type:MAC  Level of Consciousness: sedated  Airway & Oxygen Therapy: Patient Spontanous Breathing  Post-op Assessment: Report given to RN  Post vital signs: Reviewed and stable  Last Vitals:  Vitals Value Taken Time  BP 98/69   Temp    Pulse 70   Resp 20   SpO2 96     Last Pain:  Vitals:   11/11/23 0741  TempSrc:   PainSc: 0-No pain         Complications: No notable events documented.

## 2023-11-14 ENCOUNTER — Encounter: Payer: Self-pay | Admitting: Cardiology

## 2023-11-14 ENCOUNTER — Telehealth: Payer: Self-pay

## 2023-11-14 ENCOUNTER — Encounter (HOSPITAL_COMMUNITY): Payer: Self-pay | Admitting: Cardiology

## 2023-11-14 NOTE — Telephone Encounter (Signed)
Call to patient to advise that C. Fenton PA-C reviewed his messages and advises the following:  "He very well could be back in afib after his DCCV. He is typically asymptomatic with his afib. from what he said in his message, heart rates are OK. Normal for them to go a little over 100 bpm with activity and be 80s-90s with rest. he has an appointment with me on 2/14 but if he wants to come in sooner we can reach out to him to get it moved up."  Patient denies any symptoms and says he is fine to wait until his appointment as he was just concerned about the rate and not any symptoms. He states if he notices any changes he will call our office and request to get in sooner.

## 2023-11-17 MED ORDER — METOPROLOL TARTRATE 25 MG PO TABS: 25.0000 mg | ORAL_TABLET | Freq: Two times a day (BID) | ORAL | 3 refills | Status: DC

## 2023-11-21 DIAGNOSIS — E1159 Type 2 diabetes mellitus with other circulatory complications: Secondary | ICD-10-CM | POA: Diagnosis not present

## 2023-11-25 ENCOUNTER — Ambulatory Visit (HOSPITAL_COMMUNITY)
Admission: RE | Admit: 2023-11-25 | Discharge: 2023-11-25 | Disposition: A | Discharge status: Home / Self Care | Payer: PPO | Source: Ambulatory Visit | Attending: Physician Assistant | Admitting: Physician Assistant

## 2023-11-25 VITALS — BP 142/80 | HR 65 | Ht 72.0 in | Wt 236.0 lb

## 2023-11-25 DIAGNOSIS — I484 Atypical atrial flutter: Secondary | ICD-10-CM | POA: Diagnosis not present

## 2023-11-25 DIAGNOSIS — I2510 Atherosclerotic heart disease of native coronary artery without angina pectoris: Secondary | ICD-10-CM | POA: Insufficient documentation

## 2023-11-25 DIAGNOSIS — E785 Hyperlipidemia, unspecified: Secondary | ICD-10-CM | POA: Diagnosis not present

## 2023-11-25 DIAGNOSIS — E119 Type 2 diabetes mellitus without complications: Secondary | ICD-10-CM | POA: Insufficient documentation

## 2023-11-25 DIAGNOSIS — G4733 Obstructive sleep apnea (adult) (pediatric): Secondary | ICD-10-CM | POA: Diagnosis not present

## 2023-11-25 DIAGNOSIS — Z79899 Other long term (current) drug therapy: Secondary | ICD-10-CM | POA: Diagnosis not present

## 2023-11-25 DIAGNOSIS — Z7901 Long term (current) use of anticoagulants: Secondary | ICD-10-CM | POA: Insufficient documentation

## 2023-11-25 DIAGNOSIS — D6869 Other thrombophilia: Secondary | ICD-10-CM | POA: Insufficient documentation

## 2023-11-25 DIAGNOSIS — I4819 Other persistent atrial fibrillation: Secondary | ICD-10-CM | POA: Insufficient documentation

## 2023-11-25 NOTE — Progress Notes (Signed)
Primary Care Physician: [name], MD Primary Cardiologist: [name], MD Electrophysiologist: None  Sleep Medicine: Dr Mayford Knife Referring Physician: Dr [name] is a 83 y.o. male with a history of CAD, HLD, OSA, DM, atrial fibrillation who presents for follow up in the [facility]. Patient is on Eliquis for a CHADS2VASC score of 5. The patient was seen by Dr [name] on 05/25/23 with increased DOE and lower extremity edema. He was in afib at that time. A Zio monitor was placed which showed 100% afib burden. His last ECG in SR was January of 2023.  Patient was scheduled for DCCV but this was cancelled due to acute anemia. His hgb nadir was 9.9. He was evaluated by GI and underwent a colonoscopy and endoscopy about two weeks ago which did not show any acute bleeding. His hgb normalized with with oral iron supplementation.   Patient returns for follow up for atrial fibrillation. He is s/p DCCV on 11/11/23. He sent a Mychart message on 11/14/23 reporting mildly elevated heart rates. He was asymptomatic. ECG today shows he is in atrial flutter. He did not notice any change in how he felt when in SR. No bleeding issues on anticoagulation.   Today, he denies symptoms of palpitations, chest pain, shortness of breath, orthopnea, PND, lower extremity edema, dizziness, presyncope, syncope,  bleeding, or neurologic sequela. The patient is tolerating medications without difficulties and is otherwise without complaint today.    Atrial Fibrillation Risk Factors:  he does have symptoms or diagnosis of sleep apnea. he does not have a history of rheumatic fever.   Atrial Fibrillation Management history:  Previous antiarrhythmic drugs: none Previous cardioversions: 11/11/23 Previous ablations: none Anticoagulation history: Eliquis  ROS- All systems are reviewed and negative except as per the HPI above.  Past Medical History:  Diagnosis Date    Atrial fibrillation (HCC)    Cancer (HCC)    hx - melanoma   Chicken pox    Colon polyps    Coronary artery disease    Depression    No need for therapy.  Improved   Diabetes (HCC)    Diverticula, colon    Duodenal nodule    Dyspnea    with exertion   GERD (gastroesophageal reflux disease)    Hemorrhoids    Hyperlipidemia    Hypertension    Sepsis (HCC)    Sepsis (HCC)    Sleep apnea    Tinnitus    UTI (lower urinary tract infection)     Current Outpatient Medications  Medication Sig Dispense Refill   acetaminophen (TYLENOL) 650 MG CR tablet Take 650-1,300 mg by mouth daily as needed for pain.     Blood Glucose Monitoring Suppl (ONE TOUCH ULTRA 2) w/Device KIT Use daily to check blood sugar. 1 kit 0   cyanocobalamin (VITAMIN B12) 1000 MCG tablet Take 1,000 mcg by mouth once a week.     ELIQUIS 5 MG TABS tablet TAKE 1 TABLET BY MOUTH 2 TIMES A DAY 60 tablet 5   ferrous sulfate 325 (65 FE) MG EC tablet Take 325 mg by mouth daily with breakfast.     FLUoxetine (PROZAC) 40 MG capsule TAKE 1 CAPSULE BY MOUTH DAILY 90 capsule 3   fluticasone (FLONASE) 50 MCG/ACT nasal spray Place 1 spray into both nostrils daily as needed for allergies or rhinitis.     furosemide (LASIX) 20 MG tablet Take 20 mg by mouth daily.  Lancets (ONETOUCH ULTRASOFT) lancets USE TO TEST BLOOD SUGAR DAILY 100 each 12   losartan (COZAAR) 50 MG tablet TAKE 1 TABLET BY MOUTH DAILY 90 tablet 0   metoprolol tartrate (LOPRESSOR) 25 MG tablet Take 1 tablet (25 mg total) by mouth 2 (two) times daily. 180 tablet 3   ondansetron (ZOFRAN-ODT) 4 MG disintegrating tablet Take 1 tablet (4 mg total) by mouth every 8 (eight) hours as needed for nausea or vomiting. 12 tablet 0   ONETOUCH ULTRA TEST test strip USE TO TEST BLOOD SUGAR AS DIRECTED 100 strip 3   pravastatin (PRAVACHOL) 80 MG tablet TAKE ONE TABLET BY MOUTH EVERY EVENING 90 tablet 2   tadalafil (CIALIS) 20 MG tablet Take 1 tablet (20 mg total) by mouth every  other day as needed for erectile dysfunction. 30 tablet 5   traMADol (ULTRAM) 50 MG tablet Take 50 mg by mouth daily as needed for moderate pain or severe pain.     No current facility-administered medications for this encounter.    Physical Exam: BP (!) 142/80   Pulse 65   Ht 6' (1.829 m)   Wt 107 kg   BMI 32.01 kg/m   GEN: Well nourished, well developed in no acute distress CARDIAC: Regular rate and rhythm, no murmurs, rubs, gallops RESPIRATORY:  Clear to auscultation without rales, wheezing or rhonchi  ABDOMEN: Soft, non-tender, non-distended EXTREMITIES:  No edema; No deformity    Wt Readings from Last 3 Encounters:  11/25/23 107 kg  11/09/23 106.3 kg  11/01/23 106 kg     EKG today demonstrates  Atypical atrial flutter with 4:1 block Vent. rate 65 BPM PR interval 150 ms QRS duration 110 ms QT/QTcB 436/453 ms   Echo 12/23/22 demonstrated   1. Left ventricular ejection fraction, by estimation, is 50 to 55%. The  left ventricle has low normal function. The left ventricle has no regional  wall motion abnormalities. There is mild concentric left ventricular  hypertrophy. Left ventricular diastolic parameters are indeterminate.   2. Right ventricular systolic function is normal. The right ventricular  size is normal. There is normal pulmonary artery systolic pressure.   3. Left atrial size was severely dilated.   4. Right atrial size was severely dilated.   5. The mitral valve is normal in structure. Mild mitral valve  regurgitation. No evidence of mitral stenosis.   6. The aortic valve is tricuspid. Aortic valve regurgitation is not  visualized. No aortic stenosis is present.   7. Aortic dilatation noted. There is moderate dilatation of the ascending  aorta, measuring 46 mm.   8. The inferior vena cava is normal in size with greater than 50%  respiratory variability, suggesting right atrial pressure of 3 mmHg.    CHA2DS2-VASc Score = 5  The patient's score is  based upon: CHF History: 0 HTN History: 1 Diabetes History: 1 Stroke History: 0 Vascular Disease History: 1 Age Score: 2 Gender Score: 0       ASSESSMENT AND PLAN: Persistent Atrial Fibrillation/atypical atrial flutter The patient's CHA2DS2-VASc score is 5, indicating a 7.2% annual risk of stroke.   S/p DCCV 11/11/23 with quick return of atrial flutter We discussed rhythm control options today. Would avoid class IC with h/o CAD. Would also avoid Multaq with prolonged PR at baseline. His age is marginal for ablation. Could consider amiodarone to dofetilide. He would like to pursue rate control for now and only consider AAD if he becomes symptomatic.  Continue Eliquis 5 mg BID Continue Lopressor  25 mg BID  Secondary Hypercoagulable State (ICD10:  D68.69) The patient is at significant risk for stroke/thromboembolism based upon his CHA2DS2-VASc Score of 5.  Continue Apixaban (Eliquis). No bleeding issues.  OSA  Encouraged nightly CPAP Followed by Dr Mayford Knife  CAD No anginal symptoms Followed by Dr [name]    Follow up in the AF clinic in 2 months to revisit rate vs rhythm control.      Jorja Loa PA-C Afib Clinic [facility] [address] [place], [place] [address] [phone]

## 2023-12-07 ENCOUNTER — Encounter: Payer: Self-pay | Admitting: Family Medicine

## 2023-12-08 NOTE — Telephone Encounter (Signed)
 He can try over-the-counter Imodium.  Recommend he come in for office visit if not improving.

## 2023-12-08 NOTE — Telephone Encounter (Signed)
 Please see patient msg and advise if you would prefer patient to schedule an office visit

## 2023-12-22 ENCOUNTER — Other Ambulatory Visit: Payer: Self-pay | Admitting: *Deleted

## 2023-12-22 ENCOUNTER — Encounter: Payer: Self-pay | Admitting: Family Medicine

## 2023-12-22 MED ORDER — FREESTYLE LIBRE 3 SENSOR MISC: 5 refills | Status: DC

## 2023-12-22 MED ORDER — FREESTYLE LIBRE 3 SENSOR MISC: 1 refills | Status: DC

## 2023-12-22 NOTE — Telephone Encounter (Signed)
 Rx send to pharmacy

## 2023-12-27 ENCOUNTER — Ambulatory Visit (INDEPENDENT_AMBULATORY_CARE_PROVIDER_SITE_OTHER)

## 2023-12-27 VITALS — Ht 72.0 in | Wt 236.0 lb

## 2023-12-27 DIAGNOSIS — Z0000 Encounter for general adult medical examination without abnormal findings: Secondary | ICD-10-CM | POA: Diagnosis not present

## 2023-12-27 MED ORDER — FREESTYLE LIBRE 3 SENSOR MISC: 5 refills | Status: AC

## 2023-12-27 NOTE — Addendum Note (Signed)
 Addended by: [name] on: 12/27/2023 12:44 PM   Modules accepted: Orders

## 2023-12-27 NOTE — Progress Notes (Signed)
 I have personally reviewed the Medicare Annual Wellness Visit and agree with the documentation.  Katina Degree. Jimmey Ralph, MD 12/27/2023 12:44 PM

## 2023-12-27 NOTE — Progress Notes (Signed)
 Subjective:   Brent Taylor is a 83 y.o. who presents for a Medicare Wellness preventive visit.  Visit Complete: Virtual I connected with  Brent Taylor on 12/27/23 by a audio enabled telemedicine application and verified that I am speaking with the correct person using two identifiers.  Patient Location: Home  Provider Location: Office/Clinic  I discussed the limitations of evaluation and management by telemedicine. The patient expressed understanding and agreed to proceed.  Vital Signs: Because this visit was a virtual/telehealth visit, some criteria may be missing or patient reported. Any vitals not documented were not able to be obtained and vitals that have been documented are patient reported.  VideoDeclined- This patient declined Librarian, academic. Therefore the visit was completed with audio only.  Persons Participating in Visit: Patient.  AWV Questionnaire: Yes: Patient Medicare AWV questionnaire was completed by the patient on 12/23/23; I have confirmed that all information answered by patient is correct and no changes since this date.  Cardiac Risk Factors include: advanced age (>65men, >40 women);dyslipidemia;diabetes mellitus;male gender;hypertension;obesity (BMI >30kg/m2)     Objective:    Today's Vitals   12/27/23 1043  Weight: 236 lb (107 kg)  Height: 6' (1.829 m)   Body mass index is 32.01 kg/m.     12/27/2023   10:50 AM 11/11/2023    7:42 AM 07/08/2023    4:50 PM 04/26/2023   12:52 PM 02/05/2023    1:13 AM 02/04/2023    8:08 PM 01/25/2023    6:17 PM  Advanced Directives  Does Patient Have a Medical Advance Directive? Yes Yes Yes No Yes Yes Yes  Type of Estate agent of [place];Living will Healthcare Power of [employer] of [place];Living will  Living will;Healthcare Power of Attorney    Does patient want to make changes to medical advance directive? No - Patient declined        Copy of Healthcare  Power of Attorney in Chart? Yes - validated most recent copy scanned in chart (See row information)    No - copy requested    Would patient like information on creating a medical advance directive?    No - Patient declined       Current Medications (verified) Outpatient Encounter Medications as of 12/27/2023  Medication Sig   acetaminophen (TYLENOL) 650 MG CR tablet Take 650-1,300 mg by mouth daily as needed for pain.   Bacillus Coagulans-Inulin (ALIGN PREBIOTIC-PROBIOTIC PO) Take by mouth.   Blood Glucose Monitoring Suppl (ONE TOUCH ULTRA 2) w/Device KIT Use daily to check blood sugar.   Continuous Glucose Sensor (FREESTYLE LIBRE 3 SENSOR) MISC Place 1 sensor on the skin Q14 days. Use to check glucose continuously Code E11.6   cyanocobalamin (VITAMIN B12) 1000 MCG tablet Take 1,000 mcg by mouth once a week.   ELIQUIS 5 MG TABS tablet TAKE 1 TABLET BY MOUTH 2 TIMES A DAY   ferrous sulfate 325 (65 FE) MG EC tablet Take 325 mg by mouth daily with breakfast.   FLUoxetine (PROZAC) 40 MG capsule TAKE 1 CAPSULE BY MOUTH DAILY   fluticasone (FLONASE) 50 MCG/ACT nasal spray Place 1 spray into both nostrils daily as needed for allergies or rhinitis.   furosemide (LASIX) 20 MG tablet Take 20 mg by mouth daily.   Lancets (ONETOUCH ULTRASOFT) lancets USE TO TEST BLOOD SUGAR DAILY   losartan (COZAAR) 50 MG tablet TAKE 1 TABLET BY MOUTH DAILY   metoprolol tartrate (LOPRESSOR) 25 MG tablet Take 1 tablet (25 mg total)  by mouth 2 (two) times daily.   ONETOUCH ULTRA TEST test strip USE TO TEST BLOOD SUGAR AS DIRECTED   pantoprazole (PROTONIX) 40 MG tablet Take 40 mg by mouth daily.   pravastatin (PRAVACHOL) 80 MG tablet TAKE ONE TABLET BY MOUTH EVERY EVENING   tadalafil (CIALIS) 20 MG tablet Take 1 tablet (20 mg total) by mouth every other day as needed for erectile dysfunction.   traMADol (ULTRAM) 50 MG tablet Take 50 mg by mouth daily as needed for moderate pain or severe pain.   ondansetron (ZOFRAN-ODT) 4  MG disintegrating tablet Take 1 tablet (4 mg total) by mouth every 8 (eight) hours as needed for nausea or vomiting. (Patient not taking: Reported on 12/27/2023)   No facility-administered encounter medications on file as of 12/27/2023.    Allergies (verified) Gabapentin and Lipitor [atorvastatin calcium]   History: Past Medical History:  Diagnosis Date   Atrial fibrillation (HCC)    Cancer (HCC)    hx - melanoma   Chicken pox    Colon polyps    Coronary artery disease    Depression    No need for therapy.  Improved   Diabetes (HCC)    Diverticula, colon    Duodenal nodule    Dyspnea    with exertion   GERD (gastroesophageal reflux disease)    Hemorrhoids    Hyperlipidemia    Hypertension    Sepsis (HCC)    Sepsis (HCC)    Sleep apnea    Tinnitus    UTI (lower urinary tract infection)    Past Surgical History:  Procedure Laterality Date   CARDIAC CATHETERIZATION     CARDIOVERSION N/A 11/11/2023   Procedure: CARDIOVERSION;  Surgeon: Quintella Reichert, MD;  Location: [facility] INVASIVE CV LAB;  Service: Cardiovascular;  Laterality: N/A;   ESOPHAGOGASTRODUODENOSCOPY N/A 02/05/2023   Procedure: ESOPHAGOGASTRODUODENOSCOPY (EGD);  Surgeon: Tressia Danas, MD;  Location: [facility] ENDOSCOPY;  Service: Gastroenterology;  Laterality: N/A;   FOREIGN BODY REMOVAL  02/05/2023   Procedure: FOREIGN BODY REMOVAL;  Surgeon: Tressia Danas, MD;  Location: [facility] ENDOSCOPY;  Service: Gastroenterology;;   LUMBAR LAMINECTOMY/DECOMPRESSION MICRODISCECTOMY N/A 11/08/2022   Procedure: LUMBAR FOUR FIVE DECOMPRESSION AND IN SITU FUSION;  Surgeon: Venita Lick, MD;  Location: [facility] OR;  Service: Orthopedics;  Laterality: N/A;    MELANOMA EXCISION     LEFT CHEST   MELANOMA EXCISION     Family History  Problem Relation Age of Onset   Heart Problems Mother        Skipping   Colon cancer Neg Hx    Esophageal cancer Neg Hx    Rectal cancer Neg Hx    Prostate cancer Neg Hx    Social History    Socioeconomic History   Marital status: Married    Spouse name: Not on file   Number of children: 2   Years of education: Not on file   Highest education level: Master's degree (e.g., MA, MS, MEng, MEd, MSW, MBA)  Occupational History   Occupation: Optician, dispensing   Occupation: retired  Tobacco Use   Smoking status: Former    Types: Cigarettes   Smokeless tobacco: Never   Tobacco comments:    Former smoker 06/17/23  Vaping Use   Vaping status: Never Used  Substance and Sexual Activity   Alcohol use: No   Drug use: No   Sexual activity: Not on file  Other Topics Concern   Not on file  Social History Narrative   Lives with wife in  [place].   Retired [employer]   Attends [employer]     Social Drivers of Health   Financial Resource Strain: Low Risk  (12/23/2023)   Overall Financial Resource Strain (CARDIA)    Difficulty of Paying Living Expenses: Not hard at all  Food Insecurity: No Food Insecurity (12/23/2023)   Hunger Vital Sign    Worried About Running Out of Food in the Last Year: Never true    Ran Out of Food in the Last Year: Never true  Transportation Needs: No Transportation Needs (12/23/2023)   PRAPARE - Administrator, Civil Service (Medical): No    Lack of Transportation (Non-Medical): No  Physical Activity: Insufficiently Active (12/23/2023)   Exercise Vital Sign    Days of Exercise per Week: 1 day    Minutes of Exercise per Session: 10 min  Stress: No Stress Concern Present (12/23/2023)   [employer] of Occupational Health - Occupational Stress Questionnaire    Feeling of Stress : Not at all  Social Connections: Socially Integrated (12/23/2023)   Social Connection and Isolation Panel [NHANES]    Frequency of Communication with Friends and Family: More than three times a week    Frequency of Social Gatherings with Friends and Family: Three times a week    Attends Religious Services: More than 4 times per year    Active Member  of Clubs or Organizations: Yes    Attends Banker Meetings: 1 to 4 times per year    Marital Status: Married    Tobacco Counseling Counseling given: Not Answered Tobacco comments: Former smoker 06/17/23    Clinical Intake:  Pre-visit preparation completed: Yes  Pain : No/denies pain     BMI - recorded: 32.01 Nutritional Status: BMI > 30  Obese Nutritional Risks: None Diabetes: Yes CBG done?: Yes CBG resulted in Enter/ Edit results?: No Did pt. bring in CBG monitor from home?: No  How often do you need to have someone help you when you read instructions, pamphlets, or other written materials from your doctor or pharmacy?: 1 - Never  Interpreter Needed?: No  Information entered by :: Lanier Ensign, LPN   Activities of Daily Living     12/27/2023   10:45 AM 11/11/2023    7:40 AM  In your present state of health, do you have any difficulty performing the following activities:  Hearing? 1 0  Comment hearing aids   Vision? 0 0  Difficulty concentrating or making decisions? 0 0  Walking or climbing stairs? 1   Comment use cane take time   Dressing or bathing? 0   Doing errands, shopping? 0   Preparing Food and eating ? N   Using the Toilet? N   In the past six months, have you accidently leaked urine? N   Do you have problems with loss of bowel control? N   Managing your Medications? N   Managing your Finances? N   Housekeeping or managing your Housekeeping? N     Patient Care Team: [name], MD as PCP - General (Family Medicine) [name], MD as PCP - Cardiology (Cardiology) Quintella Reichert, MD as PCP - Sleep Medicine (Cardiology) Dahlia Byes, [facility] (Pharmacist) Dahlia Byes, [facility] as Pharmacist (Pharmacist)  Indicate any recent Medical Services you may have received from other than [facility] providers in the past year (date may be approximate).     Assessment:   This is a routine wellness examination for Brent Taylor.  Hearing/Vision  screen Hearing Screening - Comments:: Pt wears hearing aids  Vision Screening - Comments:: Pt follows up with [facility] ophthalmology Dr Cathey Endow stated appt coming up soon    Goals Addressed             This Visit's Progress    Patient Stated       Lose weight        Depression Screen     12/27/2023   10:51 AM 11/01/2023    8:19 AM 06/27/2023   11:31 AM 04/26/2023    8:08 AM 02/01/2023    8:15 AM 01/17/2023    8:00 AM 12/27/2022    8:59 AM  PHQ 2/9 Scores  PHQ - 2 Score 0 0 0 0 0 0 0  PHQ- 9 Score 0 0  2 0 0 0    Fall Risk     12/27/2023   10:52 AM 11/01/2023    8:19 AM 06/27/2023   11:31 AM 04/26/2023    8:08 AM 02/01/2023    8:15 AM  Fall Risk   Falls in the past year? 0 0 1 1 1   Number falls in past yr: 0 0 1 1 1   Injury with Fall? 0 0 1 1 1   Risk for fall due to : No Fall Risks No Fall Risks Impaired balance/gait Impaired balance/gait Impaired balance/gait;No Fall Risks  Follow up Falls prevention discussed;Falls evaluation completed  Falls evaluation completed      MEDICARE RISK AT HOME:  Medicare Risk at Home Any stairs in or around the home?: Yes If so, are there any without handrails?: No Home free of loose throw rugs in walkways, pet beds, electrical cords, etc?: Yes Adequate lighting in your home to reduce risk of falls?: Yes Life alert?: No Use of a cane, walker or w/c?: Yes Grab bars in the bathroom?: Yes Shower chair or bench in shower?: Yes Elevated toilet seat or a handicapped toilet?: Yes  TIMED UP AND GO:  Was the test performed?  No  Cognitive Function: 6CIT completed        12/27/2023   10:52 AM 12/23/2022    9:38 AM 12/03/2021    9:36 AM 08/11/2020    1:20 PM 07/17/2019    2:51 PM  6CIT Screen  What Year? 0 points 0 points 0 points 0 points 0 points  What month? 0 points 0 points 0 points 0 points 0 points  What time? 0 points 0 points 0 points  0 points  Count back from 20 0 points 0 points 0 points 0 points 0 points  Months in reverse 0  points 0 points 0 points 0 points 0 points  Repeat phrase 0 points 0 points 0 points 0 points 0 points  Total Score 0 points 0 points 0 points  0 points    Immunizations Immunization History  Administered Date(s) Administered   Fluad Quad(high Dose 65+) 06/25/2021, 07/29/2022   Fluad Trivalent(High Dose 65+) 06/27/2023   Influenza Split 07/14/2012   Influenza, High Dose Seasonal PF 07/28/2015, 09/14/2016, 10/07/2017, 07/11/2018, 05/31/2019   Influenza,inj,Quad PF,6+ Mos 07/16/2013, 07/25/2014   Influenza,inj,quad, With Preservative 07/16/2019   PFIZER(Purple Top)SARS-COV-2 Vaccination 11/19/2019, 12/14/2019, 08/22/2020   Pneumococcal Conjugate-13 03/13/2014   Pneumococcal Polysaccharide-23 07/16/2013   Tdap 01/25/2023   Tetanus 03/13/2014   Zoster Recombinant(Shingrix) 02/09/2022, 05/07/2022    Screening Tests Health Maintenance  Topic Date Due   FOOT EXAM  10/31/2019   OPHTHALMOLOGY EXAM  05/21/2022   HEMOGLOBIN A1C  04/30/2024   Diabetic  kidney evaluation - eGFR measurement  10/31/2024   Diabetic kidney evaluation - Urine ACR  10/31/2024   Medicare Annual Wellness (AWV)  12/26/2024   DTaP/Tdap/Td (2 - Td or Tdap) 01/24/2033   Pneumonia Vaccine 30+ Years old  Completed   INFLUENZA VACCINE  Completed   Zoster Vaccines- Shingrix  Completed   HPV VACCINES  Aged Out   COVID-19 Vaccine  Discontinued    Health Maintenance  Health Maintenance Due  Topic Date Due   FOOT EXAM  10/31/2019   OPHTHALMOLOGY EXAM  05/21/2022   Health Maintenance Items Addressed: See Nurse Notes  Additional Screening:  Vision Screening: Recommended annual ophthalmology exams for early detection of glaucoma and other disorders of the eye.  Dental Screening: Recommended annual dental exams for proper oral hygiene  Community Resource Referral / Chronic Care Management: CRR required this visit?  No   CCM required this visit?  No     Plan:     I have personally reviewed and noted the  following in the patient's chart:   Medical and social history Use of alcohol, tobacco or illicit drugs  Current medications and supplements including opioid prescriptions. Patient is currently taking opioid prescriptions. Information provided to patient regarding non-opioid alternatives. Patient advised to discuss non-opioid treatment plan with their provider. Functional ability and status Nutritional status Physical activity Advanced directives List of other physicians Hospitalizations, surgeries, and ER visits in previous 12 months Vitals Screenings to include cognitive, depression, and falls Referrals and appointments  In addition, I have reviewed and discussed with patient certain preventive protocols, quality metrics, and best practice recommendations. A written personalized care plan for preventive services as well as general preventive health recommendations were provided to patient.     Marzella Schlein, LPN   1/61/0960   After Visit Summary: (MyChart) Due to this being a telephonic visit, the after visit summary with patients personalized plan was offered to patient via MyChart   Notes: Please refer to Routing Comments.

## 2023-12-27 NOTE — Patient Instructions (Signed)
 Brent Taylor , Thank you for taking time to come for your Medicare Wellness Visit. I appreciate your ongoing commitment to your health goals. Please review the following plan we discussed and let me know if I can assist you in the future.   Referrals/Orders/Follow-Ups/Clinician Recommendations: Each day, aim for 6 glasses of water, plenty of protein in your diet and try to get up and walk/ stretch every hour for 5-10 minutes at a time.    This is a list of the screening recommended for you and due dates:  Health Maintenance  Topic Date Due   Complete foot exam   10/31/2019   Eye exam for diabetics  05/21/2022   Hemoglobin A1C  04/30/2024   Yearly kidney function blood test for diabetes  10/31/2024   Yearly kidney health urinalysis for diabetes  10/31/2024   Medicare Annual Wellness Visit  12/26/2024   DTaP/Tdap/Td vaccine (2 - Td or Tdap) 01/24/2033   Pneumonia Vaccine  Completed   Flu Shot  Completed   Zoster (Shingles) Vaccine  Completed   HPV Vaccine  Aged Out   COVID-19 Vaccine  Discontinued    Advanced directives: (In Chart) A copy of your advanced directives are scanned into your chart should your provider ever need it.  Next Medicare Annual Wellness Visit scheduled for next year: Yes

## 2023-12-28 ENCOUNTER — Other Ambulatory Visit: Payer: Self-pay | Admitting: Family Medicine

## 2024-01-16 ENCOUNTER — Other Ambulatory Visit: Payer: Self-pay | Admitting: Family Medicine

## 2024-01-16 ENCOUNTER — Other Ambulatory Visit: Payer: Self-pay

## 2024-01-16 MED ORDER — FUROSEMIDE 20 MG PO TABS
20.0000 mg | ORAL_TABLET | Freq: Every day | ORAL | 1 refills | Status: DC
  Filled 2024-01-16: qty 30, 30d supply, fill #0

## 2024-01-17 ENCOUNTER — Encounter: Payer: Self-pay | Admitting: Family Medicine

## 2024-01-17 MED ORDER — FUROSEMIDE 20 MG PO TABS: 20.0000 mg | ORAL_TABLET | Freq: Every day | ORAL | 1 refills | Status: DC

## 2024-01-18 ENCOUNTER — Other Ambulatory Visit (HOSPITAL_COMMUNITY): Payer: Self-pay

## 2024-01-18 ENCOUNTER — Telehealth: Payer: Self-pay | Admitting: Pharmacy Technician

## 2024-01-18 NOTE — Telephone Encounter (Signed)
 Pharmacy Patient Advocate Encounter   Received notification from CoverMyMeds that prior authorization for FreeStyle Libre 3 Sensor is required/requested.   Insurance verification completed.   The patient is insured through [facility] ADVANTAGE/RX ADVANCE .   Per test claim: Refill too soon. PA is not needed at this time. Medication was filled 01/17/2024. Next eligible fill date is 02/07/2024.  ACHIEVED CMM KEY# [id number]

## 2024-01-23 NOTE — Telephone Encounter (Signed)
 LVM Rx lasix was send on 01/17/2024 to [facility] pharmacy  If any question please give us  a call

## 2024-01-27 ENCOUNTER — Encounter (HOSPITAL_COMMUNITY): Payer: Self-pay | Admitting: Physician Assistant

## 2024-01-27 ENCOUNTER — Ambulatory Visit (HOSPITAL_COMMUNITY)
Admission: RE | Admit: 2024-01-27 | Discharge: 2024-01-27 | Disposition: A | Discharge status: Home / Self Care | Payer: PPO | Source: Ambulatory Visit | Attending: Physician Assistant | Admitting: Physician Assistant

## 2024-01-27 VITALS — BP 104/74 | HR 65 | Ht 72.0 in | Wt 243.4 lb

## 2024-01-27 DIAGNOSIS — Z79899 Other long term (current) drug therapy: Secondary | ICD-10-CM | POA: Insufficient documentation

## 2024-01-27 DIAGNOSIS — E119 Type 2 diabetes mellitus without complications: Secondary | ICD-10-CM | POA: Insufficient documentation

## 2024-01-27 DIAGNOSIS — G4733 Obstructive sleep apnea (adult) (pediatric): Secondary | ICD-10-CM | POA: Insufficient documentation

## 2024-01-27 DIAGNOSIS — D6869 Other thrombophilia: Secondary | ICD-10-CM | POA: Insufficient documentation

## 2024-01-27 DIAGNOSIS — I4819 Other persistent atrial fibrillation: Secondary | ICD-10-CM | POA: Insufficient documentation

## 2024-01-27 DIAGNOSIS — E785 Hyperlipidemia, unspecified: Secondary | ICD-10-CM | POA: Insufficient documentation

## 2024-01-27 DIAGNOSIS — I484 Atypical atrial flutter: Secondary | ICD-10-CM | POA: Diagnosis not present

## 2024-01-27 DIAGNOSIS — Z7901 Long term (current) use of anticoagulants: Secondary | ICD-10-CM | POA: Diagnosis not present

## 2024-01-27 DIAGNOSIS — I2510 Atherosclerotic heart disease of native coronary artery without angina pectoris: Secondary | ICD-10-CM | POA: Insufficient documentation

## 2024-01-27 MED ORDER — FUROSEMIDE 20 MG PO TABS: 20.0000 mg | ORAL_TABLET | Freq: Every day | ORAL | 3 refills | Status: AC

## 2024-01-27 NOTE — Progress Notes (Signed)
 Primary Care Physician: Brent Worth HERO, MD Primary Cardiologist: Brent DELENA Leavens, MD Electrophysiologist: None  Sleep Medicine: Dr Brent Taylor Referring Physician: Dr Brent Taylor is a 83 y.o. male with a history of CAD, HLD, OSA, DM, atrial fibrillation who presents for follow up in the [facility]. Patient is on Eliquis  for a CHADS2VASC score of 5. The patient was seen by Dr Taylor on 05/25/23 with increased DOE and lower extremity edema. He was in afib at that time. A Zio monitor was placed which showed 100% afib burden. His last ECG in SR was January of 2023.  Patient was scheduled for DCCV but this was cancelled due to acute anemia. His hgb nadir was 9.9. He was evaluated by GI and underwent a colonoscopy and endoscopy about two weeks ago which did not show any acute bleeding. His hgb normalized with with oral iron supplementation.   He is s/p DCCV on 11/11/23. He sent a Mychart message on 11/14/23 reporting mildly elevated heart rates. ECG confirmed he was back in atrial flutter.   Patient returns for follow up for atrial fibrillation and atrial flutter. Patient reports that he feels well and is unaware of his arrhythmia most of the time. Occasionally, his heart rate will get up to ~110 bpm but will go back down < 100 within about 10 minutes. No bleeding issues on anticoagulation.   Today, he  denies symptoms of palpitations, chest pain, shortness of breath, orthopnea, PND, lower extremity edema, dizziness, presyncope, syncope, bleeding, or neurologic sequela. The patient is tolerating medications without difficulties and is otherwise without complaint today.    Atrial Fibrillation Risk Factors:  he does have symptoms or diagnosis of sleep apnea. he does not have a history of rheumatic fever.   Atrial Fibrillation Management history:  Previous antiarrhythmic drugs: none Previous cardioversions: 11/11/23 Previous ablations:  none Anticoagulation history: Eliquis   ROS- All systems are reviewed and negative except as per the HPI above.  Past Medical History:  Diagnosis Date   Atrial fibrillation (HCC)    Cancer (HCC)    hx - melanoma   Chicken pox    Colon polyps    Coronary artery disease    Depression    No need for therapy.  Improved   Diabetes (HCC)    Diverticula, colon    Duodenal nodule    Dyspnea    with exertion   GERD (gastroesophageal reflux disease)    Hemorrhoids    Hyperlipidemia    Hypertension    Sepsis (HCC)    Sepsis (HCC)    Sleep apnea    Tinnitus    UTI (lower urinary tract infection)     Current Outpatient Medications  Medication Sig Dispense Refill   acetaminophen  (TYLENOL ) 650 MG CR tablet Take 650-1,300 mg by mouth daily as needed for pain.     Bacillus Coagulans-Inulin (ALIGN PREBIOTIC-PROBIOTIC PO) Take by mouth.     Blood Glucose Monitoring Suppl (ONE TOUCH ULTRA 2) w/Device KIT Use daily to check blood sugar. 1 kit 0   Continuous Glucose Sensor (FREESTYLE LIBRE 3 SENSOR) MISC Place 1 sensor on the skin Q14 days. Use to check glucose continuously Code E11.6 2 each 5   cyanocobalamin  (VITAMIN B12) 1000 MCG tablet Take 1,000 mcg by mouth once a week.     ELIQUIS  5 MG TABS tablet TAKE 1 TABLET BY MOUTH 2 TIMES A DAY 60 tablet 5   ferrous sulfate  325 (65 FE) MG EC tablet  Take 325 mg by mouth daily with breakfast.     FLUoxetine  (PROZAC ) 40 MG capsule TAKE 1 CAPSULE BY MOUTH DAILY 90 capsule 3   fluticasone (FLONASE) 50 MCG/ACT nasal spray Place 1 spray into both nostrils daily as needed for allergies or rhinitis.     furosemide  (LASIX ) 20 MG tablet Take 1 tablet (20 mg total) by mouth daily. 30 tablet 1   Lancets (ONETOUCH ULTRASOFT) lancets USE TO TEST BLOOD SUGAR DAILY 100 each 12   losartan  (COZAAR ) 50 MG tablet TAKE 1 TABLET BY MOUTH DAILY 90 tablet 0   metoprolol  tartrate (LOPRESSOR ) 25 MG tablet Take 1 tablet (25 mg total) by mouth 2 (two) times daily. 180 tablet 3    ondansetron  (ZOFRAN -ODT) 4 MG disintegrating tablet Take 1 tablet (4 mg total) by mouth every 8 (eight) hours as needed for nausea or vomiting. 12 tablet 0   ONETOUCH ULTRA TEST test strip USE TO TEST BLOOD SUGAR AS DIRECTED 100 strip 3   pantoprazole  (PROTONIX ) 40 MG tablet Take 40 mg by mouth daily.     pravastatin  (PRAVACHOL ) 80 MG tablet TAKE ONE TABLET BY MOUTH EVERY EVENING 90 tablet 2   tadalafil  (CIALIS ) 20 MG tablet Take 1 tablet (20 mg total) by mouth every other day as needed for erectile dysfunction. 30 tablet 5   traMADol  (ULTRAM ) 50 MG tablet Take 50 mg by mouth daily as needed for moderate pain or severe pain.     No current facility-administered medications for this encounter.    Physical Exam: BP 104/74   Pulse 65   Ht 6' (1.829 m)   Wt 110.4 kg   BMI 33.01 kg/m   GEN: Well nourished, well developed in no acute distress CARDIAC: Irregularly irregular rate and rhythm, no murmurs, rubs, gallops RESPIRATORY:  Clear to auscultation without rales, wheezing or rhonchi  ABDOMEN: Soft, non-tender, non-distended EXTREMITIES:  No edema; No deformity    Wt Readings from Last 3 Encounters:  01/27/24 110.4 kg  12/27/23 107 kg  11/25/23 107 kg     EKG today demonstrates  Atypical atrial flutter with variable block, PVC, LAFB Vent. rate 65 BPM PR interval * ms QRS duration 108 ms QT/QTcB 416/432 ms   Echo 12/23/22 demonstrated   1. Left ventricular ejection fraction, by estimation, is 50 to 55%. The  left ventricle has low normal function. The left ventricle has no regional  wall motion abnormalities. There is mild concentric left ventricular  hypertrophy. Left ventricular diastolic parameters are indeterminate.   2. Right ventricular systolic function is normal. The right ventricular  size is normal. There is normal pulmonary artery systolic pressure.   3. Left atrial size was severely dilated.   4. Right atrial size was severely dilated.   5. The mitral valve is  normal in structure. Mild mitral valve  regurgitation. No evidence of mitral stenosis.   6. The aortic valve is tricuspid. Aortic valve regurgitation is not  visualized. No aortic stenosis is present.   7. Aortic dilatation noted. There is moderate dilatation of the ascending  aorta, measuring 46 mm.   8. The inferior vena cava is normal in size with greater than 50%  respiratory variability, suggesting right atrial pressure of 3 mmHg.    CHA2DS2-VASc Score = 5  The patient's score is based upon: CHF History: 0 HTN History: 1 Diabetes History: 1 Stroke History: 0 Vascular Disease History: 1 Age Score: 2 Gender Score: 0       ASSESSMENT AND PLAN:  Persistent Atrial Fibrillation/atypical atrial flutter The patient's CHA2DS2-VASc score is 5, indicating a 7.2% annual risk of stroke.   S/p DCCV 10/2023 with quick return of atrial flutter Patient remains in rate controlled flutter. We revisited rate vs rhythm control. He would like to continue with a conservative rate control strategy.  Continue Eliquis  5 mg BID Continue Lopressor  25 mg BID  Secondary Hypercoagulable State (ICD10:  D68.69) The patient is at significant risk for stroke/thromboembolism based upon his CHA2DS2-VASc Score of 5.  Continue Apixaban  (Eliquis ). No bleeding issues.   OSA  Encouraged nightly CPAP Followed by Dr Brent Taylor  CAD No anginal symptoms Followed by Dr Brent Taylor     Follow up with Dr Brent Taylor or APP in 3 months. AF clinic as needed.       Brent Kicks PA-C [facility] [place] [address] [place], [place] [address] [phone]

## 2024-01-27 NOTE — Addendum Note (Signed)
 Encounter addended by: Tess Fife, RN on: 01/27/2024 11:48 AM  Actions taken: Order list changed

## 2024-01-30 DIAGNOSIS — G4733 Obstructive sleep apnea (adult) (pediatric): Secondary | ICD-10-CM | POA: Diagnosis not present

## 2024-03-17 IMAGING — CT CT MAXILLOFACIAL W/ CM
3 of 5 series · 15 of 47 positions shown, 18 images · IV contrast (agent unspecified)
Comparison: None available.

CLINICAL DATA: Initial evaluation for acute maxillofacial abscess.

EXAM:
CT MAXILLOFACIAL WITH CONTRAST
TECHNIQUE: Multidetector CT imaging of the maxillofacial structures was
performed with intravenous contrast. Multiplanar CT image
reconstructions were also generated.

[Series 3: facialbone 2.0 st · axial · 0.42mm/px · z∈[-204,-50]mm · 10 of 91 slices shown, 13 images]
[im 7/91  brain]
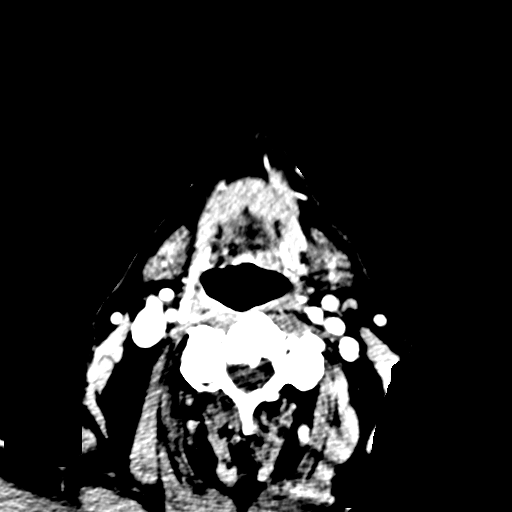
[im 7/91  bone]
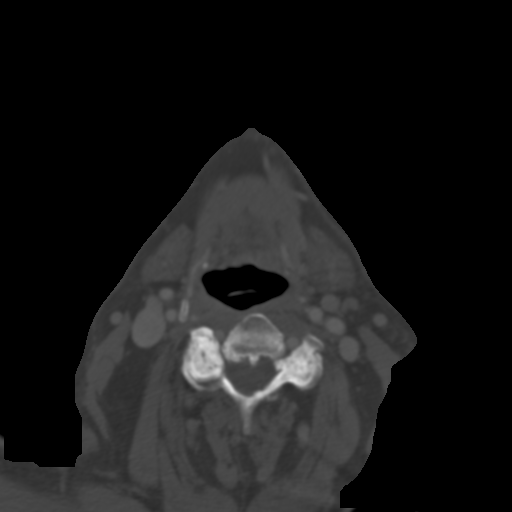
[im 16/91  bone]
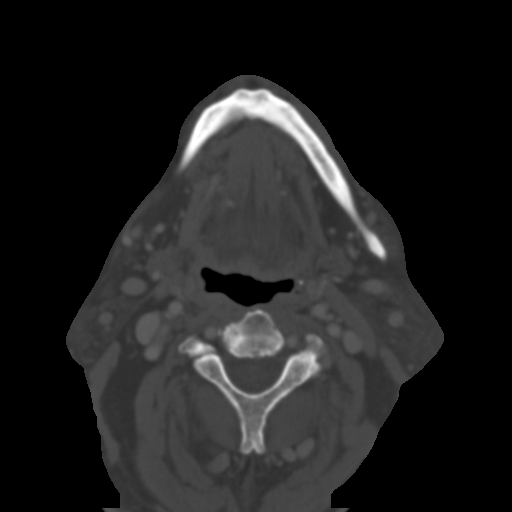
[im 25/91  bone]
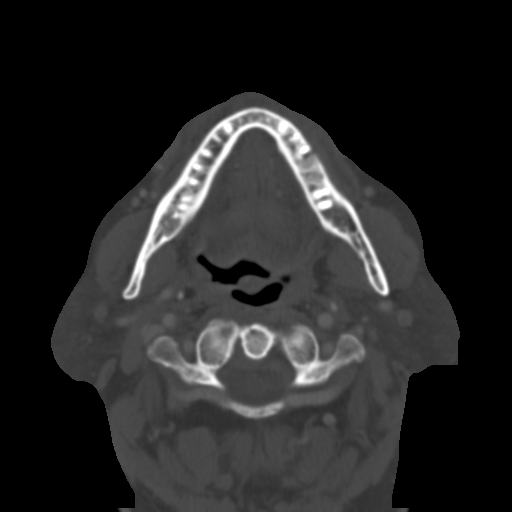
[im 32/91  bone]
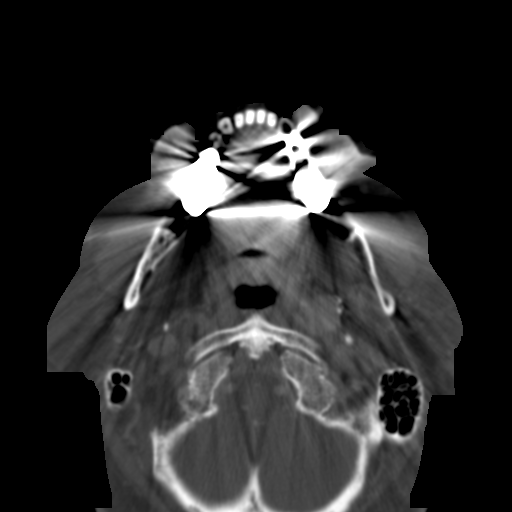
[im 41/91  brain]
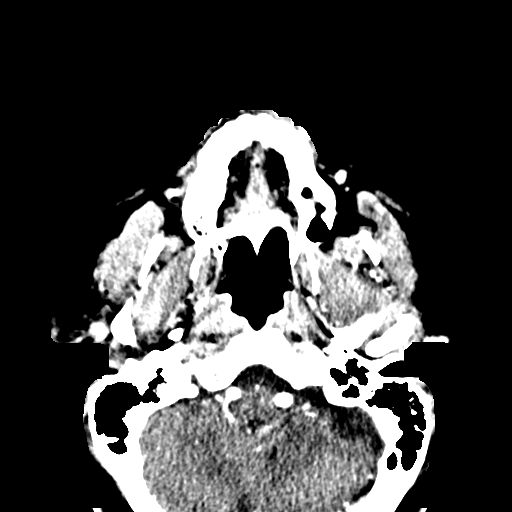
[im 41/91  bone]
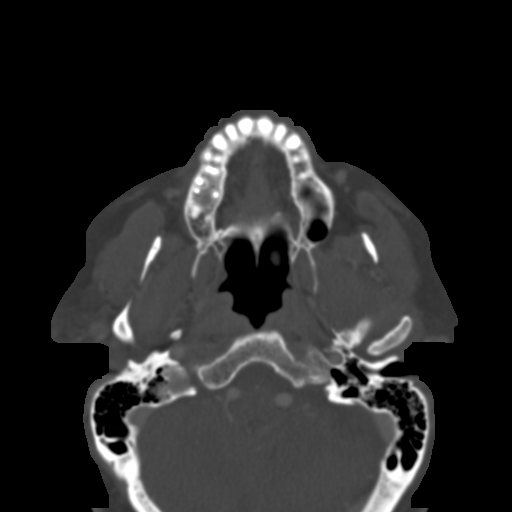
[im 50/91  bone]
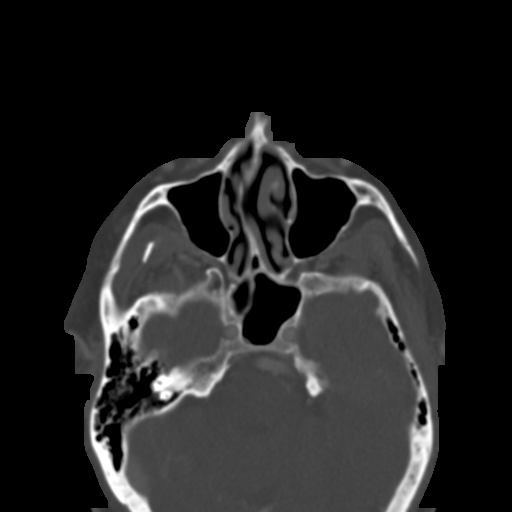
[im 59/91  bone]
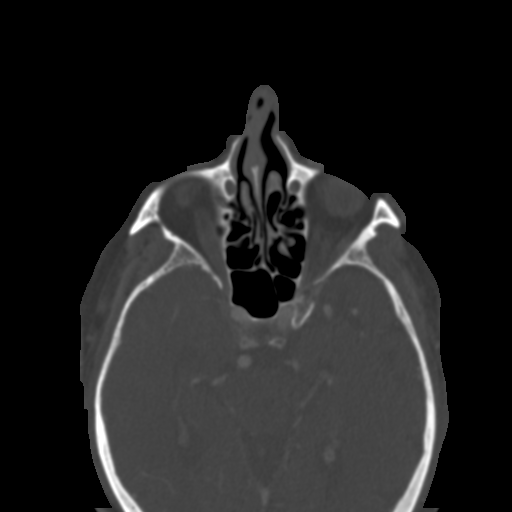
[im 69/91  bone]
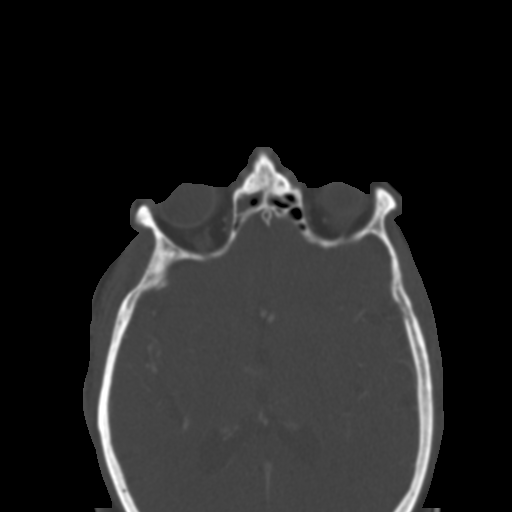
[im 75/91  brain]
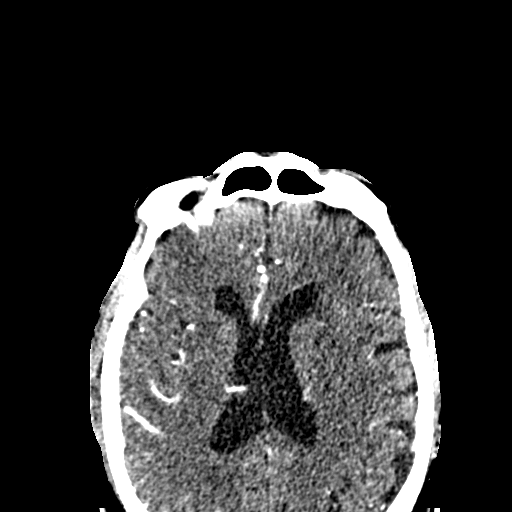
[im 75/91  bone]
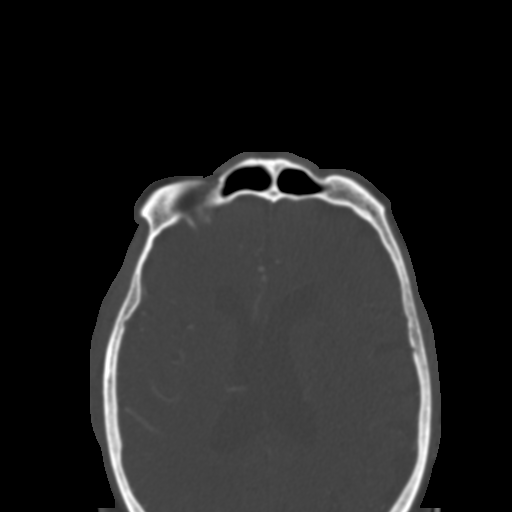
[im 84/91  bone]
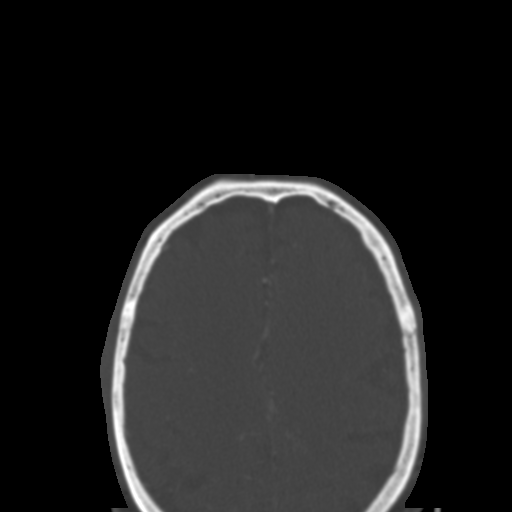

[Series 7: bone 2.0 cor · coronal · 0.38mm/px · 3 of 111 slices shown]
[im 28/111  bone]
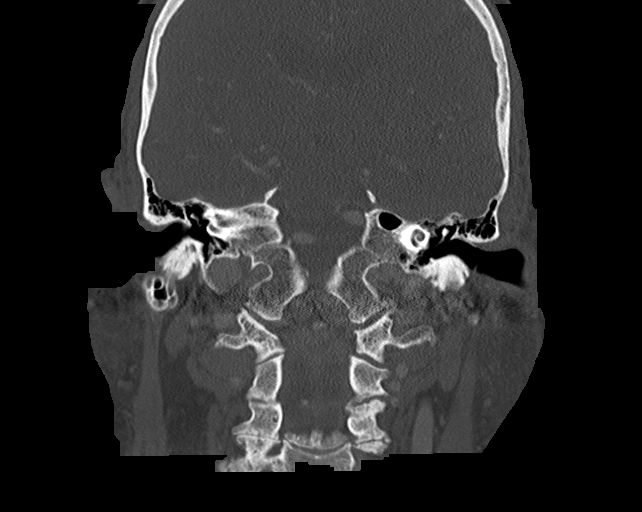
[im 56/111  bone]
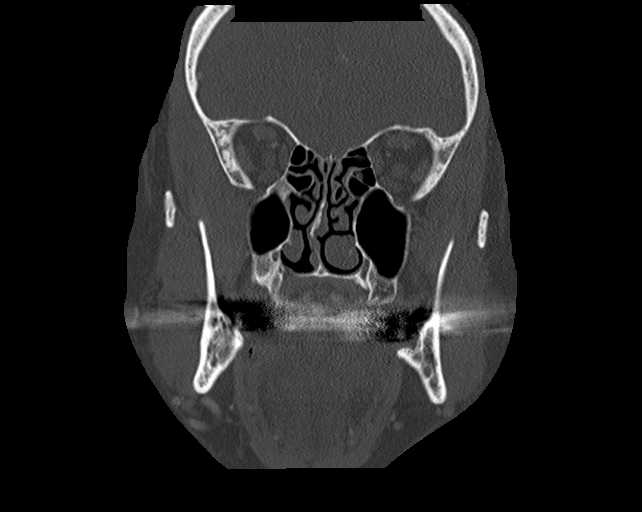
[im 83/111  bone]
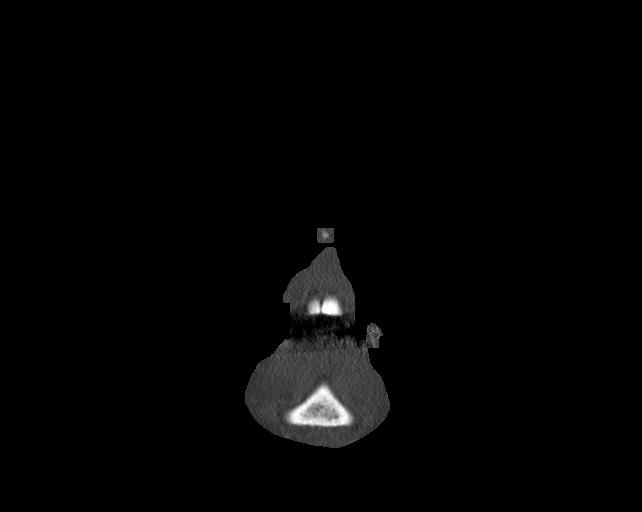

[Series 9: facialbone 2.0 sag st · sagittal · 0.38mm/px · 2 of 122 slices shown]
[im 41/122  bone]
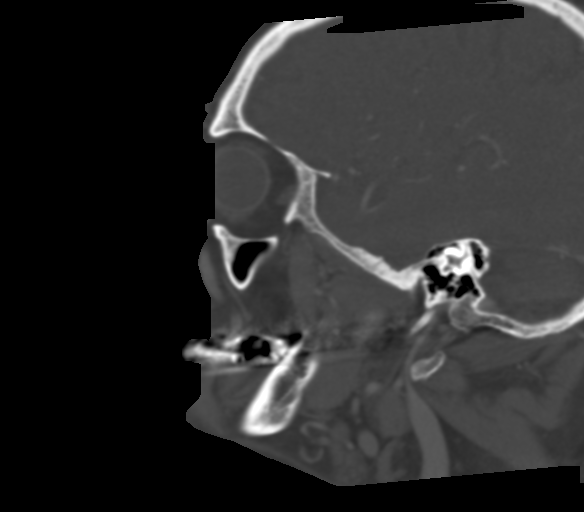
[im 81/122  bone]
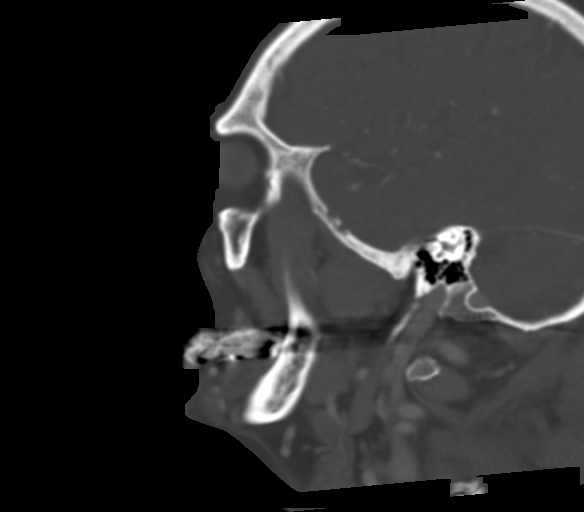

[15 of 47 positions shown; findings below may reference images not displayed]

RADIATION DOSE REDUCTION: This exam was performed according to the
departmental dose-optimization program which includes automated
exposure control, adjustment of the mA and/or kV according to
patient size and/or use of iterative reconstruction technique.

CONTRAST:  100mL OMNIPAQUE IOHEXOL 300 MG/ML  SOLN
FINDINGS: Osseous: No acute osseous finding. No discrete or worrisome osseous
lesions.

Orbits: Globes and orbital soft tissues within normal limits. Prior
bilateral ocular lens replacement.

Sinuses: Mild mucoperiosteal thickening present about the ethmoidal
air cells and maxillary sinuses. Mastoid air cells and middle ear
cavities are clear.

Soft tissues: No significant soft tissue swelling seen about the
face by CT. No visible significant inflammatory changes about the
dentition. Visualized parotid and submandibular glands within normal
limits. No adenopathy within the upper neck. Visualized oral cavity,
oropharynx and nasopharynx within normal limits. No retropharyngeal
swelling. Visualized epiglottis negative. Vascular calcifications
noted about the carotid bifurcations. Focal tortuosity of the distal
cervical left ICA noted.

Limited intracranial: Atrophy.  Otherwise unremarkable.
IMPRESSION: 1. No significant soft tissue swelling or inflammatory changes
identified about the face. No discrete abscess or drainable fluid
collection.
2. Mild ethmoidal and maxillary sinus disease.

## 2024-03-24 ENCOUNTER — Other Ambulatory Visit: Payer: Self-pay | Admitting: Family Medicine

## 2024-04-12 ENCOUNTER — Encounter: Payer: Self-pay | Admitting: Physician Assistant

## 2024-04-25 ENCOUNTER — Ambulatory Visit: Attending: Internal Medicine | Admitting: Internal Medicine

## 2024-04-25 ENCOUNTER — Encounter: Payer: Self-pay | Admitting: Internal Medicine

## 2024-04-25 VITALS — BP 137/88 | HR 68 | Ht 72.0 in | Wt 248.0 lb

## 2024-04-25 DIAGNOSIS — R0609 Other forms of dyspnea: Secondary | ICD-10-CM | POA: Insufficient documentation

## 2024-04-25 DIAGNOSIS — I152 Hypertension secondary to endocrine disorders: Secondary | ICD-10-CM | POA: Diagnosis not present

## 2024-04-25 DIAGNOSIS — E1159 Type 2 diabetes mellitus with other circulatory complications: Secondary | ICD-10-CM

## 2024-04-25 DIAGNOSIS — I2510 Atherosclerotic heart disease of native coronary artery without angina pectoris: Secondary | ICD-10-CM

## 2024-04-25 DIAGNOSIS — I4819 Other persistent atrial fibrillation: Secondary | ICD-10-CM | POA: Diagnosis not present

## 2024-04-25 MED ORDER — LOSARTAN POTASSIUM 25 MG PO TABS: 25.0000 mg | ORAL_TABLET | Freq: Every day | ORAL | 3 refills | Status: DC

## 2024-04-25 MED ORDER — METOPROLOL TARTRATE 50 MG PO TABS: 50.0000 mg | ORAL_TABLET | Freq: Two times a day (BID) | ORAL | 3 refills | Status: DC

## 2024-04-25 NOTE — Progress Notes (Signed)
 Cardiology Office Note:    Date:  04/25/2024   ID:  Brent Taylor, DOB May 08, 1941, MRN [id number]  PCP:  [name], MD   [facility] Providers Cardiologist:  Stanly DELENA Leavens, MD Sleep Medicine:  Wilbert Bihari, MD     Referring MD: [name], MD   CC: AF f/u  History of Present Illness:    Brent Taylor is a 83 y.o. male with a hx of PAF, CAD in [place] in 2007 with moderate disease NOS, HLD, and incidental mild aortic dilation (4.0 cm) who presents for evaluation.  Seen 11/10/21 after CT aorta showed mild increase in diameter (42 mm). 2023: BP elevations 2024: had surgery.  Had slightly elevated K.  Improved symptoms. 2025: Discussed DCCV was in SR; Discussed ablation (Deferred)  Brent Taylor is an 83 year old male with paroxysmal atrial fibrillation and coronary artery disease who presents with symptoms of atrial fibrillation and dyspnea on exertion.  He has a history of paroxysmal atrial fibrillation and coronary artery disease. Recently, he experienced episodes where his heart rate increased to over 100 beats per minute, occurring about a week and a half ago. During these episodes, he feels unwell, prompting a medication adjustment by halving his losartan  dose and doubling his metoprolol  dose. Despite these changes, he feels 'craggy' and lacks energy to engage in activities, feeling unwell 85% of the time.  His coronary artery disease was identified as moderate non-obstructive disease in [place]  and managed medically without stenting. He recalls being on a medication for this condition, which he cannot remember, but it was effective until atrial fibrillation symptoms became more prominent. He reports dyspnea on exertion, especially when walking long distances or climbing stairs.  His family history includes a minor heart skip, which he believes is hereditary as his mother also had it. He has been symptomatic with atrial fibrillation, and a previous discussion about  ablation was held, but it was decided not to proceed with the procedure at that time. He is currently on losartan  25 mg and metoprolol  tartrate 50 mg.  Past Medical History:  Diagnosis Date   Atrial fibrillation (HCC)    Cancer (HCC)    hx - melanoma   Chicken pox    Colon polyps    Coronary artery disease    Depression    No need for therapy.  Improved   Diabetes (HCC)    Diverticula, colon    Duodenal nodule    Dyspnea    with exertion   GERD (gastroesophageal reflux disease)    Hemorrhoids    Hyperlipidemia    Hypertension    Sepsis (HCC)    Sepsis (HCC)    Sleep apnea    Tinnitus    UTI (lower urinary tract infection)     Past Surgical History:  Procedure Laterality Date   CARDIAC CATHETERIZATION     CARDIOVERSION N/A 11/11/2023   Procedure: CARDIOVERSION;  Surgeon: Bihari Wilbert SAUNDERS, MD;  Location: [facility] INV[facility]IVE [facility] LAB;  Service: Cardiovascular;  Laterality: N/A;   ESOPHAGOGASTRODUODENOSCOPY N/A 02/05/2023   Procedure: ESOPHAGOGASTRODUODENOSCOPY (EGD);  Surgeon: Eda Iha, MD;  Location: [facility] ENDOSCOPY;  Service: Gastroenterology;  Laterality: N/A;   FOREIGN BODY REMOVAL  02/05/2023   Procedure: FOREIGN BODY REMOVAL;  Surgeon: Eda Iha, MD;  Location: [facility] ENDOSCOPY;  Service: Gastroenterology;;   LUMBAR LAMINECTOMY/DECOMPRESSION MICRODISCECTOMY N/A 11/08/2022   Procedure: LUMBAR FOUR FIVE DECOMPRESSION AND IN SITU FUSION;  Surgeon: Burnetta Aures, MD;  Location: [facility] OR;  Service: Orthopedics;  Laterality:  N/A;    MELANOMA EXCISION     LEFT CHEST   MELANOMA EXCISION      Current Medications: Current Meds  Medication Sig   Bacillus Coagulans-Inulin (ALIGN PREBIOTIC-PROBIOTIC PO) Take by mouth.   Continuous Glucose Sensor (FREESTYLE LIBRE 3 SENSOR) MISC Place 1 sensor on the skin Q14 days. Use to check glucose continuously Code E11.6   cyanocobalamin  (VITAMIN B12) 1000 MCG tablet Take 1,000 mcg by mouth once a week.   ELIQUIS  5 MG TABS tablet TAKE 1  TABLET BY MOUTH 2 TIMES A DAY   ferrous sulfate 325 (65 FE) MG EC tablet Take 325 mg by mouth daily with breakfast.   FLUoxetine  (PROZAC ) 40 MG capsule TAKE 1 CAPSULE BY MOUTH DAILY   fluticasone (FLONASE) 50 MCG/ACT nasal spray Place 1 spray into both nostrils daily as needed for allergies or rhinitis.   furosemide  (LASIX ) 20 MG tablet Take 1 tablet (20 mg total) by mouth daily.   losartan  (COZAAR ) 25 MG tablet Take 1 tablet (25 mg total) by mouth daily.   metoprolol  tartrate (LOPRESSOR ) 50 MG tablet Take 1 tablet (50 mg total) by mouth 2 (two) times daily.   pantoprazole  (PROTONIX ) 40 MG tablet Take 40 mg by mouth daily.   pravastatin  (PRAVACHOL ) 80 MG tablet TAKE ONE TABLET BY MOUTH EVERY EVENING   [DISCONTINUED] losartan  (COZAAR ) 50 MG tablet TAKE 1 TABLET BY MOUTH DAILY (Patient taking differently: Take 25 mg by mouth daily.)   [DISCONTINUED] metoprolol  tartrate (LOPRESSOR ) 25 MG tablet Take 1 tablet (25 mg total) by mouth 2 (two) times daily. (Patient taking differently: Take 50 mg by mouth 2 (two) times daily.)     Allergies:   Gabapentin  and Lipitor [atorvastatin calcium ]   Social History   Socioeconomic History   Marital status: Married    Spouse name: Not on file   Number of children: 2   Years of education: Not on file   Highest education level: Master's degree (e.g., MA, MS, MEng, MEd, MSW, MBA)  Occupational History   Occupation: Optician, dispensing   Occupation: retired  Tobacco Use   Smoking status: Former    Types: Cigarettes   Smokeless tobacco: Never   Tobacco comments:    Former smoker 06/17/23  Vaping Use   Vaping status: Never Used  Substance and Sexual Activity   Alcohol use: No   Drug use: No   Sexual activity: Not on file  Other Topics Concern   Not on file  Social History Narrative   Lives with wife in [place].   Retired [employer]   Attends [employer]     Social Drivers of Health   Financial Resource Strain: Low Risk  (12/23/2023)    Overall Financial Resource Strain (CARDIA)    Difficulty of Paying Living Expenses: Not hard at all  Food Insecurity: No Food Insecurity (12/23/2023)   Hunger Vital Sign    Worried About Running Out of Food in the Last Year: Never true    Ran Out of Food in the Last Year: Never true  Transportation Needs: No Transportation Needs (12/23/2023)   PRAPARE - Administrator, Civil Service (Medical): No    Lack of Transportation (Non-Medical): No  Physical Activity: Insufficiently Active (12/23/2023)   Exercise Vital Sign    Days of Exercise per Week: 1 day    Minutes of Exercise per Session: 10 min  Stress: No Stress Concern Present (12/23/2023)   [employer] of Occupational Health - Occupational Stress Questionnaire  Feeling of Stress : Not at all  Social Connections: Socially Integrated (12/23/2023)   Social Connection and Isolation Panel    Frequency of Communication with Friends and Family: More than three times a week    Frequency of Social Gatherings with Friends and Family: Three times a week    Attends Religious Services: More than 4 times per year    Active Member of Clubs or Organizations: Yes    Attends Banker Meetings: 1 to 4 times per year    Marital Status: Married     Family History: The patient's family history includes Heart Problems in his mother. There is no history of Colon cancer, Esophageal cancer, Rectal cancer, or Prostate cancer. History of coronary artery disease notable for father with CABG. History of heart failure notable for father. History of arrhythmia notable for mother with palpitations. Denies family history of sudden cardiac death including drowning, car accidents, or unexplained deaths in the family. No history of bicuspid aortic valve or aortic aneurysm or dissection.    ROS:   Please see the history of present illness.     EKGs/Labs/Other Studies Reviewed:    The following studies were reviewed today:  EKG:   11/25/22: A fib 87 rare PVCs 11/09/2021: SR rate 81 1st HB with PACs and LVH 02/11/21: SR Rate 64 PACs LVH  Cardiac Studies & Procedures   ______________________________________________________________________________________________ CARDIAC CATHETERIZATION  CARDIAC CATHETERIZATION 09/21/2006     ECHOCARDIOGRAM  ECHOCARDIOGRAM COMPLETE 12/23/2022  Narrative ECHOCARDIOGRAM REPORT    Patient Name:   Brent Taylor   Date of Exam: 12/23/2022 Medical Rec #:  [id number]     Height:       72.0 in Accession #:    [id number]    Weight:       237.0 lb Date of Birth:  1941/10/10      BSA:          2.290 m Patient Age:    82 years      BP:           110/80 mmHg Patient Gender: M             HR:           84 bpm. Exam Location:  [address]  Procedure: 2D Echo, 3D Echo, Cardiac Doppler and Color Doppler  Indications:    I77.819 Ascending Aortic Dilation  History:        Patient has prior history of Echocardiogram examinations, most recent 10/16/2021. CAD, Arrythmias:Atrial Fibrillation, Signs/Symptoms:Dyspnea; Risk Factors:Hypertension, Diabetes, Dyslipidemia, Former Smoker, Sleep Apnea and Family History of Coronary Artery Disease.  Sonographer:    Heather Hawks RDCS Referring Phys: [facility] Kally [place]  IMPRESSIONS   1. Left ventricular ejection fraction, by estimation, is 50 to 55%. The left ventricle has low normal function. The left ventricle has no regional wall motion abnormalities. There is mild concentric left ventricular hypertrophy. Left ventricular diastolic parameters are indeterminate. 2. Right ventricular systolic function is normal. The right ventricular size is normal. There is normal pulmonary artery systolic pressure. 3. Left atrial size was severely dilated. 4. Right atrial size was severely dilated. 5. The mitral valve is normal in structure. Mild mitral valve regurgitation. No evidence of mitral stenosis. 6. The aortic valve is tricuspid. Aortic valve  regurgitation is not visualized. No aortic stenosis is present. 7. Aortic dilatation noted. There is moderate dilatation of the ascending aorta, measuring 46 mm. 8. The inferior vena cava is normal in size with greater than  50% respiratory variability, suggesting right atrial pressure of 3 mmHg.  FINDINGS Left Ventricle: Left ventricular ejection fraction, by estimation, is 50 to 55%. The left ventricle has low normal function. The left ventricle has no regional wall motion abnormalities. The left ventricular internal cavity size was normal in size. There is mild concentric left ventricular hypertrophy. Left ventricular diastolic function could not be evaluated due to atrial fibrillation. Left ventricular diastolic parameters are indeterminate. Indeterminate filling pressures.  Right Ventricle: The right ventricular size is normal. No increase in right ventricular wall thickness. Right ventricular systolic function is normal. There is normal pulmonary artery systolic pressure. The tricuspid regurgitant velocity is 2.13 m/s, and with an assumed right atrial pressure of 3 mmHg, the estimated right ventricular systolic pressure is 21.1 mmHg.  Left Atrium: Left atrial size was severely dilated.  Right Atrium: Right atrial size was severely dilated.  Pericardium: There is no evidence of pericardial effusion.  Mitral Valve: The mitral valve is normal in structure. Mild mitral valve regurgitation. No evidence of mitral valve stenosis.  Tricuspid Valve: The tricuspid valve is normal in structure. Tricuspid valve regurgitation is mild . No evidence of tricuspid stenosis.  Aortic Valve: The aortic valve is tricuspid. Aortic valve regurgitation is not visualized. No aortic stenosis is present.  Pulmonic Valve: The pulmonic valve was normal in structure. Pulmonic valve regurgitation is not visualized. No evidence of pulmonic stenosis.  Aorta: Aortic dilatation noted. There is moderate dilatation of the  ascending aorta, measuring 46 mm.  Venous: The inferior vena cava is normal in size with greater than 50% respiratory variability, suggesting right atrial pressure of 3 mmHg.  IAS/Shunts: No atrial level shunt detected by color flow Doppler.   LEFT VENTRICLE PLAX 2D LVIDd:         5.20 cm   Diastology LVIDs:         3.80 cm   LV e' medial:    10.60 cm/s LV PW:         1.10 cm   LV E/e' medial:  11.5 LV IVS:        1.10 cm   LV e' lateral:   13.35 cm/s LVOT diam:     2.40 cm   LV E/e' lateral: 9.1 LV SV:         56 LV SV Index:   24 LVOT Area:     4.52 cm  3D Volume EF: 3D EF:        48 % LV EDV:       162 ml LV ESV:       85 ml LV SV:        77 ml  RIGHT VENTRICLE RV Basal diam:  4.10 cm RV Mid diam:    3.00 cm RV S prime:     12.10 cm/s TAPSE (M-mode): 2.1 cm RVSP:           21.1 mmHg  LEFT ATRIUM              Index        RIGHT ATRIUM           Index LA diam:        4.80 cm  2.10 cm/m   RA Pressure: 3.00 mmHg LA Vol (A2C):   113.0 ml 49.35 ml/m  RA Area:     27.00 cm LA Vol (A4C):   80.5 ml  35.15 ml/m  RA Volume:   95.40 ml  41.66 ml/m LA Biplane Vol: 98.8 ml  43.15 ml/m AORTIC VALVE LVOT Vmax:   78.10 cm/s LVOT Vmean:  50.600 cm/s LVOT VTI:    0.124 m  AORTA Ao Root diam: 3.40 cm Ao Asc diam:  4.60 cm  MITRAL VALVE                TRICUSPID VALVE MV Area (PHT): cm          TR Peak grad:   18.1 mmHg MV Decel Time: 250 msec     TR Vmax:        213.00 cm/s MV E velocity: 122.00 cm/s  Estimated RAP:  3.00 mmHg RVSP:           21.1 mmHg  SHUNTS Systemic VTI:  0.12 m Systemic Diam: 2.40 cm  Annabella Scarce MD Electronically signed by Annabella Scarce MD Signature Date/Time: 12/23/2022/10:26:31 PM    Final    MONITORS  LONG TERM MONITOR (3-14 DAYS) 06/08/2023  Narrative   Predominant underlying rhythm was atrial fibrillation (100% burden).   Several runs of NSVT, longest 11 beats.  May represent aberrant conduction.   Isolated PVCs were  occasional (4.9%).   Triggered and diary events associated with atrial fibrillation.  Atrial fibrillation with occasional PVCS       ______________________________________________________________________________________________       Recent Labs: 06/09/2023: NT-Pro BNP 810 11/01/2023: ALT 15; BUN 16; Creatinine, Ser 1.10; Hemoglobin 16.1; Platelets 186.0; Potassium 4.8; Sodium 142; TSH 2.92  Recent Lipid Panel    Component Value Date/Time   CHOL 162 11/01/2023 0909   CHOL 123 01/25/2022 0910   TRIG 149.0 11/01/2023 0909   HDL 35.30 (L) 11/01/2023 0909   HDL 33 (L) 01/25/2022 0910   CHOLHDL 5 11/01/2023 0909   VLDL 29.8 11/01/2023 0909   LDLCALC 97 11/01/2023 0909   LDLCALC 68 01/25/2022 0910   LDLDIRECT 142.7 12/10/2011 1436    Risk Assessment/Calculations:    CHA2DS2-VASc Score = 5  This indicates a 7.2% annual risk of stroke. The patient's score is based upon: CHF History: 0 HTN History: 1 Diabetes History: 1 Stroke History: 0 Vascular Disease History: 1 Age Score: 2 Gender Score: 0       Physical Exam:    VS:  BP 137/88 (BP Location: Left Arm, Patient Position: Sitting, Cuff Size: Large)   Pulse 68   Ht 6' (1.829 m)   Wt 248 lb (112.5 kg)   SpO2 98%   BMI 33.63 kg/m     Wt Readings from Last 3 Encounters:  04/25/24 248 lb (112.5 kg)  01/27/24 243 lb 6.4 oz (110.4 kg)  12/27/23 236 lb (107 kg)    Gen: No distress   Neck: No JVD Ears: Bilateral Dempsey Sign Cardiac: No Rubs or Gallops, no murmur, IRIR +2 radial pulses Respiratory: Rare rhonchi, , normal effort, normal  respiratory rate GI: Soft, nontender, non-distended MS: noedema;  moves all extremities Integument: Skin feels warm Neuro:  At time of evaluation, alert and oriented to person/place/time/situation  Psych: Normal affect, patient feels fair  ASSESSMENT:    1. DOE (dyspnea on exertion)   2. Persistent atrial fibrillation (HCC)      PLAN:      CAD mod non obstructive LHC in  [place] HLD and Aortic atherosclerosis Statin myalgias with atorvastatin Moderate non-obstructive coronary artery disease managed medically. No classic chest pain symptoms reported. Dyspnea on exertion noted, which may be related to coronary artery disease or atrial fibrillation. Plan to evaluate for potential blood flow issues. - Order stress test  to evaluate coronary blood flow. - If significant blockage is found, consider heart catheterization. - continue current therapy  Paroxysmal atrial fibrillation Paroxysmal atrial fibrillation with an 85% burden, recent episode with heart rate over 100 bpm, symptoms include feeling unwell and occasional shortness of breath. Current management includes rate control with metoprolol . Previous discussion about ablation with electrophysiology, but decision was made not to proceed with ablation at that time. Plan to revisit ablation consideration due to persistent symptoms. - Increase metoprolol  tartrate to 50 mg. - Refer to electrophysiologist in August for consideration of ablation. - continue AC  HTN - drop ARB dose for BB increase  Moderate aortic dilation  - 46 mm, CT later year unless he gets a CT PV  Dyspnea on exertion Dyspnea on exertion possibly related to coronary artery disease or atrial fibrillation. Symptoms include shortness of breath with exertion and fatigue. - Evaluate coronary artery disease with stress test. - Adjust medications to manage atrial fibrillation and heart rate.  OSA - on therapy  Three month f/u with my team  Stanly Leavens, MD [facility] Cardiologist [facility]  [address] [place], [place] [address] [phone]  12:33 PM

## 2024-04-25 NOTE — Patient Instructions (Signed)
 Medication Instructions:  Please increase your Metoprolol  Tartrate to 50 mg twice a day and decrease Losartan  to 25 mg daily. Continue all other medications as listed.  *If you need a refill on your cardiac medications before your next appointment, please call your pharmacy*   Testing/Procedures: You are scheduled for a Lexiscan Myocardial Perfusion Imaging Study on        at         .    Please arrive 15 minutes prior to your appointment time for registration and insurance purposes.   The test will take approximately 3 to 4 hours to complete; you may bring reading material. If someone comes with you to your appointment, they will need to remain in the main lobby due to limited space in the testing area.   If you are pregnant or breastfeeding, please notify the nuclear lab prior to your appointment.   How to prepare for your Lexiscan Myocardial Perfusion test:   Do not eat or drink 3 hours prior to your test, except you may have water.    Do not consume products containing caffeine  (regular or decaffeinated) 12 hours prior to your test (ex: coffee, chocolate, soda, tea)   Do bring a list of your current medications with you. If not listed below, you may take your medications as normal.   Bring any held medication to your appointment, as you may be required to take it once the test is complete.   Do wear comfortable clothes (no dresses or overalls) and walking shoes. Tennis shoes are preferred. No heels or open toed shoes.  Do not wear cologne, perfume, aftershave or lotions (deodorant is allowed).   If these instructions are not followed, you test will have to be rescheduled.   Please report to [address] [place] for your test. If you have questions or concerns about your appointment, please call the Nuclear Lab at #[phone].  If you cannot keep your appointment, please provide 24 hour notification to the Nuclear lab to avoid a possible $50 charge to your account.       Follow-Up: At [facility], you and your health needs are our priority.  As part of our continuing mission to provide you with exceptional heart care, our providers are all part of one team.  This team includes your primary Cardiologist (physician) and Advanced Practice Providers or APPs (Physician Assistants and Nurse Practitioners) who all work together to provide you with the care you need, when you need it.  Your next appointment:   3 month(s)  Provider:   One of our Advanced Practice Providers (APPs): Morse Clause, PA-C  Lamarr Satterfield, NP Miriam Shams, NP  Olivia Pavy, PA-C Josefa Beauvais, NP  Leontine Salen, PA-C Orren Fabry, PA-C  [place], PA-C Ernest Dick, NP  Damien Braver, NP Jon Hails, PA-C  Waddell Donath, PA-C    Dayna Dunn, PA-C  Scott Weaver, PA-C Lum Louis, NP Katlyn West, NP Callie Goodrich, PA-C  Evan Williams, PA-C Sheng Haley, PA-C  Xika Zhao, NP Kathleen Johnson, PA-C       We recommend signing up for the patient portal called MyChart.  Sign up information is provided on this After Visit Summary.  MyChart is used to connect with patients for Virtual Visits (Telemedicine).  Patients are able to view lab/test results, encounter notes, upcoming appointments, etc.  Non-urgent messages can be sent to your provider as well.   To learn more about what you can do with MyChart, go to  [email or web address].   You have been referred to see one of our Electrophysiologist to discuss possible ablation.

## 2024-04-27 ENCOUNTER — Encounter (HOSPITAL_COMMUNITY): Payer: Self-pay | Admitting: *Deleted

## 2024-04-28 DIAGNOSIS — G4733 Obstructive sleep apnea (adult) (pediatric): Secondary | ICD-10-CM | POA: Diagnosis not present

## 2024-04-30 ENCOUNTER — Ambulatory Visit: Payer: PPO | Admitting: Family Medicine

## 2024-04-30 ENCOUNTER — Encounter: Payer: Self-pay | Admitting: Family Medicine

## 2024-04-30 VITALS — BP 137/85 | HR 54 | Temp 97.3°F | Ht 72.0 in | Wt 246.0 lb

## 2024-04-30 DIAGNOSIS — I152 Hypertension secondary to endocrine disorders: Secondary | ICD-10-CM

## 2024-04-30 DIAGNOSIS — E559 Vitamin D deficiency, unspecified: Secondary | ICD-10-CM | POA: Diagnosis not present

## 2024-04-30 DIAGNOSIS — R350 Frequency of micturition: Secondary | ICD-10-CM | POA: Diagnosis not present

## 2024-04-30 DIAGNOSIS — D509 Iron deficiency anemia, unspecified: Secondary | ICD-10-CM

## 2024-04-30 DIAGNOSIS — E1159 Type 2 diabetes mellitus with other circulatory complications: Secondary | ICD-10-CM

## 2024-04-30 LAB — COMPREHENSIVE METABOLIC PANEL WITH GFR
ALT: 16 U/L (ref 0–53)
AST: 20 U/L (ref 0–37)
Albumin: 4.4 g/dL (ref 3.5–5.2)
Alkaline Phosphatase: 48 U/L (ref 39–117)
BUN: 20 mg/dL (ref 6–23)
CO2: 27 meq/L (ref 19–32)
Calcium: 9.1 mg/dL (ref 8.4–10.5)
Chloride: 103 meq/L (ref 96–112)
Creatinine, Ser: 1.25 mg/dL (ref 0.40–1.50)
GFR: 52.52 mL/min — ABNORMAL LOW (ref >60.00)
Glucose, Bld: 135 mg/dL — ABNORMAL HIGH (ref 70–99)
Potassium: 4.2 meq/L (ref 3.5–5.1)
Sodium: 140 meq/L (ref 135–145)
Total Bilirubin: 0.9 mg/dL (ref 0.2–1.2)
Total Protein: 6.9 g/dL (ref 6.0–8.3)

## 2024-04-30 LAB — POCT URINALYSIS DIPSTICK
Bilirubin, UA: NEGATIVE
Blood, UA: NEGATIVE
Glucose, UA: NEGATIVE
Ketones, UA: NEGATIVE
Nitrite, UA: POSITIVE
Protein, UA: NEGATIVE
Spec Grav, UA: 1.02 (ref 1.010–1.025)
Urobilinogen, UA: 0.2 U/dL
pH, UA: 6 (ref 5.0–8.0)

## 2024-04-30 LAB — CBC
HCT: 51.2 % (ref 39.0–52.0)
Hemoglobin: 16.4 g/dL (ref 13.0–17.0)
MCHC: 32 g/dL (ref 30.0–36.0)
MCV: 92.4 fl (ref 78.0–100.0)
Platelets: 163 K/uL (ref 150.0–400.0)
RBC: 5.55 Mil/uL (ref 4.22–5.81)
RDW: 15.1 % (ref 11.5–15.5)
WBC: 5.9 K/uL (ref 4.0–10.5)

## 2024-04-30 LAB — POCT GLYCOSYLATED HEMOGLOBIN (HGB A1C): Hemoglobin A1C: 6.7 % — AB (ref 4.0–5.6)

## 2024-04-30 LAB — VITAMIN D 25 HYDROXY (VIT D DEFICIENCY, FRACTURES): VITD: 23.83 ng/mL — ABNORMAL LOW (ref 30.00–100.00)

## 2024-04-30 LAB — IBC + FERRITIN
Ferritin: 37 ng/mL (ref 22.0–322.0)
Iron: 87 ug/dL (ref 42–165)
Saturation Ratios: 26.3 % (ref 20.0–50.0)
TIBC: 330.4 ug/dL (ref 250.0–450.0)
Transferrin: 236 mg/dL (ref 212.0–360.0)

## 2024-04-30 LAB — MICROALBUMIN / CREATININE URINE RATIO
Creatinine,U: 114.5 mg/dL
Microalb Creat Ratio: 37.7 mg/g — ABNORMAL HIGH (ref 0.0–30.0)
Microalb, Ur: 4.3 mg/dL — ABNORMAL HIGH (ref 0.0–1.9)

## 2024-04-30 MED ORDER — NITROFURANTOIN MONOHYD MACRO 100 MG PO CAPS: 100.0000 mg | ORAL_CAPSULE | Freq: Two times a day (BID) | ORAL | 0 refills | Status: DC

## 2024-04-30 NOTE — Patient Instructions (Signed)
 It was very nice to see you today!  I think you probably have a urinary tract infection.   Please start the antibiotic.  Will check a urine culture and contact you with the results once they come back.  We will check blood work today.  Your A1c today is 6.7.  We do not need to make any medication changes for this however he should continue to work on diet and exercise and reducing your sugar and carbohydrate intake.  I will see you back in 6 months for your annual physical.  Come back sooner if needed.  Return in about 6 months (around 10/31/2024) for Annual Physical.   Take care, Dr Kennyth  PLEASE NOTE:  If you had any lab tests, please let us  know if you have not heard back within a few days. You may see your results on mychart before we have a chance to review them but we will give you a call once they are reviewed by us .   If we ordered any referrals today, please let us  know if you have not heard from their office within the next week.   If you had any urgent prescriptions sent in today, please check with the pharmacy within an hour of our visit to make sure the prescription was transmitted appropriately.   Please try these tips to maintain a healthy lifestyle:  Eat at least 3 REAL meals and 1-2 snacks per day.  Aim for no more than 5 hours between eating.  If you eat breakfast, please do so within one hour of getting up.   Each meal should contain half fruits/vegetables, one quarter protein, and one quarter carbs (no bigger than a computer mouse)  Cut down on sweet beverages. This includes juice, soda, and sweet tea.   Drink at least 1 glass of water with each meal and aim for at least 8 glasses per day  Exercise at least 150 minutes every week.

## 2024-04-30 NOTE — Assessment & Plan Note (Signed)
 Initially elevated today however at goal on recheck.  Continue his current regimen per cardiology with losartan  25 mg daily and metoprolol  tartrate 50 mg twice daily.

## 2024-04-30 NOTE — Assessment & Plan Note (Signed)
 Check vitamin D.

## 2024-04-30 NOTE — Progress Notes (Signed)
   Celia Kilgallon is a 83 y.o. male who presents today for an office visit.  Assessment/Plan:  New/Acute Problems: Hematuria UA consistent with UTI.  We will start Macrobid  while we await culture results.  Encouraged hydration.  We discussed reasons to return to care.  Chronic Problems Addressed Today: Type 2 diabetes mellitus with vascular disease (HCC) A1c 6.7.  Not currently on any medications.  We discussed lifestyle modifications.  Recheck 6 months.  Avitaminosis D Check vitamin D   Iron deficiency anemia Check iron panel.   Hypertension associated with diabetes (HCC) Initially elevated today however at goal on recheck.  Continue his current regimen per cardiology with losartan  25 mg daily and metoprolol  tartrate 50 mg twice daily.     Subjective:  HPI:  See A/P for status of chronic conditions.  Patient is here today for 67-month follow-up.  Last time we saw him his A1c was 6.5.  He was also found to have a low vitamin D  and low but improving iron.  He was continued on supplementation for both of these as well.  Since our last visit he has been following with cardiology for dyspnea on exertion and atrial fibrillation.  He has been referred for stress testing however this has not been completed as of yet.  He has been monitoring his sugar at home which have been in the 140s ranges typically.  He also did note an episode of blood in his urine a few days ago but has not noticed anything since then.  He has noticed more frequency but this seems to be at baseline for him.  No dysuria.  No fevers or chills.       Objective:  Physical Exam: BP 137/85   Pulse (!) 54   Temp (!) 97.3 F (36.3 C) (Temporal)   Ht 6' (1.829 m)   Wt 246 lb (111.6 kg)   SpO2 93%   BMI 33.36 kg/m   Gen: No acute distress, resting comfortably CV: Irregular rate and rhythm with no murmurs appreciated Pulm: Normal work of breathing, clear to auscultation bilaterally with no crackles, wheezes, or  rhonchi Neuro: Grossly normal, moves all extremities Psych: Normal affect and thought content      Kaylan Yates M. Kennyth, MD 04/30/2024 9:39 AM

## 2024-04-30 NOTE — Assessment & Plan Note (Signed)
 Check iron panel

## 2024-04-30 NOTE — Assessment & Plan Note (Signed)
 A1c 6.7.  Not currently on any medications.  We discussed lifestyle modifications.  Recheck 6 months.

## 2024-05-02 LAB — URINE CULTURE
MICRO NUMBER:: 16724007
SPECIMEN QUALITY:: ADEQUATE

## 2024-05-03 ENCOUNTER — Ambulatory Visit: Payer: Self-pay | Admitting: Family Medicine

## 2024-05-03 DIAGNOSIS — N390 Urinary tract infection, site not specified: Secondary | ICD-10-CM

## 2024-05-03 NOTE — Progress Notes (Signed)
 His urine culture confirms UTI.  The antibiotic we have him on should treat this.  Recommend that he come back in a week or 2 to recheck send out urinalysis.  Please place future order.  Vitamin D  is low but similar to last value we checked.  Recommend he continue vitamin D  5000 IUs daily and we should recheck in 3 to 6 months.  His urine did indicate some protein in his urine however this is probably due to the UTI.  We should recheck this in a few months once his UTI clears.  All of his other labs are stable.  Do not need to make any other changes to treatment plan at this time.  We can recheck again in 6 to 12 months.

## 2024-05-04 ENCOUNTER — Other Ambulatory Visit: Payer: Self-pay | Admitting: Internal Medicine

## 2024-05-04 DIAGNOSIS — R0609 Other forms of dyspnea: Secondary | ICD-10-CM

## 2024-05-07 ENCOUNTER — Telehealth (HOSPITAL_COMMUNITY): Payer: Self-pay

## 2024-05-07 ENCOUNTER — Encounter (HOSPITAL_COMMUNITY): Payer: Self-pay

## 2024-05-07 ENCOUNTER — Inpatient Hospital Stay (HOSPITAL_COMMUNITY)
Admission: EM | Admit: 2024-05-07 | Discharge: 2024-05-08 | DRG: 195 | Disposition: A | Discharge status: Home / Self Care | Attending: Internal Medicine | Admitting: Internal Medicine

## 2024-05-07 ENCOUNTER — Encounter: Payer: Self-pay | Admitting: Family Medicine

## 2024-05-07 ENCOUNTER — Ambulatory Visit (HOSPITAL_COMMUNITY)
Admission: RE | Admit: 2024-05-07 | Source: Ambulatory Visit | Attending: Internal Medicine | Admitting: Internal Medicine

## 2024-05-07 ENCOUNTER — Other Ambulatory Visit: Payer: Self-pay

## 2024-05-07 ENCOUNTER — Emergency Department (HOSPITAL_COMMUNITY)

## 2024-05-07 DIAGNOSIS — E785 Hyperlipidemia, unspecified: Secondary | ICD-10-CM | POA: Diagnosis present

## 2024-05-07 DIAGNOSIS — I4891 Unspecified atrial fibrillation: Secondary | ICD-10-CM | POA: Diagnosis present

## 2024-05-07 DIAGNOSIS — M4800 Spinal stenosis, site unspecified: Secondary | ICD-10-CM | POA: Diagnosis present

## 2024-05-07 DIAGNOSIS — D638 Anemia in other chronic diseases classified elsewhere: Secondary | ICD-10-CM | POA: Diagnosis present

## 2024-05-07 DIAGNOSIS — Z85820 Personal history of malignant melanoma of skin: Secondary | ICD-10-CM

## 2024-05-07 DIAGNOSIS — F325 Major depressive disorder, single episode, in full remission: Secondary | ICD-10-CM | POA: Diagnosis present

## 2024-05-07 DIAGNOSIS — A419 Sepsis, unspecified organism: Secondary | ICD-10-CM | POA: Diagnosis not present

## 2024-05-07 DIAGNOSIS — I2510 Atherosclerotic heart disease of native coronary artery without angina pectoris: Secondary | ICD-10-CM | POA: Diagnosis present

## 2024-05-07 DIAGNOSIS — Z888 Allergy status to other drugs, medicaments and biological substances status: Secondary | ICD-10-CM

## 2024-05-07 DIAGNOSIS — Z87891 Personal history of nicotine dependence: Secondary | ICD-10-CM

## 2024-05-07 DIAGNOSIS — K219 Gastro-esophageal reflux disease without esophagitis: Secondary | ICD-10-CM | POA: Diagnosis not present

## 2024-05-07 DIAGNOSIS — Z1152 Encounter for screening for COVID-19: Secondary | ICD-10-CM | POA: Diagnosis not present

## 2024-05-07 DIAGNOSIS — J189 Pneumonia, unspecified organism: Secondary | ICD-10-CM | POA: Diagnosis not present

## 2024-05-07 DIAGNOSIS — Z860100 Personal history of colon polyps, unspecified: Secondary | ICD-10-CM | POA: Diagnosis not present

## 2024-05-07 DIAGNOSIS — Z7901 Long term (current) use of anticoagulants: Secondary | ICD-10-CM | POA: Diagnosis not present

## 2024-05-07 DIAGNOSIS — I152 Hypertension secondary to endocrine disorders: Secondary | ICD-10-CM | POA: Diagnosis present

## 2024-05-07 DIAGNOSIS — Z87440 Personal history of urinary (tract) infections: Secondary | ICD-10-CM

## 2024-05-07 DIAGNOSIS — Z8619 Personal history of other infectious and parasitic diseases: Secondary | ICD-10-CM

## 2024-05-07 DIAGNOSIS — G4733 Obstructive sleep apnea (adult) (pediatric): Secondary | ICD-10-CM | POA: Diagnosis present

## 2024-05-07 DIAGNOSIS — R509 Fever, unspecified: Secondary | ICD-10-CM | POA: Diagnosis present

## 2024-05-07 DIAGNOSIS — E1159 Type 2 diabetes mellitus with other circulatory complications: Secondary | ICD-10-CM | POA: Diagnosis not present

## 2024-05-07 DIAGNOSIS — R5383 Other fatigue: Secondary | ICD-10-CM | POA: Diagnosis not present

## 2024-05-07 DIAGNOSIS — H90A22 Sensorineural hearing loss, unilateral, left ear, with restricted hearing on the contralateral side: Secondary | ICD-10-CM | POA: Diagnosis not present

## 2024-05-07 DIAGNOSIS — E1169 Type 2 diabetes mellitus with other specified complication: Secondary | ICD-10-CM | POA: Diagnosis not present

## 2024-05-07 DIAGNOSIS — I10 Essential (primary) hypertension: Secondary | ICD-10-CM | POA: Diagnosis not present

## 2024-05-07 DIAGNOSIS — F339 Major depressive disorder, recurrent, unspecified: Secondary | ICD-10-CM | POA: Diagnosis present

## 2024-05-07 DIAGNOSIS — R918 Other nonspecific abnormal finding of lung field: Secondary | ICD-10-CM | POA: Diagnosis not present

## 2024-05-07 DIAGNOSIS — Z79899 Other long term (current) drug therapy: Secondary | ICD-10-CM

## 2024-05-07 DIAGNOSIS — M129 Arthropathy, unspecified: Secondary | ICD-10-CM | POA: Diagnosis not present

## 2024-05-07 LAB — I-STAT CHEM 8, ED
BUN: 19 mg/dL (ref 8–23)
Calcium, Ion: 0.97 mmol/L — ABNORMAL LOW (ref 1.15–1.40)
Chloride: 103 mmol/L (ref 98–111)
Creatinine, Ser: 1 mg/dL (ref 0.61–1.24)
Glucose, Bld: 155 mg/dL — ABNORMAL HIGH (ref 70–99)
HCT: 55 % — ABNORMAL HIGH (ref 39.0–52.0)
Hemoglobin: 18.7 g/dL — ABNORMAL HIGH (ref 13.0–17.0)
Potassium: 4.2 mmol/L (ref 3.5–5.1)
Sodium: 136 mmol/L (ref 135–145)
TCO2: 21 mmol/L — ABNORMAL LOW (ref 22–32)

## 2024-05-07 LAB — COMPREHENSIVE METABOLIC PANEL WITH GFR
ALT: 17 U/L (ref 0–44)
AST: 18 U/L (ref 15–41)
Albumin: 3.7 g/dL (ref 3.5–5.0)
Alkaline Phosphatase: 48 U/L (ref 38–126)
Anion gap: 13 (ref 5–15)
BUN: 16 mg/dL (ref 8–23)
CO2: 19 mmol/L — ABNORMAL LOW (ref 22–32)
Calcium: 8.6 mg/dL — ABNORMAL LOW (ref 8.9–10.3)
Chloride: 103 mmol/L (ref 98–111)
Creatinine, Ser: 1.09 mg/dL (ref 0.61–1.24)
GFR, Estimated: >60 mL/min (ref >60)
Glucose, Bld: 146 mg/dL — ABNORMAL HIGH (ref 70–99)
Potassium: 4.3 mmol/L (ref 3.5–5.1)
Sodium: 135 mmol/L (ref 135–145)
Total Bilirubin: 1.8 mg/dL — ABNORMAL HIGH (ref 0.0–1.2)
Total Protein: 7 g/dL (ref 6.5–8.1)

## 2024-05-07 LAB — URINALYSIS, W/ REFLEX TO CULTURE (INFECTION SUSPECTED)
Bilirubin Urine: NEGATIVE
Glucose, UA: 50 mg/dL — AB
Ketones, ur: NEGATIVE mg/dL
Leukocytes,Ua: NEGATIVE
Nitrite: NEGATIVE
Protein, ur: 30 mg/dL — AB
Specific Gravity, Urine: 1.019 (ref 1.005–1.030)
pH: 6 (ref 5.0–8.0)

## 2024-05-07 LAB — CBC WITH DIFFERENTIAL/PLATELET
Abs Immature Granulocytes: 0.06 K/uL (ref 0.00–0.07)
Basophils Absolute: 0.1 K/uL (ref 0.0–0.1)
Basophils Relative: 0 %
Eosinophils Absolute: 0.3 K/uL (ref 0.0–0.5)
Eosinophils Relative: 2 %
HCT: 56.2 % — ABNORMAL HIGH (ref 39.0–52.0)
Hemoglobin: 17.6 g/dL — ABNORMAL HIGH (ref 13.0–17.0)
Immature Granulocytes: 0 %
Lymphocytes Relative: 6 %
Lymphs Abs: 0.8 K/uL (ref 0.7–4.0)
MCH: 29.8 pg (ref 26.0–34.0)
MCHC: 31.3 g/dL (ref 30.0–36.0)
MCV: 95.3 fL (ref 80.0–100.0)
Monocytes Absolute: 1.5 K/uL — ABNORMAL HIGH (ref 0.1–1.0)
Monocytes Relative: 11 %
Neutro Abs: 10.9 K/uL — ABNORMAL HIGH (ref 1.7–7.7)
Neutrophils Relative %: 81 %
Platelets: 156 K/uL (ref 150–400)
RBC: 5.9 MIL/uL — ABNORMAL HIGH (ref 4.22–5.81)
RDW: 14.1 % (ref 11.5–15.5)
WBC: 13.6 K/uL — ABNORMAL HIGH (ref 4.0–10.5)
nRBC: 0 % (ref 0.0–0.2)

## 2024-05-07 LAB — RESP PANEL BY RT-PCR (RSV, FLU A&B, COVID)  RVPGX2
Influenza A by PCR: NEGATIVE
Influenza B by PCR: NEGATIVE
Resp Syncytial Virus by PCR: NEGATIVE
SARS Coronavirus 2 by RT PCR: NEGATIVE

## 2024-05-07 LAB — I-STAT CG4 LACTIC ACID, ED
Lactic Acid, Venous: 1.8 mmol/L (ref 0.5–1.9)
Lactic Acid, Venous: 1.9 mmol/L (ref 0.5–1.9)

## 2024-05-07 LAB — TROPONIN I (HIGH SENSITIVITY)
Troponin I (High Sensitivity): 11 ng/L (ref <18)
Troponin I (High Sensitivity): 12 ng/L (ref <18)

## 2024-05-07 LAB — PROTIME-INR
INR: 1.3 — ABNORMAL HIGH (ref 0.8–1.2)
Prothrombin Time: 16.7 s — ABNORMAL HIGH (ref 11.4–15.2)

## 2024-05-07 LAB — GLUCOSE, CAPILLARY: Glucose-Capillary: 222 mg/dL — ABNORMAL HIGH (ref 70–99)

## 2024-05-07 MED ORDER — APIXABAN 5 MG PO TABS
5.0000 mg | ORAL_TABLET | Freq: Two times a day (BID) | ORAL | Status: DC
  Administered 2024-05-07 – 2024-05-08 (×2): 5 mg via ORAL
  Filled 2024-05-07 (×2): qty 1

## 2024-05-07 MED ORDER — FLUOXETINE HCL 20 MG PO CAPS
40.0000 mg | ORAL_CAPSULE | Freq: Every day | ORAL | Status: DC
  Administered 2024-05-07 – 2024-05-08 (×2): 40 mg via ORAL
  Filled 2024-05-07 (×2): qty 2

## 2024-05-07 MED ORDER — PRAVASTATIN SODIUM 40 MG PO TABS
80.0000 mg | ORAL_TABLET | Freq: Every evening | ORAL | Status: DC
  Administered 2024-05-07: 80 mg via ORAL
  Filled 2024-05-07: qty 2

## 2024-05-07 MED ORDER — FERROUS SULFATE 325 (65 FE) MG PO TABS
325.0000 mg | ORAL_TABLET | Freq: Every day | ORAL | Status: DC
  Administered 2024-05-08: 325 mg via ORAL
  Filled 2024-05-07: qty 1

## 2024-05-07 MED ORDER — SODIUM CHLORIDE 0.9 % IV SOLN
500.0000 mg | INTRAVENOUS | Status: DC
  Administered 2024-05-07: 500 mg via INTRAVENOUS
  Filled 2024-05-07: qty 5

## 2024-05-07 MED ORDER — SODIUM CHLORIDE 0.9 % IV SOLN
1.0000 g | Freq: Once | INTRAVENOUS | Status: AC
  Administered 2024-05-07: 1 g via INTRAVENOUS
  Filled 2024-05-07: qty 10

## 2024-05-07 MED ORDER — SODIUM CHLORIDE 0.9 % IV SOLN: INTRAVENOUS | Status: DC

## 2024-05-07 MED ORDER — INSULIN ASPART 100 UNIT/ML IJ SOLN
0.0000 [IU] | Freq: Every day | INTRAMUSCULAR | Status: DC
  Administered 2024-05-07: 2 [IU] via SUBCUTANEOUS

## 2024-05-07 MED ORDER — INSULIN ASPART 100 UNIT/ML IJ SOLN: 0.0000 [IU] | Freq: Three times a day (TID) | INTRAMUSCULAR | Status: DC

## 2024-05-07 MED ORDER — ONDANSETRON 4 MG PO TBDP
4.0000 mg | ORAL_TABLET | Freq: Once | ORAL | Status: AC
  Administered 2024-05-07: 4 mg via ORAL
  Filled 2024-05-07: qty 1

## 2024-05-07 MED ORDER — METOPROLOL TARTRATE 50 MG PO TABS
50.0000 mg | ORAL_TABLET | Freq: Two times a day (BID) | ORAL | Status: DC
  Administered 2024-05-07 – 2024-05-08 (×2): 50 mg via ORAL
  Filled 2024-05-07: qty 2
  Filled 2024-05-07: qty 1

## 2024-05-07 MED ORDER — SODIUM CHLORIDE 0.9 % IV SOLN
2.0000 g | INTRAVENOUS | Status: DC
  Administered 2024-05-08: 2 g via INTRAVENOUS
  Filled 2024-05-07: qty 20

## 2024-05-07 MED ORDER — SODIUM CHLORIDE 0.9 % IV SOLN
500.0000 mg | Freq: Once | INTRAVENOUS | Status: DC
  Filled 2024-05-07: qty 5

## 2024-05-07 MED ORDER — LOSARTAN POTASSIUM 50 MG PO TABS
25.0000 mg | ORAL_TABLET | Freq: Every day | ORAL | Status: DC
  Administered 2024-05-07: 25 mg via ORAL
  Filled 2024-05-07 (×2): qty 1

## 2024-05-07 MED ORDER — NITROFURANTOIN MONOHYD MACRO 100 MG PO CAPS
100.0000 mg | ORAL_CAPSULE | Freq: Two times a day (BID) | ORAL | Status: DC
  Filled 2024-05-07: qty 1

## 2024-05-07 MED ORDER — ONDANSETRON HCL 4 MG/2ML IJ SOLN: 4.0000 mg | Freq: Once | INTRAMUSCULAR | Status: DC

## 2024-05-07 MED ORDER — SODIUM CHLORIDE 0.9 % IV BOLUS
500.0000 mL | Freq: Once | INTRAVENOUS | Status: AC
  Administered 2024-05-07: 500 mL via INTRAVENOUS

## 2024-05-07 NOTE — Telephone Encounter (Signed)
 I appreciate the update. If he is still symptoms then I recommend we switch his antibiotic.  Please send a prescription for Keflex  500 mg 4 times daily for 7 days.  He should let us  know if not improving.

## 2024-05-07 NOTE — H&P (Signed)
 History and Physical    Patient: Brent Taylor FMW:[id number] DOB: 31-Mar-1941 DOA: 05/07/2024 DOS: the patient was seen and examined on 05/07/2024 PCP: [name], MD  Patient coming from: Home  Chief Complaint:  Chief Complaint  Patient presents with   Fatigue   HPI: Brent Taylor is a 83 y.o. male with medical history significant of type 2 diabetes, anemia of chronic disease, atrial fibrillation, history of melanoma, coronary artery disease, depression, GERD, hyperlipidemia, essential hypertension, obstructive sleep apnea who presents with fatigue and weakness.  Patient was recently on treatment for UTI.  He has been placed on nitrofurantoin .  He started having fever and cough last night.  His temperature was 100.4.  The cough was nonproductive.  He is not requiring oxygen  but felt weak.  Denied any nausea vomiting or diarrhea.  Patient came to the ER where he was seen and evaluated.  Patient was supposed to have some outpatient study but this was canceled due to the symptoms.  In the ER he was evaluated. He had leukocytosis with a Shuttleworth count of 13.6.  Chest x-ray showed pneumonia and patient being admitted for further evaluation and treatment of pneumonia.  Review of Systems: As mentioned in the history of present illness. All other systems reviewed and are negative. Past Medical History:  Diagnosis Date   Atrial fibrillation (HCC)    Cancer (HCC)    hx - melanoma   Chicken pox    Colon polyps    Coronary artery disease    Depression    No need for therapy.  Improved   Diabetes (HCC)    Diverticula, colon    Duodenal nodule    Dyspnea    with exertion   GERD (gastroesophageal reflux disease)    Hemorrhoids    Hyperlipidemia    Hypertension    Sepsis (HCC)    Sepsis (HCC)    Sleep apnea    Tinnitus    UTI (lower urinary tract infection)    Past Surgical History:  Procedure Laterality Date   CARDIAC CATHETERIZATION     CARDIOVERSION N/A 11/11/2023   Procedure:  CARDIOVERSION;  Surgeon: Shlomo Wilbert SAUNDERS, MD;  Location: [facility] INVASIVE CV LAB;  Service: Cardiovascular;  Laterality: N/A;   ESOPHAGOGASTRODUODENOSCOPY N/A 02/05/2023   Procedure: ESOPHAGOGASTRODUODENOSCOPY (EGD);  Surgeon: Eda Iha, MD;  Location: [facility] ENDOSCOPY;  Service: Gastroenterology;  Laterality: N/A;   FOREIGN BODY REMOVAL  02/05/2023   Procedure: FOREIGN BODY REMOVAL;  Surgeon: Eda Iha, MD;  Location: [facility] ENDOSCOPY;  Service: Gastroenterology;;   LUMBAR LAMINECTOMY/DECOMPRESSION MICRODISCECTOMY N/A 11/08/2022   Procedure: LUMBAR FOUR FIVE DECOMPRESSION AND IN SITU FUSION;  Surgeon: Burnetta Aures, MD;  Location: [facility] OR;  Service: Orthopedics;  Laterality: N/A;    MELANOMA EXCISION     LEFT CHEST   MELANOMA EXCISION     Social History:  reports that he has quit smoking. His smoking use included cigarettes. He has never used smokeless tobacco. He reports that he does not drink alcohol and does not use drugs.  Allergies  Allergen Reactions   Gabapentin  Other (See Comments)    Lethargic and depressed, aching   Lipitor [Atorvastatin Calcium ]     Muscle ache    Family History  Problem Relation Age of Onset   Heart Problems Mother        Skipping   Colon cancer Neg Hx    Esophageal cancer Neg Hx    Rectal cancer Neg Hx    Prostate cancer Neg Hx  Prior to Admission medications   Medication Sig Start Date End Date Taking? Authorizing Provider  acetaminophen  (TYLENOL ) 650 MG CR tablet Take 650-1,300 mg by mouth daily as needed for pain.   Yes [provider]  cyanocobalamin  (VITAMIN B12) 1000 MCG tablet Take 1,000 mcg by mouth once a week.   Yes [provider]  ELIQUIS  5 MG TABS tablet TAKE 1 TABLET BY MOUTH 2 TIMES A DAY 11/10/23  Yes [name], [name], MD  ferrous sulfate  325 (65 FE) MG EC tablet Take 325 mg by mouth daily with breakfast.   Yes [provider]  FLUoxetine  (PROZAC ) 40 MG capsule TAKE 1 CAPSULE BY MOUTH DAILY  07/06/23  Yes [name], [name], MD  fluticasone (FLONASE) 50 MCG/ACT nasal spray Place 1 spray into both nostrils daily as needed for allergies or rhinitis.   Yes [provider]  furosemide  (LASIX ) 20 MG tablet Take 1 tablet (20 mg total) by mouth daily. 01/27/24  Yes Fenton, [name], PA  losartan  (COZAAR ) 25 MG tablet Take 1 tablet (25 mg total) by mouth daily. Patient taking differently: Take 25 mg by mouth every evening. 04/25/24  Yes [name], [name], MD  metoprolol  tartrate (LOPRESSOR ) 50 MG tablet Take 1 tablet (50 mg total) by mouth 2 (two) times daily. 04/25/24  Yes [name], [name], MD  nitrofurantoin , macrocrystal-monohydrate, (MACROBID ) 100 MG capsule Take 1 capsule (100 mg total) by mouth 2 (two) times daily. 04/30/24  Yes [name], MD  pantoprazole  (PROTONIX ) 40 MG tablet Take 40 mg by mouth daily. 12/13/23  Yes [provider]  pravastatin  (PRAVACHOL ) 80 MG tablet TAKE ONE TABLET BY MOUTH EVERY EVENING 10/20/23  Yes [name], [name], MD  tadalafil  (CIALIS ) 20 MG tablet Take 1 tablet (20 mg total) by mouth every other day as needed for erectile dysfunction. 11/01/23  Yes [name], MD  traMADol  (ULTRAM ) 50 MG tablet Take 50 mg by mouth daily as needed for moderate pain or severe pain. 11/18/22  Yes [provider]  Blood Glucose Monitoring Suppl (ONE TOUCH ULTRA 2) w/Device KIT Use daily to check blood sugar. 10/13/20   [name], MD  Continuous Glucose Sensor (FREESTYLE LIBRE 3 SENSOR) MISC Place 1 sensor on the skin Q14 days. Use to check glucose continuously Code E11.6 12/27/23   [name], MD  Lancets [facility] ULTRASOFT) lancets USE TO TEST BLOOD SUGAR DAILY 02/01/23   [name], MD  [facility] ULTRA TEST test strip USE TO TEST BLOOD SUGAR AS DIRECTED 04/26/23   [name], MD    Physical Exam: Vitals:   05/07/24 1823 05/07/24 1830 05/07/24 2055 05/07/24 2214  BP: 139/83 (!) 139/90  127/86  Pulse: 68 96  (!) 110   Resp: 20 (!) 26  20  Temp:    98.3 F (36.8 C)  TempSrc:      SpO2: 94% 94% 94% 95%  Weight:      Height:       Constitutional: NAD, calm, comfortable Eyes: PERRL, lids and conjunctivae normal ENMT: Mucous membranes are moist. Posterior pharynx clear of any exudate or lesions.Normal dentition.  Neck: normal, supple, no masses, no thyromegaly Respiratory: Coarse BS, no wheezing, no crackles. Normal respiratory effort. No accessory muscle use.  Cardiovascular: Regular rate and rhythm, no murmurs / rubs / gallops. No extremity edema. 2+ pedal pulses. No carotid bruits.  Abdomen: no tenderness, no masses palpated. No hepatosplenomegaly. Bowel sounds positive.  Musculoskeletal: Good range of motion,  no joint swelling or tenderness, Skin: no rashes, lesions, ulcers. No induration Neurologic: CN 2-12 grossly intact. Sensation intact, DTR normal. Strength 5/5 in all 4.  Psychiatric: Normal judgment and insight. Alert and oriented x 3. Normal mood  Data Reviewed:  HR 110, BP 152/85, 13.6 18.7. Acute Viral screen negative. UA negative, CXR shows LLL infiltrates.   Assessment and Plan:  #1 community-acquired pneumonia: Patient will be admitted.  Initiate Rocephin  and Zithromax .  Blood cultures have been obtained.  Observe patient's response and adjust antibiotics as needed.  #2 coronary artery disease: Stable.  Continue home regimen  #3 hyperlipidemia: Continue statin  #4 essential hypertension: Continue blood pressure medications.  #5 type 2 diabetes: Will continue with sliding scale insulin .  #6 recent UTI: UA seems clear.  Was on Macrobid  which we will discontinue.  He is currently on Rocephin  for the pneumonia.  No cultures available  #7 sensorineural hearing loss: Stable at baseline  #8 GERD: Continues PPIs  #9 major depressive disorder: Continue home regimen    Advance Care Planning:   Code Status: Full Code   Consults: None  Family Communication: No family at  bedside  Severity of Illness: The appropriate patient status for this patient is INPATIENT. Inpatient status is judged to be reasonable and necessary in order to provide the required intensity of service to ensure the patient's safety. The patient's presenting symptoms, physical exam findings, and initial radiographic and laboratory data in the context of their chronic comorbidities is felt to place them at high risk for further clinical deterioration. Furthermore, it is not anticipated that the patient will be medically stable for discharge from the hospital within 2 midnights of admission.   * I certify that at the point of admission it is my clinical judgment that the patient will require inpatient hospital care spanning beyond 2 midnights from the point of admission due to high intensity of service, high risk for further deterioration and high frequency of surveillance required.*  AuthorBETHA SIM KNOLL, MD 05/07/2024 10:19 PM  For on call review [email or web address].

## 2024-05-07 NOTE — ED Triage Notes (Signed)
 Pt c/o of full body fatigue, tested positive for a UTI x1 week and prescribed antibiotics, hx afib, had a fever last night, states he feels a little nauseous and some diarrhea. Denies SOB, abdominal pain.

## 2024-05-07 NOTE — ED Provider Notes (Signed)
 [place] EMERGENCY DEPARTMENT AT [facility] Provider Note   CSN: [id number] Arrival date & time: 05/07/24  [id number]     Patient presents with: Fatigue   Brent Taylor is a 83 y.o. male hx of diabetes, anemia here presenting with cough and fever.  Patient was diagnosed with UTI about a week ago.  Patient is on his second to last dose of Macrobid .  However last night he was running a fever.  He states that his temperature was 100.4.  Patient also has been having nonproductive cough.  Denies any abdominal pain or vomiting.  Patient called his doctor today and was sent for further evaluation.  Of note, patient had an outpatient mild perfusion study that was canceled.   The history is provided by the patient.       Prior to Admission medications   Medication Sig Start Date End Date Taking? Authorizing Provider  acetaminophen  (TYLENOL ) 650 MG CR tablet Take 650-1,300 mg by mouth daily as needed for pain.    [provider]  Bacillus Coagulans-Inulin (ALIGN PREBIOTIC-PROBIOTIC PO) Take by mouth.    [provider]  Blood Glucose Monitoring Suppl (ONE TOUCH ULTRA 2) w/Device KIT Use daily to check blood sugar. 10/13/20   [name], MD  Continuous Glucose Sensor (FREESTYLE LIBRE 3 SENSOR) MISC Place 1 sensor on the skin Q14 days. Use to check glucose continuously Code E11.6 12/27/23   [name], MD  cyanocobalamin  (VITAMIN B12) 1000 MCG tablet Take 1,000 mcg by mouth once a week.    [provider]  ELIQUIS  5 MG TABS tablet TAKE 1 TABLET BY MOUTH 2 TIMES A DAY 11/10/23   [name], [name], MD  ferrous sulfate  325 (65 FE) MG EC tablet Take 325 mg by mouth daily with breakfast.    [provider]  FLUoxetine  (PROZAC ) 40 MG capsule TAKE 1 CAPSULE BY MOUTH DAILY 07/06/23   [name], [name], MD  fluticasone [facility]) 50 MCG/ACT nasal spray Place 1 spray into both nostrils daily as needed for allergies or rhinitis.    [provider]   furosemide  (LASIX ) 20 MG tablet Take 1 tablet (20 mg total) by mouth daily. 01/27/24   Fenton, [name], PA  Lancets (ONETOUCH ULTRASOFT) lancets USE TO TEST BLOOD SUGAR DAILY 02/01/23   [name], MD  losartan  (COZAAR ) 25 MG tablet Take 1 tablet (25 mg total) by mouth daily. 04/25/24   [name], MD  metoprolol  tartrate (LOPRESSOR ) 50 MG tablet Take 1 tablet (50 mg total) by mouth 2 (two) times daily. 04/25/24   [name], [name], MD  nitrofurantoin , macrocrystal-monohydrate, (MACROBID ) 100 MG capsule Take 1 capsule (100 mg total) by mouth 2 (two) times daily. 04/30/24   [name], MD  [facility] ULTRA TEST test strip USE TO TEST BLOOD SUGAR AS DIRECTED 04/26/23   [name], MD  pantoprazole  (PROTONIX ) 40 MG tablet Take 40 mg by mouth daily. Patient not taking: Reported on 04/30/2024 12/13/23   [provider]  pravastatin  (PRAVACHOL ) 80 MG tablet TAKE ONE TABLET BY MOUTH EVERY EVENING Patient not taking: Reported on 04/30/2024 10/20/23   [name], MD  tadalafil  (CIALIS ) 20 MG tablet Take 1 tablet (20 mg total) by mouth every other day as needed for erectile dysfunction. 11/01/23   [name], MD  traMADol  (ULTRAM ) 50 MG tablet Take 50 mg by mouth daily as needed for moderate pain or severe pain. 11/18/22   [provider]  Allergies: Gabapentin  and Lipitor [atorvastatin calcium ]    Review of Systems  Constitutional:  Positive for fever.  Respiratory:  Positive for cough.   All other systems reviewed and are negative.   Updated Vital Signs BP (!) 152/85   Pulse 89   Temp 99.3 F (37.4 C) (Oral)   Resp (!) 22   Ht 6' (1.829 m)   Wt 108.9 kg   SpO2 95%   BMI 32.55 kg/m   Physical Exam Vitals and nursing note reviewed.  HENT:     Head: Normocephalic.     Mouth/Throat:     Mouth: Mucous membranes are moist.  Eyes:     Extraocular Movements: Extraocular movements intact.     Pupils: Pupils are equal, round, and  reactive to light.  Cardiovascular:     Rate and Rhythm: Normal rate and regular rhythm.     Pulses: Normal pulses.  Pulmonary:     Effort: Pulmonary effort is normal.     Comments: Crackles on right base  Abdominal:     General: Abdomen is flat.     Palpations: Abdomen is soft.  Musculoskeletal:        General: Normal range of motion.     Cervical back: Normal range of motion.  Skin:    General: Skin is warm.     Capillary Refill: Capillary refill takes less than 2 seconds.  Neurological:     General: No focal deficit present.     Mental Status: He is alert and oriented to person, place, and time.  Psychiatric:        Mood and Affect: Mood normal.        Behavior: Behavior normal.     (all labs ordered are listed, but only abnormal results are displayed) Labs Reviewed  COMPREHENSIVE METABOLIC PANEL WITH GFR - Abnormal; Notable for the following components:      Result Value   CO2 19 (*)    Glucose, Bld 146 (*)    Calcium  8.6 (*)    Total Bilirubin 1.8 (*)    All other components within normal limits  CBC WITH DIFFERENTIAL/PLATELET - Abnormal; Notable for the following components:   WBC 13.6 (*)    RBC 5.90 (*)    Hemoglobin 17.6 (*)    HCT 56.2 (*)    Neutro Abs 10.9 (*)    Monocytes Absolute 1.5 (*)    All other components within normal limits  URINALYSIS, W/ REFLEX TO CULTURE (INFECTION SUSPECTED) - Abnormal; Notable for the following components:   APPearance HAZY (*)    Glucose, UA 50 (*)    Hgb urine dipstick SMALL (*)    Protein, ur 30 (*)    Bacteria, UA RARE (*)    All other components within normal limits  I-STAT CHEM 8, ED - Abnormal; Notable for the following components:   Glucose, Bld 155 (*)    Calcium , Ion 0.97 (*)    TCO2 21 (*)    Hemoglobin 18.7 (*)    HCT 55.0 (*)    All other components within normal limits  RESP PANEL BY RT-PCR (RSV, FLU A&B, COVID)  RVPGX2  CULTURE, BLOOD (ROUTINE X 2)  CULTURE, BLOOD (ROUTINE X 2)  PROTIME-INR  I-STAT  CG4 LACTIC ACID, ED  I-STAT CG4 LACTIC ACID, ED  TROPONIN I (HIGH SENSITIVITY)  TROPONIN I (HIGH SENSITIVITY)    EKG: None  Radiology: DG Chest Port 1 View Result Date: 05/07/2024 CLINICAL DATA:  Sepsis, fatigue EXAM: PORTABLE CHEST 1  VIEW COMPARISON:  07/08/2023 FINDINGS: Left retrocardiac density with central lucency compatible with known hiatal hernia. Left lower lobe airspace opacity in the retro diaphragmatic region and along the margins of the hiatal hernia potentially from atelectasis or pneumonia. Thoracic spondylosis noted.  Heart size within normal limits. Degenerative glenohumeral arthropathy on the left. IMPRESSION: 1. Left lower lobe airspace opacity in the retro diaphragmatic region and along the margins of the hiatal hernia potentially from atelectasis or pneumonia. 2. Thoracic spondylosis. 3. Degenerative glenohumeral arthropathy on the left. Electronically Signed   By: Ryan Salvage M.D.   On: 05/07/2024 17:11     Procedures   Medications Ordered in the ED  cefTRIAXone  (ROCEPHIN ) 1 g in sodium chloride  0.9 % 100 mL IVPB (has no administration in time range)  azithromycin  (ZITHROMAX ) 500 mg in sodium chloride  0.9 % 250 mL IVPB (has no administration in time range)  ondansetron  (ZOFRAN -ODT) disintegrating tablet 4 mg (4 mg Oral Given 05/07/24 1654)  sodium chloride  0.9 % bolus 500 mL (500 mLs Intravenous New Bag/Given 05/07/24 1819)                                    Medical Decision Making Brent Taylor is a 83 y.o. male here presenting with cough and fever.  Patient is already Macrobid  for UTI.  Unfortunately he developed fever yesterday and has nonproductive cough.  Concern for possible pneumonia versus sepsis.  Plan to get CBC and CMP and lactate and culture and chest x-ray and repeat urinalysis  6:42 PM \\I  reviewed patient's lab and Solana blood cell count is elevated at 13.6.  Lactate is normal.  Chest x-ray showed pneumonia on the right side.  Patient was given  Rocephin  and azithromycin .  Blood cultures were sent.  Hospitalist to admit for pneumonia    Problems Addressed: Community acquired pneumonia of right lower lobe of lung: acute illness or injury  Amount and/or Complexity of Data Reviewed Labs: ordered. Decision-making details documented in ED Course. Radiology: ordered and independent interpretation performed. Decision-making details documented in ED Course.  Risk Decision regarding hospitalization.     Final diagnoses:  None    ED Discharge Orders     None          Patt Alm Macho, MD 05/07/24 [phone]

## 2024-05-07 NOTE — Telephone Encounter (Signed)
 Patient called in this am concerned about having a UTI. He was running a fever last night. He is now rescheduled for July 31, at 10:45. Brent Taylor CCT

## 2024-05-07 NOTE — ED Provider Triage Note (Signed)
 Emergency Medicine Provider Triage Evaluation Note  Brent Taylor , a 83 y.o. male  was evaluated in triage.  Pt complains of not feeling well for the past week.  He was diagnosed with UTI about a week ago and has completed the course of his antibiotics.  Still reports nausea, fever of 100.5 last night..  Review of Systems  Positive: As above Negative: As above  Physical Exam  BP (!) 152/85   Pulse 97   Temp 98.4 F (36.9 C)   Resp (!) 22   Ht 6' (1.829 m)   Wt 108.9 kg   SpO2 92%   BMI 32.55 kg/m  Gen:   Awake, no distress   Resp:  Normal effort  MSK:   Moves extremities without difficulty  Other:    Medical Decision Making  Medically screening exam initiated at 4:30 PM.  Appropriate orders placed.  Brent Taylor was informed that the remainder of the evaluation will be completed by another provider, this initial triage assessment does not replace that evaluation, and the importance of remaining in the ED until their evaluation is complete.  Unknown source, febrile at home, tachycardic.  Will order sepsis labs.   Hildegard Loge, PA-C 05/07/24 1630

## 2024-05-07 NOTE — Telephone Encounter (Signed)
 See note

## 2024-05-07 NOTE — Progress Notes (Signed)
 Blanket clothing, glasses, cell phone charger, cell phone.

## 2024-05-07 NOTE — Plan of Care (Signed)

## 2024-05-08 ENCOUNTER — Other Ambulatory Visit: Payer: Self-pay | Admitting: *Deleted

## 2024-05-08 DIAGNOSIS — E1169 Type 2 diabetes mellitus with other specified complication: Secondary | ICD-10-CM | POA: Diagnosis not present

## 2024-05-08 DIAGNOSIS — E785 Hyperlipidemia, unspecified: Secondary | ICD-10-CM

## 2024-05-08 DIAGNOSIS — K219 Gastro-esophageal reflux disease without esophagitis: Secondary | ICD-10-CM | POA: Diagnosis not present

## 2024-05-08 DIAGNOSIS — I2510 Atherosclerotic heart disease of native coronary artery without angina pectoris: Secondary | ICD-10-CM

## 2024-05-08 DIAGNOSIS — G4733 Obstructive sleep apnea (adult) (pediatric): Secondary | ICD-10-CM

## 2024-05-08 DIAGNOSIS — J189 Pneumonia, unspecified organism: Secondary | ICD-10-CM | POA: Diagnosis not present

## 2024-05-08 LAB — CBC
HCT: 52.8 % — ABNORMAL HIGH (ref 39.0–52.0)
Hemoglobin: 16.6 g/dL (ref 13.0–17.0)
MCH: 29.6 pg (ref 26.0–34.0)
MCHC: 31.4 g/dL (ref 30.0–36.0)
MCV: 94.3 fL (ref 80.0–100.0)
Platelets: 160 K/uL (ref 150–400)
RBC: 5.6 MIL/uL (ref 4.22–5.81)
RDW: 14.1 % (ref 11.5–15.5)
WBC: 10.3 K/uL (ref 4.0–10.5)
nRBC: 0 % (ref 0.0–0.2)

## 2024-05-08 LAB — COMPREHENSIVE METABOLIC PANEL WITH GFR
ALT: 15 U/L (ref 0–44)
AST: 17 U/L (ref 15–41)
Albumin: 3.4 g/dL — ABNORMAL LOW (ref 3.5–5.0)
Alkaline Phosphatase: 41 U/L (ref 38–126)
Anion gap: 9 (ref 5–15)
BUN: 15 mg/dL (ref 8–23)
CO2: 24 mmol/L (ref 22–32)
Calcium: 8.4 mg/dL — ABNORMAL LOW (ref 8.9–10.3)
Chloride: 102 mmol/L (ref 98–111)
Creatinine, Ser: 1.11 mg/dL (ref 0.61–1.24)
GFR, Estimated: >60 mL/min (ref >60)
Glucose, Bld: 132 mg/dL — ABNORMAL HIGH (ref 70–99)
Potassium: 4.5 mmol/L (ref 3.5–5.1)
Sodium: 135 mmol/L (ref 135–145)
Total Bilirubin: 1.1 mg/dL (ref 0.0–1.2)
Total Protein: 6.4 g/dL — ABNORMAL LOW (ref 6.5–8.1)

## 2024-05-08 LAB — GLUCOSE, CAPILLARY: Glucose-Capillary: 140 mg/dL — ABNORMAL HIGH (ref 70–99)

## 2024-05-08 LAB — HIV ANTIBODY (ROUTINE TESTING W REFLEX): HIV Screen 4th Generation wRfx: NONREACTIVE

## 2024-05-08 MED ORDER — ALBUTEROL SULFATE HFA 108 (90 BASE) MCG/ACT IN AERS: 2.0000 | INHALATION_SPRAY | Freq: Four times a day (QID) | RESPIRATORY_TRACT | 2 refills | Status: DC | PRN

## 2024-05-08 MED ORDER — AZITHROMYCIN 500 MG PO TABS: 500.0000 mg | ORAL_TABLET | Freq: Every day | ORAL | 0 refills | Status: AC

## 2024-05-08 MED ORDER — AMOXICILLIN-POT CLAVULANATE 875-125 MG PO TABS: 1.0000 | ORAL_TABLET | Freq: Two times a day (BID) | ORAL | 0 refills | Status: DC

## 2024-05-08 NOTE — Progress Notes (Signed)
 Discharge instructions completed with pt and wife.  Copy given.  20g IV removed from right antecubital with tip intact.  All questions answered.  Awaiting on transport with wheelchair.

## 2024-05-08 NOTE — Telephone Encounter (Signed)
 Patient present in hospital

## 2024-05-08 NOTE — Progress Notes (Signed)
 Transition of Care [facility]) - Inpatient Brief Assessment   Patient Details  Name: Brent Taylor MRN: [id number] Date of Birth: [birth year]  Transition of Care [facility]) CM/SW Contact:    Rosaline JONELLE Joe, RN Phone Number: 05/08/2024, 11:25 AM   Clinical Narrative: Patient admitted from home with pneumonia.   No IP Care management needs at this time.  Patient is being discharged home with family today.   Transition of Care Asessment: Insurance and Status: (P) Insurance coverage has been reviewed Patient has primary care physician: (P) Yes Home environment has been reviewed: (P) from home with spouse Prior level of function:: (P) self Prior/Current Home Services: (P) No current home services Social Drivers of Health Review: (P) SDOH reviewed no interventions necessary Readmission risk has been reviewed: (P) Yes Transition of care needs: (P) no transition of care needs at this time

## 2024-05-08 NOTE — Progress Notes (Signed)
 Pt taken outside to private vehicle via wheelchair.  Discharged home in stable condition with all belongings.

## 2024-05-08 NOTE — Plan of Care (Signed)
  Problem: Education: Goal: Knowledge of General Education information will improve Description: Including pain rating scale, medication(s)/side effects and non-pharmacologic comfort measures Outcome: Adequate for Discharge   Problem: Health Behavior/Discharge Planning: Goal: Ability to manage health-related needs will improve Outcome: Adequate for Discharge   Problem: Clinical Measurements: Goal: Ability to maintain clinical measurements within normal limits will improve Outcome: Adequate for Discharge Goal: Will remain free from infection Outcome: Adequate for Discharge Goal: Diagnostic test results will improve Outcome: Adequate for Discharge Goal: Respiratory complications will improve Outcome: Adequate for Discharge Goal: Cardiovascular complication will be avoided Outcome: Adequate for Discharge   Problem: Activity: Goal: Risk for activity intolerance will decrease Outcome: Adequate for Discharge   Problem: Nutrition: Goal: Adequate nutrition will be maintained Outcome: Adequate for Discharge   Problem: Coping: Goal: Level of anxiety will decrease Outcome: Adequate for Discharge   Problem: Elimination: Goal: Will not experience complications related to bowel motility Outcome: Adequate for Discharge Goal: Will not experience complications related to urinary retention Outcome: Adequate for Discharge   Problem: Pain Managment: Goal: General experience of comfort will improve and/or be controlled Outcome: Adequate for Discharge   Problem: Safety: Goal: Ability to remain free from injury will improve Outcome: Adequate for Discharge   Problem: Skin Integrity: Goal: Risk for impaired skin integrity will decrease Outcome: Adequate for Discharge   Problem: Activity: Goal: Ability to tolerate increased activity will improve Outcome: Adequate for Discharge   Problem: Clinical Measurements: Goal: Ability to maintain a body temperature in the normal range will  improve Outcome: Adequate for Discharge   Problem: Respiratory: Goal: Ability to maintain adequate ventilation will improve Outcome: Adequate for Discharge Goal: Ability to maintain a clear airway will improve Outcome: Adequate for Discharge   Problem: Education: Goal: Ability to describe self-care measures that may prevent or decrease complications (Diabetes Survival Skills Education) will improve Outcome: Adequate for Discharge Goal: Individualized Educational Video(s) Outcome: Adequate for Discharge   Problem: Coping: Goal: Ability to adjust to condition or change in health will improve Outcome: Adequate for Discharge   Problem: Fluid Volume: Goal: Ability to maintain a balanced intake and output will improve Outcome: Adequate for Discharge   Problem: Health Behavior/Discharge Planning: Goal: Ability to identify and utilize available resources and services will improve Outcome: Adequate for Discharge Goal: Ability to manage health-related needs will improve Outcome: Adequate for Discharge   Problem: Metabolic: Goal: Ability to maintain appropriate glucose levels will improve Outcome: Adequate for Discharge   Problem: Nutritional: Goal: Maintenance of adequate nutrition will improve Outcome: Adequate for Discharge Goal: Progress toward achieving an optimal weight will improve Outcome: Adequate for Discharge   Problem: Skin Integrity: Goal: Risk for impaired skin integrity will decrease Outcome: Adequate for Discharge   Problem: Tissue Perfusion: Goal: Adequacy of tissue perfusion will improve Outcome: Adequate for Discharge

## 2024-05-08 NOTE — Discharge Summary (Signed)
 Physician Discharge Summary   Patient: Brent Taylor MRN: [id number] DOB: 1940/12/25  Admit date:     05/07/2024  Discharge date: 05/08/24  Discharge Physician: Carliss LELON Canales   PCP: [name], MD   Recommendations at discharge:    Pt to be discharged home.   If you experience worsening fever, chills, chest pain, shortness of breath, or other concerning symptoms, please call your PCP or go to the emergency department immediately.  Discharge Diagnoses: Principal Problem:   Pneumonia Active Problems:   Coronary artery disease   Dyslipidemia associated with type 2 diabetes mellitus (HCC)   Major depression in remission (HCC)   GERD (gastroesophageal reflux disease)   Hypertension associated with diabetes (HCC)   Type 2 diabetes mellitus with vascular disease (HCC)   Sensorineural hearing loss (SNHL) of left ear with restricted hearing of right ear   OSA (obstructive sleep apnea)   Spinal stenosis  Resolved Problems:   * No resolved hospital problems. *   Hospital Course:  83 y.o. male with medical history significant of type 2 diabetes, anemia of chronic disease, atrial fibrillation, history of melanoma, coronary artery disease, depression, GERD, hyperlipidemia, essential hypertension, obstructive sleep apnea who presents with fatigue and weakness.  Patient was recently on treatment for UTI.  He has been placed on nitrofurantoin .  He started having fever and cough last night.  His temperature was 100.4.  The cough was nonproductive.  He is not requiring oxygen  but felt weak.  Denied any nausea vomiting or diarrhea.  Patient came to the ER where he was seen and evaluated.  Patient was supposed to have some outpatient study but this was canceled due to the symptoms.  In the ER he was evaluated. He had leukocytosis with a Petties count of 13.6.  Chest x-ray showed pneumonia and patient being admitted for further evaluation and treatment of pneumonia.  Assessment and Plan:  Commune  acquired pneumonia - Cough, fever, leukocytosis on presentation with chest x-ray showing possible left-sided consolidation.  Initiated on empiric ceftriaxone  plus azithromycin .  Nebulizers.  Showed marked improvement overnight.  Patient no longer short of breath, breathing well, leukocytosis resolved.  Will transition patient to p.o. Augmentin  plus azithromycin  to take as directed upon discharge.  Will also give order for albuterol  MDI.  Recommend follow-up with PCP in outpatient setting.  CAD/HTN/HLD - Resume home medication regiment.  Diabetes mellitus - Received insulin  sliding scale while inpatient.  Resume home regimen upon discharge.  Recent urinary tract infection - Previously on Macrobid .  Will discontinue upon discharge.  Antibiotics as above.   Consultants: None Procedures performed: None Disposition: Home Diet recommendation:  Discharge Diet Orders (From admission, onward)     Start     Ordered   05/08/24 0000  Diet - low sodium heart healthy        05/08/24 1055           Cardiac and Carb modified diet  DISCHARGE MEDICATION: Allergies as of 05/08/2024       Reactions   Gabapentin  Other (See Comments)   Lethargic and depressed, aching   Lipitor [atorvastatin Calcium ]    Muscle ache        Medication List     STOP taking these medications    nitrofurantoin  (macrocrystal-monohydrate) 100 MG capsule Commonly known as: Macrobid    ONE TOUCH ULTRA 2 w/Device Kit   OneTouch Ultra Test test strip Generic drug: glucose blood   onetouch ultrasoft lancets       TAKE  these medications    acetaminophen  650 MG CR tablet Commonly known as: TYLENOL  Take 650-1,300 mg by mouth daily as needed for pain.   albuterol  108 (90 Base) MCG/ACT inhaler Commonly known as: VENTOLIN  HFA Inhale 2 puffs into the lungs every 6 (six) hours as needed for wheezing or shortness of breath.   amoxicillin -clavulanate 875-125 MG tablet Commonly known as: AUGMENTIN  Take 1  tablet by mouth 2 (two) times daily.   azithromycin  500 MG tablet Commonly known as: Zithromax  Take 1 tablet (500 mg total) by mouth daily for 3 days.   cyanocobalamin  1000 MCG tablet Commonly known as: VITAMIN B12 Take 1,000 mcg by mouth once a week.   Eliquis  5 MG Tabs tablet Generic drug: apixaban  TAKE 1 TABLET BY MOUTH 2 TIMES A DAY   ferrous sulfate  325 (65 FE) MG EC tablet Take 325 mg by mouth daily with breakfast.   FLUoxetine  40 MG capsule Commonly known as: PROZAC  TAKE 1 CAPSULE BY MOUTH DAILY   fluticasone 50 MCG/ACT nasal spray Commonly known as: FLONASE Place 1 spray into both nostrils daily as needed for allergies or rhinitis.   FreeStyle Libre 3 Sensor Misc Place 1 sensor on the skin Q14 days. Use to check glucose continuously Code E11.6   furosemide  20 MG tablet Commonly known as: LASIX  Take 1 tablet (20 mg total) by mouth daily.   losartan  25 MG tablet Commonly known as: COZAAR  Take 1 tablet (25 mg total) by mouth daily. What changed: when to take this   metoprolol  tartrate 50 MG tablet Commonly known as: LOPRESSOR  Take 1 tablet (50 mg total) by mouth 2 (two) times daily.   pantoprazole  40 MG tablet Commonly known as: PROTONIX  Take 40 mg by mouth daily.   pravastatin  80 MG tablet Commonly known as: PRAVACHOL  TAKE ONE TABLET BY MOUTH EVERY EVENING   tadalafil  20 MG tablet Commonly known as: CIALIS  Take 1 tablet (20 mg total) by mouth every other day as needed for erectile dysfunction.   traMADol  50 MG tablet Commonly known as: ULTRAM  Take 50 mg by mouth daily as needed for moderate pain or severe pain.         Discharge Exam: Filed Weights   05/07/24 1627  Weight: 108.9 kg    GENERAL:  Alert, pleasant, no acute distress  HEENT:  EOMI CARDIOVASCULAR:  RRR, no murmurs appreciated RESPIRATORY:  Clear to auscultation, no wheezing, rales, or rhonchi GASTROINTESTINAL:  Soft, nontender, nondistended EXTREMITIES:  No LE edema  bilaterally NEURO:  No new focal deficits appreciated SKIN:  No rashes noted PSYCH:  Appropriate mood and affect     Condition at discharge: improving  The results of significant diagnostics from this hospitalization (including imaging, microbiology, ancillary and laboratory) are listed below for reference.   Imaging Studies: DG Chest Port 1 View Result Date: 05/07/2024 CLINICAL DATA:  Sepsis, fatigue EXAM: PORTABLE CHEST 1 VIEW COMPARISON:  07/08/2023 FINDINGS: Left retrocardiac density with central lucency compatible with known hiatal hernia. Left lower lobe airspace opacity in the retro diaphragmatic region and along the margins of the hiatal hernia potentially from atelectasis or pneumonia. Thoracic spondylosis noted.  Heart size within normal limits. Degenerative glenohumeral arthropathy on the left. IMPRESSION: 1. Left lower lobe airspace opacity in the retro diaphragmatic region and along the margins of the hiatal hernia potentially from atelectasis or pneumonia. 2. Thoracic spondylosis. 3. Degenerative glenohumeral arthropathy on the left. Electronically Signed   By: Ryan Salvage M.D.   On: 05/07/2024 17:11  Microbiology: Results for orders placed or performed during the hospital encounter of 05/07/24  Resp panel by RT-PCR (RSV, Flu A&B, Covid) Anterior Nasal Swab     Status: None   Collection Time: 05/07/24  4:37 PM   Specimen: Anterior Nasal Swab  Result Value Ref Range Status   SARS Coronavirus 2 by RT PCR NEGATIVE NEGATIVE Final   Influenza A by PCR NEGATIVE NEGATIVE Final   Influenza B by PCR NEGATIVE NEGATIVE Final    Comment: (NOTE) The Xpert Xpress SARS-CoV-2/FLU/RSV plus assay is intended as an aid in the diagnosis of influenza from Nasopharyngeal swab specimens and should not be used as a sole basis for treatment. Nasal washings and aspirates are unacceptable for Xpert Xpress SARS-CoV-2/FLU/RSV testing.  Fact Sheet for  Patients: BloggerCourse.com  Fact Sheet for Healthcare Providers: SeriousBroker.it  This test is not yet approved or cleared by the [place]  FDA and has been authorized for detection and/or diagnosis of SARS-CoV-2 by FDA under an Emergency Use Authorization (EUA). This EUA will remain in effect (meaning this test can be used) for the duration of the COVID-19 declaration under Section 564(b)(1) of the Act, 21 U.S.C. section 360bbb-3(b)(1), unless the authorization is terminated or revoked.     Resp Syncytial Virus by PCR NEGATIVE NEGATIVE Final    Comment: (NOTE) Fact Sheet for Patients: BloggerCourse.com  Fact Sheet for Healthcare Providers: SeriousBroker.it  This test is not yet approved or cleared by the [place]  FDA and has been authorized for detection and/or diagnosis of SARS-CoV-2 by FDA under an Emergency Use Authorization (EUA). This EUA will remain in effect (meaning this test can be used) for the duration of the COVID-19 declaration under Section 564(b)(1) of the Act, 21 U.S.C. section 360bbb-3(b)(1), unless the authorization is terminated or revoked.  Performed at [facility], [address], [place], [place] [address]   Blood Culture (routine x 2)     Status: None (Preliminary result)   Collection Time: 05/07/24  5:01 PM   Specimen: BLOOD RIGHT HAND  Result Value Ref Range Status   Specimen Description BLOOD RIGHT HAND  Final   Special Requests   Final    BOTTLES DRAWN AEROBIC ONLY Blood Culture results may not be optimal due to an inadequate volume of blood received in culture bottles   Culture   Final    NO GROWTH < 24 HOURS Performed at [facility], [address] [place], [place] [address]    Report Status PENDING  Incomplete  Blood Culture (routine x 2)     Status: None (Preliminary result)   Collection Time: 05/07/24  6:21 PM    Specimen: BLOOD  Result Value Ref Range Status   Specimen Description BLOOD SITE NOT SPECIFIED  Final   Special Requests   Final    BOTTLES DRAWN AEROBIC AND ANAEROBIC Blood Culture adequate volume   Culture   Final    NO GROWTH < 24 HOURS Performed at [facility], [address], [place], [place] [address]    Report Status PENDING  Incomplete    Labs: CBC: Recent Labs  Lab 05/07/24 1705 05/07/24 1714 05/08/24 0415  WBC 13.6*  --  10.3  NEUTROABS 10.9*  --   --   HGB 17.6* 18.7* 16.6  HCT 56.2* 55.0* 52.8*  MCV 95.3  --  94.3  PLT 156  --  160   Basic Metabolic Panel: Recent Labs  Lab 05/07/24 1705 05/07/24 1714 05/08/24 0415  NA  135 136 135  K 4.3 4.2 4.5  CL 103 103 102  CO2 19*  --  24  GLUCOSE 146* 155* 132*  BUN 16 19 15   CREATININE 1.09 1.00 1.11  CALCIUM  8.6*  --  8.4*   Liver Function Tests: Recent Labs  Lab 05/07/24 1705 05/08/24 0415  AST 18 17  ALT 17 15  ALKPHOS 48 41  BILITOT 1.8* 1.1  PROT 7.0 6.4*  ALBUMIN  3.7 3.4*   CBG: Recent Labs  Lab 05/07/24 2213 05/08/24 0802  GLUCAP 222* 140*    Discharge time spent: 28 minutes.  Length of inpatient stay: 1 days  Signed: Carliss LELON Canales, DO [facility] 05/08/2024

## 2024-05-09 ENCOUNTER — Telehealth: Payer: Self-pay | Admitting: *Deleted

## 2024-05-09 NOTE — Transitions of Care (Post Inpatient/ED Visit) (Signed)
 05/09/2024  Name: Brent Taylor MRN: [id number] DOB: 1941/03/09  Today's TOC FU Call Status: Today's TOC FU Call Status:: Successful TOC FU Call Completed TOC FU Call Complete Date: 05/09/24 Patient's Name and Date of Birth confirmed.  Transition Care Management Follow-up Telephone Call Date of Discharge: 05/08/24 Discharge Facility: [facility]) Type of Discharge: Inpatient Admission Primary Inpatient Discharge Diagnosis:: Pneumonia How have you been since you were released from the hospital?: Better Any questions or concerns?: No  Items Reviewed: Did you receive and understand the discharge instructions provided?: Yes Medications obtained,verified, and reconciled?: Yes (Medications Reviewed) Any new allergies since your discharge?: No Dietary orders reviewed?: Yes Type of Diet Ordered:: heart healthy,  low sodium Do you have support at home?: Yes People in Home [RPT]: spouse Name of Support/Comfort Primary Source: Brent Taylor Reviewed signs/ symptoms of infection, pneumonia  Medications Reviewed Today: Medications Reviewed Today     Reviewed by Brent Mliss LABOR, RN (Registered Nurse) on 05/09/24 at 1154  Med List Status: <None>   Medication Order Taking? Sig Documenting Provider Last Dose Status Informant  acetaminophen  (TYLENOL ) 650 MG CR tablet [id number] Yes Take 650-1,300 mg by mouth daily as needed for pain. [provider]  Active Self  albuterol  (VENTOLIN  HFA) 108 (90 Base) MCG/ACT inhaler [id number] Yes Inhale 2 puffs into the lungs every 6 (six) hours as needed for wheezing or shortness of breath. [name], DO  Active   amoxicillin -clavulanate (AUGMENTIN ) 875-125 MG tablet [id number] Yes Take 1 tablet by mouth 2 (two) times daily. [name], DO  Active   azithromycin  (ZITHROMAX ) 500 MG tablet [id number] Yes Take 1 tablet (500 mg total) by mouth daily for 3 days. [name], DO  Active   Continuous Glucose Sensor (FREESTYLE LIBRE 3 SENSOR)  Brent Taylor [id number] Yes Place 1 sensor on the skin Q14 days. Use to check glucose continuously Code E11.6 [name], MD  Active Self  cyanocobalamin  (VITAMIN B12) 1000 MCG tablet [id number] Yes Take 1,000 mcg by mouth once a week. [provider]  Active Self           Med Note (Brent Taylor, Brent Taylor May 07, 2024  9:48 PM) Take on Mondays   ELIQUIS  5 MG TABS tablet [id number] Yes TAKE 1 TABLET BY MOUTH 2 TIMES A DAY [name], [name], MD  Active Self  ferrous sulfate  325 (65 FE) MG EC tablet [id number] Yes Take 325 mg by mouth daily with breakfast. [provider]  Active Self  FLUoxetine  (PROZAC ) 40 MG capsule [id number] Yes TAKE 1 CAPSULE BY MOUTH DAILY [name], [name], MD  Active Self  fluticasone (FLONASE) 50 MCG/ACT nasal spray [id number] Yes Place 1 spray into both nostrils daily as needed for allergies or rhinitis. [provider]  Active Self           Med Note (Brent Taylor, Brent Taylor   Mon May 07, 2024 10:16 PM) Still has on hand   furosemide  (LASIX ) 20 MG tablet [id number] Yes Take 1 tablet (20 mg total) by mouth daily. Fenton, [name], [place]  Active Self  losartan  (COZAAR ) 25 MG tablet [id number] Yes Take 1 tablet (25 mg total) by mouth daily. [name], MD  Active Self  metoprolol  tartrate (LOPRESSOR ) 50 MG tablet [id number] Yes Take 1 tablet (50 mg total) by mouth 2 (two) times daily. [name], MD  Active Self  pantoprazole  (PROTONIX ) 40 MG tablet [id number] Yes Take 40 mg by mouth daily.  [provider]  Active Self  pravastatin  (PRAVACHOL ) 80 MG tablet [id number] Yes TAKE ONE TABLET BY MOUTH EVERY EVENING [name], [name], MD  Active Self  tadalafil  (CIALIS ) 20 MG tablet [id number] Yes Take 1 tablet (20 mg total) by mouth every other day as needed for erectile dysfunction. [name], MD  Active Self  traMADol  (ULTRAM ) 50 MG tablet [id number] Yes Take 50 mg by mouth daily as needed for moderate pain or severe  pain. [provider]  Active Self           Med Note (Brent Taylor, Brent Taylor   Mon May 07, 2024 10:16 PM) Still has on hand             Home Care and Equipment/Supplies: Were Home Health Services Ordered?: No Any new equipment or medical supplies ordered?: No  Functional Questionnaire: Do you need assistance with bathing/showering or dressing?: No Do you need assistance with meal preparation?: No Do you need assistance with eating?: No Do you have difficulty maintaining continence: No Do you need assistance with getting out of bed/getting out of a chair/moving?: Yes (uses cane as needed) Do you have difficulty managing or taking your medications?: No  Follow up appointments reviewed: PCP Follow-up appointment confirmed?: No (collaborated with care guide and scheduled post hospital visit with primary care provider for 8/5  @ 930 am with [name], pt does not want this appointment, states he/ spouse need certain time of day & prefer Dr. Kennyth, pt will call) MD Provider Line Number:[phone] Given: No Specialist Hospital Follow-up appointment confirmed?: Yes Date of Specialist follow-up appointment?: 05/21/24 Follow-Up Specialty Provider:: cardiologist  @ 930 am Do you need transportation to your follow-up appointment?: No Do you understand care options if your condition(s) worsen?: Yes-patient verbalized understanding  SDOH Interventions Today    Flowsheet Row Most Recent Value  SDOH Interventions   Food Insecurity Interventions Intervention Not Indicated  Housing Interventions Intervention Not Indicated  Transportation Interventions Intervention Not Indicated  Utilities Interventions Intervention Not Indicated    Mliss [facility], BSN RN Care Manager/ Transition of Care [place] Population Health [phone]

## 2024-05-10 ENCOUNTER — Ambulatory Visit (HOSPITAL_COMMUNITY)

## 2024-05-11 ENCOUNTER — Other Ambulatory Visit: Payer: Self-pay | Admitting: Internal Medicine

## 2024-05-11 ENCOUNTER — Telehealth (HOSPITAL_COMMUNITY): Payer: Self-pay | Admitting: *Deleted

## 2024-05-11 DIAGNOSIS — I480 Paroxysmal atrial fibrillation: Secondary | ICD-10-CM

## 2024-05-11 NOTE — Telephone Encounter (Signed)
 Spoke to patient as a reminder about his STRESS TEST on 05/18/24 at 11:00.

## 2024-05-11 NOTE — Telephone Encounter (Signed)
 Prescription refill request for Eliquis  received. Indication:afib Last office visit:7/25 Scr:1.11  7/25 Age: 83 Weight:108.9  kg  Prescription refilled

## 2024-05-12 LAB — CULTURE, BLOOD (ROUTINE X 2)
Culture: NO GROWTH
Culture: NO GROWTH
Special Requests: ADEQUATE

## 2024-05-14 ENCOUNTER — Ambulatory Visit (INDEPENDENT_AMBULATORY_CARE_PROVIDER_SITE_OTHER)

## 2024-05-14 ENCOUNTER — Ambulatory Visit: Payer: Self-pay | Admitting: Family Medicine

## 2024-05-14 ENCOUNTER — Encounter: Payer: Self-pay | Admitting: Family Medicine

## 2024-05-14 ENCOUNTER — Ambulatory Visit: Admitting: Family Medicine

## 2024-05-14 VITALS — BP 133/80 | HR 55 | Temp 97.2°F | Ht 72.0 in | Wt 242.0 lb

## 2024-05-14 DIAGNOSIS — I2510 Atherosclerotic heart disease of native coronary artery without angina pectoris: Secondary | ICD-10-CM | POA: Diagnosis not present

## 2024-05-14 DIAGNOSIS — N390 Urinary tract infection, site not specified: Secondary | ICD-10-CM

## 2024-05-14 DIAGNOSIS — E1159 Type 2 diabetes mellitus with other circulatory complications: Secondary | ICD-10-CM

## 2024-05-14 DIAGNOSIS — I152 Hypertension secondary to endocrine disorders: Secondary | ICD-10-CM

## 2024-05-14 DIAGNOSIS — J189 Pneumonia, unspecified organism: Secondary | ICD-10-CM | POA: Diagnosis not present

## 2024-05-14 DIAGNOSIS — R918 Other nonspecific abnormal finding of lung field: Secondary | ICD-10-CM | POA: Diagnosis not present

## 2024-05-14 LAB — URINALYSIS, ROUTINE W REFLEX MICROSCOPIC
Bilirubin Urine: NEGATIVE
Ketones, ur: NEGATIVE
Leukocytes,Ua: NEGATIVE
Nitrite: NEGATIVE
Specific Gravity, Urine: 1.015 (ref 1.000–1.030)
Total Protein, Urine: NEGATIVE
Urine Glucose: NEGATIVE
Urobilinogen, UA: 0.2 (ref 0.0–1.0)
pH: 6 (ref 5.0–8.0)

## 2024-05-14 NOTE — Patient Instructions (Signed)
 It was very nice to see you today!  I am glad that you are feeling better!  We will check an x-ray and a urine sample.  Let us  know if you have any change in symptoms.  Return if symptoms worsen or fail to improve.   Take care, Dr Kennyth  PLEASE NOTE:  If you had any lab tests, please let us  know if you have not heard back within a few days. You may see your results on mychart before we have a chance to review them but we will give you a call once they are reviewed by us .   If we ordered any referrals today, please let us  know if you have not heard from their office within the next week.   If you had any urgent prescriptions sent in today, please check with the pharmacy within an hour of our visit to make sure the prescription was transmitted appropriately.   Please try these tips to maintain a healthy lifestyle:  Eat at least 3 REAL meals and 1-2 snacks per day.  Aim for no more than 5 hours between eating.  If you eat breakfast, please do so within one hour of getting up.   Each meal should contain half fruits/vegetables, one quarter protein, and one quarter carbs (no bigger than a computer mouse)  Cut down on sweet beverages. This includes juice, soda, and sweet tea.   Drink at least 1 glass of water with each meal and aim for at least 8 glasses per day  Exercise at least 150 minutes every week.

## 2024-05-14 NOTE — Progress Notes (Signed)
 Chest x-ray shows that his pneumonia has cleared.  He does have a small amount of fluid at the base of his lung due to his recent pneumonia.  This is not a clinical concern and should continue to improve.  Do not need to do any other testing for this at this point.

## 2024-05-14 NOTE — Assessment & Plan Note (Signed)
 Follow-up with cardiology.  On Eliquis  and statin.  Has upcoming nuclear stress test later this week.

## 2024-05-14 NOTE — Progress Notes (Signed)
 Chief Complaint:  Brent Taylor is a 83 y.o. male who presents today for a TCM visit.  Assessment/Plan:  New/Acute Problems: CAP  Overall reassuring exam today.  Clinically appears well.  Will check repeat chest x-ray today per patient request.  He has completed his course of antibiotics.    UTI Symptoms have resolved.  Will recheck UA and urine culture today per patient request.  He will let us  know if he has any recurrence of symptoms.  Chronic Problems Addressed Today: Hypertension associated with diabetes (HCC) At goal today on regimen per cardiology with losartan  25 mg daily and metoprolol  tartrate 50 mg twice daily.  Coronary artery disease Follow-up with cardiology.  On Eliquis  and statin.  Has upcoming nuclear stress test later this week.     Subjective:  HPI:  Summary of Hospital admission: Reason for admission: CAP Date of admission: 05/07/2024 Date of discharge: 05/08/2024 Date of Interactive contact: 05/09/2024 Summary of Hospital course: Patient presented to the ED with fatigue, cough, fever, and and weakness on 05/07/2024.  Chest x-ray showed pneumonia.  He was started on ceftriaxone  plus azithromycin  and given nebulizers.  Improved significantly overnight and was transitioned to Augmentin  and azithromycin  and discharged home.  Interim history:  He has been doing well since being discharged home. He has finished his course of antibiotics.  He is no longer having cough or fever.  ROS: Per HPI, otherwise a complete review of systems was negative.   PMH:  The following were reviewed and entered/updated in epic: Past Medical History:  Diagnosis Date   Atrial fibrillation (HCC)    Cancer (HCC)    hx - melanoma   Chicken pox    Colon polyps    Coronary artery disease    Depression    No need for therapy.  Improved   Diabetes (HCC)    Diverticula, colon    Duodenal nodule    Dyspnea    with exertion   GERD (gastroesophageal reflux disease)    Hemorrhoids     Hyperlipidemia    Hypertension    Sepsis (HCC)    Sepsis (HCC)    Sleep apnea    Tinnitus    UTI (lower urinary tract infection)    Patient Active Problem List   Diagnosis Date Noted   Pneumonia 05/07/2024   Avitaminosis D 04/30/2024   DOE (dyspnea on exertion) 04/25/2024   Atypical atrial flutter (HCC) 11/25/2023   Iron deficiency anemia 08/01/2023   Esophageal obstruction due to food impaction 02/05/2023   Peripheral neuropathy 12/22/2022   Spinal stenosis 11/08/2022   Gait abnormality 09/16/2022   Degenerative disc disease, cervical 09/16/2022   B12 deficiency 04/22/2022   Left shoulder pain 01/26/2022   Erectile dysfunction 10/21/2021   Thoracic Aortic dilatation (HCC) 02/13/2021   OSA (obstructive sleep apnea) 12/09/2020   Sensorineural hearing loss (SNHL) of left ear with restricted hearing of right ear 08/19/2020   Subjective tinnitus, bilateral 08/19/2020   Atrial fibrillation (HCC) 06/10/2020   Hypercoagulable state due to persistent atrial fibrillation (HCC) 03/31/2020   Spinal stenosis of lumbar region 08/29/2019   Type 2 diabetes mellitus with vascular disease (HCC) 10/20/2016   Hypertension associated with diabetes (HCC) 11/20/2015   Coronary artery disease 12/10/2011   Dyslipidemia associated with type 2 diabetes mellitus (HCC) 12/10/2011   Major depression in remission (HCC) 12/10/2011   GERD (gastroesophageal reflux disease) 12/10/2011   Hx of colonic polyps 12/10/2011   History of melanoma 12/10/2011   Past Surgical History:  Procedure Laterality Date   CARDIAC CATHETERIZATION     CARDIOVERSION N/A 11/11/2023   Procedure: CARDIOVERSION;  Surgeon: Shlomo Wilbert SAUNDERS, MD;  Location: [facility] IN[facility]ASIVE [facility];  Service: Cardiovascular;  Laterality: N/A;   ESOPHAGOGASTRODUODENOSCOPY N/A 02/05/2023   Procedure: ESOPHAGOGASTRODUODENOSCOPY (EGD);  Surgeon: Eda Iha, MD;  Location: [facility] ENDOSCOPY;  Service: Gastroenterology;  Laterality: N/A;   FOREIGN BODY  REMOVAL  02/05/2023   Procedure: FOREIGN BODY REMOVAL;  Surgeon: Eda Iha, MD;  Location: [facility] ENDOSCOPY;  Service: Gastroenterology;;   LUMBAR LAMINECTOMY/DECOMPRESSION MICRODISCECTOMY N/A 11/08/2022   Procedure: LUMBAR FOUR FIVE DECOMPRESSION AND IN SITU FUSION;  Surgeon: Burnetta Aures, MD;  Location: [facility] OR;  Service: Orthopedics;  Laterality: N/A;    MELANOMA EXCISION     LEFT CHEST   MELANOMA EXCISION      Family History  Problem Relation Age of Onset   Heart Problems Mother        Skipping   Colon cancer Neg Hx    Esophageal cancer Neg Hx    Rectal cancer Neg Hx    Prostate cancer Neg Hx     Medications- Reconciled discharge and current medications in Epic.  Current Outpatient Medications  Medication Sig Dispense Refill   acetaminophen  (TYLENOL ) 650 MG CR tablet Take 650-1,300 mg by mouth daily as needed for pain.     albuterol  (VENTOLIN  HFA) 108 (90 Base) MCG/ACT inhaler Inhale 2 puffs into the lungs every 6 (six) hours as needed for wheezing or shortness of breath. 8 g 2   amoxicillin -clavulanate (AUGMENTIN ) 875-125 MG tablet Take 1 tablet by mouth 2 (two) times daily. 6 tablet 0   Continuous Glucose Sensor (FREESTYLE LIBRE 3 SENSOR) MISC Place 1 sensor on the skin Q14 days. Use to check glucose continuously Code E11.6 2 each 5   cyanocobalamin  (VITAMIN B12) 1000 MCG tablet Take 1,000 mcg by mouth once a week.     ELIQUIS  5 MG TABS tablet TAKE 1 TABLET BY MOUTH 2 TIMES A DAY 60 tablet 5   ferrous sulfate  325 (65 FE) MG EC tablet Take 325 mg by mouth daily with breakfast.     FLUoxetine  (PROZAC ) 40 MG capsule TAKE 1 CAPSULE BY MOUTH DAILY 90 capsule 3   fluticasone (FLONASE) 50 MCG/ACT nasal spray Place 1 spray into both nostrils daily as needed for allergies or rhinitis.     furosemide  (LASIX ) 20 MG tablet Take 1 tablet (20 mg total) by mouth daily. 30 tablet 3   losartan  (COZAAR ) 25 MG tablet Take 1 tablet (25 mg total) by mouth daily. 90 tablet 3   metoprolol   tartrate (LOPRESSOR ) 50 MG tablet Take 1 tablet (50 mg total) by mouth 2 (two) times daily. 180 tablet 3   pantoprazole  (PROTONIX ) 40 MG tablet Take 40 mg by mouth daily.     pravastatin  (PRAVACHOL ) 80 MG tablet TAKE ONE TABLET BY MOUTH EVERY EVENING 90 tablet 2   tadalafil  (CIALIS ) 20 MG tablet Take 1 tablet (20 mg total) by mouth every other day as needed for erectile dysfunction. 30 tablet 5   traMADol  (ULTRAM ) 50 MG tablet Take 50 mg by mouth daily as needed for moderate pain or severe pain.     No current facility-administered medications for this visit.    Allergies-reviewed and updated Allergies  Allergen Reactions   Gabapentin  Other (See Comments)    Lethargic and depressed, aching   Lipitor [Atorvastatin Calcium ]     Muscle ache    Social History   Socioeconomic History  Marital status: Married    Spouse name: Not on file   Number of children: 2   Years of education: Not on file   Highest education level: Professional school degree (e.g., MD, DDS, DVM, JD)  Occupational History   Occupation: Optician, dispensing   Occupation: retired  Tobacco Use   Smoking status: Former    Types: Cigarettes   Smokeless tobacco: Never   Tobacco comments:    Former smoker 06/17/23  Vaping Use   Vaping status: Never Used  Substance and Sexual Activity   Alcohol use: No   Drug use: No   Sexual activity: Not on file  Other Topics Concern   Not on file  Social History Narrative   Lives with wife in [place].   Retired [employer]   Attends [employer]     Social Drivers of Health   Financial Resource Strain: Low Risk  (04/26/2024)   Overall Financial Resource Strain (CARDIA)    Difficulty of Paying Living Expenses: Not hard at all  Food Insecurity: No Food Insecurity (05/09/2024)   Hunger Vital Sign    Worried About Running Out of Food in the Last Year: Never true    Ran Out of Food in the Last Year: Never true  Transportation Needs: No Transportation Needs  (05/09/2024)   PRAPARE - Administrator, Civil Service (Medical): No    Lack of Transportation (Non-Medical): No  Physical Activity: Insufficiently Active (04/26/2024)   Exercise Vital Sign    Days of Exercise per Week: 1 day    Minutes of Exercise per Session: 20 min  Stress: No Stress Concern Present (04/26/2024)   [employer] of Occupational Health - Occupational Stress Questionnaire    Feeling of Stress: Only a little  Social Connections: Moderately Integrated (05/07/2024)   Social Connection and Isolation Panel    Frequency of Communication with Friends and Family: More than three times a week    Frequency of Social Gatherings with Friends and Family: Once a week    Attends Religious Services: More than 4 times per year    Active Member of [employer] or Organizations: No    Attends Engineer, structural: Never    Marital Status: Married        Objective:  Physical Exam: BP 133/80   Pulse (!) 55   Temp (!) 97.2 F (36.2 C) (Temporal)   Ht 6' (1.829 m)   Wt 242 lb (109.8 kg)   SpO2 96%   BMI 32.82 kg/m   Gen: NAD, resting comfortably CV: RRR with no murmurs appreciated Pulm: NWOB, CTAB with no crackles, wheezes, or rhonchi GI: Normal bowel sounds present. Soft, Nontender, Nondistended. MSK: No edema, cyanosis, or clubbing noted Skin: Warm, dry Neuro: Grossly normal, moves all extremities Psych: Normal affect and thought content      Othman Masur M. Kennyth, MD 05/14/2024 11:15 AM

## 2024-05-14 NOTE — Assessment & Plan Note (Signed)
 At goal today on regimen per cardiology with losartan  25 mg daily and metoprolol  tartrate 50 mg twice daily.

## 2024-05-16 ENCOUNTER — Inpatient Hospital Stay: Admitting: Internal Medicine

## 2024-05-16 LAB — URINE CULTURE
MICRO NUMBER:: 16782906
SPECIMEN QUALITY:: ADEQUATE

## 2024-05-17 NOTE — Progress Notes (Signed)
 His urine culture is positive but with a different bacteria than his previous culture. Recommend that we send in another round of antibiotics. Please send in cipro500mg  x 7 days. Recommend he come back in 1-2 weeks to recheck

## 2024-05-18 ENCOUNTER — Ambulatory Visit (HOSPITAL_COMMUNITY)
Admission: RE | Admit: 2024-05-18 | Discharge: 2024-05-18 | Disposition: A | Discharge status: Home / Self Care | Source: Ambulatory Visit | Attending: Cardiovascular Disease | Admitting: Cardiovascular Disease

## 2024-05-18 DIAGNOSIS — R0609 Other forms of dyspnea: Secondary | ICD-10-CM | POA: Diagnosis not present

## 2024-05-18 LAB — MYOCARDIAL PERFUSION IMAGING
LV dias vol: 166 mL (ref 62–150)
LV sys vol: 75 mL
Nuc Stress EF: 42 %
Peak HR: 111 {beats}/min
Rest HR: 56 {beats}/min
Rest Nuclear Isotope Dose: 10.6 mCi
SDS: 2
SRS: 13
SSS: 8
ST Depression (mm): 0 mm
Stress Nuclear Isotope Dose: 29.1 mCi
TID: 1.07

## 2024-05-18 MED ORDER — REGADENOSON 0.4 MG/5ML IV SOLN
INTRAVENOUS | Status: AC
  Filled 2024-05-18: qty 5

## 2024-05-18 MED ORDER — TECHNETIUM TC 99M TETROFOSMIN IV KIT
10.6000 | PACK | Freq: Once | INTRAVENOUS | Status: AC | PRN
  Administered 2024-05-18: 10.6 via INTRAVENOUS

## 2024-05-18 MED ORDER — TECHNETIUM TC 99M TETROFOSMIN IV KIT
29.1000 | PACK | Freq: Once | INTRAVENOUS | Status: AC | PRN
  Administered 2024-05-18: 29.1 via INTRAVENOUS

## 2024-05-18 MED ORDER — REGADENOSON 0.4 MG/5ML IV SOLN
0.4000 mg | Freq: Once | INTRAVENOUS | Status: AC
  Administered 2024-05-18: 0.4 mg via INTRAVENOUS

## 2024-05-18 MED ORDER — CIPROFLOXACIN HCL 500 MG PO TABS: 500.0000 mg | ORAL_TABLET | Freq: Two times a day (BID) | ORAL | 0 refills | Status: DC

## 2024-05-19 ENCOUNTER — Encounter: Payer: Self-pay | Admitting: Internal Medicine

## 2024-05-20 NOTE — Progress Notes (Unsigned)
 Electrophysiology Office Note:   Date:  05/21/2024  ID:  Brent Taylor, DOB [birth year], MRN [id number]  Primary Cardiologist: Stanly DELENA Leavens, MD Electrophysiologist: Fonda Kitty, MD      History of Present Illness:   Brent Taylor is a 83 y.o. male with h/o PAF, CAD in [place] in 2007 with moderate disease NOS, HLD, OSA and incidental mild aortic dilation (4.0 cm) who is being seen today for evaluation for catheter ablation at the request of Dr. Leavens.  Discussed the use of AI scribe software for clinical note transcription with the patient, who gave verbal consent to proceed.  History of Present Illness Brent Taylor is an 83 year old male with atrial fibrillation who presents with persistent atrial flutter. He was referred by Dr. Leavens for evaluation of his atrial fibrillation.  He is experiencing persistent atrial flutter, having transitioned from infrequent episodes to a more constant state. He previously underwent a cardioversion procedure without significant improvement in symptoms, but suspects that he did not maintain normal rhythm for very long.  He describes his current state as feeling like a 'two' on a scale where one is good and three is bad, often related to his chronic fatigue and dyspnea on exertion.  He also reports a hereditary 'skip' in his heartbeat, which his mother also had. He mentions having neuropathy, which is slowly improving. He is currently taking Cipro  for a UTI, which he will finish on Thursday.  No new or acute complaints otherwise.   Review of systems complete and found to be negative unless listed in HPI.   EP Information / Studies Reviewed:    EKG is not ordered today. EKG from 05/08/24 reviewed which showed AFL      Nuclear Stress 05/18/24:    Findings are consistent with infarction and no ischemia. The study is intermediate risk. based on EF   No ST deviation was noted.   LV perfusion is abnormal. There is no evidence of ischemia.  There is evidence of infarction. Defect 1: There is a large defect with moderate reduction in uptake present in the mid to basal inferior and inferolateral location(s) that is fixed. There is abnormal wall motion in the defect area. Consistent with infarction.   Left ventricular function is abnormal. Global function is moderately reduced. There was a single regional abnormality. Nuclear stress EF: 42%. The left ventricular ejection fraction is moderately decreased (30-44%). End diastolic cavity size is moderately enlarged. End systolic cavity size is moderately enlarged.   CT images were obtained for attenuation correction and were examined for the presence of coronary calcium  when appropriate.   Coronary calcium  was present on the attenuation correction CT images. Moderate coronary calcifications were present. Coronary calcifications were present in the left anterior descending artery, left circumflex artery and right coronary artery distribution(s).   Prior study not available for comparison.  Zio 05/2023:    Predominant underlying rhythm was atrial fibrillation (100% burden).   Several runs of NSVT, longest 11 beats.  May represent aberrant conduction.   Isolated PVCs were occasional (4.9%).   Triggered and diary events associated with atrial fibrillation.  Echo 12/2022:   1. Left ventricular ejection fraction, by estimation, is 50 to 55%. The  left ventricle has low normal function. The left ventricle has no regional  wall motion abnormalities. There is mild concentric left ventricular  hypertrophy. Left ventricular  diastolic parameters are indeterminate.   2. Right ventricular systolic function is normal. The right ventricular  size is normal. There  is normal pulmonary artery systolic pressure.   3. Left atrial size was severely dilated.   4. Right atrial size was severely dilated.   5. The mitral valve is normal in structure. Mild mitral valve  regurgitation. No evidence of mitral  stenosis.   6. The aortic valve is tricuspid. Aortic valve regurgitation is not  visualized. No aortic stenosis is present.   7. Aortic dilatation noted. There is moderate dilatation of the ascending  aorta, measuring 46 mm.   8. The inferior vena cava is normal in size with greater than 50%  respiratory variability, suggesting right atrial pressure of 3 mmHg.   Risk Assessment/Calculations:    CHA2DS2-VASc Score = 5   This indicates a 7.2% annual risk of stroke. The patient's score is based upon: CHF History: 0 HTN History: 1 Diabetes History: 1 Stroke History: 0 Vascular Disease History: 1 Age Score: 2 Gender Score: 0             Physical Exam:   VS:  BP 130/84 (BP Location: Left Arm, Patient Position: Sitting, Cuff Size: Large)   Pulse 92   Ht 6' (1.829 m)   Wt 239 lb (108.4 kg)   SpO2 94%   BMI 32.41 kg/m    Wt Readings from Last 3 Encounters:  05/21/24 239 lb (108.4 kg)  05/18/24 240 lb (108.9 kg)  05/14/24 242 lb (109.8 kg)     GEN: Well nourished, well developed in no acute distress NECK: No JVD CARDIAC: Normal rate, regular rhythm RESPIRATORY:  Clear to auscultation without rales, wheezing or rhonchi  ABDOMEN: Soft, non-distended EXTREMITIES:  No edema; No deformity   ASSESSMENT AND PLAN:    #Persistent atrial fibrillation: Associated with heart failure.  For this reason, we have prioritize a rhythm control strategy.  His EF is reduced and he has presence of scar on a stress test, so his antiarrhythmic options are amiodarone  or Tikosyn. #Atypical atrial flutter:  #Hypercoagulable state due to AF/AFL: Patient is scheduled to complete ciprofloxacin  on Thursday of this week.  We will start amiodarone  load next Monday -400 mg twice daily x 7 days, 200 mg twice daily x 7 days, 200 mg daily thereafter.  Patient will take amiodarone  for 2 weeks then return for cardioversion.  I will see him approximately 2 to 3 weeks after cardioversion to assess for maintenance of  sinus rhythm as well as for symptomatic improvement in normal rhythm.  At that visit we will make decision regarding rhythm control strategy moving forward, continuing amiodarone  versus catheter ablation. Continue metoprolol  50 mg twice daily for now.  May need to adjust after starting amiodarone .  #HFmrEF: Continue GDMT regimen of losartan , metoprolol  and follow-up with Dr. Santo.  #Hypertension At goal today.  Recommend checking blood pressures 1-2 times per week at home and recording the values.  Recommend bringing these recordings to the primary care physician.  #OSA: Encourage CPAP  Follow up with Dr. Kennyth in 6 weeks.   Signed, Fonda Kennyth, MD

## 2024-05-21 ENCOUNTER — Ambulatory Visit: Payer: Self-pay

## 2024-05-21 ENCOUNTER — Encounter: Payer: Self-pay | Admitting: Cardiology

## 2024-05-21 ENCOUNTER — Ambulatory Visit: Attending: Cardiology | Admitting: Cardiology

## 2024-05-21 ENCOUNTER — Other Ambulatory Visit: Payer: Self-pay

## 2024-05-21 VITALS — BP 130/84 | HR 92 | Ht 72.0 in | Wt 239.0 lb

## 2024-05-21 DIAGNOSIS — I4819 Other persistent atrial fibrillation: Secondary | ICD-10-CM

## 2024-05-21 DIAGNOSIS — I5022 Chronic systolic (congestive) heart failure: Secondary | ICD-10-CM | POA: Diagnosis not present

## 2024-05-21 DIAGNOSIS — I484 Atypical atrial flutter: Secondary | ICD-10-CM

## 2024-05-21 DIAGNOSIS — G4733 Obstructive sleep apnea (adult) (pediatric): Secondary | ICD-10-CM

## 2024-05-21 DIAGNOSIS — D6869 Other thrombophilia: Secondary | ICD-10-CM

## 2024-05-21 DIAGNOSIS — I10 Essential (primary) hypertension: Secondary | ICD-10-CM

## 2024-05-21 MED ORDER — AMIODARONE HCL 200 MG PO TABS: ORAL_TABLET | ORAL | 3 refills | Status: AC

## 2024-05-21 NOTE — Patient Instructions (Signed)
 Medication Instructions:  Your physician has recommended you make the following change in your medication:  On Monday 8/18 START Amiodarone   - take 2 tablets (400 mg total) TWICE a day for 1 week, then  - take 1 tablet (200 mg total) TWICE a day for 1 week, then  - take 1 tablet (200 mg total) ONCE a day  *If you need a refill on your cardiac medications before your next appointment, please call your pharmacy*  Lab Work: TODAY: BMET and CBC  Testing/Procedures: Cardioversion  Your physician has recommended that you have a Cardioversion (DCCV). Electrical Cardioversion uses a jolt of electricity to your heart either through paddles or wired patches attached to your chest. This is a controlled, usually prescheduled, procedure. Defibrillation is done under light anesthesia in the hospital, and you usually go home the day of the procedure. This is done to get your heart back into a normal rhythm. You are not awake for the procedure. Please see the instruction sheet given to you today.  Follow-Up: At [facility], you and your health needs are our priority.  As part of our continuing mission to provide you with exceptional heart care, our providers are all part of one team.  This team includes your primary Cardiologist (physician) and Advanced Practice Providers or APPs (Physician Assistants and Nurse Practitioners) who all work together to provide you with the care you need, when you need it.  Your next appointment:   6 weeks  Provider:   Fonda Kitty, MD

## 2024-05-22 LAB — CBC
Hematocrit: 54.3 % — ABNORMAL HIGH (ref 37.5–51.0)
Hemoglobin: 17.3 g/dL (ref 13.0–17.7)
MCH: 29.8 pg (ref 26.6–33.0)
MCHC: 31.9 g/dL (ref 31.5–35.7)
MCV: 94 fL (ref 79–97)
Platelets: 250 x10E3/uL (ref 150–450)
RBC: 5.8 x10E6/uL (ref 4.14–5.80)
RDW: 13 % (ref 11.6–15.4)
WBC: 8.9 x10E3/uL (ref 3.4–10.8)

## 2024-05-22 LAB — BASIC METABOLIC PANEL WITH GFR
BUN/Creatinine Ratio: 15 (ref 10–24)
BUN: 18 mg/dL (ref 8–27)
CO2: 21 mmol/L (ref 20–29)
Calcium: 9 mg/dL (ref 8.6–10.2)
Chloride: 102 mmol/L (ref 96–106)
Creatinine, Ser: 1.18 mg/dL (ref 0.76–1.27)
Glucose: 153 mg/dL — ABNORMAL HIGH (ref 70–99)
Potassium: 4.3 mmol/L (ref 3.5–5.2)
Sodium: 140 mmol/L (ref 134–144)
eGFR: 60 mL/min/1.73 (ref >59)

## 2024-05-23 ENCOUNTER — Ambulatory Visit: Payer: Self-pay | Admitting: Cardiology

## 2024-05-25 ENCOUNTER — Telehealth: Payer: Self-pay | Admitting: Internal Medicine

## 2024-05-25 NOTE — Telephone Encounter (Signed)
 Left message for patient advising that he should continue plans for cardioversion per Dr. Kennyth. Advised to call back with any questions.

## 2024-05-25 NOTE — Telephone Encounter (Signed)
  Patient is asking if the results of his stress test will have any bearing on the cardioversion he is scheduled for on 06/14/24. Please advise

## 2024-05-25 NOTE — Telephone Encounter (Signed)
 Patient would like Dr. Kennyth to review his stress test results and let him know if he would like him to proceed with DCCV on 9/4.

## 2024-06-10 DIAGNOSIS — N390 Urinary tract infection, site not specified: Secondary | ICD-10-CM | POA: Diagnosis not present

## 2024-06-11 ENCOUNTER — Encounter: Payer: Self-pay | Admitting: Cardiology

## 2024-06-11 ENCOUNTER — Other Ambulatory Visit: Payer: Self-pay | Admitting: Internal Medicine

## 2024-06-11 ENCOUNTER — Encounter: Payer: Self-pay | Admitting: Internal Medicine

## 2024-06-11 ENCOUNTER — Encounter: Payer: Self-pay | Admitting: Family Medicine

## 2024-06-12 NOTE — Telephone Encounter (Signed)
 I appreciate the update.  His PSA was normal in January however due to his frequent UTIs it may not be a bad idea for him to see urology.  Please place referral if he is agreeable.

## 2024-06-12 NOTE — Telephone Encounter (Signed)
 See note

## 2024-06-13 ENCOUNTER — Other Ambulatory Visit: Payer: Self-pay | Admitting: *Deleted

## 2024-06-13 ENCOUNTER — Ambulatory Visit: Attending: Cardiovascular Disease | Admitting: *Deleted

## 2024-06-13 VITALS — HR 43

## 2024-06-13 DIAGNOSIS — N390 Urinary tract infection, site not specified: Secondary | ICD-10-CM

## 2024-06-13 DIAGNOSIS — I484 Atypical atrial flutter: Secondary | ICD-10-CM

## 2024-06-13 DIAGNOSIS — I4819 Other persistent atrial fibrillation: Secondary | ICD-10-CM | POA: Diagnosis not present

## 2024-06-13 NOTE — Progress Notes (Signed)
   Nurse Visit   Date of Encounter: 06/13/2024 ID: Brent Taylor, DOB 1941/03/05, MRN [id number]  PCP:  [name], MD   [facility] Providers Cardiologist:  Stanly DELENA Leavens, MD Electrophysiologist:  Fonda Kennyth, MD  Sleep Medicine:  Wilbert Bihari, MD      Visit Details   VS:  pulse 43bpm  HPI: Presents for pre-cardioversion EKG. Reports bradycardia at home with HR in 40s. Decreased metoprolol  tartrate from 50mg  BID to 25mg  BID. Reports being on as low of a dose as 12.5mg  BID in the past. Currently being treated for UTI with cefdinir - tolerating well, no side effects.   Wt Readings from Last 3 Encounters:  05/21/24 239 lb (108.4 kg)  05/18/24 240 lb (108.9 kg)  05/14/24 242 lb (109.8 kg)     Reason for visit: EKG, pre DCCV Performed today: EKG and Provider consulted: EKG reviewed Changes (medications, testing, etc.) : d/c metoprolol  tartrate secondary to bradycardia, cancel cardioversion  Length of Visit: 20 minutes    EKG: SB w/1st degree AVB @ 43bpm, left axis deviation, nonspecific T wave abnormality  Advised will send back to DOD (Dr. Wonda), Dr. Kennyth, and Dr. Sedrick for review. Will need follow up on which appointments are necessary - on in Sept and one in Oct currently scheduled.   Medications Adjustments/Labs and Tests Ordered: Orders Placed This Encounter  Procedures   EKG 12-Lead   No orders of the defined types were placed in this encounter.    Bonney Loring Andriette HERO, RN  06/13/2024 2:41 PM  Reviewed EKG.  Patient remarks sinus bradycardia 43 bpm.  Medications reviewed.  Advised to stop metoprolol  to tartrate, continue amiodarone , cardioversion canceled.  Follow-up with primary cardiologist.  Ozell Wonda 06/13/2024 2:50 PM

## 2024-06-14 ENCOUNTER — Ambulatory Visit (HOSPITAL_COMMUNITY): Admission: RE | Admit: 2024-06-14 | Source: Home / Self Care | Admitting: Cardiology

## 2024-06-14 ENCOUNTER — Encounter (HOSPITAL_COMMUNITY): Admission: RE | Payer: Self-pay | Source: Home / Self Care

## 2024-06-14 DIAGNOSIS — Z01818 Encounter for other preprocedural examination: Secondary | ICD-10-CM

## 2024-06-14 SURGERY — CARDIOVERSION (CATH LAB)
Anesthesia: Monitor Anesthesia Care

## 2024-06-18 ENCOUNTER — Other Ambulatory Visit: Payer: Self-pay | Admitting: Family Medicine

## 2024-06-21 ENCOUNTER — Other Ambulatory Visit: Payer: Self-pay | Admitting: Family Medicine

## 2024-06-27 ENCOUNTER — Other Ambulatory Visit: Payer: Self-pay | Admitting: Family Medicine

## 2024-07-02 NOTE — Progress Notes (Signed)
 Electrophysiology Office Note:   Date:  07/03/2024  ID:  Brent Taylor, DOB 1941-09-04, MRN [id number]  Primary Cardiologist: Stanly DELENA Leavens, MD Electrophysiologist: Fonda Kitty, MD      History of Present Illness:   Brent Taylor is a 83 y.o. male with h/o PAF, CAD in [place] in 2007 with moderate disease NOS, HLD, OSA and incidental mild aortic dilation (4.0 cm) who is being seen today for evaluation for catheter ablation at the request of Dr. Leavens.  Discussed the use of AI scribe software for clinical note transcription with the patient, who gave verbal consent to proceed.  History of Present Illness Brent Taylor is an 83 year old male with atrial fibrillation who presents for follow up evaluation of his heart rhythm management.  He has a history of atrial fibrillation and was recently cardioverted to normal sinus rhythm, but reverted back to atrial fibrillation within a few days. During the brief period in normal rhythm, he experienced increased energy and was more active.  He is currently on amiodarone , initially prescribed at a dose of 200 mg twice daily. His health monitor indicated he was in atrial fibrillation about 80% of the time a few months ago, but more recently, it showed 40% of the time. Despite this, he still experiences significant fatigue and low energy levels when in atrial fibrillation, often feeling 'drug out' and unable to engage in daily activities.  He uses a personal system to rate his daily energy levels, with 'three' indicating a day spent mostly in a recliner due to fatigue, 'two' for moderate activity, and 'one' for feeling great.  He has been on amiodarone  twice daily for the past two weeks after previously being on it once daily. He feels better when in sinus rhythm, but still experiences days of fatigue even when not in atrial fibrillation.  He is also on a blood thinner to prevent blood clots, which is important given his history of atrial  fibrillation.   Review of systems complete and found to be negative unless listed in HPI.   EP Information / Studies Reviewed:    EKG is not ordered today. EKG from 05/08/24 reviewed which showed AFL  EKG Interpretation Date/Time:  Tuesday July 03 2024 09:14:50 EDT Ventricular Rate:  81 PR Interval:    QRS Duration:  110 QT Interval:  394 QTC Calculation: 457 R Axis:   -52  Text Interpretation: Atrial flutter with variable A-V block versus coarse atrial fibrillation Left axis deviation Incomplete right bundle branch block Possible Lateral infarct , age undetermined When compared with ECG of 13-Jun-2024 14:29, Atrial flutter shila has replaced Sinus rhythm Vent. rate has increased BY  38 BPM T wave inversion no longer evident in Inferior leads Nonspecific T wave abnormality has replaced inverted T waves in Lateral leads Confirmed by Kitty Fonda [id number]) on 07/03/2024 9:24:58 PM   Nuclear Stress 05/18/24:    Findings are consistent with infarction and no ischemia. The study is intermediate risk. based on EF   No ST deviation was noted.   LV perfusion is abnormal. There is no evidence of ischemia. There is evidence of infarction. Defect 1: There is a large defect with moderate reduction in uptake present in the mid to basal inferior and inferolateral location(s) that is fixed. There is abnormal wall motion in the defect area. Consistent with infarction.   Left ventricular function is abnormal. Global function is moderately reduced. There was a single regional abnormality. Nuclear stress EF: 42%. The left ventricular ejection  fraction is moderately decreased (30-44%). End diastolic cavity size is moderately enlarged. End systolic cavity size is moderately enlarged.   CT images were obtained for attenuation correction and were examined for the presence of coronary calcium  when appropriate.   Coronary calcium  was present on the attenuation correction CT images. Moderate coronary  calcifications were present. Coronary calcifications were present in the left anterior descending artery, left circumflex artery and right coronary artery distribution(s).   Prior study not available for comparison.  Zio 05/2023:    Predominant underlying rhythm was atrial fibrillation (100% burden).   Several runs of NSVT, longest 11 beats.  May represent aberrant conduction.   Isolated PVCs were occasional (4.9%).   Triggered and diary events associated with atrial fibrillation.  Echo 12/2022:   1. Left ventricular ejection fraction, by estimation, is 50 to 55%. The  left ventricle has low normal function. The left ventricle has no regional  wall motion abnormalities. There is mild concentric left ventricular  hypertrophy. Left ventricular  diastolic parameters are indeterminate.   2. Right ventricular systolic function is normal. The right ventricular  size is normal. There is normal pulmonary artery systolic pressure.   3. Left atrial size was severely dilated.   4. Right atrial size was severely dilated.   5. The mitral valve is normal in structure. Mild mitral valve  regurgitation. No evidence of mitral stenosis.   6. The aortic valve is tricuspid. Aortic valve regurgitation is not  visualized. No aortic stenosis is present.   7. Aortic dilatation noted. There is moderate dilatation of the ascending  aorta, measuring 46 mm.   8. The inferior vena cava is normal in size with greater than 50%  respiratory variability, suggesting right atrial pressure of 3 mmHg.   Risk Assessment/Calculations:    CHA2DS2-VASc Score = 5   This indicates a 7.2% annual risk of stroke. The patient's score is based upon: CHF History: 0 HTN History: 1 Diabetes History: 1 Stroke History: 0 Vascular Disease History: 1 Age Score: 2 Gender Score: 0         Physical Exam:   VS:  BP (!) 146/92 (BP Location: Left Arm, Patient Position: Sitting, Cuff Size: Large)   Pulse 81   Ht 6' (1.829 m)   Wt  246 lb 8 oz (111.8 kg)   SpO2 95%   BMI 33.43 kg/m    Wt Readings from Last 3 Encounters:  07/03/24 246 lb 8 oz (111.8 kg)  05/21/24 239 lb (108.4 kg)  05/18/24 240 lb (108.9 kg)     GEN: Well nourished, well developed in no acute distress NECK: No JVD CARDIAC: Normal rate, regular rhythm RESPIRATORY:  Clear to auscultation without rales, wheezing or rhonchi  ABDOMEN: Soft, non-distended EXTREMITIES:  No edema; No deformity   ASSESSMENT AND PLAN:    #Persistent atrial fibrillation: Associated with heart failure.  For this reason, we have prioritize a rhythm control strategy.  His EF is reduced and he has presence of scar on a stress test, so his antiarrhythmic options are amiodarone  or Tikosyn.  He notes improvement in symptoms immediately following cardioversion.  He states his cardiac monitoring is now showing A-fib burden 40% after starting amiodarone  rather than 80% previously.  Unclear if he feels significantly better on days that he is not in atrial fibrillation. #Atrial flutter:  #High risk medication use: Amiodarone . LFTs normal. TSH normal. Continue amiodarone  200 mg twice daily for another 2 weeks then decrease to 200 mg once daily. Continue metoprolol   50 mg twice daily for now.  May need to adjust after starting amiodarone . We will order a 2-week monitor to assess his overall burden of atrial fibrillation and see if this significantly correlates with symptoms, primarily fatigue.  Based on results of monitor patient will make decision regarding staying on amiodarone  long-term versus catheter ablation.  If burden is high despite amiodarone , then favor stopping whether or not patient would like to pursue catheter ablation.  If patient would like to pursue catheter ablation, then we will continue amiodarone  as bridge.  #Hypercoagulable state due to AF/AFL: Continue Eliquis  5 mg twice daily.  No bleeding issues.  #HFmrEF: Well compensated on exam today. Continue GDMT regimen of  losartan , metoprolol  and follow-up with Dr. Santo.  #Hypertension At goal today.  Recommend checking blood pressures 1-2 times per week at home and recording the values.  Recommend bringing these recordings to the primary care physician.  #OSA: Encourage CPAP  Follow up with Dr. Kennyth in 6-8 weeks.   Signed, Fonda Kennyth, MD

## 2024-07-03 ENCOUNTER — Ambulatory Visit

## 2024-07-03 ENCOUNTER — Ambulatory Visit: Attending: Cardiology | Admitting: Cardiology

## 2024-07-03 ENCOUNTER — Encounter: Payer: Self-pay | Admitting: Cardiology

## 2024-07-03 VITALS — BP 146/92 | HR 81 | Ht 72.0 in | Wt 246.5 lb

## 2024-07-03 DIAGNOSIS — G4733 Obstructive sleep apnea (adult) (pediatric): Secondary | ICD-10-CM | POA: Diagnosis not present

## 2024-07-03 DIAGNOSIS — I4819 Other persistent atrial fibrillation: Secondary | ICD-10-CM

## 2024-07-03 DIAGNOSIS — I484 Atypical atrial flutter: Secondary | ICD-10-CM | POA: Diagnosis not present

## 2024-07-03 DIAGNOSIS — D6869 Other thrombophilia: Secondary | ICD-10-CM | POA: Diagnosis not present

## 2024-07-03 DIAGNOSIS — I10 Essential (primary) hypertension: Secondary | ICD-10-CM

## 2024-07-03 DIAGNOSIS — I5022 Chronic systolic (congestive) heart failure: Secondary | ICD-10-CM

## 2024-07-03 NOTE — Patient Instructions (Signed)
 Medication Instructions:  Your physician has recommended you make the following change in your medication:  1) Continue taking amiodarone  200 twice daily for 2 weeks, then decrease 200 mg once daily   *If you need a refill on your cardiac medications before your next appointment, please call your pharmacy*  Testing/Procedures: Event Monitor Your physician has recommended that you wear an event monitor. Event monitors are medical devices that record the heart's electrical activity. Doctors most often us  these monitors to diagnose arrhythmias. Arrhythmias are problems with the speed or rhythm of the heartbeat. The monitor is a small, portable device. You can wear one while you do your normal daily activities. This is usually used to diagnose what is causing palpitations/syncope (passing out).  Follow-Up: At [facility], you and your health needs are our priority.  As part of our continuing mission to provide you with exceptional heart care, our providers are all part of one team.  This team includes your primary Cardiologist (physician) and Advanced Practice Providers or APPs (Physician Assistants and Nurse Practitioners) who all work together to provide you with the care you need, when you need it.  Your next appointment:   8 weeks  Provider:   Fonda Kitty, MD     Other Instructions Brent Taylor- Long Term Monitor Instructions  Your physician has requested you wear a ZIO patch monitor for 14 days.  This is a single patch monitor. [employer] supplies one patch monitor per enrollment. Additional stickers are not available. Please do not apply patch if you will be having a Nuclear Stress Test,  Echocardiogram, Cardiac CT, MRI, or Chest Xray during the period you would be wearing the  monitor. The patch cannot be worn during these tests. You cannot remove and re-apply the  ZIO XT patch monitor.  Your ZIO patch monitor will be mailed 3 day USPS to your address on file. It may take 3-5 days   to receive your monitor after you have been enrolled.  Once you have received your monitor, please review the enclosed instructions. Your monitor  has already been registered assigning a specific monitor serial # to you.  Billing and Patient Assistance Program Information  We have supplied [employer] with any of your insurance information on file for billing purposes. [employer] offers a sliding scale Patient Assistance Program for patients that do not have  insurance, or whose insurance does not completely cover the cost of the ZIO monitor.  You must apply for the Patient Assistance Program to qualify for this discounted rate.  To apply, please call [employer] at [phone], select option 4, select option 2, ask to apply for  Patient Assistance Program. Brent Taylor will ask your household income, and how many people  are in your household. They will quote your out-of-pocket cost based on that information.  [employer] will also be able to set up a 53-month, interest-free payment plan if needed.  Applying the monitor   Shave hair from upper left chest.  Hold abrader disc by orange tab. Rub abrader in 40 strokes over the upper left chest as  indicated in your monitor instructions.  Clean area with 4 enclosed alcohol pads. Let dry.  Apply patch as indicated in monitor instructions. Patch will be placed under collarbone on left  side of chest with arrow pointing upward.  Rub patch adhesive wings for 2 minutes. Remove Brent Taylor label marked 1. Remove the Brent Taylor  label marked 2. Rub patch adhesive wings for 2 additional minutes.  While looking in a  mirror, press and release button in center of patch. A small green light will  flash 3-4 times. This will be your only indicator that the monitor has been turned on.  Do not shower for the first 24 hours. You may shower after the first 24 hours.  Press the button if you feel a symptom. You will hear a small click. Record Date, Time and  Symptom in the Patient  Logbook.  When you are ready to remove the patch, follow instructions on the last 2 pages of Patient  Logbook. Stick patch monitor onto the last page of Patient Logbook.  Place Patient Logbook in the blue and Brent Taylor box. Use locking tab on box and tape box closed  securely. The blue and Brent Taylor box has prepaid postage on it. Please place it in the mailbox as  soon as possible. Your physician should have your test results approximately 7 days after the  monitor has been mailed back to [facility].  Call [facility] Customer Care at [phone] if you have questions regarding  your ZIO XT patch monitor. Call them immediately if you see an orange light blinking on your  monitor.  If your monitor falls off in less than 4 days, contact our Monitor department at [phone].  If your monitor becomes loose or falls off after 4 days call [employer] at [phone] for  suggestions on securing your monitor

## 2024-07-03 NOTE — Progress Notes (Unsigned)
Enrolled for [employer] to mail a ZIO XT long term holter monitor to the patients address on file.   Dr. [name] to read. 

## 2024-07-05 ENCOUNTER — Encounter: Payer: Self-pay | Admitting: Family Medicine

## 2024-07-05 DIAGNOSIS — N3020 Other chronic cystitis without hematuria: Secondary | ICD-10-CM | POA: Diagnosis not present

## 2024-07-05 DIAGNOSIS — R351 Nocturia: Secondary | ICD-10-CM | POA: Diagnosis not present

## 2024-07-05 DIAGNOSIS — R3912 Poor urinary stream: Secondary | ICD-10-CM | POA: Diagnosis not present

## 2024-07-05 DIAGNOSIS — N401 Enlarged prostate with lower urinary tract symptoms: Secondary | ICD-10-CM | POA: Diagnosis not present

## 2024-07-05 DIAGNOSIS — R350 Frequency of micturition: Secondary | ICD-10-CM | POA: Diagnosis not present

## 2024-07-05 DIAGNOSIS — N35013 Post-traumatic anterior urethral stricture: Secondary | ICD-10-CM | POA: Diagnosis not present

## 2024-07-13 ENCOUNTER — Other Ambulatory Visit: Payer: Self-pay | Admitting: Internal Medicine

## 2024-07-13 ENCOUNTER — Encounter: Payer: Self-pay | Admitting: Family Medicine

## 2024-07-13 DIAGNOSIS — I2510 Atherosclerotic heart disease of native coronary artery without angina pectoris: Secondary | ICD-10-CM

## 2024-07-13 DIAGNOSIS — I480 Paroxysmal atrial fibrillation: Secondary | ICD-10-CM

## 2024-07-23 ENCOUNTER — Ambulatory Visit: Attending: Emergency Medicine | Admitting: Emergency Medicine

## 2024-07-23 ENCOUNTER — Encounter: Payer: Self-pay | Admitting: Emergency Medicine

## 2024-07-23 VITALS — BP 120/68 | HR 78 | Ht 72.0 in | Wt 248.0 lb

## 2024-07-23 DIAGNOSIS — I5022 Chronic systolic (congestive) heart failure: Secondary | ICD-10-CM

## 2024-07-23 DIAGNOSIS — I10 Essential (primary) hypertension: Secondary | ICD-10-CM | POA: Diagnosis not present

## 2024-07-23 DIAGNOSIS — I480 Paroxysmal atrial fibrillation: Secondary | ICD-10-CM | POA: Diagnosis not present

## 2024-07-23 DIAGNOSIS — I2510 Atherosclerotic heart disease of native coronary artery without angina pectoris: Secondary | ICD-10-CM | POA: Diagnosis not present

## 2024-07-23 DIAGNOSIS — E785 Hyperlipidemia, unspecified: Secondary | ICD-10-CM

## 2024-07-23 DIAGNOSIS — G4733 Obstructive sleep apnea (adult) (pediatric): Secondary | ICD-10-CM | POA: Diagnosis not present

## 2024-07-23 DIAGNOSIS — I77819 Aortic ectasia, unspecified site: Secondary | ICD-10-CM | POA: Diagnosis not present

## 2024-07-23 NOTE — Patient Instructions (Addendum)
 Medication Instructions:  NO CHANGES  Lab Work: NONE TO BE DONE TODAY.   Testing/Procedures: NONE  Follow-Up: At [facility], you and your health needs are our priority.  As part of our continuing mission to provide you with exceptional heart care, our providers are all part of one team.  This team includes your primary Cardiologist (physician) and Advanced Practice Providers or APPs (Physician Assistants and Nurse Practitioners) who all work together to provide you with the care you need, when you need it.  Your next appointment:   6 MONTHS  Provider:   Stanly DELENA Leavens, MD

## 2024-07-23 NOTE — Progress Notes (Signed)
 Cardiology Office Note:    Date:  07/23/2024  ID:  [name], DOB [birth year], MRN [id number] PCP: [name], MD  [facility] Providers Cardiologist:  Stanly DELENA Leavens, MD Electrophysiologist:  Fonda Kennyth, MD  Sleep Medicine:  Wilbert Bihari, MD       Patient Profile:       Chief Complaint: 57-month follow-up History of Present Illness:  Melquisedec Journey is a 83 y.o. male with visit-pertinent history of paroxysmal atrial fibrillation, coronary artery disease, hyperlipidemia, mild aortic dilation  His coronary artery disease was identified as moderate nonobstructive disease in [place]  in 2007 that was managed medically without stenting.  Cardiac monitor 05/2023 showed predominant underlying rhythm was atrial fibrillation at 100% burden.  Echocardiogram 12/23/2022 showed LVEF 50 to 55%, no RWMA, mild LVH, RV function and size normal, normal PASP, severely dilated left and right atria, mild mitral valve regurgitation, moderate dilation of ascending aorta measuring 46 mm.  He underwent Lexiscan  Myoview  on 05/18/2024 with findings consistent with infarction and no ischemia.  He presented for precardioversion EKG on 06/13/2024.  EKG showed sinus bradycardia with first-degree AV block at 43 bpm.  His metoprolol  tartrate was discontinued secondary to bradycardia.  His cardioversion was canceled.  He was last seen in clinic on 07/03/2024 by Dr. Kennyth.  He was recently cardioverted to normal sinus rhythm but reverted back to atrial fibrillation within a few days.  It was noted his A-fib burden was 40% after starting amiodarone  rather than 80% previously.  He was to continue amiodarone  200 mg twice daily for 2 weeks then decrease to 200 mg once daily.  He was continued on metoprolol  50 mg twice daily.  2-week monitor was ordered that is currently pending.   Discussed the use of AI scribe software for clinical note transcription with the patient, who gave verbal consent to proceed.  History  of Present Illness Joram Meilech Virts is an 83 year old male with atrial fibrillation and coronary artery disease who presents for follow-up on his cardiac conditions.  Today he is doing well without acute cardiovascular concerns.  He feels in sinus rhythm more often, with his watch indicating atrial fibrillation about 34% of the time, down from 84%. He experiences lethargy during episodes of atrial fibrillation.  He feels he is in sinus rhythm today based off his symptoms.  He experiences shortness of breath with exertion, such as walking or grocery shopping, but denies chest pain.  He reports his shortness of breath is chronic.  He uses a cane for balance and is able to perform daily activities.  He uses a CPAP machine for sleep apnea but reports not sleeping well with it, preferring to nap in a chair during the day. He is on pravastatin  for cholesterol management, taking it nightly without missing doses. His LDL was recently 97. He previously experienced muscle aches with atorvastatin.  He denies chest pains, orthopnea, PND, LEE, weight gain, syncope, presyncope, lightheadedness, dizziness.   Review of systems:  Please see the history of present illness. All other systems are reviewed and otherwise negative.      Studies Reviewed:        Lexiscan  Myoview  05/18/2024   Findings are consistent with infarction and no ischemia. The study is intermediate risk. based on EF   No ST deviation was noted.   LV perfusion is abnormal. There is no evidence of ischemia. There is evidence of infarction. Defect 1: There is a large defect with moderate reduction in uptake present  in the mid to basal inferior and inferolateral location(s) that is fixed. There is abnormal wall motion in the defect area. Consistent with infarction.   Left ventricular function is abnormal. Global function is moderately reduced. There was a single regional abnormality. Nuclear stress EF: 42%. The left ventricular ejection fraction  is moderately decreased (30-44%). End diastolic cavity size is moderately enlarged. End systolic cavity size is moderately enlarged.   CT images were obtained for attenuation correction and were examined for the presence of coronary calcium  when appropriate.   Coronary calcium  was present on the attenuation correction CT images. Moderate coronary calcifications were present. Coronary calcifications were present in the left anterior descending artery, left circumflex artery and right coronary artery distribution(s).   Prior study not available for comparison.  ZIO 05/25/2023   Predominant underlying rhythm was atrial fibrillation (100% burden).   Several runs of NSVT, longest 11 beats.  May represent aberrant conduction.   Isolated PVCs were occasional (4.9%).   Triggered and diary events associated with atrial fibrillation.   Atrial fibrillation with occasional PVCS  Echocardiogram 12/23/2022 1. Left ventricular ejection fraction, by estimation, is 50 to 55%. The  left ventricle has low normal function. The left ventricle has no regional  wall motion abnormalities. There is mild concentric left ventricular  hypertrophy. Left ventricular  diastolic parameters are indeterminate.   2. Right ventricular systolic function is normal. The right ventricular  size is normal. There is normal pulmonary artery systolic pressure.   3. Left atrial size was severely dilated.   4. Right atrial size was severely dilated.   5. The mitral valve is normal in structure. Mild mitral valve  regurgitation. No evidence of mitral stenosis.   6. The aortic valve is tricuspid. Aortic valve regurgitation is not  visualized. No aortic stenosis is present.   7. Aortic dilatation noted. There is moderate dilatation of the ascending  aorta, measuring 46 mm.   8. The inferior vena cava is normal in size with greater than 50%  respiratory variability, suggesting right atrial pressure of 3 mmHg.   ZIO 09/30/2021 Patch  Wear Time:  13 days and 21 hours    Predominant underlying rhythm was sinus rhythm Multiple runs of SVT, longest and fastest 1 minute 33 seconds at an average of 161 bpm 24.8% supraventricular ectopy Less than 1% ventricular ectopy Symptoms of weakness associated with sinus rhythm and atrial ectopy  Echocardiogram 10/16/2021 1. Left ventricular ejection fraction, by estimation, is 50 to 55%. The  left ventricle has low normal function. The left ventricle has no regional  wall motion abnormalities. There is mild-to-moderate concentric left  ventricular hypertrophy. Left  ventricular diastolic parameters are consistent with Grade II diastolic  dysfunction (pseudonormalization).   2. Right ventricular systolic function is normal. The right ventricular  size is normal.   3. Left atrial size was moderately dilated.   4. The mitral valve is grossly normal. Mild mitral valve regurgitation.   5. The aortic valve is tricuspid. There is mild calcification of the  aortic valve. There is mild thickening of the aortic valve. Aortic valve  regurgitation is not visualized. Aortic valve sclerosis/calcification is  present, without any evidence of  aortic stenosis.   6. Aortic dilatation noted. There is moderate dilatation of the ascending  aorta, measuring 44 mm.  Risk Assessment/Calculations:    CHA2DS2-VASc Score = 5   This indicates a 7.2% annual risk of stroke. The patient's score is based upon: CHF History: 0 HTN History: 1  Diabetes History: 1 Stroke History: 0 Vascular Disease History: 1 Age Score: 2 Gender Score: 0             Physical Exam:   VS:  BP 120/68 (BP Location: Left Arm, Patient Position: Sitting, Cuff Size: Normal)   Pulse 78   Ht 6' (1.829 m)   Wt 248 lb (112.5 kg)   BMI 33.63 kg/m    Wt Readings from Last 3 Encounters:  07/23/24 248 lb (112.5 kg)  07/03/24 246 lb 8 oz (111.8 kg)  05/21/24 239 lb (108.4 kg)    GEN: Well nourished, well developed in no acute  distress NECK: No JVD; No carotid bruits CARDIAC: RRR, no murmurs, rubs, gallops RESPIRATORY:  Clear to auscultation without rales, wheezing or rhonchi  ABDOMEN: Soft, non-tender, non-distended EXTREMITIES:  No edema; No acute deformity      Assessment and Plan:  Coronary artery disease Noted to have nonobstructive moderate disease in 2007 [facility]) Lexiscan  Myoview  05/2024 consistent with infarction and no ischemia.  EF 42%.  Moderate coronary calcifications present in the LAD, LCx, and RCA - Today he is stable without chest pains.  Reports mild chronic DOE that is unchanged.  Remains fairly active without limitations.  No symptoms to suggest active angina, no indication further ischemic evaluation at this time  HFmrEF Echocardiogram 12/2022 with LVEF 50 to 55% Lexiscan  Myoview  05/2024 consistent with infarction no ischemia with EF 42% Possibly associated with his atrial fibrillation with no ischemia noted on recent stress test - Today he appears euvolemic and well compensated on exam.  His volume status is stable.  Has mild DOE.  No orthopnea, PND, LEE - Can repeat echocardiogram to reassess EF after decision is made on catheter ablation per EP.  Currently pending heart monitor at this time to assess A-fib burden - Continue furosemide  20 mg daily and losartan  25 mg daily  Hyperlipidemia LDL 97 on 10/2023 but not well-controlled - He prefers to work on diet and exercise and have a his lipid panel repeated by his PCP on 10/2024 - Continue pravastatin  80 mg daily  Paroxysmal atrial fibrillation Associated with heart failure as his EF was reduced and has presence of scar recent stress test 05/2024 Reports his Apple Watch is indicating atrial fibrillation about 34% of the time He is primarily symptom is fatigue.  Only has mild DOE that does not seem to be bothersome Metoprolol  previously discontinued due to bradycardia - Today he appears to maintain normal sinus rhythm per auscultation - Last seen  by EP on 9/23.  Currently pending heart monitor to assess his overall A-fib burden.  Based on these results there will be a decision made regarding staying on amiodarone  long-term versus catheter ablation - Management per EP - Managed on amiodarone  200 mg daily and Eliquis  5 mg twice daily  Hypertension Blood pressure well-controlled at 120/68 - Continue losartan  25 mg daily  Obstructive sleep apnea - Remains adherent to CPAP therapy  Moderate aortic dilation Echo cardiogram 12/2022 shows moderate dilation of ascending aorta measuring 46 mm - Will need a follow-up CT scan for routine monitoring, however if he pursues ablation he will get CT PV prior      Dispo:  Return in about 6 months (around 01/21/2025).  Signed, Lum LITTIE Louis, NP

## 2024-07-26 DIAGNOSIS — I484 Atypical atrial flutter: Secondary | ICD-10-CM | POA: Diagnosis not present

## 2024-07-26 DIAGNOSIS — I4819 Other persistent atrial fibrillation: Secondary | ICD-10-CM | POA: Diagnosis not present

## 2024-08-11 DIAGNOSIS — I10 Essential (primary) hypertension: Secondary | ICD-10-CM | POA: Diagnosis not present

## 2024-08-11 DIAGNOSIS — I484 Atypical atrial flutter: Secondary | ICD-10-CM | POA: Diagnosis not present

## 2024-08-11 DIAGNOSIS — I4819 Other persistent atrial fibrillation: Secondary | ICD-10-CM

## 2024-08-11 DIAGNOSIS — I5022 Chronic systolic (congestive) heart failure: Secondary | ICD-10-CM | POA: Diagnosis not present

## 2024-08-17 ENCOUNTER — Ambulatory Visit

## 2024-08-17 ENCOUNTER — Ambulatory Visit (INDEPENDENT_AMBULATORY_CARE_PROVIDER_SITE_OTHER): Admitting: Family Medicine

## 2024-08-17 ENCOUNTER — Ambulatory Visit: Payer: Self-pay

## 2024-08-17 VITALS — BP 148/72 | HR 61 | Temp 97.9°F | Ht 72.0 in | Wt 249.6 lb

## 2024-08-17 DIAGNOSIS — M47816 Spondylosis without myelopathy or radiculopathy, lumbar region: Secondary | ICD-10-CM | POA: Diagnosis not present

## 2024-08-17 DIAGNOSIS — M25551 Pain in right hip: Secondary | ICD-10-CM | POA: Diagnosis not present

## 2024-08-17 DIAGNOSIS — R1031 Right lower quadrant pain: Secondary | ICD-10-CM

## 2024-08-17 DIAGNOSIS — M160 Bilateral primary osteoarthritis of hip: Secondary | ICD-10-CM | POA: Diagnosis not present

## 2024-08-17 DIAGNOSIS — M8580 Other specified disorders of bone density and structure, unspecified site: Secondary | ICD-10-CM | POA: Diagnosis not present

## 2024-08-17 NOTE — Telephone Encounter (Signed)
 Reviewed

## 2024-08-17 NOTE — Progress Notes (Signed)
 Established Patient Office Visit   Subjective  Patient ID: Brent Taylor, male    DOB: 1941-03-07  Age: 83 y.o. MRN: [id number]  Chief Complaint  Patient presents with   Acute Visit    Patient came in today for right side groin abdominal pain the shoots down to the mid leg, started a week ago, hurts while walking and standing, rate of pain 3 out of 10     Pt is an 83 yo male followed by Dr. Worth Kitty and seen for acute concern.   Pt endorses soreness deep in R lower abdomen and groin x several days. Pain radiates into anterior R thigh.  Exacerbated by standing and walking. He describes needing to pause when standing due to the pain and finds it difficult to take the first few steps. Pain currently less severe than before.  Denies constipation, changes in urination, fever, chills, abdominal cramping, heavy lifting, pushing, pulling, swelling in groin, leg, scrotum. Had mild nausea yesterday but no vomiting.   A signal patch helps.      Patient Active Problem List   Diagnosis Date Noted   Pneumonia 05/07/2024   Avitaminosis D 04/30/2024   DOE (dyspnea on exertion) 04/25/2024   Atypical atrial flutter (HCC) 11/25/2023   Iron deficiency anemia 08/01/2023   Esophageal obstruction due to food impaction 02/05/2023   Peripheral neuropathy 12/22/2022   Spinal stenosis 11/08/2022   Gait abnormality 09/16/2022   Degenerative disc disease, cervical 09/16/2022   B12 deficiency 04/22/2022   Left shoulder pain 01/26/2022   Erectile dysfunction 10/21/2021   Thoracic Aortic dilatation (HCC) 02/13/2021   OSA (obstructive sleep apnea) 12/09/2020   Sensorineural hearing loss (SNHL) of left ear with restricted hearing of right ear 08/19/2020   Subjective tinnitus, bilateral 08/19/2020   Atrial fibrillation (HCC) 06/10/2020   Hypercoagulable state due to persistent atrial fibrillation (HCC) 03/31/2020   Spinal stenosis of lumbar region 08/29/2019   Type 2 diabetes mellitus with vascular disease  (HCC) 10/20/2016   Hypertension associated with diabetes (HCC) 11/20/2015   Coronary artery disease 12/10/2011   Dyslipidemia associated with type 2 diabetes mellitus (HCC) 12/10/2011   Major depression in remission 12/10/2011   GERD (gastroesophageal reflux disease) 12/10/2011   Hx of colonic polyps 12/10/2011   History of melanoma 12/10/2011   Past Medical History:  Diagnosis Date   Anemia 10/24   low hemaglobin   Anxiety    Arthritis 2015   Atrial fibrillation (HCC)    Cancer (HCC) 2000   hx - melanoma   CHF (congestive heart failure) (HCC) 2001   Not sure   Chicken pox    Colon polyps    Coronary artery disease 1995   Depression    No need for therapy.  Improved   Diabetes (HCC)    Diverticula, colon    Duodenal nodule    Dyspnea    with exertion   GERD (gastroesophageal reflux disease) 1980   Heart murmur 2019   Melanoma removed, no reacurrance   Hemorrhoids    Hyperlipidemia ?   Hypertension Since teen years   Hereditaryh   Neuromuscular disorder (HCC) 01/23   neuopathy   Sepsis (HCC)    Sepsis (HCC)    Skin cancer 2000   Sleep apnea 2020   Use CPAP   Tinnitus    UTI (lower urinary tract infection)    Past Surgical History:  Procedure Laterality Date   CARDIAC CATHETERIZATION  ?   CARDIOVERSION N/A 11/11/2023   Procedure: CARDIOVERSION;  Surgeon: Shlomo Wilbert SAUNDERS, MD;  Location: [facility] INVASIVE CV LAB;  Service: Cardiovascular;  Laterality: N/A;   ESOPHAGOGASTRODUODENOSCOPY N/A 02/05/2023   Procedure: ESOPHAGOGASTRODUODENOSCOPY (EGD);  Surgeon: Eda Iha, MD;  Location: [facility] ENDOSCOPY;  Service: Gastroenterology;  Laterality: N/A;   EYE SURGERY  2005   FOREIGN BODY REMOVAL  02/05/2023   Procedure: FOREIGN BODY REMOVAL;  Surgeon: Eda Iha, MD;  Location: [facility] ENDOSCOPY;  Service: Gastroenterology;;   LUMBAR LAMINECTOMY/DECOMPRESSION MICRODISCECTOMY N/A 11/08/2022   Procedure: LUMBAR FOUR FIVE DECOMPRESSION AND IN SITU FUSION;  Surgeon: Burnetta Aures, MD;  Location: [facility] OR;  Service: Orthopedics;  Laterality: N/A;    MELANOMA EXCISION     LEFT CHEST   MELANOMA EXCISION     SPINE SURGERY  10/20/2022   VASECTOMY  1980   Social History   Tobacco Use   Smoking status: Former    Types: Cigarettes   Smokeless tobacco: Never   Tobacco comments:    Former smoker 06/17/23  Vaping Use   Vaping status: Never Used  Substance Use Topics   Alcohol use: No   Drug use: No   Family History  Problem Relation Age of Onset   Heart Problems Mother        Skipping   Heart attack Mother    Hypertension Mother    Heart attack Father    Heart disease Father    Heart attack Sister    Colon cancer Neg Hx    Esophageal cancer Neg Hx    Rectal cancer Neg Hx    Prostate cancer Neg Hx    Allergies  Allergen Reactions   Gabapentin  Other (See Comments)    Lethargic and depressed, aching   Lipitor [Atorvastatin Calcium ]     Muscle ache    ROS Negative unless stated above    Objective:     BP (!) 148/72 (BP Location: Left Arm, Patient Position: Sitting, Cuff Size: Large)   Pulse 61   Temp 97.9 F (36.6 C) (Oral)   Ht 6' (1.829 m)   Wt 249 lb 9.6 oz (113.2 kg)   SpO2 97%   BMI 33.85 kg/m  BP Readings from Last 3 Encounters:  08/17/24 (!) 148/72  07/23/24 120/68  07/03/24 (!) 146/92   Wt Readings from Last 3 Encounters:  08/17/24 249 lb 9.6 oz (113.2 kg)  07/23/24 248 lb (112.5 kg)  07/03/24 246 lb 8 oz (111.8 kg)   Physical Exam Constitutional:      General: He is not in acute distress.    Appearance: Normal appearance.  HENT:     Head: Normocephalic and atraumatic.     Nose: Nose normal.     Mouth/Throat:     Mouth: Mucous membranes are moist.  Cardiovascular:     Rate and Rhythm: Normal rate and regular rhythm.     Heart sounds: Normal heart sounds. No murmur heard.    No gallop.  Pulmonary:     Effort: Pulmonary effort is normal. No respiratory distress.     Breath sounds: Normal breath sounds. No  wheezing, rhonchi or rales.  Abdominal:     General: Bowel sounds are normal.     Palpations: Abdomen is soft.     Tenderness: There is no abdominal tenderness. There is no guarding or rebound.  Musculoskeletal:     Left hip: Normal.     Comments: +Log roll on RLE.  Discomfort with Abduction of RLE.  -FADIR, FABER.  Skin:    General: Skin is warm  and dry.  Neurological:     Mental Status: He is alert and oriented to person, place, and time.       Assessment & Plan:   Right groin pain -     DG HIP UNILAT W OR W/O PELVIS MIN 4 VIEWS RIGHT; Future  Acute R hip/groin pain deep to panus.  Discussed possible causes including OA of hip, inguinal hernia though denies sx.  Fx less likely as no trauma reported.  Also consider femoral hernia though no edema or TTP of hip or groin.  Obtain xray of hip.  Continue supportive care.  F/u with pcp for worsened symptoms.  Of note:  Above xray ordered by this provider, however cancelled by staff and re-entered as an infant pelvis 2-3 views?  This provider is unable to cancel the new order as the images have already been done under it.  Per chart: orders will be automatically signed upon closing this note.  Return if symptoms worsen or fail to improve.   Clotilda JONELLE Single, MD

## 2024-08-17 NOTE — Telephone Encounter (Signed)
 FYI Only or Action Required?: FYI only for provider: appointment scheduled on 08/17/24.  Patient was last seen in primary care on 05/14/2024 by [name], MD.  Called Nurse Triage reporting Abdominal Pain.  Symptoms began several days ago.  Interventions attempted: OTC medications: Signal relief patch and Rest, hydration, or home remedies.  Symptoms are: gradually worsening.  Triage Disposition: See Physician Within 24 Hours  Patient/caregiver understands and will follow disposition?: Yes  Copied from CRM #[id number]. Topic: Clinical - Red Word Triage >> Aug 17, 2024 12:45 PM Alfonso ORN wrote: Red Word that prompted transfer to Nurse Triage: pain Pt symptoms: right side appendix area and upper leg pain when walking not when laying down Reason for Disposition  [1] MODERATE pain (e.g., interferes with normal activities) AND [2] pain comes and goes (cramps) AND [3] present > 24 hours  (Exception: Pain with Vomiting or Diarrhea - see that Guideline.)  Answer Assessment - Initial Assessment Questions Onset RLQ abdominal pain 3-4 days ago with mild nausea, radiates halfway down right leg. No fever, bloating, emesis or changes to bowel or bladder habits. No appt availability today at PCP office. Scheduled with different office in pt region today. Advised UC or ED for worsening symptoms.  1. LOCATION: Where does it hurt?      RLQ abdomen  2. RADIATION: Does the pain shoot anywhere else? (e.g., chest, back)     Down right leg about halfway down thigh  3. ONSET: When did the pain begin? (Minutes, hours or days ago)      3-4 days ago.   4. SUDDEN: Gradual or sudden onset?     Sudden  5. PATTERN Does the pain come and go, or is it constant?     Intermittent. Only when standing and walking   6. SEVERITY: How bad is the pain?  (e.g., Scale 1-10; mild, moderate, or severe)     5-6/10 aching pain  7. RECURRENT SYMPTOM: Have you ever had this type of stomach pain before? If Yes,  ask: When was the last time? and What happened that time?      Denies  8. CAUSE: What do you think is causing the stomach pain? (e.g., gallstones, recent abdominal surgery)     Denies  9. RELIEVING/AGGRAVATING FACTORS: What makes it better or worse? (e.g., antacids, bending or twisting motion, bowel movement)     Pain only occur when standing and walking   10. OTHER SYMPTOMS: Do you have any other symptoms? (e.g., back pain, diarrhea, fever, urination pain, vomiting)       Mild nausea. Denies fever or pain when pressing on belly. Reports belly looks normal. No changes to bowel or bladder habits.  Protocols used: Abdominal Pain - Male-A-AH

## 2024-08-22 ENCOUNTER — Telehealth: Payer: Self-pay | Admitting: Pharmacist

## 2024-08-22 MED ORDER — LOSARTAN POTASSIUM 25 MG PO TABS: 25.0000 mg | ORAL_TABLET | Freq: Every day | ORAL | 3 refills | Status: AC

## 2024-08-22 NOTE — Progress Notes (Signed)
 Pharmacy Quality Measure Review  This patient is appearing on a report for being at risk of failing the adherence measure for hypertension (ACEi/ARB) medications this calendar year.   Medication: losartan  50mg  Last fill date: 06/16/205 for 90 day supply  Dose of losartan  was changed to 25mg  daily 04/12/2024.  Patient has been taking 0.5 tablet of the losartan  50mg  until he uses up current supply.  Called patient today. He states he will run out of losartan  soon. Spoke with [facility] and they only have losartan  50mg  on his profile.  Sent refill request to cardiologist for Losartan  25mg  - take 1 tablet daily.   Madelin Ray, PharmD Clinical Pharmacist [facility]  Population Health [phone]

## 2024-08-23 ENCOUNTER — Ambulatory Visit: Payer: Self-pay | Admitting: Family Medicine

## 2024-08-27 NOTE — Progress Notes (Unsigned)
 Electrophysiology Office Note:   Date:  08/29/2024  ID:  [name], DOB 08/31/1941, MRN [id number]  Primary Cardiologist: Stanly DELENA Leavens, MD Electrophysiologist: Fonda Kitty, MD      History of Present Illness:   Brent Taylor is a 83 y.o. male with h/o PAF, CAD in [place] in 2007 with moderate disease NOS, HLD, OSA and incidental mild aortic dilation (4.0 cm) who is being seen today for evaluation for catheter ablation at the request of Dr. Leavens.  Discussed the use of AI scribe software for clinical note transcription with the patient, who gave verbal consent to proceed.  History of Present Illness Brent Taylor is an 83 year old male with atrial fibrillation who presents for follow-up regarding his heart rhythm management.  He reports a more regular heart rhythm, increased energy, improved ability to work, and less fatigue since starting amiodarone . No major breathing difficulties unless very active.  Previously, his heart monitor showed atrial fibrillation 80% of the time, but recent monitoring indicates a reduction to 4%. He checks his heart rhythm once or twice daily and has not observed any atrial fibrillation in several days.  He has noticed a slight weight gain and questions whether it might be related to amiodarone . He has not experienced any gastrointestinal discomfort. He is currently taking amiodarone  and Eliquis .   Review of systems complete and found to be negative unless listed in HPI.   EP Information / Studies Reviewed:    EKG is ordered today. Personal review as below.  EKG Interpretation Date/Time:  Tuesday August 28 2024 09:55:14 EST Ventricular Rate:  75 PR Interval:  242 QRS Duration:  118 QT Interval:  430 QTC Calculation: 480 R Axis:   -50  Text Interpretation: Sinus rhythm with 1st degree A-V block with Premature atrial complexes with Abberant conduction Left anterior fascicular block Left ventricular hypertrophy with QRS widening ( R  in aVL , Cornell product , Romhilt-Estes ) Possible Lateral infarct (cited on or before 03-Jul-2024) When compared with ECG of 03-Jul-2024 09:14, Sinus rhythm has replaced Atrial flutter Confirmed by Kitty Fonda [phone]) on 08/29/2024 9:44:35 AM   Zio 07/26/24:  Patch Wear Time:  13 days and 20 hours (2025-09-26T12:17:51-0400 to 2025-10-10T08:39:59-0400)   HR 49 - 176, average 71 bpm. Frequent SVT episodes, longest 10 mins and 6 seconds. 1 nonsustained VT episode, lasting 5 beats. Atrial flutter detected. Burden 4%, longest episode 13 hours and 19 mins. Frequent supraventricular ectopy, 6.0%. Rare ventricular ectopy. Symptom trigger episodes correspond to sinus with ectopy.     Nuclear Stress 05/18/24:    Findings are consistent with infarction and no ischemia. The study is intermediate risk. based on EF   No ST deviation was noted.   LV perfusion is abnormal. There is no evidence of ischemia. There is evidence of infarction. Defect 1: There is a large defect with moderate reduction in uptake present in the mid to basal inferior and inferolateral location(s) that is fixed. There is abnormal wall motion in the defect area. Consistent with infarction.   Left ventricular function is abnormal. Global function is moderately reduced. There was a single regional abnormality. Nuclear stress EF: 42%. The left ventricular ejection fraction is moderately decreased (30-44%). End diastolic cavity size is moderately enlarged. End systolic cavity size is moderately enlarged.   CT images were obtained for attenuation correction and were examined for the presence of coronary calcium  when appropriate.   Coronary calcium  was present on the attenuation correction CT images. Moderate coronary calcifications were  present. Coronary calcifications were present in the left anterior descending artery, left circumflex artery and right coronary artery distribution(s).   Prior study not available for comparison.  Zio  05/2023:    Predominant underlying rhythm was atrial fibrillation (100% burden).   Several runs of NSVT, longest 11 beats.  May represent aberrant conduction.   Isolated PVCs were occasional (4.9%).   Triggered and diary events associated with atrial fibrillation.  Echo 12/2022:   1. Left ventricular ejection fraction, by estimation, is 50 to 55%. The  left ventricle has low normal function. The left ventricle has no regional  wall motion abnormalities. There is mild concentric left ventricular  hypertrophy. Left ventricular  diastolic parameters are indeterminate.   2. Right ventricular systolic function is normal. The right ventricular  size is normal. There is normal pulmonary artery systolic pressure.   3. Left atrial size was severely dilated.   4. Right atrial size was severely dilated.   5. The mitral valve is normal in structure. Mild mitral valve  regurgitation. No evidence of mitral stenosis.   6. The aortic valve is tricuspid. Aortic valve regurgitation is not  visualized. No aortic stenosis is present.   7. Aortic dilatation noted. There is moderate dilatation of the ascending  aorta, measuring 46 mm.   8. The inferior vena cava is normal in size with greater than 50%  respiratory variability, suggesting right atrial pressure of 3 mmHg.   Risk Assessment/Calculations:    CHA2DS2-VASc Score = 5   This indicates a 7.2% annual risk of stroke. The patient's score is based upon: CHF History: 0 HTN History: 1 Diabetes History: 1 Stroke History: 0 Vascular Disease History: 1 Age Score: 2 Gender Score: 0         Physical Exam:   VS:  BP 131/78 (BP Location: Left Arm, Patient Position: Sitting, Cuff Size: Large)   Pulse 75   Ht 6' (1.829 m)   Wt 247 lb (112 kg)   SpO2 95%   BMI 33.50 kg/m    Wt Readings from Last 3 Encounters:  08/28/24 247 lb (112 kg)  08/17/24 249 lb 9.6 oz (113.2 kg)  07/23/24 248 lb (112.5 kg)     GEN: Well nourished, well developed in no  acute distress NECK: No JVD CARDIAC: Normal rate, regular rhythm RESPIRATORY:  Clear to auscultation without rales, wheezing or rhonchi  ABDOMEN: Soft, non-distended EXTREMITIES:  No edema; No deformity   ASSESSMENT AND PLAN:    #Persistent atrial fibrillation: Associated with heart failure.  For this reason, we have prioritize a rhythm control strategy.  His EF is reduced and he has presence of scar on a stress test, so his antiarrhythmic options are amiodarone  or Tikosyn.  He reports significant improvement in symptoms with reduction of AF burden on amiodarone . #Atrial flutter:  #High risk medication use: Amiodarone . LFTs normal 04/2024. TSH normal 10/2023. -Discussed treatment options today for AF including antiarrhythmic drug therapy - continuing amiodarone  or changing to Tikosyn - and ablation. Discussed risks, recovery and likelihood of success with each treatment strategy. Risk, benefits, and alternatives to EP study and ablation for afib were discussed. These risks include but are not limited to stroke, bleeding, vascular damage, tamponade, perforation, damage to the esophagus, lungs, phrenic nerve and other structures, pulmonary vein stenosis, worsening renal function, coronary vasospasm and death.  Discussed potential need for repeat ablation procedures and antiarrhythmic drugs after an initial ablation. The patient understands these risk and wishes to proceed.  We will  therefore proceed with catheter ablation at the next available time.  Carto, ICE, anesthesia are requested for the procedure.   We will obtain CT PV protocol prior to the procedure. -Continue amiodarone  200 mg once daily as bridge to ablation.  -Repeat LFTs and TSH with usual pre-ablation lab work.  #Hypercoagulable state due to AF/AFL: -Continue Eliquis  5 mg twice daily.  No bleeding issues.  -#HFmrEF: Well compensated on exam today. Continue GDMT regimen of losartan , metoprolol  and follow-up with Dr.  Santo.  #Hypertension -At goal today.  Recommend checking blood pressures 1-2 times per week at home and recording the values.  Recommend bringing these recordings to the primary care physician.  #OSA: -Encourage CPAP  Follow up with Dr. Kennyth 3 months after ablation.   Signed, Fonda Kennyth, MD

## 2024-08-28 ENCOUNTER — Other Ambulatory Visit: Payer: Self-pay

## 2024-08-28 ENCOUNTER — Encounter: Payer: Self-pay | Admitting: Cardiology

## 2024-08-28 ENCOUNTER — Ambulatory Visit: Attending: Cardiology | Admitting: Cardiology

## 2024-08-28 VITALS — BP 131/78 | HR 75 | Ht 72.0 in | Wt 247.0 lb

## 2024-08-28 DIAGNOSIS — I5022 Chronic systolic (congestive) heart failure: Secondary | ICD-10-CM | POA: Diagnosis not present

## 2024-08-28 DIAGNOSIS — I4819 Other persistent atrial fibrillation: Secondary | ICD-10-CM

## 2024-08-28 DIAGNOSIS — I484 Atypical atrial flutter: Secondary | ICD-10-CM

## 2024-08-28 DIAGNOSIS — I4892 Unspecified atrial flutter: Secondary | ICD-10-CM

## 2024-08-28 DIAGNOSIS — D6869 Other thrombophilia: Secondary | ICD-10-CM

## 2024-08-28 DIAGNOSIS — G4733 Obstructive sleep apnea (adult) (pediatric): Secondary | ICD-10-CM

## 2024-08-28 DIAGNOSIS — Z79899 Other long term (current) drug therapy: Secondary | ICD-10-CM

## 2024-08-28 DIAGNOSIS — I10 Essential (primary) hypertension: Secondary | ICD-10-CM

## 2024-08-28 NOTE — Patient Instructions (Addendum)
 Medication Instructions:  Your physician recommends that you continue on your current medications as directed. Please refer to the Current Medication list given to you today.  *If you need a refill on your cardiac medications before your next appointment, please call your pharmacy*  Testing/Procedures: Ablation Your physician has recommended that you have an ablation. Catheter ablation is a medical procedure used to treat some cardiac arrhythmias (irregular heartbeats). During catheter ablation, a long, thin, flexible tube is put into a blood vessel in your groin (upper thigh), or neck. This tube is called an ablation catheter. It is then guided to your heart through the blood vessel. Radio frequency waves destroy small areas of heart tissue where abnormal heartbeats may cause an arrhythmia to start.   You are scheduled for an Atrial Fibrillation/Flutter on Thursday, January 15th with Dr. Dr. Kennyth. Please arrive at the Main Entrance A at [facility]: [address] [place], [place] [address] at 7:30am  What To Expect:  Labs: you will need to have lab work drawn within 30 days of your procedure. Please go to any [facility] location to have these drawn - no appointment is needed. Cardiac CT Scan: this will be done about 3-4 weeks prior to your procedure. You will be contacted to schedule this test. You will receive procedure instructions either through MyChart or in the mail 4-6 week prior to your procedure.  After your procedure we recommend no driving for 4 days, no lifting over 5 lbs for 7 days, and no work or strenuous activity for 7 days.  Please contact our office at [phone] if you have any questions.    Follow-Up: We will contact you to schedule your post-procedure appointments.

## 2024-09-09 ENCOUNTER — Other Ambulatory Visit: Payer: Self-pay | Admitting: Internal Medicine

## 2024-09-18 ENCOUNTER — Telehealth: Payer: Self-pay

## 2024-09-18 NOTE — Telephone Encounter (Signed)
-----   Message from Nurse Carlyle C sent at 08/28/2024 11:04 AM EST ----- Regarding: 1/15 afib/flutter ablation Precert:  MD: Kennyth Type of ablation: A-fib/flutter Diagnosis: A-fib/flutter CPT code: A-fib (06343) Ablation scheduled (date/time): 10/25/24 at 930  Procedure:  Added to calendar? Yes Orders entered? Yes Letter complete? No, >30 days before procedure Scheduled with cath lab? Yes Any medications to hold? No Labs ordered (CBC, BMET, PT/INR if on warfarin): Yes (needs CBC, CMET and TSH) Mapping system: Doesn't matter CARTO/OPAL rep notified? No Cardiac CT needed? Yes Dye allergy? No Pre-meds ordered and instructions given? CT instruction letter given Letter method: MyChart H&P: 11/18 Device: No  Follow-up:  Cassie/Angel, please schedule Routine.  Covering RN - please send this message to [employer], EP scheduler, EP Scheduling pool, EP [employer], and CT scheduler (Brittany Lynch/Stephanie Mogg), if indicated.

## 2024-09-21 ENCOUNTER — Encounter: Payer: Self-pay | Admitting: Cardiology

## 2024-09-21 ENCOUNTER — Encounter: Payer: Self-pay | Admitting: Family Medicine

## 2024-09-21 NOTE — Telephone Encounter (Signed)
 See note

## 2024-09-21 NOTE — Telephone Encounter (Signed)
 Please have him schedule an appointment. Needs to go to the Emergency Department or urgent care if he has any worsening symptoms or fevers, chills, etc.

## 2024-09-24 NOTE — Telephone Encounter (Signed)
 Left message to return call to our office at their convenience.

## 2024-09-25 ENCOUNTER — Ambulatory Visit

## 2024-09-25 ENCOUNTER — Encounter: Payer: Self-pay | Admitting: Family Medicine

## 2024-09-25 ENCOUNTER — Ambulatory Visit: Payer: Self-pay

## 2024-09-25 ENCOUNTER — Ambulatory Visit (INDEPENDENT_AMBULATORY_CARE_PROVIDER_SITE_OTHER): Admitting: Family Medicine

## 2024-09-25 VITALS — BP 134/72 | HR 77 | Temp 98.1°F | Ht 72.0 in | Wt 250.0 lb

## 2024-09-25 DIAGNOSIS — R062 Wheezing: Secondary | ICD-10-CM

## 2024-09-25 MED ORDER — ALBUTEROL SULFATE HFA 108 (90 BASE) MCG/ACT IN AERS: 2.0000 | INHALATION_SPRAY | Freq: Four times a day (QID) | RESPIRATORY_TRACT | 0 refills | Status: AC | PRN

## 2024-09-25 MED ORDER — METHYLPREDNISOLONE 4 MG PO TBPK: ORAL_TABLET | ORAL | 0 refills | Status: DC

## 2024-09-25 NOTE — Telephone Encounter (Signed)
 FYI Only or Action Required?: Action required by provider: update on patient condition.  Patient was last seen in primary care on 08/17/2024 by Mercer Clotilda SAUNDERS, MD.  Called Nurse Triage reporting Pneumonia.  Symptoms began a week ago.  Interventions attempted: OTC medications: mucinex.  Symptoms are: gradually worsening.  Triage Disposition: See HCP Within 4 Hours (Or PCP Triage)  Patient/caregiver understands and will follow disposition?: Yes  Copied from CRM #[id number]. Topic: Clinical - Red Word Triage >> Sep 25, 2024  9:34 AM Deleta RAMAN wrote: Red Word that prompted transfer to Nurse Triage: patient feels weak. Believes he may have pneumonia requesting chest xray Reason for Disposition  [1] MILD difficulty breathing (e.g., minimal/no SOB at rest, SOB with walking, pulse < 100) AND [2] still present when not coughing  Answer Assessment - Initial Assessment Questions January 15th is scheduled an ablation-- needs to make sure his chest is clear for the ablation   Cough, sneezing, lots of chest congestion, mild nasal congestion   Patient was advised to reach out to the office to schedule. Attempted to contact CAL to connect to [facility]- unable to connect to CAL and call dropped with patient- left VM to call back  1. ONSET: When did the cough begin?      Thursday  2. SEVERITY: How bad is the cough today?      Moderate cough  3. SPUTUM: Describe the color of your sputum (e.g., none, dry cough; clear, Blaker, yellow, green)     Green, yellow 4. HEMOPTYSIS: Are you coughing up any blood? If Yes, ask: How much? (e.g., flecks, streaks, tablespoons, etc.)     denies 5. DIFFICULTY BREATHING: Are you having difficulty breathing? If Yes, ask: How bad is it? (e.g., mild, moderate, severe)      Mild SOB with activity- more due to weakness 6. FEVER: Do you have a fever? If Yes, ask: What is your temperature, how was it measured, and when did it start?     denies 7. CARDIAC  HISTORY: Do you have any history of heart disease? (e.g., heart attack, congestive heart failure)      Afib, CAD,  8. LUNG HISTORY: Do you have any history of lung disease?  (e.g., pulmonary embolus, asthma, emphysema)     Pneumonia in Sept  9. PE RISK FACTORS: Do you have a history of blood clots? (or: recent major surgery, recent prolonged travel, bedridden)     denies 10. OTHER SYMPTOMS: Do you have any other symptoms? (e.g., runny nose, wheezing, chest pain)       Runny nose, nasal and chest congestion  Protocols used: Cough - Acute Productive-A-AH

## 2024-09-25 NOTE — Telephone Encounter (Signed)
 Left message to return call to our office at their convenience.

## 2024-09-25 NOTE — Progress Notes (Signed)
 [facility]IMARY [facility]ARE [facility] [address] [place] [address] Dept: [phone] Dept Fax: [phone]  Office Visit  Subjective:    Patient ID: Brent Taylor, male    DOB: [birth year], 83 y.o..   MRN: [id number]  Chief Complaint  Patient presents with   Cough    C/o having chest congestion, cough x 5 days.  Has been taking Mucinex and Tylenol ,    History of Present Illness:  Patient is in today for respiratory symptoms. He notes that for the past 4-5 days, he has had cough and chest congestion. He feels he may be wheezing. He denies any fever. He has a history of allergic rhinitis, but does not feel his nasal congestion is significantly increased. Brent Taylor is scheduled for a cardiac ablation for atrial fibrillation in January. 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 He denies past history of asthma.  Past Medical History: Patient Active Problem List   Diagnosis Date Noted   Pneumonia 05/07/2024   Avitaminosis D 04/30/2024   DOE (dyspnea on exertion) 04/25/2024   Atypical atrial flutter (HCC) 11/25/2023   Iron deficiency anemia 08/01/2023   Esophageal obstruction due to food impaction 02/05/2023   Peripheral neuropathy 12/22/2022   Spinal stenosis 11/08/2022   Gait abnormality 09/16/2022   Degenerative disc disease, cervical 09/16/2022   B12 deficiency 04/22/2022   Left shoulder pain 01/26/2022   Erectile dysfunction 10/21/2021   Thoracic Aortic dilatation (HCC) 02/13/2021   OSA (obstructive sleep apnea) 12/09/2020   Sensorineural hearing loss (SNHL) of left ear with restricted hearing of right ear 08/19/2020   Subjective tinnitus, bilateral 08/19/2020   Atrial fibrillation (HCC) 06/10/2020   Hypercoagulable state due to persistent atrial fibrillation (HCC) 03/31/2020   Spinal stenosis of lumbar region 08/29/2019   Type 2 diabetes mellitus with  vascular disease (HCC) 10/20/2016   Hypertension associated with diabetes (HCC) 11/20/2015   Coronary artery disease 12/10/2011   Dyslipidemia associated with type 2 diabetes mellitus (HCC) 12/10/2011   Major depression in remission 12/10/2011   GERD (gastroesophageal reflux disease) 12/10/2011   Hx of colonic polyps 12/10/2011   History of melanoma 12/10/2011   Past Surgical History:  Procedure Laterality Date   CARDIAC CATHETERIZATION  ?   CARDIOVERSION N/A 11/11/2023   Procedure: CARDIOVERSION;  Surgeon: Shlomo Wilbert SAUNDERS, MD;  Location: [facility] INVASIVE CV LAB;  Service: Cardiovascular;  Laterality: N/A;   ESOPHAGOGASTRODUODENOSCOPY N/A 02/05/2023   Procedure: ESOPHAGOGASTRODUODENOSCOPY (EGD);  Surgeon: Eda Iha, MD;  Location: [facility] ENDOSCOPY;  Service: Gastroenterology;  Laterality: N/A;   EYE SURGERY  2005   FOREIGN BODY REMOVAL  02/05/2023   Procedure: FOREIGN BODY REMOVAL;  Surgeon: Eda Iha, MD;  Location: [facility] ENDOSCOPY;  Service: Gastroenterology;;   LUMBAR LAMINECTOMY/DECOMPRESSION MICRODISCECTOMY N/A 11/08/2022   Procedure: LUMBAR FOUR FIVE DECOMPRESSION AND IN SITU FUSION;  Surgeon: Burnetta Aures, MD;  Location: [facility] OR;  Service: Orthopedics;  Laterality: N/A;    MELANOMA EXCISION     LEFT CHEST   MELANOMA EXCISION     SPINE SURGERY  10/20/2022   VASECTOMY  1980   Family History  Problem Relation Age of Onset   Heart Problems Mother        Skipping   Heart attack Mother    Hypertension Mother    Heart attack Father    Heart disease Father    Heart attack Sister  Colon cancer Neg Hx    Esophageal cancer Neg Hx    Rectal cancer Neg Hx    Prostate cancer Neg Hx    Outpatient Medications Prior to Visit  Medication Sig Dispense Refill   acetaminophen  (TYLENOL ) 650 MG CR tablet Take 650-1,300 mg by mouth daily as needed for pain.     albuterol  (VENTOLIN  HFA) 108 (90 Base) MCG/ACT inhaler Inhale 2 puffs into the lungs every 6 (six) hours as needed  for wheezing or shortness of breath. 8 g 2   amiodarone  (PACERONE ) 200 MG tablet Take 2 tablets (400 mg total) by mouth 2 (two) times daily for 7 days, THEN 1 tablet (200 mg total) 2 (two) times daily for 7 days, THEN 1 tablet (200 mg total) daily. 110 tablet 3   Continuous Glucose Sensor (FREESTYLE LIBRE 3 SENSOR) MISC Place 1 sensor on the skin Q14 days. Use to check glucose continuously Code E11.6 2 each 5   cyanocobalamin  (VITAMIN B12) 1000 MCG tablet Take 1,000 mcg by mouth once a week.     ELIQUIS  5 MG TABS tablet TAKE 1 TABLET BY MOUTH 2 TIMES A DAY 60 tablet 5   FLUoxetine  (PROZAC ) 40 MG capsule TAKE 1 CAPSULE BY MOUTH DAILY 90 capsule 3   fluticasone (FLONASE) 50 MCG/ACT nasal spray Place 1 spray into both nostrils daily as needed for allergies or rhinitis.     furosemide  (LASIX ) 20 MG tablet Take 1 tablet (20 mg total) by mouth daily. 30 tablet 3   losartan  (COZAAR ) 25 MG tablet Take 1 tablet (25 mg total) by mouth daily. 90 tablet 3   pantoprazole  (PROTONIX ) 40 MG tablet Take 1 tablet (40 mg total) by mouth daily. Office visit for further refills 90 tablet 0   pravastatin  (PRAVACHOL ) 80 MG tablet TAKE ONE TABLET BY MOUTH EVERY EVENING 90 tablet 3   traMADol  (ULTRAM ) 50 MG tablet Take 50 mg by mouth daily as needed for moderate pain or severe pain.     No facility-administered medications prior to visit.   Allergies[1]   Objective:   Today's Vitals   09/25/24 1338  BP: 134/72  Pulse: 77  Temp: 98.1 F (36.7 C)  TempSrc: Temporal  SpO2: 95%  Weight: 250 lb (113.4 kg)  Height: 6' (1.829 m)   Body mass index is 33.91 kg/m.   General: Well developed, well nourished. No acute distress. HEENT: Normocephalic, non-traumatic. PERRL, EOMI. Conjunctiva clear. External ears normal. EAC and TMs normal   bilaterally. Nose clear without congestion or rhinorrhea. Mucous membranes moist. Oropharynx clear. Good dentition. Neck: Supple. No lymphadenopathy. No thyromegaly. Lungs: Bibasilar  wheezing with +/- rhonchi. No rales. Psych: Alert and oriented. Normal mood and affect.  Health Maintenance Due  Topic Date Due   FOOT EXAM  10/31/2019   OPHTHALMOLOGY EXAM  05/21/2022   Imaging: Chest x-ray- There is an area of streakiness in the LLL c/w atelectasis. No acute infiltrates.    Assessment & Plan:   Problem List Items Addressed This Visit   None Visit Diagnoses       Wheezing    -  Primary   In light of wheezing, I will prescribe a Medrol  dosepak and an albuterol  inhaler. Follow-up with Dr. Kennyth as needed if symptoms not resolving.   Relevant Medications   methylPREDNISolone  (MEDROL  DOSEPAK) 4 MG TBPK tablet   albuterol  (VENTOLIN  HFA) 108 (90 Base) MCG/ACT inhaler   Other Relevant Orders   DG Chest 2 View  Return if symptoms worsen or fail to improve.    Garnette CHRISTELLA Simpler, MD  I,Emily Lagle,acting as a scribe for Garnette CHRISTELLA Simpler, MD.,have documented all relevant documentation on the behalf of Garnette CHRISTELLA Simpler, MD.  I, Garnette CHRISTELLA Simpler, MD, have reviewed all documentation for this visit. The documentation on 09/25/2024 for the exam, diagnosis, procedures, and orders are all accurate and complete.     [1]  Allergies Allergen Reactions   Gabapentin  Other (See Comments)    Lethargic and depressed, aching   Lipitor [Atorvastatin Calcium ]     Muscle ache

## 2024-09-25 NOTE — Telephone Encounter (Signed)
 Please schedule an OV with PCP or any available provider if is ok with Patient

## 2024-09-25 NOTE — Telephone Encounter (Signed)
 Patient is scheduled with Dr. Thedora 09/25/24

## 2024-09-26 LAB — CBC WITH DIFFERENTIAL/PLATELET
Basophils Absolute: 0 x10E3/uL (ref 0.0–0.2)
Basos: 1 %
EOS (ABSOLUTE): 0.1 x10E3/uL (ref 0.0–0.4)
Eos: 1 %
Hematocrit: 52.4 % — ABNORMAL HIGH (ref 37.5–51.0)
Hemoglobin: 16.7 g/dL (ref 13.0–17.7)
Immature Grans (Abs): 0 x10E3/uL (ref 0.0–0.1)
Immature Granulocytes: 0 %
Lymphocytes Absolute: 0.7 x10E3/uL (ref 0.7–3.1)
Lymphs: 11 %
MCH: 30.9 pg (ref 26.6–33.0)
MCHC: 31.9 g/dL (ref 31.5–35.7)
MCV: 97 fL (ref 79–97)
Monocytes Absolute: 0.5 x10E3/uL (ref 0.1–0.9)
Monocytes: 8 %
Neutrophils Absolute: 4.7 x10E3/uL (ref 1.4–7.0)
Neutrophils: 79 %
Platelets: 139 x10E3/uL — ABNORMAL LOW (ref 150–450)
RBC: 5.41 x10E6/uL (ref 4.14–5.80)
RDW: 13.1 % (ref 11.6–15.4)
WBC: 6 x10E3/uL (ref 3.4–10.8)

## 2024-09-26 LAB — COMPREHENSIVE METABOLIC PANEL WITH GFR
ALT: 21 IU/L (ref 0–44)
AST: 30 IU/L (ref 0–40)
Albumin: 4.4 g/dL (ref 3.7–4.7)
Alkaline Phosphatase: 66 IU/L (ref 48–129)
BUN/Creatinine Ratio: 10 (ref 10–24)
BUN: 13 mg/dL (ref 8–27)
Bilirubin Total: 0.5 mg/dL (ref 0.0–1.2)
CO2: 23 mmol/L (ref 20–29)
Calcium: 9.2 mg/dL (ref 8.6–10.2)
Chloride: 103 mmol/L (ref 96–106)
Creatinine, Ser: 1.27 mg/dL (ref 0.76–1.27)
Globulin, Total: 2.3 g/dL (ref 1.5–4.5)
Glucose: 206 mg/dL — ABNORMAL HIGH (ref 70–99)
Potassium: 4.3 mmol/L (ref 3.5–5.2)
Sodium: 141 mmol/L (ref 134–144)
Total Protein: 6.7 g/dL (ref 6.0–8.5)
eGFR: 55 mL/min/1.73 — ABNORMAL LOW (ref >59)

## 2024-09-26 LAB — TSH: TSH: 2.28 u[IU]/mL (ref 0.450–4.500)

## 2024-09-29 ENCOUNTER — Ambulatory Visit: Payer: Self-pay | Admitting: Cardiology

## 2024-10-01 ENCOUNTER — Encounter: Payer: Self-pay | Admitting: Cardiology

## 2024-10-01 ENCOUNTER — Ambulatory Visit (HOSPITAL_COMMUNITY)
Admission: RE | Admit: 2024-10-01 | Discharge: 2024-10-01 | Disposition: A | Discharge status: Home / Self Care | Source: Ambulatory Visit | Attending: Cardiology | Admitting: Cardiology

## 2024-10-01 DIAGNOSIS — I4892 Unspecified atrial flutter: Secondary | ICD-10-CM | POA: Diagnosis not present

## 2024-10-01 DIAGNOSIS — I4819 Other persistent atrial fibrillation: Secondary | ICD-10-CM | POA: Diagnosis not present

## 2024-10-01 MED ORDER — IOHEXOL 350 MG/ML SOLN
100.0000 mL | Freq: Once | INTRAVENOUS | Status: AC | PRN
  Administered 2024-10-01: 100 mL via INTRAVENOUS

## 2024-10-08 ENCOUNTER — Encounter (HOSPITAL_COMMUNITY): Payer: Self-pay

## 2024-10-08 ENCOUNTER — Telehealth (HOSPITAL_COMMUNITY): Payer: Self-pay

## 2024-10-08 NOTE — Telephone Encounter (Signed)
 Attempted to reach patient to discuss upcoming procedure, no answer. Left VM for patient to return call.

## 2024-10-15 ENCOUNTER — Ambulatory Visit: Payer: Self-pay

## 2024-10-15 ENCOUNTER — Encounter: Payer: Self-pay | Admitting: Family Medicine

## 2024-10-15 NOTE — Telephone Encounter (Signed)
 Recommend office visit if he can.  X-ray will not evaluate for appendicitis or gallbladder issues.

## 2024-10-15 NOTE — Telephone Encounter (Signed)
 FYI Only or Action Required?: FYI only for provider: ED advised.  Patient was last seen in primary care on 09/25/2024 by Thedora Garnette HERO, MD.  Called Nurse Triage reporting Flank Pain.  Symptoms began yesterday.  Interventions attempted: OTC medications: topical pain patch.  Symptoms are: unchanged.  Triage Disposition: Go to ED Now (Notify PCP)  Patient/caregiver understands and will follow disposition?: No, wishes to speak with PCP  Reason for Disposition  [1] Abdominal pain AND [2] age > 60 years  Answer Assessment - Initial Assessment Questions 1. LOCATION: Where does it hurt? (e.g., left, right)     Side, R,  2. ONSET: When did the pain start?     2-3 days 3. SEVERITY: How bad is the pain? (e.g., Scale 1-10; mild, moderate, or severe)     Sitting-4, cough-8 4. PATTERN: Does the pain come and go, or is it constant?      Constant, severity increases with movement/deep breath 5. CAUSE: What do you think is causing the pain?     Denies injury 6. OTHER SYMPTOMS:  Do you have any other symptoms? (e.g., fever, abdomen pain, vomiting, leg weakness, burning with urination, blood in urine)     Denies GU s/s, denies fever,  Pt advised to go to ED, refused.  Protocols used: Flank Pain-A-AH

## 2024-10-15 NOTE — Telephone Encounter (Signed)
 Patient called in and I informed him of PCP message. Due to lack of appts for 10/17/23, I had patient sent to triage to evaluate if we can wait based off of symptoms.

## 2024-10-16 ENCOUNTER — Emergency Department (HOSPITAL_COMMUNITY)

## 2024-10-16 ENCOUNTER — Encounter (HOSPITAL_COMMUNITY): Payer: Self-pay

## 2024-10-16 ENCOUNTER — Other Ambulatory Visit: Payer: Self-pay

## 2024-10-16 ENCOUNTER — Emergency Department (HOSPITAL_COMMUNITY)
Admission: EM | Admit: 2024-10-16 | Discharge: 2024-10-17 | Disposition: A | Discharge status: Home / Self Care | Attending: Emergency Medicine | Admitting: Emergency Medicine

## 2024-10-16 DIAGNOSIS — Z79899 Other long term (current) drug therapy: Secondary | ICD-10-CM | POA: Insufficient documentation

## 2024-10-16 DIAGNOSIS — R10A1 Flank pain, right side: Secondary | ICD-10-CM

## 2024-10-16 DIAGNOSIS — Z7901 Long term (current) use of anticoagulants: Secondary | ICD-10-CM | POA: Diagnosis not present

## 2024-10-16 DIAGNOSIS — N390 Urinary tract infection, site not specified: Secondary | ICD-10-CM | POA: Insufficient documentation

## 2024-10-16 DIAGNOSIS — R109 Unspecified abdominal pain: Secondary | ICD-10-CM | POA: Diagnosis present

## 2024-10-16 LAB — COMPREHENSIVE METABOLIC PANEL WITH GFR
ALT: 20 U/L (ref 0–44)
AST: 26 U/L (ref 15–41)
Albumin: 4.3 g/dL (ref 3.5–5.0)
Alkaline Phosphatase: 70 U/L (ref 38–126)
Anion gap: 14 (ref 5–15)
BUN: 23 mg/dL (ref 8–23)
CO2: 24 mmol/L (ref 22–32)
Calcium: 9.5 mg/dL (ref 8.9–10.3)
Chloride: 99 mmol/L (ref 98–111)
Creatinine, Ser: 1.52 mg/dL — ABNORMAL HIGH (ref 0.61–1.24)
GFR, Estimated: 45 mL/min — ABNORMAL LOW
Glucose, Bld: 145 mg/dL — ABNORMAL HIGH (ref 70–99)
Potassium: 4.9 mmol/L (ref 3.5–5.1)
Sodium: 136 mmol/L (ref 135–145)
Total Bilirubin: 0.7 mg/dL (ref 0.0–1.2)
Total Protein: 7.6 g/dL (ref 6.5–8.1)

## 2024-10-16 LAB — CBC WITH DIFFERENTIAL/PLATELET
Abs Immature Granulocytes: 0.04 K/uL (ref 0.00–0.07)
Basophils Absolute: 0.1 K/uL (ref 0.0–0.1)
Basophils Relative: 1 %
Eosinophils Absolute: 0.1 K/uL (ref 0.0–0.5)
Eosinophils Relative: 1 %
HCT: 56.1 % — ABNORMAL HIGH (ref 39.0–52.0)
Hemoglobin: 17.7 g/dL — ABNORMAL HIGH (ref 13.0–17.0)
Immature Granulocytes: 0 %
Lymphocytes Relative: 11 %
Lymphs Abs: 1.1 K/uL (ref 0.7–4.0)
MCH: 30.3 pg (ref 26.0–34.0)
MCHC: 31.6 g/dL (ref 30.0–36.0)
MCV: 96.1 fL (ref 80.0–100.0)
Monocytes Absolute: 1.2 K/uL — ABNORMAL HIGH (ref 0.1–1.0)
Monocytes Relative: 12 %
Neutro Abs: 7.6 K/uL (ref 1.7–7.7)
Neutrophils Relative %: 75 %
Platelets: 194 K/uL (ref 150–400)
RBC: 5.84 MIL/uL — ABNORMAL HIGH (ref 4.22–5.81)
RDW: 13.9 % (ref 11.5–15.5)
WBC: 10.2 K/uL (ref 4.0–10.5)
nRBC: 0 % (ref 0.0–0.2)

## 2024-10-16 LAB — LIPASE, BLOOD: Lipase: 31 U/L (ref 11–51)

## 2024-10-16 LAB — URINALYSIS, ROUTINE W REFLEX MICROSCOPIC
Bilirubin Urine: NEGATIVE
Glucose, UA: NEGATIVE mg/dL
Ketones, ur: NEGATIVE mg/dL
Nitrite: POSITIVE — AB
Protein, ur: NEGATIVE mg/dL
Specific Gravity, Urine: 1.011 (ref 1.005–1.030)
pH: 5 (ref 5.0–8.0)

## 2024-10-16 MED ORDER — MORPHINE SULFATE (PF) 2 MG/ML IV SOLN
4.0000 mg | Freq: Once | INTRAVENOUS | Status: AC
  Administered 2024-10-16: 4 mg via INTRAVENOUS
  Filled 2024-10-16: qty 2

## 2024-10-16 MED ORDER — ONDANSETRON HCL 4 MG/2ML IJ SOLN
4.0000 mg | Freq: Once | INTRAMUSCULAR | Status: AC
  Administered 2024-10-16: 4 mg via INTRAVENOUS
  Filled 2024-10-16: qty 2

## 2024-10-16 MED ORDER — KETOROLAC TROMETHAMINE 30 MG/ML IJ SOLN
30.0000 mg | Freq: Once | INTRAMUSCULAR | Status: AC
  Administered 2024-10-16: 30 mg via INTRAVENOUS
  Filled 2024-10-16: qty 1

## 2024-10-16 MED ORDER — KETOROLAC TROMETHAMINE 15 MG/ML IJ SOLN: 15.0000 mg | Freq: Once | INTRAMUSCULAR | Status: DC

## 2024-10-16 NOTE — Telephone Encounter (Signed)
 Wife, Nena, calling in this afternoon that patient is in the ED at [facility] for worsening  symptoms. He needs to cancel appt tomorrow with the PCP clinic. Appt cancelled and advised wife to call back when patient is discharged to set up a follow up appt. Unsure if patient will be admitted at this time or diagnosis due to waiting for results.

## 2024-10-16 NOTE — ED Triage Notes (Signed)
 Pt reports right flank pain that started on Saturday and placed a patch on it to help with the pain with some relief. Pain worse today also feeling nauseated.

## 2024-10-16 NOTE — ED Provider Triage Note (Signed)
 Emergency Medicine Provider Triage Evaluation Note  Brent Taylor , a 84 y.o. male  was evaluated in triage.  Pt complains of right flank pain for the past 4 days.  No fall or injury.  Pain does not radiate.  Notes nausea.  No abdominal pain.  No history of kidney stones.  Denies fevers  Review of Systems  Positive: Flank pain, nausea Negative: Fevers, abdominal pain, vomiting, diarrhea, urinary symptoms  Physical Exam  BP (!) 145/73   Pulse 90   Temp 97.7 F (36.5 C)   Resp 18   Wt 110.2 kg   SpO2 95%   BMI 32.96 kg/m  Gen:   Awake, no distress   Resp:  Normal effort  MSK:   Moves extremities without difficulty  Other:    Medical Decision Making  Medically screening exam initiated at 1:52 PM.  Appropriate orders placed.  Brent Taylor was informed that the remainder of the evaluation will be completed by another provider, this initial triage assessment does not replace that evaluation, and the importance of remaining in the ED until their evaluation is complete.     Neysa Thersia RAMAN, [place] 10/16/24 1353

## 2024-10-16 NOTE — Telephone Encounter (Signed)
 Noted, patient has an appt on 10/17/2024

## 2024-10-16 NOTE — ED Triage Notes (Signed)
 POV/ ambulatory/ c/o right flank pain since Sunday/ sent by PCP

## 2024-10-17 ENCOUNTER — Encounter: Payer: Self-pay | Admitting: Cardiology

## 2024-10-17 ENCOUNTER — Ambulatory Visit: Admitting: Family

## 2024-10-17 MED ORDER — CEPHALEXIN 500 MG PO CAPS: 500.0000 mg | ORAL_CAPSULE | Freq: Four times a day (QID) | ORAL | 0 refills | Status: DC

## 2024-10-17 MED ORDER — HYDROCODONE-ACETAMINOPHEN 5-325 MG PO TABS: 1.0000 | ORAL_TABLET | ORAL | 0 refills | Status: DC | PRN

## 2024-10-17 MED ORDER — ONDANSETRON 4 MG PO TBDP: 4.0000 mg | ORAL_TABLET | Freq: Three times a day (TID) | ORAL | 0 refills | Status: DC | PRN

## 2024-10-17 NOTE — ED Provider Notes (Signed)
 " Brent Taylor EMERGENCY DEPARTMENT AT [facility] Provider Note   CSN: [id number] Arrival date & time: 10/16/24  1223     Patient presents with: Flank Pain   Brent Taylor is a 84 y.o. male.   Patient presents to ED for evaluation of right flank pain that started 4 days ago. No injury or fall. No fever, nausea, vomiting, urinary symptoms or groin pain. No history of kidney stones. He is currently pain free using OTC patches and Tylenol .   The history is provided by the patient. No language interpreter was used.  Flank Pain       Prior to Admission medications  Medication Sig Start Date End Date Taking? Authorizing Provider  cephALEXin  (KEFLEX ) 500 MG capsule Take 1 capsule (500 mg total) by mouth 4 (four) times daily. 10/17/24  Yes Virat Prather, Margit, PA-C  HYDROcodone -acetaminophen  (NORCO/VICODIN) 5-325 MG tablet Take 1 tablet by mouth every 4 (four) hours as needed for severe pain (pain score 7-10). 10/17/24  Yes Clemma Johnsen, Margit, PA-C  ondansetron  (ZOFRAN -ODT) 4 MG disintegrating tablet Take 1 tablet (4 mg total) by mouth every 8 (eight) hours as needed for nausea or vomiting. 10/17/24  Yes Davied Nocito, Margit, PA-C  acetaminophen  (TYLENOL ) 650 MG CR tablet Take 650-1,300 mg by mouth daily as needed for pain.    [provider]  albuterol  (VENTOLIN  HFA) 108 (90 Base) MCG/ACT inhaler Inhale 2 puffs into the lungs every 6 (six) hours as needed for wheezing or shortness of breath. 05/08/24   [name], DO  albuterol  (VENTOLIN  HFA) 108 (90 Base) MCG/ACT inhaler Inhale 2 puffs into the lungs every 6 (six) hours as needed for wheezing or shortness of breath. 09/25/24   Thedora Garnette HERO, MD  amiodarone  (PACERONE ) 200 MG tablet Take 2 tablets (400 mg total) by mouth 2 (two) times daily for 7 days, THEN 1 tablet (200 mg total) 2 (two) times daily for 7 days, THEN 1 tablet (200 mg total) daily. 05/21/24 05/30/25  Kennyth Chew, MD  Continuous Glucose Sensor (FREESTYLE LIBRE 3 SENSOR) MISC  Place 1 sensor on the skin Q14 days. Use to check glucose continuously Code E11.6 12/27/23   [name], MD  cyanocobalamin  (VITAMIN B12) 1000 MCG tablet Take 1,000 mcg by mouth once a week.    [provider]  ELIQUIS  5 MG TABS tablet TAKE 1 TABLET BY MOUTH 2 TIMES A DAY 05/11/24   [name], [name], MD  FLUoxetine  (PROZAC ) 40 MG capsule TAKE 1 CAPSULE BY MOUTH DAILY Patient not taking: Reported on 09/25/2024 06/27/24   [name], MD  fluticasone (FLONASE) 50 MCG/ACT nasal spray Place 1 spray into both nostrils daily as needed for allergies or rhinitis.    [provider]  furosemide  (LASIX ) 20 MG tablet Take 1 tablet (20 mg total) by mouth daily. 01/27/24   Fenton, [name], PA  losartan  (COZAAR ) 25 MG tablet Take 1 tablet (25 mg total) by mouth daily. 08/22/24   [name], Stanly A, MD  methylPREDNISolone  (MEDROL  DOSEPAK) 4 MG TBPK tablet Take per package instructions. 09/25/24   Thedora Garnette HERO, MD  pantoprazole  (PROTONIX ) 40 MG tablet Take 1 tablet (40 mg total) by mouth daily. Office visit for further refills 09/10/24   Abran Norleen SAILOR, MD  pravastatin  (PRAVACHOL ) 80 MG tablet TAKE ONE TABLET BY MOUTH EVERY EVENING 07/13/24   [name], [name], MD  traMADol  (ULTRAM ) 50 MG tablet Take 50 mg by mouth daily as needed for moderate pain or severe pain.  11/18/22   [provider]    Allergies: Gabapentin  and Lipitor [atorvastatin calcium ]    Review of Systems  Genitourinary:  Positive for flank pain.    Updated Vital Signs BP (!) 140/81 (BP Location: Left Arm)   Pulse 64   Temp 98.7 F (37.1 C) (Oral)   Resp 20   Wt 110.2 kg   SpO2 96%   BMI 32.96 kg/m   Physical Exam Vitals and nursing note reviewed.  Constitutional:      Appearance: He is well-developed.  HENT:     Head: Normocephalic.  Cardiovascular:     Rate and Rhythm: Normal rate.  Pulmonary:     Effort: Pulmonary effort is normal.     Breath sounds: Normal breath sounds. No  wheezing, rhonchi or rales.  Abdominal:     General: There is no distension.     Palpations: Abdomen is soft.     Tenderness: There is no abdominal tenderness. There is no right CVA tenderness, guarding or rebound.  Musculoskeletal:        General: Normal range of motion.     Cervical back: Normal range of motion and neck supple.  Skin:    General: Skin is warm and dry.  Neurological:     General: No focal deficit present.     Mental Status: He is alert and oriented to person, place, and time.     (all labs ordered are listed, but only abnormal results are displayed) Labs Reviewed  CBC WITH DIFFERENTIAL/PLATELET - Abnormal; Notable for the following components:      Result Value   RBC 5.84 (*)    Hemoglobin 17.7 (*)    HCT 56.1 (*)    Monocytes Absolute 1.2 (*)    All other components within normal limits  URINALYSIS, ROUTINE W REFLEX MICROSCOPIC - Abnormal; Notable for the following components:   APPearance HAZY (*)    Hgb urine dipstick SMALL (*)    Nitrite POSITIVE (*)    Leukocytes,Ua LARGE (*)    Bacteria, UA MANY (*)    All other components within normal limits  COMPREHENSIVE METABOLIC PANEL WITH GFR - Abnormal; Notable for the following components:   Glucose, Bld 145 (*)    Creatinine, Ser 1.52 (*)    GFR, Estimated 45 (*)    All other components within normal limits  URINE CULTURE  LIPASE, BLOOD    EKG: None  Radiology: CT Renal Stone Study Result Date: 10/16/2024 CLINICAL DATA:  Right flank pain for 4 days EXAM: CT ABDOMEN AND PELVIS WITHOUT CONTRAST TECHNIQUE: Multidetector CT imaging of the abdomen and pelvis was performed following the standard protocol without IV contrast. RADIATION DOSE REDUCTION: This exam was performed according to the departmental dose-optimization program which includes automated exposure control, adjustment of the mA and/or kV according to patient size and/or use of iterative reconstruction technique. COMPARISON:  03/29/2022 FINDINGS:  Lower chest: No acute pleural or parenchymal lung disease. Stable 7 mm right lower lobe pulmonary nodule, reference image 11/2. Moderate hiatal hernia. Hepatobiliary: Small gallstones layering dependently within the gallbladder. No evidence of acute cholecystitis. Unremarkable unenhanced appearance of the liver. No biliary duct dilation. Pancreas: Unremarkable unenhanced appearance. Spleen: Unremarkable unenhanced appearance. Adrenals/Urinary Tract: No urinary tract calculi or obstructive uropathy within either kidney. The adrenals and bladder are unremarkable. Stomach/Bowel: No bowel obstruction or ileus. Normal appendix right mid abdomen. Scattered colonic diverticulosis without evidence of acute diverticulitis. There is nonspecific fat stranding surrounding the ascending colon just proximal to the  hepatic flexure, with no associated bowel wall thickening. Vascular/Lymphatic: Aortic atherosclerosis. No enlarged abdominal or pelvic lymph nodes. Reproductive: Prostate is unremarkable. Other: No free fluid or free intraperitoneal gas. Stable small left lower quadrant ventral wall hernia containing a portion of the sigmoid colon. No incarceration or obstruction. Musculoskeletal: No acute or destructive bony abnormalities. Reconstructed images demonstrate no additional findings. IMPRESSION: 1. No urinary tract calculi or obstructive uropathy within either kidney. 2. Nonspecific pericolonic fat stranding just proximal to the hepatic flexure, with no associated bowel wall thickening. Findings are equivocal for colitis. 3. Cholelithiasis without evidence of acute cholecystitis. 4. Stable 7 mm right lower lobe pulmonary nodule, which can be considered benign given long-term stability in size. No specific imaging follow-up recommended. 5. Stable left lower quadrant ventral wall hernia containing a small portion of sigmoid colon. No incarceration or obstruction. 6. Colonic diverticulosis without diverticulitis. 7. Moderate  hiatal hernia. 8.  Aortic Atherosclerosis (ICD10-I70.0). Electronically Signed   By: Ozell Daring M.D.   On: 10/16/2024 17:10     Procedures   Medications Ordered in the ED  morphine  (PF) 2 MG/ML injection 4 mg (4 mg Intravenous Given 10/16/24 1408)  ondansetron  (ZOFRAN ) injection 4 mg (4 mg Intravenous Given 10/16/24 1409)  ketorolac  (TORADOL ) 30 MG/ML injection 30 mg (30 mg Intravenous Given 10/16/24 1620)    Clinical Course as of 10/17/24 0446  Wed Oct 17, 2024  0428 Patient to ED with right flank pain x 4 days, intermittent. No fever. Very well appearing, currently asymptomatic. Exam is benign with no abdominal tenderness and no reproducible flank tenderness. VSS, afebrile. UA c/w infection. CT renal obtained and, per radiology: IMPRESSION: 1. No urinary tract calculi or obstructive uropathy within either kidney. 2. Nonspecific pericolonic fat stranding just proximal to the hepatic flexure, with no associated bowel wall thickening. Findings are equivocal for colitis. 3. Cholelithiasis without evidence of acute cholecystitis. 4. Stable 7 mm right lower lobe pulmonary nodule, which can be considered benign given long-term stability in size. No specific imaging follow-up recommended. 5. Stable left lower quadrant ventral wall hernia containing a small portion of sigmoid colon. No incarceration or obstruction. 6. Colonic diverticulosis without diverticulitis. 7. Moderate hiatal hernia. 8.  Aortic Atherosclerosis (ICD10-I70.0).  Urine sent for culture. Favor treating with abx, no renal dosing indicated with CrCl 58. Patient felt appropriate for discharge home.  [SU]    Clinical Course User Index [SU] Odell Balls, PA-C                                 Medical Decision Making       Final diagnoses:  Right flank pain  Urinary tract infection without hematuria, site unspecified    ED Discharge Orders          Ordered    cephALEXin  (KEFLEX ) 500 MG capsule  4 times daily         10/17/24 0443    ondansetron  (ZOFRAN -ODT) 4 MG disintegrating tablet  Every 8 hours PRN        10/17/24 0443    HYDROcodone -acetaminophen  (NORCO/VICODIN) 5-325 MG tablet  Every 4 hours PRN        10/17/24 0443               Odell Balls, PA-C 10/17/24 0446    Griselda Norris, MD 10/17/24 0530  "

## 2024-10-17 NOTE — Discharge Instructions (Addendum)
 As we discussed, take the antibiotic as prescribed. Prescriptions for pain and nausea medications have also been provided. Follow up with your doctor for recheck in 2-3 days and to review urine culture that was sent tonight.   Return to the ED with any uncontrolled pain or vomiting, high fever or for new concern.   Thank you for your patience tonight.

## 2024-10-18 NOTE — Telephone Encounter (Signed)
 I reviewed his records. The urine culture is not back yet but should be back by tomorrow hopefully.  The cephalexin  is a good choice for urinary tract infection though we are still waiting on the final results.  It is fine for him to proceed with the ablation.

## 2024-10-18 NOTE — Telephone Encounter (Signed)
 See note

## 2024-10-19 ENCOUNTER — Encounter: Payer: Self-pay | Admitting: Family Medicine

## 2024-10-19 LAB — URINE CULTURE: Culture: >=100000 — AB

## 2024-10-19 NOTE — Telephone Encounter (Signed)
 Please advise

## 2024-10-20 ENCOUNTER — Telehealth (HOSPITAL_BASED_OUTPATIENT_CLINIC_OR_DEPARTMENT_OTHER): Payer: Self-pay | Admitting: *Deleted

## 2024-10-20 NOTE — Telephone Encounter (Signed)
 Post ED Visit - Positive Culture Follow-up  Culture report reviewed by antimicrobial stewardship pharmacist: [facility] []  [place]in [place], Pharm.D. []  Venetia Gully, [address].D., BCPS AQ-ID []  Garrel Crews, Pharm.D., BCPS []  Almarie Lunger, Pharm.D., BCPS []  [place], [address].D., BCPS, AAHIVP []  Rosaline Bihari, Pharm.D., BCPS, AAHIVP []  Vernell Meier, PharmD, BCPS []  Latanya Hint, PharmD, BCPS []  Donald Medley, PharmD, BCPS []  Rocky Bold, PharmD []  Dorothyann Alert, PharmD, BCPS [x]  Dorn Poot, PharmD  [facility] []  Rosaline Edison, PharmD []  Romona Bliss, PharmD []  Dolphus Roller, PharmD []  Veva Seip, Rph []  Vernell Daunt) Leonce, PharmD []  Eva Allis, PharmD []  Rosaline Millet, PharmD []  Iantha Batch, PharmD []  Arvin Gauss, PharmD []  Wanda Hasting, PharmD []  Ronal Rav, PharmD []  Rocky Slade, PharmD []  Bard Jeans, PharmD   Positive urine culture Treated with Cephalexin , organism sensitive to the same and no further patient follow-up is required at this time.  Albino Alan Novak 10/20/2024, 11:03 AM

## 2024-10-22 ENCOUNTER — Telehealth: Payer: Self-pay

## 2024-10-22 ENCOUNTER — Telehealth: Payer: Self-pay | Admitting: Family Medicine

## 2024-10-22 NOTE — Telephone Encounter (Signed)
 Spoke with patient to discuss upcoming procedure.   CT: completed.  Labs: completed.   Any recent signs of acute illness or been started on antibiotics? Patient was recently treated for a UTI. He completed course of antibiotics yesterday and reports the only symptoms remain is mild soreness in right side and hip/groin area, causing some discomfort to lift his right leg.  He is unsure if related to muscle strain. He denies any recent lifting or strenuous activity. Reports he provided a repeat urine for culture on today, but told results will not be available until around Jan 14, the day before procedure.  Any missed doses of blood thinner?  No  Advised patient to continue taking Eliquis  (Apixaban ) twice daily without missing any doses. Any medications to hold?  routine Medication instructions:  On the morning of your procedure DO NOT take any medication., including Eliquis  (Apixaban ) or the procedure may be rescheduled. Nothing to eat or drink after midnight prior to your procedure.  Confirmed patient is scheduled for Atrial Fibrillation Ablation, Aflutter Ablation on Thursday, January 15 with Dr. Kennyth. Instructed patient to arrive at the Main Entrance A at [facility]: [address] [place], [place] [address] and check in at Admitting at 7:30 AM.   Plan to go home the same day, you will only stay overnight if medically necessary. You MUST have a responsible adult to drive you home and MUST be with you the first 24 hours after you arrive home or your procedure could be cancelled.  Informed patient a nurse will call a day before the procedure to confirm arrival time and ensure instructions are followed.  Patient verbalized understanding to all instructions provided and agreed to proceed with procedure.   Advised patient to contact RN Navigator at [phone], to inform of any new medications started after call or concerns prior to procedure.

## 2024-10-22 NOTE — Telephone Encounter (Signed)
 Copied from CRM [id number]. Topic: Appointments - Scheduling Inquiry for Clinic >> Oct 22, 2024  9:30 AM Eva FALCON wrote: Reason for CRM: Pt is scheduled for heart surgery on Thursday, he went to hospital last week and had a UTI is almost done with the medication, is wanting to drop a sample off to make sure he is good for his surgery, was advise he is needing a hospital follow up but nothing until Friday and patients surgery is Thursday, is requesting a call back today [phone].

## 2024-10-22 NOTE — Telephone Encounter (Signed)
 Pt stated they went to UC to check for UTI. Stated will call back at a later time to schedule with Kennyth due to surgery this Thursday, 10/25/24

## 2024-10-22 NOTE — Telephone Encounter (Signed)
 Called pt but call dropped. LVM to schedule HFU asap. Please transfer to [employer] to schedule.

## 2024-10-22 NOTE — Telephone Encounter (Signed)
 Left message to return call to our office at their convenience.

## 2024-10-22 NOTE — Telephone Encounter (Signed)
 Yes we can have him come in for urine sample, especially if he is still having symptoms.   Also it may be time to think about urology referral for recurrent urinary tract infection if he is interested.

## 2024-10-22 NOTE — Telephone Encounter (Signed)
 Copied from CRM #[id number]. Topic: General - Call Back - No Documentation >> Oct 22, 2024  3:08 PM Antony RAMAN wrote: Reason for CRM: returning a call to [facility], please call back   Called and spoke to patient and he expressed that he went to Urgent care today and they completed a send out urinalysis. Patient is currently awaiting result. Patient expressed that he got a CT scan at the ED due to hip pain. CT concluded no evidence of gallstones.  ED physician expressed that UTI can cause pain and sharp pains down the leg as patient complained initially. Patient is scheduled for surgery if UTI is cleared up. Patient was offered appointment with Dr. Kennyth but refused due to scheduled surgery being the day before. ED physician gave antibiotics that was completed this morning. Urgent care physician expressed that he thought that urine was clear dur to antibiotic use.  Patient will have send out results hopefully tomorrow. Patient wanted to have Dr. Kennyth informed.

## 2024-10-23 NOTE — Telephone Encounter (Signed)
 Noted. He should let us  know if symptoms are not improving.

## 2024-10-24 ENCOUNTER — Other Ambulatory Visit: Payer: Self-pay | Admitting: *Deleted

## 2024-10-24 ENCOUNTER — Telehealth: Payer: Self-pay | Admitting: Cardiology

## 2024-10-24 DIAGNOSIS — N390 Urinary tract infection, site not specified: Secondary | ICD-10-CM

## 2024-10-24 NOTE — Telephone Encounter (Signed)
 Spoke with patient. Stated is waiting for result of urine culture from UC  If no changes will schedule a lab appt Future lab order

## 2024-10-24 NOTE — Telephone Encounter (Signed)
 Patient is calling to speak with someone from Dr Carroll care team regarding his procedure tomorrow 10/25/24. He has questions. Please advise.

## 2024-10-24 NOTE — Pre-Procedure Instructions (Signed)
 Attempted to call patient regarding procedure instructions for tomorrow.  Left voicemail on the following items: Arrival time 0730 Nothing to eat or drink after midnight No meds AM of procedure Responsible person to drive you home and stay with you for 24 hrs  Have you missed any doses of anti-coagulant Eliquis - should be taken twice a day, if you have missed any doses please let us  know.  Don't take dose morning of procedure.

## 2024-10-24 NOTE — Telephone Encounter (Signed)
 Spoke with the patient who wanted to give an update that he stopped his antibiotics for his UTI this past Sunday. He is feeling good and symptoms have all resolved. They did do a culture at his follow up appointment on Monday but he is still waiting for the results and wanted to make sure Dr. Kennyth was aware of this.

## 2024-10-25 ENCOUNTER — Ambulatory Visit (HOSPITAL_COMMUNITY): Admission: RE | Disposition: A | Payer: Self-pay | Source: Home / Self Care | Attending: Cardiology

## 2024-10-25 ENCOUNTER — Ambulatory Visit (HOSPITAL_COMMUNITY): Admitting: Anesthesiology

## 2024-10-25 ENCOUNTER — Ambulatory Visit (HOSPITAL_COMMUNITY)
Admission: RE | Admit: 2024-10-25 | Discharge: 2024-10-25 | Disposition: A | Attending: Cardiology | Admitting: Cardiology

## 2024-10-25 ENCOUNTER — Other Ambulatory Visit: Payer: Self-pay

## 2024-10-25 DIAGNOSIS — G4733 Obstructive sleep apnea (adult) (pediatric): Secondary | ICD-10-CM | POA: Insufficient documentation

## 2024-10-25 DIAGNOSIS — I509 Heart failure, unspecified: Secondary | ICD-10-CM

## 2024-10-25 DIAGNOSIS — I5020 Unspecified systolic (congestive) heart failure: Secondary | ICD-10-CM | POA: Diagnosis not present

## 2024-10-25 DIAGNOSIS — Z87891 Personal history of nicotine dependence: Secondary | ICD-10-CM | POA: Insufficient documentation

## 2024-10-25 DIAGNOSIS — Z7901 Long term (current) use of anticoagulants: Secondary | ICD-10-CM | POA: Insufficient documentation

## 2024-10-25 DIAGNOSIS — I110 Hypertensive heart disease with heart failure: Secondary | ICD-10-CM | POA: Diagnosis not present

## 2024-10-25 DIAGNOSIS — D6869 Other thrombophilia: Secondary | ICD-10-CM | POA: Insufficient documentation

## 2024-10-25 DIAGNOSIS — I4819 Other persistent atrial fibrillation: Secondary | ICD-10-CM

## 2024-10-25 DIAGNOSIS — I4892 Unspecified atrial flutter: Secondary | ICD-10-CM

## 2024-10-25 DIAGNOSIS — I4891 Unspecified atrial fibrillation: Secondary | ICD-10-CM

## 2024-10-25 DIAGNOSIS — I2510 Atherosclerotic heart disease of native coronary artery without angina pectoris: Secondary | ICD-10-CM

## 2024-10-25 DIAGNOSIS — Z79899 Other long term (current) drug therapy: Secondary | ICD-10-CM | POA: Diagnosis not present

## 2024-10-25 DIAGNOSIS — E119 Type 2 diabetes mellitus without complications: Secondary | ICD-10-CM | POA: Diagnosis not present

## 2024-10-25 DIAGNOSIS — K219 Gastro-esophageal reflux disease without esophagitis: Secondary | ICD-10-CM | POA: Diagnosis not present

## 2024-10-25 HISTORY — PX: A-FLUTTER ABLATION: EP1230

## 2024-10-25 HISTORY — PX: ATRIAL FIBRILLATION ABLATION: EP1191

## 2024-10-25 LAB — GLUCOSE, CAPILLARY
Glucose-Capillary: 139 mg/dL — ABNORMAL HIGH (ref 70–99)
Glucose-Capillary: 188 mg/dL — ABNORMAL HIGH (ref 70–99)

## 2024-10-25 LAB — POCT ACTIVATED CLOTTING TIME
Activated Clotting Time: 342 s
Activated Clotting Time: 312 s

## 2024-10-25 MED ORDER — FENTANYL CITRATE (PF) 100 MCG/2ML IJ SOLN
INTRAMUSCULAR | Status: DC | PRN
  Administered 2024-10-25 (×2): 50 ug via INTRAVENOUS

## 2024-10-25 MED ORDER — HEPARIN SODIUM (PORCINE) 1000 UNIT/ML IJ SOLN
INTRAMUSCULAR | Status: DC | PRN
  Administered 2024-10-25 (×2): 1000 [IU] via INTRAVENOUS
  Administered 2024-10-25: 12000 [IU] via INTRAVENOUS
  Administered 2024-10-25: 5000 [IU] via INTRAVENOUS

## 2024-10-25 MED ORDER — HEPARIN SODIUM (PORCINE) 1000 UNIT/ML IJ SOLN
INTRAMUSCULAR | Status: DC | PRN
  Administered 2024-10-25: 1000 [IU] via INTRAVENOUS

## 2024-10-25 MED ORDER — PHENYLEPHRINE 80 MCG/ML (10ML) SYRINGE FOR IV PUSH (FOR BLOOD PRESSURE SUPPORT)
PREFILLED_SYRINGE | INTRAVENOUS | Status: DC | PRN
  Administered 2024-10-25: 80 ug via INTRAVENOUS
  Administered 2024-10-25: 160 ug via INTRAVENOUS

## 2024-10-25 MED ORDER — FENTANYL CITRATE (PF) 100 MCG/2ML IJ SOLN: 25.0000 ug | INTRAMUSCULAR | Status: DC | PRN

## 2024-10-25 MED ORDER — PHENYLEPHRINE HCL-NACL 20-0.9 MG/250ML-% IV SOLN
INTRAVENOUS | Status: DC | PRN
  Administered 2024-10-25: 40 ug/min via INTRAVENOUS

## 2024-10-25 MED ORDER — DEXAMETHASONE SOD PHOSPHATE PF 10 MG/ML IJ SOLN
INTRAMUSCULAR | Status: DC | PRN
  Administered 2024-10-25: 5 mg via INTRAVENOUS

## 2024-10-25 MED ORDER — SODIUM CHLORIDE 0.9 % IV SOLN: INTRAVENOUS | Status: DC

## 2024-10-25 MED ORDER — SODIUM CHLORIDE 0.9% FLUSH: 3.0000 mL | INTRAVENOUS | Status: DC | PRN

## 2024-10-25 MED ORDER — ACETAMINOPHEN 325 MG PO TABS: 650.0000 mg | ORAL_TABLET | ORAL | Status: DC | PRN

## 2024-10-25 MED ORDER — SUGAMMADEX SODIUM 200 MG/2ML IV SOLN
INTRAVENOUS | Status: DC | PRN
  Administered 2024-10-25: 220 mg via INTRAVENOUS

## 2024-10-25 MED ORDER — APIXABAN 5 MG PO TABS
5.0000 mg | ORAL_TABLET | Freq: Once | ORAL | Status: AC
  Administered 2024-10-25: 5 mg via ORAL
  Filled 2024-10-25: qty 1

## 2024-10-25 MED ORDER — ONDANSETRON HCL 4 MG/2ML IJ SOLN
INTRAMUSCULAR | Status: DC | PRN
  Administered 2024-10-25: 4 mg via INTRAVENOUS

## 2024-10-25 MED ORDER — LIDOCAINE 2% (20 MG/ML) 5 ML SYRINGE
INTRAMUSCULAR | Status: DC | PRN
  Administered 2024-10-25: 100 mg via INTRAVENOUS

## 2024-10-25 MED ORDER — FENTANYL CITRATE (PF) 100 MCG/2ML IJ SOLN
INTRAMUSCULAR | Status: AC
  Filled 2024-10-25: qty 2

## 2024-10-25 MED ORDER — ATROPINE SULFATE 1 MG/10ML IJ SOSY
PREFILLED_SYRINGE | INTRAMUSCULAR | Status: AC
  Filled 2024-10-25: qty 10

## 2024-10-25 MED ORDER — ROCURONIUM BROMIDE 10 MG/ML (PF) SYRINGE
PREFILLED_SYRINGE | INTRAVENOUS | Status: DC | PRN
  Administered 2024-10-25: 50 mg via INTRAVENOUS
  Administered 2024-10-25: 10 mg via INTRAVENOUS

## 2024-10-25 MED ORDER — PROPOFOL 10 MG/ML IV BOLUS
INTRAVENOUS | Status: DC | PRN
  Administered 2024-10-25: 130 mg via INTRAVENOUS

## 2024-10-25 MED ORDER — HEPARIN (PORCINE) IN NACL 1000-0.9 UT/500ML-% IV SOLN
INTRAVENOUS | Status: DC | PRN
  Administered 2024-10-25 (×3): 500 mL

## 2024-10-25 MED ORDER — FENTANYL CITRATE (PF) 100 MCG/2ML IJ SOLN
INTRAMUSCULAR | Status: AC
  Administered 2024-10-25: 50 ug via INTRAVENOUS
  Filled 2024-10-25: qty 2

## 2024-10-25 MED ORDER — CEFAZOLIN SODIUM-DEXTROSE 2-3 GM-%(50ML) IV SOLR
INTRAVENOUS | Status: DC | PRN
  Administered 2024-10-25: 2 g via INTRAVENOUS

## 2024-10-25 MED ORDER — ONDANSETRON HCL 4 MG/2ML IJ SOLN: 4.0000 mg | Freq: Four times a day (QID) | INTRAMUSCULAR | Status: DC | PRN

## 2024-10-25 MED ORDER — CEFAZOLIN SODIUM-DEXTROSE 2-4 GM/100ML-% IV SOLN
INTRAVENOUS | Status: AC
  Filled 2024-10-25: qty 100

## 2024-10-25 MED ORDER — SODIUM CHLORIDE 0.9% FLUSH: 3.0000 mL | Freq: Two times a day (BID) | INTRAVENOUS | Status: DC

## 2024-10-25 MED ORDER — HEPARIN SODIUM (PORCINE) 1000 UNIT/ML IJ SOLN
INTRAMUSCULAR | Status: AC
  Filled 2024-10-25: qty 10

## 2024-10-25 MED ORDER — ATROPINE SULFATE 1 MG/ML IV SOLN
INTRAVENOUS | Status: DC | PRN
  Administered 2024-10-25: 1 mg via INTRAVENOUS

## 2024-10-25 MED ORDER — SODIUM CHLORIDE 0.9 % IV SOLN: 250.0000 mL | INTRAVENOUS | Status: DC | PRN

## 2024-10-25 MED ORDER — SUCCINYLCHOLINE CHLORIDE 200 MG/10ML IV SOSY
PREFILLED_SYRINGE | INTRAVENOUS | Status: DC | PRN
  Administered 2024-10-25: 25 mg via INTRAVENOUS
  Administered 2024-10-25: 15 mg via INTRAVENOUS

## 2024-10-25 MED ORDER — EPHEDRINE SULFATE-NACL 50-0.9 MG/10ML-% IV SOSY
PREFILLED_SYRINGE | INTRAVENOUS | Status: DC | PRN
  Administered 2024-10-25: 10 mg via INTRAVENOUS
  Administered 2024-10-25: 5 mg via INTRAVENOUS

## 2024-10-25 NOTE — Anesthesia Postprocedure Evaluation (Signed)
"   Anesthesia Post Note  Patient: Brent Taylor  Procedure(s) Performed: ATRIAL FIBRILLATION ABLATION A-FLUTTER ABLATION     Patient location during evaluation: PACU Anesthesia Type: General Level of consciousness: awake and alert Pain management: pain level controlled Vital Signs Assessment: post-procedure vital signs reviewed and stable Respiratory status: spontaneous breathing, nonlabored ventilation, respiratory function stable and patient connected to nasal cannula oxygen  Cardiovascular status: blood pressure returned to baseline and stable Postop Assessment: no apparent nausea or vomiting Anesthetic complications: no   There were no known notable events for this encounter.  Last Vitals:  Vitals:   10/25/24 1240 10/25/24 1245  BP: 137/86 (!) 146/92  Pulse: 69 69  Resp: 17 17  Temp:    SpO2: 91% 92%    Last Pain:  Vitals:   10/25/24 1233  TempSrc:   PainSc: 7                  Rome Ade      "

## 2024-10-25 NOTE — H&P (Signed)
 "  Electrophysiology Note:   Date:  10/25/24  ID:  Brent Taylor, DOB 09-16-1941, MRN [id number]   Primary Cardiologist: Stanly DELENA Leavens, MD Electrophysiologist: Fonda Kitty, MD       History of Present Illness:   Brent Taylor is a 84 y.o. male with h/o PAF, CAD in [place] in 2007 with moderate disease NOS, HLD, OSA and incidental mild aortic dilation (4.0 cm) who is being seen today for evaluation for catheter ablation at the request of Dr. Leavens.   Discussed the use of AI scribe software for clinical note transcription with the patient, who gave verbal consent to proceed.   History of Present Illness Brent Taylor is an 84 year old male with atrial fibrillation who presents for follow-up regarding his heart rhythm management.   He reports a more regular heart rhythm, increased energy, improved ability to work, and less fatigue since starting amiodarone . No major breathing difficulties unless very active.   Previously, his heart monitor showed atrial fibrillation 80% of the time, but recent monitoring indicates a reduction to 4%. He checks his heart rhythm once or twice daily and has not observed any atrial fibrillation in several days.   He has noticed a slight weight gain and questions whether it might be related to amiodarone . He has not experienced any gastrointestinal discomfort. He is currently taking amiodarone  and Eliquis .   Interval: Patient presents today for planned ablation. Reports feeling relatively well. Had UTI 10 days ago. Treated with antibiotics, completed course. Reports feeling much improved. No fevers, chills, flank pain, dysuria. Urine now clear. No new or acute complaints.    Review of systems complete and found to be negative unless listed in HPI.    EP Information / Studies Reviewed:      EKG Interpretation Date/Time:                  Tuesday August 28 2024 09:55:14 EST Ventricular Rate:         75 PR Interval:                 242 QRS Duration:              118 QT Interval:                 430 QTC Calculation:480 R Axis:                         -50   Text Interpretation:Sinus rhythm with 1st degree A-V block with Premature atrial complexes with Abberant conduction Left anterior fascicular block Left ventricular hypertrophy with QRS widening ( R in aVL , Cornell product , Romhilt-Estes ) Possible Lateral infarct (cited on or before 03-Jul-2024) When compared with ECG of 03-Jul-2024 09:14, Sinus rhythm has replaced Atrial flutter Confirmed by Kitty Fonda [phone]) on 08/29/2024 9:44:35 AM    Zio 07/26/24:  Patch Wear Time:  13 days and 20 hours (2025-09-26T12:17:51-0400 to 2025-10-10T08:39:59-0400)   HR 49 - 176, average 71 bpm. Frequent SVT episodes, longest 10 mins and 6 seconds. 1 nonsustained VT episode, lasting 5 beats. Atrial flutter detected. Burden 4%, longest episode 13 hours and 19 mins. Frequent supraventricular ectopy, 6.0%. Rare ventricular ectopy. Symptom trigger episodes correspond to sinus with ectopy.     Nuclear Stress 05/18/24:    Findings are consistent with infarction and no ischemia. The study is intermediate risk. based on EF   No ST deviation was noted.   LV perfusion is  abnormal. There is no evidence of ischemia. There is evidence of infarction. Defect 1: There is a large defect with moderate reduction in uptake present in the mid to basal inferior and inferolateral location(s) that is fixed. There is abnormal wall motion in the defect area. Consistent with infarction.   Left ventricular function is abnormal. Global function is moderately reduced. There was a single regional abnormality. Nuclear stress EF: 42%. The left ventricular ejection fraction is moderately decreased (30-44%). End diastolic cavity size is moderately enlarged. End systolic cavity size is moderately enlarged.   CT images were obtained for attenuation correction and were examined for the presence of coronary calcium  when appropriate.   Coronary  calcium  was present on the attenuation correction CT images. Moderate coronary calcifications were present. Coronary calcifications were present in the left anterior descending artery, left circumflex artery and right coronary artery distribution(s).   Prior study not available for comparison.   Zio 05/2023:    Predominant underlying rhythm was atrial fibrillation (100% burden).   Several runs of NSVT, longest 11 beats.  May represent aberrant conduction.   Isolated PVCs were occasional (4.9%).   Triggered and diary events associated with atrial fibrillation.   Echo 12/2022:   1. Left ventricular ejection fraction, by estimation, is 50 to 55%. The  left ventricle has low normal function. The left ventricle has no regional  wall motion abnormalities. There is mild concentric left ventricular  hypertrophy. Left ventricular  diastolic parameters are indeterminate.   2. Right ventricular systolic function is normal. The right ventricular  size is normal. There is normal pulmonary artery systolic pressure.   3. Left atrial size was severely dilated.   4. Right atrial size was severely dilated.   5. The mitral valve is normal in structure. Mild mitral valve  regurgitation. No evidence of mitral stenosis.   6. The aortic valve is tricuspid. Aortic valve regurgitation is not  visualized. No aortic stenosis is present.   7. Aortic dilatation noted. There is moderate dilatation of the ascending  aorta, measuring 46 mm.   8. The inferior vena cava is normal in size with greater than 50%  respiratory variability, suggesting right atrial pressure of 3 mmHg.    Risk Assessment/Calculations:     CHA2DS2-VASc Score = 5   This indicates a 7.2% annual risk of stroke. The patient's score is based upon: CHF History: 0 HTN History: 1 Diabetes History: 1 Stroke History: 0 Vascular Disease History: 1 Age Score: 2 Gender Score: 0           Physical Exam:    Today's Vitals   10/25/24 0807  BP:  130/80  Pulse: 68  Temp: 98 F (36.7 C)  TempSrc: Oral  SpO2: 94%  Weight: 110.2 kg  Height: 6' (1.829 m)  PainSc: 0-No pain   Body mass index is 32.96 kg/m.   GEN: Well nourished, well developed in no acute distress NECK: No JVD CARDIAC: Normal rate, regular rhythm RESPIRATORY:  Clear to auscultation without rales, wheezing or rhonchi  ABDOMEN: Soft, non-distended EXTREMITIES:  No edema; No deformity    ASSESSMENT AND PLAN:     #Persistent atrial fibrillation: Associated with heart failure.  For this reason, we have prioritize a rhythm control strategy.  His EF is reduced and he has presence of scar on a stress test, so his antiarrhythmic options are amiodarone  or Tikosyn.  He reports significant improvement in symptoms with reduction of AF burden on amiodarone . #Atrial flutter:  #High risk medication  use: Amiodarone . LFTs normal 04/2024. TSH normal 10/2023. -Discussed treatment options today for AF including antiarrhythmic drug therapy - continuing amiodarone  or changing to Tikosyn - and ablation. Discussed risks, recovery and likelihood of success with each treatment strategy. Risk, benefits, and alternatives to EP study and ablation for afib were discussed. These risks include but are not limited to stroke, bleeding, vascular damage, tamponade, perforation, damage to the esophagus, lungs, phrenic nerve and other structures, pulmonary vein stenosis, worsening renal function, coronary vasospasm and death.  Discussed potential need for repeat ablation procedures and antiarrhythmic drugs after an initial ablation. The patient understands these risk and wishes to proceed with ablation today.  -Continue amiodarone  200 mg once daily as bridge to ablation.    #Hypercoagulable state due to AF/AFL: -Continue Eliquis  5 mg twice daily.  No bleeding issues.   -#HFmrEF: Well compensated on exam today. Continue GDMT regimen of losartan , metoprolol  and follow-up with Dr. Santo.   Follow  up with Dr. Kennyth 3 months after ablation.    Signed,     "

## 2024-10-25 NOTE — Transfer of Care (Signed)
 Immediate Anesthesia Transfer of Care Note  Patient: Brent Taylor  Procedure(s) Performed: ATRIAL FIBRILLATION ABLATION A-FLUTTER ABLATION  Patient Location: PACU and Cath Lab  Anesthesia Type:General  Level of Consciousness: awake, alert , oriented, and patient cooperative  Airway & Oxygen  Therapy: Patient Spontanous Breathing and Patient connected to face mask oxygen   Post-op Assessment: Report given to RN and Post -op Vital signs reviewed and stable  Post vital signs: Reviewed and stable  Last Vitals:  Vitals Value Taken Time  BP 141/91 10/25/24 12:16  Temp    Pulse 67 10/25/24 12:20  Resp 18 10/25/24 12:20  SpO2 94 % 10/25/24 12:20  Vitals shown include unfiled device data.  Last Pain:  Vitals:   10/25/24 0807  TempSrc: Oral  PainSc: 0-No pain         Complications: There were no known notable events for this encounter.

## 2024-10-25 NOTE — Progress Notes (Signed)
 Patient and wife was given discharge instrucitons. Both verbalized understanding.

## 2024-10-25 NOTE — Anesthesia Procedure Notes (Signed)
 Procedure Name: Intubation Date/Time: 10/25/2024 9:54 AM  Performed by: Genny Gun, CRNAPre-anesthesia Checklist: Patient identified, Emergency Drugs available, Suction available, Patient being monitored and Timeout performed Patient Re-evaluated:Patient Re-evaluated prior to induction Oxygen  Delivery Method: Circle system utilized Preoxygenation: Pre-oxygenation with 100% oxygen  Induction Type: IV induction Ventilation: Mask ventilation without difficulty and Oral airway inserted - appropriate to patient size Laryngoscope Size: Mac and 4 Grade View: Grade I Tube type: Oral Tube size: 7.5 mm Number of attempts: 1 Placement Confirmation: ETT inserted through vocal cords under direct vision, positive ETCO2 and breath sounds checked- equal and bilateral Secured at: 22 cm Tube secured with: Tape Dental Injury: Teeth and Oropharynx as per pre-operative assessment

## 2024-10-25 NOTE — Progress Notes (Signed)
 Pt received discharge instructions. Iv removed, no complications. Bilat fem sites are clean dry intact, sites are soft, no signs of bleeding. Pt escorted out via wheelchair to wife's vehicle.

## 2024-10-25 NOTE — Anesthesia Preprocedure Evaluation (Signed)
 "                                  Anesthesia Evaluation  Patient identified by MRN, date of birth, ID band Patient awake    Reviewed: Allergy & Precautions, H&P , NPO status , Patient's Chart, lab work & pertinent test results  History of Anesthesia Complications Negative for: history of anesthetic complications  Airway Mallampati: II  TM Distance: >3 FB Neck ROM: Full    Dental no notable dental hx. (+) Teeth Intact   Pulmonary sleep apnea , former smoker   Pulmonary exam normal breath sounds clear to auscultation       Cardiovascular Exercise Tolerance: Good hypertension, Pt. on medications and Pt. on home beta blockers + CAD and +CHF  + dysrhythmias Atrial Fibrillation  Rhythm:Regular Rate:Normal   1. Left ventricular ejection fraction, by estimation, is 50 to 55%. The  left ventricle has low normal function. The left ventricle has no regional  wall motion abnormalities. There is mild concentric left ventricular  hypertrophy. Left ventricular  diastolic parameters are indeterminate.   2. Right ventricular systolic function is normal. The right ventricular  size is normal. There is normal pulmonary artery systolic pressure.   3. Left atrial size was severely dilated.   4. Right atrial size was severely dilated.   5. The mitral valve is normal in structure. Mild mitral valve  regurgitation. No evidence of mitral stenosis.   6. The aortic valve is tricuspid. Aortic valve regurgitation is not  visualized. No aortic stenosis is present.   7. Aortic dilatation noted. There is moderate dilatation of the ascending  aorta, measuring 46 mm.   8. The inferior vena cava is normal in size with greater than 50%  respiratory variability, suggesting right atrial pressure of 3 mmHg.      Neuro/Psych  PSYCHIATRIC DISORDERS Anxiety Depression    negative neurological ROS     GI/Hepatic Neg liver ROS,GERD  Medicated,,  Endo/Other  diabetes    Renal/GU negative Renal  ROS  negative genitourinary   Musculoskeletal  (+) Arthritis , Osteoarthritis,    Abdominal   Peds  Hematology  (+) Blood dyscrasia, anemia   Anesthesia Other Findings Past Medical History: 10/24: Anemia     Comment:  low hemaglobin No date: Anxiety 2015: Arthritis No date: Atrial fibrillation (HCC) 2000: Cancer (HCC)     Comment:  hx - melanoma 2001: CHF (congestive heart failure) (HCC)     Comment:  Not sure No date: Chicken pox No date: Colon polyps 1995: Coronary artery disease No date: Depression     Comment:  No need for therapy.  Improved No date: Diabetes (HCC) No date: Diverticula, colon No date: Duodenal nodule No date: Dyspnea     Comment:  with exertion 1980: GERD (gastroesophageal reflux disease) 2019: Heart murmur     Comment:  Melanoma removed, no reacurrance No date: Hemorrhoids ?: Hyperlipidemia Since teen years: Hypertension     Comment:  Hereditaryh 01/23: Neuromuscular disorder (HCC)     Comment:  neuopathy No date: Sepsis (HCC) No date: Sepsis (HCC) 2000: Skin cancer 2020: Sleep apnea     Comment:  Use CPAP No date: Tinnitus No date: UTI (lower urinary tract infection)   Reproductive/Obstetrics negative OB ROS  Anesthesia Physical Anesthesia Plan  ASA: 3  Anesthesia Plan: General   Post-op Pain Management: Minimal or no pain anticipated   Induction: Intravenous  PONV Risk Score and Plan: 2 and Treatment may vary due to age or medical condition, Dexamethasone  and Ondansetron   Airway Management Planned: Oral ETT  Additional Equipment: None  Intra-op Plan:   Post-operative Plan: Extubation in OR  Informed Consent: I have reviewed the patients History and Physical, chart, labs and discussed the procedure including the risks, benefits and alternatives for the proposed anesthesia with the patient or authorized representative who has indicated his/her understanding and  acceptance.     Dental advisory given  Plan Discussed with: CRNA  Anesthesia Plan Comments: (Discussed risks of anesthesia with patient, including PONV, sore throat, lip/dental/eye damage. Rare risks discussed as well, such as cardiorespiratory and neurological sequelae, and allergic reactions. Discussed the role of CRNA in patient's perioperative care. Patient understands.)        Anesthesia Quick Evaluation  "

## 2024-10-25 NOTE — Progress Notes (Signed)
  Pt arrived from EP via Bed to HA 20. Report received from RN and CRNA. Pt vitals are stable, performed q49min see vitals flowsheet. Anesthesia recovery has been uneventful. Pt to transition to short stay for remainder of recovery. Will continue to monitor patient while under holding area care.

## 2024-10-25 NOTE — Discharge Instructions (Signed)

## 2024-10-26 ENCOUNTER — Telehealth (HOSPITAL_COMMUNITY): Payer: Self-pay

## 2024-10-26 ENCOUNTER — Encounter (HOSPITAL_COMMUNITY): Payer: Self-pay | Admitting: Cardiology

## 2024-10-26 MED FILL — Cefazolin Sodium-Dextrose IV Solution 2 GM/100ML-4%: INTRAVENOUS | Qty: 100 | Status: AC

## 2024-10-26 MED FILL — Atropine Sulfate Soln Prefill Syr 1 MG/10ML (0.1 MG/ML): INTRAMUSCULAR | Qty: 10 | Status: AC

## 2024-10-26 NOTE — Telephone Encounter (Signed)
 Attempted to reach patient to follow up with procedure completed on 10/25/24, no answer. Left VM for patient to return call.

## 2024-11-01 ENCOUNTER — Ambulatory Visit: Payer: PPO | Admitting: Family Medicine

## 2024-11-01 ENCOUNTER — Encounter: Payer: Self-pay | Admitting: Family Medicine

## 2024-11-01 VITALS — BP 120/80 | HR 72 | Temp 97.2°F | Ht 72.0 in | Wt 238.6 lb

## 2024-11-01 DIAGNOSIS — E559 Vitamin D deficiency, unspecified: Secondary | ICD-10-CM

## 2024-11-01 DIAGNOSIS — F339 Major depressive disorder, recurrent, unspecified: Secondary | ICD-10-CM

## 2024-11-01 DIAGNOSIS — I152 Hypertension secondary to endocrine disorders: Secondary | ICD-10-CM

## 2024-11-01 DIAGNOSIS — E1159 Type 2 diabetes mellitus with other circulatory complications: Secondary | ICD-10-CM

## 2024-11-01 DIAGNOSIS — D509 Iron deficiency anemia, unspecified: Secondary | ICD-10-CM

## 2024-11-01 DIAGNOSIS — M25551 Pain in right hip: Secondary | ICD-10-CM

## 2024-11-01 DIAGNOSIS — E1169 Type 2 diabetes mellitus with other specified complication: Secondary | ICD-10-CM

## 2024-11-01 DIAGNOSIS — N390 Urinary tract infection, site not specified: Secondary | ICD-10-CM

## 2024-11-01 DIAGNOSIS — N529 Male erectile dysfunction, unspecified: Secondary | ICD-10-CM

## 2024-11-01 DIAGNOSIS — E538 Deficiency of other specified B group vitamins: Secondary | ICD-10-CM

## 2024-11-01 DIAGNOSIS — I480 Paroxysmal atrial fibrillation: Secondary | ICD-10-CM

## 2024-11-01 DIAGNOSIS — Z0001 Encounter for general adult medical examination with abnormal findings: Secondary | ICD-10-CM

## 2024-11-01 LAB — IBC + FERRITIN
Ferritin: 114.7 ng/mL (ref 22.0–322.0)
Iron: 77 ug/dL (ref 42–165)
Saturation Ratios: 23.8 % (ref 20.0–50.0)
TIBC: 323.4 ug/dL (ref 250.0–450.0)
Transferrin: 231 mg/dL (ref 212.0–360.0)

## 2024-11-01 LAB — COMPREHENSIVE METABOLIC PANEL WITH GFR
ALT: 18 U/L (ref 3–53)
AST: 23 U/L (ref 5–37)
Albumin: 4.2 g/dL (ref 3.5–5.2)
Alkaline Phosphatase: 63 U/L (ref 39–117)
BUN: 19 mg/dL (ref 6–23)
CO2: 24 meq/L (ref 19–32)
Calcium: 9.2 mg/dL (ref 8.4–10.5)
Chloride: 104 meq/L (ref 96–112)
Creatinine, Ser: 1.21 mg/dL (ref 0.40–1.50)
GFR: 54.42 mL/min — ABNORMAL LOW
Glucose, Bld: 141 mg/dL — ABNORMAL HIGH (ref 70–99)
Potassium: 4.1 meq/L (ref 3.5–5.1)
Sodium: 139 meq/L (ref 135–145)
Total Bilirubin: 0.7 mg/dL (ref 0.2–1.2)
Total Protein: 6.9 g/dL (ref 6.0–8.3)

## 2024-11-01 LAB — VITAMIN B12: Vitamin B-12: 465 pg/mL (ref 211–911)

## 2024-11-01 LAB — URINALYSIS, ROUTINE W REFLEX MICROSCOPIC
Bilirubin Urine: NEGATIVE
Hgb urine dipstick: NEGATIVE
Ketones, ur: NEGATIVE
Nitrite: NEGATIVE
RBC / HPF: NONE SEEN
Specific Gravity, Urine: 1.015 (ref 1.000–1.030)
Total Protein, Urine: NEGATIVE
Urine Glucose: NEGATIVE
Urobilinogen, UA: 0.2 (ref 0.0–1.0)
pH: 6.5 (ref 5.0–8.0)

## 2024-11-01 LAB — CBC
HCT: 49.2 % (ref 39.0–52.0)
Hemoglobin: 16.1 g/dL (ref 13.0–17.0)
MCHC: 32.7 g/dL (ref 30.0–36.0)
MCV: 91.9 fl (ref 78.0–100.0)
Platelets: 235 K/uL (ref 150.0–400.0)
RBC: 5.36 Mil/uL (ref 4.22–5.81)
RDW: 14.4 % (ref 11.5–15.5)
WBC: 6.4 K/uL (ref 4.0–10.5)

## 2024-11-01 LAB — LIPID PANEL
Cholesterol: 136 mg/dL (ref 28–200)
HDL: 41 mg/dL
LDL Cholesterol: 73 mg/dL (ref 10–99)
NonHDL: 94.67
Total CHOL/HDL Ratio: 3
Triglycerides: 110 mg/dL (ref 10.0–149.0)
VLDL: 22 mg/dL (ref 0.0–40.0)

## 2024-11-01 LAB — MICROALBUMIN / CREATININE URINE RATIO
Creatinine,U: 67.5 mg/dL
Microalb Creat Ratio: UNDETERMINED mg/g (ref 0.0–30.0)
Microalb, Ur: <0.7 mg/dL

## 2024-11-01 LAB — HEMOGLOBIN A1C: Hgb A1c MFr Bld: 7 % — ABNORMAL HIGH (ref 4.6–6.5)

## 2024-11-01 LAB — TESTOSTERONE: Testosterone: 433.16 ng/dL (ref 300.00–890.00)

## 2024-11-01 LAB — TSH: TSH: 2.93 u[IU]/mL (ref 0.35–5.50)

## 2024-11-01 LAB — PSA: PSA: 1.78 ng/mL (ref 0.10–4.00)

## 2024-11-01 LAB — VITAMIN D 25 HYDROXY (VIT D DEFICIENCY, FRACTURES): VITD: 16.98 ng/mL — ABNORMAL LOW (ref 30.00–100.00)

## 2024-11-01 MED ORDER — TRAMADOL HCL 50 MG PO TABS: 50.0000 mg | ORAL_TABLET | Freq: Every day | ORAL | 5 refills | Status: AC | PRN

## 2024-11-01 MED ORDER — TADALAFIL 20 MG PO TABS: 10.0000 mg | ORAL_TABLET | ORAL | 11 refills | Status: AC | PRN

## 2024-11-01 NOTE — Assessment & Plan Note (Signed)
 Check B12

## 2024-11-01 NOTE — Assessment & Plan Note (Signed)
 Check lipids with labs.  On pravastatin  80 mg daily.  Tolerating well.

## 2024-11-01 NOTE — Assessment & Plan Note (Signed)
 Stable on Prozac  40 mg daily.  Tolerating well without significant side effects.

## 2024-11-01 NOTE — Progress Notes (Signed)
 "  Chief Complaint:Brent  Ordell Taylor is a 84 y.o. male who presents today for his annual comprehensive physical exam.    Assessment/Plan:  New/Acute Problems: Right Hip Pain This has been a persistent issue for several months though does seem to be worsening recently.  X-rays a couple of months ago showed degenerative changes.  He be having some impingement symptoms as well.  Will place referral to sports medicine for further evaluation and management.  Chronic Problems Addressed Today: Dyslipidemia associated with type 2 diabetes mellitus (HCC) Check lipids with labs.  On pravastatin  80 mg daily.  Tolerating well.  Type 2 diabetes mellitus with vascular disease (HCC) A1c typically well-controlled without meds.  Recheck today.  Discussed lifestyle modifications.  Erectile dysfunction This is still an ongoing issue.  Did not have much excess with Cialis  though would like to try again.  Will send in 20 mg daily.  He tolerated well.  Will also check testosterone  and other labs per patient request.  May consider referral to urology depending on results of his testosterone  levels.  B12 deficiency Check B12.  Iron deficiency anemia Check labs including iron panel.  Depression, recurrent Stable on Prozac  40 mg daily.  Tolerating well without significant side effects.  Hypertension associated with diabetes (HCC) Blood pressure at goal today on current regimen per cardiology with losartan  25 mg daily and metoprolol  50 mg twice daily.  Atrial fibrillation [facility]) Doing well status post ablation.  Regular rate and rhythm on exam today.  Preventative Healthcare: Check labs.  Up-to-date on vaccines.  No longer needs colon cancer screening due to age.  Patient Counseling(The following topics were reviewed and/or handout was given):  -Nutrition: Stressed importance of moderation in sodium/caffeine  intake, saturated fat and cholesterol, caloric balance, sufficient intake of fresh fruits,  vegetables, and fiber.  -Stressed the importance of regular exercise.   -Substance Abuse: Discussed cessation/primary prevention of tobacco, alcohol, or other drug use; driving or other dangerous activities under the influence; availability of treatment for abuse.   -Injury prevention: Discussed safety belts, safety helmets, smoke detector, smoking near bedding or upholstery.   -Sexuality: Discussed sexually transmitted diseases, partner selection, use of condoms, avoidance of unintended pregnancy and contraceptive alternatives.   -Dental health: Discussed importance of regular tooth brushing, flossing, and dental visits.  -Health maintenance and immunizations reviewed. Please refer to Health maintenance section.  Return to care in 1 year for next preventative visit.     Subjective:  HPI:  He has no acute complaints today. Patient is here today for his annual physical.  See assessment / plan for status of chronic conditions.  Discussed the use of AI scribe software for clinical note transcription with the patient, who gave verbal consent to proceed.  History of Present Illness Brent Taylor is an 84 year old male who presents with hip and groin pain.  He experiences pain originating in the groin and radiating to the knee, which worsens with leg lifting, particularly when crossing his leg or putting on socks and shoes. The pain is described as severe during certain hip movements. Previous imaging, including an x-ray and CT scan, were performed; the patient recalls being told there was a little arthritis and possibly a muscle strain.  He has a history of urinary tract infections and is currently awaiting a urine culture to check for any ongoing infection.  He experiences erectile dysfunction and has previously tried Cialis  without significant improvement. He is interested in evaluating his testosterone  levels as part  of the assessment for ED.  He is concerned about his blood sugar  levels, questioning whether he is prediabetic or diabetic. He monitors his blood sugar at home, noting levels around 153 mg/dL, and has a history of an A1c in the diabetic range. He does not take any medications for diabetes.  He reports neuropathy in his feet and legs, which has improved with treatment at a specialized clinic. He continues to perform exercises as recommended by the clinic.        11/01/2024    8:45 AM  Depression screen PHQ 2/9  Decreased Interest 0  Down, Depressed, Hopeless 0  PHQ - 2 Score 0    Health Maintenance Due  Topic Date Due   FOOT EXAM  10/31/2019   OPHTHALMOLOGY EXAM  05/21/2022   Diabetic kidney evaluation - Urine ACR  10/31/2024   HEMOGLOBIN A1C  10/31/2024   Medicare Annual Wellness (AWV)  12/26/2024     ROS: Per HPI, otherwise a complete review of systems was negative.   PMH:  The following were reviewed and entered/updated in epic: Past Medical History:  Diagnosis Date   Anemia 10/24   low hemaglobin   Anxiety    Arthritis 2015   Atrial fibrillation (HCC)    Cancer (HCC) 2000   hx - melanoma   CHF (congestive heart failure) (HCC) 2001   Not sure   Chicken pox    Colon polyps    Coronary artery disease 1995   Depression    No need for therapy.  Improved   Diabetes (HCC)    Diverticula, colon    Duodenal nodule    Dyspnea    with exertion   GERD (gastroesophageal reflux disease) 1980   Heart murmur 2019   Melanoma removed, no reacurrance   Hemorrhoids    Hyperlipidemia ?   Hypertension Since teen years   Hereditaryh   Neuromuscular disorder (HCC) 01/23   neuopathy   Sepsis (HCC)    Sepsis (HCC)    Skin cancer 2000   Sleep apnea 2020   Use CPAP   Tinnitus    UTI (lower urinary tract infection)    Patient Active Problem List   Diagnosis Date Noted   Avitaminosis D 04/30/2024   DOE (dyspnea on exertion) 04/25/2024   Atypical atrial flutter (HCC) 11/25/2023   Iron deficiency anemia 08/01/2023   Esophageal  obstruction due to food impaction 02/05/2023   Peripheral neuropathy 12/22/2022   Spinal stenosis 11/08/2022   Gait abnormality 09/16/2022   Degenerative disc disease, cervical 09/16/2022   B12 deficiency 04/22/2022   Erectile dysfunction 10/21/2021   Thoracic Aortic dilatation (HCC) 02/13/2021   OSA (obstructive sleep apnea) 12/09/2020   Sensorineural hearing loss (SNHL) of left ear with restricted hearing of right ear 08/19/2020   Subjective tinnitus, bilateral 08/19/2020   Atrial fibrillation (HCC) 06/10/2020   Hypercoagulable state due to persistent atrial fibrillation (HCC) 03/31/2020   Spinal stenosis of lumbar region 08/29/2019   Type 2 diabetes mellitus with vascular disease (HCC) 10/20/2016   Hypertension associated with diabetes (HCC) 11/20/2015   Coronary artery disease 12/10/2011   Dyslipidemia associated with type 2 diabetes mellitus (HCC) 12/10/2011   Depression, recurrent 12/10/2011   GERD (gastroesophageal reflux disease) 12/10/2011   Hx of colonic polyps 12/10/2011   History of melanoma 12/10/2011   Past Surgical History:  Procedure Laterality Date   A-FLUTTER ABLATION N/A 10/25/2024   Procedure: A-FLUTTER ABLATION;  Surgeon: Kennyth Chew, MD;  Location: [facility] INVASIVE CV LAB;  Service: Cardiovascular;  Laterality: N/A;   ATRIAL FIBRILLATION ABLATION N/A 10/25/2024   Procedure: ATRIAL FIBRILLATION ABLATION;  Surgeon: Kennyth Chew, MD;  Location: [facility] INVASIVE CV LAB;  Service: Cardiovascular;  Laterality: N/A;   CARDIAC CATHETERIZATION  ?   CARDIOVERSION N/A 11/11/2023   Procedure: CARDIOVERSION;  Surgeon: Shlomo Wilbert SAUNDERS, MD;  Location: [facility] INVASIVE CV LAB;  Service: Cardiovascular;  Laterality: N/A;   ESOPHAGOGASTRODUODENOSCOPY N/A 02/05/2023   Procedure: ESOPHAGOGASTRODUODENOSCOPY (EGD);  Surgeon: Eda Iha, MD;  Location: [facility] ENDOSCOPY;  Service: Gastroenterology;  Laterality: N/A;   EYE SURGERY  2005   FOREIGN BODY REMOVAL  02/05/2023   Procedure: FOREIGN  BODY REMOVAL;  Surgeon: Eda Iha, MD;  Location: [facility] ENDOSCOPY;  Service: Gastroenterology;;   LUMBAR LAMINECTOMY/DECOMPRESSION MICRODISCECTOMY N/A 11/08/2022   Procedure: LUMBAR FOUR FIVE DECOMPRESSION AND IN SITU FUSION;  Surgeon: Burnetta Aures, MD;  Location: [facility] OR;  Service: Orthopedics;  Laterality: N/A;    MELANOMA EXCISION     LEFT CHEST   MELANOMA EXCISION     SPINE SURGERY  10/20/2022   VASECTOMY  1980    Family History  Problem Relation Age of Onset   Heart Problems Mother        Skipping   Heart attack Mother    Hypertension Mother    Heart attack Father    Heart disease Father    Heart attack Sister    Colon cancer Neg Hx    Esophageal cancer Neg Hx    Rectal cancer Neg Hx    Prostate cancer Neg Hx     Medications- reviewed and updated Current Outpatient Medications  Medication Sig Dispense Refill   acetaminophen  (TYLENOL ) 650 MG CR tablet Take 650-1,300 mg by mouth daily as needed for pain.     albuterol  (VENTOLIN  HFA) 108 (90 Base) MCG/ACT inhaler Inhale 2 puffs into the lungs every 6 (six) hours as needed for wheezing or shortness of breath. 8 g 0   amiodarone  (PACERONE ) 200 MG tablet Take 2 tablets (400 mg total) by mouth 2 (two) times daily for 7 days, THEN 1 tablet (200 mg total) 2 (two) times daily for 7 days, THEN 1 tablet (200 mg total) daily. (Patient taking differently: 1 tablet (200 mg total) daily.) 110 tablet 3   Continuous Glucose Sensor (FREESTYLE LIBRE 3 SENSOR) MISC Place 1 sensor on the skin Q14 days. Use to check glucose continuously Code E11.6 2 each 5   cyanocobalamin  (VITAMIN B12) 1000 MCG tablet Take 1,000 mcg by mouth once a week.     ELIQUIS  5 MG TABS tablet TAKE 1 TABLET BY MOUTH 2 TIMES A DAY 60 tablet 5   fluticasone (FLONASE) 50 MCG/ACT nasal spray Place 1 spray into both nostrils daily as needed for allergies or rhinitis.     furosemide  (LASIX ) 20 MG tablet Take 1 tablet (20 mg total) by mouth daily. 30 tablet 3   losartan   (COZAAR ) 25 MG tablet Take 1 tablet (25 mg total) by mouth daily. 90 tablet 3   pantoprazole  (PROTONIX ) 40 MG tablet Take 1 tablet (40 mg total) by mouth daily. Office visit for further refills 90 tablet 0   pravastatin  (PRAVACHOL ) 80 MG tablet TAKE ONE TABLET BY MOUTH EVERY EVENING 90 tablet 3   tadalafil  (CIALIS ) 20 MG tablet Take 0.5-1 tablets (10-20 mg total) by mouth every other day as needed for erectile dysfunction. 10 tablet 11   traMADol  (ULTRAM ) 50 MG tablet Take 1 tablet (50 mg total) by mouth daily as needed  for moderate pain (pain score 4-6) or severe pain (pain score 7-10). 30 tablet 5   No current facility-administered medications for this visit.    Allergies-reviewed and updated Allergies[1]  Social History   Socioeconomic History   Marital status: Married    Spouse name: Not on file   Number of children: 2   Years of education: Not on file   Highest education level: Master's degree (e.g., MA, MS, MEng, MEd, MSW, MBA)  Occupational History   Occupation: Optician, Dis[employer]   Occupation: retired  Tobacco Use   Smoking status: Former    Types: Cigarettes   Smokeless tobacco: Never   Tobacco comments:    Former smoker 06/17/23  Vaping Use   Vaping status: Never Used  Substance and Sexual Activity   Alcohol use: No   Drug use: No   Sexual activity: Yes  Other Topics Concern   Not on file  Social History Narrative   Lives with wife in [place].   Retired [employer]   Attends [employer]     Social Drivers of Health   Tobacco Use: Medium Risk (11/01/2024)   Patient History    Smoking Tobacco Use: Former    Smokeless Tobacco Use: Never    Passive Exposure: Not on file  Financial Resource Strain: Low Risk (08/17/2024)   Overall Financial Resource Strain (CARDIA)    Difficulty of Paying Living Expenses: Not hard at all  Food Insecurity: No Food Insecurity (08/17/2024)   Epic    Worried About Programme Researcher, Broadcasting/film/video in the Last Year: Never true    Ran  Out of Food in the Last Year: Never true  Transportation Needs: No Transportation Needs (08/17/2024)   Epic    Lack of Transportation (Medical): No    Lack of Transportation (Non-Medical): No  Physical Activity: Insufficiently Active (08/17/2024)   Exercise Vital Sign    Days of Exercise per Week: 2 days    Minutes of Exercise per Session: 20 min  Stress: No Stress Concern Present (08/17/2024)   [employer] of Occupational Health - Occupational Stress Questionnaire    Feeling of Stress: Not at all  Social Connections: Socially Integrated (08/17/2024)   Social Connection and Isolation Panel    Frequency of Communication with Friends and Family: More than three times a week    Frequency of Social Gatherings with Friends and Family: Three times a week    Attends Religious Services: More than 4 times per year    Active Member of Clubs or Organizations: Yes    Attends Banker Meetings: More than 4 times per year    Marital Status: Married  Depression (PHQ2-9): Low Risk (11/01/2024)   Depression (PHQ2-9)    PHQ-2 Score: 0  Alcohol Screen: Low Risk (12/23/2023)   Alcohol Screen    Last Alcohol Screening Score (AUDIT): 0  Housing: Low Risk (08/17/2024)   Epic    Unable to Pay for Housing in the Last Year: No    Number of Times Moved in the Last Year: 0    Homeless in the Last Year: No  Utilities: Not At Risk (05/09/2024)   Epic    Threatened with loss of utilities: No  Health Literacy: Adequate Health Literacy (12/27/2023)   B1300 Health Literacy    Frequency of need for help with medical instructions: Never        Objective:  Physical Exam: BP 120/80   Pulse 72   Temp (!) 97.2 F (36.2 C) (Temporal)  Ht 6' (1.829 m)   Wt 238 lb 9.6 oz (108.2 kg)   SpO2 94%   BMI 32.36 kg/m   Body mass index is 32.36 kg/m. Wt Readings from Last 3 Encounters:  11/01/24 238 lb 9.6 oz (108.2 kg)  10/25/24 243 lb (110.2 kg)  10/16/24 243 lb (110.2 kg)   Gen: NAD, resting  comfortably HEENT: TMs normal bilaterally. OP clear. No thyromegaly noted.  CV: RRR with no murmurs appreciated Pulm: NWOB, CTAB with no crackles, wheezes, or rhonchi GI: Normal bowel sounds present. Soft, Nontender, Nondistended. MSK: no edema, cyanosis, or clubbing noted - Right Leg: No deformities.  Pain elicited with external rotation and flexion. Skin: warm, dry Neuro: CN2-12 grossly intact. Strength 5/5 in upper and lower extremities. Reflexes symmetric and intact bilaterally.  Psych: Normal affect and thought content     Shenika Quint M. Kennyth, MD 11/01/2024 9:33 AM     [1]  Allergies Allergen Reactions   Gabapentin  Other (See Comments)    Lethargic and depressed, aching   Lipitor [Atorvastatin Calcium ]     Muscle ache   "

## 2024-11-01 NOTE — Assessment & Plan Note (Signed)
 A1c typically well-controlled without meds.  Recheck today.  Discussed lifestyle modifications.

## 2024-11-01 NOTE — Patient Instructions (Signed)
 It was very nice to see you today!  VISIT SUMMARY: During your visit, we discussed your hip and groin pain, blood sugar levels, and erectile dysfunction. We reviewed your imaging results and discussed your diabetes management and neuropathy improvement.  YOUR PLAN: RIGHT HIP OSTEOARTHRITIS WITH IMPINGEMENT: You have chronic right hip pain that radiates to your knee, which is consistent with hip impingement. Your x-ray shows subtle arthritis and bone spurs. -You are referred to sports medicine for evaluation and possible cortisone injection. -Your Tramadol  prescription has been refilled for pain management.  TYPE 2 DIABETES MELLITUS WITH CIRCULATORY COMPLICATIONS: Your A1c remains in the diabetic range, but your blood glucose is well-controlled without medication. Your neuropathy has improved with Align therapy. -We have ordered blood work including A1c, blood counts, PSA, and testosterone  levels. -We checked your urine culture to rule out a urinary tract infection. -Continue your current management without medication unless your A1c increases significantly.  ERECTILE DYSFUNCTION: You have experienced erectile dysfunction and a previous trial of Cialis  was ineffective. -We will check your testosterone  levels for any deficiency. -We have prescribed Cialis  20 mg tablet for you to try.  Return in about 6 months (around 05/01/2025) for Follow Up.   Take care, Dr Kennyth  PLEASE NOTE:  If you had any lab tests, please let us  know if you have not heard back within a few days. You may see your results on mychart before we have a chance to review them but we will give you a call once they are reviewed by us .   If we ordered any referrals today, please let us  know if you have not heard from their office within the next week.   If you had any urgent prescriptions sent in today, please check with the pharmacy within an hour of our visit to make sure the prescription was transmitted appropriately.    Please try these tips to maintain a healthy lifestyle:  Eat at least 3 REAL meals and 1-2 snacks per day.  Aim for no more than 5 hours between eating.  If you eat breakfast, please do so within one hour of getting up.   Each meal should contain half fruits/vegetables, one quarter protein, and one quarter carbs (no bigger than a computer mouse)  Cut down on sweet beverages. This includes juice, soda, and sweet tea.   Drink at least 1 glass of water with each meal and aim for at least 8 glasses per day  Exercise at least 150 minutes every week.    Preventive Care 46 Years and Older, Male Preventive care refers to lifestyle choices and visits with your health care provider that can promote health and wellness. Preventive care visits are also called wellness exams. What can I expect for my preventive care visit? Counseling During your preventive care visit, your health care provider may ask about your: Medical history, including: Past medical problems. Family medical history. History of falls. Current health, including: Emotional well-being. Home life and relationship well-being. Sexual activity. Memory and ability to understand (cognition). Lifestyle, including: Alcohol, nicotine or tobacco, and drug use. Access to firearms. Diet, exercise, and sleep habits. Work and work astronomer. Sunscreen use. Safety issues such as seatbelt and bike helmet use. Physical exam Your health care provider will check your: Height and weight. These may be used to calculate your BMI (body mass index). BMI is a measurement that tells if you are at a healthy weight. Waist circumference. This measures the distance around your waistline. This measurement also  tells if you are at a healthy weight and may help predict your risk of certain diseases, such as type 2 diabetes and high blood pressure. Heart rate and blood pressure. Body temperature. Skin for abnormal spots. What immunizations do I  need?  Vaccines are usually given at various ages, according to a schedule. Your health care provider will recommend vaccines for you based on your age, medical history, and lifestyle or other factors, such as travel or where you work. What tests do I need? Screening Your health care provider may recommend screening tests for certain conditions. This may include: Lipid and cholesterol levels. Diabetes screening. This is done by checking your blood sugar (glucose) after you have not eaten for a while (fasting). Hepatitis C test. Hepatitis B test. HIV (human immunodeficiency virus) test. STI (sexually transmitted infection) testing, if you are at risk. Lung cancer screening. Colorectal cancer screening. Prostate cancer screening. Abdominal aortic aneurysm (AAA) screening. You may need this if you are a current or former smoker. Talk with your health care provider about your test results, treatment options, and if necessary, the need for more tests. Follow these instructions at home: Eating and drinking  Eat a diet that includes fresh fruits and vegetables, whole grains, lean protein, and low-fat dairy products. Limit your intake of foods with high amounts of sugar, saturated fats, and salt. Take vitamin and mineral supplements as recommended by your health care provider. Do not drink alcohol if your health care provider tells you not to drink. If you drink alcohol: Limit how much you have to 0-2 drinks a day. Know how much alcohol is in your drink. In the [place] one drink equals one 12 oz bottle of beer (355 mL), one 5 oz glass of wine (148 mL), or one 1 oz glass of hard liquor (44 mL). Lifestyle Brush your teeth every morning and night with fluoride toothpaste. Floss one time each day. Exercise for at least 30 minutes 5 or more days each week. Do not use any products that contain nicotine or tobacco. These products include cigarettes, chewing tobacco, and vaping devices, such as  e-cigarettes. If you need help quitting, ask your health care provider. Do not use drugs. If you are sexually active, practice safe sex. Use a condom or other form of protection to prevent STIs. Take aspirin only as told by your health care provider. Make sure that you understand how much to take and what form to take. Work with your health care provider to find out whether it is safe and beneficial for you to take aspirin daily. Ask your health care provider if you need to take a cholesterol-lowering medicine (statin). Find healthy ways to manage stress, such as: Meditation, yoga, or listening to music. Journaling. Talking to a trusted person. Spending time with friends and family. Safety Always wear your seat belt while driving or riding in a vehicle. Do not drive: If you have been drinking alcohol. Do not ride with someone who has been drinking. When you are tired or distracted. While texting. If you have been using any mind-altering substances or drugs. Wear a helmet and other protective equipment during sports activities. If you have firearms in your house, make sure you follow all gun safety procedures. Minimize exposure to UV radiation to reduce your risk of skin cancer. What's next? Visit your health care provider once a year for an annual wellness visit. Ask your health care provider how often you should have your eyes and teeth checked. Stay up  to date on all vaccines. This information is not intended to replace advice given to you by your health care provider. Make sure you discuss any questions you have with your health care provider. Document Revised: 03/25/2021 Document Reviewed: 03/25/2021 Elsevier Patient Education  2024 [employer].

## 2024-11-01 NOTE — Assessment & Plan Note (Signed)
 Doing well status post ablation.  Regular rate and rhythm on exam today.

## 2024-11-01 NOTE — Assessment & Plan Note (Signed)
 Blood pressure at goal today on current regimen per cardiology with losartan  25 mg daily and metoprolol  50 mg twice daily.

## 2024-11-01 NOTE — Assessment & Plan Note (Signed)
 Check labs including iron panel.

## 2024-11-01 NOTE — Assessment & Plan Note (Signed)
 This is still an ongoing issue.  Did not have much excess with Cialis  though would like to try again.  Will send in 20 mg daily.  He tolerated well.  Will also check testosterone  and other labs per patient request.  May consider referral to urology depending on results of his testosterone  levels.

## 2024-11-02 ENCOUNTER — Ambulatory Visit: Payer: Self-pay | Admitting: Family Medicine

## 2024-11-02 DIAGNOSIS — N390 Urinary tract infection, site not specified: Secondary | ICD-10-CM

## 2024-11-02 NOTE — Progress Notes (Signed)
 We are still waiting on his urine culture to come back though does look like he has some inflammation that may be leftover from his previous urinary tract infection.  We will contact him once we have results back in his urine curvature.  A1c is up a little bit at 7.0.  Did not need to make any changes however I would like to see him back in 3 to 6 months to recheck this.  He should continue to work on diet and exercise.  Vitamin D  is low.  Recommend starting 50,000 IUs weekly for 3 months to be followed by 5000 IUs daily.  I would like for him to come back in 3 to 6 months to recheck vitamin D  level.  All of his other labs are at goal including his testosterone  level.  We can recheck everything in a year or so.

## 2024-11-02 NOTE — Progress Notes (Signed)
 Patient has appointment in 6 months, 05/02/2025. Also patient confirmed via MyChart that he will purchase OTC Vitamin D .

## 2024-11-03 LAB — URINE CULTURE
MICRO NUMBER:: 17502719
SPECIMEN QUALITY:: ADEQUATE

## 2024-11-05 ENCOUNTER — Encounter: Payer: Self-pay | Admitting: Family Medicine

## 2024-11-06 MED ORDER — NITROFURANTOIN MONOHYD MACRO 100 MG PO CAPS: 100.0000 mg | ORAL_CAPSULE | Freq: Two times a day (BID) | ORAL | 0 refills | Status: DC

## 2024-11-06 NOTE — Telephone Encounter (Signed)
 Please see result note

## 2024-11-06 NOTE — Progress Notes (Signed)
 Urine culture shows e coli urinary tract infection. Due to his recent history, we should probably treat this however it would probably be a good idea for him to see a urologist to discuss his recurrent urinary tract infection. Please send in Macrobid  100 mg twice daily x 7 days and refer to urology if he is agreeable.

## 2024-11-07 ENCOUNTER — Other Ambulatory Visit: Payer: Self-pay | Admitting: *Deleted

## 2024-11-07 ENCOUNTER — Ambulatory Visit: Admitting: Sports Medicine

## 2024-11-07 ENCOUNTER — Encounter: Payer: Self-pay | Admitting: Family Medicine

## 2024-11-07 VITALS — BP 120/80 | HR 72 | Ht 72.0 in | Wt 238.0 lb

## 2024-11-07 DIAGNOSIS — M25551 Pain in right hip: Secondary | ICD-10-CM

## 2024-11-07 DIAGNOSIS — I480 Paroxysmal atrial fibrillation: Secondary | ICD-10-CM | POA: Diagnosis not present

## 2024-11-07 DIAGNOSIS — M1611 Unilateral primary osteoarthritis, right hip: Secondary | ICD-10-CM | POA: Diagnosis not present

## 2024-11-07 DIAGNOSIS — R110 Nausea: Secondary | ICD-10-CM

## 2024-11-07 MED ORDER — CEFDINIR 300 MG PO CAPS: 300.0000 mg | ORAL_CAPSULE | Freq: Two times a day (BID) | ORAL | 0 refills | Status: AC

## 2024-11-07 NOTE — Telephone Encounter (Signed)
 We can send in omnicef  300 mg twice daily for 7 days instead if he prefers this.

## 2024-11-07 NOTE — Patient Instructions (Signed)
 Thank you for coming in today  Your hip pain is consistent with moderate osteoarthritis of right hip in an acute flare.  We discussed that typical treatment would include medications such as NSAIDs and prednisone, however I do not recommend NSAIDs or prednisone due to history of atrial fibrillation, DM type II, current anticoagulation on Eliquis .  Recommend asking your A-fib doctor if they are okay with us  performing a intra-articular hip corticosteroid injection at your follow-up visit.  - Use Tylenol  500 to 1000 mg tablets 2-3 times a day for day-to-day pain relief  - Dr. Kennyth discontinued your Macrobid  antibiotic medication and send you a new medication called Omnicef  to your pharmacy.  You may pick up this medication and discontinue Macrobid .  If nausea, vomiting worsen, or if shortness of breath, chest pain present, recommend urgent evaluation at emergency room.  Follow-up in 3 to 4 weeks

## 2024-11-07 NOTE — Progress Notes (Signed)
 "               Odis Mace D.[facility] [address] [place] [address] Phone: [phone]   Assessment and Plan:     1. Right hip pain (Primary) 2. Primary osteoarthritis of right hip - Chronic with exacerbation, initial sports medicine visit - Consistent with flare of moderate osteoarthritis of right hip - Reviewed patient's x-ray in clinic.  Discussed cam deformity, moderate degenerative changes in right femoral acetabular joint - Do not recommend p.o. NSAIDs or prednisone due to past medical history of atrial fibrillation on chronic anticoagulation with Eliquis , DM type II, hypertension - Discussed the possibility of an intra-articular hip CSI, however patient had A-fib ablation procedure performed on 10/25/2024.  Patient has follow-up with cardiology 11/22/2024.  I recommend discussing intra-articular CSI with cardiology on that visit, and if they approved, we could consider CSI at follow-up visit. - Use Tylenol  500 to 1000 mg tablets 2-3 times a day for day-to-day pain relief     3. Nausea without vomiting 4. Paroxysmal atrial fibrillation (HCC) -Acute, complicated, initial visit - Patient had atrial fibrillation ablation performed on 10/25/2024.  Patient started nitrofurantoin  for UTI 2 days ago.  Patient has been experiencing significant nausea for the past 2 to 3 days.  They have been messaging with their primary care provider who this morning advised to discontinue nitrofurantoin  and instead start Omnicef  300 mg twice daily.  I relayed this information to patient and his wife.  They will stop nitrofurantoin  and pick up Omnicef  at the pharmacy - Patient appeared to be in acute distress at today's office visit.  He claims generalized nausea, but denied shortness of breath, chest pain.  Manual pulse check revealed sinus rhythm with abnormal beat every 10-20 beats.  Patient's Apple Watch measured normal sinus rhythm during office visit.  Vital signs within  normal limits.  Advised patient and his wife that patient should be evaluated at ER if symptoms worsen, if chest pain or shortness of breath present.  Pertinent previous records reviewed include right hip x-ray 08/17/2024, lumbar x-ray 11/08/2022   Follow Up: 3-4 weeks for reevaluation.  If cleared by cardiology, could consider intra-articular hip CSI    Subjective:   I, Chestine Reeves, am serving as a neurosurgeon for Doctor Morene Mace  Chief Complaint: right hip pain   HPI:   11/07/24 Patient is a 84 year old male with right hip pain. Patient states pain for about a month. Pain is in the posterior hip when he lays down. Decreased ROM. Pain radiates to the groin and down the leg. He has to sleep in the recliner. Tylenol  seems to help with the pain. Hx of neuropathy. He does use a one prong cain.    Relevant Historical Information: DM type II, atrial fibrillation on chronic anticoagulation with Eliquis , GERD, hypertension  Additional pertinent review of systems negative.  Current Medications[1]   Objective:     Vitals:   11/07/24 1105  BP: 120/80  Pulse: 72  SpO2: 91%  Weight: 238 lb (108 kg)  Height: 6' (1.829 m)   Manual pulse 84 with occasional additional beat every 10-20 beats.   Body mass index is 32.28 kg/m.    Physical Exam:    General: awake, alert, and oriented.  Generalized acute distress with nausea. Skin: no suspicious lesions or rashes Neuro:sensation intact distally with no deficits, normal muscle tone, no atrophy, strength 5/5 in all tested lower ext groups Psych: normal  mood and affect, speech clear   Right hip: No deformity, swelling or wasting ROM Flexion 90, ext 30, IR 45, ER 45 NTTP over the hip flexors, greater trochanter, gluteal musculature, si joint, lumbar spine Positive log roll with FROM Positive FABER Positive FADIR Negative Piriformis test for radicular symptoms, though positioning created anterior hip pain Gait antalgic, favoring  left leg   Electronically signed by:  Odis Mace D.[facility] 11:47 AM 11/07/24     [1]  Current Outpatient Medications:    acetaminophen  (TYLENOL ) 650 MG CR tablet, Take 650-1,300 mg by mouth daily as needed for pain., Disp: , Rfl:    albuterol  (VENTOLIN  HFA) 108 (90 Base) MCG/ACT inhaler, Inhale 2 puffs into the lungs every 6 (six) hours as needed for wheezing or shortness of breath., Disp: 8 g, Rfl: 0   amiodarone  (PACERONE ) 200 MG tablet, Take 2 tablets (400 mg total) by mouth 2 (two) times daily for 7 days, THEN 1 tablet (200 mg total) 2 (two) times daily for 7 days, THEN 1 tablet (200 mg total) daily. (Patient taking differently: 1 tablet (200 mg total) daily.), Disp: 110 tablet, Rfl: 3   Continuous Glucose Sensor (FREESTYLE LIBRE 3 SENSOR) MISC, Place 1 sensor on the skin Q14 days. Use to check glucose continuously Code E11.6, Disp: 2 each, Rfl: 5   cyanocobalamin  (VITAMIN B12) 1000 MCG tablet, Take 1,000 mcg by mouth once a week., Disp: , Rfl:    ELIQUIS  5 MG TABS tablet, TAKE 1 TABLET BY MOUTH 2 TIMES A DAY, Disp: 60 tablet, Rfl: 5   fluticasone (FLONASE) 50 MCG/ACT nasal spray, Place 1 spray into both nostrils daily as needed for allergies or rhinitis., Disp: , Rfl:    furosemide  (LASIX ) 20 MG tablet, Take 1 tablet (20 mg total) by mouth daily., Disp: 30 tablet, Rfl: 3   losartan  (COZAAR ) 25 MG tablet, Take 1 tablet (25 mg total) by mouth daily., Disp: 90 tablet, Rfl: 3   pantoprazole  (PROTONIX ) 40 MG tablet, Take 1 tablet (40 mg total) by mouth daily. Office visit for further refills, Disp: 90 tablet, Rfl: 0   pravastatin  (PRAVACHOL ) 80 MG tablet, TAKE ONE TABLET BY MOUTH EVERY EVENING, Disp: 90 tablet, Rfl: 3   cefdinir  (OMNICEF ) 300 MG capsule, Take 1 capsule (300 mg total) by mouth 2 (two) times daily for 7 days., Disp: 14 capsule, Rfl: 0   tadalafil  (CIALIS ) 20 MG tablet, Take 0.5-1 tablets (10-20 mg total) by mouth every other day as needed for erectile  dysfunction., Disp: 10 tablet, Rfl: 11   traMADol  (ULTRAM ) 50 MG tablet, Take 1 tablet (50 mg total) by mouth daily as needed for moderate pain (pain score 4-6) or severe pain (pain score 7-10)., Disp: 30 tablet, Rfl: 5  "

## 2024-11-07 NOTE — Telephone Encounter (Signed)
 Please advise.

## 2024-11-09 ENCOUNTER — Other Ambulatory Visit: Payer: Self-pay | Admitting: *Deleted

## 2024-11-09 DIAGNOSIS — N390 Urinary tract infection, site not specified: Secondary | ICD-10-CM

## 2024-11-15 ENCOUNTER — Other Ambulatory Visit

## 2024-11-15 DIAGNOSIS — N390 Urinary tract infection, site not specified: Secondary | ICD-10-CM | POA: Diagnosis not present

## 2024-11-15 LAB — URINALYSIS, ROUTINE W REFLEX MICROSCOPIC
Bilirubin Urine: NEGATIVE
Hgb urine dipstick: NEGATIVE
Ketones, ur: NEGATIVE
Leukocytes,Ua: NEGATIVE
Nitrite: NEGATIVE
RBC / HPF: NONE SEEN
Specific Gravity, Urine: 1.025 (ref 1.000–1.030)
Total Protein, Urine: NEGATIVE
Urine Glucose: NEGATIVE
Urobilinogen, UA: 0.2 (ref 0.0–1.0)
pH: 6 (ref 5.0–8.0)

## 2024-11-16 LAB — URINE CULTURE
MICRO NUMBER:: 17553684
Result:: NO GROWTH
SPECIMEN QUALITY:: ADEQUATE

## 2024-11-22 ENCOUNTER — Ambulatory Visit (HOSPITAL_COMMUNITY): Admitting: Physician Assistant

## 2024-11-28 ENCOUNTER — Ambulatory Visit: Admitting: Sports Medicine

## 2024-12-31 ENCOUNTER — Ambulatory Visit

## 2025-01-24 ENCOUNTER — Ambulatory Visit: Admitting: Physician Assistant

## 2025-05-02 ENCOUNTER — Ambulatory Visit: Admitting: Family Medicine
